# Patient Record
Sex: Female | Born: 1980
Health system: Southern US, Community
[De-identification: ages and names within clinical notes are randomized; demographics above are authoritative.]

## PROBLEM LIST (undated history)

## (undated) DIAGNOSIS — T8859XA Other complications of anesthesia, initial encounter: Secondary | ICD-10-CM

## (undated) DIAGNOSIS — J309 Allergic rhinitis, unspecified: Secondary | ICD-10-CM

## (undated) DIAGNOSIS — T7840XA Allergy, unspecified, initial encounter: Secondary | ICD-10-CM

## (undated) DIAGNOSIS — E119 Type 2 diabetes mellitus without complications: Secondary | ICD-10-CM

## (undated) DIAGNOSIS — F329 Major depressive disorder, single episode, unspecified: Secondary | ICD-10-CM

## (undated) DIAGNOSIS — F411 Generalized anxiety disorder: Secondary | ICD-10-CM

## (undated) DIAGNOSIS — K219 Gastro-esophageal reflux disease without esophagitis: Secondary | ICD-10-CM

## (undated) DIAGNOSIS — D649 Anemia, unspecified: Secondary | ICD-10-CM

## (undated) DIAGNOSIS — N83209 Unspecified ovarian cyst, unspecified side: Secondary | ICD-10-CM

## (undated) DIAGNOSIS — Z8719 Personal history of other diseases of the digestive system: Secondary | ICD-10-CM

## (undated) DIAGNOSIS — M797 Fibromyalgia: Secondary | ICD-10-CM

## (undated) DIAGNOSIS — Z87442 Personal history of urinary calculi: Secondary | ICD-10-CM

## (undated) DIAGNOSIS — G43009 Migraine without aura, not intractable, without status migrainosus: Secondary | ICD-10-CM

## (undated) DIAGNOSIS — I499 Cardiac arrhythmia, unspecified: Secondary | ICD-10-CM

## (undated) DIAGNOSIS — E669 Obesity, unspecified: Secondary | ICD-10-CM

## (undated) DIAGNOSIS — F32A Depression, unspecified: Secondary | ICD-10-CM

## (undated) DIAGNOSIS — F909 Attention-deficit hyperactivity disorder, unspecified type: Secondary | ICD-10-CM

## (undated) DIAGNOSIS — M1990 Unspecified osteoarthritis, unspecified site: Secondary | ICD-10-CM

## (undated) HISTORY — DX: Allergic rhinitis, unspecified: J30.9

## (undated) HISTORY — DX: Gastro-esophageal reflux disease without esophagitis: K21.9

## (undated) HISTORY — DX: Migraine without aura, not intractable, without status migrainosus: G43.009

## (undated) HISTORY — DX: Unspecified ovarian cyst, unspecified side: N83.209

## (undated) HISTORY — DX: Type 2 diabetes mellitus without complications: E11.9

## (undated) HISTORY — DX: Obesity, unspecified: E66.9

## (undated) HISTORY — DX: Depression, unspecified: F32.A

## (undated) HISTORY — DX: Attention-deficit hyperactivity disorder, unspecified type: F90.9

## (undated) HISTORY — DX: Anemia, unspecified: D64.9

## (undated) HISTORY — DX: Allergy, unspecified, initial encounter: T78.40XA

## (undated) HISTORY — DX: Generalized anxiety disorder: F41.1

## (undated) HISTORY — PX: NO PAST SURGERIES: SHX2092

## (undated) HISTORY — DX: Fibromyalgia: M79.7

## (undated) HISTORY — DX: Major depressive disorder, single episode, unspecified: F32.9

---
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
## 2009-11-15 ENCOUNTER — Ambulatory Visit: Payer: Self-pay | Admitting: Internal Medicine

## 2009-11-15 LAB — CONVERTED CEMR LAB
ALT: 16 U/L
AST: 14 U/L
Albumin: 3.4 g/dL — ABNORMAL LOW
Alkaline Phosphatase: 100 U/L
BUN: 11 mg/dL
Basophils Absolute: 0 K/uL
Basophils Relative: 0.4 %
Bilirubin Urine: NEGATIVE
Bilirubin, Direct: 0 mg/dL
CO2: 28 meq/L
Calcium: 9.5 mg/dL
Chloride: 102 meq/L
Cholesterol: 184 mg/dL
Creatinine, Ser: 0.7 mg/dL
Eosinophils Absolute: 0.2 K/uL
Eosinophils Relative: 2.3 %
GFR calc non Af Amer: 103.66 mL/min
Glucose, Bld: 127 mg/dL — ABNORMAL HIGH
HCT: 34.9 % — ABNORMAL LOW
HDL: 78 mg/dL
Hemoglobin, Urine: NEGATIVE
Hemoglobin: 11.4 g/dL — ABNORMAL LOW
Ketones, ur: NEGATIVE mg/dL
LDL Cholesterol: 74 mg/dL
Leukocytes, UA: NEGATIVE
Lymphocytes Relative: 20.9 %
Lymphs Abs: 2.1 K/uL
MCHC: 32.8 g/dL
MCV: 77.1 fL — ABNORMAL LOW
Monocytes Absolute: 0.7 K/uL
Monocytes Relative: 6.9 %
Neutro Abs: 7 K/uL
Neutrophils Relative %: 69.5 %
Nitrite: NEGATIVE
Platelets: 425 K/uL — ABNORMAL HIGH
Potassium: 4 meq/L
RBC: 4.53 M/uL
RDW: 16.1 % — ABNORMAL HIGH
Sodium: 139 meq/L
Specific Gravity, Urine: >=1.03
TSH: 3.04 u[IU]/mL
Total Bilirubin: 0.3 mg/dL
Total CHOL/HDL Ratio: 2
Total Protein, Urine: NEGATIVE mg/dL
Total Protein: 7.3 g/dL
Triglycerides: 158 mg/dL — ABNORMAL HIGH
Urine Glucose: NEGATIVE mg/dL
Urobilinogen, UA: 0.2
VLDL: 31.6 mg/dL
WBC: 10 10*3/microliter
pH: 6

## 2009-11-20 ENCOUNTER — Ambulatory Visit: Payer: Self-pay | Admitting: Internal Medicine

## 2009-11-20 ENCOUNTER — Telehealth: Payer: Self-pay | Admitting: Internal Medicine

## 2009-11-20 DIAGNOSIS — E669 Obesity, unspecified: Secondary | ICD-10-CM | POA: Insufficient documentation

## 2009-11-20 DIAGNOSIS — G43009 Migraine without aura, not intractable, without status migrainosus: Secondary | ICD-10-CM | POA: Insufficient documentation

## 2009-11-20 DIAGNOSIS — Z6830 Body mass index (BMI) 30.0-30.9, adult: Secondary | ICD-10-CM | POA: Insufficient documentation

## 2009-11-20 DIAGNOSIS — E1149 Type 2 diabetes mellitus with other diabetic neurological complication: Secondary | ICD-10-CM | POA: Insufficient documentation

## 2009-11-20 LAB — CONVERTED CEMR LAB
Creatinine,U: 99.7 mg/dL
Hgb A1c MFr Bld: 6.6 % — ABNORMAL HIGH (ref 4.6–6.5)

## 2009-11-21 DIAGNOSIS — J309 Allergic rhinitis, unspecified: Secondary | ICD-10-CM | POA: Insufficient documentation

## 2009-11-21 DIAGNOSIS — N83209 Unspecified ovarian cyst, unspecified side: Secondary | ICD-10-CM | POA: Insufficient documentation

## 2009-11-21 DIAGNOSIS — Z87448 Personal history of other diseases of urinary system: Secondary | ICD-10-CM | POA: Insufficient documentation

## 2010-01-22 ENCOUNTER — Ambulatory Visit: Payer: Self-pay | Admitting: Internal Medicine

## 2010-02-20 ENCOUNTER — Ambulatory Visit: Payer: Self-pay | Admitting: Internal Medicine

## 2010-02-20 DIAGNOSIS — F419 Anxiety disorder, unspecified: Secondary | ICD-10-CM | POA: Insufficient documentation

## 2010-02-20 LAB — CONVERTED CEMR LAB: Hgb A1c MFr Bld: 6.4 % (ref 4.6–6.5)

## 2010-04-04 ENCOUNTER — Ambulatory Visit: Payer: Self-pay | Admitting: Internal Medicine

## 2010-04-22 ENCOUNTER — Telehealth: Payer: Self-pay | Admitting: Internal Medicine

## 2010-05-09 ENCOUNTER — Telehealth: Payer: Self-pay | Admitting: Internal Medicine

## 2010-05-22 ENCOUNTER — Ambulatory Visit: Payer: Self-pay | Admitting: Internal Medicine

## 2010-09-09 ENCOUNTER — Other Ambulatory Visit: Payer: Self-pay | Admitting: Internal Medicine

## 2010-09-09 ENCOUNTER — Ambulatory Visit
Admission: RE | Admit: 2010-09-09 | Discharge: 2010-09-09 | Payer: Self-pay | Source: Home / Self Care | Attending: Internal Medicine | Admitting: Internal Medicine

## 2010-09-09 DIAGNOSIS — L659 Nonscarring hair loss, unspecified: Secondary | ICD-10-CM | POA: Insufficient documentation

## 2010-09-09 LAB — CBC WITH DIFFERENTIAL/PLATELET
Basophils Relative: 0.5 % (ref 0.0–3.0)
Eosinophils Absolute: 0.3 10*3/uL (ref 0.0–0.7)
HCT: 37.8 % (ref 36.0–46.0)
Hemoglobin: 12.6 g/dL (ref 12.0–15.0)
Lymphocytes Relative: 22.5 % (ref 12.0–46.0)
MCHC: 33.4 g/dL (ref 30.0–36.0)
Monocytes Relative: 5.3 % (ref 3.0–12.0)
Neutro Abs: 5.8 10*3/uL (ref 1.4–7.7)
RBC: 4.52 Mil/uL (ref 3.87–5.11)

## 2010-09-09 LAB — HEMOGLOBIN A1C: Hgb A1c MFr Bld: 6.1 % (ref 4.6–6.5)

## 2010-09-10 NOTE — Assessment & Plan Note (Signed)
Summary: ANXIETY/NWS  #   Vital Signs:  Patient profile:   30 year old female Height:      64 inches (162.56 cm) Weight:      184.8 pounds (84 kg) O2 Sat:      97 % on Room air Temp:     98.2 degrees F (36.78 degrees C) oral Pulse rate:   121 / minute BP sitting:   110 / 72  (left arm) Cuff size:   large  Vitals Entered By: Orlan Leavens (February 20, 2010 8:27 AM)  O2 Flow:  Room air CC: Anxiety Is Patient Diabetic? Yes Did you bring your meter with you today? No Pain Assessment Patient in pain? no        Primary Care Provider:  Newt Lukes MD  CC:  Anxiety.  History of Present Illness: here for increase anxiety symptoms - hx same (see below) related to increase stress - dad dx with cancer, separation from spouse and moved in with sister, work denies /si but +palp and insomnia - freq uncontrolled tearfulness - no appetite and assoc weight loss prev intol of zoloft - weight gain; ok on celexa, good with short term xanax and klonopin  also review prior OV/hx: 1) DM2 -  dx 05/2009 - a1c 7.3 then 6.9 after initiating metformin - +FHx - checks cbg at home two times a day: 120-170 range - has met with nutrtion counseling but not making much change - takes meds 100% and no adv SE on metformin or actos  2) obesity - exercise and diet efforts ongoing - 23# weight loss since initiating same -   3) migraines - none since last OV - hx occurs cycles every 3-4 mo, will have several in row then none til next cycle - prev controlled with zomig but changed to maxalt by insurance - no relief with imitrex, doing well with one relpax use since last OV - prev used "cocktail" of darvocet + phenergan when having bad spell - also good relief with tordol - never tried tramadol/ultracet - no n/v or prodromal symptoms - no current symptoms - worse with stress and poor sleep  4) anxiety - usually controls symptoms with counseling and biofeedback - worse with stress from work and family (see above)  - feels much improved with ACEI and lower BP --  Clinical Review Panels:  Lipid Management   Cholesterol:  184 (11/15/2009)   LDL (bad choesterol):  74 (11/15/2009)   HDL (good cholesterol):  78.00 (11/15/2009)  Diabetes Management   HgBA1C:  6.6 (11/20/2009)   Creatinine:  0.7 (11/15/2009)   Last Pneumovax:  Historical (08/11/2008)  CBC   WBC:  10.0 (11/15/2009)   RBC:  4.53 (11/15/2009)   Hgb:  11.4 (11/15/2009)   Hct:  34.9 (11/15/2009)   Platelets:  425.0 (11/15/2009)   MCV  77.1 (11/15/2009)   MCHC  32.8 (11/15/2009)   RDW  16.1 (11/15/2009)   PMN:  69.5 (11/15/2009)   Lymphs:  20.9 (11/15/2009)   Monos:  6.9 (11/15/2009)   Eosinophils:  2.3 (11/15/2009)   Basophil:  0.4 (11/15/2009)  Complete Metabolic Panel   Glucose:  127 (11/15/2009)   Sodium:  139 (11/15/2009)   Potassium:  4.0 (11/15/2009)   Chloride:  102 (11/15/2009)   CO2:  28 (11/15/2009)   BUN:  11 (11/15/2009)   Creatinine:  0.7 (11/15/2009)   Albumin:  3.4 (11/15/2009)   Total Protein:  7.3 (11/15/2009)   Calcium:  9.5 (11/15/2009)   Total  Bili:  0.3 (11/15/2009)   Alk Phos:  100 (11/15/2009)   SGPT (ALT):  16 (11/15/2009)   SGOT (AST):  14 (11/15/2009)   Current Medications (verified): 1)  Tri-Previfem 0.18/0.215/0.25 Mg-35 Mcg Tabs (Norgestim-Eth Estrad Triphasic) 2)  Metformin Hcl 1000 Mg Tabs (Metformin Hcl) .[name].. 1 By Mouth Two Times A Day 3)  Vitamin D3 1000 Unit Tabs (Cholecalciferol) 4)  Relpax 40 Mg Tabs (Eletriptan Hydrobromide) .[name].. 1 By Mouth As Needed For Migraines 5)  Promethazine Hcl 25 Mg Tabs (Promethazine Hcl) .[name].. 1 By Mouth Q 6h As Needed 6)  Triamcinolone Acetonide 0.1 % Crea (Triamcinolone Acetonide) .... Apply 2-3x Prn 7)  Truetest Test  Strp (Glucose Blood) .... Use As Directed Two Times A Day 8)  Actos 15 Mg Tabs (Pioglitazone Hcl) .[name].. 1 By Mouth Once Daily 9)  Lisinopril 10 Mg Tabs (Lisinopril) .[name].. 1 By Mouth Once Daily 10)  Ultracet 37.5-325 Mg Tabs  (Tramadol-Acetaminophen) .[name].. 1 By Mouth Every 6 Houyrs As Needed For Headache Pain 11)  Aspirin 81 Mg Tabs (Aspirin) .... Take 1 By Mouth Once Daily 12)  Iron 325 (65 Fe) Mg Tabs (Ferrous Sulfate) .... 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  The patient denies fever, chest pain, peripheral edema, and headaches.    Physical Exam  General:  overweight-appearing.  alert, well-developed, well-nourished, and cooperative to examination.   sad and tearful Lungs:  normal respiratory effort, no intercostal retractions or use of accessory muscles; normal breath sounds bilaterally - no crackles and no wheezes.    Heart:  normal rate, regular rhythm, no murmur, and no rub. BLE without edema.  Psych:  Oriented X3, memory intact for recent and remote, normally interactive, good eye contact, mod anxious appearing, very depressed appearing, min agitated.      Impression & Recommendations:  Problem # 1:  ANXIETY (ICD-300.00)  long hx same - now exac with situational stressors (dad illness, spouse separation and move, work) resume ssi - pt prefers re-try on celexa ok for as needed bz - xanax daytime and klonopin at bedtime  counseling and support provided - Time spent with patient 25 minutes, more than 50% of this time was spent on same close f/u in next few weeks here and continue attending therapy - Her updated medication list for this problem includes:    Celexa 20 Mg Tabs (Citalopram hydrobromide) .[name]... 1 by mouth once daily    Alprazolam 0.5 Mg  Tabs (Alprazolam) .[name]... 1 by mouth three times a day as needed for anxiety    Clonazepam 0.5 Mg Tabs (Clonazepam) .[name]... 1 by mouth at bedtime as needed for sleep  Orders: Prescription Created Electronically [phone])  Problem # 2:  DIABETES MELLITUS, TYPE II (ICD-250.00)  Her updated medication list for this problem includes:    Metformin Hcl 1000 Mg Tabs (Metformin hcl) .[name]... 1 by mouth two times a day    Actos 15 Mg Tabs (Pioglitazone hcl) .[name]... 1 by mouth once daily    Lisinopril 10 Mg Tabs (Lisinopril) .[name]... 1 by mouth once daily    Aspirin  81 Mg Tabs (Aspirin) .[name]... Take 1 by mouth once daily  home log reviewed -  - no hypoglycemia - cont metformin and actos as ongoing - also cont ACEI lipids ok cont carb counting  as ongoing -  Labs Reviewed: Creat: 0.7 (11/15/2009)    Reviewed HgBA1c results: 6.6 (11/20/2009)  Complete Medication List: 1)  Tri-previfem 0.18/0.215/0.25 Mg-35 Mcg Tabs (Norgestim-eth estrad triphasic) 2)  Metformin Hcl 1000 Mg Tabs (Metformin hcl) .[name].. 1 by mouth two times a day 3)  Vitamin D3 1000 Unit Tabs (Cholecalciferol) 4)  Relpax 40 Mg Tabs (Eletriptan hydrobromide) .[name].. 1 by mouth as needed for migraines 5)  Promethazine Hcl 25 Mg Tabs (Promethazine hcl) .[name].. 1 by mouth q 6h as needed 6)  Triamcinolone Acetonide 0.1 % Crea (Triamcinolone acetonide) .... Apply 2-3x prn 7)  Truetest Test Strp (Glucose blood) .... Use as directed two times a day 8)  Actos 15 Mg Tabs (Pioglitazone hcl) .[name].. 1 by mouth once daily 9)  Lisinopril 10 Mg Tabs (Lisinopril) .[name].. 1 by mouth once daily 10)  Ultracet 37.5-325 Mg Tabs (Tramadol-acetaminophen) .[name].. 1 by mouth every 6 houyrs as needed for headache pain 11)  Aspirin 81 Mg Tabs (Aspirin) .... Take 1 by mouth once daily 12)  Iron 325 (65 Fe) Mg Tabs (Ferrous sulfate) .... Take 1 by mouth once daily 13)  Celexa 20 Mg Tabs (Citalopram hydrobromide) .[name].. 1 by mouth once daily 14)  Alprazolam 0.5 Mg Tabs (Alprazolam) .[name].. 1  by mouth three times a day as needed for anxiety 15)  Clonazepam 0.5 Mg Tabs (Clonazepam) .[name].. 1 by mouth at bedtime as needed for sleep  Patient Instructions: 1)  it was good to see you today. 2)  start generic celexa for anxiety - also xanax and klonopin as discussed - 3)  90day on lisinopril and actose to [facility] - done 4)  Please schedule a follow-up appointment in 4-6 weeks, sooner if problems.  Prescriptions: LISINOPRIL 10 MG TABS (LISINOPRIL) 1 by mouth once daily  #90 x 3   Entered by:   Orlan Leavens   Authorized by:   Newt Lukes MD   Signed by:   Orlan Leavens on 02/20/2010   Method used:   Faxed to ...       [facility] (mail-order)             , [place]         Ph: [phone]       Fax: [phone]   RxID:   [id number] ACTOS 15 MG TABS (PIOGLITAZONE HCL) 1 by mouth once daily  #90 x 3   Entered by:   Orlan Leavens   Authorized by:   Newt Lukes MD   Signed by:   Orlan Leavens on 02/20/2010   Method used:   Faxed to ...       [facility] (mail-order)             , [place]         Ph: [phone]       Fax: [phone]   RxID:   [id number] CLONAZEPAM 0.5 MG TABS (CLONAZEPAM) 1 by mouth at bedtime as needed for sleep  #30 x 1   Entered and Authorized by:   Newt Lukes MD   Signed by:   Newt Lukes MD on 02/20/2010   Method used:   Print then Give to Patient   RxID:   [id number] ALPRAZOLAM 0.5 MG TABS (ALPRAZOLAM) 1 by mouth three  times a day as needed for anxiety  #30 x 1   Entered and Authorized by:   Newt Lukes MD   Signed by:   Newt Lukes MD on 02/20/2010   Method used:   Print then Give to Patient   RxID:   [id number] CELEXA 20 MG TABS (CITALOPRAM HYDROBROMIDE) 1 by mouth once daily  #30 x 1   Entered and Authorized by:   Newt Lukes MD   Signed by:   Newt Lukes MD on 02/20/2010   Method used:   Electronically to        [id number] (retail)       [address]       [place], [place]  [address]       Ph: [phone] or [phone]       Fax: [phone]   RxID:   [id number]

## 2010-09-10 NOTE — Assessment & Plan Note (Signed)
Summary: 4-6 wk fu  stc   Vital Signs:  Patient profile:   30 year old female Height:      64 inches (162.56 cm) Weight:      173.8 pounds (79 kg) O2 Sat:      98 % on Room air Temp:     97.5 degrees F (36.39 degrees C) oral Pulse rate:   96 / minute BP sitting:   100 / 72  (left arm) Cuff size:   large  Vitals Entered By: Orlan Leavens RMA (April 04, 2010 8:38 AM)  O2 Flow:  Room air CC: 4 week follow-up Is Patient Diabetic? Yes Did you bring your meter with you today? No Pain Assessment Patient in pain? no        Primary Care Provider:  Newt Lukes MD  CC:  4 week follow-up.  History of Present Illness: here for follow up anxiety symptoms - hx same (see below) related to increase stress - dad dx with cancer, separation from spouse and moved in with sister, work denies /si but +palp and insomnia - freq uncontrolled tearfulness - no appetite and assoc weight loss prev intol of zoloft - weight gain; ok on celexa, good with short term xanax and klonopin  also review prior OV/hx: 1) DM2 -  dx 05/2009 - a1c 7.3 then 6.9 after initiating metformin - +FHx - checks cbg at home two times a day: 120-170 range - has met with nutrtion counseling but not making much change - takes meds 100% and no adv SE on metformin or actos  2) obesity - exercise and diet efforts ongoing - 23# weight loss since initiating same -   3) migraines - none since last OV - hx occurs cycles every 3-4 mo, will have several in row then none til next cycle - prev controlled with zomig but changed to maxalt by insurance - no relief with imitrex, doing well with one relpax use since last OV - prev used "cocktail" of darvocet + phenergan when having bad spell - also good relief with tordol - never tried tramadol/ultracet - no n/v or prodromal symptoms - no current symptoms - worse with stress and poor sleep  4) anxiety - usually controls symptoms with counseling and biofeedback - worse with stress from  work and family (see above) - feels much improved with ACEI and lower BP --  Clinical Review Panels:  Diabetes Management   HgBA1C:  6.4 (02/20/2010)   Creatinine:  0.7 (11/15/2009)   Last Pneumovax:  Historical (08/11/2008)  CBC   WBC:  10.0 (11/15/2009)   RBC:  4.53 (11/15/2009)   Hgb:  11.4 (11/15/2009)   Hct:  34.9 (11/15/2009)   Platelets:  425.0 (11/15/2009)   MCV  77.1 (11/15/2009)   MCHC  32.8 (11/15/2009)   RDW  16.1 (11/15/2009)   PMN:  69.5 (11/15/2009)   Lymphs:  20.9 (11/15/2009)   Monos:  6.9 (11/15/2009)   Eosinophils:  2.3 (11/15/2009)   Basophil:  0.4 (11/15/2009)  Complete Metabolic Panel   Glucose:  127 (11/15/2009)   Sodium:  139 (11/15/2009)   Potassium:  4.0 (11/15/2009)   Chloride:  102 (11/15/2009)   CO2:  28 (11/15/2009)   BUN:  11 (11/15/2009)   Creatinine:  0.7 (11/15/2009)   Albumin:  3.4 (11/15/2009)   Total Protein:  7.3 (11/15/2009)   Calcium:  9.5 (11/15/2009)   Total Bili:  0.3 (11/15/2009)   Alk Phos:  100 (11/15/2009)   SGPT (ALT):  16 (  11/15/2009)   SGOT (AST):  14 (11/15/2009)   Current Medications (verified): 1)  Tri-Previfem 0.18/0.215/0.25 Mg-35 Mcg Tabs (Norgestim-Eth Estrad Triphasic) 2)  Metformin Hcl 1000 Mg Tabs (Metformin Hcl) .[name].. 1 By Mouth Two Times A Day 3)  Vitamin D3 1000 Unit Tabs (Cholecalciferol) 4)  Relpax 40 Mg Tabs (Eletriptan Hydrobromide) .[name].. 1 By Mouth As Needed For Migraines 5)  Promethazine Hcl 25 Mg Tabs (Promethazine Hcl) .[name].. 1 By Mouth Q 6h As Needed 6)  Triamcinolone Acetonide 0.1 % Crea (Triamcinolone Acetonide) .... Apply 2-3x Prn 7)  Truetest Test  Strp (Glucose Blood) .... Use As Directed Two Times A Day 8)  Actos 15 Mg Tabs (Pioglitazone Hcl) .[name].. 1 By Mouth Once Daily 9)  Lisinopril 10 Mg Tabs (Lisinopril) .[name].. 1 By Mouth Once Daily 10)  Ultracet 37.5-325 Mg Tabs (Tramadol-Acetaminophen) .[name].. 1 By Mouth Every 6 Houyrs As Needed For Headache Pain 11)  Aspirin 81 Mg Tabs (Aspirin) .... Take 1  By Mouth Once Daily 12)  Iron 325 (65 Fe) Mg Tabs (Ferrous Sulfate) .... Take 1 By Mouth Once Daily 13)  Celexa 20 Mg Tabs (Citalopram Hydrobromide) .[name].. 1 By Mouth Once Daily 14)  Alprazolam 0.5 Mg Tabs (Alprazolam) .[name].. 1 By Mouth Three Times A Day As Needed For Anxiety 15)  Clonazepam 0.5 Mg Tabs (Clonazepam) .[name].. 1 By Mouth At Bedtime As Needed For Sleep  Allergies (verified): No Known Drug Allergies  Past History:  Past Medical History: Diabetes mellitus, type II obesity depression/anxiety anemia, mild dyslipidemia migraine headaches GERD   allergic rhinitis  MD roster: gyn- stambaugh -high pt  Review of Systems  The patient denies anorexia, fever, chest pain, and headaches.    Physical Exam  General:  overweight-appearing.  alert, well-developed, well-nourished, and cooperative to examination.   sad and tearful Lungs:  normal respiratory effort, no intercostal retractions or use of accessory muscles; normal breath sounds bilaterally - no crackles and no wheezes.    Heart:  normal rate, regular rhythm, no murmur, and no rub. BLE without edema.  Psych:  Oriented X3, memory intact for recent and remote, normally interactive, good eye contact, mild anxious appearing, mild depressed appearing, min agitated.      Impression & Recommendations:  Problem # 1:  ANXIETY (ICD-300.00)  Her updated medication list for this problem includes:    Celexa 20 Mg Tabs (Citalopram hydrobromide) .[name]... 1 by mouth once daily    Alprazolam 0.5 Mg Tabs (Alprazolam) .[name]... 1 by mouth three times a day as needed for anxiety    Clonazepam 0.5 Mg Tabs (Clonazepam) .[name]... 1 by mouth at bedtime as needed for sleep  long hx same - now exac with situational stressors (dad illness, spouse separation and move, work) improved on resumed celexa - cont same ok to cont as needed bz - xanax daytime and klonopin at bedtime  counseling and support provided -   Problem # 2:  DIABETES MELLITUS, TYPE II  (ICD-250.00)  Her updated medication list for this problem includes:    Metformin Hcl 1000 Mg Tabs (Metformin hcl) .[name]... 1 by mouth two times a day    Actos 15 Mg Tabs (Pioglitazone hcl) .[name]... 1 by mouth once daily    Lisinopril 10 Mg Tabs (Lisinopril) .[name]... 1 by mouth once daily    Aspirin 81 Mg Tabs (Aspirin) .[name]... Take 1 by mouth once daily  home log reviewed -  few hypoglycemia - likely related to dec ontake and wt loss - cont metformin and actos  as ongoing - also cont ACEI lipids ok cont carb counting  Labs Reviewed: Creat: 0.7 (11/15/2009)    Reviewed HgBA1c results: 6.4 (02/20/2010)  6.6 (11/20/2009)  Complete Medication List: 1)  Tri-previfem 0.18/0.215/0.25 Mg-35 Mcg Tabs (Norgestim-eth estrad triphasic) 2)  Metformin Hcl 1000 Mg Tabs (Metformin hcl) .[name].. 1 by mouth two times a day 3)  Vitamin D3 1000 Unit Tabs (Cholecalciferol) 4)  Relpax 40 Mg Tabs (Eletriptan hydrobromide) .[name].. 1 by mouth as needed for migraines 5)  Promethazine Hcl 25 Mg Tabs (Promethazine hcl) .[name].. 1 by mouth q 6h as needed 6)  Triamcinolone Acetonide 0.1 % Crea (Triamcinolone acetonide) .... Apply 2-3x prn 7)  Truetest Test Strp (Glucose blood) .... Use as directed two times a day 8)  Actos 15 Mg Tabs (Pioglitazone hcl) .[name].. 1 by mouth once daily 9)  Lisinopril 10 Mg Tabs (Lisinopril) .[name].. 1 by mouth once daily 10)  Ultracet 37.5-325 Mg Tabs (Tramadol-acetaminophen) .[name].. 1 by mouth every 6 houyrs as needed for headache pain 11)  Aspirin 81 Mg Tabs (Aspirin) .... Take 1 by mouth once daily 12)  Iron 325 (65 Fe) Mg Tabs (Ferrous sulfate) .... Take 1 by mouth once daily 13)  Celexa 20 Mg Tabs (Citalopram hydrobromide) .[name].. 1 by mouth once daily 14)  Alprazolam 0.5 Mg Tabs (Alprazolam) .[name].. 1 by mouth three times a day as needed for anxiety 15)  Clonazepam 0.5 Mg Tabs (Clonazepam) .[name].. 1 by mouth at bedtime as needed for sleep  Patient Instructions: 1)  it was good to see you today. 2)  continue same  dose generic celexa for anxiety - also xanax and klonopin as discussed - 3)  Please keep follow-up appointment as scheduled in october, sooner if problems.  Prescriptions: CLONAZEPAM 0.5 MG TABS (CLONAZEPAM) 1 by mouth at bedtime as needed for sleep  #30 x 1   Entered and Authorized by:   Newt Lukes MD   Signed by:   Newt Lukes MD on 04/04/2010   Method used:   Print then Give to Patient   RxID:   [id number] ALPRAZOLAM 0.5 MG TABS (ALPRAZOLAM) 1 by mouth three times a day as needed for anxiety  #30 x 1   Entered and Authorized by:   Newt Lukes MD   Signed by:   Newt Lukes MD on 04/04/2010   Method used:   Print then Give to Patient   RxID:   [id number] CELEXA 20 MG TABS (CITALOPRAM HYDROBROMIDE) 1 by mouth once daily  #30 x 6   Entered and Authorized by:   Newt Lukes MD   Signed by:   Newt Lukes MD on 04/04/2010   Method used:   Electronically to        [facility] (retail)       [address]       [place], [place]  [address]       Ph: [phone] or [phone]       Fax: [phone]   RxID:   [id number]

## 2010-09-10 NOTE — Progress Notes (Signed)
Summary: Hypoglycemia  Phone Note Call from Patient Call back at Home Phone [phone]   Caller: Mom Summary of Call: Pt called stating that she had a hypoglycemic evant this morningt CBG 64 and pt had a "hadr time moving". Pt says she had some juice and eventually felt better but she would like to know what she can do in the future if this happens again and ways to avoid lows. Please advise. Initial call taken by: Margaret Pyle, CMA,  April 22, 2010 10:53 AM  Follow-up for Phone Call        ask pt to stop actos - likely does not need as much DM medication given her successful weight loss - if recurs, treat herself as she did - juice, snack - and let us know - we will keep backing off DM medication as we are able to do so - thanks Follow-up by: Newt Lukes MD,  April 22, 2010 11:45 AM  Additional Follow-up for Phone Call Additional follow up Details #1::        Pt advised via VM, told to call back with any further questions or concerns Additional Follow-up by: Margaret Pyle, CMA,  April 22, 2010 1:03 PM    New/Updated Medications: ACTOS 15 MG TABS (PIOGLITAZONE HCL) 1 by mouth once daily - HOLD

## 2010-09-10 NOTE — Assessment & Plan Note (Signed)
Summary: 2-3 mth fu  stc   Vital Signs:  Patient profile:   30 year old female Height:      64 inches (162.56 cm) Weight:      194.2 pounds (88.27 kg) O2 Sat:      97 % on Room air Temp:     98.4 degrees F (36.89 degrees C) oral Pulse rate:   104 / minute BP sitting:   102 / 76  (left arm) Cuff size:   large  Vitals Entered By: Orlan Leavens (January 22, 2010 3:36 PM)  O2 Flow:  Room air CC: 2 month follow-up Is Patient Diabetic? Yes Did you bring your meter with you today? No Pain Assessment Patient in pain? no        Primary Care Provider:  Newt Lukes MD  CC:  2 month follow-up.  History of Present Illness: here for f/u  1) DM2 -  dx 05/2009 - a1c 7.3 then 6.9 after initiating metformin - +FHx - checks cbg at home two times a day: 120-170 range - has met with nutrtion counseling but not making much change - takes meds 100% and no adv SE on metformin or actos  2) obesity - exercise and diet efforts ongoing - 23# weight loss since initiating same -   3) migraines - none since last OV - hx occurs cycles every 3-4 mo, will have several in row then none til next cycle - prev controlled with zomig but changed to maxalt by insurance - no relief with imitrex, doing well with one relpax use since last OV - prev used "cocktail" of darvocet + phenergan when having bad spell - also good relief with tordol - never tried tramadol/ultracet - no n/v or prodromal symptoms - no current symptoms - worse with stress and poor sleep  4) anxiety - controls symptoms with counseling and biofeedback - worse with stress from work - feels much improved with ACEI and lower BP --  Clinical Review Panels:  Immunizations   Last Tetanus Booster:  Historical (08/11/1998)   Last Pneumovax:  Historical (08/11/2008)  Lipid Management   Cholesterol:  184 (11/15/2009)   LDL (bad choesterol):  74 (11/15/2009)   HDL (good cholesterol):  78.00 (11/15/2009)  Diabetes Management   HgBA1C:  6.6  (11/20/2009)   Creatinine:  0.7 (11/15/2009)   Last Pneumovax:  Historical (08/11/2008)  CBC   WBC:  10.0 (11/15/2009)   RBC:  4.53 (11/15/2009)   Hgb:  11.4 (11/15/2009)   Hct:  34.9 (11/15/2009)   Platelets:  425.0 (11/15/2009)   MCV  77.1 (11/15/2009)   MCHC  32.8 (11/15/2009)   RDW  16.1 (11/15/2009)   PMN:  69.5 (11/15/2009)   Lymphs:  20.9 (11/15/2009)   Monos:  6.9 (11/15/2009)   Eosinophils:  2.3 (11/15/2009)   Basophil:  0.4 (11/15/2009)  Complete Metabolic Panel   Glucose:  127 (11/15/2009)   Sodium:  139 (11/15/2009)   Potassium:  4.0 (11/15/2009)   Chloride:  102 (11/15/2009)   CO2:  28 (11/15/2009)   BUN:  11 (11/15/2009)   Creatinine:  0.7 (11/15/2009)   Albumin:  3.4 (11/15/2009)   Total Protein:  7.3 (11/15/2009)   Calcium:  9.5 (11/15/2009)   Total Bili:  0.3 (11/15/2009)   Alk Phos:  100 (11/15/2009)   SGPT (ALT):  16 (11/15/2009)   SGOT (AST):  14 (11/15/2009)   Current Medications (verified): 1)  Tri-Previfem 0.18/0.215/0.25 Mg-35 Mcg Tabs (Norgestim-Eth Estrad Triphasic) 2)  Metformin Hcl 1000  Mg Tabs (Metformin Hcl) .[name].. 1 By Mouth Two Times A Day 3)  Vitamin D3 1000 Unit Tabs (Cholecalciferol) 4)  Relpax 40 Mg Tabs (Eletriptan Hydrobromide) .[name].. 1 By Mouth As Needed For Migraines 5)  Promethazine Hcl 25 Mg Tabs (Promethazine Hcl) .[name].. 1 By Mouth Q 6h As Needed 6)  Triamcinolone Acetonide 0.1 % Crea (Triamcinolone Acetonide) .... Apply 2-3x Prn 7)  Truetest Test  Strp (Glucose Blood) .... Use As Directed Two Times A Day 8)  Actos 15 Mg Tabs (Pioglitazone Hcl) .[name].. 1 By Mouth Once Daily 9)  Lisinopril 10 Mg Tabs (Lisinopril) .[name].. 1 By Mouth Once Daily 10)  Ultracet 37.5-325 Mg Tabs (Tramadol-Acetaminophen) .[name].. 1 By Mouth Every 6 Houyrs As Needed For Headache Pain 11)  Aspirin 81 Mg Tabs (Aspirin) .... Take 1 By Mouth Once Daily 12)  Iron 325 (65 Fe) Mg Tabs (Ferrous Sulfate) .... Take 1 By Mouth Once Daily  Allergies (verified): No Known Drug  Allergies  Past History:  Past Medical History: Diabetes mellitus, type II obesity depression/anxiety anemia, mild dyslipidemia migraine headaches GERD    MD rooster: gyn- [facility] [place]  Allergic rhinitis  Review of Systems  The patient denies weight gain, syncope, dyspnea on exertion, and peripheral edema.    Physical Exam  General:  overweight-appearing.  alert, well-developed, well-nourished, and cooperative to examination.    Lungs:  normal respiratory effort, no intercostal retractions or use of accessory muscles; normal breath sounds bilaterally - no crackles and no wheezes.    Heart:  normal rate, regular rhythm, no murmur, and no rub. BLE without edema.  Psych:  Oriented X3, memory intact for recent and remote, normally interactive, good eye contact, mildly anxious appearing, not depressed appearing, and not agitated.      Impression & Recommendations:  Problem # 1:  DIABETES MELLITUS, TYPE II (ICD-250.00)  Her updated medication list for this problem includes:    Metformin Hcl 1000 Mg Tabs (Metformin hcl) .[name]... 1 by mouth two times a day    Actos 15 Mg Tabs (Pioglitazone hcl) .[name]... 1 by mouth once daily    Lisinopril 10 Mg Tabs (Lisinopril) .[name]... 1 by mouth once daily    Aspirin 81 Mg Tabs (Aspirin) .[name]... Take 1 by mouth once daily  home log reviewed -  - no hypoglycemia - cont metformin and actos as ongoing - also cont ACEI lipids ok cont carb counting  as ongoing -  Labs Reviewed: Creat: 0.7 (11/15/2009)    Reviewed HgBA1c results: 6.6 (11/20/2009)  Problem # 2:  MIGRAINE, COMMON (ICD-346.10)  Her updated medication list for this problem includes:    Relpax 40 Mg Tabs (Eletriptan hydrobromide) .[name]... 1 by mouth as needed for migraines    Ultracet 37.5-325 Mg Tabs (Tramadol-acetaminophen) .[name]... 1 by mouth every 6 houyrs as needed for headache pain    Aspirin 81 Mg Tabs (Aspirin) .[name]... Take 1 by mouth once daily  changed darvocet to ultracet to  use in "cocktail" as needed for breakthru pain not relieved by sumatriptans also cont relpax in place of maxalt as zomig not preffered and imitrex ineffective -   Problem # 3:  OBESITY, UNSPECIFIED (ICD-278.00)  Ht: 64 (11/20/2009)   Wt: 199 (11/20/2009)   BMI: 34.28 (11/20/2009)  Ht: 64 (01/22/2010)   Wt: 194.2 (01/22/2010)   BMI: 34.28 (11/20/2009)  Time spent with patient 25) minutes, more than 50% of this time was spent counseling patient on diabetes, weight loss efforts and med review  Complete Medication  List: 1)  Tri-previfem 0.18/0.215/0.25 Mg-35 Mcg Tabs (Norgestim-eth estrad triphasic) 2)  Metformin Hcl 1000 Mg Tabs (Metformin hcl) .[name].. 1 by mouth two times a day 3)  Vitamin D3 1000 Unit Tabs (Cholecalciferol) 4)  Relpax 40 Mg Tabs (Eletriptan hydrobromide) .[name].. 1 by mouth as needed for migraines 5)  Promethazine Hcl 25 Mg Tabs (Promethazine hcl) .[name].. 1 by mouth q 6h as needed 6)  Triamcinolone Acetonide 0.1 % Crea (Triamcinolone acetonide) .... Apply 2-3x prn 7)  Truetest Test Strp (Glucose blood) .... Use as directed two times a day 8)  Actos 15 Mg Tabs (Pioglitazone hcl) .[name].. 1 by mouth once daily 9)  Lisinopril 10 Mg Tabs (Lisinopril) .[name].. 1 by mouth once daily 10)  Ultracet 37.5-325 Mg Tabs (Tramadol-acetaminophen) .[name].. 1 by mouth every 6 houyrs as needed for headache pain 11)  Aspirin 81 Mg Tabs (Aspirin) .... Take 1 by mouth once daily 12)  Iron 325 (65 Fe) Mg Tabs (Ferrous sulfate) .... Take 1 by mouth once daily  Patient Instructions: 1)  it was good to see you today. your numbers look good! 2)  return wed 7/13 for lab only (a1c - 250.00) - you will then be contacted directly with these results within 48h of test completion and notifed if any changes need to be made.  3)  until then, continue Actos and metformin for diabetes - 4)  also continue lisinopril for kidney protection in diabetes - 5)  Please schedule a follow-up appointment in mid October (4 months from  now), call sooner if problems.  6)  Check your blood sugars regularly. If your AM readings are above 120 or below 70 you should contact our office. 7)  It is important that your Diabetic A1c level is checked every 3 months. 8)  See your eye doctor yearly to check for diabetic eye damage.

## 2010-09-10 NOTE — Assessment & Plan Note (Signed)
Summary: NEW [employer] PT PHYSICAL-PKG-#-STC   Vital Signs:  Patient profile:   30 year old female Height:      64 inches (162.56 cm) Weight:      199 pounds (90.45 kg) BMI:     34.28 O2 Sat:      98 % on Room air Temp:     98.3 degrees F (36.83 degrees C) oral Pulse rate:   130 / minute BP sitting:   150 / 100  (left arm) Cuff size:   regular  Vitals Entered By: Lucious Groves (November 20, 2009 2:51 PM)  O2 Flow:  Room air CC: NP--CPX. Pt would like to discuss increased blood sugar (diagnosed in october) and urinary frequency./kb Is Patient Diabetic? Yes Did you bring your meter with you today? No Pain Assessment Patient in pain? no      Comments Patient will most likely need darvocet replacement./kb   Primary Care Provider:  Newt Lukes MD  CC:  NP--CPX. Pt would like to discuss increased blood sugar (diagnosed in october) and urinary frequency./kb.  History of Present Illness: new pt to me and our practice - here to est care - [facility] [place] [facility] [place] [facility]  patient is here today for annual physical. Patient feels well and has no complaints.   would like to review chronic medical issues as well:  1) DM2 - newly dx 05/2009 - a1c 7.3 then 6.9 after initiating metformin - +FHx - checks cbg at home two times a day: 120-170 range - has met with nutrtion counseling but not making much change - complaint with meds 100% and no adv SE on metformin  2) obesity - exercise and diet efforts ongoing - 18# weight loss since initiating same -   3) migraines - occurs cycles every 3-4 mo, will have several in row then noe til net cycle - controlled with zomig but changed to maxalt by insurance - no relief with imitrex - will use "cocktail" of darvocet + phenergan when having bas spell - also good relief with tordol - never tried tramadol/ultracet - no n/v or prodromal symptoms - no current symptoms - worse with stress and poor sleep  4) anxiety - controls  symptoms with counseling and biofeedback - worse with stress from work  Preventive Screening-Counseling & Management  Alcohol-Tobacco     Alcohol drinks/day: <1     Alcohol Counseling: not indicated; use of alcohol is not excessive or problematic     Smoking Status: never     Tobacco Counseling: not to resume use of tobacco products  Caffeine-Diet-Exercise     Does Patient Exercise: yes     Exercise Counseling: not indicated; exercise is adequate     Depression Counseling: not indicated; screening negative for depression  Safety-Violence-Falls     Seat Belt Use: yes     Seat Belt Counseling: not indicated; patient wears seat belts     Helmet Use: n/a     Firearms in the Home: firearms in the home     Firearm Counseling: not indicated; uses recommended firearm safety measures     Smoke Detectors: yes     Smoke Detector Counseling: n/a     Violence in the Home: no risk noted     Sexual Abuse: no     Fall Risk Counseling: not indicated; no significant falls noted      Drug Use:  no.    Clinical Review Panels:  Prevention   Last Mammogram:  No specific mammographic evidence of malignancy.   (10/09/2008)  Immunizations   Last Tetanus Booster:  Historical (08/11/1998)   Last Pneumovax:  Historical (08/11/2008)  Lipid Management   Cholesterol:  184 (11/15/2009)   LDL (bad choesterol):  74 (11/15/2009)   HDL (good cholesterol):  78.00 (11/15/2009)  CBC   WBC:  10.0 (11/15/2009)   RBC:  4.53 (11/15/2009)   Hgb:  11.4 (11/15/2009)   Hct:  34.9 (11/15/2009)   Platelets:  425.0 (11/15/2009)   MCV  77.1 (11/15/2009)   MCHC  32.8 (11/15/2009)   RDW  16.1 (11/15/2009)   PMN:  69.5 (11/15/2009)   Lymphs:  20.9 (11/15/2009)   Monos:  6.9 (11/15/2009)   Eosinophils:  2.3 (11/15/2009)   Basophil:  0.4 (11/15/2009)  Complete Metabolic Panel   Glucose:  127 (11/15/2009)   Sodium:  139 (11/15/2009)   Potassium:  4.0 (11/15/2009)   Chloride:  102 (11/15/2009)   CO2:  28  (11/15/2009)   BUN:  11 (11/15/2009)   Creatinine:  0.7 (11/15/2009)   Albumin:  3.4 (11/15/2009)   Total Protein:  7.3 (11/15/2009)   Calcium:  9.5 (11/15/2009)   Total Bili:  0.3 (11/15/2009)   Alk Phos:  100 (11/15/2009)   SGPT (ALT):  16 (11/15/2009)   SGOT (AST):  14 (11/15/2009)   Current Medications (verified): 1)  Tri-Previfem 0.18/0.215/0.25 Mg-35 Mcg Tabs (Norgestim-Eth Estrad Triphasic) 2)  Metformin Hcl 1000 Mg Tabs (Metformin Hcl) .[name].. 1 By Mouth Bid 3)  Vitamin D3 1000 Unit Tabs (Cholecalciferol) 4)  Maxalt 10 Mg Tabs (Rizatriptan Benzoate) .... Prn 5)  Promethazine Hcl 25 Mg Tabs (Promethazine Hcl) .[name].. 1 By Mouth Q 6h As Needed 6)  Triamcinolone Acetonide 0.1 % Crea (Triamcinolone Acetonide) .... Apply 2-3x Prn 7)  Truetest Test  Strp (Glucose Blood) .... 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 I have reviewed all other systems and they were negative.   Physical Exam  General:  overweight-appearing.  alert, well-developed, well-nourished, and cooperative to examination.    Eyes:  vision grossly intact; pupils equal, round and reactive to light.  conjunctiva and lids normal.    Ears:  normal pinnae  bilaterally, without erythema, swelling, or tenderness to palpation. TMs clear, without effusion, or cerumen impaction. Hearing grossly normal bilaterally  Mouth:  teeth and gums in good repair; mucous membranes moist, without lesions or ulcers. oropharynx clear without exudate, no erythema.  Neck:  supple, full ROM, no masses, no thyromegaly; no thyroid nodules or tenderness. no JVD or carotid bruits.   Lungs:  normal respiratory effort, no intercostal retractions or use of accessory muscles; normal breath sounds bilaterally - no crackles and no wheezes.    Heart:  normal rate, regular rhythm, no murmur, and no rub. BLE without edema.  Abdomen:  soft, non-tender, normal bowel sounds, no distention; no masses and no appreciable hepatomegaly or splenomegaly.   Genitalia:  defer gyn Msk:  No deformity or scoliosis noted of thoracic or lumbar spine.   Neurologic:  alert & oriented X3 and cranial nerves II-XII symetrically intact.  strength normal in all extremities, sensation intact to light touch, and gait normal. speech fluent without dysarthria or aphasia; follows commands with good comprehension.  Skin:  no rashes, vesicles, ulcers, or erythema. No nodules or irregularity to palpation. mild telectasia over both cheecks Psych:  Oriented X3, memory intact for recent and remote, normally interactive, good eye contact, mildly anxious appearing, not depressed appearing, and not agitated.      Impression & Recommendations:  Problem # 1:  DIABETES MELLITUS, TYPE II (ICD-250.00) home log reviewed - uncontrolled by hx - no hypoglycemia - cont metformin as ongoing - add Actos 15 once daily -  also add ACEI lipids ok check dm (not done as part of physcial)  labs - cont carb counting  as ongoing - Her updated medication list for this problem includes:    Metformin Hcl 1000 Mg Tabs (Metformin hcl) .[name]... 1 by mouth two times a day    Actos 15 Mg Tabs (Pioglitazone hcl) .[name]... 1 by mouth once daily     Lisinopril 10 Mg Tabs (Lisinopril) .[name]... 1 by mouth once daily  Orders: Prescription Created Electronically [id number]) TLB-A1C / Hgb A1C (Glycohemoglobin) (83036-A1C) TLB-Microalbumin/Creat Ratio, Urine (82043-MALB)  Problem # 2:  ROUTINE GENERAL MEDICAL EXAM@HEALTH  CARE FACL (ICD-V70.0) Patient has been counseled on age-appropriate routine health concerns for screening and prevention. These are reviewed and up-to-date. Immunizations are up-to-date or declined. Labs reviewed.   Problem # 3:  OBESITY, UNSPECIFIED (ICD-278.00)  Ht: 64 (11/20/2009)   Wt: 199 (11/20/2009)   BMI: 34.28 (11/20/2009)  Problem # 4:  MIGRAINE, COMMON (ICD-346.10)  change darvocet to ultracet to use in "cocktail" as needed for breakthru pain not relieved by sumatriptans also try relpax in place of maxalt as zomig not preffered and imitrex ineffective -  Her updated medication list for this problem includes:    Relpax 40 Mg Tabs (Eletriptan hydrobromide) .[name]... 1 by mouth as needed for migraines    Ultracet 37.5-325 Mg Tabs (Tramadol-acetaminophen) .[name]... 1 by mouth every 6 houyrs as needed for headache pain  Orders: Prescription Created Electronically [id number])  Complete Medication List: 1)  Tri-previfem 0.18/0.215/0.25 Mg-35 Mcg Tabs (Norgestim-eth estrad triphasic) 2)  Metformin Hcl 1000 Mg Tabs (Metformin hcl) .[name].. 1 by mouth two times a day 3)  Vitamin D3 1000 Unit Tabs (Cholecalciferol) 4)  Relpax 40 Mg Tabs (Eletriptan hydrobromide) .[name].. 1 by mouth as needed for migraines 5)  Promethazine Hcl 25 Mg Tabs (Promethazine hcl) .[name].. 1 by mouth q 6h as needed 6)  Triamcinolone Acetonide 0.1 % Crea (Triamcinolone acetonide) .... Apply 2-3x prn 7)  Truetest Test Strp (Glucose blood) .... Use as directed two times a day 8)  Actos 15 Mg Tabs (Pioglitazone hcl) .[name].. 1 by mouth once daily 9)  Lisinopril 10 Mg Tabs (Lisinopril) .[name].. 1 by mouth once daily 10)  Ultracet 37.5-325 Mg Tabs (Tramadol-acetaminophen) .[name].. 1 by  mouth every 6 houyrs as needed for headache pain  Patient Instructions: 1)  it was good to see you today. 2)  test(s) ordered today - your results will be posted on the phone tree for review in 48-72 hours from the time of test completion; call [phone] and enter your 9 digit MRN (listed above  on this page, just below your name); if any changes need to be made or there are abnormal results, you will be contacted directly.  3)  start Actos for diabetes - take in addition to metformin as ongoing 4)  also start lisinopril for kidney protection in diabetes - 5)  your prescriptions have been electronically submitted to your pharmacy. Please take as directed. Contact our office if you believe you're having problems with the medication(s).  3 month request sent to [facility] for metformin and test strips 6)  Please schedule a follow-up appointment in 2-3 months to review medications, sooner if problems.  7)  Check your blood sugars regularly. If your readings are usually above 150 or below 70 you should contact our office. 8)  It is important that your Diabetic A1c level is checked every 3 months. 9)  See your eye doctor yearly to check for diabetic eye damage. Prescriptions: RELPAX 40 MG TABS (ELETRIPTAN HYDROBROMIDE) 1 by mouth as needed for migraines  #12 x 1   Entered and Authorized by:   Newt Lukes MD   Signed by:   Newt Lukes MD on 11/20/2009   Method used:   Electronically to        [facility] (retail)       [address]       [place], [place]  [address]       Ph: [phone] or [phone]       Fax: [phone]   RxID:   [id number] ULTRACET 37.5-325 MG TABS (TRAMADOL-ACETAMINOPHEN) 1 by mouth every 6 houyrs as needed for headache pain  #40 x 2   Entered and Authorized by:   Newt Lukes MD   Signed by:   Newt Lukes MD on 11/20/2009   Method used:   Electronically to        [facility] (retail)        [address]       [place], [place]  [address]       Ph: [phone] or [phone]       Fax: [phone]   RxID:   [id number] LISINOPRIL 10 MG TABS (LISINOPRIL) 1 by mouth once daily  #30 x 2   Entered and Authorized by:   Newt Lukes MD   Signed by:   Newt Lukes MD on 11/20/2009   Method used:   Electronically to        [facility] (retail)       [address]       [place], [place]  [address]       Ph: [phone] or [phone]       Fax: [phone]   RxID:   [id number] ACTOS 15 MG TABS (PIOGLITAZONE HCL) 1 by mouth once daily  #30 x 2   Entered and Authorized by:   Newt Lukes MD   Signed by:   Newt Lukes MD on 11/20/2009   Method used:   Electronically to        [facility] (retail)       [address]       [place], [place]  [address]  Ph: [phone] or [phone]       Fax: [phone]   RxID:   [id number] METFORMIN HCL 1000 MG TABS (METFORMIN HCL) 1 by mouth two times a day  #180 x 3   Entered and Authorized by:   Newt Lukes MD   Signed by:   Newt Lukes MD on 11/20/2009   Method used:   Electronically to        [facility]* (mail-order)             ,          Ph: [phone]       Fax: [phone]   RxID:   [id number] TRUETEST TEST  STRP (GLUCOSE BLOOD) use as directed two times a day  #3 months x 3   Entered and Authorized by:   Newt Lukes MD   Signed by:   Newt Lukes MD on 11/20/2009   Method used:   Electronically to        [facility]* (mail-order)             ,          Ph: [phone]       Fax: [phone]   RxID:   [id number]   Preventive Care Screening  Last Tetanus Booster:    Date:  08/11/1998    Results:  Historical     Mammogram  Procedure date:  10/09/2008  Findings:      No specific mammographic evidence of malignancy.       Immunization History:  Pneumovax Immunization History:    Pneumovax:  historical (08/11/2008)    Orders Added: 1)  New Patient 18-39 years [99385] 2)  Prescription Created Electronically [[id number]] 3)  New Patient Level III [99203] 4)  TLB-A1C / Hgb A1C (Glycohemoglobin) [83036-A1C] 5)  TLB-Microalbumin/Creat Ratio, Urine [82043-MALB]

## 2010-09-10 NOTE — Assessment & Plan Note (Signed)
Summary: mid oct fu stc   Primary Care Provider:  Newt Lukes MD   History of Present Illness: here for follow up   anxiety symptoms - hx same -usually controls symptoms with counseling and biofeedback -  related to increase stress - dad dx with cancer, separation from spouse and moved in with sister, work denies si but +palp and insomnia - freq uncontrolled tearfulness - no appetite and assoc weight loss prev intol of zoloft - weight gain;  ok on celexa, good with short term xanax and klonopin  DM2 -  dx 05/2009 - a1c 7.3 then 6.9 after initiating metformin - +FHx - checks cbg at home two times a day: 120-170 range - has met with nutrtion counseling but not making much change - takes meds 100% and no adv SE on metformin  - off actos since mid 04/2010 due to hypoglycemia, mild  hx obesity - exercise and diet efforts ongoing - >50# weight loss since initiating same 05/2009-   migraines - hx occurs cycles every 3-4 mo, will have several in row then none til next cycle - prev controlled with zomig but changed to maxalt by insurance - no relief with imitrex, doing well with one relpax use since last OV - prev used "cocktail" of darvocet + phenergan when having bad spell - also good relief with tordol - never tried tramadol/ultracet - no n/v or prodromal symptoms - no current symptoms - worse with stress and poor sleep   Clinical Review Panels:  Immunizations   Last Tetanus Booster:  Historical (08/11/1998)   Last Pneumovax:  Historical (08/11/2008)  Lipid Management   Cholesterol:  184 (11/15/2009)   LDL (bad choesterol):  74 (11/15/2009)   HDL (good cholesterol):  78.00 (11/15/2009)  Diabetes Management   HgBA1C:  6.4 (02/20/2010)   Creatinine:  0.7 (11/15/2009)   Last Pneumovax:  Historical (08/11/2008)  CBC   WBC:  10.0 (11/15/2009)   RBC:  4.53 (11/15/2009)   Hgb:  11.4 (11/15/2009)   Hct:  34.9 (11/15/2009)   Platelets:  425.0 (11/15/2009)   MCV  77.1  (11/15/2009)   MCHC  32.8 (11/15/2009)   RDW  16.1 (11/15/2009)   PMN:  69.5 (11/15/2009)   Lymphs:  20.9 (11/15/2009)   Monos:  6.9 (11/15/2009)   Eosinophils:  2.3 (11/15/2009)   Basophil:  0.4 (11/15/2009)  Complete Metabolic Panel   Glucose:  127 (11/15/2009)   Sodium:  139 (11/15/2009)   Potassium:  4.0 (11/15/2009)   Chloride:  102 (11/15/2009)   CO2:  28 (11/15/2009)   BUN:  11 (11/15/2009)   Creatinine:  0.7 (11/15/2009)   Albumin:  3.4 (11/15/2009)   Total Protein:  7.3 (11/15/2009)   Calcium:  9.5 (11/15/2009)   Total Bili:  0.3 (11/15/2009)   Alk Phos:  100 (11/15/2009)   SGPT (ALT):  16 (11/15/2009)   SGOT (AST):  14 (11/15/2009)   Allergies: No Known Drug Allergies  Past History:  Past Medical History: Diabetes mellitus, type II obesity, hx depression/anxiety anemia, mild dyslipidemia migraine headaches GERD   allergic rhinitis  MD roster:   gyn- stambaugh -[facility] pt counseling - diane loyd ([facility])  Review of Systems  The patient denies anorexia, fever, weight gain, chest pain, syncope, peripheral edema, and headaches.    Physical Exam  General:  overweight-appearing.  alert, well-developed, well-nourished, and cooperative to examination.  Ears:  hazy R TM - otherwise normal, no effusion or erythema Mouth:  teeth and gums in good repair;  mucous membranes moist, without lesions or ulcers. oropharynx clear without exudate, no erythema.  Lungs:  normal respiratory effort, no intercostal retractions or use of accessory muscles; normal breath sounds bilaterally - no crackles and no wheezes.    Heart:  normal rate, regular rhythm, no murmur, and no rub. BLE without edema.    Impression & Recommendations:  Problem # 1:  DIABETES MELLITUS, TYPE II (ICD-250.00)  The following medications were removed from the medication list:    Actos 15 Mg Tabs (Pioglitazone hcl) .[name]... 1 by mouth once daily - hold Her updated medication list for this problem  includes:    Metformin Hcl 1000 Mg Tabs (Metformin hcl) .[name]... 1 by mouth two times a day    Lisinopril 10 Mg Tabs (Lisinopril) .[name]... 1 by mouth once daily    Aspirin 81 Mg Tabs (Aspirin) .[name]... Take 1 by mouth once daily  Orders: TLB-A1C / Hgb A1C (Glycohemoglobin) (83036-A1C)  home log reviewed -  off actos with resolved hypoglycemia - l ikely related to ongoing wt loss - cont metformin as ongoing - also cont ACEI lipids ok cont carb counting  Labs Reviewed: Creat: 0.7 (11/15/2009)    Reviewed HgBA1c results: 6.4 (02/20/2010)  6.6 (11/20/2009)  Problem # 2:  ALLERGIC RHINITIS (ICD-477.9)  Her updated medication list for this problem includes:    Promethazine Hcl 25 Mg Tabs (Promethazine hcl) .[name]... 1 by mouth q 6h as needed    Nasonex 50 Mcg/act Susp (Mometasone furoate) .[name]... 1 spray each nostril every morning  Orders: Prescription Created Electronically [phone])  Discussed use of allergy medications and environmental measures.   Complete Medication List: 1)  Tri-previfem 0.18/0.215/0.25 Mg-35 Mcg Tabs (Norgestim-eth estrad triphasic) 2)  Metformin Hcl 1000 Mg Tabs (Metformin hcl) .[name].. 1 by mouth two times a day 3)  Vitamin D3 1000 Unit Tabs (Cholecalciferol) 4)  Relpax 40 Mg Tabs (Eletriptan hydrobromide) .[name].. 1 by mouth as needed for migraines 5)  Promethazine Hcl 25 Mg Tabs (Promethazine hcl) .[name].. 1 by mouth q 6h as needed 6)  Triamcinolone Acetonide 0.1 % Crea (Triamcinolone acetonide) .... Apply 2-3x prn 7)  Truetest Test Strp (Glucose blood) .... Use as directed two times a day 8)  Lisinopril 10 Mg Tabs (Lisinopril) .[name].. 1 by mouth once daily 9)  Ultracet 37.5-325 Mg Tabs (Tramadol-acetaminophen) .[name].. 1 by mouth every 6 houyrs as needed for headache pain 10)  Aspirin 81 Mg Tabs (Aspirin) .... Take 1 by mouth once daily 11)  Iron 325 (65 Fe) Mg Tabs (Ferrous sulfate) .... Take 1 by mouth once daily 12)  Celexa 20 Mg Tabs (Citalopram hydrobromide) .[name].. 1 by mouth once  daily 13)  Alprazolam 0.5 Mg Tabs (Alprazolam) .[name].. 1 by mouth three times a day as needed for anxiety 14)  Clonazepam 0.5 Mg Tabs (Clonazepam) .[name].. 1 by mouth at bedtime as needed for sleep 15)  Nasonex 50 Mcg/act Susp (Mometasone furoate) .[name].. 1 spray each nostril every morning  Patient Instructions: 1)  it was good to see you today. 2)  offically stop actos - continue metformin same dose for now 3)  test(s) ordered today - your results will be posted on the phone tree for review in 48-72 hours from the time of test completion; call [phone] and enter your 9 digit MRN (listed above on this page, just below your name); if any changes need to be made or there are abnormal results, you will be contacted directly.  4)  start nasal steroids (nasonex) - for  allergy and ear symptoms - your prescription has been electronically submitted to your pharmacy. Please take as directed. Contact our office if you believe you're having problems with the medication(s).  5)  Please schedule a follow-up appointment in 3 motnsh to monitor diabetes and weight, call sooner if problems.  Prescriptions: NASONEX 50 MCG/ACT SUSP (MOMETASONE FUROATE) 1 spray each nostril every morning  #1 x 3   Entered and Authorized by:   Newt Lukes MD   Signed by:   Newt Lukes MD on 05/22/2010   Method used:   Electronically to        [facility] [address] (retail)       [address]       [place], [place]  [address]       Ph: [phone] or [phone]       Fax: [phone]   RxID:   [id number]

## 2010-09-10 NOTE — Progress Notes (Signed)
Summary: lab results and new migraine meds e-rx'd  Phone Note Outgoing Call   Summary of Call: please let pt know:   1) her a1c is now 6.6 - still ok to start low dose actos as rx'd at OV yesterday in addition to metformin -  2) also cont lisinopril for renal protection because urine test did show trace of protein -  3) new rx for ultram and relpax have been e-rx to her local pharmacy to use as needed for migraines in place of darvocet and maxalt respectively (discussed possible changes in these meds at OV yesterday)- thanks Initial call taken by: Newt Lukes MD,  November 20, 2009 9:18 PM  Follow-up for Phone Call        left message on machine for pt to return my call. Margaret Pyle, CMA  November 21, 2009 11:44 AM   Additional Follow-up for Phone Call Additional follow up Details #1::        pt informed Additional Follow-up by: Margaret Pyle, CMA,  November 21, 2009 3:40 PM

## 2010-09-10 NOTE — Progress Notes (Signed)
Summary: citalopram  Phone Note Refill Request Message from:  Fax from Pharmacy on May 09, 2010 8:33 AM  Refills Requested: Medication #1:  CELEXA 20 MG TABS 1 by mouth once daily # 90 [facility] [phone]  Initial call taken by: Orlan Leavens RMA,  May 09, 2010 8:34 AM    Prescriptions: CELEXA 20 MG TABS (CITALOPRAM HYDROBROMIDE) 1 by mouth once daily  #90 x 1   Entered by:   Orlan Leavens RMA   Authorized by:   Newt Lukes MD   Signed by:   Orlan Leavens RMA on 05/09/2010   Method used:   Faxed to ...       [facility]* (retail)             ,          Ph: [phone]       Fax: [phone]   RxID:   [id number]

## 2010-09-18 NOTE — Assessment & Plan Note (Signed)
Summary: 3 MTH FU---STC   Vital Signs:  Patient profile:   30 year old female Height:      64 inches (162.56 cm) Weight:      173.6 pounds (78.91 kg) BMI:     29.91 O2 Sat:      98 % on Room air Temp:     98.8 degrees F (37.11 degrees C) oral Pulse rate:   95 / minute BP sitting:   85 / 52  (left arm) Cuff size:   regular  Vitals Entered By: Orlan Leavens RMA (September 09, 2010 9:11 AM)  O2 Flow:  Room air CC: 3 month follow-up Is Patient Diabetic? Yes Did you bring your meter with you today? No Pain Assessment Patient in pain? no      Comments Want to discuss alprazolam & clonazepam   Primary Care Provider:  Newt Lukes MD  CC:  3 month follow-up.  History of Present Illness: here for follow up   anxiety symptoms - hx same -usually controls symptoms with counseling and biofeedback -  related to increase stress - dad dx with cancer, separation from spouse and moved in with sister, work denies si but +palp and insomnia - freq uncontrolled tearfulness - no appetite and assoc weight loss prev intol of zoloft - weight gain;  ok on celexa, good with short term xanax and klonopin  DM2 -  dx 05/2009 - a1c 7.3 then 6.9 after initiating metformin - +FHx - checks cbg at home two times a day: 120-170 range - has met with nutrtion counseling but not making much change - takes meds 100% and no adv SE on metformin  - off actos since mid 04/2010 due to hypoglycemia, mild; reduced metformin 05/2010 due to good control  hx obesity - exercise and diet efforts ongoing - >50# weight loss since initiating same 05/2009-   migraines - hx occurs cycles every 3-4 mo, will have several in row then none til next cycle - prev controlled with zomig but changed to maxalt by insurance - no relief with imitrex, doing well with one relpax use since last OV - prev used "cocktail" of darvocet + phenergan when having bad spell - also good relief with tordol - never tried tramadol/ultracet - no n/v or  prodromal symptoms - no current symptoms - worse with stress and poor sleep   Clinical Review Panels:  Diabetes Management   HgBA1C:  6.0 (05/22/2010)   Creatinine:  0.7 (11/15/2009)   Last Pneumovax:  Historical (08/11/2008)  CBC   WBC:  10.0 (11/15/2009)   RBC:  4.53 (11/15/2009)   Hgb:  11.4 (11/15/2009)   Hct:  34.9 (11/15/2009)   Platelets:  425.0 (11/15/2009)   MCV  77.1 (11/15/2009)   MCHC  32.8 (11/15/2009)   RDW  16.1 (11/15/2009)   PMN:  69.5 (11/15/2009)   Lymphs:  20.9 (11/15/2009)   Monos:  6.9 (11/15/2009)   Eosinophils:  2.3 (11/15/2009)   Basophil:  0.4 (11/15/2009)  Complete Metabolic Panel   Glucose:  127 (11/15/2009)   Sodium:  139 (11/15/2009)   Potassium:  4.0 (11/15/2009)   Chloride:  102 (11/15/2009)   CO2:  28 (11/15/2009)   BUN:  11 (11/15/2009)   Creatinine:  0.7 (11/15/2009)   Albumin:  3.4 (11/15/2009)   Total Protein:  7.3 (11/15/2009)   Calcium:  9.5 (11/15/2009)   Total Bili:  0.3 (11/15/2009)   Alk Phos:  100 (11/15/2009)   SGPT (ALT):  16 (11/15/2009)   SGOT (  AST):  14 (11/15/2009)   Current Medications (verified): 1)  Tri-Previfem 0.18/0.215/0.25 Mg-35 Mcg Tabs (Norgestim-Eth Estrad Triphasic) 2)  Metformin Hcl 1000 Mg Tabs (Metformin Hcl) .... 1/2 Tab By Mouth Two Times A Day 3)  Vitamin D3 1000 Unit Tabs (Cholecalciferol) 4)  Relpax 40 Mg Tabs (Eletriptan Hydrobromide) .[name].. 1 By Mouth As Needed For Migraines 5)  Promethazine Hcl 25 Mg Tabs (Promethazine Hcl) .[name].. 1 By Mouth Q 6h As Needed 6)  Triamcinolone Acetonide 0.1 % Crea (Triamcinolone Acetonide) .... Apply 2-3x Prn 7)  Truetest Test  Strp (Glucose Blood) .... Use As Directed Two Times A Day 8)  Lisinopril 10 Mg Tabs (Lisinopril) .[name].. 1 By Mouth Once Daily 9)  Ultracet 37.5-325 Mg Tabs (Tramadol-Acetaminophen) .[name].. 1 By Mouth Every 6 Houyrs As Needed For Headache Pain 10)  Aspirin 81 Mg Tabs (Aspirin) .... Take 1 By Mouth Once Daily 11)  Iron 325 (65 Fe) Mg Tabs (Ferrous  Sulfate) .... Take 1 By Mouth Once Daily 12)  Celexa 20 Mg Tabs (Citalopram Hydrobromide) .[name].. 1 By Mouth Once Daily 13)  Alprazolam 0.5 Mg Tabs (Alprazolam) .[name].. 1 By Mouth Three Times A Day As Needed For Anxiety 14)  Clonazepam 0.5 Mg Tabs (Clonazepam) .[name].. 1 By Mouth At Bedtime As Needed For Sleep 15)  Nasonex 50 Mcg/act Susp (Mometasone Furoate) .[name].. 1 Spray Each Nostril Every Morning  Allergies (verified): No Known Drug Allergies  Past History:  Past Medical History: Diabetes mellitus, type II obesity, hx depression/anxiety anemia, mild dyslipidemia migraine headaches GERD   allergic rhinitis  MD roster:   gyn- stambaugh -high pt  counseling - diane loyd ([facility])  Review of Systems  The patient denies anorexia, weight loss, chest pain, and headaches.    Physical Exam  General:  overweight-appearing.  alert, well-developed, well-nourished, and cooperative to examination.  Lungs:  normal respiratory effort, no intercostal retractions or use of accessory muscles; normal breath sounds bilaterally - no crackles and no wheezes.    Heart:  normal rate, regular rhythm, no murmur, and no rub. BLE without edema.  Skin:  thinning scalp hair diffuse w/o inflammtion or alopecia rings Psych:  Oriented X3, memory intact for recent and remote, normally interactive, good eye contact, mild anxious appearing, mild depressed appearing, not agitated.      Impression & Recommendations:  Problem # 1:  HAIR LOSS (ICD-704.00) mild, diffuse - ?stress or meds - check lans now - reassurance Orders: TLB-CBC Platelet - w/Differential (85025-CBCD) TLB-TSH (Thyroid Stimulating Hormone) (84443-TSH)  Problem # 2:  DIABETES MELLITUS, TYPE II (ICD-250.00)  Her updated medication list for this problem includes:    Metformin Hcl 1000 Mg Tabs (Metformin hcl) .[name]... 1/2 tab by mouth two times a day    Lisinopril 2.5 Mg Tabs (Lisinopril) .[name]... 1 by mouth once daily    Aspirin 81 Mg Tabs  (Aspirin) .[name]... Take 1 by mouth once daily  Orders: TLB-A1C / Hgb A1C (Glycohemoglobin) (83036-A1C) TLB-Creatinine, Blood (82565-CREA) Prescription Created Electronically [id number])  home log reviewed -  off actos with resolved hypoglycemia -  likely related to ongoing wt loss - cont metformin as ongoing - dose reduced 05/2010 and off actos since summer 2011 also cont ACEI but reduce dose due to low BP - new erx done lipids ok cont carb counting  Labs Reviewed: Creat: 0.7 (11/15/2009)    Reviewed HgBA1c results: 6.0 (05/22/2010)  6.4 (02/20/2010)  Problem # 3:  ANXIETY (ICD-300.00)  The following medications were removed from the medication list:  Clonazepam 0.5 Mg Tabs (Clonazepam) .[name]... 1 by mouth at bedtime as needed for sleep Her updated medication list for this problem includes:    Celexa 20 Mg Tabs (Citalopram hydrobromide) .[name]... 1 by mouth once daily    Alprazolam 0.5 Mg Tabs (Alprazolam) .[name]... 1 by mouth three times a day as needed for anxiety  long hx same - now exac with situational stressors (dad illness, spouse separation and move, work) improved on resumed celexa - cont same ok to cont as needed bz - xanax daytime - new rx done counseling and support provided -   Complete Medication List: 1)  Tri-previfem 0.18/0.215/0.25 Mg-35 Mcg Tabs (Norgestim-eth estrad triphasic) 2)  Metformin Hcl 1000 Mg Tabs (Metformin hcl) .... 1/2 tab by mouth two times a day 3)  Vitamin D3 1000 Unit Tabs (Cholecalciferol) 4)  Relpax 40 Mg Tabs (Eletriptan hydrobromide) .[name].. 1 by mouth as needed for migraines 5)  Promethazine Hcl 25 Mg Tabs (Promethazine hcl) .[name].. 1 by mouth q 6h as needed 6)  Triamcinolone Acetonide 0.1 % Crea (Triamcinolone acetonide) .... Apply 2-3x prn 7)  Truetest Test Strp (Glucose blood) .... Use as directed two times a day 8)  Lisinopril 2.5 Mg Tabs (Lisinopril) .[name].. 1 by mouth once daily 9)  Ultracet 37.5-325 Mg Tabs (Tramadol-acetaminophen) .[name].. 1 by mouth every  6 houyrs as needed for headache pain 10)  Aspirin 81 Mg Tabs (Aspirin) .... Take 1 by mouth once daily 11)  Iron 325 (65 Fe) Mg Tabs (Ferrous sulfate) .... Take 1 by mouth once daily 12)  Celexa 20 Mg Tabs (Citalopram hydrobromide) .[name].. 1 by mouth once daily 13)  Alprazolam 0.5 Mg Tabs (Alprazolam) .[name].. 1 by mouth three times a day as needed for anxiety 14)  Nasonex 50 Mcg/act Susp (Mometasone furoate) .[name].. 1 spray each nostril every morning  Patient Instructions: 1)  it was good to see you today. 2)  continue metformin same dose for now 3)  test(s) ordered today - your results will be posted on the phone tree for review in 48-72 hours from the time of test completion; call [phone] and enter your 9 digit MRN (listed above on this page, just below your name); if any changes need to be made or there are abnormal results, you will be contacted directly.  4)  reduce dose of lisinopril to 2.5 mg once daily due to low blood pressure -  5)  stop clonazepam and continue alpraz as needed 6)  your prescriptions have been electronically submitted or faxed to your pharmacy. Please take as directed. Contact our office if you believe you're having problems with the medication(s).  7)  Please schedule a follow-up appointment in 3 month to monitor diabetes and weight, call sooner if problems.  Prescriptions: ALPRAZOLAM 0.5 MG TABS (ALPRAZOLAM) 1 by mouth three times a day as needed for anxiety  #30 x 1   Entered and Authorized by:   Newt Lukes MD   Signed by:   Newt Lukes MD on 09/09/2010   Method used:   Printed then faxed to ...       [facility] [address] (retail)       [address]       [place], [place]  [address]       Ph: [phone] or [phone]       Fax: [phone]   RxID:   [id number] LISINOPRIL 2.5 MG TABS (LISINOPRIL) 1  by mouth once daily  #90 x 1   Entered and Authorized by:   Newt Lukes MD   Signed by:   Newt Lukes  MD on 09/09/2010   Method used:   Electronically to        [facility] [address] (retail)       [address]       [place], [place]  [address]       Ph: [phone] or [phone]       Fax: [phone]   RxID:   [id number]    Orders Added: 1)  TLB-A1C / Hgb A1C (Glycohemoglobin) [83036-A1C] 2)  TLB-Creatinine, Blood [82565-CREA] 3)  TLB-CBC Platelet - w/Differential [85025-CBCD] 4)  TLB-TSH (Thyroid Stimulating Hormone) [84443-TSH] 5)  Est. Patient Level IV [27253] 6)  Prescription Created Electronically [id number]

## 2010-09-27 ENCOUNTER — Emergency Department (HOSPITAL_COMMUNITY): Payer: BC Managed Care – PPO

## 2010-09-27 ENCOUNTER — Emergency Department (HOSPITAL_COMMUNITY)
Admission: EM | Admit: 2010-09-27 | Discharge: 2010-09-28 | Disposition: A | Discharge status: Home / Self Care | Payer: BC Managed Care – PPO | Attending: Emergency Medicine | Admitting: Emergency Medicine

## 2010-09-27 ENCOUNTER — Encounter: Payer: Self-pay | Admitting: Internal Medicine

## 2010-09-27 ENCOUNTER — Inpatient Hospital Stay (INDEPENDENT_AMBULATORY_CARE_PROVIDER_SITE_OTHER)
Admission: RE | Admit: 2010-09-27 | Discharge: 2010-09-27 | Disposition: A | Discharge status: Home / Self Care | Payer: BC Managed Care – PPO | Source: Ambulatory Visit | Attending: Family Medicine | Admitting: Family Medicine

## 2010-09-27 DIAGNOSIS — R42 Dizziness and giddiness: Secondary | ICD-10-CM

## 2010-09-27 DIAGNOSIS — H53489 Generalized contraction of visual field, unspecified eye: Secondary | ICD-10-CM | POA: Insufficient documentation

## 2010-09-27 DIAGNOSIS — G43909 Migraine, unspecified, not intractable, without status migrainosus: Secondary | ICD-10-CM | POA: Insufficient documentation

## 2010-09-27 DIAGNOSIS — R51 Headache: Secondary | ICD-10-CM

## 2010-09-27 DIAGNOSIS — E119 Type 2 diabetes mellitus without complications: Secondary | ICD-10-CM | POA: Insufficient documentation

## 2010-09-27 DIAGNOSIS — R55 Syncope and collapse: Secondary | ICD-10-CM | POA: Insufficient documentation

## 2010-09-27 DIAGNOSIS — R61 Generalized hyperhidrosis: Secondary | ICD-10-CM | POA: Insufficient documentation

## 2010-09-27 DIAGNOSIS — R5381 Other malaise: Secondary | ICD-10-CM | POA: Insufficient documentation

## 2010-09-27 DIAGNOSIS — R5383 Other fatigue: Secondary | ICD-10-CM | POA: Insufficient documentation

## 2010-09-27 DIAGNOSIS — R110 Nausea: Secondary | ICD-10-CM | POA: Insufficient documentation

## 2010-09-27 LAB — CONVERTED CEMR LAB
AST: 19 units/L
Albumin: 3.4 g/dL
Alkaline Phosphatase: 114 units/L
Basophils Relative: 0 %
CO2: 24 meq/L
Creatinine, Ser: 0.67 mg/dL
Eosinophils Relative: 1 %
Glucose, Bld: 100 mg/dL
Hemoglobin: 13.2 g/dL
Neutrophils Relative %: 76 %
RBC: 4.8 M/uL
Total Bilirubin: 0.3 mg/dL
WBC: 10.8 10*3/uL

## 2010-09-27 LAB — URINE MICROSCOPIC-ADD ON

## 2010-09-27 LAB — POCT PREGNANCY, URINE
Preg Test, Ur: NEGATIVE
Preg Test, Ur: NEGATIVE

## 2010-09-27 LAB — DIFFERENTIAL
Basophils Absolute: 0 10*3/uL (ref 0.0–0.1)
Basophils Relative: 0 % (ref 0–1)
Lymphocytes Relative: 19 % (ref 12–46)
Monocytes Relative: 4 % (ref 3–12)
Neutro Abs: 8.2 10*3/uL — ABNORMAL HIGH (ref 1.7–7.7)
Neutrophils Relative %: 76 % (ref 43–77)

## 2010-09-27 LAB — POCT URINALYSIS DIPSTICK
Bilirubin Urine: NEGATIVE
Ketones, ur: NEGATIVE mg/dL
Protein, ur: 30 mg/dL — AB
Specific Gravity, Urine: 1.025 (ref 1.005–1.030)
pH: 7 (ref 5.0–8.0)

## 2010-09-27 LAB — URINALYSIS, ROUTINE W REFLEX MICROSCOPIC
Bilirubin Urine: NEGATIVE
Ketones, ur: NEGATIVE mg/dL
Protein, ur: NEGATIVE mg/dL
Specific Gravity, Urine: 1.008 (ref 1.005–1.030)
Urobilinogen, UA: 0.2 mg/dL (ref 0.0–1.0)

## 2010-09-27 LAB — CBC
HCT: 39.9 % (ref 36.0–46.0)
Hemoglobin: 13.2 g/dL (ref 12.0–15.0)
RBC: 4.8 MIL/uL (ref 3.87–5.11)

## 2010-09-27 LAB — COMPREHENSIVE METABOLIC PANEL
ALT: 16 U/L (ref 0–35)
AST: 19 U/L (ref 0–37)
Albumin: 3.4 g/dL — ABNORMAL LOW (ref 3.5–5.2)
Alkaline Phosphatase: 114 U/L (ref 39–117)
CO2: 24 mEq/L (ref 19–32)
Chloride: 107 mEq/L (ref 96–112)
GFR calc Af Amer: >60 mL/min (ref >60)
GFR calc non Af Amer: >60 mL/min (ref >60)
Potassium: 3.8 mEq/L (ref 3.5–5.1)
Sodium: 139 mEq/L (ref 135–145)
Total Bilirubin: 0.3 mg/dL (ref 0.3–1.2)

## 2010-09-27 LAB — GLUCOSE, CAPILLARY: Glucose-Capillary: 98 mg/dL (ref 70–99)

## 2010-10-02 ENCOUNTER — Encounter: Payer: Self-pay | Admitting: Internal Medicine

## 2010-10-02 ENCOUNTER — Ambulatory Visit (INDEPENDENT_AMBULATORY_CARE_PROVIDER_SITE_OTHER): Payer: BC Managed Care – PPO | Admitting: Internal Medicine

## 2010-10-02 ENCOUNTER — Other Ambulatory Visit: Payer: Self-pay | Admitting: Internal Medicine

## 2010-10-02 DIAGNOSIS — G43009 Migraine without aura, not intractable, without status migrainosus: Secondary | ICD-10-CM

## 2010-10-08 NOTE — Assessment & Plan Note (Signed)
Summary: post ER [facility]   Vital Signs:  Patient profile:   30 year old female Height:      67 inches (170.18 cm) Weight:      174.2 pounds (79.18 kg) O2 Sat:      97 % on Room air Temp:     98.6 degrees F (37.00 degrees C) oral Pulse rate:   88 / minute BP sitting:   98 / 68  (left arm) Cuff size:   large  Vitals Entered By: Orlan Leavens RMA (October 02, 2010 8:08 AM)  O2 Flow:  Room air CC: Er f/u, Headache Is Patient Diabetic? Yes Did you bring your meter with you today? Yes Pain Assessment Patient in pain? no        Primary Care Provider:  Newt Lukes MD  CC:  Er f/u and Headache.  History of Present Illness: here for ER f/u - seen 09/27/10 for HA and near syncope ?transient L side weakness - head ct normal, all labs normal pain resolved after fentanyl and dilaudid x 2 each see migraines below  reviewed chronic med issues:  anxiety symptoms - hx same -usually controls symptoms with counseling and biofeedback -  related to increase stress - dad dx with cancer, separation from spouse and moved in with sister, work denies si but +palp and insomnia - freq uncontrolled tearfulness - no appetite and assoc weight loss prev intol of zoloft - weight gain;  ok on celexa, good with short term xanax and klonopin  DM2 -  dx 05/2009 - a1c 7.3 then 6.9 after initiating metformin - +FHx - checks cbg at home two times a day: 120-170 range - has met with nutrtion counseling but not making much change - takes meds 100% and no adv SE on metformin  - off actos since mid 04/2010 due to hypoglycemia, mild; reduced metformin 05/2010 due to good control  hx obesity - exercise and diet efforts ongoing - >50# weight loss since initiating same 05/2009-   migraines - occurs in cycles every 3-4 mo, will have several in row then none til next cycle - prev controlled with zomig but changed to maxalt by insurance - no relief with imitrex, doing well unitl ER visit 09/27/10 - prev used  "cocktail" of darvocet + phenergan when having bad spell - also good relief with tordol - now tramadol/ultracet - no n/v - new prodromal symptoms 09/2010 - no current symptoms - worse with stress and poor sleep    Clinical Review Panels:  Lipid Management   Cholesterol:  184 (11/15/2009)   LDL (bad choesterol):  74 (11/15/2009)   HDL (good cholesterol):  78.00 (11/15/2009)  CBC   WBC:  10.8 (09/27/2010)   RBC:  4.80 (09/27/2010)   Hgb:  13.2 (09/27/2010)   Hct:  39.9 (09/27/2010)   Platelets:  373 (09/27/2010)   MCV  83.1 (09/27/2010)   MCHC  33.4 (09/09/2010)   RDW  14.8 (09/27/2010)   PMN:  76 (09/27/2010)   Lymphs:  22.5 (09/09/2010)   Monos:  4 (09/27/2010)   Eosinophils:  1 (09/27/2010)   Basophil:  0 (09/27/2010)  Complete Metabolic Panel   Glucose:  100 (09/27/2010)   Sodium:  139 (09/27/2010)   Potassium:  3.8 (09/27/2010)   Chloride:  107 (09/27/2010)   CO2:  24 (09/27/2010)   BUN:  6 (09/27/2010)   Creatinine:  0.67 (09/27/2010)   Albumin:  3.4 (09/27/2010)   Total Protein:  6.9 (09/27/2010)   Calcium:  9.2 (09/27/2010)   Total Bili:  0.3 (09/27/2010)   Alk Phos:  114 (09/27/2010)   SGPT (ALT):  16 (09/27/2010)   SGOT (AST):  19 (09/27/2010)   -  Date:  09/27/2010    WBC: 10.8    HGB: 13.2    HCT: 39.9    RBC: 4.80    PLT: 373    MCV: 83.1    RDW: 14.8    Neutrophil: 76    Lymphs: 19    Monos: 4    Eos: 1    Basophil: 0    BG Random: 100    BUN: 6    Creatinine: 0.67    Sodium: 139    Potassium: 3.8    Chloride: 107    CO2 Total: 24    SGOT (AST): 19    SGPT (ALT): 16    T. Bilirubin: 0.3    Alk Phos: 114    Calcium: 9.2    Total Protein: 6.9    Albumin: 3.4  Current Medications (verified): 1)  Tri-Previfem 0.18/0.215/0.25 Mg-35 Mcg Tabs (Norgestim-Eth Estrad Triphasic) 2)  Metformin Hcl 1000 Mg Tabs (Metformin Hcl) .... 1/2 Tab By Mouth Two Times A Day 3)  Vitamin D3 1000 Unit Tabs (Cholecalciferol) 4)  Relpax 40 Mg Tabs  (Eletriptan Hydrobromide) .[name].. 1 By Mouth As Needed For Migraines 5)  Promethazine Hcl 25 Mg Tabs (Promethazine Hcl) .[name].. 1 By Mouth Q 6h As Needed 6)  Triamcinolone Acetonide 0.1 % Crea (Triamcinolone Acetonide) .... Apply 2-3x Prn 7)  Truetest Test  Strp (Glucose Blood) .... Use As Directed Two Times A Day 8)  Lisinopril 2.5 Mg Tabs (Lisinopril) .[name].. 1 By Mouth Once Daily 9)  Ultracet 37.5-325 Mg Tabs (Tramadol-Acetaminophen) .[name].. 1 By Mouth Every 6 Houyrs As Needed For Headache Pain 10)  Aspirin 81 Mg Tabs (Aspirin) .... Take 1 By Mouth Once Daily 11)  Iron 325 (65 Fe) Mg Tabs (Ferrous Sulfate) .... Take 1 By Mouth Once Daily 12)  Celexa 20 Mg Tabs (Citalopram Hydrobromide) .[name].. 1 By Mouth Once Daily 13)  Alprazolam 0.5 Mg Tabs (Alprazolam) .[name].. 1 By Mouth Three Times A Day As Needed For Anxiety 14)  Nasonex 50 Mcg/act Susp (Mometasone Furoate) .[name].. 1 Spray Each Nostril Every Morning  Allergies (verified): No Known Drug Allergies  Past History:  Past Medical History: Diabetes mellitus, type II obesity, hx depression/anxiety anemia, mild dyslipidemia migraine headaches  GERD   allergic rhinitis  MD roster:   gyn- [facility]  counseling - diane loyd ([facility])  Review of Systems  The patient denies fever, vision loss, chest pain, syncope, abdominal pain, severe indigestion/heartburn, muscle weakness, suspicious skin lesions, and difficulty walking.    Physical Exam  General:  overweight-appearing.  alert, well-developed, well-nourished, and cooperative to examination.  Lungs:  normal respiratory effort, no intercostal retractions or use of accessory muscles; normal breath sounds bilaterally - no crackles and no wheezes.    Heart:  normal rate, regular rhythm, no murmur, and no rub. BLE without edema.  Neurologic:  alert & oriented X3 and cranial nerves II-XII symetrically intact.  strength normal in all extremities, sensation intact to light touch, and gait  normal. speech fluent without dysarthria or aphasia; follows commands with good comprehension.    Impression & Recommendations:  Problem # 1:  MIGRAINE, COMMON (ICD-346.10)  new headache pattern with ER visit for same 2/17 - records/labs and head ct reviewed ?need to change back to zomig from maxalt vs consider prophlaxis -  discussed poss topamax 1st check MRI/MRA due to change in nature of migrain with prodrome and transient neuro deficits also stop ACEI to avoid underperfusion complicationg neuro symptoms -  Her updated medication list for this problem includes:    Relpax 40 Mg Tabs (Eletriptan hydrobromide) .[name]... 1 by mouth as needed for migraines    Ultracet 37.5-325 Mg Tabs (Tramadol-acetaminophen) .[name]... 1 by mouth every 6 houyrs as needed for headache pain    Aspirin 81 Mg Tabs (Aspirin) .[name]... Take 1 by mouth once daily  Orders: Radiology Referral (Radiology)  Complete Medication List: 1)  Tri-previfem 0.18/0.215/0.25 Mg-35 Mcg Tabs (Norgestim-eth estrad triphasic) 2)  Metformin Hcl 1000 Mg Tabs (Metformin hcl) .... 1/2 tab by mouth two times a day 3)  Vitamin D3 1000 Unit Tabs (Cholecalciferol) 4)  Relpax 40 Mg Tabs (Eletriptan hydrobromide) .[name].. 1 by mouth as needed for migraines 5)  Promethazine Hcl 25 Mg Tabs (Promethazine hcl) .[name].. 1 by mouth q 6h as needed 6)  Triamcinolone Acetonide 0.1 % Crea (Triamcinolone acetonide) .... Apply 2-3x prn 7)  Truetest Test Strp (Glucose blood) .... Use as directed two times a day 8)  Ultracet 37.5-325 Mg Tabs (Tramadol-acetaminophen) .[name].. 1 by mouth every 6 houyrs as needed for headache pain 9)  Aspirin 81 Mg Tabs (Aspirin) .... Take 1 by mouth once daily 10)  Iron 325 (65 Fe) Mg Tabs (Ferrous sulfate) .... Take 1 by mouth once daily 11)  Celexa 20 Mg Tabs (Citalopram hydrobromide) .[name].. 1 by mouth once daily 12)  Alprazolam 0.5 Mg Tabs (Alprazolam) .[name].. 1 by mouth three times a day as needed for anxiety 13)  Nasonex 50 Mcg/act Susp  (Mometasone furoate) .[name].. 1 spray each nostril every morning  Patient Instructions: 1)  it was good to see you today.  2)  ER records and results reviewed 3)  stop lisinopril 4)  we'll make referral for MRI/MRA brain. Our office will contact you regarding this appointment once made, then notify you with results when available 5)  consider change back to Zomig or other prophlaxis as dicussed based on these results 6)  Please keep scheduled follow-up appointment to review migraines, diabetes and weight, call sooner if problems.    Orders Added: 1)  Est. Patient Level IV [11914] 2)  Radiology Referral [Radiology]      CT Scan  Procedure date:  09/27/2009  Findings:      Exam Type: CT Head w/o contrast Results: Normal findings

## 2010-10-10 ENCOUNTER — Ambulatory Visit (HOSPITAL_COMMUNITY)
Admission: RE | Admit: 2010-10-10 | Discharge: 2010-10-10 | Disposition: A | Discharge status: Home / Self Care | Payer: BC Managed Care – PPO | Source: Ambulatory Visit | Attending: Internal Medicine | Admitting: Internal Medicine

## 2010-10-10 DIAGNOSIS — G43909 Migraine, unspecified, not intractable, without status migrainosus: Secondary | ICD-10-CM | POA: Insufficient documentation

## 2010-10-10 DIAGNOSIS — G43009 Migraine without aura, not intractable, without status migrainosus: Secondary | ICD-10-CM

## 2010-10-10 DIAGNOSIS — R29898 Other symptoms and signs involving the musculoskeletal system: Secondary | ICD-10-CM | POA: Insufficient documentation

## 2010-10-10 MED ORDER — GADOBENATE DIMEGLUMINE 529 MG/ML IV SOLN
20.0000 mL | Freq: Once | INTRAVENOUS | Status: AC | PRN
  Administered 2010-10-10: 20 mL via INTRAVENOUS

## 2010-10-11 ENCOUNTER — Telehealth: Payer: Self-pay | Admitting: Internal Medicine

## 2010-10-17 NOTE — Progress Notes (Signed)
Summary: MRI/MRA brain report - no prob  Phone Note Outgoing Call   Summary of Call: please call pt - MRI/MRA brain report reviewed - no causes for weakness or headaches on scan - no  aneurysm or other problems - cont tx for complex migraines and call if symptoms worse or change - thanks Initial call taken by: Newt Lukes MD,  October 11, 2010 8:21 AM     Notified pt with results...10/11/10@11 :50am/LMB

## 2010-11-14 ENCOUNTER — Telehealth: Payer: Self-pay

## 2010-11-14 MED ORDER — TOPIRAMATE 25 MG PO TABS: 25.0000 mg | ORAL_TABLET | Freq: Every day | ORAL | Status: DC

## 2010-11-14 NOTE — Telephone Encounter (Signed)
Ok to start low dose topamax - erx done - f/u 4-6wk to review HA symptoms

## 2010-11-14 NOTE — Telephone Encounter (Signed)
Pt advised and sates she has previously scheduled appt x 4 weeks

## 2010-11-14 NOTE — Telephone Encounter (Signed)
Pt called requesting Rx for Topamax as discussed at last OV with VAL (10/02/2010). Pt states she is starting to have 2-3 migraines/week.

## 2010-11-25 ENCOUNTER — Other Ambulatory Visit: Payer: Self-pay | Admitting: *Deleted

## 2010-11-25 MED ORDER — ELETRIPTAN HYDROBROMIDE 40 MG PO TABS: 40.0000 mg | ORAL_TABLET | ORAL | Status: DC | PRN

## 2010-12-02 ENCOUNTER — Encounter: Payer: Self-pay | Admitting: Internal Medicine

## 2010-12-09 ENCOUNTER — Encounter: Payer: Self-pay | Admitting: Internal Medicine

## 2010-12-09 ENCOUNTER — Ambulatory Visit (INDEPENDENT_AMBULATORY_CARE_PROVIDER_SITE_OTHER): Payer: BC Managed Care – PPO | Admitting: Internal Medicine

## 2010-12-09 ENCOUNTER — Other Ambulatory Visit (INDEPENDENT_AMBULATORY_CARE_PROVIDER_SITE_OTHER): Payer: BC Managed Care – PPO

## 2010-12-09 DIAGNOSIS — E119 Type 2 diabetes mellitus without complications: Secondary | ICD-10-CM

## 2010-12-09 DIAGNOSIS — G43909 Migraine, unspecified, not intractable, without status migrainosus: Secondary | ICD-10-CM

## 2010-12-09 DIAGNOSIS — G43009 Migraine without aura, not intractable, without status migrainosus: Secondary | ICD-10-CM

## 2010-12-09 DIAGNOSIS — F411 Generalized anxiety disorder: Secondary | ICD-10-CM

## 2010-12-09 LAB — HEMOGLOBIN A1C: Hgb A1c MFr Bld: 6.2 % (ref 4.6–6.5)

## 2010-12-09 MED ORDER — TOPIRAMATE 25 MG PO TABS: 25.0000 mg | ORAL_TABLET | Freq: Two times a day (BID) | ORAL | Status: DC

## 2010-12-09 MED ORDER — CITALOPRAM HYDROBROMIDE 20 MG PO TABS: 20.0000 mg | ORAL_TABLET | Freq: Every day | ORAL | Status: DC

## 2010-12-09 MED ORDER — METFORMIN HCL 1000 MG PO TABS: 1000.0000 mg | ORAL_TABLET | Freq: Every day | ORAL | Status: DC

## 2010-12-09 MED ORDER — TRUEPLUS LANCETS 33G MISC: 33.0000 g | Freq: Two times a day (BID) | Status: DC

## 2010-12-09 MED ORDER — GLUCOSE BLOOD VI STRP: ORAL_STRIP | Status: DC

## 2010-12-09 MED ORDER — ELETRIPTAN HYDROBROMIDE 40 MG PO TABS: 40.0000 mg | ORAL_TABLET | ORAL | Status: DC | PRN

## 2010-12-09 NOTE — Assessment & Plan Note (Signed)
MRI brain 09/2010 unremarkable- started on topamax for prevention - improved but not yet at goal Titrate up dose now - if not improved, consider low dose TCA or Bbloc (if BP will allow) - erx topamax done

## 2010-12-09 NOTE — Progress Notes (Signed)
  Subjective:    Patient ID: Lisa Duran, female    DOB: May 02, 1981, 30 y.o.   MRN: [id number]  HPI  Here for follow up - reviewed chronic med issues:  anxiety symptoms - hx same - historically controlled symptoms with counseling and biofeedback -  related to increase stress - dad dx with cancer, separation from spouse and moved in with sister, work demands prev intol of zoloft - weight gain;  improved on celexa, good with short term xanax and klonopin  DM2 -  dx 05/2009 - a1c 7.3 then 6.9 after initiating metformin - +FHx - checks cbg at home two times a day: 120-170 range - has met with nutrtion counseling but not making much change - takes meds 100% and no adv SE on metformin  - off actos since mid 04/2010 due to hypoglycemia, mild; reduced metformin 05/2010 due to good control  hx obesity - exercise and diet efforts ongoing - weight loss since initiating same 05/2009, slow "backslide" reviewed-   Common migraines - occurs in cycles -previously every 3-4 mo, now several in row - prev controlled with zomig but changed to maxalt by insurance - no relief with imitrex, relief with tordol - now tramadol/ultracet as needed -ER visit 09/2010 due to new prodromal symptoms 09/2010 - symptoms worse with stress and poor sleep - MRI brain 09/2010 unremarkable - started topamax low dose without resolution but quicker response to med tx - no adv SE  Past Medical History  Diagnosis Date  . Obesity, unspecified   . MIGRAINE, COMMON   . OVARIAN CYST   . HAIR LOSS   . ALLERGIC RHINITIS   . Anemia   . ANXIETY   . DIABETES MELLITUS, TYPE II     Review of Systems  Constitutional: Negative for fatigue.  Respiratory: Negative for cough.   Cardiovascular: Negative for chest pain.  Genitourinary: Negative for dysuria.  Neurological: Negative for syncope and headaches.       Objective:   Physical Exam  Constitutional: She is oriented to person, place, and time. She appears well-developed and  well-nourished. No distress.  Cardiovascular: Normal rate, regular rhythm and normal heart sounds.   No murmur heard. Pulmonary/Chest: Effort normal and breath sounds normal. No respiratory distress.  Neurological: She is alert and oriented to person, place, and time. No cranial nerve deficit.  Psychiatric: She has a normal mood and affect. Her behavior is normal. Thought content normal.  BP 98/68  Pulse 116  Temp(Src) 98.7 F (37.1 C) (Oral)  Ht 5\' 7"  (1.702 m)  Wt 181 lb 12.8 oz (82.464 kg)  BMI 28.47 kg/m2  SpO2 98% Lab Results  Component Value Date   WBC 10.8* 09/27/2010   HGB 13.2 09/27/2010   HCT 39.9 09/27/2010   PLT 373 09/27/2010   CHOL 184 11/15/2009   TRIG 158.0* 11/15/2009   HDL 78.00 11/15/2009   ALT 16 09/27/2010   AST 19 09/27/2010   NA 139 09/27/2010   K 3.8 09/27/2010   CL 107 09/27/2010   CREATININE 0.67 09/27/2010   BUN 6 09/27/2010   CO2 24 09/27/2010   TSH 1.77 09/09/2010   HGBA1C 6.1 09/09/2010   MICROALBUR 2.5* 11/20/2009       Assessment & Plan:  See problem list. Medications and labs reviewed today.

## 2010-12-09 NOTE — Assessment & Plan Note (Signed)
tol celexa well - prior sertraline with adv SE (wt gain) - uses BZ prn The current medical regimen is effective;  continue present plan and medications.

## 2010-12-09 NOTE — Assessment & Plan Note (Signed)
The current medical regimen is effective;  continue present plan and medications.  Reviewed weight gain and adverse effects on metabolic syndrome -  Lab Results  Component Value Date   HGBA1C 6.1 09/09/2010

## 2010-12-09 NOTE — Patient Instructions (Signed)
It was good to see you today. Medications reviewed, will make the following changes at this time: increase topamax to 2x/day Your prescription(s) and refills have been submitted to your pharmacy and mail order. Please take as directed and contact our office if you believe you are having problem(s) with the medication(s). Test(s) ordered today. Your results will be called to you after review (48-72hours after test completion). If any changes need to be made, you will be notified at that time. Please schedule followup in 3-4 months for diabetes check, call sooner if problems. Continue to work on lifestyle changes as discussed (low fat, low carb, increased protein diet; improved exercise efforts; weight loss) to control sugar, blood pressure and cholesterol levels and/or reduce risk of developing other medical problems. Look into [email or web address] or other type of food journal to assist you in this process.

## 2010-12-10 ENCOUNTER — Other Ambulatory Visit: Payer: Self-pay | Admitting: Internal Medicine

## 2010-12-10 MED ORDER — TOPIRAMATE 25 MG PO TABS: 25.0000 mg | ORAL_TABLET | Freq: Two times a day (BID) | ORAL | Status: DC

## 2010-12-10 MED ORDER — TOPIRAMATE 25 MG PO TABS: 25.0000 mg | ORAL_TABLET | Freq: Every day | ORAL | Status: DC

## 2010-12-10 NOTE — Telephone Encounter (Signed)
Please call pt - slight increase in a1c to 6.2 (previously 6.1) - no medication changes recommended but pt to work on diet and exercise as discussed at OV - thanks

## 2010-12-10 NOTE — Telephone Encounter (Signed)
Pt Notified with lab results results. Also pt states md told her to take Topamax  twice a day. Pharmacy did received refill but it was for once a day. Is it ok to change to bid & send new rx?[name] KitchenMarland KitchenMarland KitchenMarland Kitchen5/1/12@1 :21pm/LMB

## 2010-12-10 NOTE — Telephone Encounter (Signed)
Yes - topamax rx changed to 25mg  bid - new erx done - thanks

## 2010-12-10 NOTE — Telephone Encounter (Signed)
Pt notified rx sent to pharmacy...12/10/10@2 ;22pm/LMB

## 2010-12-10 NOTE — Telephone Encounter (Signed)
Addended by: Rene Paci ANN on: 12/10/2010 02:04 PM   Modules accepted: Orders

## 2010-12-18 ENCOUNTER — Telehealth: Payer: Self-pay | Admitting: *Deleted

## 2010-12-18 DIAGNOSIS — F411 Generalized anxiety disorder: Secondary | ICD-10-CM

## 2010-12-18 NOTE — Telephone Encounter (Signed)
Ok - done - thanks

## 2010-12-18 NOTE — Telephone Encounter (Signed)
Pt is requesting referral to Judithe Modest, MSW, LCSW with [facility] for evaluation/testing for ADHD

## 2010-12-19 ENCOUNTER — Other Ambulatory Visit: Payer: Self-pay | Admitting: *Deleted

## 2010-12-19 MED ORDER — CITALOPRAM HYDROBROMIDE 20 MG PO TABS: 20.0000 mg | ORAL_TABLET | Freq: Every day | ORAL | Status: DC

## 2010-12-19 NOTE — Telephone Encounter (Signed)
Pt advised of referral and will expect a call from [facility] or Judithe Modest directly with appt info

## 2011-01-09 ENCOUNTER — Other Ambulatory Visit: Payer: Self-pay | Admitting: *Deleted

## 2011-01-09 MED ORDER — TRAMADOL-ACETAMINOPHEN 37.5-325 MG PO TABS: 1.0000 | ORAL_TABLET | Freq: Four times a day (QID) | ORAL | Status: DC | PRN

## 2011-01-09 NOTE — Telephone Encounter (Signed)
Yes - ok to add to med list and send refill: #40 disp, 2 refills. thanks

## 2011-01-09 NOTE — Telephone Encounter (Signed)
Med is not on medication list. Is this ok to refill?

## 2011-01-13 ENCOUNTER — Ambulatory Visit (INDEPENDENT_AMBULATORY_CARE_PROVIDER_SITE_OTHER): Payer: BC Managed Care – PPO | Admitting: Licensed Clinical Social Worker

## 2011-01-13 DIAGNOSIS — F909 Attention-deficit hyperactivity disorder, unspecified type: Secondary | ICD-10-CM

## 2011-01-17 ENCOUNTER — Ambulatory Visit (INDEPENDENT_AMBULATORY_CARE_PROVIDER_SITE_OTHER): Payer: BC Managed Care – PPO | Admitting: Licensed Clinical Social Worker

## 2011-01-17 ENCOUNTER — Other Ambulatory Visit: Payer: Self-pay | Admitting: *Deleted

## 2011-01-17 DIAGNOSIS — F909 Attention-deficit hyperactivity disorder, unspecified type: Secondary | ICD-10-CM

## 2011-01-17 MED ORDER — ALPRAZOLAM 0.5 MG PO TABS: 0.5000 mg | ORAL_TABLET | Freq: Three times a day (TID) | ORAL | Status: DC | PRN

## 2011-01-17 NOTE — Telephone Encounter (Signed)
Faxed back paper request back to [facility] drug ok # 30 with 1 additional refills...01/17/11@4 :45pm/LMB

## 2011-01-27 ENCOUNTER — Encounter: Payer: Self-pay | Admitting: Internal Medicine

## 2011-01-27 ENCOUNTER — Ambulatory Visit (INDEPENDENT_AMBULATORY_CARE_PROVIDER_SITE_OTHER): Payer: BC Managed Care – PPO | Admitting: Internal Medicine

## 2011-01-27 ENCOUNTER — Other Ambulatory Visit (INDEPENDENT_AMBULATORY_CARE_PROVIDER_SITE_OTHER): Payer: BC Managed Care – PPO

## 2011-01-27 DIAGNOSIS — G43009 Migraine without aura, not intractable, without status migrainosus: Secondary | ICD-10-CM

## 2011-01-27 DIAGNOSIS — E119 Type 2 diabetes mellitus without complications: Secondary | ICD-10-CM

## 2011-01-27 DIAGNOSIS — F909 Attention-deficit hyperactivity disorder, unspecified type: Secondary | ICD-10-CM

## 2011-01-27 DIAGNOSIS — G43909 Migraine, unspecified, not intractable, without status migrainosus: Secondary | ICD-10-CM

## 2011-01-27 MED ORDER — TRIAMCINOLONE ACETONIDE 0.1 % EX CREA: TOPICAL_CREAM | Freq: Two times a day (BID) | CUTANEOUS | Status: DC | PRN

## 2011-01-27 MED ORDER — AMPHETAMINE-DEXTROAMPHETAMINE 10 MG PO TABS: 10.0000 mg | ORAL_TABLET | Freq: Every day | ORAL | Status: DC

## 2011-01-27 MED ORDER — TOPIRAMATE 25 MG PO TABS: 50.0000 mg | ORAL_TABLET | Freq: Two times a day (BID) | ORAL | Status: DC

## 2011-01-27 MED ORDER — PROMETHAZINE HCL 25 MG PO TABS: 25.0000 mg | ORAL_TABLET | Freq: Four times a day (QID) | ORAL | Status: DC | PRN

## 2011-01-27 NOTE — Progress Notes (Signed)
Subjective:    Patient ID: Lisa Duran, female    DOB: 1981-05-29, 30 y.o.   MRN: [id number]  HPI   Here for follow up - reviewed chronic med issues:  anxiety symptoms - The patient has a history of same - historically controlled symptoms with counseling and biofeedback -  related to increase stress - dad dx with cancer, separation/divorce from spouse 2010, work demands prev intol of zoloft - weight gain improved on celexa, good with short term xanax and klonopin  New dx ADHD 01/2011 - confirmed on battery testing with behav health - +FH - would like to start med tx  DM2 -  dx 05/2009 - a1c 7.3 then 6.9 after initiating metformin - +FHx - checks cbg at home two times a day: 120-170 range - has met with nutrtion counseling but not making much change - takes meds 100% and no adv SE on metformin  - off actos since mid 04/2010 due to hypoglycemia, mild; reduced metformin 05/2010 due to good control  The patient has a history of obesity - exercise and diet efforts ongoing - weight loss since initiating same 05/2009, slow "backslide" reviewed-   Common migraines - occurs in cycles -previously every 3-4 mo, now several in row - prev controlled with zomig but changed to maxalt by insurance - no relief with imitrex, relief with tordol - now tramadol/ultracet as needed -ER visit 09/2010 due to new prodromal symptoms 09/2010 - symptoms worse with stress and poor sleep - MRI brain 09/2010 unremarkable - started topamax 10/2010 low dose without resolution but quicker response to med tx - no adv SE - ongoign titration of med  Past Medical History  Diagnosis Date  . Obesity, unspecified   . MIGRAINE, COMMON   . OVARIAN CYST   . ALLERGIC RHINITIS   . Anemia   . ANXIETY   . DIABETES MELLITUS, TYPE II    Family History  Problem Relation Age of Onset  . Diabetes Mother   . Hyperlipidemia Mother   . Diabetes Father   . Hyperlipidemia Father   . Coronary artery disease Father   . Hypertension Father     History  Substance Use Topics  . Smoking status: Never Smoker   . Smokeless tobacco: Not on file   Comment: single, separated from spouse 02/2010.   [name] Alcohol Use: Yes    Review of Systems  Constitutional: Negative for fatigue.  Eyes: Negative for visual disturbance.  Respiratory: Negative for cough.   Cardiovascular: Negative for chest pain.  Genitourinary: Negative for dysuria.  Musculoskeletal: Negative for back pain and arthralgias.  Neurological: Positive for headaches. Negative for dizziness and syncope.  No other specific complaints in a complete review of systems (except as listed in HPI above).      Objective:   Physical Exam  Constitutional: She is oriented to person, place, and time. She appears well-developed and well-nourished. No distress.  Cardiovascular: Normal rate, regular rhythm and normal heart sounds.   No murmur heard. Pulmonary/Chest: Effort normal and breath sounds normal. No respiratory distress.  Neurological: She is alert and oriented to person, place, and time. No cranial nerve deficit.  Psychiatric: She has a normal mood and affect. Her behavior is normal. Thought content normal.  BP 100/62  Pulse 95  Temp(Src) 98.6 F (37 C) (Oral)  Ht 5\' 4"  (1.626 m)  Wt 189 lb 3.2 oz (85.821 kg)  BMI 32.48 kg/m2  SpO2 96% Wt Readings from Last 3 Encounters:  01/27/11 189 lb  3.2 oz (85.821 kg)  12/09/10 181 lb 12.8 oz (82.464 kg)  10/02/10 174 lb 3.2 oz (79.017 kg)    Lab Results  Component Value Date   WBC 10.8* 09/27/2010   HGB 13.2 09/27/2010   HCT 39.9 09/27/2010   PLT 373 09/27/2010   CHOL 184 11/15/2009   TRIG 158.0* 11/15/2009   HDL 78.00 11/15/2009   ALT 16 09/27/2010   AST 19 09/27/2010   NA 139 09/27/2010   K 3.8 09/27/2010   CL 107 09/27/2010   CREATININE 0.67 09/27/2010   BUN 6 09/27/2010   CO2 24 09/27/2010   TSH 1.77 09/09/2010   HGBA1C 6.2 12/09/2010   MICROALBUR 2.5* 11/20/2009       Assessment & Plan:  See problem list. Medications and  labs reviewed today.

## 2011-01-27 NOTE — Patient Instructions (Addendum)
It was good to see you today. Test(s) ordered today. Your results will be called to you after review (48-72hours after test completion). If any changes need to be made, you will be notified at that time. Start adderall low dose for ADHD as discussed - 30day trial period prescribed - Your prescription(s) have been given to you to submit to your pharmacy. Please take as directed and contact our office if you believe you are having problem(s) with the medication(s). May increase topamax by titration adding 25mg /week as discussed for goal 50mg  2x/day - Test(s) ordered today. Your results will be called to you after review (48-72hours after test completion). If any changes need to be made, you will be notified at that time.  Refill on medication(s) as discussed today. Please schedule followup in 3 months, call sooner if problems.

## 2011-01-27 NOTE — Assessment & Plan Note (Signed)
behav testing reviewed - ADHD confirmed on battery testing 01/13/11 - report reviewed adderall will be prescribed on 30d trial basis - advised to start adderall at different time than increase in topamax dose as above to separate out different side effects if any -

## 2011-01-27 NOTE — Assessment & Plan Note (Signed)
MRI brain 09/2010 unremarkable- started on topamax for prevention 10/2010 - improved but not yet at goal Continue to titrate up dose now - if not improved, consider low dose TCA or Bbloc (if BP will allow) - new erx topamax done

## 2011-01-27 NOTE — Assessment & Plan Note (Signed)
The current medical regimen is effective;  continue present plan and medications.  Reviewed weight gain and adverse effects on metabolic syndrome -  Lab Results  Component Value Date   HGBA1C 6.2 12/09/2010

## 2011-02-27 ENCOUNTER — Other Ambulatory Visit: Payer: Self-pay | Admitting: *Deleted

## 2011-02-27 DIAGNOSIS — F909 Attention-deficit hyperactivity disorder, unspecified type: Secondary | ICD-10-CM

## 2011-02-27 NOTE — Telephone Encounter (Signed)
Glad topamax is working - increase adderall to 10mg  bid - new rx done - thanks

## 2011-02-27 NOTE — Telephone Encounter (Signed)
Pt updating new meds: Topamax - working very well; no migraines Adderall 10mg  - cannot tell any difference; what are recommendations/suggestions?

## 2011-02-28 MED ORDER — AMPHETAMINE-DEXTROAMPHETAMINE 10 MG PO TABS: 10.0000 mg | ORAL_TABLET | Freq: Two times a day (BID) | ORAL | Status: DC

## 2011-02-28 NOTE — Telephone Encounter (Signed)
Notified pt with md response. Left rx in cabinet for pick-up.[name] KitchenMarland Kitchen7/20/12@4 :27pm/LMB

## 2011-03-05 ENCOUNTER — Other Ambulatory Visit: Payer: Self-pay

## 2011-03-05 DIAGNOSIS — G43009 Migraine without aura, not intractable, without status migrainosus: Secondary | ICD-10-CM

## 2011-03-05 MED ORDER — TOPIRAMATE 25 MG PO TABS: 50.0000 mg | ORAL_TABLET | Freq: Two times a day (BID) | ORAL | Status: DC

## 2011-03-05 NOTE — Telephone Encounter (Signed)
Pt called requesting new Rx of topamax due to pharmacy change.

## 2011-03-06 ENCOUNTER — Other Ambulatory Visit: Payer: Self-pay

## 2011-03-06 DIAGNOSIS — G43009 Migraine without aura, not intractable, without status migrainosus: Secondary | ICD-10-CM

## 2011-03-06 MED ORDER — TOPIRAMATE 25 MG PO TABS: 50.0000 mg | ORAL_TABLET | Freq: Two times a day (BID) | ORAL | Status: DC

## 2011-03-06 NOTE — Telephone Encounter (Signed)
Pt called requesting a 7 day supply of Topamax to local pharmacy. Pt has a 1 week  wait  for shipment [facility].

## 2011-03-10 ENCOUNTER — Ambulatory Visit: Payer: BC Managed Care – PPO | Admitting: Internal Medicine

## 2011-04-04 ENCOUNTER — Other Ambulatory Visit: Payer: Self-pay

## 2011-04-04 DIAGNOSIS — F909 Attention-deficit hyperactivity disorder, unspecified type: Secondary | ICD-10-CM

## 2011-04-04 MED ORDER — AMPHETAMINE-DEXTROAMPHETAMINE 10 MG PO TABS: 10.0000 mg | ORAL_TABLET | Freq: Two times a day (BID) | ORAL | Status: DC

## 2011-04-04 NOTE — Telephone Encounter (Signed)
Pt informed, Rx in cabinet for pt pick up  

## 2011-04-07 ENCOUNTER — Telehealth: Payer: Self-pay

## 2011-04-07 DIAGNOSIS — F909 Attention-deficit hyperactivity disorder, unspecified type: Secondary | ICD-10-CM

## 2011-04-07 MED ORDER — AMPHETAMINE-DEXTROAMPHETAMINE 20 MG PO TABS: 20.0000 mg | ORAL_TABLET | Freq: Two times a day (BID) | ORAL | Status: DC

## 2011-04-07 NOTE — Telephone Encounter (Signed)
Ok - new rx for higher dose done - if still not improved symptoms, will need OV to discuss alternate med - thanks

## 2011-04-07 NOTE — Telephone Encounter (Signed)
Pt called stating that she has not seen any improvement in ADHD symptoms with Adderall. Pt is requesting to bring back Rx dated 08/24 for an increased dosage, please advise.

## 2011-04-08 NOTE — Telephone Encounter (Signed)
Pt informed, updated Rx cannot be pick up without first returning Rx written 08/24. Rx in cabinet

## 2011-04-11 ENCOUNTER — Telehealth: Payer: Self-pay

## 2011-04-11 DIAGNOSIS — G43009 Migraine without aura, not intractable, without status migrainosus: Secondary | ICD-10-CM

## 2011-04-11 MED ORDER — TOPIRAMATE 25 MG PO TABS: 50.0000 mg | ORAL_TABLET | Freq: Two times a day (BID) | ORAL | Status: DC

## 2011-04-11 NOTE — Telephone Encounter (Signed)
Pt called stating that there is a delay in her mail order medications and she will be out of migraine medications. Pt is requesting a 7 day supply to local pharmacy.

## 2011-05-07 ENCOUNTER — Ambulatory Visit (INDEPENDENT_AMBULATORY_CARE_PROVIDER_SITE_OTHER): Payer: BC Managed Care – PPO | Admitting: Internal Medicine

## 2011-05-07 ENCOUNTER — Other Ambulatory Visit (INDEPENDENT_AMBULATORY_CARE_PROVIDER_SITE_OTHER): Payer: BC Managed Care – PPO

## 2011-05-07 ENCOUNTER — Encounter: Payer: Self-pay | Admitting: Internal Medicine

## 2011-05-07 DIAGNOSIS — E119 Type 2 diabetes mellitus without complications: Secondary | ICD-10-CM

## 2011-05-07 DIAGNOSIS — F411 Generalized anxiety disorder: Secondary | ICD-10-CM

## 2011-05-07 DIAGNOSIS — F909 Attention-deficit hyperactivity disorder, unspecified type: Secondary | ICD-10-CM

## 2011-05-07 DIAGNOSIS — G43909 Migraine, unspecified, not intractable, without status migrainosus: Secondary | ICD-10-CM

## 2011-05-07 DIAGNOSIS — G43009 Migraine without aura, not intractable, without status migrainosus: Secondary | ICD-10-CM

## 2011-05-07 MED ORDER — METFORMIN HCL ER (MOD) 1000 MG PO TB24: 1000.0000 mg | ORAL_TABLET | Freq: Every day | ORAL | Status: DC

## 2011-05-07 MED ORDER — AMPHETAMINE-DEXTROAMPHETAMINE 20 MG PO TABS: 20.0000 mg | ORAL_TABLET | Freq: Two times a day (BID) | ORAL | Status: DC

## 2011-05-07 MED ORDER — ALPRAZOLAM 0.5 MG PO TABS: 0.5000 mg | ORAL_TABLET | Freq: Three times a day (TID) | ORAL | Status: DC | PRN

## 2011-05-07 MED ORDER — ELETRIPTAN HYDROBROMIDE 40 MG PO TABS: 40.0000 mg | ORAL_TABLET | ORAL | Status: DC | PRN

## 2011-05-07 MED ORDER — CITALOPRAM HYDROBROMIDE 20 MG PO TABS: 20.0000 mg | ORAL_TABLET | Freq: Every day | ORAL | Status: DC

## 2011-05-07 NOTE — Assessment & Plan Note (Signed)
The current medical regimen is effective;  continue present plan and medications.  Reviewed weight gain and adverse effects on metabolic syndrome -   Lab Results  Component Value Date   HGBA1C 6.5 01/27/2011

## 2011-05-07 NOTE — Assessment & Plan Note (Signed)
tol celexa well - prior sertraline with adv SE (wt gain) - uses BZ prn, less since on ADD tx The current medical regimen is effective;  continue present plan and medications.

## 2011-05-07 NOTE — Progress Notes (Signed)
Subjective:    Patient ID: Lisa Duran, female    DOB: 1980/10/19, 30 y.o.   MRN: [id number]  HPI   Here for follow up - reviewed chronic med issues:  anxiety - long history of same - historically controlled symptoms with counseling and biofeedback -  related to increase stress - dad dx with cancer, separation/divorce from spouse 2010, work demands prev intol of zoloft - weight gain improved on celexa, prn short term xanax or klonopin  ADHD - new dx 01/2011 - confirmed on battery testing with behav health - +FH; started med tx 01/2011 will upward dose titration symptoms are improved  DM2 -  dx 05/2009 - a1c 7.3 then 6.9 after initiating metformin; +FHx - checks cbg at home two times a day: 120-170 range - has met with nutrtion counseling but not making much change - takes meds 100% and no adv SE on metformin  - off actos since mid 04/2010 due to hypoglycemia, mild; reduced metformin 05/2010 due to good control  obesity - exercise and diet efforts ongoing - weight loss since initiating same 05/2009, slow "backslide" and reverse trend reviewed-   Common migraines - occurs in cycles -previously every 3-4 mo, now several in row - prev controlled with zomig but changed to maxalt by insurance - no relief with imitrex, relief with tordol - now tramadol/ultracet as needed -ER visit 09/2010 due to new prodromal symptoms 09/2010 - symptoms worse with stress and poor sleep - MRI brain 09/2010 unremarkable - started topamax 10/2010 low dose without resolution but quicker response to med tx - no adv SE - ongoign titration of med  Past Medical History  Diagnosis Date  . Obesity, unspecified   . MIGRAINE, COMMON   . OVARIAN CYST   . ALLERGIC RHINITIS   . Anemia   . ANXIETY   . DIABETES MELLITUS, TYPE II   . ADHD (attention deficit hyperactivity disorder)     Review of Systems Constitutional: Negative for fever.  Respiratory: Negative for cough and shortness of breath.   Cardiovascular: Negative  for chest pain.      Objective:   Physical ExamBP 102/80  Pulse 89  Temp(Src) 98.6 F (37 C) (Oral)  Ht 5\' 5"  (1.651 m)  Wt 177 lb 12.8 oz (80.65 kg)  BMI 29.59 kg/m2  SpO2 98% Wt Readings from Last 3 Encounters:  05/07/11 177 lb 12.8 oz (80.65 kg)  01/27/11 189 lb 3.2 oz (85.821 kg)  12/09/10 181 lb 12.8 oz (82.464 kg)  Constitutional: She is overweight, but well-developed and well-nourished. No distress.  Neck: Normal range of motion. Neck supple. No JVD present. No thyromegaly present.  Cardiovascular: Normal rate, regular rhythm and normal heart sounds.  No murmur heard. No BLE edema. Pulmonary/Chest: Effort normal and breath sounds normal. No respiratory distress. She has no wheezes.  Psychiatric: She has a normal mood and affect. Her behavior is normal. Judgment and thought content normal.    Lab Results  Component Value Date   WBC 10.8* 09/27/2010   HGB 13.2 09/27/2010   HCT 39.9 09/27/2010   PLT 373 09/27/2010   CHOL 184 11/15/2009   TRIG 158.0* 11/15/2009   HDL 78.00 11/15/2009   ALT 16 09/27/2010   AST 19 09/27/2010   NA 139 09/27/2010   K 3.8 09/27/2010   CL 107 09/27/2010   CREATININE 0.67 09/27/2010   BUN 6 09/27/2010   CO2 24 09/27/2010   TSH 1.77 09/09/2010   HGBA1C 6.5 01/27/2011   MICROALBUR 2.5*  11/20/2009       Assessment & Plan:  See problem list. Medications and labs reviewed today.

## 2011-05-07 NOTE — Assessment & Plan Note (Signed)
MRI brain 09/2010 unremarkable- started on topamax for prevention 10/2010 -  01/2011 titrate up dose now - improved  if relapse, consider low dose TCA or Bbloc (if BP will allow) -

## 2011-05-07 NOTE — Patient Instructions (Signed)
It was good to see you today. Medications reviewed, no changes at this time. Refill on medication(s) as discussed today. Test(s) ordered today. Your results will be called to you after review (48-72hours after test completion). If any changes need to be made, you will be notified at that time. Please schedule followup in 3-4 months for diabetes mellitus and ADD review, call sooner if problems.

## 2011-05-07 NOTE — Assessment & Plan Note (Signed)
behav testing reviewed - ADHD confirmed on battery testing 01/13/11 -  adderall started and uptitrated to current dose - doing well Continue same - 

## 2011-05-26 ENCOUNTER — Telehealth: Payer: Self-pay | Admitting: *Deleted

## 2011-05-26 DIAGNOSIS — G43009 Migraine without aura, not intractable, without status migrainosus: Secondary | ICD-10-CM

## 2011-05-26 NOTE — Telephone Encounter (Signed)
1. At last OV MD sent in RX for Glumetza (brand name) she would like RX for generic metformin. 2. Patient requesting RF of Topamax  Both to go to [facility], please call patient when complete.

## 2011-05-27 MED ORDER — METFORMIN HCL 500 MG PO TABS: 500.0000 mg | ORAL_TABLET | Freq: Two times a day (BID) | ORAL | Status: DC

## 2011-05-27 MED ORDER — TOPIRAMATE 25 MG PO TABS: 50.0000 mg | ORAL_TABLET | Freq: Two times a day (BID) | ORAL | Status: DC

## 2011-05-27 NOTE — Telephone Encounter (Signed)
MD out of office. Is this ok ?[name]...05/27/11@11 :53am/LMB

## 2011-05-27 NOTE — Telephone Encounter (Signed)
Ok for both

## 2011-06-12 ENCOUNTER — Other Ambulatory Visit: Payer: Self-pay | Admitting: Internal Medicine

## 2011-06-19 ENCOUNTER — Other Ambulatory Visit: Payer: Self-pay

## 2011-06-20 MED ORDER — AMPHETAMINE-DEXTROAMPHETAMINE 20 MG PO TABS: 20.0000 mg | ORAL_TABLET | Freq: Two times a day (BID) | ORAL | Status: DC

## 2011-06-20 NOTE — Telephone Encounter (Signed)
Pt informed, Rx in cabinet for pt pick up  

## 2011-07-07 ENCOUNTER — Telehealth: Payer: Self-pay

## 2011-07-07 MED ORDER — CITALOPRAM HYDROBROMIDE 20 MG PO TABS: 40.0000 mg | ORAL_TABLET | Freq: Every day | ORAL | Status: DC

## 2011-07-07 NOTE — Telephone Encounter (Signed)
Okay to increase citalopram to 40 mg daily. Can sent new prescription or patient can take 2-20 mg tablets daily until 20 mg supply done

## 2011-07-07 NOTE — Telephone Encounter (Signed)
Pt advised of Rx/pharmacy. Manually faxed to mail order pharmacy.

## 2011-07-07 NOTE — Telephone Encounter (Signed)
Pt called requesting to increase dosage on Celexa, please advise.

## 2011-07-14 ENCOUNTER — Other Ambulatory Visit: Payer: Self-pay | Admitting: *Deleted

## 2011-07-14 MED ORDER — CITALOPRAM HYDROBROMIDE 40 MG PO TABS: 40.0000 mg | ORAL_TABLET | Freq: Every day | ORAL | Status: DC

## 2011-07-14 NOTE — Telephone Encounter (Signed)
Notified mail service gave md response. Updated EPIC...07/14/11@2 :01pm/LMB

## 2011-07-14 NOTE — Telephone Encounter (Signed)
Left msg on 06/14/11 need to verify citalopram prescription. Received script for 20 mg take two daily. Is if ok to give pt 40 mg take 1 daily # 90. When calling back use ref# [id number]....07/14/11@11 :43am/LMB

## 2011-07-14 NOTE — Telephone Encounter (Signed)
I changed citalopram to 40 mg - take one tablet daily, prescription for 90 tablets done. Thanks

## 2011-07-29 ENCOUNTER — Other Ambulatory Visit: Payer: Self-pay

## 2011-07-30 MED ORDER — AMPHETAMINE-DEXTROAMPHETAMINE 20 MG PO TABS: 20.0000 mg | ORAL_TABLET | Freq: Two times a day (BID) | ORAL | Status: DC

## 2011-07-30 NOTE — Telephone Encounter (Signed)
Pt informed, Rx in cabinet for pt pick up  

## 2011-09-03 ENCOUNTER — Ambulatory Visit: Payer: BC Managed Care – PPO | Admitting: Internal Medicine

## 2011-09-04 ENCOUNTER — Other Ambulatory Visit: Payer: Self-pay

## 2011-09-04 MED ORDER — AMPHETAMINE-DEXTROAMPHETAMINE 20 MG PO TABS: 20.0000 mg | ORAL_TABLET | Freq: Two times a day (BID) | ORAL | Status: DC

## 2011-09-04 NOTE — Telephone Encounter (Signed)
Pt informed, Rx in cabinet for pt pick up  

## 2011-09-29 ENCOUNTER — Other Ambulatory Visit: Payer: Self-pay | Admitting: Internal Medicine

## 2011-10-14 ENCOUNTER — Encounter: Payer: Self-pay | Admitting: Internal Medicine

## 2011-10-14 ENCOUNTER — Other Ambulatory Visit (INDEPENDENT_AMBULATORY_CARE_PROVIDER_SITE_OTHER): Payer: BC Managed Care – PPO

## 2011-10-14 ENCOUNTER — Ambulatory Visit (INDEPENDENT_AMBULATORY_CARE_PROVIDER_SITE_OTHER): Payer: BC Managed Care – PPO | Admitting: Internal Medicine

## 2011-10-14 DIAGNOSIS — R5381 Other malaise: Secondary | ICD-10-CM

## 2011-10-14 DIAGNOSIS — R5383 Other fatigue: Secondary | ICD-10-CM

## 2011-10-14 DIAGNOSIS — J019 Acute sinusitis, unspecified: Secondary | ICD-10-CM

## 2011-10-14 DIAGNOSIS — E119 Type 2 diabetes mellitus without complications: Secondary | ICD-10-CM

## 2011-10-14 DIAGNOSIS — F909 Attention-deficit hyperactivity disorder, unspecified type: Secondary | ICD-10-CM

## 2011-10-14 DIAGNOSIS — E611 Iron deficiency: Secondary | ICD-10-CM

## 2011-10-14 DIAGNOSIS — F411 Generalized anxiety disorder: Secondary | ICD-10-CM

## 2011-10-14 LAB — CBC WITH DIFFERENTIAL/PLATELET
Basophils Relative: 0.6 % (ref 0.0–3.0)
Eosinophils Relative: 4.1 % (ref 0.0–5.0)
HCT: 40.2 % (ref 36.0–46.0)
Hemoglobin: 13.5 g/dL (ref 12.0–15.0)
Lymphs Abs: 1.6 10*3/uL (ref 0.7–4.0)
MCV: 85.1 fl (ref 78.0–100.0)
Monocytes Absolute: 0.4 10*3/uL (ref 0.1–1.0)
Monocytes Relative: 6.2 % (ref 3.0–12.0)
RBC: 4.73 Mil/uL (ref 3.87–5.11)
WBC: 6.9 10*3/uL (ref 4.5–10.5)

## 2011-10-14 LAB — IBC PANEL: Saturation Ratios: 9.8 % — ABNORMAL LOW (ref 20.0–50.0)

## 2011-10-14 MED ORDER — AZITHROMYCIN 250 MG PO TABS: ORAL_TABLET | ORAL | Status: AC

## 2011-10-14 MED ORDER — CITALOPRAM HYDROBROMIDE 20 MG PO TABS: 20.0000 mg | ORAL_TABLET | Freq: Every day | ORAL | Status: DC

## 2011-10-14 NOTE — Assessment & Plan Note (Signed)
The current medical regimen is effective;  continue present plan and medications.  Reviewed weight gain and adverse effects on metabolic syndrome -   Lab Results  Component Value Date   HGBA1C 6.1 05/07/2011

## 2011-10-14 NOTE — Patient Instructions (Signed)
It was good to see you today. Medications reviewed, no changes at this time. Refill on medication(s) as discussed today. Zpak for sinus infection symptoms - paper prescription printed, signed and given to you today Test(s) ordered today. Your results will be called to you after review (48-72hours after test completion). If any changes need to be made, you will be notified at that time. Please schedule followup in 3-4 months for physical and labs to include diabetes mellitus and ADD review, call sooner if problems.

## 2011-10-14 NOTE — Progress Notes (Signed)
Subjective:    Patient ID: Lisa Duran, female    DOB: 31-Jan-1981, 31 y.o.   MRN: [id number]  HPI  Here for follow up - reviewed chronic med issues:  anxiety - long history of same - historically controlled symptoms with counseling and biofeedback -  related to increase stress - dad dx with cancer, separation/divorce from spouse 2010, work demands prev intol of zoloft - weight gain, improved on celexa, prn short term xanax (preferred to klonopin) - ssri dose increased briefly 06/2011   ADHD - new dx 01/2011 - confirmed on battery testing with behav health; +FH; started med tx 01/2011 will upward dose titration -symptoms improved, controlled on current treatment  DM2 -  dx 05/2009 - a1c 7.3 then 6.9 after initiating metformin; +FHx - checks cbg at home two times a day: 120-170 range - has met with nutrtion counseling, takes meds 100% and no adv SE on metformin  - off actos since mid 04/2010 due to hypoglycemia, mild; reduced metformin 05/2010 due to good control, increased 04/2011 back to BID  obesity - exercise and diet efforts ongoing - weight loss since initiating same 05/2009, slow "backslide" and reverse trend reviewed-   Common migraines - occurs in cycles -previously every 3-4 mo, now several in row - prev controlled with zomig but changed to maxalt by insurance - no relief with imitrex, relief with tordol - now tramadol/ultracet as needed -ER visit 09/2010 due to new prodromal symptoms 09/2010 - symptoms worse with stress and poor sleep - MRI brain 09/2010 unremarkable - started topamax 10/2010 low dose without resolution but quicker response to med tx - no adv SE - ongoign titration of med  Also complains of head cold x1 week, improved but not resolved with over-the-counter medications. Progressive sinus pressure and increasing discolored nasal discharge  Past Medical History  Diagnosis Date  . Obesity, unspecified   . MIGRAINE, COMMON   . OVARIAN CYST   . ALLERGIC RHINITIS   . Anemia     . ANXIETY   . DIABETES MELLITUS, TYPE II   . ADHD (attention deficit hyperactivity disorder)     Review of Systems  Constitutional: Negative for fever. no unexpected weight change Respiratory: Negative shortness of breath.  + ongoing nasal congestion and "head cold" x 1 week Cardiovascular: Negative for chest pain.      Objective:   Physical Exam BP 110/74  Pulse 100  Temp(Src) 98.2 F (36.8 C) (Oral)  Ht 5\' 4"  (1.626 m)  Wt 171 lb (77.565 kg)  BMI 29.35 kg/m2  SpO2 98% Wt Readings from Last 3 Encounters:  10/14/11 171 lb (77.565 kg)  05/07/11 177 lb 12.8 oz (80.65 kg)  01/27/11 189 lb 3.2 oz (85.821 kg)  Constitutional: She is overweight, but well-developed and well-nourished. No distress.  nasal congestion HENT: Mild tenderness to palpation of maxillary sinuses bilaterally, thick yellow nasal discharge with nasal congestion, no polyps or ulceration. Oropharynx clear without exudate. Bilateral TMs without erythema or effusion Neck: Normal range of motion. Neck supple. No JVD present. No thyromegaly present.  Cardiovascular: Normal rate, regular rhythm and normal heart sounds.  No murmur heard. No BLE edema. Pulmonary/Chest: Effort normal and breath sounds normal. No respiratory distress. She has no wheezes.  Psychiatric: She has a normal mood and affect. Her behavior is normal. Judgment and thought content normal.    Lab Results  Component Value Date   WBC 10.8* 09/27/2010   HGB 13.2 09/27/2010   HCT 39.9 09/27/2010   PLT  373 09/27/2010   CHOL 184 11/15/2009   TRIG 158.0* 11/15/2009   HDL 78.00 11/15/2009   ALT 16 09/27/2010   AST 19 09/27/2010   NA 139 09/27/2010   K 3.8 09/27/2010   CL 107 09/27/2010   CREATININE 0.67 09/27/2010   BUN 6 09/27/2010   CO2 24 09/27/2010   TSH 1.77 09/09/2010   HGBA1C 6.1 05/07/2011   MICROALBUR 2.5* 11/20/2009       Assessment & Plan:  See problem list. Medications and labs reviewed today.  Fatigue, hx iron deficiency - check levels now along  with other screening labs  Viral URI>> progression to acute maxillary sinusitis - empiric Z-Pak prescribed given symptom duration greater than one week, continue symptomatic over-the-counter medication support as ongoing

## 2011-10-14 NOTE — Assessment & Plan Note (Signed)
behav testing reviewed - ADHD confirmed on battery testing 01/13/11 -  adderall started and uptitrated to current dose - doing well Continue same - 

## 2011-10-14 NOTE — Assessment & Plan Note (Signed)
tol celexa well - prior sertraline with adv SE (wt gain) - uses BZ prn, less since on ADD tx The current medical regimen is effective;  continue present plan and medications.  Increase symptoms with family deaths fall 2012, but now stable -

## 2011-10-20 ENCOUNTER — Other Ambulatory Visit: Payer: Self-pay | Admitting: *Deleted

## 2011-10-20 MED ORDER — AMPHETAMINE-DEXTROAMPHETAMINE 20 MG PO TABS: 20.0000 mg | ORAL_TABLET | Freq: Two times a day (BID) | ORAL | Status: DC

## 2011-10-20 NOTE — Telephone Encounter (Signed)
Called the patient left message that prescription requested is ready for pickup at the front desk. 

## 2011-10-20 NOTE — Telephone Encounter (Signed)
Pt requesting refill of Adderall-last written 09/04/2011 #60 with 0 refills-VAL pt-please advise

## 2011-10-20 NOTE — Telephone Encounter (Signed)
Done hardcopy to robin  

## 2011-11-11 ENCOUNTER — Other Ambulatory Visit: Payer: Self-pay

## 2011-11-11 MED ORDER — METFORMIN HCL 500 MG PO TABS: 500.0000 mg | ORAL_TABLET | Freq: Two times a day (BID) | ORAL | Status: DC

## 2011-12-05 ENCOUNTER — Other Ambulatory Visit: Payer: Self-pay

## 2011-12-05 NOTE — Telephone Encounter (Signed)
Pt called requesting an increased dose of Adderall as well as refill, please advise.

## 2011-12-08 MED ORDER — AMPHETAMINE-DEXTROAMPHETAMINE 20 MG PO TABS: 20.0000 mg | ORAL_TABLET | Freq: Two times a day (BID) | ORAL | Status: DC

## 2011-12-08 NOTE — Telephone Encounter (Signed)
Will refill current dose adderall but 40mg /day is max dose for treatment of ADD - will need OV to discuss other med options if she feels this med is not effective - thanks

## 2011-12-08 NOTE — Telephone Encounter (Signed)
Pt advised of MD's recommendation as well as increase. Rx in cabinet for pt pick up

## 2012-01-14 ENCOUNTER — Encounter: Payer: BC Managed Care – PPO | Admitting: Internal Medicine

## 2012-01-26 ENCOUNTER — Ambulatory Visit (INDEPENDENT_AMBULATORY_CARE_PROVIDER_SITE_OTHER): Payer: BC Managed Care – PPO | Admitting: Internal Medicine

## 2012-01-26 ENCOUNTER — Other Ambulatory Visit (INDEPENDENT_AMBULATORY_CARE_PROVIDER_SITE_OTHER): Payer: BC Managed Care – PPO

## 2012-01-26 ENCOUNTER — Encounter: Payer: Self-pay | Admitting: Internal Medicine

## 2012-01-26 VITALS — BP 102/78 | HR 87 | Temp 97.9°F | Ht 64.0 in | Wt 167.1 lb

## 2012-01-26 DIAGNOSIS — F411 Generalized anxiety disorder: Secondary | ICD-10-CM

## 2012-01-26 DIAGNOSIS — E119 Type 2 diabetes mellitus without complications: Secondary | ICD-10-CM

## 2012-01-26 DIAGNOSIS — Z23 Encounter for immunization: Secondary | ICD-10-CM

## 2012-01-26 DIAGNOSIS — Z0000 Encounter for general adult medical examination without abnormal findings: Secondary | ICD-10-CM

## 2012-01-26 DIAGNOSIS — Z91018 Allergy to other foods: Secondary | ICD-10-CM

## 2012-01-26 DIAGNOSIS — T781XXA Other adverse food reactions, not elsewhere classified, initial encounter: Secondary | ICD-10-CM

## 2012-01-26 LAB — URINALYSIS, ROUTINE W REFLEX MICROSCOPIC
Hgb urine dipstick: NEGATIVE
Ketones, ur: NEGATIVE
Specific Gravity, Urine: 1.025 (ref 1.000–1.030)
Total Protein, Urine: NEGATIVE
Urine Glucose: NEGATIVE
Urobilinogen, UA: 0.2 (ref 0.0–1.0)

## 2012-01-26 LAB — BASIC METABOLIC PANEL
BUN: 13 mg/dL (ref 6–23)
CO2: 25 mEq/L (ref 19–32)
Calcium: 8.9 mg/dL (ref 8.4–10.5)
GFR: 86.39 mL/min (ref >60.00)
Glucose, Bld: 94 mg/dL (ref 70–99)
Sodium: 138 mEq/L (ref 135–145)

## 2012-01-26 LAB — HEPATIC FUNCTION PANEL
ALT: 12 U/L (ref 0–35)
AST: 14 U/L (ref 0–37)
Alkaline Phosphatase: 92 U/L (ref 39–117)
Bilirubin, Direct: 0 mg/dL (ref 0.0–0.3)
Total Bilirubin: 0.1 mg/dL — ABNORMAL LOW (ref 0.3–1.2)

## 2012-01-26 LAB — CBC WITH DIFFERENTIAL/PLATELET
Basophils Absolute: 0.1 10*3/uL (ref 0.0–0.1)
Basophils Relative: 0.7 % (ref 0.0–3.0)
Eosinophils Absolute: 0.3 10*3/uL (ref 0.0–0.7)
HCT: 41 % (ref 36.0–46.0)
Hemoglobin: 13.2 g/dL (ref 12.0–15.0)
Lymphs Abs: 2.1 10*3/uL (ref 0.7–4.0)
MCHC: 32.3 g/dL (ref 30.0–36.0)
Neutro Abs: 5.6 10*3/uL (ref 1.4–7.7)
RBC: 4.77 Mil/uL (ref 3.87–5.11)
RDW: 13.9 % (ref 11.5–14.6)

## 2012-01-26 LAB — TSH: TSH: 1.98 u[IU]/mL (ref 0.35–5.50)

## 2012-01-26 LAB — LIPID PANEL: Total CHOL/HDL Ratio: 2

## 2012-01-26 MED ORDER — TOPIRAMATE 25 MG PO TABS: 50.0000 mg | ORAL_TABLET | Freq: Two times a day (BID) | ORAL | Status: DC

## 2012-01-26 MED ORDER — GLUCOSE BLOOD VI STRP: ORAL_STRIP | Status: DC

## 2012-01-26 MED ORDER — CITALOPRAM HYDROBROMIDE 20 MG PO TABS: 20.0000 mg | ORAL_TABLET | Freq: Every day | ORAL | Status: DC

## 2012-01-26 MED ORDER — AMPHETAMINE-DEXTROAMPHETAMINE 20 MG PO TABS: 20.0000 mg | ORAL_TABLET | Freq: Two times a day (BID) | ORAL | Status: DC

## 2012-01-26 MED ORDER — TRAMADOL-ACETAMINOPHEN 37.5-325 MG PO TABS: 1.0000 | ORAL_TABLET | Freq: Four times a day (QID) | ORAL | Status: DC | PRN

## 2012-01-26 MED ORDER — EPINEPHRINE 0.3 MG/0.3ML IJ DEVI: 0.3000 mg | Freq: Once | INTRAMUSCULAR | Status: DC

## 2012-01-26 MED ORDER — METFORMIN HCL 500 MG PO TABS: 500.0000 mg | ORAL_TABLET | Freq: Two times a day (BID) | ORAL | Status: DC

## 2012-01-26 MED ORDER — ALPRAZOLAM 0.5 MG PO TABS: 0.5000 mg | ORAL_TABLET | Freq: Three times a day (TID) | ORAL | Status: DC | PRN

## 2012-01-26 NOTE — Assessment & Plan Note (Signed)
tol celexa well, increased to max dose 11/12 prior sertraline with dverse side effects (wt gain) - uses BZ prn, less since on ADD tx The current medical regimen is effective;  continue present plan and medications.  Increase symptoms with family deaths fall 2012, but now stable -

## 2012-01-26 NOTE — Assessment & Plan Note (Signed)
The current medical regimen is effective;  continue present plan and medications.  Reviewed weight trends and adverse effects on metabolic syndrome -   Lab Results  Component Value Date   HGBA1C 6.3 10/14/2011

## 2012-01-26 NOTE — Patient Instructions (Addendum)
It was good to see you today. Health Maintenance reviewed - Tdap updated today -all other recommended immunizations and age-appropriate screenings are up-to-date.  Test(s) ordered today. Your results will be called to you after review (48-72hours after test completion). If any changes need to be made, you will be notified at that time. we'll make referral to allergist for ?nut allergy. Our office will contact you regarding appointment(s) once made. Until evaluation, use Benadryl as needed - or EpiPen if severe swelling or associated shortness of breath  Medications reviewed, no changes at this time. Refill on medication(s) as discussed today.  Please schedule followup in 3-4 months for diabetes mellitus check, call sooner if problems.

## 2012-01-26 NOTE — Progress Notes (Signed)
Subjective:    Patient ID: Lisa Duran, female    DOB: 05-20-1981, 31 y.o.   MRN: [id number]  HPI  patient is here today for annual physical. Patient feels well and has no complaints.  Also reviewed chronic med issues:  anxiety - long history of same - historically controlled symptoms with counseling and biofeedback -  related to increase stress - dad dx with cancer, separation/divorce from spouse 2010, work demands prev intol of zoloft - weight gain, improved on celexa, prn short term xanax (preferred to klonopin) - ssri dose increased briefly 06/2011   ADHD - new dx 01/2011 - confirmed on battery testing with behav health; +FH; started med tx 01/2011 will upward dose titration -symptoms improved, controlled on current treatment  DM2 -  dx 05/2009 - a1c 7.3 then 6.9 after initiating metformin; +FHx - checks cbg at home two times a day: 120-170 range - has met with nutrtion counseling, takes meds 100% and no adv SE on metformin  - off actos since mid 04/2010 due to hypoglycemia, mild; reduced metformin 05/2010 due to good control, increased 04/2011 back to BID  obesity - exercise and diet efforts ongoing - weight loss since initiating same 05/2009, slow "backslide" and reverse trend reviewed-   Common migraines - occurs in cycles -previously every 3-4 mo, now several in row - prev controlled with zomig but changed to maxalt by insurance - no relief with imitrex, relief with tordol - now tramadol/ultracet as needed -ER visit 09/2010 due to new prodromal symptoms 09/2010 - symptoms worse with stress and poor sleep - MRI brain 09/2010 unremarkable - started topamax 10/2010 low dose without resolution but quicker response to med tx - no adv SE - ongoign titration of med  ?new food allergy to tree nuts - lowe lip swelling, chest tightness, ears itch - improves with benadryl - increasing severity and occurence   Past Medical History  Diagnosis Date  . Obesity, unspecified   . MIGRAINE, COMMON   .  OVARIAN CYST   . ALLERGIC RHINITIS   . Anemia   . ANXIETY   . DIABETES MELLITUS, TYPE II   . ADHD (attention deficit hyperactivity disorder)    Family History  Problem Relation Age of Onset  . Diabetes Mother   . Hyperlipidemia Mother   . Diabetes Father   . Hyperlipidemia Father   . Coronary artery disease Father   . Hypertension Father   . Cancer Father     esoph  . Cancer Maternal Grandfather 64    lung, met   History  Substance Use Topics  . Smoking status: Never Smoker   . Smokeless tobacco: Not on file   Comment: single, separated from spouse 02/2010.   [name] Alcohol Use: Yes    Review of Systems Constitutional: Negative for fever or weight change.  Respiratory: Negative for cough and shortness of breath.   Cardiovascular: Negative for chest pain or palpitations.  Gastrointestinal: Negative for abdominal pain, no bowel changes.  Musculoskeletal: Negative for gait problem or joint swelling.  Skin: Negative for rash.  Neurological: Negative for dizziness or headache.  No other specific complaints in a complete review of systems (except as listed in HPI above).      Objective:   Physical Exam BP 102/78  Pulse 87  Temp 97.9 F (36.6 C) (Oral)  Ht 5\' 4"  (1.626 m)  Wt 167 lb 1.9 oz (75.805 kg)  BMI 28.69 kg/m2  SpO2 97% Wt Readings from Last 3 Encounters:  01/26/12  167 lb 1.9 oz (75.805 kg)  10/14/11 171 lb (77.565 kg)  05/07/11 177 lb 12.8 oz (80.65 kg)   Constitutional: She appears well-developed and well-nourished. No distress.  HENT: Head: Normocephalic and atraumatic. Ears: B TMs ok, no erythema or effusion; Nose: Nose normal.  Mouth/Throat: Oropharynx is clear and moist. No oropharyngeal exudate.  Eyes: Conjunctivae and EOM are normal. Pupils are equal, round, and reactive to light. No scleral icterus.  Neck: Normal range of motion. Neck supple. No JVD present. No thyromegaly present.  Cardiovascular: Normal rate, regular rhythm and normal heart sounds.  No  murmur heard. No BLE edema. Pulmonary/Chest: Effort normal and breath sounds normal. No respiratory distress. She has no wheezes.  Abdominal: Soft. Bowel sounds are normal. She exhibits no distension. There is no tenderness. no masses Musculoskeletal: Normal range of motion, no joint effusions. No gross deformities Neurological: She is alert and oriented to person, place, and time. No cranial nerve deficit. Coordination normal.  Skin: Skin is warm and dry. No rash noted. No erythema.  Psychiatric: She has a normal mood and affect. Her behavior is normal. Judgment and thought content normal.    Lab Results  Component Value Date   WBC 6.9 10/14/2011   HGB 13.5 10/14/2011   HCT 40.2 10/14/2011   PLT 334.0 10/14/2011   CHOL 184 11/15/2009   TRIG 158.0* 11/15/2009   HDL 78.00 11/15/2009   ALT 16 09/27/2010   AST 19 09/27/2010   NA 139 09/27/2010   K 3.8 09/27/2010   CL 107 09/27/2010   CREATININE 0.67 09/27/2010   BUN 6 09/27/2010   CO2 24 09/27/2010   TSH 2.43 10/14/2011   HGBA1C 6.3 10/14/2011   MICROALBUR 2.5* 11/20/2009       Assessment & Plan:  CPX/v70.0 - Patient has been counseled on age-appropriate routine health concerns for screening and prevention. These are reviewed and up-to-date. Immunizations are up-to-date or declined. Labs and ECG reviewed.  Also see problem list. Medications and labs reviewed today.  ?Food allergy - nuts = problem by history - refer to allergist and carry EpiPen if Benadryl ineffect

## 2012-03-09 ENCOUNTER — Institutional Professional Consult (permissible substitution): Payer: BC Managed Care – PPO | Admitting: Internal Medicine

## 2012-03-09 ENCOUNTER — Other Ambulatory Visit: Payer: Self-pay

## 2012-03-10 MED ORDER — AMPHETAMINE-DEXTROAMPHETAMINE 20 MG PO TABS: 20.0000 mg | ORAL_TABLET | Freq: Two times a day (BID) | ORAL | Status: DC

## 2012-03-10 NOTE — Telephone Encounter (Signed)
Pt informed via VM, Rx in cabinet for pt pick up  

## 2012-04-28 ENCOUNTER — Ambulatory Visit: Payer: BC Managed Care – PPO | Admitting: Internal Medicine

## 2012-05-19 ENCOUNTER — Other Ambulatory Visit: Payer: Self-pay | Admitting: Internal Medicine

## 2012-05-19 NOTE — Addendum Note (Signed)
Addended by: Rene Paci A on: 05/19/2012 05:54 PM   Modules accepted: Orders

## 2012-05-19 NOTE — Telephone Encounter (Signed)
Ok to fill as requested.

## 2012-05-19 NOTE — Telephone Encounter (Signed)
Caller: Lisa Duran/Patient; Patient Name: Lisa Duran; PCP: Lisa Duran (Adults only); Best Callback Phone Number: [phone]; Call regarding: Medication Refill; calling to get rx for Adderall 20mg  BID; please call when ready to pick up

## 2012-05-20 MED ORDER — AMPHETAMINE-DEXTROAMPHETAMINE 20 MG PO TABS: 20.0000 mg | ORAL_TABLET | Freq: Two times a day (BID) | ORAL | Status: DC

## 2012-05-20 NOTE — Telephone Encounter (Signed)
Rx printed, awaiting MD's signature.  

## 2012-05-20 NOTE — Telephone Encounter (Signed)
Pt informed rx ready for pickup via VM.

## 2012-05-20 NOTE — Addendum Note (Signed)
Addended by: Carin Primrose on: 05/20/2012 08:03 AM   Modules accepted: Orders

## 2012-07-16 ENCOUNTER — Telehealth: Payer: Self-pay | Admitting: Internal Medicine

## 2012-07-16 MED ORDER — AMPHETAMINE-DEXTROAMPHETAMINE 20 MG PO TABS: 20.0000 mg | ORAL_TABLET | Freq: Two times a day (BID) | ORAL | Status: DC

## 2012-07-16 NOTE — Telephone Encounter (Signed)
Is needing script for Adderall 20mg  BID. PLEASE CALL WHEN CAN PICK UP [facility] [phone].

## 2012-07-16 NOTE — Telephone Encounter (Signed)
ok 

## 2012-07-19 NOTE — Telephone Encounter (Signed)
Called pt no answer LMOM rx ready for pick-up.../lmb 

## 2012-07-28 ENCOUNTER — Ambulatory Visit: Payer: BC Managed Care – PPO | Admitting: Internal Medicine

## 2012-08-05 ENCOUNTER — Ambulatory Visit (INDEPENDENT_AMBULATORY_CARE_PROVIDER_SITE_OTHER): Payer: BC Managed Care – PPO | Admitting: Internal Medicine

## 2012-08-05 ENCOUNTER — Other Ambulatory Visit (INDEPENDENT_AMBULATORY_CARE_PROVIDER_SITE_OTHER): Payer: BC Managed Care – PPO

## 2012-08-05 ENCOUNTER — Encounter: Payer: Self-pay | Admitting: Internal Medicine

## 2012-08-05 VITALS — BP 110/80 | HR 125 | Temp 99.0°F | Ht 64.0 in | Wt 178.4 lb

## 2012-08-05 DIAGNOSIS — E119 Type 2 diabetes mellitus without complications: Secondary | ICD-10-CM

## 2012-08-05 DIAGNOSIS — Z23 Encounter for immunization: Secondary | ICD-10-CM

## 2012-08-05 DIAGNOSIS — F411 Generalized anxiety disorder: Secondary | ICD-10-CM

## 2012-08-05 DIAGNOSIS — F909 Attention-deficit hyperactivity disorder, unspecified type: Secondary | ICD-10-CM

## 2012-08-05 MED ORDER — ALPRAZOLAM 0.5 MG PO TABS: 0.5000 mg | ORAL_TABLET | Freq: Three times a day (TID) | ORAL | Status: DC | PRN

## 2012-08-05 NOTE — Assessment & Plan Note (Signed)
tolerating celexa well, increased to max dose 11/12 prior sertraline with dverse side effects (wt gain) - uses BZ prn, less since on ADD tx The current medical regimen is effective;  continue present plan and medications.  Increase symptoms with family deaths fall 2012, but now stable - refills today

## 2012-08-05 NOTE — Assessment & Plan Note (Signed)
The current medical regimen is effective;  continue present plan and medications.  Reviewed weight trends and adverse effects on metabolic syndrome -  Requests eval for  ?LAD by endo - will refer now  Lab Results  Component Value Date   HGBA1C 6.2 01/26/2012

## 2012-08-05 NOTE — Progress Notes (Signed)
Subjective:    Patient ID: Lisa Duran, female    DOB: 01-Nov-1980, 31 y.o.   MRN: [id number]  HPI  Here for follow up - reviewed chronic medical issues:  anxiety - long history of same - historically controlled symptoms with counseling and biofeedback -  related to increase stress - dad dx with cancer, separation/divorce from spouse 2010, work demands prev intol of zoloft - weight gain, improved on celexa, prn short term xanax (preferred to klonopin) - ssri dose increased briefly 06/2011   ADHD - new dx 01/2011 - confirmed on battery testing with behav health; +FH; started med tx 01/2011 will upward dose titration -symptoms improved, controlled on current treatment  DM2 -  dx 05/2009 - a1c 7.3 then 6.9 after initiating metformin; +FHx - checks cbg at home two times a day: 120-170 range - has met with nutrtion counseling, takes meds 100% and no adv SE on metformin  - off actos since mid 04/2010 due to hypoglycemia, mild; reduced metformin 05/2010 due to good control, increased 04/2011 back to BID  obesity - exercise and diet efforts ongoing - weight loss since initiating same 05/2009, slow "backslide" and reverse trend reviewed-   Common migraines - occurs in cycles -previously every 3-4 mo, now several in row - prev controlled with zomig but changed to maxalt by insurance - no relief with imitrex, relief with tordol - now tramadol/ultracet as needed -ER visit 09/2010 due to new prodromal symptoms 09/2010 - symptoms worse with stress and poor sleep - MRI brain 09/2010 unremarkable - started topamax 10/2010 low dose without resolution but quicker response to med tx - no adv side effects   Past Medical History  Diagnosis Date  . Obesity, unspecified   . MIGRAINE, COMMON   . OVARIAN CYST   . ALLERGIC RHINITIS   . Anemia   . ANXIETY   . DIABETES MELLITUS, TYPE II   . ADHD (attention deficit hyperactivity disorder)     Review of Systems  Respiratory: Negative for cough and shortness of breath.    Cardiovascular: Negative for chest pain or palpitations.       Objective:   Physical Exam BP 110/80  Pulse 125  Temp 99 F (37.2 C) (Oral)  Ht 5\' 4"  (1.626 m)  Wt 178 lb 6.4 oz (80.922 kg)  BMI 30.62 kg/m2  SpO2 98% Wt Readings from Last 3 Encounters:  08/05/12 178 lb 6.4 oz (80.922 kg)  01/26/12 167 lb 1.9 oz (75.805 kg)  10/14/11 171 lb (77.565 kg)   Constitutional: She is overweight, but appears well-developed and well-nourished. No distress.  Neck: Normal range of motion. Neck supple. No JVD present. No thyromegaly present.  Cardiovascular: Normal rate, regular rhythm and normal heart sounds.  No murmur heard. No BLE edema. Pulmonary/Chest: Effort normal and breath sounds normal. No respiratory distress. She has no wheezes.  Psychiatric: She has a normal mood and affect. Her behavior is normal. Judgment and thought content normal.    Lab Results  Component Value Date   WBC 8.4 01/26/2012   HGB 13.2 01/26/2012   HCT 41.0 01/26/2012   PLT 365.0 01/26/2012   CHOL 174 01/26/2012   TRIG 74.0 01/26/2012   HDL 82.30 01/26/2012   ALT 12 01/26/2012   AST 14 01/26/2012   NA 138 01/26/2012   K 3.9 01/26/2012   CL 106 01/26/2012   CREATININE 0.8 01/26/2012   BUN 13 01/26/2012   CO2 25 01/26/2012   TSH 1.98 01/26/2012   HGBA1C 6.2  01/26/2012   MICROALBUR 2.5* 11/20/2009       Assessment & Plan:   see problem list. Medications and labs reviewed today.

## 2012-08-05 NOTE — Assessment & Plan Note (Signed)
behav testing reviewed - ADHD confirmed on battery testing 01/13/11 -  adderall started and uptitrated to current dose - doing well Continue same -

## 2012-08-05 NOTE — Patient Instructions (Signed)
It was good to see you today. We have reviewed your prior records including labs and tests today Test(s) ordered today. Your results will be released to MyChart (or called to you) after review, usually within 72hours after test completion. If any changes need to be made, you will be notified at that same time. we'll make referral to Dr Soundra Pilon for endocrine review . Our office will contact you regarding appointment(s) once made. Flu shot today - Medications reviewed, updated and refilled as requested, no changes at this time.  Work on lifestyle changes as discussed (low fat, low carb, increased protein diet; improved exercise efforts; weight loss) to control sugar, blood pressure and cholesterol levels and/or reduce risk of developing other medical problems. Look into [email or web address] or other type of food journal to assist you in this process. Please schedule followup in 4 months, call sooner if problems.

## 2012-08-09 ENCOUNTER — Ambulatory Visit: Payer: BC Managed Care – PPO | Admitting: Internal Medicine

## 2012-08-13 ENCOUNTER — Encounter: Payer: Self-pay | Admitting: Internal Medicine

## 2012-08-17 ENCOUNTER — Ambulatory Visit (INDEPENDENT_AMBULATORY_CARE_PROVIDER_SITE_OTHER): Payer: BC Managed Care – PPO | Admitting: Internal Medicine

## 2012-08-17 ENCOUNTER — Encounter: Payer: Self-pay | Admitting: Internal Medicine

## 2012-08-17 VITALS — BP 100/68 | HR 101 | Temp 97.6°F | Resp 16 | Ht 64.5 in | Wt 180.0 lb

## 2012-08-17 DIAGNOSIS — R5381 Other malaise: Secondary | ICD-10-CM

## 2012-08-17 DIAGNOSIS — R5383 Other fatigue: Secondary | ICD-10-CM

## 2012-08-17 DIAGNOSIS — D509 Iron deficiency anemia, unspecified: Secondary | ICD-10-CM

## 2012-08-17 DIAGNOSIS — E119 Type 2 diabetes mellitus without complications: Secondary | ICD-10-CM

## 2012-08-17 NOTE — Patient Instructions (Addendum)
Please return in 3 months. Please let me know if you have any questions.  Carlus Pavlov, MD PhD [facility] [address] [place], [place] [address] Tel. [phone] Fax [phone]

## 2012-08-17 NOTE — Progress Notes (Signed)
Subjective:     Patient ID: Lisa Duran, female   DOB: October 29, 1980, 32 y.o.   MRN: [id number]  HPI Ms Sokol is a pleasant 32 y/o woman referred by PCP, Dr. Felicity Coyer, for evaluation for LADA (latent autoimmune diabetes of the adult).  Pt was dx with DM2 in 05/2009, when she presented to her ObGyn with many UTIs and feeling poorly. CBGs were found high. She has been initially started on Metformin, at the maximum dose, but then decreased to 500 mg bid due to good control. She was also on Actos, but taken off b/c good control.  She checks sugars 2x a day: - am: 90-120, today 179 - 1-2h post dinner: 180-190 Gained 10 lbs in last 2 mo, has "deep" fatigue.  She feels a sugar of 80 > hypoglycemia awareness. Lowest sugars 60's, had few episodes of low sugars lately, in the evenings, before dinner. Highest sugar: 190 in the last few months.   No complications from DM. She sees [facility] in [place] every year. Has numbness and tingling in hands and feet, throughout the day. She has increased thirst and urination. No UTI per eval. by ObGyn.   Father was dx in his 7s with type 2 DM and had a stroke and a heart attack in his 13s. He had pancreatitis. Both of father's parents were diabetics and other family members on her father's side were dx with DM early in life. Mother has been dx with DM in her 70's and had GDM, and pts sisters had GDM. None of her family members are overweight. She has a FH of Graves Ds in maternal aunt.  Pt also c/o hair loss in last year related to stress (dad died last 08/02/2023 2/2 cancer) - telogenic effluvium, now stable. She also c/o muscle fatigue, poor sleep, frequent urination - 10 x at work during the day. Menstrual cycles are normal, on OCP for a long time, since 32 y/o (not changed recently). She still has menstrual cramps. She is temperature-sensitive. She has severe anxiety, better lately with behavioral tx and her Xanax. She has eczema, better lately, not using her  steroid cream in last 6 mo. Did not use Nasonex for a long time.  She takes iron, vitamin D 2000 IU.  Past Medical History  Diagnosis Date  . Obesity, unspecified   . MIGRAINE, COMMON   . OVARIAN CYST   . ALLERGIC RHINITIS   . Anemia   . ANXIETY   . DIABETES MELLITUS, TYPE II   . ADHD (attention deficit hyperactivity disorder)    History reviewed. No pertinent past surgical history.  History   Social History  . Marital Status: Married    Spouse Name: N/A    Number of Children: 0  . Years of Education: N/A   Occupational History  . Marketing executive   Social History Main Topics  . Smoking status: Was smoking 1/2 PPD, quit 2009  . Smokeless tobacco: No     Comment: single, separated from spouse 02/2010.   [name] Alcohol Use: Yes, mixed, 2-4 x mo, 1-2 drinks at a time  . Drug Use: No   Social History Narrative   Single, separated from spouse 02/2010   Current Outpatient Prescriptions on File Prior to Visit  Medication Sig Dispense Refill  . ALPRAZolam (XANAX) 0.5 MG tablet Take 1 tablet (0.5 mg total) by mouth 3 (three) times daily as needed.  30 tablet  1  . amphetamine-dextroamphetamine (ADDERALL) 20 MG tablet Take 1  tablet (20 mg total) by mouth 2 (two) times daily.  60 tablet  0  . aspirin 81 MG tablet Take 81 mg by mouth daily.        . Cholecalciferol (VITAMIN D) 2000 UNITS CAPS Take by mouth daily.        . citalopram (CELEXA) 20 MG tablet Take 1 tablet (20 mg total) by mouth daily.  90 tablet  1  . EPINEPHrine (EPIPEN) 0.3 mg/0.3 mL DEVI Inject 0.3 mLs (0.3 mg total) into the muscle once.  1 Device  0  . ferrous sulfate (IRON SUPPLEMENT) 325 (65 FE) MG tablet Take 325 mg by mouth daily with breakfast.        . glucose blood (TRUETEST TEST) test strip Use as instructed  100 each  1  . metFORMIN (GLUCOPHAGE) 500 MG tablet Take 1 tablet (500 mg total) by mouth 2 (two) times daily with a meal.  180 tablet  1  . mometasone (NASONEX) 50 MCG/ACT nasal spray 2 sprays by  Nasal route daily.        . Norgestimate-Ethinyl Estradiol Triphasic (TRI-SPRINTEC) 0.18/0.215/0.25 MG-35 MCG tablet Take 1 tablet by mouth daily.      . promethazine (PHENERGAN) 25 MG tablet Take 1 tablet (25 mg total) by mouth every 6 (six) hours as needed.  30 tablet  1  . RELPAX 40 MG tablet TAKE AS DIRECTED BY PHYSICIAN  (CMT)  8 tablet  0  . topiramate (TOPAMAX) 25 MG tablet Take 2 tablets (50 mg total) by mouth 2 (two) times daily.  360 tablet  0  . traMADol-acetaminophen (ULTRACET) 37.5-325 MG per tablet Take 1 tablet by mouth every 6 (six) hours as needed.  90 tablet  0  . triamcinolone (KENALOG) 0.1 % cream Apply topically 2 (two) times daily as needed.  30 g  1   No Known Allergies  Family History  Problem Relation Age of Onset  . Diabetes Mother   . Hyperlipidemia Mother   . Diabetes Father   . Hyperlipidemia Father   . Coronary artery disease Father   . Hypertension Father   . Cancer Father     esoph  . Cancer Maternal Grandfather 55    lung, met   Review of Systems Constitutional: + weight gain 10 lbs in 1 mo/loss, + fatigue, + subjective hyperthermia/hypothermia, + increased appetite Eyes: + blurry vision with low sugars and high sugars, + xerophthalmia ENT: no sore throat, no nodules palpated in throat, no dysphagia/odynophagia, no hoarseness Cardiovascular: no CP/SOB/palpitations/ only occasionally leg swelling Respiratory: no cough/SOB Gastrointestinal: no N/V/D/ but lately has constipation. Has heartburn. Musculoskeletal: no muscle/joint aches but has mm weakness in legs but in her back, too - in last 2 months Skin: eczema is better, rash on chest, back - new, easy bruising Neurological: no tremors/dizziness, + HAs Psychiatric: + depression (on Celexa + CBT)/+ anxiety OTW per HPI    Objective:   Physical Exam BP 100/68  Pulse 101  Temp 97.6 F (36.4 C) (Oral)  Resp 16  Ht 5' 4.5" (1.638 m)  Wt 180 lb (81.647 kg)  BMI 30.42 kg/m2  SpO2 97% LMP  08/08/2012 Constitutional: overweight, in NAD Eyes: PERRLA, EOMI, no exophthalmos ENT: moist mucous membranes, no thyromegaly, no cervical lymphadenopathy Cardiovascular: RRR, No MRG Respiratory: CTA B Gastrointestinal: abdomen soft, NT, ND, BS+ Musculoskeletal: no deformities, strength intact in all 4 Skin: moist, warm, rosacea-like rash on upper chest Neurological: no tremor with outstretched hands, DTR normal in all 4  Assessment:     1. DM2 - non-insulin-dependent, w/o complications - ? LADA - BMI 30, but LADA pts can be overweight - on Metformin 500 mg bid, tolerated well  2. Fatigue - h/o IDA, now on iron >> Hb normal - h/o vit D def.  - FH of autoimmune ds (Graves Ds in aunt) - was not tested for celiac ds in the past - TSH normal  3. Numbness and tingling in arms, legs - normal CMP - on Metformin - not on B12 supplements    Plan:     1. DM2 - Pt concerned about the possibility of LADA (Latent autoimmune diabetes of the adult). Discussed the diagnosis, that usually pts with this are mis-dx as DM2, they are young (30s), and have antibodies against pancreatic beta cell islets. Also discussed that, phenotypically, they are between DM1 and DM2 (named DM 1.5).  - we will check: Orders Placed This Encounter  Procedures  . Glutamic acid decarboxylase auto abs  . Anti-islet cell antibody  . C-peptide  . Glucose  . Tissue transglutaminase, IgA  . Tissue transglutaminase, IgG  . Gliadin antibodies, serum  - stay on the Metformin at current dose - start monitoring sugars 2h post meal (not 1h) and rotate the time of checks between the main meals - will check celiac panel since she has fatigue, h/o IDA without excess menstrual blood loss (now Hb normal on iron supplement), and h/o vit D deficiency (now on 2000 iu daily) - advised her to start B complex - for numbness and tingling She is fasting now.  - explained that a dx of LADA will not change our management much (early  insulinization is controversial), but will allow [place] to be more vigilant with her DM - I will see her back in 3 mo with her sugar log  2. Fatigue - will check celiac panel. Other labs normal.  3. Numbness and tingling arms and legs - advised her to take B-complex as Metformin tx was shown to cause B12 def. (although studies not robust)  Office Visit on 08/17/2012  Component Date Value Range Status  . Glutamic Acid Decarb Ab 08/17/2012 <1.0  <=1.0 U/mL Final  . Pancreatic Islet Cell Antibody 08/17/2012 <5  < 5 JDF Units Final   Comment:                             Laboratory Developed Test performed using a reagent labeled by                           the manufacturer as ASR Class I or ASR Class II (non-blood bank).                                                      This test(s) was developed and its performance characteristics                           have been determined by [employer],                           [employer], [place]. It has not been cleared or approved by the U.S.  Food and Drug Administration. The FDA has determined that such                           clearance or approval is not necessary. Performance                           characteristics refer to the analytical performance of the test.  . C-Peptide 08/17/2012 1.84  0.80 - 3.90 ng/mL Final  . Glucose, Bld 08/17/2012 104* 70 - 99 mg/dL Final  . Tissue Transglutaminase Ab, IgA 08/17/2012 3.5  <20 U/mL Final   Comment:    Reference Range:                                  <20     Negative                                  20-25   Equivocal                                  >25     Positive                                                     Between 2-3% of Celiac patients have selective IgA deficiency. If the                          tTG IgA result is negative but Celiac disease is still suspected,                          total IgA should be measured to identify possible  selective IgA                          deficiency and to rule out a false negative. In cases of IgA                          deficiency, measurement of tTG IgG should be considered.                             . Tissue Transglut Ab 08/17/2012 11.0  <20 U/mL Final   Comment:    Reference Range:                                  <20     Negative                                  20-25   Equivocal                                  >25  Positive                             . Gliadin IgG 08/17/2012 12.4  <20 U/mL Final   Comment:    Reference Range:                                  <20     Negative                                  20-25   Equivocal                                  >25     Positive                             . Gliadin IgA 08/17/2012 1.9  <20 U/mL Final   Comment:    Reference Range:                                  <20     Negative                                  20-25   Equivocal                                  >25     Positive                             Appointment on 08/05/2012  Component Date Value Range Status  . Hemoglobin A1C 08/05/2012 6.6* 4.6 - 6.5 % Final   Glycemic Control Guidelines for People with Diabetes:Non Diabetic:  <6%Goal of Therapy: <7%Additional Action Suggested:  >8%   Pt informed that it does not appear she has celiac disease. Her C-peptide is detectable. HbA1c great!  New message sent to pt through MyChart:  Dear Ms. Victory Dakin, The 2 antibodies are undetectable, so most likely you do not have LADA. As we discussed at last visit, there are other antibodies that can be present, but very uncommonly, and not as much associated with insulin-dependence as the ones that we tested. Please let me know if you have any questions. Sincerely, Carlus Pavlov MD

## 2012-08-18 LAB — C-PEPTIDE: C-Peptide: 1.84 ng/mL (ref 0.80–3.90)

## 2012-08-18 LAB — GLIADIN ANTIBODIES, SERUM: Gliadin IgG: 12.4 U/mL (ref <20)

## 2012-08-24 ENCOUNTER — Encounter: Payer: Self-pay | Admitting: Internal Medicine

## 2012-08-24 LAB — GLUTAMIC ACID DECARBOXYLASE AUTO ABS: Glutamic Acid Decarb Ab: <1 U/mL (ref <=1.0)

## 2012-08-24 LAB — ANTI-ISLET CELL ANTIBODY: Pancreatic Islet Cell Antibody: <5 JDF Units (ref <5)

## 2012-08-31 ENCOUNTER — Ambulatory Visit (INDEPENDENT_AMBULATORY_CARE_PROVIDER_SITE_OTHER): Payer: BC Managed Care – PPO | Admitting: Licensed Clinical Social Worker

## 2012-08-31 DIAGNOSIS — F3189 Other bipolar disorder: Secondary | ICD-10-CM

## 2012-09-13 ENCOUNTER — Ambulatory Visit (INDEPENDENT_AMBULATORY_CARE_PROVIDER_SITE_OTHER): Payer: BC Managed Care – PPO | Admitting: Licensed Clinical Social Worker

## 2012-09-13 DIAGNOSIS — F3189 Other bipolar disorder: Secondary | ICD-10-CM

## 2012-09-20 ENCOUNTER — Telehealth: Payer: Self-pay | Admitting: *Deleted

## 2012-09-20 ENCOUNTER — Encounter: Payer: Self-pay | Admitting: Internal Medicine

## 2012-09-20 MED ORDER — ALPRAZOLAM 0.5 MG PO TABS: 0.5000 mg | ORAL_TABLET | Freq: Three times a day (TID) | ORAL | Status: DC | PRN

## 2012-09-20 MED ORDER — AMPHETAMINE-DEXTROAMPHETAMINE 20 MG PO TABS: 20.0000 mg | ORAL_TABLET | Freq: Two times a day (BID) | ORAL | Status: DC

## 2012-09-20 NOTE — Telephone Encounter (Signed)
Ok to refill as requested - pt will need to pick up her adderall

## 2012-09-20 NOTE — Telephone Encounter (Signed)
Notified pt rx ready for pick-up.../lmb 

## 2012-10-11 LAB — HM PAP SMEAR

## 2012-10-29 LAB — HM DIABETES EYE EXAM

## 2012-11-01 ENCOUNTER — Encounter: Payer: Self-pay | Admitting: Internal Medicine

## 2012-11-15 ENCOUNTER — Ambulatory Visit: Payer: BC Managed Care – PPO | Admitting: Internal Medicine

## 2012-11-15 ENCOUNTER — Encounter: Payer: Self-pay | Admitting: Internal Medicine

## 2012-12-07 ENCOUNTER — Ambulatory Visit: Payer: BC Managed Care – PPO | Admitting: Internal Medicine

## 2012-12-13 ENCOUNTER — Ambulatory Visit: Payer: BC Managed Care – PPO | Admitting: Internal Medicine

## 2012-12-13 DIAGNOSIS — Z0289 Encounter for other administrative examinations: Secondary | ICD-10-CM

## 2013-01-04 ENCOUNTER — Encounter: Payer: Self-pay | Admitting: Internal Medicine

## 2013-01-04 ENCOUNTER — Other Ambulatory Visit: Payer: Self-pay | Admitting: Internal Medicine

## 2013-01-04 MED ORDER — AMPHETAMINE-DEXTROAMPHETAMINE 20 MG PO TABS: 20.0000 mg | ORAL_TABLET | Freq: Two times a day (BID) | ORAL | Status: DC

## 2013-01-11 ENCOUNTER — Encounter: Payer: Self-pay | Admitting: Internal Medicine

## 2013-01-11 ENCOUNTER — Ambulatory Visit (INDEPENDENT_AMBULATORY_CARE_PROVIDER_SITE_OTHER): Payer: BC Managed Care – PPO | Admitting: Internal Medicine

## 2013-01-11 ENCOUNTER — Other Ambulatory Visit (INDEPENDENT_AMBULATORY_CARE_PROVIDER_SITE_OTHER): Payer: BC Managed Care – PPO

## 2013-01-11 VITALS — BP 112/84 | HR 96 | Temp 98.4°F | Wt 196.8 lb

## 2013-01-11 DIAGNOSIS — E119 Type 2 diabetes mellitus without complications: Secondary | ICD-10-CM

## 2013-01-11 DIAGNOSIS — E669 Obesity, unspecified: Secondary | ICD-10-CM

## 2013-01-11 DIAGNOSIS — G43009 Migraine without aura, not intractable, without status migrainosus: Secondary | ICD-10-CM

## 2013-01-11 DIAGNOSIS — F411 Generalized anxiety disorder: Secondary | ICD-10-CM

## 2013-01-11 LAB — LIPID PANEL
HDL: 84.1 mg/dL (ref >39.00)
Triglycerides: 97 mg/dL (ref 0.0–149.0)

## 2013-01-11 LAB — MICROALBUMIN / CREATININE URINE RATIO
Creatinine,U: 193.5 mg/dL
Microalb Creat Ratio: 0.4 mg/g (ref 0.0–30.0)

## 2013-01-11 LAB — BASIC METABOLIC PANEL
CO2: 23 mEq/L (ref 19–32)
Calcium: 9.2 mg/dL (ref 8.4–10.5)
Creatinine, Ser: 0.7 mg/dL (ref 0.4–1.2)
Glucose, Bld: 129 mg/dL — ABNORMAL HIGH (ref 70–99)

## 2013-01-11 LAB — HEMOGLOBIN A1C: Hgb A1c MFr Bld: 6.3 % (ref 4.6–6.5)

## 2013-01-11 MED ORDER — CITALOPRAM HYDROBROMIDE 20 MG PO TABS: 20.0000 mg | ORAL_TABLET | Freq: Every day | ORAL | Status: DC

## 2013-01-11 MED ORDER — ALPRAZOLAM 0.5 MG PO TABS: 0.5000 mg | ORAL_TABLET | Freq: Three times a day (TID) | ORAL | Status: DC | PRN

## 2013-01-11 MED ORDER — TOPIRAMATE 25 MG PO TABS: 50.0000 mg | ORAL_TABLET | Freq: Two times a day (BID) | ORAL | Status: DC

## 2013-01-11 MED ORDER — ELETRIPTAN HYDROBROMIDE 40 MG PO TABS: ORAL_TABLET | ORAL | Status: DC

## 2013-01-11 MED ORDER — GLUCOSE BLOOD VI STRP: ORAL_STRIP | Status: DC

## 2013-01-11 NOTE — Assessment & Plan Note (Addendum)
The current medical regimen is effective;  continue present plan and medications.  Reviewed weight trends and adverse effects on metabolic syndrome -  Consult by endo early 2014, but plans to follow here  Lab Results  Component Value Date   HGBA1C 6.6* 08/05/2012

## 2013-01-11 NOTE — Assessment & Plan Note (Signed)
tolerating celexa well, increased to max dose 11/12 prior sertraline with dverse side effects (wt gain) - uses BZ prn - also on ADD tx Ongoing psychological eval - counseling with Judithe Modest and med management with nurse practitioner Excell Seltzer Reviewed addition of Lamictal spring 2014 Increase symptoms with family deaths fall 2012 refills today

## 2013-01-11 NOTE — Assessment & Plan Note (Signed)
MRI brain 09/2010 unremarkable- started on topamax for prevention 10/2010 -  01/2011 titrate up dose - improved  Continue as needed Relpax for acute event as needed, refill today

## 2013-01-11 NOTE — Progress Notes (Signed)
  Subjective:    Patient ID: Lisa Duran, female    DOB: August 01, 1981, 32 y.o.   MRN: [id number]  HPI  Here for follow up - reviewed chronic medical issues: DM2, anxiety/bipolar 2, obesity, migraines, ADHD   Past Medical History  Diagnosis Date  . Obesity, unspecified   . MIGRAINE, COMMON   . OVARIAN CYST   . ALLERGIC RHINITIS   . Anemia   . ANXIETY   . DIABETES MELLITUS, TYPE II   . ADHD (attention deficit hyperactivity disorder)     Review of Systems  Constitutional: Positive for unexpected weight change. Negative for fever and activity change.  Respiratory: Negative for cough and shortness of breath.   Cardiovascular: Negative for chest pain and palpitations.       Objective:   Physical Exam BP 112/84  Pulse 96  Temp(Src) 98.4 F (36.9 C) (Oral)  Wt 196 lb 12.8 oz (89.268 kg)  BMI 33.27 kg/m2  SpO2 97% Wt Readings from Last 3 Encounters:  01/11/13 196 lb 12.8 oz (89.268 kg)  08/17/12 180 lb (81.647 kg)  08/05/12 178 lb 6.4 oz (80.922 kg)   Constitutional: She is overweight, but appears well-developed and well-nourished. No distress.  Neck: Normal range of motion. Neck supple. No JVD present. No thyromegaly present.  Cardiovascular: Normal rate, regular rhythm and normal heart sounds.  No murmur heard. No BLE edema. Pulmonary/Chest: Effort normal and breath sounds normal. No respiratory distress. She has no wheezes.  Psychiatric: She has an anxious mood and affect. Her behavior is normal. Judgment and thought content normal.    Lab Results  Component Value Date   WBC 8.4 01/26/2012   HGB 13.2 01/26/2012   HCT 41.0 01/26/2012   PLT 365.0 01/26/2012   CHOL 174 01/26/2012   TRIG 74.0 01/26/2012   HDL 82.30 01/26/2012   ALT 12 01/26/2012   AST 14 01/26/2012   NA 138 01/26/2012   K 3.9 01/26/2012   CL 106 01/26/2012   CREATININE 0.8 01/26/2012   BUN 13 01/26/2012   CO2 25 01/26/2012   TSH 1.98 01/26/2012   HGBA1C 6.6* 08/05/2012   MICROALBUR 2.5* 11/20/2009        Assessment & Plan:   see problem list. Medications and labs reviewed today.

## 2013-01-11 NOTE — Patient Instructions (Signed)
It was good to see you today.  We have reviewed your prior records including labs and tests today  Test(s) ordered today. Your results will be released to MyChart (or called to you) after review, usually within 72hours after test completion. If any changes need to be made, you will be notified at that same time.  Medications reviewed and updated, no changes recommended at this time. Refill on medication(s) as discussed today.  Please schedule followup in 3-4 months, call sooner if problems.  

## 2013-01-11 NOTE — Assessment & Plan Note (Signed)
Weight trends reviewed Wt Readings from Last 3 Encounters:  01/11/13 196 lb 12.8 oz (89.268 kg)  08/17/12 180 lb (81.647 kg)  08/05/12 178 lb 6.4 oz (80.922 kg)   The patient is asked to make an attempt to improve diet and exercise patterns to aid in medical management of this problem.

## 2013-01-16 ENCOUNTER — Encounter (HOSPITAL_COMMUNITY): Payer: Self-pay | Admitting: *Deleted

## 2013-01-16 ENCOUNTER — Encounter (HOSPITAL_COMMUNITY): Payer: Self-pay

## 2013-01-16 ENCOUNTER — Emergency Department (HOSPITAL_COMMUNITY)
Admission: EM | Admit: 2013-01-16 | Discharge: 2013-01-17 | Disposition: A | Discharge status: Home / Self Care | Payer: BC Managed Care – PPO | Attending: Emergency Medicine | Admitting: Emergency Medicine

## 2013-01-16 ENCOUNTER — Emergency Department (HOSPITAL_COMMUNITY)
Admission: EM | Admit: 2013-01-16 | Discharge: 2013-01-16 | Disposition: A | Discharge status: Other Acute Inpatient Hospital | Payer: BC Managed Care – PPO | Source: Home / Self Care | Attending: Emergency Medicine | Admitting: Emergency Medicine

## 2013-01-16 ENCOUNTER — Emergency Department (HOSPITAL_COMMUNITY): Payer: BC Managed Care – PPO

## 2013-01-16 DIAGNOSIS — R1011 Right upper quadrant pain: Secondary | ICD-10-CM

## 2013-01-16 DIAGNOSIS — F909 Attention-deficit hyperactivity disorder, unspecified type: Secondary | ICD-10-CM | POA: Insufficient documentation

## 2013-01-16 DIAGNOSIS — R1013 Epigastric pain: Secondary | ICD-10-CM

## 2013-01-16 DIAGNOSIS — R1110 Vomiting, unspecified: Secondary | ICD-10-CM | POA: Insufficient documentation

## 2013-01-16 DIAGNOSIS — Z79899 Other long term (current) drug therapy: Secondary | ICD-10-CM | POA: Insufficient documentation

## 2013-01-16 DIAGNOSIS — F411 Generalized anxiety disorder: Secondary | ICD-10-CM | POA: Insufficient documentation

## 2013-01-16 DIAGNOSIS — E119 Type 2 diabetes mellitus without complications: Secondary | ICD-10-CM | POA: Insufficient documentation

## 2013-01-16 DIAGNOSIS — D649 Anemia, unspecified: Secondary | ICD-10-CM | POA: Insufficient documentation

## 2013-01-16 DIAGNOSIS — G43909 Migraine, unspecified, not intractable, without status migrainosus: Secondary | ICD-10-CM | POA: Insufficient documentation

## 2013-01-16 DIAGNOSIS — Z7982 Long term (current) use of aspirin: Secondary | ICD-10-CM | POA: Insufficient documentation

## 2013-01-16 DIAGNOSIS — R109 Unspecified abdominal pain: Secondary | ICD-10-CM

## 2013-01-16 DIAGNOSIS — E669 Obesity, unspecified: Secondary | ICD-10-CM | POA: Insufficient documentation

## 2013-01-16 LAB — CBC WITH DIFFERENTIAL/PLATELET
Basophils Absolute: 0 K/uL (ref 0.0–0.1)
Basophils Relative: 0 % (ref 0–1)
Eosinophils Absolute: 0 K/uL (ref 0.0–0.7)
Eosinophils Relative: 0 % (ref 0–5)
HCT: 43.2 % (ref 36.0–46.0)
Hemoglobin: 14.9 g/dL (ref 12.0–15.0)
Lymphocytes Relative: 4 % — ABNORMAL LOW (ref 12–46)
Lymphs Abs: 0.7 K/uL (ref 0.7–4.0)
MCH: 27.9 pg (ref 26.0–34.0)
MCHC: 34.5 g/dL (ref 30.0–36.0)
MCV: 80.7 fL (ref 78.0–100.0)
Monocytes Absolute: 0.3 K/uL (ref 0.1–1.0)
Monocytes Relative: 2 % — ABNORMAL LOW (ref 3–12)
Neutro Abs: 16 K/uL — ABNORMAL HIGH (ref 1.7–7.7)
Neutrophils Relative %: 94 % — ABNORMAL HIGH (ref 43–77)
Platelets: 417 K/uL — ABNORMAL HIGH (ref 150–400)
RBC: 5.35 MIL/uL — ABNORMAL HIGH (ref 3.87–5.11)
RDW: 14.9 % (ref 11.5–15.5)
WBC: 17.1 K/uL — ABNORMAL HIGH (ref 4.0–10.5)

## 2013-01-16 LAB — COMPREHENSIVE METABOLIC PANEL WITH GFR
ALT: 11 U/L (ref 0–35)
AST: 15 U/L (ref 0–37)
Albumin: 3.7 g/dL (ref 3.5–5.2)
Alkaline Phosphatase: 125 U/L — ABNORMAL HIGH (ref 39–117)
BUN: 12 mg/dL (ref 6–23)
CO2: 21 meq/L (ref 19–32)
Calcium: 9.8 mg/dL (ref 8.4–10.5)
Chloride: 100 meq/L (ref 96–112)
Creatinine, Ser: 0.78 mg/dL (ref 0.50–1.10)
GFR calc Af Amer: >90 mL/min
GFR calc non Af Amer: >90 mL/min
Glucose, Bld: 151 mg/dL — ABNORMAL HIGH (ref 70–99)
Potassium: 4.3 meq/L (ref 3.5–5.1)
Sodium: 132 meq/L — ABNORMAL LOW (ref 135–145)
Total Bilirubin: 0.2 mg/dL — ABNORMAL LOW (ref 0.3–1.2)
Total Protein: 7.9 g/dL (ref 6.0–8.3)

## 2013-01-16 LAB — URINALYSIS, ROUTINE W REFLEX MICROSCOPIC
Bilirubin Urine: NEGATIVE
Glucose, UA: NEGATIVE mg/dL
Hgb urine dipstick: NEGATIVE
Ketones, ur: NEGATIVE mg/dL
Leukocytes, UA: NEGATIVE
Nitrite: NEGATIVE
Protein, ur: NEGATIVE mg/dL
Specific Gravity, Urine: 1.024 (ref 1.005–1.030)
Urobilinogen, UA: 0.2 mg/dL (ref 0.0–1.0)
pH: 6.5 (ref 5.0–8.0)

## 2013-01-16 LAB — LIPASE, BLOOD: Lipase: 14 U/L (ref 11–59)

## 2013-01-16 LAB — POCT I-STAT TROPONIN I: Troponin i, poc: 0 ng/mL (ref 0.00–0.08)

## 2013-01-16 LAB — POCT PREGNANCY, URINE: Preg Test, Ur: NEGATIVE

## 2013-01-16 MED ORDER — IOHEXOL 300 MG/ML  SOLN
100.0000 mL | Freq: Once | INTRAMUSCULAR | Status: AC | PRN
  Administered 2013-01-16: 100 mL via INTRAVENOUS

## 2013-01-16 MED ORDER — ONDANSETRON HCL 4 MG/2ML IJ SOLN
4.0000 mg | Freq: Once | INTRAMUSCULAR | Status: AC
  Administered 2013-01-16: 4 mg via INTRAVENOUS
  Filled 2013-01-16: qty 2

## 2013-01-16 MED ORDER — IOHEXOL 300 MG/ML  SOLN
50.0000 mL | Freq: Once | INTRAMUSCULAR | Status: AC | PRN
  Administered 2013-01-16: 50 mL via ORAL

## 2013-01-16 MED ORDER — ONDANSETRON HCL 4 MG/2ML IJ SOLN: 4.0000 mg | Freq: Once | INTRAMUSCULAR | Status: DC

## 2013-01-16 MED ORDER — MORPHINE SULFATE 4 MG/ML IJ SOLN
4.0000 mg | INTRAMUSCULAR | Status: DC | PRN
  Administered 2013-01-16 (×2): 4 mg via INTRAVENOUS
  Filled 2013-01-16 (×2): qty 1

## 2013-01-16 MED ORDER — PANTOPRAZOLE SODIUM 20 MG PO TBEC: 20.0000 mg | DELAYED_RELEASE_TABLET | Freq: Every day | ORAL | Status: DC

## 2013-01-16 MED ORDER — SODIUM CHLORIDE 0.9 % IV SOLN
1000.0000 mL | Freq: Once | INTRAVENOUS | Status: AC
  Administered 2013-01-16: 1000 mL via INTRAVENOUS

## 2013-01-16 MED ORDER — SODIUM CHLORIDE 0.9 % IV SOLN
1000.0000 mL | INTRAVENOUS | Status: DC
  Administered 2013-01-16: 1000 mL via INTRAVENOUS

## 2013-01-16 MED ORDER — ONDANSETRON HCL 4 MG/2ML IJ SOLN
INTRAMUSCULAR | Status: AC
  Administered 2013-01-16: 4 mg via INTRAVENOUS
  Filled 2013-01-16: qty 2

## 2013-01-16 MED ORDER — ONDANSETRON 8 MG PO TBDP: 8.0000 mg | ORAL_TABLET | Freq: Three times a day (TID) | ORAL | Status: DC | PRN

## 2013-01-16 MED ORDER — HYDROCODONE-ACETAMINOPHEN 5-325 MG PO TABS: 1.0000 | ORAL_TABLET | Freq: Four times a day (QID) | ORAL | Status: DC | PRN

## 2013-01-16 NOTE — ED Notes (Signed)
Pt ambulated to restroom. 

## 2013-01-16 NOTE — ED Provider Notes (Signed)
History     CSN: [id number]  Arrival date & time 01/16/13  1557   First MD Initiated Contact with Patient 01/16/13 1657      Chief Complaint  Patient presents with  . Abdominal Pain    epigastric    (Consider location/radiation/quality/duration/timing/severity/associated sxs/prior treatment) HPI Comments: Patient presents to urgent care describing that since she woke up she's been having upper abdominal pain ( patient points to epigastric area and right upper quadrant), patient woke up around 4 AM with severe pain and vomited a couple times, experiencing mild relief. She denies any fevers or urinary symptoms such as increased frequency pressure burning with urination. She also had about 2 loose bowel movements were there was no blood.  Last week patient had similar symptoms with abdominal pain nausea vomiting and diarrhea but they subsided and 48 hours or so.  The pain is still present in his epigastric and right upper quadrant region. It worsens with movement palpation and have not had any thing solid to eat during the course of the day.  Patient is a 32 y.o. female presenting with abdominal pain. The history is provided by the patient.  Abdominal Pain This is a recurrent problem. The current episode started 12 to 24 hours ago. The problem occurs constantly. The problem has been gradually worsening. Associated symptoms include abdominal pain and shortness of breath. Pertinent negatives include no chest pain and no headaches. The symptoms are aggravated by eating. Nothing relieves the symptoms. The treatment provided no relief.    Past Medical History  Diagnosis Date  . Obesity, unspecified   . MIGRAINE, COMMON   . OVARIAN CYST   . ALLERGIC RHINITIS   . Anemia   . ANXIETY   . DIABETES MELLITUS, TYPE II   . ADHD (attention deficit hyperactivity disorder)     Past Surgical History  Procedure Laterality Date  . No past surgeries      Family History  Problem Relation Age of  Onset  . Diabetes Mother   . Hyperlipidemia Mother   . Diabetes Father   . Hyperlipidemia Father   . Coronary artery disease Father   . Hypertension Father   . Cancer Father     esoph  . Cancer Maternal Grandfather 101    lung, met    History  Substance Use Topics  . Smoking status: Never Smoker   . Smokeless tobacco: Not on file     Comment: single, separated from spouse 02/2010.   [name] Alcohol Use: Yes    OB History   Grav Para Term Preterm Abortions TAB SAB Ect Mult Living                  Review of Systems  Constitutional: Positive for activity change and appetite change. Negative for fever, chills, diaphoresis, fatigue and unexpected weight change.  Respiratory: Positive for shortness of breath. Negative for apnea, cough, choking, chest tightness, wheezing and stridor.   Cardiovascular: Negative for chest pain.  Gastrointestinal: Positive for nausea, vomiting and abdominal pain. Negative for constipation, blood in stool and rectal pain.  Neurological: Negative for headaches.    Allergies  Chicken allergy  Home Medications   Current Outpatient Rx  Name  Route  Sig  Dispense  Refill  . ALPRAZolam (XANAX) 0.5 MG tablet   Oral   Take 1 tablet (0.5 mg total) by mouth 3 (three) times daily as needed.   30 tablet   1   . amphetamine-dextroamphetamine (ADDERALL) 20 MG tablet  Oral   Take 1 tablet (20 mg total) by mouth 2 (two) times daily.   60 tablet   0   . aspirin 81 MG tablet   Oral   Take 81 mg by mouth daily.           . Cholecalciferol (VITAMIN D) 2000 UNITS CAPS   Oral   Take by mouth daily.           . citalopram (CELEXA) 20 MG tablet   Oral   Take 1 tablet (20 mg total) by mouth daily.   90 tablet   1   . eletriptan (RELPAX) 40 MG tablet      One tablet by mouth at onset of headache. May repeat in 2 hours if headache persists or recurs. Take 1 by mouth at onset of headache. May repeat in 2 hours if headache persist or recurs   8 tablet    0   . EPINEPHrine (EPIPEN) 0.3 mg/0.3 mL DEVI   Intramuscular   Inject 0.3 mLs (0.3 mg total) into the muscle once.   1 Device   0   . ferrous sulfate (IRON SUPPLEMENT) 325 (65 FE) MG tablet   Oral   Take 325 mg by mouth daily with breakfast.           . glucose blood (TRUETEST TEST) test strip      Use as instructed   100 each   1   . lamoTRIgine (LAMICTAL) 100 MG tablet   Oral   Take 1.5 tablets (150 mg total) by mouth daily. Take 1/2 tab in the am, and 1 in the evening         . metFORMIN (GLUCOPHAGE) 500 MG tablet   Oral   Take 1 tablet (500 mg total) by mouth 2 (two) times daily with a meal.   180 tablet   1   . Norgestimate-Ethinyl Estradiol Triphasic (TRI-SPRINTEC) 0.18/0.215/0.25 MG-35 MCG tablet   Oral   Take 1 tablet by mouth daily.         . promethazine (PHENERGAN) 25 MG tablet   Oral   Take 1 tablet (25 mg total) by mouth every 6 (six) hours as needed.   30 tablet   1   . topiramate (TOPAMAX) 25 MG tablet   Oral   Take 2 tablets (50 mg total) by mouth 2 (two) times daily.   360 tablet   0   . traMADol-acetaminophen (ULTRACET) 37.5-325 MG per tablet   Oral   Take 1 tablet by mouth every 6 (six) hours as needed.   90 tablet   0   . triamcinolone (KENALOG) 0.1 % cream   Topical   Apply topically 2 (two) times daily as needed.   30 g   1   . mometasone (NASONEX) 50 MCG/ACT nasal spray   Nasal   2 sprays by Nasal route daily.             BP 112/64  Pulse 103  Temp(Src) 98.1 F (36.7 C) (Oral)  Resp 21  SpO2 100%  LMP 01/02/2013  Physical Exam  Nursing note and vitals reviewed. Constitutional: She appears distressed.  Pulmonary/Chest: Effort normal and breath sounds normal.  Abdominal: Soft. She exhibits no distension and no mass. There is hepatosplenomegaly. There is no hepatomegaly. There is tenderness in the epigastric area. There is positive Murphy's sign. There is no rigidity, no rebound, no guarding, no CVA tenderness and  no tenderness at McBurney's point.  Neurological: She is alert.  Skin: No rash noted. No erythema.    ED Course  Procedures (including critical care time)  Labs Reviewed - No data to display No results found.   1. Right upper quadrant abdominal pain   2. Epigastric abdominal pain       MDM  Symptoms and exam highly suggestive of a biliary condition. Exam most consistent with right upper quadrant pain.. Differential diagnoses include obstructive biliary process versus cholecystitis, pancreatitis etc. patient has been instructed to keep NPO status until further evaluation. Patient will be transferred to the emergency department.       Jimmie Molly, MD 01/16/13 1739

## 2013-01-16 NOTE — ED Notes (Signed)
Patient states that at approx 9 am she developed epigastric pain, n+v, has had some diarrhea

## 2013-01-16 NOTE — ED Provider Notes (Signed)
History     CSN: [id number]  Arrival date & time 01/16/13  1756   First MD Initiated Contact with Patient 01/16/13 1838      Chief Complaint  Patient presents with  . Abdominal Pain  . Vomiting    (Consider location/radiation/quality/duration/timing/severity/associated sxs/prior treatment) Patient is a 32 y.o. female presenting with abdominal pain. The history is provided by the patient.  Abdominal Pain This is a new problem. Episode onset: It started at around 4 am. It woke her up and persited throughout the day. The problem occurs constantly. The problem has been gradually worsening. Associated symptoms include abdominal pain. Pertinent negatives include no chest pain and no shortness of breath. Nothing aggravates the symptoms. Nothing relieves the symptoms. She has tried nothing for the symptoms.  She has had vomiting.  No urinary symptoms.  Pt did have some symptoms last week as well but they went away.  She went to the urgent care this evening and sent to the ED.  Past Medical History  Diagnosis Date  . Obesity, unspecified   . MIGRAINE, COMMON   . OVARIAN CYST   . ALLERGIC RHINITIS   . Anemia   . ANXIETY   . DIABETES MELLITUS, TYPE II   . ADHD (attention deficit hyperactivity disorder)     Past Surgical History  Procedure Laterality Date  . No past surgeries      Family History  Problem Relation Age of Onset  . Diabetes Mother   . Hyperlipidemia Mother   . Diabetes Father   . Hyperlipidemia Father   . Coronary artery disease Father   . Hypertension Father   . Cancer Father     esoph  . Cancer Maternal Grandfather 66    lung, met    History  Substance Use Topics  . Smoking status: Never Smoker   . Smokeless tobacco: Not on file     Comment: single, separated from spouse 02/2010.   [name] Alcohol Use: Yes    OB History   Grav Para Term Preterm Abortions TAB SAB Ect Mult Living                  Review of Systems  Respiratory: Negative for shortness of  breath.   Cardiovascular: Negative for chest pain.  Gastrointestinal: Positive for abdominal pain.  All other systems reviewed and are negative.    Allergies  Chicken allergy and Other  Home Medications   Current Outpatient Rx  Name  Route  Sig  Dispense  Refill  . ALPRAZolam (XANAX) 0.5 MG tablet   Oral   Take 0.5 mg by mouth 3 (three) times daily as needed for anxiety.         [name] amphetamine-dextroamphetamine (ADDERALL) 20 MG tablet   Oral   Take 1 tablet (20 mg total) by mouth 2 (two) times daily.   60 tablet   0   . aspirin 81 MG tablet   Oral   Take 81 mg by mouth daily.           . Cholecalciferol (VITAMIN D) 2000 UNITS CAPS   Oral   Take 2,000 Units by mouth daily.          . citalopram (CELEXA) 20 MG tablet   Oral   Take 1 tablet (20 mg total) by mouth daily.   90 tablet   1   . eletriptan (RELPAX) 40 MG tablet   Oral   One tablet by mouth at onset of headache. May repeat in  2 hours if headache persists or recurs.         [name] EPINEPHrine (EPI-PEN) 0.3 mg/0.3 mL DEVI   Intramuscular   Inject 0.3 mg into the muscle daily as needed (Anaphylaxis).         . ferrous sulfate (IRON SUPPLEMENT) 325 (65 FE) MG tablet   Oral   Take 325 mg by mouth daily with breakfast.           . glucose blood (TRUETEST TEST) test strip      Use as instructed   100 each   1   . lamoTRIgine (LAMICTAL) 100 MG tablet   Oral   Take 50-100 mg by mouth 2 (two) times daily. Take 1/2 tab in the am, and 1 in the evening         . metFORMIN (GLUCOPHAGE) 500 MG tablet   Oral   Take 1 tablet (500 mg total) by mouth 2 (two) times daily with a meal.   180 tablet   1   . Norgestimate-Ethinyl Estradiol Triphasic (TRI-SPRINTEC) 0.18/0.215/0.25 MG-35 MCG tablet   Oral   Take 1 tablet by mouth daily.         . promethazine (PHENERGAN) 25 MG tablet   Oral   Take 25 mg by mouth every 6 (six) hours as needed for nausea.         [name] topiramate (TOPAMAX) 25 MG tablet    Oral   Take 2 tablets (50 mg total) by mouth 2 (two) times daily.   360 tablet   0   . traMADol-acetaminophen (ULTRACET) 37.5-325 MG per tablet   Oral   Take 1 tablet by mouth every 6 (six) hours as needed for pain.         [name] triamcinolone (KENALOG) 0.1 % cream   Topical   Apply topically 2 (two) times daily as needed.   30 g   1   . HYDROcodone-acetaminophen (NORCO) 5-325 MG per tablet   Oral   Take 1-2 tablets by mouth every 6 (six) hours as needed for pain.   16 tablet   0   . ondansetron (ZOFRAN ODT) 8 MG disintegrating tablet   Oral   Take 1 tablet (8 mg total) by mouth every 8 (eight) hours as needed for nausea.   20 tablet   0   . pantoprazole (PROTONIX) 20 MG tablet   Oral   Take 1 tablet (20 mg total) by mouth daily.   14 tablet   0     BP 102/67  Pulse 86  Temp(Src) 98.2 F (36.8 C) (Oral)  Resp 18  SpO2 99%  LMP 01/02/2013  Physical Exam  Nursing note and vitals reviewed. Constitutional: She appears well-developed and well-nourished. No distress.  HENT:  Head: Normocephalic and atraumatic.  Right Ear: External ear normal.  Left Ear: External ear normal.  Eyes: Conjunctivae are normal. Right eye exhibits no discharge. Left eye exhibits no discharge. No scleral icterus.  Neck: Neck supple. No tracheal deviation present.  Cardiovascular: Normal rate, regular rhythm and intact distal pulses.   Pulmonary/Chest: Effort normal and breath sounds normal. No stridor. No respiratory distress. She has no wheezes. She has no rales.  Abdominal: Soft. Bowel sounds are normal. She exhibits no distension. There is tenderness in the right upper quadrant and epigastric area. There is guarding. There is no rigidity, no rebound and no CVA tenderness. No hernia.  Musculoskeletal: She exhibits no edema and no tenderness.  Neurological: She is alert. She  has normal strength. No sensory deficit. Cranial nerve deficit: no gross deficits. She exhibits normal muscle tone. She  displays no seizure activity. Coordination normal.  Skin: Skin is warm and dry. No rash noted.  Psychiatric: She has a normal mood and affect.    ED Course  Procedures (including critical care time)  Labs Reviewed  CBC WITH DIFFERENTIAL - Abnormal; Notable for the following:    WBC 17.1 (*)    RBC 5.35 (*)    Platelets 417 (*)    Neutrophils Relative % 94 (*)    Neutro Abs 16.0 (*)    Lymphocytes Relative 4 (*)    Monocytes Relative 2 (*)    All other components within normal limits  COMPREHENSIVE METABOLIC PANEL - Abnormal; Notable for the following:    Sodium 132 (*)    Glucose, Bld 151 (*)    Alkaline Phosphatase 125 (*)    Total Bilirubin 0.2 (*)    All other components within normal limits  URINALYSIS, ROUTINE W REFLEX MICROSCOPIC - Abnormal; Notable for the following:    APPearance HAZY (*)    All other components within normal limits  LIPASE, BLOOD  POCT I-STAT TROPONIN I  POCT PREGNANCY, URINE   US Abdomen Complete  01/16/2013   *RADIOLOGY REPORT*  Clinical Data:  Right upper quadrant pain.  COMPLETE ABDOMINAL ULTRASOUND  Comparison:  None.  Findings:  Gallbladder:  No gallstones, gallbladder wall thickening, or pericholecystic fluid.  Common bile duct:  3 mm, normal.  Liver:  No focal lesion identified.  Within normal limits in parenchymal echogenicity.  IVC:  Appears normal.  Pancreas:  Suboptimal visualization due to overlying bowel gas.  Spleen:  9.4 cm.  Normal echotexture.  Right Kidney:  11.3 cm. Normal echotexture.  Normal central sinus echo complex.  No calculi or hydronephrosis.  Left Kidney:  10.8 cm. Normal echotexture.  Normal central sinus echo complex.  No calculi or hydronephrosis.  Abdominal aorta:  No aneurysm identified.  IMPRESSION: Normal abdominal ultrasound.   Original Report Authenticated By: Andreas Newport, M.D.   Ct Abdomen Pelvis W Contrast  01/16/2013   *RADIOLOGY REPORT*  Clinical Data: Abdominal pain and vomiting.  CT ABDOMEN AND PELVIS WITH  CONTRAST  Technique:  Multidetector CT imaging of the abdomen and pelvis was performed following the standard protocol during bolus administration of intravenous contrast.  Contrast: OMNIPAQUE IOHEXOL 300 MG/ML SOLN  Comparison: Abdominal ultrasound earlier today.  Findings: The liver, gallbladder, pancreas, spleen, adrenal glands and kidneys are within normal limits.  The bowel loops are unremarkable and show no evidence of obstruction or inflammation. No masses, enlarged lymph nodes or hernias are identified.  No evidence of abscess or free fluid.  Left adnexal cystic area measures approximately 2.9 cm and likely represents an ovarian cyst.  No associated free fluid in the pelvis.  The bladder is unremarkable.  Bony structures are within normal limits.  IMPRESSION: No acute abnormalities.  2.9 cm left adnexal cyst.   Original Report Authenticated By: Irish Lack, M.D.     1. Abdominal pain       MDM  Pt initially had the [place] to evaluate for gallbladder disease.  The [place] was normal so the abdominal ct was performed.  No acute abnormality noted.  Will dc home on antacids and pain meds.  Recc outpatient follow up with pcp or gi.        Celene Kras, MD 01/16/13 [phone]

## 2013-01-16 NOTE — ED Notes (Signed)
Pt c/o nausea, requesting medicine.

## 2013-01-16 NOTE — ED Notes (Signed)
Pt returned from radiology.

## 2013-01-16 NOTE — ED Notes (Signed)
Pt sent here from [facility] for further eval of upper abd, epigastric, RUQ pain that started 0900. Had episode of n/v today and loose stools.

## 2013-01-16 NOTE — ED Notes (Signed)
CT aware pt is finished drinking 1st cup of oral contrast. Pt started on 2nd cup.

## 2013-01-16 NOTE — ED Notes (Signed)
Pt reports pain is still under control.

## 2013-01-16 NOTE — ED Notes (Signed)
Pt c/o RUQ and epigastric pain, pt reports she has been having n/v/d along with it. Pain hasn't eased up at all. Started to feel weak then went to urget care. Rating pain at 7/10. Pt reports pain radiates into back. Pt reports this happened last Sunday too and lasted several hours then it went away on its own. Pt in nad, skin warm and dry, resp e/u. Pt appears pale.

## 2013-01-21 ENCOUNTER — Ambulatory Visit (INDEPENDENT_AMBULATORY_CARE_PROVIDER_SITE_OTHER): Payer: BC Managed Care – PPO | Admitting: Licensed Clinical Social Worker

## 2013-01-21 DIAGNOSIS — F3189 Other bipolar disorder: Secondary | ICD-10-CM

## 2013-01-28 ENCOUNTER — Ambulatory Visit (INDEPENDENT_AMBULATORY_CARE_PROVIDER_SITE_OTHER): Payer: BC Managed Care – PPO | Admitting: Licensed Clinical Social Worker

## 2013-01-28 DIAGNOSIS — F3189 Other bipolar disorder: Secondary | ICD-10-CM

## 2013-02-07 ENCOUNTER — Ambulatory Visit: Payer: BC Managed Care – PPO | Admitting: Licensed Clinical Social Worker

## 2013-02-08 ENCOUNTER — Ambulatory Visit (INDEPENDENT_AMBULATORY_CARE_PROVIDER_SITE_OTHER): Payer: BC Managed Care – PPO | Admitting: Licensed Clinical Social Worker

## 2013-02-08 DIAGNOSIS — F3189 Other bipolar disorder: Secondary | ICD-10-CM

## 2013-02-10 ENCOUNTER — Encounter: Payer: Self-pay | Admitting: Internal Medicine

## 2013-02-10 ENCOUNTER — Telehealth: Payer: Self-pay | Admitting: Internal Medicine

## 2013-02-10 DIAGNOSIS — H4612 Retrobulbar neuritis, left eye: Secondary | ICD-10-CM

## 2013-02-10 LAB — HM DIABETES EYE EXAM

## 2013-02-10 NOTE — Telephone Encounter (Signed)
Call from Dr Digby's PA - eval pt today in their office for blurring vision x last 3-4 weeks Exam consistent with retrobulbar iritis, recommended neuro eval for same Will arrange referral now - Pt aware to expect call from our office re: the neuro appt

## 2013-02-17 ENCOUNTER — Ambulatory Visit (HOSPITAL_COMMUNITY)
Admission: RE | Admit: 2013-02-17 | Discharge: 2013-02-17 | Disposition: A | Discharge status: Home / Self Care | Payer: BC Managed Care – PPO | Source: Ambulatory Visit | Attending: Neurology | Admitting: Neurology

## 2013-02-17 ENCOUNTER — Encounter: Payer: Self-pay | Admitting: Neurology

## 2013-02-17 ENCOUNTER — Other Ambulatory Visit: Payer: Self-pay | Admitting: Neurology

## 2013-02-17 ENCOUNTER — Ambulatory Visit (INDEPENDENT_AMBULATORY_CARE_PROVIDER_SITE_OTHER): Payer: BC Managed Care – PPO | Admitting: Neurology

## 2013-02-17 VITALS — BP 110/76 | HR 120 | Temp 98.2°F | Resp 20 | Wt 196.0 lb

## 2013-02-17 DIAGNOSIS — M79609 Pain in unspecified limb: Secondary | ICD-10-CM | POA: Insufficient documentation

## 2013-02-17 DIAGNOSIS — H539 Unspecified visual disturbance: Secondary | ICD-10-CM

## 2013-02-17 DIAGNOSIS — H5713 Ocular pain, bilateral: Secondary | ICD-10-CM

## 2013-02-17 DIAGNOSIS — L723 Sebaceous cyst: Secondary | ICD-10-CM | POA: Insufficient documentation

## 2013-02-17 DIAGNOSIS — H538 Other visual disturbances: Secondary | ICD-10-CM | POA: Insufficient documentation

## 2013-02-17 DIAGNOSIS — H5710 Ocular pain, unspecified eye: Secondary | ICD-10-CM

## 2013-02-17 MED ORDER — GADOBENATE DIMEGLUMINE 529 MG/ML IV SOLN
20.0000 mL | Freq: Once | INTRAVENOUS | Status: AC
  Administered 2013-02-17: 20 mL via INTRAVENOUS

## 2013-02-17 NOTE — Patient Instructions (Addendum)
1.  Get MRI 2.  Will call with results and further instructions. 3.  Consider not taking triptans for migraine given history of stroke-like symptoms with migraine.  Your MRI is scheduled at [facility] on Friday, July 11th at 5:00 pm.  Please check in at the first floor radiology department 15 minutes prior to your scheduled appointment time. Enter the hospital off of [address] at [address].  [phone].

## 2013-02-17 NOTE — Progress Notes (Signed)
NEUROLOGY CONSULTATION NOTE  Lisa Duran MRN: [id number] DOB: 05-21-1981    Referring physician: Dr. Felicity Coyer Primary care physician: Dr. Felicity Coyer  Reason for consult:  Possible optic neuritis.  HISTORY OF PRESENT ILLNESS: Lisa Duran is a 31 y.o. female with history of diabetes, migraines and obesity, who presents for evaluation of blurred vision and questionable left retrobulbar neuritis.  She is accompanied by her sister.  She was diagnosed with NIDDM about 4 years ago.  She is evaluated by Dr. Sol Blazing, her ophthalmologist whom she sees annually for diabetic eye care.  About 4 months ago, she began experiencing blurred vision in the right eye associated with dryness and burning sensation. The following month, she saw Dr. Did be for her annual eye exam and everything seemed okay. Since that time, she began experiencing left eye pain as well.initially, she noticed it with eye movement. Now it tends to be a constant retro-orbital ache.  She will also have brief episodes of tuning of her vision. Sometimes, she will experience flashing lights. She does have a history of migraines, but this is not typical of her symptoms and these are not associated with headache. She says that she thinks the eye pain is worse with heat, such as when showering.  She also notes episodes of bowel incontinence as well.  She notes that she stumbles more while she walks. Onset of clumsiness, especially the left side, started about a year ago.  She did have a couple of falls as well. A couple weeks ago, she fell while walking down the stairs. Another time, she was standing on a chair putting up decorations for a parietal shower, when she tripped and fell off. These falls are not associated with dizziness or lightheadedness. More recently, she and her sister have noted a very fine tremor in the hands, most noticeable when she is holding a pen.  She saw Dr. Hazle Quant last week who examined her.  Records from that visit were  reviewed.   Exam revealed visual acuity 20/30 OD and 20/20 -1 OS.  Reportedly, the visual acuity was worse than previous visit a month prior.  Pupils appeared equally and normally reactive.  Fundoscopic exam seemed okay.  I'm not sure if there was evidence of color desaturation.  There was a concern for left retrobulbar neuritis, as she endorsed eye pain on movement of her left eye.  She has a history of migraines.  Her typical migraines were preceded by hypersensitivity to light and smell. She usually develops a right temporal stabbing and burning pain, 10/10, that radiates to the back of the head and down the right side of her neck. It is associated with some nausea, and occasionally vomiting, as well as photophobia and phonophobia. She usually takes the Relpax. It usually lasts one to 3 days. Frequency of the headaches are 1-2 times a month. She is on Topamax which has significantly helped reduce the frequency of her headaches.  She did have an event a couple of years ago, where she developed lightheadedness and her vision blacked out. She went to urgent care and was found to have a right facial droop. She presented to the ER where symptoms resolved. An MRI and MRA of the brain, which were personally reviewed, did not reveal any acute abnormalities such as stroke or bleed.she developed a headache and was subsequently diagnosed with a complex migraine.  Hgb A1c 6.3, BS 160.  No family history of multiple sclerosis. Her father had a massive heart attack in his  40s. Alzheimer's disease is also in her family history.  PAST MEDICAL HISTORY: Past Medical History  Diagnosis Date  . Obesity, unspecified   . MIGRAINE, COMMON   . OVARIAN CYST   . ALLERGIC RHINITIS   . Anemia   . ANXIETY   . DIABETES MELLITUS, TYPE II   . ADHD (attention deficit hyperactivity disorder)     PAST SURGICAL HISTORY: Past Surgical History  Procedure Laterality Date  . No past surgeries      MEDICATIONS: Current  Outpatient Prescriptions on File Prior to Visit  Medication Sig Dispense Refill  . ALPRAZolam (XANAX) 0.5 MG tablet Take 0.5 mg by mouth 3 (three) times daily as needed for anxiety.      [name] amphetamine-dextroamphetamine (ADDERALL) 20 MG tablet Take 1 tablet (20 mg total) by mouth 2 (two) times daily.  60 tablet  0  . aspirin 81 MG tablet Take 81 mg by mouth daily.        . Cholecalciferol (VITAMIN D) 2000 UNITS CAPS Take 2,000 Units by mouth daily.       . citalopram (CELEXA) 20 MG tablet Take 1 tablet (20 mg total) by mouth daily.  90 tablet  1  . eletriptan (RELPAX) 40 MG tablet One tablet by mouth at onset of headache. May repeat in 2 hours if headache persists or recurs.      [name] EPINEPHrine (EPI-PEN) 0.3 mg/0.3 mL DEVI Inject 0.3 mg into the muscle daily as needed (Anaphylaxis).      . ferrous sulfate (IRON SUPPLEMENT) 325 (65 FE) MG tablet Take 325 mg by mouth daily with breakfast.        . glucose blood (TRUETEST TEST) test strip Use as instructed  100 each  1  . HYDROcodone-acetaminophen (NORCO) 5-325 MG per tablet Take 1-2 tablets by mouth every 6 (six) hours as needed for pain.  16 tablet  0  . lamoTRIgine (LAMICTAL) 100 MG tablet Take 50-100 mg by mouth 2 (two) times daily. Take 1/2 tab in the am, and 1 in the evening      . metFORMIN (GLUCOPHAGE) 500 MG tablet Take 1 tablet (500 mg total) by mouth 2 (two) times daily with a meal.  180 tablet  1  . Norgestimate-Ethinyl Estradiol Triphasic (TRI-SPRINTEC) 0.18/0.215/0.25 MG-35 MCG tablet Take 1 tablet by mouth daily.      . ondansetron (ZOFRAN ODT) 8 MG disintegrating tablet Take 1 tablet (8 mg total) by mouth every 8 (eight) hours as needed for nausea.  20 tablet  0  . promethazine (PHENERGAN) 25 MG tablet Take 25 mg by mouth every 6 (six) hours as needed for nausea.      [name] topiramate (TOPAMAX) 25 MG tablet Take 2 tablets (50 mg total) by mouth 2 (two) times daily.  360 tablet  0  . traMADol-acetaminophen (ULTRACET) 37.5-325 MG per tablet  Take 1 tablet by mouth every 6 (six) hours as needed for pain.      [name] triamcinolone (KENALOG) 0.1 % cream Apply topically 2 (two) times daily as needed.  30 g  1  . pantoprazole (PROTONIX) 20 MG tablet Take 1 tablet (20 mg total) by mouth daily.  14 tablet  0   No current facility-administered medications on file prior to visit.    ALLERGIES: Allergies  Allergen Reactions  . Chicken Allergy   . Other     Any Nuts     FAMILY HISTORY: Family History  Problem Relation Age of Onset  . Diabetes Mother   .  Hyperlipidemia Mother   . Diabetes Father   . Hyperlipidemia Father   . Coronary artery disease Father   . Hypertension Father   . Cancer Father     esoph  . Cancer Maternal Grandfather 35    lung, met    SOCIAL HISTORY: History   Social History  . Marital Status: Married    Spouse Name: N/A    Number of Children: N/A  . Years of Education: N/A   Occupational History  . Not on file.   Social History Main Topics  . Smoking status: Former Smoker    Quit date: 08/20/2002  . Smokeless tobacco: Never Used     Comment: single, separated from spouse 02/2010.   [name] Alcohol Use: Yes     Comment: occasionally  . Drug Use: No  . Sexually Active: Not on file   Other Topics Concern  . Not on file   Social History Narrative   Single, separated from spouse 02/2010    REVIEW OF SYSTEMS: Constitutional: No fevers, chills, or sweats, no generalized fatigue, change in appetite Eyes: No visual changes, double vision, eye pain Ear, nose and throat: No hearing loss, ear pain, nasal congestion, sore throat Cardiovascular: No chest pain, palpitations Respiratory:  No shortness of breath at rest or with exertion, wheezes GastrointestinaI: No nausea, vomiting, diarrhea, abdominal pain, fecal incontinence Genitourinary:  No dysuria, urinary retention or frequency Musculoskeletal:  No neck pain, back pain Integumentary: No rash, pruritus, skin lesions Neurological: as  above Psychiatric: No depression, insomnia, anxiety Endocrine: No palpitations, fatigue, diaphoresis, mood swings, change in appetite, change in weight, increased thirst Hematologic/Lymphatic:  No anemia, purpura, petechiae. Allergic/Immunologic: no itchy/runny eyes, nasal congestion, recent allergic reactions, rashes  PHYSICAL EXAM: Filed Vitals:   02/17/13 1038  BP: 110/76  Pulse: 120  Temp: 98.2 F (36.8 C)  Resp: 20   General: No acute distress Head:  Normocephalic/atraumatic Neck: supple, no paraspinal tenderness, full range of motion Back: No paraspinal tenderness Heart: regular rate and rhythm Lungs: Clear to auscultation bilaterally. Vascular: No carotid bruits. Neurological Exam: Mental status: alert and oriented to person, place, time and self, speech fluent and not dysarthric, language intact. Cranial nerves: CN I: not tested CN II: pupils equal, round and reactive to light (no APD), visual fields intact, fundi unremarkable.  Visual acuity 2-25 -2 OS, 2/30 OD CN III, IV, VI:  full range of motion, no nystagmus, no ptosis CN V: endorses hyperesthesia of right V1 distribution CN VII: upper and lower face symmetric CN VIII: endorses mildly reduced hearing in right ear. CN IX, X: gag intact, uvula midline CN XI: sternocleidomastoid and trapezius muscles intact CN XII: tongue midline Bulk & Tone: normal, no fasciculations. Motor Exam: 5/5 strength, although exhibits give-way weakness in left wrist extension, left finger interossei and left knee extension.  Very fine, almost minimal, postural hand tremor bilaterally. Sensation: pinprick and vibration intact Deep Tendon Reflexes: 2+ throughout, toes down-going Finger to nose testing: normal without dysmetria Heel to shin: normal without dysmetria Gait: normal stance and stride, able to walk on heels, toes and in tandem. Romberg negative.  IMPRESSION & PLAN: Lisa Duran is a 32 y.o. female with bilateral eye pain and  right monocular blurred vision.  There was suspicion for retrobulbar optic neuritis.  She exhibits subjective findings but no objective abnormal findings on my exam.  She endorses other unusual symptoms, such as stumbling and fecal incontinence.  Further workup would be warranted. 1.  MRI of brain and  orbits w/wo gad.  We will schedule MRI ASAP, as if she does exhibit evidence of an active demyelinating process, such as optic neuritis, she will need 3 days of IV pulse steroids. 2.  If evidence of a demyelinating process, further workup will be performed, including blood work, LP, and MRI of the spine.   3.  Regardless of MRI results, would consider referral to neuro-ophthalmology.    Thank you for allowing me to take part in the care of this patient.  Shon Millet, DO  CC: Rene Paci, MD         Sol Blazing, MD

## 2013-02-18 ENCOUNTER — Telehealth: Payer: Self-pay | Admitting: Neurology

## 2013-02-18 ENCOUNTER — Ambulatory Visit (HOSPITAL_COMMUNITY): Payer: BC Managed Care – PPO

## 2013-02-18 NOTE — Telephone Encounter (Signed)
Called and spoke with Lisa Duran. Information given as per Dr. Everlena Cooper below. The patient wanted to think about the referral to a neuro-ophthalmologist at [facility] (Dr. Gentry Roch). I asked that she call me if she wanted to move forward with the referral. She states she will. No other questions ar concerns voiced at this time.

## 2013-02-18 NOTE — Telephone Encounter (Signed)
Message copied by Lisa Duran on Fri Feb 18, 2013 11:23 AM ------      Message from: Lisa Duran      Created: Fri Feb 18, 2013  7:36 AM       Please let Lisa Duran know that I reviewed the MRI and read the official report.  Her brain and optic nerves look normal.  This should be reassuring.  She has an incidental benign cyst on her scalp, which is unrelated. I don't have any neurological explanation for her symptoms and her physical exam was normal from an objective standpoint, which is good.  To be sure,  I would recommend referring to neuro-ophthalmology for their opinion regarding her vision.  Thank you.      ----- Message -----         From: Rad Results In Interface         Sent: 02/17/2013   6:05 PM           To: Lisa Servant, MD                   ------

## 2013-02-21 ENCOUNTER — Ambulatory Visit (INDEPENDENT_AMBULATORY_CARE_PROVIDER_SITE_OTHER): Payer: BC Managed Care – PPO | Admitting: Licensed Clinical Social Worker

## 2013-02-21 DIAGNOSIS — F3189 Other bipolar disorder: Secondary | ICD-10-CM

## 2013-03-02 ENCOUNTER — Ambulatory Visit (INDEPENDENT_AMBULATORY_CARE_PROVIDER_SITE_OTHER): Payer: BC Managed Care – PPO | Admitting: Licensed Clinical Social Worker

## 2013-03-02 DIAGNOSIS — F3189 Other bipolar disorder: Secondary | ICD-10-CM

## 2013-03-14 ENCOUNTER — Ambulatory Visit (INDEPENDENT_AMBULATORY_CARE_PROVIDER_SITE_OTHER): Payer: BC Managed Care – PPO | Admitting: Licensed Clinical Social Worker

## 2013-03-14 DIAGNOSIS — F3189 Other bipolar disorder: Secondary | ICD-10-CM

## 2013-03-24 ENCOUNTER — Other Ambulatory Visit: Payer: Self-pay | Admitting: *Deleted

## 2013-03-24 MED ORDER — METFORMIN HCL 500 MG PO TABS: 500.0000 mg | ORAL_TABLET | Freq: Two times a day (BID) | ORAL | Status: DC

## 2013-03-28 ENCOUNTER — Ambulatory Visit: Payer: BC Managed Care – PPO | Admitting: Licensed Clinical Social Worker

## 2013-04-12 ENCOUNTER — Encounter: Payer: Self-pay | Admitting: Internal Medicine

## 2013-04-12 MED ORDER — AMPHETAMINE-DEXTROAMPHET ER 20 MG PO CP24: 20.0000 mg | ORAL_CAPSULE | Freq: Two times a day (BID) | ORAL | Status: DC

## 2013-04-12 NOTE — Telephone Encounter (Signed)
Place rx in cabinet to be pick-up. Was already notified by md through [facility] for pick-up status.[name] KitchenRaechel Chute

## 2013-04-26 ENCOUNTER — Ambulatory Visit: Payer: BC Managed Care – PPO | Admitting: Internal Medicine

## 2013-04-26 DIAGNOSIS — Z0289 Encounter for other administrative examinations: Secondary | ICD-10-CM

## 2013-06-14 ENCOUNTER — Other Ambulatory Visit: Payer: Self-pay | Admitting: Internal Medicine

## 2013-06-16 ENCOUNTER — Other Ambulatory Visit: Payer: Self-pay

## 2013-06-21 ENCOUNTER — Encounter: Payer: Self-pay | Admitting: Internal Medicine

## 2013-07-18 ENCOUNTER — Encounter: Payer: Self-pay | Admitting: Internal Medicine

## 2013-07-18 ENCOUNTER — Ambulatory Visit (INDEPENDENT_AMBULATORY_CARE_PROVIDER_SITE_OTHER): Payer: BC Managed Care – PPO | Admitting: Internal Medicine

## 2013-07-18 ENCOUNTER — Other Ambulatory Visit (INDEPENDENT_AMBULATORY_CARE_PROVIDER_SITE_OTHER): Payer: BC Managed Care – PPO

## 2013-07-18 VITALS — HR 84 | Temp 97.6°F | Ht 64.0 in | Wt 205.0 lb

## 2013-07-18 DIAGNOSIS — E119 Type 2 diabetes mellitus without complications: Secondary | ICD-10-CM

## 2013-07-18 DIAGNOSIS — F909 Attention-deficit hyperactivity disorder, unspecified type: Secondary | ICD-10-CM

## 2013-07-18 DIAGNOSIS — Z0000 Encounter for general adult medical examination without abnormal findings: Secondary | ICD-10-CM

## 2013-07-18 LAB — HEMOGLOBIN A1C: Hgb A1c MFr Bld: 6.6 % — ABNORMAL HIGH (ref 4.6–6.5)

## 2013-07-18 MED ORDER — LAMOTRIGINE 100 MG PO TABS: 100.0000 mg | ORAL_TABLET | Freq: Two times a day (BID) | ORAL | Status: DC

## 2013-07-18 MED ORDER — AMPHETAMINE-DEXTROAMPHET ER 20 MG PO CP24: 20.0000 mg | ORAL_CAPSULE | Freq: Every day | ORAL | Status: DC

## 2013-07-18 NOTE — Assessment & Plan Note (Signed)
behav testing reviewed - ADHD confirmed on battery testing 01/13/11 -  adderall started and uptitrated to current dose - doing well Continue same - refills today 

## 2013-07-18 NOTE — Patient Instructions (Addendum)
It was good to see you today.  We have reviewed your prior records including labs and tests today  Health Maintenance reviewed - all recommended immunizations and age-appropriate screenings are up-to-date.  Test(s) ordered today. Your results will be released to MyChart (or called to you) after review, usually within 72hours after test completion. If any changes need to be made, you will be notified at that same time.  Medications reviewed and updated, no changes recommended at this time. Refill on medication(s) as discussed today.  Please schedule followup in 6 months, call sooner if problems.   Health Maintenance, Female A healthy lifestyle and preventative care can promote health and wellness.  Maintain regular health, dental, and eye exams.  Eat a healthy diet. Foods like vegetables, fruits, whole grains, low-fat dairy products, and lean protein foods contain the nutrients you need without too many calories. Decrease your intake of foods high in solid fats, added sugars, and salt. Get information about a proper diet from your caregiver, if necessary.  Regular physical exercise is one of the most important things you can do for your health. Most adults should get at least 150 minutes of moderate-intensity exercise (any activity that increases your heart rate and causes you to sweat) each week. In addition, most adults need muscle-strengthening exercises on 2 or more days a week.   Maintain a healthy weight. The body mass index (BMI) is a screening tool to identify possible weight problems. It provides an estimate of body fat based on height and weight. Your caregiver can help determine your BMI, and can help you achieve or maintain a healthy weight. For adults 20 years and older:  A BMI below 18.5 is considered underweight.  A BMI of 18.5 to 24.9 is normal.  A BMI of 25 to 29.9 is considered overweight.  A BMI of 30 and above is considered obese.  Maintain normal blood lipids and  cholesterol by exercising and minimizing your intake of saturated fat. Eat a balanced diet with plenty of fruits and vegetables. Blood tests for lipids and cholesterol should begin at age 45 and be repeated every 5 years. If your lipid or cholesterol levels are high, you are over 50, or you are a high risk for heart disease, you may need your cholesterol levels checked more frequently.Ongoing high lipid and cholesterol levels should be treated with medicines if diet and exercise are not effective.  If you smoke, find out from your caregiver how to quit. If you do not use tobacco, do not start.  Lung cancer screening is recommended for adults aged 91 80 years who are at high risk for developing lung cancer because of a history of smoking. Yearly low-dose computed tomography (CT) is recommended for people who have at least a 30-pack-year history of smoking and are a current smoker or have quit within the past 15 years. A pack year of smoking is smoking an average of 1 pack of cigarettes a day for 1 year (for example: 1 pack a day for 30 years or 2 packs a day for 15 years). Yearly screening should continue until the smoker has stopped smoking for at least 15 years. Yearly screening should also be stopped for people who develop a health problem that would prevent them from having lung cancer treatment.  If you are pregnant, do not drink alcohol. If you are breastfeeding, be very cautious about drinking alcohol. If you are not pregnant and choose to drink alcohol, do not exceed 1 drink per day.  One drink is considered to be 12 ounces (355 mL) of beer, 5 ounces (148 mL) of wine, or 1.5 ounces (44 mL) of liquor.  Avoid use of street drugs. Do not share needles with anyone. Ask for help if you need support or instructions about stopping the use of drugs.  High blood pressure causes heart disease and increases the risk of stroke. Blood pressure should be checked at least every 1 to 2 years. Ongoing high blood  pressure should be treated with medicines, if weight loss and exercise are not effective.  If you are 33 to 34 years old, ask your caregiver if you should take aspirin to prevent strokes.  Diabetes screening involves taking a blood sample to check your fasting blood sugar level. This should be done once every 3 years, after age 73, if you are within normal weight and without risk factors for diabetes. Testing should be considered at a younger age or be carried out more frequently if you are overweight and have at least 1 risk factor for diabetes.  Breast cancer screening is essential preventative care for women. You should practice "breast self-awareness." This means understanding the normal appearance and feel of your breasts and may include breast self-examination. Any changes detected, no matter how small, should be reported to a caregiver. Women in their 43s and 30s should have a clinical breast exam (CBE) by a caregiver as part of a regular health exam every 1 to 3 years. After age 24, women should have a CBE every year. Starting at age 28, women should consider having a mammogram (breast X-ray) every year. Women who have a family history of breast cancer should talk to their caregiver about genetic screening. Women at a high risk of breast cancer should talk to their caregiver about having an MRI and a mammogram every year.  Breast cancer gene (BRCA)-related cancer risk assessment is recommended for women who have family members with BRCA-related cancers. BRCA-related cancers include breast, ovarian, tubal, and peritoneal cancers. Having family members with these cancers may be associated with an increased risk for harmful changes (mutations) in the breast cancer genes BRCA1 and BRCA2. Results of the assessment will determine the need for genetic counseling and BRCA1 and BRCA2 testing.  The Pap test is a screening test for cervical cancer. Women should have a Pap test starting at age 5. Between ages  26 and 72, Pap tests should be repeated every 2 years. Beginning at age 44, you should have a Pap test every 3 years as long as the past 3 Pap tests have been normal. If you had a hysterectomy for a problem that was not cancer or a condition that could lead to cancer, then you no longer need Pap tests. If you are between ages 13 and 32, and you have had normal Pap tests going back 10 years, you no longer need Pap tests. If you have had past treatment for cervical cancer or a condition that could lead to cancer, you need Pap tests and screening for cancer for at least 20 years after your treatment. If Pap tests have been discontinued, risk factors (such as a new sexual partner) need to be reassessed to determine if screening should be resumed. Some women have medical problems that increase the chance of getting cervical cancer. In these cases, your caregiver may recommend more frequent screening and Pap tests.  The human papillomavirus (HPV) test is an additional test that may be used for cervical cancer screening. The HPV test  looks for the virus that can cause the cell changes on the cervix. The cells collected during the Pap test can be tested for HPV. The HPV test could be used to screen women aged 49 years and older, and should be used in women of any age who have unclear Pap test results. After the age of 56, women should have HPV testing at the same frequency as a Pap test.  Colorectal cancer can be detected and often prevented. Most routine colorectal cancer screening begins at the age of 33 and continues through age 52. However, your caregiver may recommend screening at an earlier age if you have risk factors for colon cancer. On a yearly basis, your caregiver may provide home test kits to check for hidden blood in the stool. Use of a small camera at the end of a tube, to directly examine the colon (sigmoidoscopy or colonoscopy), can detect the earliest forms of colorectal cancer. Talk to your caregiver  about this at age 43, when routine screening begins. Direct examination of the colon should be repeated every 5 to 10 years through age 46, unless early forms of pre-cancerous polyps or small growths are found.  Hepatitis C blood testing is recommended for all people born from [birth year] through 1967 and any individual with known risks for hepatitis C.  Practice safe sex. Use condoms and avoid high-risk sexual practices to reduce the spread of sexually transmitted infections (STIs). Sexually active women aged 13 and younger should be checked for Chlamydia, which is a common sexually transmitted infection. Older women with new or multiple partners should also be tested for Chlamydia. Testing for other STIs is recommended if you are sexually active and at increased risk.  Osteoporosis is a disease in which the bones lose minerals and strength with aging. This can result in serious bone fractures. The risk of osteoporosis can be identified using a bone density scan. Women ages 87 and over and women at risk for fractures or osteoporosis should discuss screening with their caregivers. Ask your caregiver whether you should be taking a calcium supplement or vitamin D to reduce the rate of osteoporosis.  Menopause can be associated with physical symptoms and risks. Hormone replacement therapy is available to decrease symptoms and risks. You should talk to your caregiver about whether hormone replacement therapy is right for you.  Use sunscreen. Apply sunscreen liberally and repeatedly throughout the day. You should seek shade when your shadow is shorter than you. Protect yourself by wearing long sleeves, pants, a wide-brimmed hat, and sunglasses year round, whenever you are outdoors.  Notify your caregiver of new moles or changes in moles, especially if there is a change in shape or color. Also notify your caregiver if a mole is larger than the size of a pencil eraser.  Stay current with your  immunizations. Document Released: 02/10/2011 Document Revised: 11/22/2012 Document Reviewed: 02/10/2011 [facility] Patient Information 2014 [place], [place]. Diabetes and Standards of Medical Care  Diabetes is complicated. You may find that your diabetes team includes a dietitian, nurse, diabetes educator, eye doctor, and more. To help everyone know what is going on and to help you get the care you deserve, the following schedule of care was developed to help keep you on track. Below are the tests, exams, vaccines, medicines, education, and plans you will need. HbA1c test This test shows how well you have controlled your glucose over the past 2 3 months. It is used to see if your diabetes management plan needs to  be adjusted.   It is performed at least 2 times a year if you are meeting treatment goals.  It is performed 4 times a year if therapy has changed or if you are not meeting treatment goals. Blood pressure test  This test is performed at every routine medical visit. The goal is less than 140/90 mmHg for most people, but 130/80 mmHg in some cases. Ask your health care provider about your goal. Dental exam  Follow up with the dentist regularly. Eye exam  If you are diagnosed with type 1 diabetes as a child, get an exam upon reaching the age of 10 years or older and have had diabetes for 3 5 years. Yearly eye exams are recommended after that initial eye exam.  If you are diagnosed with type 1 diabetes as an adult, get an exam within 5 years of diagnosis and then yearly.  If you are diagnosed with type 2 diabetes, get an exam as soon as possible after the diagnosis and then yearly. Foot care exam  Visual foot exams are performed at every routine medical visit. The exams check for cuts, injuries, or other problems with the feet.  A comprehensive foot exam should be done yearly. This includes visual inspection as well as assessing foot pulses and testing for loss of sensation.  Check your  feet nightly for cuts, injuries, or other problems with your feet. Tell your health care provider if anything is not healing. Kidney function test (urine microalbumin)  This test is performed once a year.  Type 1 diabetes: The first test is performed 5 years after diagnosis.  Type 2 diabetes: The first test is performed at the time of diagnosis.  A serum creatinine and estimated glomerular filtration rate (eGFR) test is done once a year to assess the level of chronic kidney disease (CKD), if present. Lipid profile (cholesterol, HDL, LDL, triglycerides)  Performed every 5 years for most people.  The goal for LDL is less than 100 mg/dL. If you are at high risk, the goal is less than 70 mg/dL.  The goal for HDL is 40 mg/dL 50 mg/dL for men and 50 mg/dL 60 mg/dL for women. An HDL cholesterol of 60 mg/dL or higher gives some protection against heart disease.  The goal for triglycerides is less than 150 mg/dL. Influenza vaccine, pneumococcal vaccine, and hepatitis B vaccine  The influenza vaccine is recommended yearly.  The pneumococcal vaccine is generally given once in a lifetime. However, there are some instances when another vaccination is recommended. Check with your health care provider.  The hepatitis B vaccine is also recommended for adults with diabetes. Diabetes self-management education  Education is recommended at diagnosis and ongoing as needed. Treatment plan  Your treatment plan is reviewed at every medical visit. Document Released: 05/25/2009 Document Revised: 03/30/2013 Document Reviewed: 12/28/2012 [facility] Patient Information 2014 [place], [place].

## 2013-07-18 NOTE — Progress Notes (Signed)
Pre-visit discussion using our clinic review tool. No additional management support is needed unless otherwise documented below in the visit note.  

## 2013-07-18 NOTE — Assessment & Plan Note (Signed)
The current medical regimen is effective;  continue present plan and medications.  Reviewed weight trends and adverse effects on metabolic syndrome -  Consult by endo early 2014, but plans to follow with PCP  Lab Results  Component Value Date   HGBA1C 6.3 01/11/2013

## 2013-07-18 NOTE — Progress Notes (Signed)
Subjective:    Patient ID: Lisa Duran, female    DOB: 07/15/1981, 32 y.o.   MRN: [id number]  HPI patient is here today for annual physical. Patient feels well overall and has no complaints.  also reviewed chronic medical issues: DM2, anxiety/bipolar 2, obesity, migraines, ADHD   Past Medical History  Diagnosis Date  . Obesity, unspecified   . MIGRAINE, COMMON   . OVARIAN CYST   . ALLERGIC RHINITIS   . Anemia   . ANXIETY   . DIABETES MELLITUS, TYPE II   . ADHD (attention deficit hyperactivity disorder)    Family History  Problem Relation Age of Onset  . Diabetes Mother   . Hyperlipidemia Mother   . Diabetes Father   . Hyperlipidemia Father   . Coronary artery disease Father   . Hypertension Father   . Cancer Father     esoph  . Cancer Maternal Grandfather 27    lung, met   History  Substance Use Topics  . Smoking status: Former Smoker    Quit date: 08/20/2002  . Smokeless tobacco: Never Used     Comment: single, separated from spouse 02/2010.   [name] Alcohol Use: Yes     Comment: occasionally    Review of Systems  Constitutional: Positive for unexpected weight change. Negative for fever, activity change and fatigue.  Respiratory: Negative for cough, shortness of breath and wheezing.   Cardiovascular: Negative for chest pain, palpitations and leg swelling.  Gastrointestinal: Negative for nausea, abdominal pain and diarrhea.  Neurological: Negative for dizziness, weakness, light-headedness and headaches.  Psychiatric/Behavioral: Negative for dysphoric mood. The patient is not nervous/anxious.   All other systems reviewed and are negative.       Objective:   Physical ExamPulse 84  Temp(Src) 97.6 F (36.4 C) (Oral)  Ht 5\' 4"  (1.626 m)  Wt 205 lb (92.987 kg)  BMI 35.17 kg/m2  SpO2 97%  LMP 04/20/2013 Wt Readings from Last 3 Encounters:  07/18/13 205 lb (92.987 kg)  02/17/13 196 lb (88.905 kg)  01/11/13 196 lb 12.8 oz (89.268 kg)   Constitutional: She is  overweight, but appears well-developed and well-nourished. No distress.  HENT: Head: Normocephalic and atraumatic. Ears: B TMs ok, no erythema or effusion; Nose: Nose normal. Mouth/Throat: Oropharynx is clear and moist. No oropharyngeal exudate.  Eyes: Conjunctivae and EOM are normal. Pupils are equal, round, and reactive to light. No scleral icterus.  Neck: Normal range of motion. Neck supple. No JVD present. No thyromegaly present.  Cardiovascular: Normal rate, regular rhythm and normal heart sounds.  No murmur heard. No BLE edema. Pulmonary/Chest: Effort normal and breath sounds normal. No respiratory distress. She has no wheezes.  Abdominal: Soft. Bowel sounds are normal. She exhibits no distension. There is no tenderness. no masses Musculoskeletal: Normal range of motion, no joint effusions. No gross deformities Neurological: She is alert and oriented to person, place, and time. No cranial nerve deficit. Coordination, balance, strength, speech and gait are normal.  Skin: Skin is warm and dry. No rash noted. No erythema.  Psychiatric: She has a normal mood and affect. Her behavior is normal. Judgment and thought content normal.    Lab Results  Component Value Date   WBC 17.1* 01/16/2013   HGB 14.9 01/16/2013   HCT 43.2 01/16/2013   PLT 417* 01/16/2013   CHOL 200 01/11/2013   TRIG 97.0 01/11/2013   HDL 84.10 01/11/2013   ALT 11 01/16/2013   AST 15 01/16/2013   NA 132* 01/16/2013  K 4.3 01/16/2013   CL 100 01/16/2013   CREATININE 0.78 01/16/2013   BUN 12 01/16/2013   CO2 21 01/16/2013   TSH 1.86 01/11/2013   HGBA1C 6.3 01/11/2013   MICROALBUR 0.8 01/11/2013       Assessment & Plan:   CPX/v70.0 - Patient has been counseled on age-appropriate routine health concerns for screening and prevention. These are reviewed and up-to-date. Immunizations are up-to-date or declined. Labs ordered and reviewed.  Also see problem list. Medications and labs reviewed today.

## 2013-08-11 ENCOUNTER — Other Ambulatory Visit: Payer: Self-pay | Admitting: Internal Medicine

## 2013-09-05 ENCOUNTER — Ambulatory Visit (INDEPENDENT_AMBULATORY_CARE_PROVIDER_SITE_OTHER)
Admission: RE | Admit: 2013-09-05 | Discharge: 2013-09-05 | Disposition: A | Discharge status: Home / Self Care | Payer: BC Managed Care – PPO | Source: Ambulatory Visit | Attending: Family Medicine | Admitting: Family Medicine

## 2013-09-05 ENCOUNTER — Encounter: Payer: Self-pay | Admitting: *Deleted

## 2013-09-05 ENCOUNTER — Ambulatory Visit (INDEPENDENT_AMBULATORY_CARE_PROVIDER_SITE_OTHER): Payer: BC Managed Care – PPO

## 2013-09-05 ENCOUNTER — Ambulatory Visit (INDEPENDENT_AMBULATORY_CARE_PROVIDER_SITE_OTHER): Payer: BC Managed Care – PPO | Admitting: Family Medicine

## 2013-09-05 ENCOUNTER — Encounter: Payer: Self-pay | Admitting: Family Medicine

## 2013-09-05 VITALS — BP 118/62 | HR 95 | Temp 99.9°F | Resp 16 | Wt 202.0 lb

## 2013-09-05 DIAGNOSIS — M79601 Pain in right arm: Secondary | ICD-10-CM

## 2013-09-05 DIAGNOSIS — M25521 Pain in right elbow: Secondary | ICD-10-CM

## 2013-09-05 DIAGNOSIS — M25539 Pain in unspecified wrist: Secondary | ICD-10-CM

## 2013-09-05 DIAGNOSIS — M25531 Pain in right wrist: Secondary | ICD-10-CM

## 2013-09-05 DIAGNOSIS — M25529 Pain in unspecified elbow: Secondary | ICD-10-CM

## 2013-09-05 DIAGNOSIS — M79609 Pain in unspecified limb: Secondary | ICD-10-CM

## 2013-09-05 MED ORDER — TRAMADOL HCL 50 MG PO TABS: 50.0000 mg | ORAL_TABLET | Freq: Three times a day (TID) | ORAL | Status: DC | PRN

## 2013-09-05 NOTE — Assessment & Plan Note (Signed)
Concern for potential radial head nondisplaced fracture. We'll make sure that there is no significant depression of the radial head with a x-ray. Patient was given a sling today and will be immobilized for the next 10 days. Patient will come back in 10 days for further evaluation. At that time as long as she is doing well we will start getting her into range of motion exercises. Prescription of tramadol given and discussed Tylenol been a augmenting medication. Icing protocol Work note Followup in 10 days.

## 2013-09-05 NOTE — Progress Notes (Signed)
CC: Right arm pain after fall  HPI: Patient is a pleasant 33 year old female who fell yesterday. Patient is complaining about continued right arm pain. Patient did fall while rollerskating attended to catch herself with her right arm while her wrist was in extension. Patient has pain mostly over the right elbow and some over the right wrist. Patient states it hurts more when she goes from pronation to supination. Patient denies any numbness, denies any weakness. Patient does have some mild discomfort at the wrist as well but she notices she can move it fully and can grab things if necessary. Did notice some bruising she states. Patient denies any shoulder, collar bone, or neck pain. Patient states that it is more of a dull chronic pain that is sharp with movement. Patient has tried ibuprofen and Tylenol with minimal benefit.   Past medical, surgical, family and social history reviewed. Medications reviewed all in the electronic medical record.   Review of Systems: No headache, visual changes, nausea, vomiting, diarrhea, constipation, dizziness, abdominal pain, skin rash, fevers, chills, night sweats, weight loss, swollen lymph nodes, body aches, joint swelling, muscle aches, chest pain, shortness of breath, mood changes.   Objective:    Blood pressure 118/62, pulse 95, temperature 99.9 F (37.7 C), temperature source Oral, resp. rate 16, weight 202 lb 0.6 oz (91.645 kg), SpO2 97.00%.   General: No apparent distress alert and oriented x3 mood and affect normal, dressed appropriately. Mild overweight HEENT: Pupils equal, extraocular movements intact Respiratory: Patient's speak in full sentences and does not appear short of breath Cardiovascular: No lower extremity edema, non tender, no erythema Skin: Warm dry intact with no signs of infection or rash on extremities or on axial skeleton. Abdomen: Soft nontender Neuro: Cranial nerves II through XII are intact, neurovascularly intact in all  extremities with 2+ DTRs and 2+ pulses. Lymph: No lymphadenopathy of posterior or anterior cervical chain or axillae bilaterally.  Gait normal with good balance and coordination.  MSK: Non tender with full range of motion and good stability and symmetric strength and tone of shoulders, hip, knee and ankles bilaterally.  Wrist: Right Inspection revealed patient does have some mild bruising on the palmar aspect over the flexor tendons. No significant swelling noted. ROM smooth and normal with good flexion and extension and ulnar/radial deviation that is symmetrical with opposite wrist. Palpation is normal over metacarpals, navicular, lunate, and TFCC; tendons without tenderness/ swelling No snuffbox tenderness. Mild pain over the triquetrum No tenderness over Canal of Guyon. Strength 5/5 in all directions without pain. Negative Finkelstein, tinel's and phalens. Negative Watson's test. Contralateral wrist unremarkable Elbow: Right Unremarkable to inspection. Patient has severe pain to palpation over the radial head Patient does have pain with pronation and supination. Does have full flexion and extension but pain with extension. Stable to varus, valgus stress. Negative moving valgus stress test. Ulnar nerve does not sublux. Negative cubital tunnel Tinel's.  Musculoskeletal ultrasound was performed and interpreted by Charlann Boxer D.O.   Elbow: Right Lateral epicondyle and common extensor tendon origin visualized.  No edema, effusions, or avulsions seen.  Radial head does have significant hypoechoic changes surrounding the area but no cortical defect noted. Medial epicondyle and common flexor tendon origin visualized.  No edema, effusions, or avulsions seen. Ulnar nerve in cubital tunnel unremarkable. Olecranon and triceps insertion visualized and unremarkable without edema, effusion, or avulsion.  No signs olecranon bursitis. Power doppler signal normal.  IMPRESSION:  Questionable radial  head injury questionable nondisplaced fracture.  Impression and Recommendations:     This case required medical decision making of moderate complexity.

## 2013-09-05 NOTE — Patient Instructions (Signed)
Very nice to meet you We will get xrays downstairs.  I will call you with the results Tramadol as needed 3 times daily.  Ice 20 minutes 2 times a day  Stay in sling all day but take off at night.

## 2013-09-09 ENCOUNTER — Telehealth: Payer: Self-pay | Admitting: Family Medicine

## 2013-09-09 NOTE — Telephone Encounter (Signed)
Patient is calling because her Tramadol and Tylenol are not working effectively for her pain. She wants to know what Dr. Tamala Julian recommends. Please advise.

## 2013-09-09 NOTE — Telephone Encounter (Signed)
Called patient back.  Told her to add anti-inflammatories to regimen.

## 2013-09-13 ENCOUNTER — Telehealth: Payer: Self-pay | Admitting: Internal Medicine

## 2013-09-14 ENCOUNTER — Ambulatory Visit (INDEPENDENT_AMBULATORY_CARE_PROVIDER_SITE_OTHER): Payer: BC Managed Care – PPO | Admitting: Family Medicine

## 2013-09-14 ENCOUNTER — Encounter: Payer: Self-pay | Admitting: Family Medicine

## 2013-09-14 VITALS — BP 120/62 | HR 95 | Temp 97.5°F | Resp 16 | Wt 202.0 lb

## 2013-09-14 DIAGNOSIS — M25521 Pain in right elbow: Secondary | ICD-10-CM

## 2013-09-14 DIAGNOSIS — M25529 Pain in unspecified elbow: Secondary | ICD-10-CM

## 2013-09-14 DIAGNOSIS — M25539 Pain in unspecified wrist: Secondary | ICD-10-CM

## 2013-09-14 DIAGNOSIS — M25531 Pain in right wrist: Secondary | ICD-10-CM | POA: Insufficient documentation

## 2013-09-14 MED ORDER — AMPHETAMINE-DEXTROAMPHET ER 20 MG PO CP24: 20.0000 mg | ORAL_CAPSULE | Freq: Every day | ORAL | Status: DC

## 2013-09-14 NOTE — Patient Instructions (Signed)
Very nice to see you again Wear brace at all times if you can.  Blow dry if you get it wet.  With your elbow can move but be careful no lifting greater than 10 pounds Come back in 2 weeks.  Come in 30 minutes early and get xrays.

## 2013-09-14 NOTE — Telephone Encounter (Signed)
Pt called to follow up on the Adderall request, pt is out of this med. Please advise.

## 2013-09-14 NOTE — Assessment & Plan Note (Signed)
I would have patient start doing some range of motion exercises and coming out of the sling as tolerated. Patient does if she has any worsening pain to go back into the sling. I would like to see her back again in 2 weeks and at that time we will get repeat x-rays. As long as we do not see any migration of the fracture and patient will be able to start doing more strengthening exercises.

## 2013-09-14 NOTE — Progress Notes (Signed)
Pre-visit discussion using our clinic review tool. No additional management support is needed unless otherwise documented below in the visit note.  

## 2013-09-14 NOTE — Telephone Encounter (Signed)
Called pt no answer LMOM rx ready for pick-up.../lmb 

## 2013-09-14 NOTE — Assessment & Plan Note (Signed)
The patient's tenderness only been a week out from injury I am concerned the patient may have a scaphoid fracture. We will not do repeat imaging until followup. Patient was put in a brace today though. This was fitted by me. Patient will come back in 2 weeks he'll have x-rays and we'll discuss further evaluation and treatment based on those findings.

## 2013-09-14 NOTE — Telephone Encounter (Signed)
Printed and signed.  

## 2013-09-14 NOTE — Progress Notes (Signed)
  CC: Radial head fracture followup  HPI: Patient is here for followup of the radial head. Patient did have a nondisplaced radial head fracture. Patient states that the pain is improving but still hurts significantly with pronation or supination. Patient has been wearing a sling. Patient is also stating that she is having significant more pain with the wrist. Patient points to unfortunately over the scaphoid bone. Denies any numbness but states that it can hurt with extension.   Past medical, surgical, family and social history reviewed. Medications reviewed all in the electronic medical record.   Review of Systems: No headache, visual changes, nausea, vomiting, diarrhea, constipation, dizziness, abdominal pain, skin rash, fevers, chills, night sweats, weight loss, swollen lymph nodes, body aches, joint swelling, muscle aches, chest pain, shortness of breath, mood changes.   Objective:    Blood pressure 120/62, pulse 95, temperature 97.5 F (36.4 C), temperature source Oral, resp. rate 16, weight 202 lb (91.627 kg), last menstrual period 08/18/2013, SpO2 94.00%.   General: No apparent distress alert and oriented x3 mood and affect normal, dressed appropriately. Mild overweight HEENT: Pupils equal, extraocular movements intact Respiratory: Patient's speak in full sentences and does not appear short of breath Cardiovascular: No lower extremity edema, non tender, no erythema Skin: Warm dry intact with no signs of infection or rash on extremities or on axial skeleton. Abdomen: Soft nontender Neuro: Cranial nerves II through XII are intact, neurovascularly intact in all extremities with 2+ DTRs and 2+ pulses. Lymph: No lymphadenopathy of posterior or anterior cervical chain or axillae bilaterally.  Gait normal with good balance and coordination.  MSK: Non tender with full range of motion and good stability and symmetric strength and tone of shoulders, hip, knee and ankles bilaterally.  Wrist:  Right Inspection revealed patient does have some mild bruising on the palmar aspect over the flexor tendons. No significant swelling noted. ROM smooth and normal with good flexion and extension and ulnar/radial deviation that is symmetrical with opposite wrist. Palpation is normal over metacarpals, navicular, lunate, and TFCC; tendons without tenderness/ swelling Positive pain over the scaphoid bone and snuff box. No tenderness over Canal of [place]. Strength 5/5 in all directions without pain. Negative Finkelstein, tinel's and phalens. Negative Watson's test. Contralateral wrist unremarkable Elbow: Right Unremarkable to inspection. Patient has severe pain to palpation over the radial head Patient does have pain with pronation and supination. Does have full flexion and extension but pain with extension. Stable to varus, valgus stress. Negative moving valgus stress test. Ulnar nerve does not sublux. Negative cubital tunnel Tinel's.     Impression and Recommendations:     This case required medical decision making of moderate complexity.

## 2013-09-28 ENCOUNTER — Ambulatory Visit: Payer: BC Managed Care – PPO | Admitting: Family Medicine

## 2013-09-30 ENCOUNTER — Ambulatory Visit (INDEPENDENT_AMBULATORY_CARE_PROVIDER_SITE_OTHER)
Admission: RE | Admit: 2013-09-30 | Discharge: 2013-09-30 | Disposition: A | Discharge status: Home / Self Care | Payer: BC Managed Care – PPO | Source: Ambulatory Visit | Attending: Family Medicine | Admitting: Family Medicine

## 2013-09-30 ENCOUNTER — Ambulatory Visit (INDEPENDENT_AMBULATORY_CARE_PROVIDER_SITE_OTHER): Payer: BC Managed Care – PPO | Admitting: Family Medicine

## 2013-09-30 ENCOUNTER — Encounter: Payer: Self-pay | Admitting: Family Medicine

## 2013-09-30 ENCOUNTER — Other Ambulatory Visit (INDEPENDENT_AMBULATORY_CARE_PROVIDER_SITE_OTHER): Payer: BC Managed Care – PPO

## 2013-09-30 VITALS — BP 112/70 | HR 100 | Temp 98.2°F | Resp 16 | Wt 204.8 lb

## 2013-09-30 DIAGNOSIS — M25531 Pain in right wrist: Secondary | ICD-10-CM

## 2013-09-30 DIAGNOSIS — M25521 Pain in right elbow: Secondary | ICD-10-CM

## 2013-09-30 DIAGNOSIS — M25529 Pain in unspecified elbow: Secondary | ICD-10-CM

## 2013-09-30 DIAGNOSIS — M25539 Pain in unspecified wrist: Secondary | ICD-10-CM

## 2013-09-30 NOTE — Assessment & Plan Note (Addendum)
No sign of scaphoid injury at this time.  Home exercise program given and discussed icing protocol. Patient will come out of the brace when at home. Patient will come back and see me again in 3 weeks.

## 2013-09-30 NOTE — Patient Instructions (Addendum)
It is good to see you Please be careful on the ice! Continue the exercises and can do more activity as you feel fit.  Try extending it while sitting all the time.  Would consider ice 20 minutes still at the end of the day.  Try exercises for wrist as well.  If you are not perfect in 3-4 weeks I would like to see you again.

## 2013-09-30 NOTE — Progress Notes (Signed)
  CC: Radial head fracture followup  HPI: Patient is here for followup of the radial head. Patient did have a nondisplaced radial head fracture. Patient was out of the sling and started doing home exercises after last visit. Patient was also having right wrist pain mostly over the anatomical snuffbox Jimi concern for potential scaphoid fracture. Patient was put in a brace and was to wear that day and night. Patient states   Past medical, surgical, family and social history reviewed. Medications reviewed all in the electronic medical record.   Review of Systems: No headache, visual changes, nausea, vomiting, diarrhea, constipation, dizziness, abdominal pain, skin rash, fevers, chills, night sweats, weight loss, swollen lymph nodes, body aches, joint swelling, muscle aches, chest pain, shortness of breath, mood changes.   Objective:    Last menstrual period 08/18/2013.   General: No apparent distress alert and oriented x3 mood and affect normal, dressed appropriately. Mild overweight HEENT: Pupils equal, extraocular movements intact Respiratory: Patient's speak in full sentences and does not appear short of breath Cardiovascular: No lower extremity edema, non tender, no erythema Skin: Warm dry intact with no signs of infection or rash on extremities or on axial skeleton. Abdomen: Soft nontender Neuro: Cranial nerves II through XII are intact, neurovascularly intact in all extremities with 2+ DTRs and 2+ pulses. Lymph: No lymphadenopathy of posterior or anterior cervical chain or axillae bilaterally.  Gait normal with good balance and coordination.  MSK: Non tender with full range of motion and good stability and symmetric strength and tone of shoulders, hip, knee and ankles bilaterally.  Wrist: Right Inspection revealed patient does have some mild bruising on the palmar aspect over the flexor tendons. No significant swelling noted. ROM smooth and normal with good flexion and extension and  ulnar/radial deviation that is symmetrical with opposite wrist. Palpation is normal over metacarpals, navicular, lunate, and TFCC; tendons without tenderness/ swelling Positive pain over the scaphoid bone and snuff box. No tenderness over Canal of [place]. Strength 5/5 in all directions without pain. Negative Finkelstein, tinel's and phalens. Negative Watson's test. Contralateral wrist unremarkable Elbow: Right Unremarkable to inspection. Patient moderate pain to palpation over the radial head Patient does have pain with pronation and supination. Does have full flexion and extension but pain with extension still occurs. Stable to varus, valgus stress. Negative moving valgus stress test. Ulnar nerve does not sublux. Negative cubital tunnel Tinel's.   X-rays were ordered reviewed and interpreted by me today. X-rays the patient's elbow shows that patient still has a nondisplaced radial head fracture but no significant depression noted. Appears to be healing fine. Complete right wrist x-ray also shows no fracture of the scaphoid bone.  Limited muscular skeletal ultrasound was performed and interpreted by me today. Limited ultrasound of the radial head does show that patient has good callus formation and no significant displacement as seen with the x-ray. Patient does still have some overlying hypoechoic area anteriorly to this.  Impression: Healing mildly displaced radial head fracture.   Impression and Recommendations:     This case required medical decision making of moderate complexity.

## 2013-09-30 NOTE — Progress Notes (Signed)
Pre visit review using our clinic review tool, if applicable. No additional management support is needed unless otherwise documented below in the visit note. 

## 2013-09-30 NOTE — Assessment & Plan Note (Signed)
Nondisplaced radial head fracture with no movement. Spent greater than 25 minutes with patient face-to-face and had greater than 50% of counseling including as described above in assessment and plan. Patient at this time can start doing more activity including strengthening.

## 2013-10-21 ENCOUNTER — Ambulatory Visit: Payer: BC Managed Care – PPO | Admitting: Internal Medicine

## 2013-10-21 ENCOUNTER — Other Ambulatory Visit: Payer: Self-pay | Admitting: Internal Medicine

## 2013-11-01 ENCOUNTER — Other Ambulatory Visit: Payer: Self-pay | Admitting: *Deleted

## 2013-11-01 NOTE — Telephone Encounter (Signed)
Received PA form on relpax. Form completed and fax back to insurance...Johny Chess

## 2013-11-07 ENCOUNTER — Ambulatory Visit (INDEPENDENT_AMBULATORY_CARE_PROVIDER_SITE_OTHER): Payer: BC Managed Care – PPO | Admitting: Family Medicine

## 2013-11-07 ENCOUNTER — Encounter: Payer: Self-pay | Admitting: Family Medicine

## 2013-11-07 ENCOUNTER — Other Ambulatory Visit (INDEPENDENT_AMBULATORY_CARE_PROVIDER_SITE_OTHER): Payer: BC Managed Care – PPO

## 2013-11-07 ENCOUNTER — Encounter: Payer: Self-pay | Admitting: Internal Medicine

## 2013-11-07 VITALS — BP 114/82 | HR 91

## 2013-11-07 DIAGNOSIS — M25521 Pain in right elbow: Secondary | ICD-10-CM

## 2013-11-07 DIAGNOSIS — S52123A Displaced fracture of head of unspecified radius, initial encounter for closed fracture: Secondary | ICD-10-CM

## 2013-11-07 DIAGNOSIS — M25529 Pain in unspecified elbow: Secondary | ICD-10-CM

## 2013-11-07 MED ORDER — MELOXICAM 15 MG PO TABS: 15.0000 mg | ORAL_TABLET | Freq: Every day | ORAL | Status: DC

## 2013-11-07 NOTE — Assessment & Plan Note (Signed)
Becoming somewhat concerned the patient's radial head fracture is in the healing completely. There was very minimal displacement that was seen previously and I do not think there is any worsening of the last x-ray. Today's ultrasound does not show any recurrent fluid accumulation that could be occurring. I think we're just lacking the range of motion. Patient is encouraged to go to formal physical therapy which I think would be beneficial. If this does not seem to help in 2-3 weeks patient will get back to me and we'll consider ordering a CT scan to evaluate the radial head to make sure that there is no significant depression that we are missing on the x-ray or the ultrasound that could change medical management. Otherwise patient will continue with home exercises and will followup with me in 4-6 weeks.

## 2013-11-07 NOTE — Telephone Encounter (Signed)
Received PA back it states no prior authorization required. Plan allows up to 18 tablets per 30 days...Lisa Duran

## 2013-11-07 NOTE — Patient Instructions (Signed)
Good to see you Start physical therapy for your elbow. They will call you For your shoulder try exercises 3 times a week Meloxicam daily for 10 days ICe 20 minutes 2 times a day Email me in 2-3 weeks after start physical therapy and if not where you want to be we will do CT scan of elbow and if shoulder still hurts come back and let me stick a needle in it!

## 2013-11-07 NOTE — Progress Notes (Signed)
  CC: Radial head fracture followup  HPI: Patient is here for followup of her radial head fracture. Patient has been doing very well but continues to have pain at tried to lift anything heavy in his arm he still does not have full extension. Patient has been doing the exercises at least 3-4 times a week but has not noticed any significant improvement since last visit. Patient is not taking any pain medications on a regular basis and is sleeping comfortably. Patient has noticed though that she is starting to use her shoulder a more frequent basis and started having more discomfort up bear because she is compensated. Denies any new symptoms.   Past medical, surgical, family and social history reviewed. Medications reviewed all in the electronic medical record.   Review of Systems: No headache, visual changes, nausea, vomiting, diarrhea, constipation, dizziness, abdominal pain, skin rash, fevers, chills, night sweats, weight loss, swollen lymph nodes, body aches, joint swelling, muscle aches, chest pain, shortness of breath, mood changes.   Objective:    Blood pressure 114/82, pulse 91, SpO2 97.00%.   General: No apparent distress alert and oriented x3 mood and affect normal, dressed appropriately. Mild overweight HEENT: Pupils equal, extraocular movements intact Respiratory: Patient's speak in full sentences and does not appear short of breath Cardiovascular: No lower extremity edema, non tender, no erythema Skin: Warm dry intact with no signs of infection or rash on extremities or on axial skeleton. Abdomen: Soft nontender Neuro: Cranial nerves II through XII are intact, neurovascularly intact in all extremities with 2+ DTRs and 2+ pulses. Lymph: No lymphadenopathy of posterior or anterior cervical chain or axillae bilaterally.  Gait normal with good balance and coordination.  MSK: Non tender with full range of motion and good stability and symmetric strength and tone of shoulders, wrists, hip,  knee and ankles bilaterally.   Elbow: Right Unremarkable to inspection. Patient moderate pain to palpation over the radial head Patient does have pain with pronation and supination. Does have full flexion and extension but pain with extension still occurs. Stable to varus, valgus stress. Negative moving valgus stress test. Ulnar nerve does not sublux. Negative cubital tunnel Tinel's.   Limited muscular skeletal ultrasound was performed and interpreted by me today. Limited ultrasound of the radial head does show even patient's callus formation that was previously seen has completely re\re observed at this time. Patient does have what appears to be a small cyst formation around the area but no acute findings.  Impression: Healed radial head fracture..   Impression and Recommendations:     This case required medical decision making of moderate complexity.

## 2013-11-08 MED ORDER — ALPRAZOLAM 0.5 MG PO TABS: 0.5000 mg | ORAL_TABLET | Freq: Three times a day (TID) | ORAL | Status: DC | PRN

## 2013-11-16 ENCOUNTER — Ambulatory Visit: Payer: BC Managed Care – PPO | Attending: Internal Medicine | Admitting: Physical Therapy

## 2013-11-16 DIAGNOSIS — IMO0001 Reserved for inherently not codable concepts without codable children: Secondary | ICD-10-CM | POA: Insufficient documentation

## 2013-11-16 DIAGNOSIS — M25539 Pain in unspecified wrist: Secondary | ICD-10-CM | POA: Insufficient documentation

## 2013-11-30 ENCOUNTER — Encounter: Payer: Self-pay | Admitting: Internal Medicine

## 2013-11-30 MED ORDER — AMPHETAMINE-DEXTROAMPHET ER 20 MG PO CP24: 20.0000 mg | ORAL_CAPSULE | Freq: Every day | ORAL | Status: DC

## 2013-12-14 ENCOUNTER — Encounter: Payer: Self-pay | Admitting: Internal Medicine

## 2013-12-15 ENCOUNTER — Other Ambulatory Visit (INDEPENDENT_AMBULATORY_CARE_PROVIDER_SITE_OTHER): Payer: BC Managed Care – PPO

## 2013-12-15 ENCOUNTER — Encounter: Payer: Self-pay | Admitting: Internal Medicine

## 2013-12-15 ENCOUNTER — Ambulatory Visit (INDEPENDENT_AMBULATORY_CARE_PROVIDER_SITE_OTHER): Payer: BC Managed Care – PPO | Admitting: Internal Medicine

## 2013-12-15 VITALS — BP 124/82 | HR 91 | Temp 98.3°F | Ht 64.0 in | Wt 208.0 lb

## 2013-12-15 DIAGNOSIS — E119 Type 2 diabetes mellitus without complications: Secondary | ICD-10-CM

## 2013-12-15 DIAGNOSIS — Z91018 Allergy to other foods: Secondary | ICD-10-CM

## 2013-12-15 LAB — HEMOGLOBIN A1C: Hgb A1c MFr Bld: 7.4 % — ABNORMAL HIGH (ref 4.6–6.5)

## 2013-12-15 LAB — CBC WITH DIFFERENTIAL/PLATELET
Basophils Absolute: 0.1 10*3/uL (ref 0.0–0.1)
Basophils Relative: 0.6 % (ref 0.0–3.0)
Eosinophils Absolute: 0.3 10*3/uL (ref 0.0–0.7)
Eosinophils Relative: 3.3 % (ref 0.0–5.0)
HEMATOCRIT: 38.2 % (ref 36.0–46.0)
HEMOGLOBIN: 12.4 g/dL (ref 12.0–15.0)
LYMPHS PCT: 24.2 % (ref 12.0–46.0)
Lymphs Abs: 2.4 10*3/uL (ref 0.7–4.0)
MCHC: 32.5 g/dL (ref 30.0–36.0)
MCV: 79.4 fl (ref 78.0–100.0)
MONOS PCT: 7 % (ref 3.0–12.0)
Monocytes Absolute: 0.7 10*3/uL (ref 0.1–1.0)
NEUTROS ABS: 6.6 10*3/uL (ref 1.4–7.7)
Neutrophils Relative %: 64.9 % (ref 43.0–77.0)
Platelets: 504 10*3/uL — ABNORMAL HIGH (ref 150.0–400.0)
RBC: 4.81 Mil/uL (ref 3.87–5.11)
RDW: 15.9 % — ABNORMAL HIGH (ref 11.5–15.5)
WBC: 10.1 10*3/uL (ref 4.0–10.5)

## 2013-12-15 NOTE — Assessment & Plan Note (Signed)
Prior allergy eval for same - serum and skin testing w/ [facility] Increasing severity of response ?more than "chicken" Recheck panel add daily antihistamine - claritin Reminded to use Epipen prn follow up allergist

## 2013-12-15 NOTE — Patient Instructions (Signed)
It was good to see you today.  We have reviewed your prior records including labs and tests today  Test(s) ordered today. Your results will be released to [facility] (or called to you) after review, usually within 72hours after test completion. If any changes need to be made, you will be notified at that same time.  Medications reviewed and updated, start claritin everyday for next 30 days- no other changes recommended at this time.  Use EpiPen if needed!!!  Please schedule followup in 6 months for annual exam/labs, call sooner if problems.

## 2013-12-15 NOTE — Progress Notes (Addendum)
Violette Morneault [id number] 12/15/2013  Chief Complaint  Patient presents with  . Allergic Reaction    food allg?    Subjective  HPI  Pt here for food allergies (chicken).  Had allergic rxn to salad w/tuna at [employer] 2 weeks ago & deli hotdog w/ all types of meat 2 days ago.  Currently has some seasonal allergies. Here because allergic reactions have been worse.   Episode 2 weeks ago: Thinks tuna was cross contaminated with chicken during preparation.She tried benadryl for her sxs and they resolved. Carries epi pen, but did not use.   Pertinent positives: throat rash, burning ears, coughing, vomiting, chest tightness that caused CP, shallow breathing.  Episode 2 days ago: Ate hotdog- thought it was a beef hot dog and then realized it was a deli hotdog that contains all types of meat. Had another rxn that tirggered a migraine. Took replax for migraine and it improved sxs, but it took over 24 hours to resolve migraine and chest tightness. Carries epi pen, but did not use.   Pertinent positives:  itchy ears, itchy/erythematous neck, angioedema. Took benadryl again for sxs and it was ineffective.    Pertinent negatives: hives, throat closing up, diarrhea, itchy/watery eyes.  Past Medical History  Diagnosis Date  . Obesity, unspecified   . MIGRAINE, COMMON   . OVARIAN CYST   . ALLERGIC RHINITIS   . Anemia   . ANXIETY   . DIABETES MELLITUS, TYPE II   . ADHD (attention deficit hyperactivity disorder)     Past Surgical History  Procedure Laterality Date  . No past surgeries      Family History  Problem Relation Age of Onset  . Diabetes Mother   . Hyperlipidemia Mother   . Diabetes Father   . Hyperlipidemia Father   . Coronary artery disease Father   . Hypertension Father   . Cancer Father     esoph  . Cancer Maternal Grandfather 84    lung, met    History  Substance Use Topics  . Smoking status: Former Smoker    Quit date: 08/20/2002  . Smokeless tobacco:  Never Used     Comment: single, separated from spouse 02/2010.   [name] Alcohol Use: Yes     Comment: occasionally    Current Outpatient Prescriptions on File Prior to Visit  Medication Sig Dispense Refill  . ALPRAZolam (XANAX) 0.5 MG tablet Take 1 tablet (0.5 mg total) by mouth 3 (three) times daily as needed for anxiety.  30 tablet  1  . amphetamine-dextroamphetamine (ADDERALL XR) 20 MG 24 hr capsule Take 1 capsule (20 mg total) by mouth daily.  30 capsule  0  . aspirin 81 MG tablet Take 81 mg by mouth daily.        . Cholecalciferol (VITAMIN D) 2000 UNITS CAPS Take 2,000 Units by mouth daily.       . citalopram (CELEXA) 20 MG tablet TAKE 1 TABLET DAILY  90 tablet  3  . EPINEPHrine (EPI-PEN) 0.3 mg/0.3 mL DEVI Inject 0.3 mg into the muscle daily as needed (Anaphylaxis).      . ferrous sulfate (IRON SUPPLEMENT) 325 (65 FE) MG tablet Take 325 mg by mouth daily with breakfast.        . glucose blood (TRUETEST TEST) test strip Use as instructed  100 each  1  . lamoTRIgine (LAMICTAL) 100 MG tablet Take 1 tablet (100 mg total) by mouth 2 (two) times daily.  180 tablet  1  .  metFORMIN (GLUCOPHAGE) 500 MG tablet Take 1 tablet (500 mg total) by mouth 2 (two) times daily with a meal.  180 tablet  3  . promethazine (PHENERGAN) 25 MG tablet Take 25 mg by mouth every 6 (six) hours as needed for nausea.      . RELPAX 40 MG tablet TAKE 1 TABLET AT ONSET OF HEADACHE, MAY REPEAT IN 2 HOURS IF HEADACHE PERSISTS OR RECURS.  8 tablet  1  . topiramate (TOPAMAX) 25 MG tablet TAKE 2 TABLETS TWICE A DAY  360 tablet  0  . traMADol-acetaminophen (ULTRACET) 37.5-325 MG per tablet Take 1 tablet by mouth every 6 (six) hours as needed for pain.      [name] triamcinolone (KENALOG) 0.1 % cream Apply topically 2 (two) times daily as needed.  30 g  1  . HYDROcodone-acetaminophen (NORCO) 5-325 MG per tablet Take 1-2 tablets by mouth every 6 (six) hours as needed for pain.  16 tablet  0  . meloxicam (MOBIC) 15 MG tablet Take 1 tablet  (15 mg total) by mouth daily.  30 tablet  0  . Norgestimate-Ethinyl Estradiol Triphasic (TRI-SPRINTEC) 0.18/0.215/0.25 MG-35 MCG tablet Take 1 tablet by mouth daily.      . ondansetron (ZOFRAN ODT) 8 MG disintegrating tablet Take 1 tablet (8 mg total) by mouth every 8 (eight) hours as needed for nausea.  20 tablet  0  . pantoprazole (PROTONIX) 20 MG tablet Take 1 tablet (20 mg total) by mouth daily.  14 tablet  0  . traMADol (ULTRAM) 50 MG tablet Take 1 tablet (50 mg total) by mouth every 8 (eight) hours as needed.  30 tablet  0   No current facility-administered medications on file prior to visit.     Allergies: Allergies  Allergen Reactions  . Chicken Allergy   . Other     Any Nuts     Review of Systems  Constitutional: Negative for fever, chills and malaise/fatigue.  HENT: Negative for congestion, ear pain, hearing loss and sore throat.   Eyes: Negative for blurred vision, photophobia, pain and redness.  Respiratory: Positive for cough and shortness of breath. Negative for hemoptysis, sputum production, wheezing and stridor.   Cardiovascular: Positive for chest pain. Negative for palpitations, orthopnea, claudication and leg swelling.       Chest tightness w/ subsequent chest pain.  Gastrointestinal: Positive for vomiting. Negative for nausea, abdominal pain, diarrhea and constipation.  Genitourinary: Negative for dysuria, urgency and frequency.  Musculoskeletal: Negative for joint pain, myalgias and neck pain.  Skin: Positive for itching and rash.       Rash  on neck with itchy ears & throat.  Neurological: Negative for dizziness, tingling, tremors, speech change, weakness and headaches.       Objective  Filed Vitals:   12/15/13 1056  BP: 124/82  Pulse: 91  Temp: 98.3 F (36.8 C)  TempSrc: Oral  Height: 5' 4" (1.626 m)  Weight: 208 lb (94.348 kg)  SpO2: 97%    Physical Exam  Constitutional: She is oriented to person, place, and time. She appears well-developed and  well-nourished. No distress.  HENT:  Mouth/Throat: No oropharyngeal exudate.  R and L ear canals showed erythema. Cobblestone appearance in throat. Erythematous nasal turbinates BL.   Eyes: Conjunctivae are normal. Pupils are equal, round, and reactive to light. Right eye exhibits no discharge. Left eye exhibits no discharge. No scleral icterus.  Neck: Neck supple. No thyromegaly present.  No evidence of rash. Eyrthema on neck from sunburn.  Cardiovascular: Normal rate and regular rhythm.  Exam reveals no gallop and no friction rub.   No murmur heard. Respiratory: Effort normal and breath sounds normal. No respiratory distress. She has no wheezes. She has no rales.  Neurological: She is alert and oriented to person, place, and time.  Skin: No rash noted. No erythema.  Psychiatric: She has a normal mood and affect. Her behavior is normal. Judgment and thought content normal.    BP Readings from Last 3 Encounters:  12/15/13 124/82  11/07/13 114/82  09/30/13 112/70    Wt Readings from Last 3 Encounters:  12/15/13 208 lb (94.348 kg)  09/30/13 204 lb 12.8 oz (92.897 kg)  09/14/13 202 lb (91.627 kg)    Lab Results  Component Value Date   WBC 17.1* 01/16/2013   HGB 14.9 01/16/2013   HCT 43.2 01/16/2013   PLT 417* 01/16/2013   GLUCOSE 151* 01/16/2013   CHOL 200 01/11/2013   TRIG 97.0 01/11/2013   HDL 84.10 01/11/2013   LDLCALC 97 01/11/2013   ALT 11 01/16/2013   AST 15 01/16/2013   NA 132* 01/16/2013   K 4.3 01/16/2013   CL 100 01/16/2013   CREATININE 0.78 01/16/2013   BUN 12 01/16/2013   CO2 21 01/16/2013   TSH 1.86 01/11/2013   HGBA1C 6.6* 07/18/2013   MICROALBUR 0.8 01/11/2013    Dg Elbow Complete Right  09/30/2013   CLINICAL DATA:  Radial head fracture  EXAM: RIGHT ELBOW - COMPLETE 3+ VIEW  COMPARISON:  09/05/2013  FINDINGS: The radial head fracture is again identified. There is slight increase in the degree of displacement of the fracture fragment. The joint effusion remains anteriorly. No other focal  abnormality is seen.  IMPRESSION: Slight increase in the degree of displacement of the radial head fracture. Persistent effusion is noted.   Electronically Signed   By: Inez Catalina M.D.   On: 09/30/2013 09:32   Dg Wrist Complete Right  09/30/2013   CLINICAL DATA:  History of scaphoid fracture, followup  EXAM: RIGHT WRIST - COMPLETE 3+ VIEW  COMPARISON:  Right wrist films of 09/05/2013  FINDINGS: No acute fracture is seen. The radiocarpal joint space appears normal and the ulnar styloid is intact. The carpal bones are in normal position.  IMPRESSION: Negative.   Electronically Signed   By: Ivar Drape M.D.   On: 09/30/2013 09:31   [place] Extrem Up Right Ltd  09/30/2013   Limited muscular skeletal ultrasound was performed and interpreted by me  today. Limited ultrasound of the radial head does show that patient has  good callus formation and no significant displacement as seen with the  x-ray. Patient does still have some overlying hypoechoic area anteriorly  to this.  Impression: Healing mildly displaced radial head fracture.       Assessment and Plan  Food allergy (chicken) allergic rxns: F/u with allergist to see if allergy injections are necessary for desensitization at this time. Ordered allergen food profile specific IgE and CBC w/ differential to r/o other food allergens.. Told patient to continue carrying epi pen w/ benadryl in her purse and use PRN. Given that she currently has seasonal allergies, told patient to take OTC allergy medication (claritin, alegra, or zyrtec) to decrease histamine response during next allergic rxn.   Diabetes Mellitus: Last A1C (07/18/13)-6.6 Ordered A1C since 6 mo f/u. Stable on current regimen.    Return in about 6 months (around 06/17/2014) for annual exam and labs, recheck diabetes and labs. Berenice Bouton,  Student-PA

## 2013-12-15 NOTE — Progress Notes (Signed)
Pre visit review using our clinic review tool, if applicable. No additional management support is needed unless otherwise documented below in the visit note. 

## 2013-12-15 NOTE — Assessment & Plan Note (Signed)
The current medical regimen is effective;  continue present plan and medications.  Reviewed weight trends and adverse effects on metabolic syndrome -  Consult by endo early 2014, but plans to follow with PCP  Lab Results  Component Value Date   HGBA1C 6.6* 07/18/2013

## 2013-12-15 NOTE — Progress Notes (Signed)
Subjective:    Patient ID: Lisa Duran, female    DOB: 1981/04/24, 33 y.o.   MRN: [id number]  Allergic Reaction This is a recurrent problem. The current episode started more than 1 week ago. The problem occurs intermittently. The problem has been waxing and waning since onset. The problem is moderate. The patient was exposed to food. The time of exposure was just prior to onset. Associated symptoms include wheezing (w/ episode, none now). Pertinent negatives include no chest pain, chest pressure, coughing, eye itching, globus sensation, itching, rash, stridor or trouble swallowing. Difficulty breathing: shallow sesation. Swelling is present on the lips. Past treatments include diphenhydramine. The treatment provided mild relief. Her past medical history is significant for food allergies and seasonal allergies.    Also reviewed chronic medical issues and interval medical events  Past Medical History  Diagnosis Date  . Obesity, unspecified   . MIGRAINE, COMMON   . OVARIAN CYST   . ALLERGIC RHINITIS   . Anemia   . ANXIETY   . DIABETES MELLITUS, TYPE II   . ADHD (attention deficit hyperactivity disorder)     Review of Systems  Constitutional: Negative for fever and fatigue.  HENT: Positive for facial swelling (lower lip w/ episode). Negative for sore throat and trouble swallowing.   Eyes: Negative for itching.  Respiratory: Positive for chest tightness (w/ episodes) and wheezing (w/ episode, none now). Negative for cough and stridor.   Cardiovascular: Negative for chest pain and leg swelling.  Skin: Negative for color change, itching and rash.  Allergic/Immunologic: Positive for food allergies.       Objective:   Physical Exam  BP 124/82  Pulse 91  Temp(Src) 98.3 F (36.8 C) (Oral)  Ht 5\' 4"  (1.626 m)  Wt 208 lb (94.348 kg)  BMI 35.69 kg/m2  SpO2 97% Wt Readings from Last 3 Encounters:  12/15/13 208 lb (94.348 kg)  09/30/13 204 lb 12.8 oz (92.897 kg)  09/14/13 202 lb  (91.627 kg)   Constitutional: She appears well-developed and well-nourished. No distress.  HEENT: no angioedema Neck: Normal range of motion. Neck supple. No JVD present. No thyromegaly present.  Cardiovascular: Normal rate, regular rhythm and normal heart sounds.  No murmur heard. No BLE edema. Pulmonary/Chest: Effort normal and breath sounds normal. No respiratory distress. She has no wheezes.  Psychiatric: She has a normal mood and affect. Her behavior is normal. Judgment and thought content normal.   Lab Results  Component Value Date   WBC 17.1* 01/16/2013   HGB 14.9 01/16/2013   HCT 43.2 01/16/2013   PLT 417* 01/16/2013   GLUCOSE 151* 01/16/2013   CHOL 200 01/11/2013   TRIG 97.0 01/11/2013   HDL 84.10 01/11/2013   LDLCALC 97 01/11/2013   ALT 11 01/16/2013   AST 15 01/16/2013   NA 132* 01/16/2013   K 4.3 01/16/2013   CL 100 01/16/2013   CREATININE 0.78 01/16/2013   BUN 12 01/16/2013   CO2 21 01/16/2013   TSH 1.86 01/11/2013   HGBA1C 6.6* 07/18/2013   MICROALBUR 0.8 01/11/2013    Dg Elbow Complete Right  09/30/2013   CLINICAL DATA:  Radial head fracture  EXAM: RIGHT ELBOW - COMPLETE 3+ VIEW  COMPARISON:  09/05/2013  FINDINGS: The radial head fracture is again identified. There is slight increase in the degree of displacement of the fracture fragment. The joint effusion remains anteriorly. No other focal abnormality is seen.  IMPRESSION: Slight increase in the degree of displacement of the radial head fracture.  Persistent effusion is noted.   Electronically Signed   By: Inez Catalina M.D.   On: 09/30/2013 09:32   Dg Wrist Complete Right  09/30/2013   CLINICAL DATA:  History of scaphoid fracture, followup  EXAM: RIGHT WRIST - COMPLETE 3+ VIEW  COMPARISON:  Right wrist films of 09/05/2013  FINDINGS: No acute fracture is seen. The radiocarpal joint space appears normal and the ulnar styloid is intact. The carpal bones are in normal position.  IMPRESSION: Negative.   Electronically Signed   By: Ivar Drape M.D.   On:  09/30/2013 09:31   [place] Extrem Up Right Ltd  09/30/2013   Limited muscular skeletal ultrasound was performed and interpreted by me  today. Limited ultrasound of the radial head does show that patient has  good callus formation and no significant displacement as seen with the  x-ray. Patient does still have some overlying hypoechoic area anteriorly  to this.  Impression: Healing mildly displaced radial head fracture.       Assessment & Plan:   Chicken allergy - repeat reactions reviewed No longer quickly responsive to antihistamine (Benadryl) as in past Has not required EpiPen use, but carries same Prior allergist testing with serum evaluation and skin testing reviewed - Repeat blood testing w/ CBC now for potential progression/changes Recommend daily antihistamine and reminded to use EpiPen if needed Followup with allergist if still unimproved or changing trend on repeat labs  Problem List Items Addressed This Visit   DIABETES MELLITUS, TYPE II      The current medical regimen is effective;  continue present plan and medications.  Reviewed weight trends and adverse effects on metabolic syndrome -  Consult by endo early 2014, but plans to follow with PCP  Lab Results  Component Value Date   HGBA1C 6.6* 07/18/2013      Relevant Orders      Hemoglobin A1c   Food allergy - Primary     Prior allergy eval for same - serum and skin testing w/ [facility]winkle Increasing severity of response ?more than "chicken" Recheck panel add daily antihistamine - claritin Reminded to use Epipen prn follow up allergist    Relevant Orders      Allergen food profile specific IgE      CBC with Differential

## 2013-12-16 LAB — ALLERGEN FOOD PROFILE SPECIFIC IGE
Apple: 0.44 kU/L — ABNORMAL HIGH
CORN: 0.26 kU/L — AB
Chicken IgE: 0.14 kU/L — ABNORMAL HIGH
Egg White IgE: 0.18 kU/L — ABNORMAL HIGH
Fish Cod: <0.1 kU/L
IGE (IMMUNOGLOBULIN E), SERUM: 27.6 [IU]/mL (ref 0.0–180.0)
MILK IGE: 0.25 kU/L — AB
Orange: 0.17 kU/L — ABNORMAL HIGH
Shrimp IgE: <0.1 kU/L
Soybean IgE: <0.1 kU/L
Tomato IgE: 0.22 kU/L — ABNORMAL HIGH
Wheat IgE: 0.9 kU/L — ABNORMAL HIGH

## 2013-12-26 ENCOUNTER — Encounter: Payer: Self-pay | Admitting: Internal Medicine

## 2014-01-14 ENCOUNTER — Other Ambulatory Visit: Payer: Self-pay | Admitting: Internal Medicine

## 2014-01-16 ENCOUNTER — Encounter: Payer: Self-pay | Admitting: Internal Medicine

## 2014-01-16 ENCOUNTER — Encounter: Payer: Self-pay | Admitting: *Deleted

## 2014-01-16 ENCOUNTER — Ambulatory Visit (INDEPENDENT_AMBULATORY_CARE_PROVIDER_SITE_OTHER): Payer: BC Managed Care – PPO | Admitting: Internal Medicine

## 2014-01-16 VITALS — BP 120/82 | HR 90 | Temp 98.3°F | Ht 64.0 in | Wt 216.0 lb

## 2014-01-16 DIAGNOSIS — E1165 Type 2 diabetes mellitus with hyperglycemia: Principal | ICD-10-CM

## 2014-01-16 DIAGNOSIS — F909 Attention-deficit hyperactivity disorder, unspecified type: Secondary | ICD-10-CM

## 2014-01-16 DIAGNOSIS — IMO0001 Reserved for inherently not codable concepts without codable children: Secondary | ICD-10-CM

## 2014-01-16 DIAGNOSIS — E669 Obesity, unspecified: Secondary | ICD-10-CM

## 2014-01-16 MED ORDER — ALPRAZOLAM 0.5 MG PO TABS: 0.5000 mg | ORAL_TABLET | Freq: Three times a day (TID) | ORAL | Status: DC | PRN

## 2014-01-16 MED ORDER — EPINEPHRINE 0.3 MG/0.3ML IJ SOAJ: 0.3000 mg | Freq: Once | INTRAMUSCULAR | Status: DC

## 2014-01-16 MED ORDER — ELETRIPTAN HYDROBROMIDE 40 MG PO TABS: ORAL_TABLET | ORAL | Status: DC

## 2014-01-16 MED ORDER — AMPHETAMINE-DEXTROAMPHET ER 20 MG PO CP24: 20.0000 mg | ORAL_CAPSULE | Freq: Every day | ORAL | Status: DC

## 2014-01-16 NOTE — Assessment & Plan Note (Signed)
The current medical regimen is effective;  continue present plan and medications.  Reviewed weight trends and adverse effects on metabolic syndrome -  Consult by endo early 2014, but plans to follow with PCP Follow a1c q3-4mo, microalb and lipids  Lab Results  Component Value Date   HGBA1C 7.4* 12/15/2013

## 2014-01-16 NOTE — Assessment & Plan Note (Signed)
Weight trends reviewed Wt Readings from Last 3 Encounters:  01/16/14 216 lb (97.977 kg)  12/15/13 208 lb (94.348 kg)  09/30/13 204 lb 12.8 oz (92.897 kg)   The patient is asked to make an attempt to improve diet and exercise patterns to aid in medical management of this problem. Consider Victoza if needed for DM to provide dual benefit

## 2014-01-16 NOTE — Progress Notes (Signed)
Pre visit review using our clinic review tool, if applicable. No additional management support is needed unless otherwise documented below in the visit note. 

## 2014-01-16 NOTE — Progress Notes (Signed)
Subjective:    Patient ID: Lisa Duran, female    DOB: 29-Nov-1980, 33 y.o.   MRN: [id number]  HPI  Patient is here for 6 mo follow up  Reviewed chronic medical issues and interval medical events  Past Medical History  Diagnosis Date  . Obesity, unspecified   . MIGRAINE, COMMON   . OVARIAN CYST   . ALLERGIC RHINITIS   . Anemia   . ANXIETY   . DIABETES MELLITUS, TYPE II   . ADHD (attention deficit hyperactivity disorder)     Review of Systems  Constitutional: Positive for unexpected weight change. Negative for fever and fatigue.  HENT: Ear pain: R side x 2 week, improved after tx at [facility].   Respiratory: Negative for cough and shortness of breath.   Cardiovascular: Negative for chest pain and leg swelling.       Objective:   Physical Exam  BP 120/82  Pulse 90  Temp(Src) 98.3 F (36.8 C) (Oral)  Ht 5\' 4"  (1.626 m)  Wt 216 lb (97.977 kg)  BMI 37.06 kg/m2  SpO2 95% Wt Readings from Last 3 Encounters:  01/16/14 216 lb (97.977 kg)  12/15/13 208 lb (94.348 kg)  09/30/13 204 lb 12.8 oz (92.897 kg)   Constitutional: She is obese, but appears well-developed and well-nourished. No distress.  HENT: B TMs clear - no OM or effusion on R Neck: Normal range of motion. Neck supple. No JVD present. No thyromegaly present.  Cardiovascular: Normal rate, regular rhythm and normal heart sounds.  No murmur heard. No BLE edema. Pulmonary/Chest: Effort normal and breath sounds normal. No respiratory distress. She has no wheezes.  Psychiatric: She has a normal mood and affect. Her behavior is normal. Judgment and thought content normal.   Lab Results  Component Value Date   WBC 10.1 12/15/2013   HGB 12.4 12/15/2013   HCT 38.2 12/15/2013   PLT 504.0* 12/15/2013   GLUCOSE 151* 01/16/2013   CHOL 200 01/11/2013   TRIG 97.0 01/11/2013   HDL 84.10 01/11/2013   LDLCALC 97 01/11/2013   ALT 11 01/16/2013   AST 15 01/16/2013   NA 132* 01/16/2013   K 4.3 01/16/2013   CL 100 01/16/2013   CREATININE 0.78  01/16/2013   BUN 12 01/16/2013   CO2 21 01/16/2013   TSH 1.86 01/11/2013   HGBA1C 7.4* 12/15/2013   MICROALBUR 0.8 01/11/2013    Dg Elbow Complete Right  09/30/2013   CLINICAL DATA:  Radial head fracture  EXAM: RIGHT ELBOW - COMPLETE 3+ VIEW  COMPARISON:  09/05/2013  FINDINGS: The radial head fracture is again identified. There is slight increase in the degree of displacement of the fracture fragment. The joint effusion remains anteriorly. No other focal abnormality is seen.  IMPRESSION: Slight increase in the degree of displacement of the radial head fracture. Persistent effusion is noted.   Electronically Signed   By: Inez Catalina M.D.   On: 09/30/2013 09:32   Dg Wrist Complete Right  09/30/2013   CLINICAL DATA:  History of scaphoid fracture, followup  EXAM: RIGHT WRIST - COMPLETE 3+ VIEW  COMPARISON:  Right wrist films of 09/05/2013  FINDINGS: No acute fracture is seen. The radiocarpal joint space appears normal and the ulnar styloid is intact. The carpal bones are in normal position.  IMPRESSION: Negative.   Electronically Signed   By: Ivar Drape M.D.   On: 09/30/2013 09:31   [place] Extrem Up Right Ltd  09/30/2013   Limited muscular skeletal ultrasound was  performed and interpreted by me  today. Limited ultrasound of the radial head does show that patient has  good callus formation and no significant displacement as seen with the  x-ray. Patient does still have some overlying hypoechoic area anteriorly  to this.  Impression: Healing mildly displaced radial head fracture.       Assessment & Plan:   Right ear pain, suspect resolved otitis externa based on history of improvement following treatment [facility]. No evidence of problem on exam today. Patient will call if recurrent or persisting symptoms for referral to ENT as needed  Problem List Items Addressed This Visit   ADHD (attention deficit hyperactivity disorder)     behav testing reviewed - ADHD confirmed on  battery testing 01/13/11 -  adderall started and uptitrated to current dose - doing well Continue same - refills today    OBESITY, UNSPECIFIED      Weight trends reviewed Wt Readings from Last 3 Encounters:  01/16/14 216 lb (97.977 kg)  12/15/13 208 lb (94.348 kg)  09/30/13 204 lb 12.8 oz (92.897 kg)   The patient is asked to make an attempt to improve diet and exercise patterns to aid in medical management of this problem. Consider Victoza if needed for DM to provide dual benefit    Relevant Medications      amphetamine-dextroamphetamine (ADDERALL XR) 24 hr capsule   Type II or unspecified type diabetes mellitus without mention of complication, uncontrolled - Primary      The current medical regimen is effective;  continue present plan and medications.  Reviewed weight trends and adverse effects on metabolic syndrome -  Consult by endo early 2014, but plans to follow with PCP Follow a1c q3-41mo, microalb and lipids  Lab Results  Component Value Date   HGBA1C 7.4* 12/15/2013      Relevant Orders      Hemoglobin A1c      Basic metabolic panel      Lipid panel      Microalbumin / creatinine urine ratio

## 2014-01-16 NOTE — Patient Instructions (Addendum)
It was good to see you today.  We have reviewed your prior records including labs and tests today  Test(s) ordered today. Your results will be released to [facility] (or called to you) after review, usually within 72hours after test completion. If any changes need to be made, you will be notified at that same time.  Medications reviewed and updated, no changes recommended at this time. Refill on medication(s) as discussed today.  Please schedule followup in 6 months, call sooner if problems.  Exercise to Lose Weight Exercise and a healthy diet may help you lose weight. Your doctor may suggest specific exercises. EXERCISE IDEAS AND TIPS  Choose low-cost things you enjoy doing, such as walking, bicycling, or exercising to workout videos.  Take stairs instead of the elevator.  Walk during your lunch break.  Park your car further away from work or school.  Go to a gym or an exercise class.  Start with 5 to 10 minutes of exercise each day. Build up to 30 minutes of exercise 4 to 6 days a week.  Wear shoes with good support and comfortable clothes.  Stretch before and after working out.  Work out until you breathe harder and your heart beats faster.  Drink extra water when you exercise.  Do not do so much that you hurt yourself, feel dizzy, or get very short of breath. Exercises that burn about 150 calories:  Running 1  miles in 15 minutes.  Playing volleyball for 45 to 60 minutes.  Washing and waxing a car for 45 to 60 minutes.  Playing touch football for 45 minutes.  Walking 1  miles in 35 minutes.  Pushing a stroller 1  miles in 30 minutes.  Playing basketball for 30 minutes.  Raking leaves for 30 minutes.  Bicycling 5 miles in 30 minutes.  Walking 2 miles in 30 minutes.  Dancing for 30 minutes.  Shoveling snow for 15 minutes.  Swimming laps for 20 minutes.  Walking up stairs for 15 minutes.  Bicycling 4 miles in 15 minutes.  Gardening for 30 to 45  minutes.  Jumping rope for 15 minutes.  Washing windows or floors for 45 to 60 minutes. Document Released: 08/30/2010 Document Revised: 10/20/2011 Document Reviewed: 08/30/2010 [facility] Patient Information 2014 [place], [place]. Diabetes and Exercise Exercising regularly is important. It is not just about losing weight. It has many health benefits, such as:  Improving your overall fitness, flexibility, and endurance.  Increasing your bone density.  Helping with weight control.  Decreasing your body fat.  Increasing your muscle strength.  Reducing stress and tension.  Improving your overall health. People with diabetes who exercise gain additional benefits because exercise:  Reduces appetite.  Improves the body's use of blood sugar (glucose).  Helps lower or control blood glucose.  Decreases blood pressure.  Helps control blood lipids (such as cholesterol and triglycerides).  Improves the body's use of the hormone insulin by:  Increasing the body's insulin sensitivity.  Reducing the body's insulin needs.  Decreases the risk for heart disease because exercising:  Lowers cholesterol and triglycerides levels.  Increases the levels of good cholesterol (such as high-density lipoproteins [HDL]) in the body.  Lowers blood glucose levels. YOUR ACTIVITY PLAN  Choose an activity that you enjoy and set realistic goals. Your health care provider or diabetes educator can help you make an activity plan that works for you. You can break activities into 2 or 3 sessions throughout the day. Doing so is as good as one long  session. Exercise ideas include:  Taking the dog for a walk.  Taking the stairs instead of the elevator.  Dancing to your favorite song.  Doing your favorite exercise with a friend. RECOMMENDATIONS FOR EXERCISING WITH TYPE 1 OR TYPE 2 DIABETES   Check your blood glucose before exercising. If blood glucose levels are greater than 240 mg/dL, check for urine  ketones. Do not exercise if ketones are present.  Avoid injecting insulin into areas of the body that are going to be exercised. For example, avoid injecting insulin into:  The arms when playing tennis.  The legs when jogging.  Keep a record of:  Food intake before and after you exercise.  Expected peak times of insulin action.  Blood glucose levels before and after you exercise.  The type and amount of exercise you have done.  Review your records with your health care provider. Your health care provider will help you to develop guidelines for adjusting food intake and insulin amounts before and after exercising.  If you take insulin or oral hypoglycemic agents, watch for signs and symptoms of hypoglycemia. They include:  Dizziness.  Shaking.  Sweating.  Chills.  Confusion.  Drink plenty of water while you exercise to prevent dehydration or heat stroke. Body water is lost during exercise and must be replaced.  Talk to your health care provider before starting an exercise program to make sure it is safe for you. Remember, almost any type of activity is better than none. Document Released: 10/18/2003 Document Revised: 03/30/2013 Document Reviewed: 01/04/2013 [facility] Patient Information 2014 [place].

## 2014-01-16 NOTE — Assessment & Plan Note (Signed)
behav testing reviewed - ADHD confirmed on battery testing 01/13/11 -  adderall started and uptitrated to current dose - doing well Continue same - refills today

## 2014-01-19 ENCOUNTER — Other Ambulatory Visit (INDEPENDENT_AMBULATORY_CARE_PROVIDER_SITE_OTHER): Payer: BC Managed Care – PPO

## 2014-01-19 ENCOUNTER — Encounter: Payer: Self-pay | Admitting: Internal Medicine

## 2014-01-19 DIAGNOSIS — IMO0001 Reserved for inherently not codable concepts without codable children: Secondary | ICD-10-CM

## 2014-01-19 DIAGNOSIS — E1165 Type 2 diabetes mellitus with hyperglycemia: Principal | ICD-10-CM

## 2014-01-19 LAB — HEMOGLOBIN A1C: HEMOGLOBIN A1C: 7.5 % — AB (ref 4.6–6.5)

## 2014-01-19 LAB — LIPID PANEL
CHOL/HDL RATIO: 3
Cholesterol: 200 mg/dL (ref 0–200)
HDL: 67.5 mg/dL (ref >39.00)
LDL CALC: 112 mg/dL — AB (ref 0–99)
NonHDL: 132.5
Triglycerides: 102 mg/dL (ref 0.0–149.0)
VLDL: 20.4 mg/dL (ref 0.0–40.0)

## 2014-01-19 LAB — BASIC METABOLIC PANEL
BUN: 9 mg/dL (ref 6–23)
CHLORIDE: 105 meq/L (ref 96–112)
CO2: 24 meq/L (ref 19–32)
Calcium: 9.5 mg/dL (ref 8.4–10.5)
Creatinine, Ser: 0.9 mg/dL (ref 0.4–1.2)
GFR: 80.76 mL/min (ref >60.00)
Glucose, Bld: 139 mg/dL — ABNORMAL HIGH (ref 70–99)
Potassium: 4.1 mEq/L (ref 3.5–5.1)
SODIUM: 137 meq/L (ref 135–145)

## 2014-01-19 LAB — MICROALBUMIN / CREATININE URINE RATIO
Creatinine,U: 101.3 mg/dL
Microalb Creat Ratio: 0.2 mg/g (ref 0.0–30.0)
Microalb, Ur: 0.2 mg/dL (ref 0.0–1.9)

## 2014-01-19 MED ORDER — LIRAGLUTIDE 18 MG/3ML ~~LOC~~ SOPN: 0.6000 mg | PEN_INJECTOR | Freq: Every day | SUBCUTANEOUS | Status: DC

## 2014-01-20 ENCOUNTER — Other Ambulatory Visit: Payer: Self-pay | Admitting: *Deleted

## 2014-01-20 MED ORDER — INSULIN PEN NEEDLE 32G X 4 MM MISC: 1.0000 | Freq: Every morning | Status: DC

## 2014-01-20 NOTE — Telephone Encounter (Signed)
Pt sent email needing rx for pen needles for her victoza...Johny Chess

## 2014-03-09 ENCOUNTER — Other Ambulatory Visit: Payer: Self-pay | Admitting: Internal Medicine

## 2014-03-09 ENCOUNTER — Encounter: Payer: Self-pay | Admitting: Internal Medicine

## 2014-03-09 MED ORDER — AMPHETAMINE-DEXTROAMPHET ER 20 MG PO CP24: 20.0000 mg | ORAL_CAPSULE | Freq: Every day | ORAL | Status: DC

## 2014-03-09 NOTE — Telephone Encounter (Signed)
Topamax script has been sent back electronically...Lisa Duran

## 2014-03-09 NOTE — Addendum Note (Signed)
Addended by: Gwendolyn Grant A on: 03/09/2014 05:58 PM   Modules accepted: Orders

## 2014-03-10 NOTE — Telephone Encounter (Signed)
Place adderall rx for pick-up...Lisa Duran

## 2014-03-13 ENCOUNTER — Other Ambulatory Visit: Payer: Self-pay | Admitting: *Deleted

## 2014-03-13 MED ORDER — TOPIRAMATE 25 MG PO TABS: ORAL_TABLET | ORAL | Status: DC

## 2014-03-13 NOTE — Telephone Encounter (Signed)
Sent email need a 30 day sent to local pharmacy on her topamax until she received mail service....Lisa Duran

## 2014-05-01 ENCOUNTER — Encounter: Payer: Self-pay | Admitting: Internal Medicine

## 2014-05-01 MED ORDER — LIRAGLUTIDE 18 MG/3ML ~~LOC~~ SOPN: 1.8000 mg | PEN_INJECTOR | Freq: Every day | SUBCUTANEOUS | Status: DC

## 2014-05-02 ENCOUNTER — Other Ambulatory Visit: Payer: Self-pay | Admitting: Internal Medicine

## 2014-05-03 ENCOUNTER — Other Ambulatory Visit: Payer: Self-pay

## 2014-05-03 MED ORDER — LIRAGLUTIDE 18 MG/3ML ~~LOC~~ SOPN: 1.8000 mg | PEN_INJECTOR | Freq: Every day | SUBCUTANEOUS | Status: DC

## 2014-05-04 ENCOUNTER — Encounter: Payer: Self-pay | Admitting: Internal Medicine

## 2014-05-08 ENCOUNTER — Other Ambulatory Visit: Payer: Self-pay

## 2014-05-08 ENCOUNTER — Encounter: Payer: Self-pay | Admitting: Internal Medicine

## 2014-05-08 MED ORDER — INSULIN PEN NEEDLE 32G X 4 MM MISC: Status: DC

## 2014-06-02 ENCOUNTER — Encounter: Payer: Self-pay | Admitting: Internal Medicine

## 2014-06-04 ENCOUNTER — Other Ambulatory Visit: Payer: Self-pay | Admitting: Internal Medicine

## 2014-06-07 ENCOUNTER — Other Ambulatory Visit: Payer: Self-pay | Admitting: Internal Medicine

## 2014-06-09 LAB — HM DIABETES EYE EXAM

## 2014-06-10 ENCOUNTER — Encounter: Payer: Self-pay | Admitting: Internal Medicine

## 2014-06-12 ENCOUNTER — Encounter: Payer: Self-pay | Admitting: Family

## 2014-06-12 ENCOUNTER — Encounter (HOSPITAL_BASED_OUTPATIENT_CLINIC_OR_DEPARTMENT_OTHER): Payer: Self-pay | Admitting: *Deleted

## 2014-06-12 ENCOUNTER — Emergency Department (HOSPITAL_BASED_OUTPATIENT_CLINIC_OR_DEPARTMENT_OTHER): Payer: BC Managed Care – PPO

## 2014-06-12 ENCOUNTER — Emergency Department (HOSPITAL_BASED_OUTPATIENT_CLINIC_OR_DEPARTMENT_OTHER)
Admission: EM | Admit: 2014-06-12 | Discharge: 2014-06-12 | Disposition: A | Discharge status: Home / Self Care | Payer: BC Managed Care – PPO | Attending: Emergency Medicine | Admitting: Emergency Medicine

## 2014-06-12 ENCOUNTER — Ambulatory Visit (INDEPENDENT_AMBULATORY_CARE_PROVIDER_SITE_OTHER): Payer: BC Managed Care – PPO | Admitting: Family

## 2014-06-12 ENCOUNTER — Telehealth: Payer: Self-pay | Admitting: Internal Medicine

## 2014-06-12 VITALS — BP 126/82 | HR 123 | Temp 98.1°F | Resp 18 | Ht 64.0 in | Wt 210.4 lb

## 2014-06-12 DIAGNOSIS — E669 Obesity, unspecified: Secondary | ICD-10-CM | POA: Insufficient documentation

## 2014-06-12 DIAGNOSIS — R079 Chest pain, unspecified: Secondary | ICD-10-CM | POA: Diagnosis present

## 2014-06-12 DIAGNOSIS — R0789 Other chest pain: Secondary | ICD-10-CM | POA: Diagnosis not present

## 2014-06-12 DIAGNOSIS — Z7982 Long term (current) use of aspirin: Secondary | ICD-10-CM | POA: Diagnosis not present

## 2014-06-12 DIAGNOSIS — M791 Myalgia: Secondary | ICD-10-CM

## 2014-06-12 DIAGNOSIS — Z79899 Other long term (current) drug therapy: Secondary | ICD-10-CM | POA: Diagnosis not present

## 2014-06-12 DIAGNOSIS — F411 Generalized anxiety disorder: Secondary | ICD-10-CM | POA: Insufficient documentation

## 2014-06-12 DIAGNOSIS — Z724 Inappropriate diet and eating habits: Secondary | ICD-10-CM | POA: Insufficient documentation

## 2014-06-12 DIAGNOSIS — D649 Anemia, unspecified: Secondary | ICD-10-CM | POA: Diagnosis not present

## 2014-06-12 DIAGNOSIS — R5383 Other fatigue: Secondary | ICD-10-CM | POA: Diagnosis not present

## 2014-06-12 DIAGNOSIS — R000 Tachycardia, unspecified: Secondary | ICD-10-CM

## 2014-06-12 DIAGNOSIS — F909 Attention-deficit hyperactivity disorder, unspecified type: Secondary | ICD-10-CM | POA: Diagnosis not present

## 2014-06-12 DIAGNOSIS — Z3202 Encounter for pregnancy test, result negative: Secondary | ICD-10-CM | POA: Diagnosis not present

## 2014-06-12 DIAGNOSIS — G43909 Migraine, unspecified, not intractable, without status migrainosus: Secondary | ICD-10-CM | POA: Diagnosis not present

## 2014-06-12 DIAGNOSIS — M7918 Myalgia, other site: Secondary | ICD-10-CM

## 2014-06-12 DIAGNOSIS — E119 Type 2 diabetes mellitus without complications: Secondary | ICD-10-CM | POA: Diagnosis not present

## 2014-06-12 DIAGNOSIS — Z794 Long term (current) use of insulin: Secondary | ICD-10-CM | POA: Diagnosis not present

## 2014-06-12 DIAGNOSIS — Z87891 Personal history of nicotine dependence: Secondary | ICD-10-CM | POA: Insufficient documentation

## 2014-06-12 DIAGNOSIS — R202 Paresthesia of skin: Secondary | ICD-10-CM | POA: Insufficient documentation

## 2014-06-12 DIAGNOSIS — N8320 Unspecified ovarian cysts: Secondary | ICD-10-CM | POA: Diagnosis not present

## 2014-06-12 LAB — COMPREHENSIVE METABOLIC PANEL
ALK PHOS: 163 U/L — AB (ref 39–117)
ALT: 17 U/L (ref 0–35)
ANION GAP: 17 — AB (ref 5–15)
AST: 17 U/L (ref 0–37)
Albumin: 3.9 g/dL (ref 3.5–5.2)
BUN: 5 mg/dL — ABNORMAL LOW (ref 6–23)
CHLORIDE: 102 meq/L (ref 96–112)
CO2: 20 meq/L (ref 19–32)
Calcium: 9.6 mg/dL (ref 8.4–10.5)
Creatinine, Ser: 0.8 mg/dL (ref 0.50–1.10)
GLUCOSE: 167 mg/dL — AB (ref 70–99)
POTASSIUM: 3.6 meq/L — AB (ref 3.7–5.3)
Sodium: 139 mEq/L (ref 137–147)
Total Bilirubin: 0.2 mg/dL — ABNORMAL LOW (ref 0.3–1.2)
Total Protein: 7.5 g/dL (ref 6.0–8.3)

## 2014-06-12 LAB — CBC WITH DIFFERENTIAL/PLATELET
Basophils Absolute: 0 10*3/uL (ref 0.0–0.1)
Basophils Relative: 0 % (ref 0–1)
Eosinophils Absolute: 0.1 10*3/uL (ref 0.0–0.7)
Eosinophils Relative: 1 % (ref 0–5)
HEMATOCRIT: 36.5 % (ref 36.0–46.0)
HEMOGLOBIN: 11.5 g/dL — AB (ref 12.0–15.0)
Lymphocytes Relative: 25 % (ref 12–46)
Lymphs Abs: 2.2 10*3/uL (ref 0.7–4.0)
MCH: 23.1 pg — ABNORMAL LOW (ref 26.0–34.0)
MCHC: 31.5 g/dL (ref 30.0–36.0)
MCV: 73.4 fL — ABNORMAL LOW (ref 78.0–100.0)
MONO ABS: 0.7 10*3/uL (ref 0.1–1.0)
MONOS PCT: 8 % (ref 3–12)
NEUTROS ABS: 6 10*3/uL (ref 1.7–7.7)
Neutrophils Relative %: 66 % (ref 43–77)
Platelets: 518 10*3/uL — ABNORMAL HIGH (ref 150–400)
RBC: 4.97 MIL/uL (ref 3.87–5.11)
RDW: 17 % — ABNORMAL HIGH (ref 11.5–15.5)
WBC: 9 10*3/uL (ref 4.0–10.5)

## 2014-06-12 LAB — CK: Total CK: 96 U/L (ref 7–177)

## 2014-06-12 LAB — URINALYSIS, ROUTINE W REFLEX MICROSCOPIC
Bilirubin Urine: NEGATIVE
Glucose, UA: NEGATIVE mg/dL
Ketones, ur: NEGATIVE mg/dL
NITRITE: NEGATIVE
Protein, ur: NEGATIVE mg/dL
Specific Gravity, Urine: 1.016 (ref 1.005–1.030)
UROBILINOGEN UA: 0.2 mg/dL (ref 0.0–1.0)
pH: 6.5 (ref 5.0–8.0)

## 2014-06-12 LAB — URINE MICROSCOPIC-ADD ON

## 2014-06-12 LAB — PREGNANCY, URINE: Preg Test, Ur: NEGATIVE

## 2014-06-12 LAB — TROPONIN I: Troponin I: <0.3 ng/mL (ref <0.30)

## 2014-06-12 LAB — D-DIMER, QUANTITATIVE: D-Dimer, Quant: 0.31 ug/mL-FEU (ref 0.00–0.48)

## 2014-06-12 MED ORDER — KETOROLAC TROMETHAMINE 30 MG/ML IJ SOLN
INTRAMUSCULAR | Status: AC
  Administered 2014-06-12: 30 mg
  Filled 2014-06-12: qty 1

## 2014-06-12 NOTE — ED Notes (Signed)
MD at bedside. 

## 2014-06-12 NOTE — ED Notes (Signed)
Patient transported to X-ray 

## 2014-06-12 NOTE — ED Notes (Signed)
D/c home- no New Rx given-

## 2014-06-12 NOTE — Patient Instructions (Signed)
Please proceed directly to the ED for further evaluation on the first floor.

## 2014-06-12 NOTE — ED Notes (Signed)
Pt c/o generalized weakness that began Friday. Saturday, she began to have pains in her legs and yesterday, she began having right facial pain. She sts this morning, she awoke with chest tightness feeling "like I'm wearing a corset".

## 2014-06-12 NOTE — Telephone Encounter (Signed)
Patient Information:  Caller Name: [place]  Phone: [phone]  Patient: Lisa Duran, Lisa Duran  Gender: Female  DOB: 11/01/1980  Age: 33 Years  PCP: Gwendolyn Grant (Adults only)  Pregnant: No  Office Follow Up:  Does the office need to follow up with this patient?: No  Instructions For The Office: N/A  RN Note:  No appts. today at [facility]. Will schedule at [facility] at 11:30(06/12/14) which is close to her. If worsens will call 911.  Symptoms  Reason For Call & Symptoms: Onset 06/09/14 started with weakness in legs. On 06/11/14, had sharp pain in front of the Rt. ear and radiated into cheek and jaw. Hx of migraines, but migraine med did not help. This morning(06/12/14) rib cage feels tight. No dizziness or SOB. Feels nauseated. Woke up sweaty this morning(06/12/14).  Reviewed Health History In EMR: Yes  Reviewed Medications In EMR: Yes  Reviewed Allergies In EMR: Yes  Reviewed Surgeries / Procedures: Yes  Date of Onset of Symptoms: 06/09/2014 OB / GYN:  LMP: 05/25/2014  Guideline(s) Used:  Chest Pain  Disposition Per Guideline:   Go to ED Now  Reason For Disposition Reached:   Pain also present in shoulder(s) or arm(s) or jaw  Advice Given:  Call Back If:  Severe chest pain  You become worse.  Patient Will Follow Care Advice:  YES

## 2014-06-12 NOTE — Progress Notes (Signed)
Subjective:    Patient ID: Lisa Duran, female    DOB: [birth year], 33 y.o.   MRN: [id number]  HPI   [name] is a 33 yr old female who presents today with multiple complaints.   Pain/numbness/tingling right side of her face. Was sharp and shooting, now numb. Took relpax without improvement.  This AM area feels numb.  Reports that on Friday she developed weakness "all over."  Reports that her thighs felt "tight" had trouble bending down.  Reports muscles are tense and stiff.  Has tingling from toes to knees.   Has weak aching area in her shoulders.  Chest- woke up with chest pain. Reports that it felt "like I was going to throw up." not changed with exertion.  Reports mild SOB with walking.  No recent changes in her exercises.  Denies previous hx of chest pain.  Reports that she does not feel anxious.  Reports that she can't seem to get her heart rate to go down.    Review of Systems See  HPI  Past Medical History  Diagnosis Date  . Obesity, unspecified   . MIGRAINE, COMMON   . OVARIAN CYST   . ALLERGIC RHINITIS   . Anemia   . ANXIETY   . DIABETES MELLITUS, TYPE II   . ADHD (attention deficit hyperactivity disorder)     History   Social History  . Marital Status: Married    Spouse Name: N/A    Number of Children: N/A  . Years of Education: N/A   Occupational History  . Not on file.   Social History Main Topics  . Smoking status: Former Smoker    Quit date: 08/20/2002  . Smokeless tobacco: Never Used     Comment: single, separated from spouse 02/2010.   [name] Alcohol Use: Yes     Comment: occasionally  . Drug Use: No  . Sexual Activity: Not on file   Other Topics Concern  . Not on file   Social History Narrative   Single, separated from spouse 02/2010    Past Surgical History  Procedure Laterality Date  . No past surgeries      Family History  Problem Relation Age of Onset  . Diabetes Mother   . Hyperlipidemia Mother   . Diabetes Father   .  Hyperlipidemia Father   . Coronary artery disease Father   . Hypertension Father   . Cancer Father     esoph  . Cancer Maternal Grandfather 45    lung, met    Allergies  Allergen Reactions  . Chicken Allergy   . Other     Any Nuts     Current Outpatient Prescriptions on File Prior to Visit  Medication Sig Dispense Refill  . ALPRAZolam (XANAX) 0.5 MG tablet Take 1 tablet (0.5 mg total) by mouth 3 (three) times daily as needed for anxiety. 30 tablet 1  . amphetamine-dextroamphetamine (ADDERALL XR) 20 MG 24 hr capsule Take 1 capsule (20 mg total) by mouth daily. 30 capsule 0  . aspirin 81 MG tablet Take 81 mg by mouth daily.      . Cholecalciferol (VITAMIN D) 2000 UNITS CAPS Take 2,000 Units by mouth daily.     . citalopram (CELEXA) 20 MG tablet TAKE 1 TABLET DAILY 90 tablet 2  . eletriptan (RELPAX) 40 MG tablet TAKE 1 TABLET AT ONSET OF HEADACHE, MAY REPEAT IN 2 HOURS IF HEADACHE PERSISTS OR RECURS. 8 tablet 1  . EPINEPHrine (EPIPEN) 0.3 mg/0.3 mL IJ  SOAJ injection Inject 0.3 mLs (0.3 mg total) into the muscle once. 1 Device 1  . ferrous sulfate (IRON SUPPLEMENT) 325 (65 FE) MG tablet Take 325 mg by mouth daily with breakfast.      . glucose blood (TRUETEST TEST) test strip Use as instructed 100 each 1  . Insulin Pen Needle (BD PEN NEEDLE NANO U/F) 32G X 4 MM MISC USE 1 PEN NEEDLE TO ADMINISTER VICTOZA EVERY MORNING 300 each 2  . lamoTRIgine (LAMICTAL) 100 MG tablet TAKE 1 TABLET TWICE A DAY 180 tablet 0  . levonorgestrel (MIRENA) 20 MCG/24HR IUD 1 Intra Uterine Device (1 each total) by Intrauterine route once. 1 each 0  . metFORMIN (GLUCOPHAGE) 500 MG tablet TAKE 1 TABLET TWICE A DAY WITH MEALS 180 tablet 3  . promethazine (PHENERGAN) 25 MG tablet Take 25 mg by mouth every 6 (six) hours as needed for nausea.    [name] topiramate (TOPAMAX) 25 MG tablet TAKE 2 TABLETS TWICE A DAY 120 tablet 0  . traMADol-acetaminophen (ULTRACET) 37.5-325 MG per tablet Take 1 tablet by mouth every 6 (six)  hours as needed for pain.    [name] triamcinolone (KENALOG) 0.1 % cream Apply topically 2 (two) times daily as needed. 30 g 1   No current facility-administered medications on file prior to visit.    BP 126/82 mmHg  Pulse 123  Temp(Src) 98.1 F (36.7 C) (Oral)  Resp 18  Ht '5\' 4"'  (1.626 m)  Wt 210 lb 6.4 oz (95.437 kg)  BMI 36.10 kg/m2  SpO2 100%  LMP 05/22/2014       Objective:   Physical Exam  Constitutional: She is oriented to person, place, and time. She appears well-developed and well-nourished. No distress.  HENT:  Head: Normocephalic and atraumatic.  Cardiovascular: Normal rate and regular rhythm.   No murmur heard. Pulmonary/Chest: Effort normal and breath sounds normal. No respiratory distress. She has no wheezes. She has no rales. She exhibits no tenderness.  Abdominal: Soft. Bowel sounds are normal. She exhibits no distension and no mass. There is no tenderness. There is no rebound and no guarding.  Musculoskeletal: She exhibits no edema.  Neurological: She is alert and oriented to person, place, and time.  No facial drooping is noted.           Assessment & Plan:

## 2014-06-12 NOTE — Progress Notes (Signed)
Pre visit review using our clinic review tool, if applicable. No additional management support is needed unless otherwise documented below in the visit note. 

## 2014-06-12 NOTE — Assessment & Plan Note (Addendum)
33 yr old female with atypical CP, Tachycardia, mild associated SOB, weakness.  Needs eval in ED to rule out PE, rule out MI.  EKG was performed today in the office but due to issues with technology, EKG machine would not print EKG.  Review of EKG on monitor noted NSR without acute ischemic changes.

## 2014-06-12 NOTE — ED Notes (Signed)
Pt returned from xr. Ambulatory and in NAD.

## 2014-06-12 NOTE — ED Provider Notes (Signed)
CSN: [id number]     Arrival date & time 06/12/14  1216 History   First MD Initiated Contact with Patient 06/12/14 1221     Chief Complaint  Patient presents with  . Chest Pain     (Consider location/radiation/quality/duration/timing/severity/associated sxs/prior Treatment) HPI Comments: Patient is a 33 year old female with history of type 2 diabetes, migraines, anxiety. She presents from her primary care doctor's office for evaluation of weakness, tightness in her chest, leg cramps, and feeling short of breath that has worsened over the past 2 days. She denies any fevers or chills. She denies any productive cough.  Patient is a 33 y.o. female presenting with chest pain. The history is provided by the patient.  Chest Pain Pain location:  Substernal area Pain quality: tightness   Pain radiates to:  Does not radiate Pain radiates to the back: no   Pain severity:  Mild Onset quality:  Sudden Duration:  1 day Timing:  Constant Progression:  Partially resolved Chronicity:  New Relieved by:  Nothing Worsened by:  Nothing tried Ineffective treatments:  None tried Associated symptoms: fatigue     Past Medical History  Diagnosis Date  . Obesity, unspecified   . MIGRAINE, COMMON   . OVARIAN CYST   . ALLERGIC RHINITIS   . Anemia   . ANXIETY   . DIABETES MELLITUS, TYPE II   . ADHD (attention deficit hyperactivity disorder)    Past Surgical History  Procedure Laterality Date  . No past surgeries     Family History  Problem Relation Age of Onset  . Diabetes Mother   . Hyperlipidemia Mother   . Diabetes Father   . Hyperlipidemia Father   . Coronary artery disease Father   . Hypertension Father   . Cancer Father     esoph  . Cancer Maternal Grandfather 40    lung, met   History  Substance Use Topics  . Smoking status: Former Smoker    Quit date: 08/20/2002  . Smokeless tobacco: Never Used     Comment: single, separated from spouse 02/2010.   [name] Alcohol Use: Yes      Comment: occasionally   OB History    No data available     Review of Systems  Constitutional: Positive for fatigue.  Cardiovascular: Positive for chest pain.  All other systems reviewed and are negative.     Allergies  Chicken allergy and Other  Home Medications   Prior to Admission medications   Medication Sig Start Date End Date Taking? Authorizing Provider  ALPRAZolam Duanne Moron) 0.5 MG tablet Take 1 tablet (0.5 mg total) by mouth 3 (three) times daily as needed for anxiety. 01/16/14   Rowe Clack, MD  amphetamine-dextroamphetamine (ADDERALL XR) 20 MG 24 hr capsule Take 1 capsule (20 mg total) by mouth daily. 03/09/14   Rowe Clack, MD  aspirin 81 MG tablet Take 81 mg by mouth daily.      Historical Provider, MD  Cholecalciferol (VITAMIN D) 2000 UNITS CAPS Take 2,000 Units by mouth daily.     Historical Provider, MD  citalopram (CELEXA) 20 MG tablet TAKE 1 TABLET DAILY 06/05/14   Rowe Clack, MD  eletriptan (RELPAX) 40 MG tablet TAKE 1 TABLET AT ONSET OF HEADACHE, MAY REPEAT IN 2 HOURS IF HEADACHE PERSISTS OR RECURS. 01/16/14   Rowe Clack, MD  EPINEPHrine (EPIPEN) 0.3 mg/0.3 mL IJ SOAJ injection Inject 0.3 mLs (0.3 mg total) into the muscle once. 01/16/14   Rowe Clack, MD  ferrous sulfate (IRON SUPPLEMENT) 325 (65 FE) MG tablet Take 325 mg by mouth daily with breakfast.      Historical Provider, MD  glucose blood (TRUETEST TEST) test strip Use as instructed 01/11/13   Rowe Clack, MD  Insulin Pen Needle (BD PEN NEEDLE NANO U/F) 32G X 4 MM MISC USE 1 PEN NEEDLE TO ADMINISTER VICTOZA EVERY MORNING 05/08/14   Rowe Clack, MD  lamoTRIgine (LAMICTAL) 100 MG tablet TAKE 1 TABLET TWICE A DAY 06/07/14   Rowe Clack, MD  levonorgestrel (MIRENA) 20 MCG/24HR IUD 1 Intra Uterine Device (1 each total) by Intrauterine route once. 10/03/13   Rowe Clack, MD  metFORMIN (GLUCOPHAGE) 500 MG tablet TAKE 1 TABLET TWICE A DAY WITH MEALS 01/14/14    Rowe Clack, MD  promethazine (PHENERGAN) 25 MG tablet Take 25 mg by mouth every 6 (six) hours as needed for nausea. 01/27/11   Rowe Clack, MD  topiramate (TOPAMAX) 25 MG tablet TAKE 2 TABLETS TWICE A DAY 03/13/14   Rowe Clack, MD  traMADol-acetaminophen (ULTRACET) 37.5-325 MG per tablet Take 1 tablet by mouth every 6 (six) hours as needed for pain. 01/26/12   Rowe Clack, MD  triamcinolone (KENALOG) 0.1 % cream Apply topically 2 (two) times daily as needed. 01/27/11   Rowe Clack, MD   BP 122/90 mmHg  Pulse 112  Temp(Src) 98.2 F (36.8 C) (Oral)  Resp 16  Ht $R'5\' 4"'dy$  (1.626 m)  Wt 210 lb (95.255 kg)  BMI 36.03 kg/m2  SpO2 100%  LMP 05/22/2014 Physical Exam  Constitutional: She is oriented to person, place, and time. She appears well-developed and well-nourished. No distress.  HENT:  Head: Normocephalic and atraumatic.  Neck: Normal range of motion. Neck supple.  Cardiovascular: Normal rate and regular rhythm.  Exam reveals no gallop and no friction rub.   No murmur heard. Pulmonary/Chest: Effort normal and breath sounds normal. No respiratory distress. She has no wheezes.  Abdominal: Soft. Bowel sounds are normal. She exhibits no distension. There is no tenderness.  Musculoskeletal: Normal range of motion.  Neurological: She is alert and oriented to person, place, and time.  Skin: Skin is warm and dry. She is not diaphoretic.  Nursing note and vitals reviewed.   ED Course  Procedures (including critical care time) Labs Review Labs Reviewed  CBC WITH DIFFERENTIAL  TROPONIN I  COMPREHENSIVE METABOLIC PANEL  URINALYSIS, ROUTINE W REFLEX MICROSCOPIC  PREGNANCY, URINE  CK    Imaging Review No results found.   Date: 06/12/2014  Rate: 119  Rhythm: sinus tachycardia  QRS Axis: normal  Intervals: normal  ST/T Wave abnormalities: nonspecific T wave changes  Conduction Disutrbances:none  Narrative Interpretation:   Old EKG Reviewed:  unchanged    MDM   Final diagnoses:  None    Patient is a 33 year old female sent from the [facility] clinic for evaluation of chest tightness. This was proceeded by an episode of bilateral leg cramping that occurred 2 days ago. This morning she woke with tightness across her chest, "like she was wearing a corset". Workup reveals an unchanged EKG, with the exception of tachycardia, the etiology of which I am uncertain. Her d-dimer is negative, oxygen saturations are 100%, and I highly doubt PE.  Her troponin is negative and this does not sound cardiac in etiology. The remainder the laboratory studies are essentially unremarkable with the exception of an iron deficiency anemia.  She was given a dose of Toradol which seems to  help. When reexamined, her heart rate was 105 and she appears clinically well in no acute distress. I am uncertain as to the exact etiology of her symptoms, however I do not feel as though there is an emergent process. I suspect there may be a component of anxiety as she reports a history of this and I cannot find an alternate explanation. She will be discharged to home with instructions to follow-up with her primary doctor. I attempted to reach Debbrah Alar who is the patient's primary provider, however she was unreachable at the time of my phone call.    Veryl Speak, MD 06/12/14 [phone]

## 2014-06-12 NOTE — Assessment & Plan Note (Signed)
After release from ED, consider further work up to include b12, folate, ? MRI brain to exclude MS.

## 2014-06-12 NOTE — Discharge Instructions (Signed)
Return to the emergency department if your symptoms substantially worsen or change.  Be sure to follow-up with your primary care provider within the next week for a recheck.   Chest Pain (Nonspecific) It is often hard to give a specific diagnosis for the cause of chest pain. There is always a chance that your pain could be related to something serious, such as a heart attack or a blood clot in the lungs. You need to follow up with your health care provider for further evaluation. CAUSES   Heartburn.  Pneumonia or bronchitis.  Anxiety or stress.  Inflammation around your heart (pericarditis) or lung (pleuritis or pleurisy).  A blood clot in the lung.  A collapsed lung (pneumothorax). It can develop suddenly on its own (spontaneous pneumothorax) or from trauma to the chest.  Shingles infection (herpes zoster virus). The chest wall is composed of bones, muscles, and cartilage. Any of these can be the source of the pain.  The bones can be bruised by injury.  The muscles or cartilage can be strained by coughing or overwork.  The cartilage can be affected by inflammation and become sore (costochondritis). DIAGNOSIS  Lab tests or other studies may be needed to find the cause of your pain. Your health care provider may have you take a test called an ambulatory electrocardiogram (ECG). An ECG records your heartbeat patterns over a 24-hour period. You may also have other tests, such as:  Transthoracic echocardiogram (TTE). During echocardiography, sound waves are used to evaluate how blood flows through your heart.  Transesophageal echocardiogram (TEE).  Cardiac monitoring. This allows your health care provider to monitor your heart rate and rhythm in real time.  Holter monitor. This is a portable device that records your heartbeat and can help diagnose heart arrhythmias. It allows your health care provider to track your heart activity for several days, if needed.  Stress tests by  exercise or by giving medicine that makes the heart beat faster. TREATMENT   Treatment depends on what may be causing your chest pain. Treatment may include:  Acid blockers for heartburn.  Anti-inflammatory medicine.  Pain medicine for inflammatory conditions.  Antibiotics if an infection is present.  You may be advised to change lifestyle habits. This includes stopping smoking and avoiding alcohol, caffeine, and chocolate.  You may be advised to keep your head raised (elevated) when sleeping. This reduces the chance of acid going backward from your stomach into your esophagus. Most of the time, nonspecific chest pain will improve within 2-3 days with rest and mild pain medicine.  HOME CARE INSTRUCTIONS   If antibiotics were prescribed, take them as directed. Finish them even if you start to feel better.  For the next few days, avoid physical activities that bring on chest pain. Continue physical activities as directed.  Do not use any tobacco products, including cigarettes, chewing tobacco, or electronic cigarettes.  Avoid drinking alcohol.  Only take medicine as directed by your health care provider.  Follow your health care provider's suggestions for further testing if your chest pain does not go away.  Keep any follow-up appointments you made. If you do not go to an appointment, you could develop lasting (chronic) problems with pain. If there is any problem keeping an appointment, call to reschedule. SEEK MEDICAL CARE IF:   Your chest pain does not go away, even after treatment.  You have a rash with blisters on your chest.  You have a fever. SEEK IMMEDIATE MEDICAL CARE IF:   You  have increased chest pain or pain that spreads to your arm, neck, jaw, back, or abdomen.  You have shortness of breath.  You have an increasing cough, or you cough up blood.  You have severe back or abdominal pain.  You feel nauseous or vomit.  You have severe weakness.  You  faint.  You have chills. This is an emergency. Do not wait to see if the pain will go away. Get medical help at once. Call your local emergency services (911 in [place] Do not drive yourself to the hospital. MAKE SURE YOU:   Understand these instructions.  Will watch your condition.  Will get help right away if you are not doing well or get worse. Document Released: 05/07/2005 Document Revised: 08/02/2013 Document Reviewed: 03/02/2008 [facility] Patient Information 2015 [place], [place]. This information is not intended to replace advice given to you by your health care provider. Make sure you discuss any questions you have with your health care provider.

## 2014-06-16 DIAGNOSIS — M7918 Myalgia, other site: Secondary | ICD-10-CM | POA: Insufficient documentation

## 2014-06-16 DIAGNOSIS — R000 Tachycardia, unspecified: Secondary | ICD-10-CM | POA: Insufficient documentation

## 2014-06-16 NOTE — Assessment & Plan Note (Signed)
Pt with tachycardia, sob, CP- needs eval to rule out MI and rule out PE. Pt referred to ED report given to charge nurse Stebbins.

## 2014-06-16 NOTE — Assessment & Plan Note (Signed)
?   Trigeminal neuralgia. Could consider trial of gabapentin if symptoms do not improve.

## 2014-06-20 ENCOUNTER — Ambulatory Visit (INDEPENDENT_AMBULATORY_CARE_PROVIDER_SITE_OTHER): Payer: BC Managed Care – PPO | Admitting: Internal Medicine

## 2014-06-20 ENCOUNTER — Other Ambulatory Visit (INDEPENDENT_AMBULATORY_CARE_PROVIDER_SITE_OTHER): Payer: BC Managed Care – PPO

## 2014-06-20 ENCOUNTER — Ambulatory Visit: Payer: BC Managed Care – PPO | Admitting: Internal Medicine

## 2014-06-20 ENCOUNTER — Encounter: Payer: Self-pay | Admitting: Internal Medicine

## 2014-06-20 VITALS — BP 108/80 | HR 112 | Temp 98.4°F | Ht 64.0 in | Wt 209.1 lb

## 2014-06-20 DIAGNOSIS — E1140 Type 2 diabetes mellitus with diabetic neuropathy, unspecified: Secondary | ICD-10-CM

## 2014-06-20 DIAGNOSIS — E1149 Type 2 diabetes mellitus with other diabetic neurological complication: Secondary | ICD-10-CM

## 2014-06-20 DIAGNOSIS — R202 Paresthesia of skin: Secondary | ICD-10-CM

## 2014-06-20 DIAGNOSIS — G43009 Migraine without aura, not intractable, without status migrainosus: Secondary | ICD-10-CM

## 2014-06-20 LAB — SEDIMENTATION RATE: Sed Rate: 38 mm/hr — ABNORMAL HIGH (ref 0–22)

## 2014-06-20 LAB — HEMOGLOBIN A1C: Hgb A1c MFr Bld: 7.6 % — ABNORMAL HIGH (ref 4.6–6.5)

## 2014-06-20 MED ORDER — AMPHETAMINE-DEXTROAMPHET ER 20 MG PO CP24: 20.0000 mg | ORAL_CAPSULE | Freq: Every day | ORAL | Status: DC

## 2014-06-20 MED ORDER — TRIAMCINOLONE ACETONIDE 0.1 % EX CREA: TOPICAL_CREAM | Freq: Two times a day (BID) | CUTANEOUS | Status: DC | PRN

## 2014-06-20 MED ORDER — ALMOTRIPTAN MALATE 12.5 MG PO TABS: 12.5000 mg | ORAL_TABLET | ORAL | Status: DC | PRN

## 2014-06-20 MED ORDER — ALPRAZOLAM 0.5 MG PO TABS: 0.5000 mg | ORAL_TABLET | Freq: Three times a day (TID) | ORAL | Status: DC | PRN

## 2014-06-20 NOTE — Assessment & Plan Note (Signed)
The current medical regimen is effective;  continue present plan and medications.  Reviewed weight trends and adverse effects on metabolic syndrome -  Intol of Victoza trial Summer 2015 due to rash - on solo metformin Consult by endo early 2014, but plans to follow with PCP Follow a1c q3-70mo, microalb and lipids  Lab Results  Component Value Date   HGBA1C 7.5* 01/19/2014

## 2014-06-20 NOTE — Assessment & Plan Note (Signed)
MRI brain July 2014 without evidence for MS Reports onset of bilateral lower extremity symptoms in September, recurrent late October. Also associated with "corset-like" pain sensation mid thorax to hips during October episode Emergency room evaluation November 2 for same reviewed Check sedimentation rate and ANA Consider follow-up with neurology given history of migraines and atypical symptoms

## 2014-06-20 NOTE — Progress Notes (Signed)
Pre visit review using our clinic review tool, if applicable. No additional management support is needed unless otherwise documented below in the visit note. 

## 2014-06-20 NOTE — Assessment & Plan Note (Signed)
MRI brain 09/2010 and 02/2013 unremarkable-  started on topamax for prevention 10/2010 -  01/2011 titrate up dose - improved  Continue as needed triptan (change Relpax to Axert per formulary, prior [place] less effective) for acute event as needed, refill today

## 2014-06-20 NOTE — Progress Notes (Signed)
Subjective:    Patient ID: Lisa Duran, female    DOB: [birth year], 33 y.o.   MRN: [id number]  HPI  Patient is here for follow up  Reviewed chronic medical issues and interval medical events  Past Medical History  Diagnosis Date  . Obesity, unspecified   . MIGRAINE, COMMON   . OVARIAN CYST   . ALLERGIC RHINITIS   . Anemia   . ANXIETY   . DIABETES MELLITUS, TYPE II   . ADHD (attention deficit hyperactivity disorder)     Review of Systems  Constitutional: Positive for fatigue. Negative for fever and unexpected weight change.  Respiratory: Negative for cough and shortness of breath.   Cardiovascular: Negative for chest pain and leg swelling.  Neurological: Positive for numbness (episodic BLE 04/2014 and 05/2014 - not present currently). Negative for dizziness, seizures, facial asymmetry, speech difficulty and weakness. Headaches: chronic migraine pattern w/o change.       Objective:   Physical Exam  BP 108/80 mmHg  Pulse 112  Temp(Src) 98.4 F (36.9 C) (Oral)  Ht 5\' 4"  (1.626 m)  Wt 209 lb 2 oz (94.858 kg)  BMI 35.88 kg/m2  SpO2 98%  LMP 05/22/2014 Wt Readings from Last 3 Encounters:  06/20/14 209 lb 2 oz (94.858 kg)  06/12/14 210 lb (95.255 kg)  06/12/14 210 lb 6.4 oz (95.437 kg)   Constitutional: She is obese, appears well-developed and well-nourished. No distress.  Neck: Thick, Normal range of motion. Neck supple. No JVD present. No thyromegaly present.  Cardiovascular: Normal rate, regular rhythm and normal heart sounds.  No murmur heard. No BLE edema. Pulmonary/Chest: Effort normal and breath sounds normal. No respiratory distress. She has no wheezes.  Neuro: awake, alert, oriented 4. Cranial nerves II through XII symmetrically intact. Speech fluent, recall normal. Gait and balance normal, strength 5 out of 5 lateral lower extremities including hip flexors Psychiatric: She has a normal mood and affect. Her behavior is normal. Judgment and thought content normal.     Lab Results  Component Value Date   WBC 9.0 06/12/2014   HGB 11.5* 06/12/2014   HCT 36.5 06/12/2014   PLT 518* 06/12/2014   GLUCOSE 167* 06/12/2014   CHOL 200 01/19/2014   TRIG 102.0 01/19/2014   HDL 67.50 01/19/2014   LDLCALC 112* 01/19/2014   ALT 17 06/12/2014   AST 17 06/12/2014   NA 139 06/12/2014   K 3.6* 06/12/2014   CL 102 06/12/2014   CREATININE 0.80 06/12/2014   BUN 5* 06/12/2014   CO2 20 06/12/2014   TSH 1.86 01/11/2013   HGBA1C 7.5* 01/19/2014   MICROALBUR 0.2 01/19/2014    Dg Chest 2 View  06/12/2014   CLINICAL DATA:  One day history of sternal chest pain and tightness  EXAM: CHEST  2 VIEW  COMPARISON:  None.  FINDINGS: Lungs are clear. The heart size and pulmonary vascularity are normal. No pneumothorax. No adenopathy. No bone lesions.  IMPRESSION: No abnormality noted.   Electronically Signed   By: Lowella Grip M.D.   On: 06/12/2014 13:13       Assessment & Plan:   Problem List Items Addressed This Visit    Diabetes mellitus type 2 with neurological manifestations    The current medical regimen is effective;  continue present plan and medications.  Reviewed weight trends and adverse effects on metabolic syndrome -  Intol of Victoza trial Summer 2015 due to rash - on solo metformin Consult by endo early 2014, but plans to follow  with PCP Follow a1c q3-76mo, microalb and lipids  Lab Results  Component Value Date   HGBA1C 7.5* 01/19/2014      Relevant Orders      Hemoglobin A1c   Migraine without aura    MRI brain 09/2010 and 02/2013 unremarkable-  started on topamax for prevention 10/2010 -  01/2011 titrate up dose - improved  Continue as needed triptan (change Relpax to Axert per formulary, prior [place] less effective) for acute event as needed, refill today     Relevant Medications      almotriptan (AXERT) tablet   Paresthesias - Primary    MRI brain July 2014 without evidence for MS Reports onset of bilateral lower extremity symptoms in  September, recurrent late October. Also associated with "corset-like" pain sensation mid thorax to hips during October episode Emergency room evaluation November 2 for same reviewed Check sedimentation rate and ANA Consider follow-up with neurology given history of migraines and atypical symptoms    Relevant Orders      Sedimentation rate      ANA

## 2014-06-20 NOTE — Patient Instructions (Signed)
It was good to see you today.  We have reviewed your prior records including labs and tests today  Test(s) ordered today. Your results will be released to [place] (or called to you) after review, usually within 72hours after test completion. If any changes need to be made, you will be notified at that same time.  Medications reviewed and updated Change Relpax to Axert for migraine symptoms as needed, no other changes recommended at this time. Your prescription(s)/refills have been submitted to your pharmacy. Please take as directed and contact our office if you believe you are having problem(s) with the medication(s).  Refill on medication(s) as discussed today.  Please schedule followup in 6 months, call sooner if problems.

## 2014-06-21 LAB — ANA: ANA: NEGATIVE

## 2014-06-26 ENCOUNTER — Other Ambulatory Visit: Payer: Self-pay | Admitting: Family

## 2014-06-26 ENCOUNTER — Ambulatory Visit: Payer: BC Managed Care – PPO | Admitting: Family

## 2014-06-26 ENCOUNTER — Encounter: Payer: Self-pay | Admitting: Internal Medicine

## 2014-06-26 DIAGNOSIS — R202 Paresthesia of skin: Secondary | ICD-10-CM

## 2014-06-27 ENCOUNTER — Other Ambulatory Visit (INDEPENDENT_AMBULATORY_CARE_PROVIDER_SITE_OTHER): Payer: BC Managed Care – PPO

## 2014-06-27 DIAGNOSIS — R202 Paresthesia of skin: Secondary | ICD-10-CM

## 2014-06-27 LAB — B12 AND FOLATE PANEL
Folate: 5.1 ng/mL — ABNORMAL LOW (ref >5.9)
Vitamin B-12: 252 pg/mL (ref 211–911)

## 2014-06-28 ENCOUNTER — Encounter: Payer: Self-pay | Admitting: Family

## 2014-06-28 LAB — B. BURGDORFI ANTIBODIES: B burgdorferi Ab IgG+IgM: 0.43 {ISR}

## 2014-07-19 ENCOUNTER — Emergency Department (HOSPITAL_COMMUNITY): Payer: BC Managed Care – PPO

## 2014-07-19 ENCOUNTER — Emergency Department (HOSPITAL_COMMUNITY)
Admission: EM | Admit: 2014-07-19 | Discharge: 2014-07-20 | Disposition: A | Discharge status: Home / Self Care | Payer: BC Managed Care – PPO | Attending: Emergency Medicine | Admitting: Emergency Medicine

## 2014-07-19 ENCOUNTER — Encounter (HOSPITAL_COMMUNITY): Payer: Self-pay | Admitting: *Deleted

## 2014-07-19 DIAGNOSIS — Z79899 Other long term (current) drug therapy: Secondary | ICD-10-CM | POA: Insufficient documentation

## 2014-07-19 DIAGNOSIS — G43909 Migraine, unspecified, not intractable, without status migrainosus: Secondary | ICD-10-CM | POA: Diagnosis not present

## 2014-07-19 DIAGNOSIS — R109 Unspecified abdominal pain: Secondary | ICD-10-CM

## 2014-07-19 DIAGNOSIS — M549 Dorsalgia, unspecified: Secondary | ICD-10-CM | POA: Insufficient documentation

## 2014-07-19 DIAGNOSIS — E119 Type 2 diabetes mellitus without complications: Secondary | ICD-10-CM | POA: Diagnosis not present

## 2014-07-19 DIAGNOSIS — R110 Nausea: Secondary | ICD-10-CM | POA: Diagnosis not present

## 2014-07-19 DIAGNOSIS — F419 Anxiety disorder, unspecified: Secondary | ICD-10-CM | POA: Insufficient documentation

## 2014-07-19 DIAGNOSIS — Z7982 Long term (current) use of aspirin: Secondary | ICD-10-CM | POA: Insufficient documentation

## 2014-07-19 DIAGNOSIS — D649 Anemia, unspecified: Secondary | ICD-10-CM | POA: Insufficient documentation

## 2014-07-19 DIAGNOSIS — Z3202 Encounter for pregnancy test, result negative: Secondary | ICD-10-CM | POA: Insufficient documentation

## 2014-07-19 DIAGNOSIS — Z794 Long term (current) use of insulin: Secondary | ICD-10-CM | POA: Diagnosis not present

## 2014-07-19 DIAGNOSIS — E669 Obesity, unspecified: Secondary | ICD-10-CM | POA: Insufficient documentation

## 2014-07-19 DIAGNOSIS — Z8742 Personal history of other diseases of the female genital tract: Secondary | ICD-10-CM | POA: Diagnosis not present

## 2014-07-19 DIAGNOSIS — Z87891 Personal history of nicotine dependence: Secondary | ICD-10-CM | POA: Diagnosis not present

## 2014-07-19 DIAGNOSIS — J309 Allergic rhinitis, unspecified: Secondary | ICD-10-CM | POA: Diagnosis not present

## 2014-07-19 LAB — CBC WITH DIFFERENTIAL/PLATELET
BASOS ABS: 0 10*3/uL (ref 0.0–0.1)
Basophils Relative: 0 % (ref 0–1)
EOS ABS: 0.1 10*3/uL (ref 0.0–0.7)
EOS PCT: 0 % (ref 0–5)
HCT: 35.1 % — ABNORMAL LOW (ref 36.0–46.0)
Hemoglobin: 11.1 g/dL — ABNORMAL LOW (ref 12.0–15.0)
LYMPHS PCT: 8 % — AB (ref 12–46)
Lymphs Abs: 1.4 10*3/uL (ref 0.7–4.0)
MCH: 22.8 pg — ABNORMAL LOW (ref 26.0–34.0)
MCHC: 31.6 g/dL (ref 30.0–36.0)
MCV: 72.2 fL — AB (ref 78.0–100.0)
Monocytes Absolute: 0.9 10*3/uL (ref 0.1–1.0)
Monocytes Relative: 5 % (ref 3–12)
NEUTROS PCT: 87 % — AB (ref 43–77)
Neutro Abs: 15.2 10*3/uL — ABNORMAL HIGH (ref 1.7–7.7)
PLATELETS: 495 10*3/uL — AB (ref 150–400)
RBC: 4.86 MIL/uL (ref 3.87–5.11)
RDW: 16.5 % — AB (ref 11.5–15.5)
WBC: 17.6 10*3/uL — AB (ref 4.0–10.5)

## 2014-07-19 LAB — COMPREHENSIVE METABOLIC PANEL
ALT: 31 U/L (ref 0–35)
AST: 24 U/L (ref 0–37)
Albumin: 4 g/dL (ref 3.5–5.2)
Alkaline Phosphatase: 176 U/L — ABNORMAL HIGH (ref 39–117)
Anion gap: 21 — ABNORMAL HIGH (ref 5–15)
BUN: 11 mg/dL (ref 6–23)
CO2: 17 mEq/L — ABNORMAL LOW (ref 19–32)
Calcium: 9.8 mg/dL (ref 8.4–10.5)
Chloride: 98 mEq/L (ref 96–112)
Creatinine, Ser: 0.83 mg/dL (ref 0.50–1.10)
GFR calc Af Amer: >90 mL/min (ref >90)
GFR calc non Af Amer: >90 mL/min (ref >90)
Glucose, Bld: 238 mg/dL — ABNORMAL HIGH (ref 70–99)
POTASSIUM: 3.8 meq/L (ref 3.7–5.3)
SODIUM: 136 meq/L — AB (ref 137–147)
TOTAL PROTEIN: 8 g/dL (ref 6.0–8.3)
Total Bilirubin: 0.3 mg/dL (ref 0.3–1.2)

## 2014-07-19 LAB — URINE MICROSCOPIC-ADD ON

## 2014-07-19 LAB — URINALYSIS, ROUTINE W REFLEX MICROSCOPIC
GLUCOSE, UA: NEGATIVE mg/dL
Ketones, ur: >80 mg/dL — AB
Nitrite: NEGATIVE
PH: 6 (ref 5.0–8.0)
Protein, ur: NEGATIVE mg/dL
SPECIFIC GRAVITY, URINE: 1.024 (ref 1.005–1.030)
Urobilinogen, UA: 0.2 mg/dL (ref 0.0–1.0)

## 2014-07-19 LAB — LIPASE, BLOOD: LIPASE: 20 U/L (ref 11–59)

## 2014-07-19 LAB — CBG MONITORING, ED: GLUCOSE-CAPILLARY: 161 mg/dL — AB (ref 70–99)

## 2014-07-19 LAB — POC URINE PREG, ED: PREG TEST UR: NEGATIVE

## 2014-07-19 MED ORDER — SODIUM CHLORIDE 0.9 % IV BOLUS (SEPSIS)
1000.0000 mL | INTRAVENOUS | Status: AC
  Administered 2014-07-19: 1000 mL via INTRAVENOUS

## 2014-07-19 MED ORDER — FENTANYL CITRATE 0.05 MG/ML IJ SOLN
50.0000 ug | Freq: Once | INTRAMUSCULAR | Status: AC
  Administered 2014-07-19: 50 ug via INTRAVENOUS
  Filled 2014-07-19: qty 2

## 2014-07-19 MED ORDER — FENTANYL CITRATE 0.05 MG/ML IJ SOLN: 50.0000 ug | Freq: Once | INTRAMUSCULAR | Status: DC

## 2014-07-19 MED ORDER — ONDANSETRON 4 MG PO TBDP
8.0000 mg | ORAL_TABLET | Freq: Once | ORAL | Status: AC
  Administered 2014-07-19: 8 mg via ORAL
  Filled 2014-07-19: qty 2

## 2014-07-19 MED ORDER — CEPHALEXIN 500 MG PO CAPS: 500.0000 mg | ORAL_CAPSULE | Freq: Three times a day (TID) | ORAL | Status: DC

## 2014-07-19 MED ORDER — CEPHALEXIN 250 MG PO CAPS
500.0000 mg | ORAL_CAPSULE | Freq: Once | ORAL | Status: AC
  Administered 2014-07-19: 500 mg via ORAL
  Filled 2014-07-19: qty 2

## 2014-07-19 MED ORDER — HYDROMORPHONE HCL 1 MG/ML IJ SOLN
0.5000 mg | Freq: Once | INTRAMUSCULAR | Status: AC
  Administered 2014-07-19: 0.5 mg via INTRAVENOUS
  Filled 2014-07-19: qty 1

## 2014-07-19 MED ORDER — ONDANSETRON 4 MG PO TBDP: ORAL_TABLET | ORAL | Status: DC

## 2014-07-19 MED ORDER — FENTANYL CITRATE 0.05 MG/ML IJ SOLN
INTRAMUSCULAR | Status: AC
  Administered 2014-07-19: 50 ug
  Filled 2014-07-19: qty 2

## 2014-07-19 MED ORDER — OXYCODONE-ACETAMINOPHEN 5-325 MG PO TABS: 1.0000 | ORAL_TABLET | Freq: Four times a day (QID) | ORAL | Status: DC | PRN

## 2014-07-19 NOTE — ED Provider Notes (Addendum)
CSN: [id number]     Arrival date & time 07/19/14  5056 History   First MD Initiated Contact with Patient 07/19/14 1925     Chief Complaint  Patient presents with  . Abdominal Pain  . Back Pain     (Consider location/radiation/quality/duration/timing/severity/associated sxs/prior Treatment) Patient is a 33 y.o. female presenting with abdominal pain and back pain. The history is provided by the patient.  Abdominal Pain Pain location:  R flank Pain quality: sharp   Pain radiation: radiates to right groin. Pain severity:  Moderate Onset quality:  Sudden Duration:  5 hours Timing:  Constant Progression:  Partially resolved Chronicity:  New Context comment:  While at work Relieved by:  Nothing Worsened by:  Nothing tried Ineffective treatments:  None tried Associated symptoms: nausea   Associated symptoms: no chest pain, no cough, no diarrhea, no dysuria, no fatigue, no fever, no hematuria, no shortness of breath and no vomiting   Back Pain Associated symptoms: abdominal pain   Associated symptoms: no chest pain, no dysuria, no fever and no headaches     Past Medical History  Diagnosis Date  . Obesity, unspecified   . MIGRAINE, COMMON   . OVARIAN CYST   . ALLERGIC RHINITIS   . Anemia   . ANXIETY   . DIABETES MELLITUS, TYPE II   . ADHD (attention deficit hyperactivity disorder)    Past Surgical History  Procedure Laterality Date  . No past surgeries     Family History  Problem Relation Age of Onset  . Diabetes Mother   . Hyperlipidemia Mother   . Diabetes Father   . Hyperlipidemia Father   . Coronary artery disease Father   . Hypertension Father   . Cancer Father     esoph  . Cancer Maternal Grandfather 18    lung, met   History  Substance Use Topics  . Smoking status: Former Smoker    Quit date: 08/20/2002  . Smokeless tobacco: Never Used     Comment: single, separated from spouse 02/2010.   [name] Alcohol Use: Yes     Comment: occasionally   OB History    No data available     Review of Systems  Constitutional: Negative for fever and fatigue.  HENT: Negative for congestion and drooling.   Eyes: Negative for pain.  Respiratory: Negative for cough and shortness of breath.   Cardiovascular: Negative for chest pain.  Gastrointestinal: Positive for nausea and abdominal pain. Negative for vomiting and diarrhea.  Genitourinary: Negative for dysuria and hematuria.  Musculoskeletal: Negative for back pain, gait problem and neck pain.  Skin: Negative for color change.  Neurological: Negative for dizziness and headaches.  Hematological: Negative for adenopathy.  Psychiatric/Behavioral: Negative for behavioral problems.  All other systems reviewed and are negative.     Allergies  Chicken allergy; Other; and Victoza  Home Medications   Prior to Admission medications   Medication Sig Start Date End Date Taking? Authorizing Provider  almotriptan (AXERT) 12.5 MG tablet Take 1 tablet (12.5 mg total) by mouth as needed for migraine. may repeat in 2 hours if needed 06/20/14  Yes Rowe Clack, MD  ALPRAZolam Duanne Moron) 0.5 MG tablet Take 1 tablet (0.5 mg total) by mouth 3 (three) times daily as needed for anxiety. 06/20/14  Yes Rowe Clack, MD  amphetamine-dextroamphetamine (ADDERALL XR) 20 MG 24 hr capsule Take 1 capsule (20 mg total) by mouth daily. 06/20/14  Yes Rowe Clack, MD  aspirin 81 MG tablet Take 81  mg by mouth daily.     Yes Historical Provider, MD  Cholecalciferol (VITAMIN D) 2000 UNITS CAPS Take 2,000 Units by mouth daily.    Yes Historical Provider, MD  citalopram (CELEXA) 20 MG tablet TAKE 1 TABLET DAILY 06/05/14  Yes Rowe Clack, MD  ferrous sulfate (IRON SUPPLEMENT) 325 (65 FE) MG tablet Take 325 mg by mouth daily with breakfast.     Yes Historical Provider, MD  lamoTRIgine (LAMICTAL) 100 MG tablet TAKE 1 TABLET TWICE A DAY 06/07/14  Yes Rowe Clack, MD  levonorgestrel (MIRENA) 20 MCG/24HR IUD 1 Intra  Uterine Device (1 each total) by Intrauterine route once. 10/03/13  Yes Rowe Clack, MD  metFORMIN (GLUCOPHAGE) 500 MG tablet TAKE 1 TABLET TWICE A DAY WITH MEALS 01/14/14  Yes Rowe Clack, MD  promethazine (PHENERGAN) 25 MG tablet Take 25 mg by mouth every 6 (six) hours as needed for nausea. 01/27/11  Yes Rowe Clack, MD  topiramate (TOPAMAX) 25 MG tablet TAKE 2 TABLETS TWICE A DAY 03/13/14  Yes Rowe Clack, MD  traMADol-acetaminophen (ULTRACET) 37.5-325 MG per tablet Take 1 tablet by mouth every 6 (six) hours as needed for pain. 01/26/12  Yes Rowe Clack, MD  triamcinolone cream (KENALOG) 0.1 % Apply topically 2 (two) times daily as needed. 06/20/14  Yes Rowe Clack, MD  EPINEPHrine (EPIPEN) 0.3 mg/0.3 mL IJ SOAJ injection Inject 0.3 mLs (0.3 mg total) into the muscle once. 01/16/14   Rowe Clack, MD  glucose blood (TRUETEST TEST) test strip Use as instructed 01/11/13   Rowe Clack, MD  Insulin Pen Needle (BD PEN NEEDLE NANO U/F) 32G X 4 MM MISC USE 1 PEN NEEDLE TO ADMINISTER VICTOZA EVERY MORNING 05/08/14   Rowe Clack, MD   BP 96/64 mmHg  Pulse 103  Temp(Src) 97.6 F (36.4 C) (Oral)  Resp 16  Ht '5\' 4"'  (1.626 m)  Wt 200 lb (90.719 kg)  BMI 34.31 kg/m2  SpO2 96%  LMP 07/16/2014 Physical Exam  Constitutional: She is oriented to person, place, and time. She appears well-developed and well-nourished.  HENT:  Head: Normocephalic.  Mouth/Throat: Oropharynx is clear and moist. No oropharyngeal exudate.  Eyes: Conjunctivae and EOM are normal. Pupils are equal, round, and reactive to light.  Neck: Normal range of motion. Neck supple.  Cardiovascular: Normal rate, regular rhythm, normal heart sounds and intact distal pulses.  Exam reveals no gallop and no friction rub.   No murmur heard. Pulmonary/Chest: Effort normal and breath sounds normal. No respiratory distress. She has no wheezes.  Abdominal: Soft. Bowel sounds are normal. There is no  tenderness. There is no rebound and no guarding.  Mild non-spec right flank ttp.   Musculoskeletal: Normal range of motion. She exhibits no edema or tenderness.  Neurological: She is alert and oriented to person, place, and time.  Skin: Skin is warm and dry.  Psychiatric: She has a normal mood and affect. Her behavior is normal.  Nursing note and vitals reviewed.   ED Course  Procedures (including critical care time) Labs Review Labs Reviewed  CBC WITH DIFFERENTIAL - Abnormal; Notable for the following:    WBC 17.6 (*)    Hemoglobin 11.1 (*)    HCT 35.1 (*)    MCV 72.2 (*)    MCH 22.8 (*)    RDW 16.5 (*)    Platelets 495 (*)    Neutrophils Relative % 87 (*)    Neutro Abs 15.2 (*)  Lymphocytes Relative 8 (*)    All other components within normal limits  COMPREHENSIVE METABOLIC PANEL - Abnormal; Notable for the following:    Sodium 136 (*)    CO2 17 (*)    Glucose, Bld 238 (*)    Alkaline Phosphatase 176 (*)    Anion gap 21 (*)    All other components within normal limits  URINALYSIS, ROUTINE W REFLEX MICROSCOPIC - Abnormal; Notable for the following:    APPearance CLOUDY (*)    Hgb urine dipstick LARGE (*)    Bilirubin Urine SMALL (*)    Ketones, ur >80 (*)    Leukocytes, UA SMALL (*)    All other components within normal limits  URINE MICROSCOPIC-ADD ON - Abnormal; Notable for the following:    Squamous Epithelial / LPF FEW (*)    Bacteria, UA MANY (*)    All other components within normal limits  CBG MONITORING, ED - Abnormal; Notable for the following:    Glucose-Capillary 161 (*)    All other components within normal limits  URINE CULTURE  LIPASE, BLOOD  POC URINE PREG, ED    Imaging Review Ct Renal Stone Study  07/19/2014   CLINICAL DATA:  Right flank pain. Right lower quadrant pain. Nausea since yesterday. Microhematuria.  EXAM: CT ABDOMEN AND PELVIS WITHOUT CONTRAST  TECHNIQUE: Multidetector CT imaging of the abdomen and pelvis was performed following the  standard protocol without IV contrast.  COMPARISON:  01/16/2013  FINDINGS: Atelectasis in the lung bases. Small esophageal hiatal hernia. 10 mm diameter lymph node in the abdominal hiatus.  Kidneys are symmetrical in size and shape. No hydronephrosis or hydroureter. No renal, ureteral, or bladder stones identified.  Fatty infiltration of the liver. Unenhanced appearance of gallbladder, spleen, pancreas, adrenal glands, abdominal aorta, inferior vena cava, and retroperitoneal lymph nodes is unremarkable. Small accessory spleen. Stomach and small bowel are decompressed. Stool-filled colon without distention. No free air or free fluid in the abdomen.  Pelvis: Intrauterine device. Uterus and ovaries are not enlarged. Bladder wall is not thickened. Appendix is normal. No free or loculated pelvic fluid collections. No pelvic mass or lymphadenopathy. Degenerative changes in the lumbar spine. No destructive bone lesions.  IMPRESSION: No renal or ureteral stone or obstruction. Small esophageal hiatal hernia. 10 mm diameter lymph node in the abdominal hiatus is unchanged since prior study.   Electronically Signed   By: Lucienne Capers M.D.   On: 07/19/2014 23:16     EKG Interpretation None      MDM   Final diagnoses:  Right flank discomfort    8:57 PM 33 y.o. female w hx of DM who pw sudden onset right flank pain at around 4 PM today. She has had nausea but denies any vomiting. She is afebrile and vital signs are unremarkable here. Suspicious for a kidney stone, will get screening labs and imaging. Fentanyl for pain control.  12:04 AM: Pt continues to appear well. CT neg, possibly passed stone given blood in UA. I did offer to get a pelvic US given the radiation of pain to her right groin. I educated her on ovarian torsion, but felt this was unlikely given the almost complete resolution of her pain and minimal ttp. She would like to forego US imaging tonight and understands that I cannot completely rule this  out w/out an [place]. Will cover for UTI.  I have discussed the diagnosis/risks/treatment options with the patient and family and believe the pt to be eligible for discharge home  to follow-up with her pcp as needed. We also discussed returning to the ED immediately if new or worsening sx occur. We discussed the sx which are most concerning (e.g., worsening pain, fever) that necessitate immediate return. Medications administered to the patient during their visit and any new prescriptions provided to the patient are listed below.  Medications given during this visit Medications  fentaNYL (SUBLIMAZE) injection 50 mcg (not administered)  ondansetron (ZOFRAN-ODT) disintegrating tablet 8 mg (8 mg Oral Given 07/19/14 1909)  fentaNYL (SUBLIMAZE) 0.05 MG/ML injection (50 mcg  Given 07/19/14 1910)  fentaNYL (SUBLIMAZE) injection 50 mcg (50 mcg Intravenous Given 07/19/14 2044)  sodium chloride 0.9 % bolus 1,000 mL (0 mLs Intravenous Stopped 07/19/14 2237)  HYDROmorphone (DILAUDID) injection 0.5 mg (0.5 mg Intravenous Given 07/19/14 2339)  cephALEXin (KEFLEX) capsule 500 mg (500 mg Oral Given 07/19/14 2338)    New Prescriptions   CEPHALEXIN (KEFLEX) 500 MG CAPSULE    Take 1 capsule (500 mg total) by mouth 3 (three) times daily.   ONDANSETRON (ZOFRAN ODT) 4 MG DISINTEGRATING TABLET    71m ODT q4 hours prn nausea/vomit   OXYCODONE-ACETAMINOPHEN (PERCOCET) 5-325 MG PER TABLET    Take 1 tablet by mouth every 6 (six) hours as needed for moderate pain.       FPamella Pert MD 07/20/14 07998 FPamella Pert MD 07/20/14 [id number]

## 2014-07-19 NOTE — ED Notes (Signed)
Pt reports onset at 4:15 of severe sharp stabbing pain to right side of back and into right lower abd. Denies any urinary symptoms. Having nausea, denies vomiting or diarrhea.

## 2014-07-20 ENCOUNTER — Ambulatory Visit: Payer: BC Managed Care – PPO | Admitting: Internal Medicine

## 2014-07-20 ENCOUNTER — Ambulatory Visit (INDEPENDENT_AMBULATORY_CARE_PROVIDER_SITE_OTHER): Payer: BC Managed Care – PPO | Admitting: Internal Medicine

## 2014-07-20 ENCOUNTER — Encounter: Payer: Self-pay | Admitting: Internal Medicine

## 2014-07-20 VITALS — BP 100/66 | HR 82 | Temp 98.0°F | Resp 13 | Wt 214.1 lb

## 2014-07-20 DIAGNOSIS — R312 Other microscopic hematuria: Secondary | ICD-10-CM

## 2014-07-20 DIAGNOSIS — R3129 Other microscopic hematuria: Secondary | ICD-10-CM

## 2014-07-20 DIAGNOSIS — R1011 Right upper quadrant pain: Secondary | ICD-10-CM

## 2014-07-20 DIAGNOSIS — R109 Unspecified abdominal pain: Secondary | ICD-10-CM

## 2014-07-20 DIAGNOSIS — R1031 Right lower quadrant pain: Secondary | ICD-10-CM

## 2014-07-20 DIAGNOSIS — R10A1 Flank pain, right side: Secondary | ICD-10-CM

## 2014-07-20 DIAGNOSIS — R112 Nausea with vomiting, unspecified: Secondary | ICD-10-CM

## 2014-07-20 MED ORDER — CIPROFLOXACIN HCL 500 MG PO TABS: 500.0000 mg | ORAL_TABLET | Freq: Two times a day (BID) | ORAL | Status: DC

## 2014-07-20 MED ORDER — KETOROLAC TROMETHAMINE 30 MG/ML IM SOLN: 30.0000 mg | Freq: Once | INTRAMUSCULAR | Status: DC

## 2014-07-20 NOTE — Progress Notes (Signed)
Pre visit review using our clinic review tool, if applicable. No additional management support is needed unless otherwise documented below in the visit note. 

## 2014-07-20 NOTE — Progress Notes (Signed)
   Subjective:    Patient ID: Lisa Duran, female    DOB: 02/18/1981, 33 y.o.   MRN: [id number]  HPI   She began to have acute right flank pain @ 4:15 PM 07/19/14. It radiated anteriorly to the right lower quadrant and into the right medial thigh.  She was seen in the emergency room ;those records were reviewed.  Urine showed many bacteria , 21-50 red cells & 3-6 white cells. Culture is pending  CAT scan revealed no renal or ureteral stone or obstruction. There was a 10 mm diameter lymph node in the abdominal hiatus which has been seen on prior studies 01/16/13 . It also showed atelectasis @ the lung bases and a small esophageal hiatal hernia. Fatty infiltration the liver was suggested. A small accessory spleen was noted .IUD in place.  Random glucoses 238. Alk phosphatase was elevated at 176.  White count was significantly elevated at 17,600. She had mild anemia with hemoglobin 11.1 and hematocrit 35.1.  She was discharged on Keflex, Zofran, and Percocet.  She did get pain relief with medications in the ER  and went home and slept from 1 AM to 7 AM. The pain recurred today at 11 AM and has progressed  despite taking Percocet twice She's had nausea and vomiting once.  The pain is now constant  She describes feeling hot without definitive fever.  She is having oliguria as well.  Review of Systems   She denies dysuria or pyuria ; she has not visualized hematuria.   She's had no melena, rectal bleeding,constipation or diarrhea.    Objective:   Physical Exam   Pertinent or positive findings include: She is obviously very uncomfortable but in no acute distress. Bowel sounds are markedly decreased She has marked tenderness in the right upper quadrant, right lower quadrant, suprapubic area She has pain to light percussion over the right flank.  There is dullness to percussion in the right upper quadrant.  Rectal exam reveals an empty vault with no stool for testing. There is no  adnexal tenderness.  General appearance :adequately nourished Eyes: No conjunctival inflammation or scleral icterus is present. Oral exam: Dental hygiene is good. Lips and gums are healthy appearing.There is no oropharyngeal erythema or exudate noted.  Heart:  Normal rate and regular rhythm. S1 and S2 normal without gallop, murmur, click, rub or other extra sounds  Lungs:Chest clear to auscultation; no wheezes, rhonchi,rales ,or rubs present.No increased work of breathing.  Vascular : all pulses equal ; no bruits present. Skin:Warm & dry.  Intact without suspicious lesions or rashes ; no jaundice or tenting Lymphatic: No lymphadenopathy is noted about the head, neck, axilla             Assessment & Plan:  #1 right flank/abdominal pain  #2 microscopic hematuria and white cells in urine  #3 nausea & vomiting  Plan: She'll stay on clear liquids  Metformin to be held  She'll be placed on Cipro pending the results of urine culture  If she is unable to get adequate pain relief and control of her nausea and vomiting; unfortunately she will need to go back to the emergency room.  She'll be given Toradol here and a family member will drive her home & pick her car later. 

## 2014-07-20 NOTE — ED Notes (Signed)
Wasted 0.5mg  of dilaudid with Woody,RN. Waste placed in sharps.

## 2014-07-20 NOTE — Patient Instructions (Addendum)
Stay on clear liquids for 48-72 hours or until nausea & vomiting and pain are resolved.This would include  jello, sherbert (NOT ice cream), beef bullion (NOT cream based soups),Gatorade Lite, flat Ginger ale (without High Fructose Corn Syrup),dry toast or crackers, baked potato.No milk , dairy or grease until well. Hold Metformin until well.

## 2014-07-21 LAB — URINE CULTURE
Colony Count: NO GROWTH
Culture: NO GROWTH

## 2014-07-24 ENCOUNTER — Encounter: Payer: Self-pay | Admitting: Internal Medicine

## 2014-08-08 NOTE — Telephone Encounter (Signed)
Pt called back. Stated that she is feeling much better.

## 2014-08-08 NOTE — Telephone Encounter (Signed)
LVM for pt to call back.   RE: checking on how the pt is doing since last visits.

## 2014-11-07 ENCOUNTER — Encounter: Payer: Self-pay | Admitting: Internal Medicine

## 2014-11-08 MED ORDER — SITAGLIPTIN PHOSPHATE 100 MG PO TABS: 100.0000 mg | ORAL_TABLET | Freq: Every day | ORAL | Status: DC

## 2014-11-09 ENCOUNTER — Telehealth: Payer: Self-pay

## 2014-11-10 NOTE — Telephone Encounter (Signed)
Not sure why encounter is open. Closing encounter.../lmb  A user error has taken place:

## 2014-11-27 ENCOUNTER — Ambulatory Visit (INDEPENDENT_AMBULATORY_CARE_PROVIDER_SITE_OTHER): Payer: BLUE CROSS/BLUE SHIELD | Admitting: Licensed Clinical Social Worker

## 2014-11-27 DIAGNOSIS — F332 Major depressive disorder, recurrent severe without psychotic features: Secondary | ICD-10-CM | POA: Diagnosis not present

## 2014-12-11 ENCOUNTER — Ambulatory Visit (INDEPENDENT_AMBULATORY_CARE_PROVIDER_SITE_OTHER): Payer: BLUE CROSS/BLUE SHIELD | Admitting: Licensed Clinical Social Worker

## 2014-12-11 DIAGNOSIS — F3181 Bipolar II disorder: Secondary | ICD-10-CM | POA: Diagnosis not present

## 2014-12-19 ENCOUNTER — Telehealth: Payer: Self-pay | Admitting: Internal Medicine

## 2014-12-19 ENCOUNTER — Other Ambulatory Visit (INDEPENDENT_AMBULATORY_CARE_PROVIDER_SITE_OTHER): Payer: BLUE CROSS/BLUE SHIELD

## 2014-12-19 ENCOUNTER — Encounter: Payer: Self-pay | Admitting: Internal Medicine

## 2014-12-19 ENCOUNTER — Ambulatory Visit: Payer: BC Managed Care – PPO | Admitting: Internal Medicine

## 2014-12-19 ENCOUNTER — Ambulatory Visit (INDEPENDENT_AMBULATORY_CARE_PROVIDER_SITE_OTHER): Payer: BLUE CROSS/BLUE SHIELD | Admitting: Internal Medicine

## 2014-12-19 ENCOUNTER — Ambulatory Visit: Payer: Self-pay | Admitting: Internal Medicine

## 2014-12-19 VITALS — BP 118/88 | HR 120 | Temp 99.0°F | Resp 16 | Ht 64.0 in | Wt 220.1 lb

## 2014-12-19 DIAGNOSIS — E1165 Type 2 diabetes mellitus with hyperglycemia: Secondary | ICD-10-CM

## 2014-12-19 DIAGNOSIS — R21 Rash and other nonspecific skin eruption: Secondary | ICD-10-CM | POA: Diagnosis not present

## 2014-12-19 DIAGNOSIS — E1149 Type 2 diabetes mellitus with other diabetic neurological complication: Secondary | ICD-10-CM

## 2014-12-19 DIAGNOSIS — IMO0002 Reserved for concepts with insufficient information to code with codable children: Secondary | ICD-10-CM

## 2014-12-19 DIAGNOSIS — E1140 Type 2 diabetes mellitus with diabetic neuropathy, unspecified: Secondary | ICD-10-CM | POA: Diagnosis not present

## 2014-12-19 DIAGNOSIS — G43009 Migraine without aura, not intractable, without status migrainosus: Secondary | ICD-10-CM | POA: Diagnosis not present

## 2014-12-19 LAB — COMPREHENSIVE METABOLIC PANEL
ALBUMIN: 4.2 g/dL (ref 3.5–5.2)
ALT: 35 U/L (ref 0–35)
AST: 27 U/L (ref 0–37)
Alkaline Phosphatase: 160 U/L — ABNORMAL HIGH (ref 39–117)
BUN: 8 mg/dL (ref 6–23)
CHLORIDE: 99 meq/L (ref 96–112)
CO2: 26 mEq/L (ref 19–32)
Calcium: 9.6 mg/dL (ref 8.4–10.5)
Creatinine, Ser: 0.73 mg/dL (ref 0.40–1.20)
GFR: 95.77 mL/min (ref >60.00)
GLUCOSE: 170 mg/dL — AB (ref 70–99)
POTASSIUM: 3.5 meq/L (ref 3.5–5.1)
Sodium: 134 mEq/L — ABNORMAL LOW (ref 135–145)
Total Bilirubin: 0.3 mg/dL (ref 0.2–1.2)
Total Protein: 8 g/dL (ref 6.0–8.3)

## 2014-12-19 LAB — HEMOGLOBIN A1C: HEMOGLOBIN A1C: 9.4 % — AB (ref 4.6–6.5)

## 2014-12-19 LAB — MICROALBUMIN / CREATININE URINE RATIO
Creatinine,U: 91.4 mg/dL
Microalb Creat Ratio: 0.8 mg/g (ref 0.0–30.0)
Microalb, Ur: <0.7 mg/dL (ref 0.0–1.9)

## 2014-12-19 MED ORDER — AMPHETAMINE-DEXTROAMPHET ER 20 MG PO CP24: 20.0000 mg | ORAL_CAPSULE | Freq: Every day | ORAL | Status: DC

## 2014-12-19 MED ORDER — ALPRAZOLAM 0.5 MG PO TABS: 0.5000 mg | ORAL_TABLET | Freq: Three times a day (TID) | ORAL | Status: DC | PRN

## 2014-12-19 MED ORDER — GLUCOSE BLOOD VI STRP: ORAL_STRIP | Status: DC

## 2014-12-19 MED ORDER — METFORMIN HCL 1000 MG PO TABS: 500.0000 mg | ORAL_TABLET | Freq: Two times a day (BID) | ORAL | Status: DC

## 2014-12-19 MED ORDER — ALMOTRIPTAN MALATE 12.5 MG PO TABS: 12.5000 mg | ORAL_TABLET | ORAL | Status: DC | PRN

## 2014-12-19 NOTE — Progress Notes (Signed)
Pre visit review using our clinic review tool, if applicable. No additional management support is needed unless otherwise documented below in the visit note. 

## 2014-12-19 NOTE — Patient Instructions (Signed)
We have sent in the new prescription to your mail order pharmacy for the metformin 1000 mg pills. Until you get the new pills you can take 2 pill in the morning and 1 pill at night for 1 week then increase to 2 pills in the morning and 2 pills at night.   It is okay to stay off the topamax as long as you are doing well with the migraines.   Use the kenalog cream for 1 week on the rash on the hand to see if it goes away. It does not look like fungus. It is okay to stop taking the iron pills since you are on the pre-natal vitamin.   We will check on the blood work today and call you back with the results.   We would like you to return in about 3 months to check on the sugars.

## 2014-12-19 NOTE — Telephone Encounter (Signed)
Patient would like to know if you follow patients taking adderall.  This is a Dr. Asa Lente Patient looking to transfer.  Was going to transfer to [facility] but Dr. Doug Sou will not follow patients taking adderall.

## 2014-12-20 ENCOUNTER — Other Ambulatory Visit: Payer: Self-pay | Admitting: Internal Medicine

## 2014-12-20 MED ORDER — CANAGLIFLOZIN 100 MG PO TABS: 100.0000 mg | ORAL_TABLET | Freq: Every day | ORAL | Status: DC

## 2014-12-22 NOTE — Assessment & Plan Note (Signed)
She can use the steroid cream she has at home for the rash which looks allergic in nature. Advised her to treat the area for 1 week for clearance. If no resolution she will call back.

## 2014-12-22 NOTE — Assessment & Plan Note (Signed)
Has stopped taking her topamax and for now is doing okay and not having many migraines. She is working to avoid triggers that she knows about.

## 2014-12-22 NOTE — Assessment & Plan Note (Signed)
Checking HgA1c, last one was above goal. Also checking microalbumin to creatinine ratio and CMP. Due to her other concerns was only able to talk with her briefly and we talked about increasing her metformin to 1000 mg bid and possibly adding another agent if her HgA1c is >9. She is also taking [place].

## 2014-12-22 NOTE — Progress Notes (Signed)
   Subjective:    Patient ID: Lisa Duran, female    DOB: 20-Aug-1980, 34 y.o.   MRN: [id number]  HPI The patient is a 34 YO female who is coming in for several concerns. She has a new rash on her right hand. Started 1-2 weeks ago and has not gotten better. It is itchy and she is trying not to scratch. No known exposure to new or allergic substances. Not doing any outdoor work. No change in soap or detergent. Her next complaint is her migraines. She has taken herself off the topamax as she is tired of taking so many medicines. She previously was having migraines 4-5 times per month off topamax. Now she has been off for about 3 weeks and only 1 migraine. She has been monitoring her diet and has eliminated some triggers. Tries to avoid taking OTC medications if she can. She did taper herself off slowly and did not call any office for advice.   Review of Systems  Constitutional: Negative for fever, activity change, appetite change, fatigue and unexpected weight change.  Respiratory: Negative.   Cardiovascular: Negative.   Gastrointestinal: Negative.   Musculoskeletal: Negative.   Skin: Positive for rash. Negative for color change, pallor and wound.  Neurological: Negative for dizziness, weakness, numbness and headaches.  Psychiatric/Behavioral: Negative.       Objective:   Physical Exam  Constitutional: She appears well-developed and well-nourished.  HENT:  Head: Normocephalic and atraumatic.  Eyes: EOM are normal.  Neck: Normal range of motion.  Cardiovascular: Normal rate and regular rhythm.   Pulmonary/Chest: Effort normal. No respiratory distress. She has no wheezes. She has no rales.  Abdominal: Soft. She exhibits no distension. There is no tenderness.  Skin: Skin is warm and dry. Rash noted.  Small bumps on the right hand in between the digits, not much redness around the bumps and no signs of excessive scratching or skin breakdown.    Filed Vitals:   12/19/14 1401  BP: 118/88    Pulse: 120  Temp: 99 F (37.2 C)  TempSrc: Oral  Resp: 16  Height: 5\' 4"  (1.626 m)  Weight: 220 lb 1.9 oz (99.846 kg)  SpO2: 98%      Assessment & Plan:

## 2014-12-25 ENCOUNTER — Ambulatory Visit: Payer: BLUE CROSS/BLUE SHIELD | Admitting: Licensed Clinical Social Worker

## 2014-12-25 ENCOUNTER — Other Ambulatory Visit: Payer: Self-pay | Admitting: Internal Medicine

## 2014-12-27 ENCOUNTER — Encounter: Payer: Self-pay | Admitting: Internal Medicine

## 2015-01-12 ENCOUNTER — Ambulatory Visit (INDEPENDENT_AMBULATORY_CARE_PROVIDER_SITE_OTHER): Payer: BLUE CROSS/BLUE SHIELD | Admitting: Family

## 2015-01-12 ENCOUNTER — Encounter: Payer: Self-pay | Admitting: Family

## 2015-01-12 VITALS — BP 112/76 | HR 130 | Temp 98.1°F | Resp 18 | Ht 64.0 in | Wt 218.4 lb

## 2015-01-12 DIAGNOSIS — L237 Allergic contact dermatitis due to plants, except food: Secondary | ICD-10-CM | POA: Diagnosis not present

## 2015-01-12 MED ORDER — PREDNISONE 10 MG PO TABS
10.0000 mg | ORAL_TABLET | Freq: Every day | ORAL | Status: DC
Start: 2015-01-12 — End: 2015-01-24

## 2015-01-12 NOTE — Progress Notes (Signed)
Subjective:    Patient ID: Lisa Duran, female    DOB: 09/15/1980, 34 y.o.   MRN: [id number]  Chief Complaint  Patient presents with  . Poison Ivy    got poison ivy and went to her NP at her job and was given a steriod taper, it helped alot but it all started flaring back up last night, itches all over her body and has rashes at her ankles, it itches worse than before    HPI:  Lisa Duran is a 34 y.o. female with a PMH of Type 2 diabetes, anxiety, ADHD,  who presents today for an acute office visit.   Associated symptom of a rash located on your legs, arms, chest, back and scalp has been going on for a 3 days. She was recently treated with a course of prednisone for 6 days which initially helped the rash and once stopped had a rebound rash develop. Modifying factors include calamine and Benadryl which have helped minimally.    Allergies  Allergen Reactions  . Chicken Allergy   . Other     Any Nuts   . Victoza [Liraglutide] Rash    Current Outpatient Prescriptions on File Prior to Visit  Medication Sig Dispense Refill  . almotriptan (AXERT) 12.5 MG tablet Take 1 tablet (12.5 mg total) by mouth as needed for migraine. may repeat in 2 hours if needed 12 tablet 3  . ALPRAZolam (XANAX) 0.5 MG tablet Take 1 tablet (0.5 mg total) by mouth 3 (three) times daily as needed for anxiety. 30 tablet 1  . amphetamine-dextroamphetamine (ADDERALL XR) 20 MG 24 hr capsule Take 1 capsule (20 mg total) by mouth daily. 30 capsule 0  . aspirin 81 MG tablet Take 81 mg by mouth daily.      . canagliflozin (INVOKANA) 100 MG TABS tablet Take 1 tablet (100 mg total) by mouth daily. 30 tablet 6  . Cholecalciferol (VITAMIN D) 2000 UNITS CAPS Take 2,000 Units by mouth daily.     . citalopram (CELEXA) 20 MG tablet TAKE 1 TABLET DAILY 90 tablet 2  . EPINEPHrine (EPIPEN) 0.3 mg/0.3 mL IJ SOAJ injection Inject 0.3 mLs (0.3 mg total) into the muscle once. 1 Device 1  . fluticasone (FLONASE) 50 MCG/ACT nasal spray  Place 2 sprays into both nostrils daily.   1  . glucose blood (TRUETEST TEST) test strip Use as instructed 100 each 3  . lamoTRIgine (LAMICTAL) 100 MG tablet TAKE 1 TABLET TWICE A DAY 180 tablet 0  . levonorgestrel (MIRENA) 20 MCG/24HR IUD 1 Intra Uterine Device (1 each total) by Intrauterine route once. 1 each 0  . metFORMIN (GLUCOPHAGE) 1000 MG tablet Take 0.5 tablets (500 mg total) by mouth 2 (two) times daily with a meal. 180 tablet 3  . metFORMIN (GLUCOPHAGE) 500 MG tablet Take 1 tablet (500 mg total) by mouth 2 (two) times daily with a meal. 180 tablet 3  . Prenatal Vit-Fe Fumarate-FA (MULTIVITAMIN-PRENATAL) 27-0.8 MG TABS tablet Take 1 tablet by mouth daily at 12 noon.    . promethazine (PHENERGAN) 25 MG tablet Take 25 mg by mouth every 6 (six) hours as needed for nausea.    . sitaGLIPtin (JANUVIA) 100 MG tablet Take 1 tablet (100 mg total) by mouth daily. 30 tablet 5  . topiramate (TOPAMAX) 25 MG tablet TAKE 2 TABLETS TWICE A DAY 120 tablet 0  . traMADol-acetaminophen (ULTRACET) 37.5-325 MG per tablet Take 1 tablet by mouth every 6 (six) hours as needed for pain.    [name]  triamcinolone cream (KENALOG) 0.1 % Apply topically 2 (two) times daily as needed. 30 g 1   No current facility-administered medications on file prior to visit.    Review of Systems  Constitutional: Negative for fever and chills.  Skin: Positive for rash.      Objective:    BP 112/76 mmHg  Pulse 130  Temp(Src) 98.1 F (36.7 C) (Oral)  Resp 18  Ht 5\' 4"  (1.626 m)  Wt 218 lb 6.4 oz (99.066 kg)  BMI 37.47 kg/m2  SpO2 97% Nursing note and vital signs reviewed.  Physical Exam  Constitutional: She is oriented to person, place, and time. She appears well-developed and well-nourished. No distress.  Cardiovascular: Normal rate, regular rhythm, normal heart sounds and intact distal pulses.   Pulmonary/Chest: Effort normal and breath sounds normal.  Neurological: She is alert and oriented to person, place, and time.    Skin: Skin is warm and dry.  Generalized maculopapular rash located on her arms, legs, stomach, back, and chest.   Psychiatric: She has a normal mood and affect. Her behavior is normal. Judgment and thought content normal.       Assessment & Plan:   Problem List Items Addressed This Visit      Musculoskeletal and Integument   Poison ivy dermatitis - Primary    Symptoms and exam consistent with rebound poison ivy dermatitis from previous prednisone taper. Restart prednisone taper 12 days. Continue over-the-counter creams and lotions as needed for symptom relief and supportive care. Follow-up if symptoms worsen or fail to improve.      Relevant Medications   predniSONE (DELTASONE) 10 MG tablet

## 2015-01-12 NOTE — Progress Notes (Signed)
Pre visit review using our clinic review tool, if applicable. No additional management support is needed unless otherwise documented below in the visit note. 

## 2015-01-12 NOTE — Assessment & Plan Note (Signed)
Symptoms and exam consistent with rebound poison ivy dermatitis from previous prednisone taper. Restart prednisone taper 12 days. Continue over-the-counter creams and lotions as needed for symptom relief and supportive care. Follow-up if symptoms worsen or fail to improve.

## 2015-01-12 NOTE — Patient Instructions (Signed)
12 Day Prednisone Taper Instructions   Days 1-4: Two tablets before breakfast, one after lunch, one after dinner, and two at bedtime.  Days 5-8: One tablet before breakfast, one after lunch, one after dinner, and one at bedtime  Days 9-12: One tablet before breakfast and one at bedtime   Poison [facility] ivy is a inflammation of the skin (contact dermatitis) caused by touching the allergens on the leaves of the ivy plant following previous exposure to the plant. The rash usually appears 48 hours after exposure. The rash is usually bumps (papules) or blisters (vesicles) in a linear pattern. Depending on your own sensitivity, the rash may simply cause redness and itching, or it may also progress to blisters which may break open. These must be well cared for to prevent secondary bacterial (germ) infection, followed by scarring. Keep any open areas dry, clean, dressed, and covered with an antibacterial ointment if needed. The eyes may also get puffy. The puffiness is worst in the morning and gets better as the day progresses. This dermatitis usually heals without scarring, within 2 to 3 weeks without treatment. HOME CARE INSTRUCTIONS  Thoroughly wash with soap and water as soon as you have been exposed to poison ivy. You have about one half hour to remove the plant resin before it will cause the rash. This washing will destroy the oil or antigen on the skin that is causing, or will cause, the rash. Be sure to wash under your fingernails as any plant resin there will continue to spread the rash. Do not rub skin vigorously when washing affected area. Poison ivy cannot spread if no oil from the plant remains on your body. A rash that has progressed to weeping sores will not spread the rash unless you have not washed thoroughly. It is also important to wash any clothes you have been wearing as these may carry active allergens. The rash will return if you wear the unwashed clothing, even several days  later. Avoidance of the plant in the future is the best measure. Poison ivy plant can be recognized by the number of leaves. Generally, poison ivy has three leaves with flowering branches on a single stem. Diphenhydramine may be purchased over the counter and used as needed for itching. Do not drive with this medication if it makes you drowsy.Ask your caregiver about medication for children. SEEK MEDICAL CARE IF:  Open sores develop.  Redness spreads beyond area of rash.  You notice purulent (pus-like) discharge.  You have increased pain.  Other signs of infection develop (such as fever). Document Released: 07/25/2000 Document Revised: 10/20/2011 Document Reviewed: 01/05/2009 [facility] Patient Information 2015 [place], [place]. This information is not intended to replace advice given to you by your health care provider. Make sure you discuss any questions you have with your health care provider.

## 2015-01-19 ENCOUNTER — Ambulatory Visit (INDEPENDENT_AMBULATORY_CARE_PROVIDER_SITE_OTHER): Payer: BLUE CROSS/BLUE SHIELD | Admitting: Internal Medicine

## 2015-01-19 ENCOUNTER — Encounter: Payer: Self-pay | Admitting: Internal Medicine

## 2015-01-19 VITALS — BP 112/70 | HR 104 | Temp 97.7°F | Resp 12 | Ht 64.0 in | Wt 215.0 lb

## 2015-01-19 DIAGNOSIS — E1149 Type 2 diabetes mellitus with other diabetic neurological complication: Secondary | ICD-10-CM

## 2015-01-19 DIAGNOSIS — E1140 Type 2 diabetes mellitus with diabetic neuropathy, unspecified: Secondary | ICD-10-CM | POA: Diagnosis not present

## 2015-01-19 MED ORDER — METFORMIN HCL 1000 MG PO TABS: 1000.0000 mg | ORAL_TABLET | Freq: Two times a day (BID) | ORAL | Status: DC

## 2015-01-19 NOTE — Progress Notes (Signed)
Subjective:     Patient ID: Lisa Duran, female   DOB: 06-04-1981, 34 y.o.   MRN: [id number]  HPI Ms Lerner is a pleasant 34 y.o. woman initially referred by PCP, Dr. Asa Lente, for DM2, non-insulin dep., uncontrolled, dx 05/2009. Last visit 08/2012.  She has poison ivy >> now on high dose steroids for this (taper).   Gained 35 lbs since last visit!  Last HbA1c: Lab Results  Component Value Date   HGBA1C 9.4* 12/19/2014   HGBA1C 7.6* 06/20/2014   HGBA1C 7.5* 01/19/2014   She has been initially started on Metformin, at the maximum dose, but then decreased to 500 mg bid due to good control. She was also on Actos, but taken off b/c good control. In the last months, HbA1c trended up, though. PCP suggested Invokana >> pt did not start yet as she wanted to talk to me first.  She is on: - Metformin 1000 mg bid Tried Victoza 2 mo ago >> AP, severe GERD, inj site rxn Tried Januvia >> tolerated it well, but stopped when Invokana was suggested  She checks sugars 2x a day: - am: 90-120 >> 180-200, 300 (steroids) - 2h post dinner: 180-190 >> 200s Hypoglycemia awareness at lower 80's.  Lowest sugars: 70's (delayed a meal). No lows at night. Highest sugar: 300's  She is doing yoga 3x a week.  No CKD. Lab Results  Component Value Date   BUN 8 12/19/2014   Lab Results  Component Value Date   CREATININE 0.73 12/19/2014   No HL Lab Results  Component Value Date   CHOL 200 01/19/2014   HDL 67.50 01/19/2014   LDLCALC 112* 01/19/2014   TRIG 102.0 01/19/2014   CHOLHDL 3 01/19/2014   Last eye exam 11/2014 - [place] [facility] in [facility]. No DR.  Has numbness and tingling in hands and feet.  Past Medical History  Diagnosis Date  . Obesity, unspecified   . MIGRAINE, COMMON   . OVARIAN CYST   . ALLERGIC RHINITIS   . Anemia   . ANXIETY   . DIABETES MELLITUS, TYPE II   . ADHD (attention deficit hyperactivity disorder)    Past Surgical History  Procedure Laterality Date  . No past  surgeries     History   Social History  . Marital Status: Married    Spouse Name: N/A    Number of Children: 0  . Years of Education: N/A   Occupational History  . Estate agent   Social History Main Topics  . Smoking status: Was smoking 1/2 PPD, quit 2009  . Smokeless tobacco: No     Comment: single, separated from spouse 02/2010.   [name] Alcohol Use: Yes, mixed, 2-4 x mo, 1-2 drinks at a time  . Drug Use: No   Social History Narrative   Single, separated from spouse 02/2010   Current Outpatient Prescriptions on File Prior to Visit  Medication Sig Dispense Refill  . almotriptan (AXERT) 12.5 MG tablet Take 1 tablet (12.5 mg total) by mouth as needed for migraine. may repeat in 2 hours if needed 12 tablet 3  . ALPRAZolam (XANAX) 0.5 MG tablet Take 1 tablet (0.5 mg total) by mouth 3 (three) times daily as needed for anxiety. 30 tablet 1  . amphetamine-dextroamphetamine (ADDERALL XR) 20 MG 24 hr capsule Take 1 capsule (20 mg total) by mouth daily. 30 capsule 0  . aspirin 81 MG tablet Take 81 mg by mouth daily.      . Cholecalciferol (  VITAMIN D) 2000 UNITS CAPS Take 2,000 Units by mouth daily.     . citalopram (CELEXA) 20 MG tablet TAKE 1 TABLET DAILY 90 tablet 2  . EPINEPHrine (EPIPEN) 0.3 mg/0.3 mL IJ SOAJ injection Inject 0.3 mLs (0.3 mg total) into the muscle once. 1 Device 1  . fluticasone (FLONASE) 50 MCG/ACT nasal spray Place 2 sprays into both nostrils daily.   1  . glucose blood (TRUETEST TEST) test strip Use as instructed 100 each 3  . lamoTRIgine (LAMICTAL) 100 MG tablet TAKE 1 TABLET TWICE A DAY 180 tablet 0  . levonorgestrel (MIRENA) 20 MCG/24HR IUD 1 Intra Uterine Device (1 each total) by Intrauterine route once. 1 each 0  . metFORMIN (GLUCOPHAGE) 1000 MG tablet Take 0.5 tablets (500 mg total) by mouth 2 (two) times daily with a meal. 180 tablet 3  . metFORMIN (GLUCOPHAGE) 500 MG tablet Take 1 tablet (500 mg total) by mouth 2 (two) times daily with a meal. 180  tablet 3  . predniSONE (DELTASONE) 10 MG tablet Take 1 tablet (10 mg total) by mouth daily with breakfast. 48 tablet 0  . Prenatal Vit-Fe Fumarate-FA (MULTIVITAMIN-PRENATAL) 27-0.8 MG TABS tablet Take 1 tablet by mouth daily at 12 noon.    . promethazine (PHENERGAN) 25 MG tablet Take 25 mg by mouth every 6 (six) hours as needed for nausea.    . sitaGLIPtin (JANUVIA) 100 MG tablet Take 1 tablet (100 mg total) by mouth daily. 30 tablet 5  . topiramate (TOPAMAX) 25 MG tablet TAKE 2 TABLETS TWICE A DAY 120 tablet 0  . traMADol-acetaminophen (ULTRACET) 37.5-325 MG per tablet Take 1 tablet by mouth every 6 (six) hours as needed for pain.    [name] triamcinolone cream (KENALOG) 0.1 % Apply topically 2 (two) times daily as needed. 30 g 1  . canagliflozin (INVOKANA) 100 MG TABS tablet Take 1 tablet (100 mg total) by mouth daily. (Patient not taking: Reported on 01/19/2015) 30 tablet 6   No current facility-administered medications on file prior to visit.   Allergies  Allergen Reactions  . Chicken Allergy   . Gluten Meal Diarrhea  . Other     Any Nuts   . Victoza [Liraglutide] Rash    Family History  Problem Relation Age of Onset  . Diabetes Mother   . Hyperlipidemia Mother   . Diabetes Father   . Hyperlipidemia Father   . Coronary artery disease Father   . Hypertension Father   . Cancer Father     esoph  . Cancer Maternal Grandfather 50    lung, met   Review of Systems Constitutional: + weight gain, + fatigue, + subjective hyperthermia/hypothermia, + poor sleep Eyes: no blurry vision, no xerophthalmia ENT: no sore throat, no nodules palpated in throat, no dysphagia/odynophagia, no hoarseness Cardiovascular: + CP/no SOB/palpitations/ only occasionally leg swelling Respiratory: no cough/SOB Gastrointestinal: no N/V/D/ but lately has constipation. Has heartburn. Musculoskeletal: + muscle/+ joint aches Skin: + rash (poison ivy legs), + itching Neurological: no tremors/dizziness, no HAs     Objective:   Physical Exam BP 112/70 mmHg  Pulse 104  Temp(Src) 97.7 F (36.5 C) (Oral)  Resp 12  Ht '5\' 4"'  (1.626 m)  Wt 215 lb (97.523 kg)  BMI 36.89 kg/m2  SpO2 95% LMP 08/08/2012 Wt Readings from Last 3 Encounters:  01/19/15 215 lb (97.523 kg)  01/12/15 218 lb 6.4 oz (99.066 kg)  12/19/14 220 lb 1.9 oz (99.846 kg)   Constitutional: overweight, in NAD Eyes: PERRLA, EOMI, no  exophthalmos ENT: moist mucous membranes, no thyromegaly, no cervical lymphadenopathy Cardiovascular: RRR, No MRG Respiratory: CTA B Gastrointestinal: abdomen soft, NT, ND, BS+ Musculoskeletal: no deformities, strength intact in all 4 Skin: moist, warm, rosacea-like rash on upper chest Neurological: no tremor with outstretched hands, DTR normal in all 4  Assessment:     1. DM2, non-insulin-dependent, uncontrolled, without complications - r/o autoimmunity Component     Latest Ref Rng 08/17/2012  Glutamic Acid Decarb Ab     <=1.0 U/mL <1.0  Pancreatic Islet Cell Antibody     < 5 JDF Units <5  C-Peptide     0.80 - 3.90 ng/mL 1.84  Glucose     70 - 99 mg/dL 104 (H)     Plan:     1. Patient with long-standing, now more uncontrolled diabetes, on oral antidiabetic regimen with Metformin only, which became insufficient. Sugars in the 200s >> will start Invokana (this may help with weight loss, also. We may need Januvia at next visit, also. - We discussed about options for treatment, and I suggested to:  Patient Instructions  Please continue Metformin 1000 mg 2x a day with meals. Start Invokana 100 mg in am.  Please return in 2 month with your sugar log.   Check sugars 2x a day, rotating check times.  - continue checking sugars at different times of the day - check 2 times a day, rotating checks - given a new sugar log and advised how to fill it and to bring it at next appt  - we discussed about SEs of Invokana, which are: dizziness (advised to be careful when stands from sitting position),  decreased BP - usually not < normal (BP today is not low), and fungal UTIs (advised to let me know if develops one).  - given discount card for Invokana - UTD with yearly eye exams - will check Hba1c at next visit. Reviewed the last HbA1c >> higher. - Return to clinic in 2 mo with sugar log

## 2015-01-19 NOTE — Patient Instructions (Signed)
Please continue Metformin 1000 mg 2x a day with meals. Start Invokana 100 mg in am.  Please return in 2 month with your sugar log.   Check sugars 2x a day, rotating check times.

## 2015-01-22 ENCOUNTER — Ambulatory Visit (INDEPENDENT_AMBULATORY_CARE_PROVIDER_SITE_OTHER): Payer: BLUE CROSS/BLUE SHIELD | Admitting: Licensed Clinical Social Worker

## 2015-01-22 ENCOUNTER — Telehealth: Payer: Self-pay | Admitting: Internal Medicine

## 2015-01-22 DIAGNOSIS — L237 Allergic contact dermatitis due to plants, except food: Secondary | ICD-10-CM

## 2015-01-22 DIAGNOSIS — F3181 Bipolar II disorder: Secondary | ICD-10-CM

## 2015-01-22 NOTE — Telephone Encounter (Signed)
Patient states she seen Marya Amsler for poison ivy and was prescribed prednisone.  Patient states pharmacy, [facility] on [address] only gave her a quantity of 30 instead of the 95.  Patient states she had not been on prednisone since Thursday and is broken out all over again.  Patient is over at [facility] and they have called over b/c patient is stressed over this.  Is requesting a call be given to patient in regards as soon as possible.

## 2015-01-22 NOTE — Telephone Encounter (Signed)
Called with no answer. Will try again in am.

## 2015-01-23 ENCOUNTER — Telehealth: Payer: Self-pay | Admitting: *Deleted

## 2015-01-23 NOTE — Telephone Encounter (Signed)
[facility] Night - Client TELEPHONE [facility] Patient Name: Lisa Duran Gender: Female DOB: Aug 05, 1981 Age: 34 Y 3 M 15 D Return Phone Number: Address: City/State/Zip: [place] [employer] [facility] - Client Client Site [place] - Night Physician [place], [place] Type Call Call Type Triage / Clinical Caller Name Judson Roch Relationship To Patient Other Return Phone Number Please choose phone number Chief Complaint Paging or Request for Consult Initial Comment Caller needs a call back for directions on rx Caller is Judson Roch- with [facility] [place] CB# [phone] Nurse Assessment Guidelines Guideline Title Affirmed Question Affirmed Notes Nurse Date/Time ([place] Time) Disp. Time Eilene Ghazi Time) Disposition Final User 01/19/2015 5:48:15 PM Pharmacy Call Wynetta Emery, RN, [facility] Reason: Nurse called Judson Roch at [facility] to find out what she needed for this RX: 1 week ago prednisone 10 #48 one daily -- they filled it for 30 days and now they questioning if it was supposed a dose pak. if so, should stop medication. 01/19/2015 5:49:45 PM Paged On Call back to [facility], [place] 01/19/2015 5:50:19 PM Send To RN [place], RN, [facility] 01/19/2015 5:50:51 PM Clinical Call Wynetta Emery, RN, [facility] 01/19/2015 5:51:12 PM Send To RN Personal Wynetta Emery, RN, [facility] 01/19/2015 5:52:24 PM Send To RN Personal Wynetta Emery, RN, [facility] 01/19/2015 6:11:04 PM Pharmacy Call Wynetta Emery, RN, [facility] Reason: MD wants her to continue at 10mg  one daily until Monday until they can get it clarified. Md will try to get in system later. 01/19/2015 6:15:11 PM Call Completed Wynetta Emery RN, [facility] 01/19/2015 5:51:46 PM Clinical Call Yes Wynetta Emery, RN, [facility] After Care Instructions Given Call Event Type User Date / Time Description PLEASE NOTE: All timestamps contained within this report are represented as [place] Standard Time. CONFIDENTIALTY NOTICE: This  fax transmission is intended only for the addressee. It contains information that is legally privileged, confidential or otherwise protected from use or disclosure. If you are not the intended recipient, you are strictly prohibited from reviewing, disclosing, copying using or disseminating any of this information or taking any action in reliance on or regarding this information. If you have received this fax in error, please notify us immediately by telephone so that we can arrange for its return to [place]. Phone: [phone], Toll-Free: [phone], Fax: [phone] Page: 2 of 2 Call Id: [id number] Paging DoctorName Phone DateTime Result/Outcome Message Type Notes Carolann Littler [phone] 01/19/2015 5:49:45 PM Paged On Call Back to Call Center Doctor Paged please call Baker Janus RN at [phone] KD Carolann Littler 01/19/2015 6:14:51 PM Spoke with On Call - Outcome Notification Message Result Dr. Elease Hashimoto called back and given the information; wants the patient to continue on medication until it can be clarified -- pharmacy called and advised but they state she is out of medication so nurse advised to wait until we hear back from Dr. Elease Hashimoto.

## 2015-01-23 NOTE — Telephone Encounter (Signed)
Patient has called back in regards  °

## 2015-01-24 ENCOUNTER — Telehealth: Payer: Self-pay | Admitting: Internal Medicine

## 2015-01-24 MED ORDER — FLUCONAZOLE 150 MG PO TABS
150.0000 mg | ORAL_TABLET | Freq: Once | ORAL | Status: DC
Start: 2015-01-24 — End: 2015-06-22

## 2015-01-24 MED ORDER — PREDNISONE 10 MG PO TABS: ORAL_TABLET | ORAL | Status: DC

## 2015-01-24 NOTE — Telephone Encounter (Signed)
invokana has caused a bad yeast infection

## 2015-01-24 NOTE — Telephone Encounter (Signed)
150 mg tablet Diflucan okay

## 2015-01-24 NOTE — Addendum Note (Signed)
Addended by: Rockie Neighbours B on: 01/24/2015 11:54 AM   Modules accepted: Orders

## 2015-01-24 NOTE — Telephone Encounter (Signed)
Called pt and advised her, take one today, if no better in 1 week, take the second one. Advised pt if the sx did not clear up, please call back. Pt voiced understanding.

## 2015-01-24 NOTE — Telephone Encounter (Signed)
Spoke with patient regarding her current situation and the pharmacy provided a partial fill indicating they only received prescription for 30 prednisone. Treatment was discontinued prematurely secondary to partial fill and patient continues to experience symptoms of rash. Restart prednisone taper. Follow up if symptoms worsen or do not improve.

## 2015-01-24 NOTE — Telephone Encounter (Signed)
Please read message below and advise if ok to send an rx for Diflucan 150 mg?

## 2015-02-15 ENCOUNTER — Encounter: Payer: Self-pay | Admitting: Internal Medicine

## 2015-02-15 MED ORDER — AMPHETAMINE-DEXTROAMPHET ER 20 MG PO CP24: 20.0000 mg | ORAL_CAPSULE | Freq: Every day | ORAL | Status: DC

## 2015-02-15 NOTE — Telephone Encounter (Signed)
Filled in Dr. Katheren Puller place.

## 2015-03-22 ENCOUNTER — Encounter: Payer: Self-pay | Admitting: Internal Medicine

## 2015-03-22 ENCOUNTER — Ambulatory Visit (INDEPENDENT_AMBULATORY_CARE_PROVIDER_SITE_OTHER): Payer: BLUE CROSS/BLUE SHIELD | Admitting: Internal Medicine

## 2015-03-22 ENCOUNTER — Other Ambulatory Visit (INDEPENDENT_AMBULATORY_CARE_PROVIDER_SITE_OTHER): Payer: BLUE CROSS/BLUE SHIELD | Admitting: *Deleted

## 2015-03-22 ENCOUNTER — Other Ambulatory Visit: Payer: Self-pay

## 2015-03-22 VITALS — BP 118/74 | HR 104 | Temp 98.0°F | Resp 12 | Wt 211.0 lb

## 2015-03-22 DIAGNOSIS — E1140 Type 2 diabetes mellitus with diabetic neuropathy, unspecified: Secondary | ICD-10-CM

## 2015-03-22 DIAGNOSIS — E1149 Type 2 diabetes mellitus with other diabetic neurological complication: Secondary | ICD-10-CM

## 2015-03-22 LAB — POCT GLYCOSYLATED HEMOGLOBIN (HGB A1C): Hemoglobin A1C: 9.8

## 2015-03-22 MED ORDER — CITALOPRAM HYDROBROMIDE 20 MG PO TABS: 20.0000 mg | ORAL_TABLET | Freq: Every day | ORAL | Status: DC

## 2015-03-22 MED ORDER — INSULIN GLARGINE 100 UNIT/ML SOLOSTAR PEN: 10.0000 [IU] | PEN_INJECTOR | Freq: Every day | SUBCUTANEOUS | Status: DC

## 2015-03-22 MED ORDER — INSULIN PEN NEEDLE 32G X 4 MM MISC: Status: DC

## 2015-03-22 NOTE — Patient Instructions (Signed)
Please continue: - Metformin 1000 mg 2x a day  Start: - Lantus 10 units daily at bedtime. If sugars in the morning are >130, increase the dose by 2 units every 3-4 days.   Please return in 1-1.5 months with your sugar log.

## 2015-03-22 NOTE — Progress Notes (Signed)
Subjective:     Patient ID: Lisa Duran, female   DOB: Jan 08, 1981, 34 y.o.   MRN: [id number]  HPI Lisa Duran is a pleasant 34 y.o. woman initially referred by PCP, Dr. Asa Lente, for DM2, non-insulin dep., uncontrolled, dx 05/2009. Last visit 3 mo ago.  Last HbA1c: Lab Results  Component Value Date   HGBA1C 9.4* 12/19/2014   HGBA1C 7.6* 06/20/2014   HGBA1C 7.5* 01/19/2014   She has been initially started on Metformin, at the maximum dose, but then decreased to 500 mg bid due to good control. She was also on Actos, but taken off b/c good control. In the last months, HbA1c trended up, though. PCP suggested Invokana >> pt did not start yet as she wanted to talk to me first.  She is on: - Metformin 1000 mg bid She was on Invokana 100 mg in am (started 12/2014) >> 2 yeast inf >> tx with Diflucan; she was exhausted and had nocturia >> stopped 03/12/2015 Tried Victoza 2 mo ago >> AP, severe GERD, inj site rxn Tried Januvia >> tolerated it well, but stopped when Invokana was suggested  She checks sugars 2x a day: - am: 90-120 >> 180-200, 300 (steroids) >> 159-198, 224 - before lunch: 119-172 - 2h after lunch: 144, 158-261 - before dinner: 118-202 - 2h post dinner: 180-190 >> 200s >> 176-201 Hypoglycemia awareness at lower 80's.  Lowest sugars: 70's (delayed a meal). No lows at night. Highest sugar: 300's  She is doing yoga 3x a week.  No CKD. Lab Results  Component Value Date   BUN 8 12/19/2014   Lab Results  Component Value Date   CREATININE 0.73 12/19/2014   No HL Lab Results  Component Value Date   CHOL 200 01/19/2014   HDL 67.50 01/19/2014   LDLCALC 112* 01/19/2014   TRIG 102.0 01/19/2014   CHOLHDL 3 01/19/2014   Last eye exam 11/2014 - [place] [facility] in [facility]. No DR.  Has numbness and tingling in hands and feet.  I reviewed pt's medications, allergies, PMH, social hx, family hx, and changes were documented in the history of present illness. Otherwise, unchanged  from my initial visit note:  Past Medical History  Diagnosis Date  . Obesity, unspecified   . MIGRAINE, COMMON   . OVARIAN CYST   . ALLERGIC RHINITIS   . Anemia   . ANXIETY   . DIABETES MELLITUS, TYPE II   . ADHD (attention deficit hyperactivity disorder)    Past Surgical History  Procedure Laterality Date  . No past surgeries     History   Social History  . Marital Status: Married    Spouse Name: N/A    Number of Children: 0  . Years of Education: N/A   Occupational History  . Estate agent   Social History Main Topics  . Smoking status: Was smoking 1/2 PPD, quit 2009  . Smokeless tobacco: No     Comment: single, separated from spouse 02/2010.   [name] Alcohol Use: Yes, mixed, 2-4 x mo, 1-2 drinks at a time  . Drug Use: No   Social History Narrative   Single, separated from spouse 02/2010   Current Outpatient Prescriptions on File Prior to Visit  Medication Sig Dispense Refill  . almotriptan (AXERT) 12.5 MG tablet Take 1 tablet (12.5 mg total) by mouth as needed for migraine. may repeat in 2 hours if needed 12 tablet 3  . ALPRAZolam (XANAX) 0.5 MG tablet Take 1 tablet (0.5 mg total)  by mouth 3 (three) times daily as needed for anxiety. 30 tablet 1  . amphetamine-dextroamphetamine (ADDERALL XR) 20 MG 24 hr capsule Take 1 capsule (20 mg total) by mouth daily. 30 capsule 0  . aspirin 81 MG tablet Take 81 mg by mouth daily.      . canagliflozin (INVOKANA) 100 MG TABS tablet Take 1 tablet (100 mg total) by mouth daily. 30 tablet 6  . Cholecalciferol (VITAMIN D) 2000 UNITS CAPS Take 2,000 Units by mouth daily.     . citalopram (CELEXA) 20 MG tablet TAKE 1 TABLET DAILY 90 tablet 2  . EPINEPHrine (EPIPEN) 0.3 mg/0.3 mL IJ SOAJ injection Inject 0.3 mLs (0.3 mg total) into the muscle once. 1 Device 1  . fluconazole (DIFLUCAN) 150 MG tablet Take 1 tablet (150 mg total) by mouth once. 1 tablet 1  . fluticasone (FLONASE) 50 MCG/ACT nasal spray Place 2 sprays into both  nostrils daily.   1  . glucose blood (TRUETEST TEST) test strip Use as instructed 100 each 3  . lamoTRIgine (LAMICTAL) 100 MG tablet TAKE 1 TABLET TWICE A DAY 180 tablet 0  . levonorgestrel (MIRENA) 20 MCG/24HR IUD 1 Intra Uterine Device (1 each total) by Intrauterine route once. 1 each 0  . metFORMIN (GLUCOPHAGE) 1000 MG tablet Take 1 tablet (1,000 mg total) by mouth 2 (two) times daily with a meal. 180 tablet 1  . predniSONE (DELTASONE) 10 MG tablet Take 6 tablets x 4 tablets,  4 tablets x 4 days, and 2 tablets x 4 days 48 tablet 0  . Prenatal Vit-Fe Fumarate-FA (MULTIVITAMIN-PRENATAL) 27-0.8 MG TABS tablet Take 1 tablet by mouth daily at 12 noon.    . promethazine (PHENERGAN) 25 MG tablet Take 25 mg by mouth every 6 (six) hours as needed for nausea.    [name] topiramate (TOPAMAX) 25 MG tablet TAKE 2 TABLETS TWICE A DAY 120 tablet 0  . traMADol-acetaminophen (ULTRACET) 37.5-325 MG per tablet Take 1 tablet by mouth every 6 (six) hours as needed for pain.    [name] triamcinolone cream (KENALOG) 0.1 % Apply topically 2 (two) times daily as needed. 30 g 1   No current facility-administered medications on file prior to visit.   Allergies  Allergen Reactions  . Chicken Allergy   . Gluten Meal Diarrhea  . Other     Any Nuts   . Victoza [Liraglutide] Rash    Family History  Problem Relation Age of Onset  . Diabetes Mother   . Hyperlipidemia Mother   . Diabetes Father   . Hyperlipidemia Father   . Coronary artery disease Father   . Hypertension Father   . Cancer Father     esoph  . Cancer Maternal Grandfather 53    lung, met   Review of Systems Constitutional: no weight gain, + fatigue, + subjective hyperthermia, + poor sleep, + excessive urination Eyes: + blurry vision, no xerophthalmia ENT: no sore throat, no nodules palpated in throat, no dysphagia/odynophagia, no hoarseness Cardiovascular: + CP/no SOB/palpitations/ only occasionally leg swelling Respiratory: no  cough/SOB Gastrointestinal: no N/V/D/+ C/ noheartburn. Musculoskeletal: + muscle/+ joint aches Skin: no rash, no itching Neurological: no tremors/dizziness, no HAs    Objective:   Physical Exam BP 118/74 mmHg  Pulse 104  Temp(Src) 98 F (36.7 C) (Oral)  Resp 12  Wt 211 lb (95.709 kg)  SpO2 97% LMP 08/08/2012 Wt Readings from Last 3 Encounters:  03/22/15 211 lb (95.709 kg)  01/19/15 215 lb (97.523 kg)  01/12/15 218 lb  6.4 oz (99.066 kg)   Constitutional: overweight, in NAD Eyes: PERRLA, EOMI, no exophthalmos ENT: moist mucous membranes, no thyromegaly, no cervical lymphadenopathy Cardiovascular: RRR, No MRG Respiratory: CTA B Gastrointestinal: abdomen soft, NT, ND, BS+ Musculoskeletal: no deformities, strength intact in all 4 Skin: moist, warm, rosacea-like rash on upper chest Neurological: no tremor with outstretched hands, DTR normal in all 4  Assessment:     1. DM2, non-insulin-dependent, uncontrolled, without complications - r/o autoimmunity Component     Latest Ref Rng 08/17/2012  Glutamic Acid Decarb Ab     <=1.0 U/mL <1.0  Pancreatic Islet Cell Antibody     < 5 JDF Units <5  C-Peptide     0.80 - 3.90 ng/mL 1.84  Glucose     70 - 99 mg/dL 104 (H)     Plan:     1. Patient with long-standing, now more uncontrolled diabetes, on oral antidiabetic regimen with Metformin only now, after stopping Invokana b/c SEs. Sugars still in the 200s >> pt agrees to start Insulin >> will start Lantus. Demonstrated insulin pen use, discussed correct inj techniques: When injecting insulin:  Inject in the abdomen  Rotate the injection sites around the belly button  Change needle for each injection  Keep needle in for 10 sec after last unit of insulin in  Keep the insulin in use out of the fridge - I suggested to:  Patient Instructions  Please continue: - Metformin 1000 mg 2x a day  Start: - Lantus 10 units daily at bedtime. If sugars in the morning are >130, increase the  dose by 2 units every 3-4 days.   Please return in 1-1.5 months with your sugar log.   - continue checking sugars at different times of the day - check 2 times a day, rotating checks - UTD with yearly eye exams - will check Hba1c today  - Return to clinic in 1-1.5 mo mo with sugar log   Orders Only on 03/22/2015  Component Date Value Ref Range Status  . Hemoglobin A1C 03/22/2015 9.8   Final  HbA1c is higher! >> see plan above to add insulin.

## 2015-04-30 ENCOUNTER — Encounter: Payer: Self-pay | Admitting: Internal Medicine

## 2015-05-01 MED ORDER — AMPHETAMINE-DEXTROAMPHET ER 20 MG PO CP24: 20.0000 mg | ORAL_CAPSULE | Freq: Every day | ORAL | Status: DC

## 2015-05-01 NOTE — Telephone Encounter (Signed)
Printed and signed, needs visit with PCP scheduled has no future visits.

## 2015-05-07 ENCOUNTER — Ambulatory Visit: Payer: Self-pay | Admitting: Internal Medicine

## 2015-05-14 ENCOUNTER — Other Ambulatory Visit: Payer: Self-pay

## 2015-05-14 MED ORDER — LAMOTRIGINE 100 MG PO TABS: 100.0000 mg | ORAL_TABLET | Freq: Two times a day (BID) | ORAL | Status: DC

## 2015-06-05 ENCOUNTER — Encounter: Payer: Self-pay | Admitting: Internal Medicine

## 2015-06-05 DIAGNOSIS — K219 Gastro-esophageal reflux disease without esophagitis: Secondary | ICD-10-CM

## 2015-06-06 ENCOUNTER — Encounter: Payer: Self-pay | Admitting: Gastroenterology

## 2015-06-07 ENCOUNTER — Ambulatory Visit (INDEPENDENT_AMBULATORY_CARE_PROVIDER_SITE_OTHER): Payer: BLUE CROSS/BLUE SHIELD | Admitting: Internal Medicine

## 2015-06-07 ENCOUNTER — Encounter: Payer: Self-pay | Admitting: Internal Medicine

## 2015-06-07 VITALS — BP 118/68 | HR 125 | Temp 98.2°F | Resp 12 | Wt 213.8 lb

## 2015-06-07 DIAGNOSIS — E1149 Type 2 diabetes mellitus with other diabetic neurological complication: Secondary | ICD-10-CM

## 2015-06-07 MED ORDER — INSULIN GLARGINE 100 UNIT/ML SOLOSTAR PEN: 24.0000 [IU] | PEN_INJECTOR | Freq: Every day | SUBCUTANEOUS | Status: DC

## 2015-06-07 NOTE — Progress Notes (Addendum)
Subjective:     Patient ID: Lisa Duran, female   DOB: May 01, 1981, 34 y.o.   MRN: [id number]  HPI Ms Sachdeva is a pleasant 34 y.o. woman returning for f/u for DM2, insulin dep., uncontrolled, dx 70/1779, w/o complications. Last visit 2.5 mo ago.  Last HbA1c: Lab Results  Component Value Date   HGBA1C 9.8 03/22/2015   HGBA1C 9.4* 12/19/2014   HGBA1C 7.6* 06/20/2014   She has been initially started on Metformin, at the maximum dose, but then decreased to 500 mg bid due to good control. She was also on Actos, but taken off b/c good control. In the last months, HbA1c trended up, though. PCP suggested Invokana >> pt did not start yet as she wanted to talk to me first.  She is on: - Metformin 1000 mg bid - Lantus 24 units added 03/2015 She was on Invokana 100 mg in am (started 12/2014) >> 2 yeast inf >> tx with Diflucan; she was exhausted and had nocturia >> stopped 03/12/2015 Tried Victoza 2 mo ago >> AP, severe GERD, inj site rxn Tried Januvia >> tolerated it well, but stopped when Invokana was suggested  She checks sugars 2x a day >> MUCH better after starting Lantus: - am: 90-120 >> 180-200, 300 (steroids) >> 159-198, 224 >> 110-140, 158 when forgot Lantus - before lunch: 119-172 >> 98, 129 - 2h after lunch: 144, 158-261 >> 86, 104-168 - before dinner: 118-202 >> 119-147, 156 - 2h post dinner: 180-190 >> 200s >> 176-201 >> 141 Hypoglycemia awareness at lower 80's.  Lowest sugars: 70's (delayed a meal) >> 86 . No lows at night. Highest sugar: 300's >> 168  She is doing yoga 3x a week.  No CKD. Lab Results  Component Value Date   BUN 8 12/19/2014   Lab Results  Component Value Date   CREATININE 0.73 12/19/2014   No HL Lab Results  Component Value Date   CHOL 200 01/19/2014   HDL 67.50 01/19/2014   LDLCALC 112* 01/19/2014   TRIG 102.0 01/19/2014   CHOLHDL 3 01/19/2014   Last eye exam 11/2014 - [place] [facility] in [facility]. No DR.  Has numbness and tingling in hands and  feet.  I reviewed pt's medications, allergies, PMH, social hx, family hx, and changes were documented in the history of present illness. Otherwise, unchanged from my initial visit note:  Past Medical History  Diagnosis Date  . Obesity, unspecified   . MIGRAINE, COMMON   . OVARIAN CYST   . ALLERGIC RHINITIS   . Anemia   . ANXIETY   . DIABETES MELLITUS, TYPE II   . ADHD (attention deficit hyperactivity disorder)    Past Surgical History  Procedure Laterality Date  . No past surgeries     History   Social History  . Marital Status: Married    Spouse Name: N/A    Number of Children: 0  . Years of Education: N/A   Occupational History  . Estate agent   Social History Main Topics  . Smoking status: Was smoking 1/2 PPD, quit 2009  . Smokeless tobacco: No     Comment: single, separated from spouse 02/2010.   [name] Alcohol Use: Yes, mixed, 2-4 x mo, 1-2 drinks at a time  . Drug Use: No   Social History Narrative   Single, separated from spouse 02/2010   Current Outpatient Prescriptions on File Prior to Visit  Medication Sig Dispense Refill  . almotriptan (AXERT) 12.5 MG tablet Take 1  tablet (12.5 mg total) by mouth as needed for migraine. may repeat in 2 hours if needed 12 tablet 3  . ALPRAZolam (XANAX) 0.5 MG tablet Take 1 tablet (0.5 mg total) by mouth 3 (three) times daily as needed for anxiety. 30 tablet 1  . amphetamine-dextroamphetamine (ADDERALL XR) 20 MG 24 hr capsule Take 1 capsule (20 mg total) by mouth daily. 30 capsule 0  . aspirin 81 MG tablet Take 81 mg by mouth daily.      . canagliflozin (INVOKANA) 100 MG TABS tablet Take 1 tablet (100 mg total) by mouth daily. 30 tablet 6  . Cholecalciferol (VITAMIN D) 2000 UNITS CAPS Take 2,000 Units by mouth daily.     . citalopram (CELEXA) 20 MG tablet Take 1 tablet (20 mg total) by mouth daily. 90 tablet 2  . EPINEPHrine (EPIPEN) 0.3 mg/0.3 mL IJ SOAJ injection Inject 0.3 mLs (0.3 mg total) into the muscle once. 1  Device 1  . fluticasone (FLONASE) 50 MCG/ACT nasal spray Place 2 sprays into both nostrils daily.   1  . glucose blood (TRUETEST TEST) test strip Use as instructed 100 each 3  . Insulin Glargine (LANTUS SOLOSTAR) 100 UNIT/ML Solostar Pen Inject 10 Units into the skin at bedtime. 5 pen 1  . Insulin Pen Needle 32G X 4 MM MISC Use 1x a day 100 each 1  . lamoTRIgine (LAMICTAL) 100 MG tablet Take 1 tablet (100 mg total) by mouth 2 (two) times daily. 180 tablet 0  . levonorgestrel (MIRENA) 20 MCG/24HR IUD 1 Intra Uterine Device (1 each total) by Intrauterine route once. 1 each 0  . metFORMIN (GLUCOPHAGE) 1000 MG tablet Take 1 tablet (1,000 mg total) by mouth 2 (two) times daily with a meal. 180 tablet 1  . predniSONE (DELTASONE) 10 MG tablet Take 6 tablets x 4 tablets,  4 tablets x 4 days, and 2 tablets x 4 days 48 tablet 0  . Prenatal Vit-Fe Fumarate-FA (MULTIVITAMIN-PRENATAL) 27-0.8 MG TABS tablet Take 1 tablet by mouth daily at 12 noon.    . promethazine (PHENERGAN) 25 MG tablet Take 25 mg by mouth every 6 (six) hours as needed for nausea.    [name] topiramate (TOPAMAX) 25 MG tablet TAKE 2 TABLETS TWICE A DAY 120 tablet 0  . traMADol-acetaminophen (ULTRACET) 37.5-325 MG per tablet Take 1 tablet by mouth every 6 (six) hours as needed for pain.    [name] triamcinolone cream (KENALOG) 0.1 % Apply topically 2 (two) times daily as needed. 30 g 1  . fluconazole (DIFLUCAN) 150 MG tablet Take 1 tablet (150 mg total) by mouth once. (Patient not taking: Reported on 06/07/2015) 1 tablet 1   No current facility-administered medications on file prior to visit.   Allergies  Allergen Reactions  . Chicken Allergy   . Gluten Meal Diarrhea  . Other     Any Nuts   . Victoza [Liraglutide] Rash    Family History  Problem Relation Age of Onset  . Diabetes Mother   . Hyperlipidemia Mother   . Diabetes Father   . Hyperlipidemia Father   . Coronary artery disease Father   . Hypertension Father   . Cancer Father      esoph  . Cancer Maternal Grandfather 91    lung, met   Review of Systems Constitutional: no weight gain, no fatigue, + subjective hyperthermia,  + excessive urination Eyes: no blurry vision, no xerophthalmia ENT: no sore throat, no nodules palpated in throat, no dysphagia/odynophagia, no hoarseness Cardiovascular: no  CP/no SOB/palpitations/ only occasionally leg swelling Respiratory: no cough/SOB Gastrointestinal: + N/no V/D/ C/ + heartburn. Musculoskeletal: no muscle/ joint aches Skin: no rash, no itching, + hair loss Neurological: no tremors/dizziness, no HAs    Objective:   Physical Exam BP 118/68 mmHg  Pulse 125  Temp(Src) 98.2 F (36.8 C) (Oral)  Resp 12  Wt 213 lb 12.8 oz (96.979 kg)  SpO2 96% LMP 08/08/2012 Wt Readings from Last 3 Encounters:  06/07/15 213 lb 12.8 oz (96.979 kg)  03/22/15 211 lb (95.709 kg)  01/19/15 215 lb (97.523 kg)   Constitutional: overweight, in NAD Eyes: PERRLA, EOMI, no exophthalmos ENT: moist mucous membranes, no thyromegaly, no cervical lymphadenopathy Cardiovascular: tachycardia, RR, No MRG Respiratory: CTA B Gastrointestinal: abdomen soft, NT, ND, BS+ Musculoskeletal: no deformities, strength intact in all 4 Skin: moist, warm, rosacea-like rash on upper chest Neurological: no tremor with outstretched hands, DTR normal in all 4  Assessment:     1. DM2, insulin-dependent, uncontrolled, without complications - r/o autoimmunity Component     Latest Ref Rng 08/17/2012  Glutamic Acid Decarb Ab     <=1.0 U/mL <1.0  Pancreatic Islet Cell Antibody     < 5 JDF Units <5  C-Peptide     0.80 - 3.90 ng/mL 1.84  Glucose     70 - 99 mg/dL 104 (H)     2. Tachycardia  Plan:     1. Patient with long-standing, now more uncontrolled diabetes, on oral antidiabetic regimen with Metformin + Lantus started at last visit. Sugars dramatically improved! Will continue current regimen. - I suggested to:  Patient Instructions  Please continue: -  Metformin 1000 mg 2x a day - Lantus 24 units at bedtime  Please return in 2 months with your sugar log.   - continue checking sugars at different times of the day - check 2 times a day, rotating checks - UTD with yearly eye exams - will check Hba1c at next visit - Return to clinic in 2 mo mo with sugar log   2. Tachycardia - need to check TFTs at next visit - last TSH normal in 01/2013

## 2015-06-07 NOTE — Patient Instructions (Signed)
Please continue: - Metformin 1000 mg 2x a day - Lantus 24 units at bedtime  Please return in 2 months with your sugar log.

## 2015-06-13 ENCOUNTER — Encounter: Payer: Self-pay | Admitting: Internal Medicine

## 2015-06-15 ENCOUNTER — Ambulatory Visit (INDEPENDENT_AMBULATORY_CARE_PROVIDER_SITE_OTHER): Payer: BLUE CROSS/BLUE SHIELD

## 2015-06-15 DIAGNOSIS — Z23 Encounter for immunization: Secondary | ICD-10-CM

## 2015-06-19 NOTE — Telephone Encounter (Signed)
Please help schedule patient's return office visit with either of our female internist as requested. Thanks

## 2015-06-21 LAB — HM DIABETES EYE EXAM

## 2015-06-22 ENCOUNTER — Ambulatory Visit (INDEPENDENT_AMBULATORY_CARE_PROVIDER_SITE_OTHER): Payer: BLUE CROSS/BLUE SHIELD | Admitting: Internal Medicine

## 2015-06-22 ENCOUNTER — Encounter: Payer: Self-pay | Admitting: Internal Medicine

## 2015-06-22 VITALS — BP 102/74 | HR 133 | Temp 98.7°F | Resp 16 | Ht 64.0 in | Wt 211.0 lb

## 2015-06-22 DIAGNOSIS — F419 Anxiety disorder, unspecified: Secondary | ICD-10-CM

## 2015-06-22 DIAGNOSIS — F909 Attention-deficit hyperactivity disorder, unspecified type: Secondary | ICD-10-CM | POA: Diagnosis not present

## 2015-06-22 DIAGNOSIS — G43009 Migraine without aura, not intractable, without status migrainosus: Secondary | ICD-10-CM | POA: Diagnosis not present

## 2015-06-22 DIAGNOSIS — F329 Major depressive disorder, single episode, unspecified: Secondary | ICD-10-CM

## 2015-06-22 DIAGNOSIS — F32A Depression, unspecified: Secondary | ICD-10-CM | POA: Insufficient documentation

## 2015-06-22 DIAGNOSIS — R000 Tachycardia, unspecified: Secondary | ICD-10-CM

## 2015-06-22 DIAGNOSIS — L309 Dermatitis, unspecified: Secondary | ICD-10-CM | POA: Insufficient documentation

## 2015-06-22 DIAGNOSIS — E1149 Type 2 diabetes mellitus with other diabetic neurological complication: Secondary | ICD-10-CM

## 2015-06-22 DIAGNOSIS — K219 Gastro-esophageal reflux disease without esophagitis: Secondary | ICD-10-CM

## 2015-06-22 MED ORDER — ALPRAZOLAM 0.5 MG PO TABS: 0.5000 mg | ORAL_TABLET | Freq: Three times a day (TID) | ORAL | Status: DC | PRN

## 2015-06-22 MED ORDER — AMPHETAMINE-DEXTROAMPHET ER 20 MG PO CP24: 20.0000 mg | ORAL_CAPSULE | Freq: Every day | ORAL | Status: DC

## 2015-06-22 MED ORDER — LAMOTRIGINE 100 MG PO TABS: 100.0000 mg | ORAL_TABLET | Freq: Two times a day (BID) | ORAL | Status: DC

## 2015-06-22 NOTE — Patient Instructions (Signed)
We have reviewed your prior records including labs and tests today.  Test(s) ordered today. Your results will be released to [place] (or called to you) after review, usually within 72hours after test completion. If any changes need to be made, you will be notified at that same time.  All other Health Maintenance issues reviewed.   All recommended immunizations and age-appropriate screenings are up-to-date.  No immunizations administered today.   Medications reviewed and updated.  No changes recommended at this time.  Your prescription(s) have been submitted to your pharmacy. Please take as directed and contact our office if you believe you are having problem(s) with the medication(s).  A referral to cardiology  Please schedule followup in 6 months   Gastroesophageal Reflux Disease, Adult Normally, food travels down the esophagus and stays in the stomach to be digested. However, when a person has gastroesophageal reflux disease (GERD), food and stomach acid move back up into the esophagus. When this happens, the esophagus becomes sore and inflamed. Over time, GERD can create small holes (ulcers) in the lining of the esophagus.  CAUSES This condition is caused by a problem with the muscle between the esophagus and the stomach (lower esophageal sphincter, or LES). Normally, the LES muscle closes after food passes through the esophagus to the stomach. When the LES is weakened or abnormal, it does not close properly, and that allows food and stomach acid to go back up into the esophagus. The LES can be weakened by certain dietary substances, medicines, and medical conditions, including:  Tobacco use.  Pregnancy.  Having a hiatal hernia.  Heavy alcohol use.  Certain foods and beverages, such as coffee, chocolate, onions, and peppermint. RISK FACTORS This condition is more likely to develop in:  People who have an increased body weight.  People who have connective tissue  disorders.  People who use NSAID medicines. SYMPTOMS Symptoms of this condition include:  Heartburn.  Difficult or painful swallowing.  The feeling of having a lump in the throat.  Abitter taste in the mouth.  Bad breath.  Having a large amount of saliva.  Having an upset or bloated stomach.  Belching.  Chest pain.  Shortness of breath or wheezing.  Ongoing (chronic) cough or a night-time cough.  Wearing away of tooth enamel.  Weight loss. Different conditions can cause chest pain. Make sure to see your health care provider if you experience chest pain. DIAGNOSIS Your health care provider will take a medical history and perform a physical exam. To determine if you have mild or severe GERD, your health care provider may also monitor how you respond to treatment. You may also have other tests, including:  An endoscopy toexamine your stomach and esophagus with a small camera.  A test thatmeasures the acidity level in your esophagus.  A test thatmeasures how much pressure is on your esophagus.  A barium swallow or modified barium swallow to show the shape, size, and functioning of your esophagus. TREATMENT The goal of treatment is to help relieve your symptoms and to prevent complications. Treatment for this condition may vary depending on how severe your symptoms are. Your health care provider may recommend:  Changes to your diet.  Medicine.  Surgery. HOME CARE INSTRUCTIONS Diet  Follow a diet as recommended by your health care provider. This may involve avoiding foods and drinks such as:  Coffee and tea (with or without caffeine).  Drinks that containalcohol.  Energy drinks and sports drinks.  Carbonated drinks or sodas.  Chocolate and  cocoa.  Peppermint and mint flavorings.  Garlic and onions.  Horseradish.  Spicy and acidic foods, including peppers, chili powder, curry powder, vinegar, hot sauces, and barbecue sauce.  Citrus fruit juices  and citrus fruits, such as oranges, lemons, and limes.  Tomato-based foods, such as red sauce, chili, salsa, and pizza with red sauce.  Fried and fatty foods, such as donuts, french fries, potato chips, and high-fat dressings.  High-fat meats, such as hot dogs and fatty cuts of red and white meats, such as rib eye steak, sausage, ham, and bacon.  High-fat dairy items, such as whole milk, butter, and cream cheese.  Eat small, frequent meals instead of large meals.  Avoid drinking large amounts of liquid with your meals.  Avoid eating meals during the 2-3 hours before bedtime.  Avoid lying down right after you eat.  Do not exercise right after you eat. General Instructions  Pay attention to any changes in your symptoms.  Take over-the-counter and prescription medicines only as told by your health care provider. Do not take aspirin, ibuprofen, or other NSAIDs unless your health care provider told you to do so.  Do not use any tobacco products, including cigarettes, chewing tobacco, and e-cigarettes. If you need help quitting, ask your health care provider.  Wear loose-fitting clothing. Do not wear anything tight around your waist that causes pressure on your abdomen.  Raise (elevate) the head of your bed 6 inches (15cm).  Try to reduce your stress, such as with yoga or meditation. If you need help reducing stress, ask your health care provider.  If you are overweight, reduce your weight to an amount that is healthy for you. Ask your health care provider for guidance about a safe weight loss goal.  Keep all follow-up visits as told by your health care provider. This is important. SEEK MEDICAL CARE IF:  You have new symptoms.  You have unexplained weight loss.  You have difficulty swallowing, or it hurts to swallow.  You have wheezing or a persistent cough.  Your symptoms do not improve with treatment.  You have a hoarse voice. SEEK IMMEDIATE MEDICAL CARE IF:  You have  pain in your arms, neck, jaw, teeth, or back.  You feel sweaty, dizzy, or light-headed.  You have chest pain or shortness of breath.  You vomit and your vomit looks like blood or coffee grounds.  You faint.  Your stool is bloody or black.  You cannot swallow, drink, or eat.   This information is not intended to replace advice given to you by your health care provider. Make sure you discuss any questions you have with your health care provider.   Document Released: 05/07/2005 Document Revised: 04/18/2015 Document Reviewed: 11/22/2014 Elsevier Interactive Patient Education [employer].

## 2015-06-22 NOTE — Progress Notes (Signed)
Subjective:    Patient ID: Lisa Duran, female    DOB: 1981-04-26, 34 y.o.   MRN: [id number]  HPI She is here to establish with a new pcp.   Diabetes: She is following with endocrine.  She is taking her medication daily as prescribed. She is compliant with a diabetic diet. She is exercising regularly - yoga, rowing. She monitors her sugars and they have been running 100-120's. She checks her feet daily and denies foot lesions. She is up-to-date with an ophthalmology examination - done yesterday.   Anxiety, depression:  She occasional goes to counseling.  She has seen psych in the past, but has been stable.  The celexa works well for her.  She takes xanax as needed for panic attacks.  She uses the xanax on about twice a week but as little as once a month.  She saw psych a while ago and lamictal was added for mood stability - ? Bipolar.  She feels good on her current medications.    ADD:  She was diagnosed in 2012 through behavioral health.  She takes adderall during the week only.  She denies side effects.  She feels the medication works well.   Migraine headaches with occasional aura:  She has been on topamax and will be trying to come off of it to decrease her medications.  She denies side effects from the medication.  She is eating gluten free and thinks that has helped her migraines.  Other triggers: fatigue, weather changes, stress.  She takes axert and phenergan as needed for migraines.  She takes ultracet if needed for migraines if the other meds do not work, which is not often. She is not currently following with neuro, but has seen them in the past.   GERD:  Has an appointment with GI.  Has tried several medications without improvement.  She has GERD daily with a bad attacks 1-2 times a week.  She thinks she is compliant with a GERD diet.  Sinus tachycardia: She has an elevated HR intermittently.  She is not sure how often.  She thinks it is related to anxiety.  She did go to the ED not  long ago for chest pain and an EKG showed sinus tachy.  The chest pain was thought to be related to anxiety.  If she feels her heart rate periodically at home it does seem to be elevated.  Medications and allergies reviewed with patient and updated if appropriate.  Patient Active Problem List   Diagnosis Date Noted  . Poison ivy dermatitis 01/12/2015  . Rash and nonspecific skin eruption 12/22/2014  . Myofascial pain 06/16/2014  . Tachycardia 06/16/2014  . Chest pain 06/12/2014  . Paresthesias 06/12/2014  . Food allergy   . Wrist pain, right 09/14/2013  . Elbow pain, right 09/05/2013  . ADHD (attention deficit hyperactivity disorder) 01/27/2011  . ANXIETY 02/20/2010  . ALLERGIC RHINITIS 11/21/2009  . OVARIAN CYST 11/21/2009  . Diabetes mellitus type 2 with neurological manifestations ([place]) 11/20/2009  . OBESITY, UNSPECIFIED 11/20/2009  . Migraine without aura 11/20/2009    Current Outpatient Prescriptions on File Prior to Visit  Medication Sig Dispense Refill  . almotriptan (AXERT) 12.5 MG tablet Take 1 tablet (12.5 mg total) by mouth as needed for migraine. may repeat in 2 hours if needed 12 tablet 3  . ALPRAZolam (XANAX) 0.5 MG tablet Take 1 tablet (0.5 mg total) by mouth 3 (three) times daily as needed for anxiety. 30 tablet 1  .  amphetamine-dextroamphetamine (ADDERALL XR) 20 MG 24 hr capsule Take 1 capsule (20 mg total) by mouth daily. 30 capsule 0  . aspirin 81 MG tablet Take 81 mg by mouth daily.      . Cholecalciferol (VITAMIN D) 2000 UNITS CAPS Take 2,000 Units by mouth daily.     . citalopram (CELEXA) 20 MG tablet Take 1 tablet (20 mg total) by mouth daily. 90 tablet 2  . EPINEPHrine (EPIPEN) 0.3 mg/0.3 mL IJ SOAJ injection Inject 0.3 mLs (0.3 mg total) into the muscle once. 1 Device 1  . fluticasone (FLONASE) 50 MCG/ACT nasal spray Place 2 sprays into both nostrils daily.   1  . glucose blood (TRUETEST TEST) test strip Use as instructed 100 each 3  . Insulin Glargine  (LANTUS SOLOSTAR) 100 UNIT/ML Solostar Pen Inject 24 Units into the skin at bedtime. 15 pen 1  . Insulin Pen Needle 32G X 4 MM MISC Use 1x a day 100 each 1  . lamoTRIgine (LAMICTAL) 100 MG tablet Take 1 tablet (100 mg total) by mouth 2 (two) times daily. 180 tablet 0  . levonorgestrel (MIRENA) 20 MCG/24HR IUD 1 Intra Uterine Device (1 each total) by Intrauterine route once. 1 each 0  . metFORMIN (GLUCOPHAGE) 1000 MG tablet Take 1 tablet (1,000 mg total) by mouth 2 (two) times daily with a meal. 180 tablet 1  . Prenatal Vit-Fe Fumarate-FA (MULTIVITAMIN-PRENATAL) 27-0.8 MG TABS tablet Take 1 tablet by mouth daily at 12 noon.    . promethazine (PHENERGAN) 25 MG tablet Take 25 mg by mouth every 6 (six) hours as needed for nausea.    [name] topiramate (TOPAMAX) 25 MG tablet TAKE 2 TABLETS TWICE A DAY 120 tablet 0  . traMADol-acetaminophen (ULTRACET) 37.5-325 MG per tablet Take 1 tablet by mouth every 6 (six) hours as needed for pain.    [name] triamcinolone cream (KENALOG) 0.1 % Apply topically 2 (two) times daily as needed. 30 g 1   No current facility-administered medications on file prior to visit.    Past Medical History  Diagnosis Date  . Obesity, unspecified   . MIGRAINE, COMMON   . OVARIAN CYST   . ALLERGIC RHINITIS   . Anemia   . ANXIETY   . DIABETES MELLITUS, TYPE II   . ADHD (attention deficit hyperactivity disorder)     Past Surgical History  Procedure Laterality Date  . No past surgeries      Social History   Social History  . Marital Status: Married    Spouse Name: N/A  . Number of Children: N/A  . Years of Education: N/A   Social History Main Topics  . Smoking status: Former Smoker    Quit date: 08/20/2002  . Smokeless tobacco: Never Used     Comment: single, separated from spouse 02/2010.   [name] Alcohol Use: Yes     Comment: occasionally  . Drug Use: No  . Sexual Activity: Yes    Birth Control/ Protection: IUD   Other Topics Concern  . None   Social History Narrative     Single, separated from spouse 02/2010    Review of Systems  Constitutional: Negative for fever and chills.  Respiratory: Positive for shortness of breath (with moderate exertion and with anxiety). Negative for cough and wheezing.   Cardiovascular: Positive for chest pain, palpitations (every other day on average) and leg swelling (occasional).  Gastrointestinal: Positive for nausea (with GERD and migraines) and abdominal pain (LUQ intermittent). Negative for diarrhea, constipation and blood in  stool (no melena).       GERD - 3 /week  Neurological: Positive for light-headedness and headaches. Negative for dizziness.  Psychiatric/Behavioral: Negative for dysphoric mood. The patient is not nervous/anxious.        Objective:   Filed Vitals:   06/22/15 1533  BP: 102/74  Pulse: 133  Temp: 98.7 F (37.1 C)  Resp: 16   Filed Weights   06/22/15 1533  Weight: 211 lb (95.709 kg)   Body mass index is 36.2 kg/(m^2).   Physical Exam  Constitutional: She appears well-developed and well-nourished. No distress.  HENT:  Head: Normocephalic and atraumatic.  Neck: Neck supple. No tracheal deviation present. No thyromegaly present.  No carotid bruit  Cardiovascular: Regular rhythm and normal heart sounds.   No murmur heard. tachycardia  Pulmonary/Chest: Effort normal and breath sounds normal. No respiratory distress. She has no wheezes. She has no rales.  Abdominal: Soft. She exhibits no distension. There is no tenderness.  Musculoskeletal: She exhibits no edema.  Lymphadenopathy:    She has no cervical adenopathy.  Skin: Skin is warm and dry.  Psychiatric: She has a normal mood and affect. Her behavior is normal. Judgment and thought content normal.        Assessment & Plan:   See Problem List.  Follow up in 6 months

## 2015-06-22 NOTE — Progress Notes (Signed)
Pre visit review using our clinic review tool, if applicable. No additional management support is needed unless otherwise documented below in the visit note. 

## 2015-06-24 DIAGNOSIS — K219 Gastro-esophageal reflux disease without esophagitis: Secondary | ICD-10-CM | POA: Insufficient documentation

## 2015-06-24 NOTE — Assessment & Plan Note (Signed)
Following with endocrine and was placed on lantus not long ago Sugars at home are well controlled and I expect her a1c to be much improved Continue regular exercise, compliance with a diabetic diet and weight loss efforts Eye exam up to date, checks feet daily Will check a1c since she is getting blood work done anyway

## 2015-06-24 NOTE — Assessment & Plan Note (Signed)
Stable, controlled Taking medication appropriately Continue adderall at current dose

## 2015-06-24 NOTE — Assessment & Plan Note (Signed)
Was on topamax, but stopped to decreased number of meds she is on. No side effects from med in past Taking phenergan/axert if needed and ultracet only if other meds are not effective, which is not often Work on avoiding triggers

## 2015-06-24 NOTE — Assessment & Plan Note (Signed)
Stable, controlled Continue celexa, lamictal at current doses Has seen psych and has been stable on current regimen

## 2015-06-24 NOTE — Assessment & Plan Note (Signed)
Sinus tachycardia on recent EKG Tachycardia too often and I worry about cause and her developing cardiomyopathy Check tsh, basic labs Will refer to cardio

## 2015-06-24 NOTE — Assessment & Plan Note (Signed)
Will see GI Has tried multiple meds in the past - not effective Reviewed GERD diet Work on weight loss

## 2015-06-24 NOTE — Assessment & Plan Note (Signed)
Controlled, stable Takes xanax only as needed, which is not often Taking celexa and lamictal Will continue current medications at current doses

## 2015-06-27 ENCOUNTER — Ambulatory Visit (INDEPENDENT_AMBULATORY_CARE_PROVIDER_SITE_OTHER): Payer: BLUE CROSS/BLUE SHIELD | Admitting: Cardiovascular Disease

## 2015-06-27 ENCOUNTER — Encounter: Payer: Self-pay | Admitting: Cardiovascular Disease

## 2015-06-27 VITALS — BP 112/80 | HR 117 | Ht 64.0 in | Wt 215.8 lb

## 2015-06-27 DIAGNOSIS — R000 Tachycardia, unspecified: Secondary | ICD-10-CM

## 2015-06-27 DIAGNOSIS — R079 Chest pain, unspecified: Secondary | ICD-10-CM

## 2015-06-27 DIAGNOSIS — R0602 Shortness of breath: Secondary | ICD-10-CM | POA: Diagnosis not present

## 2015-06-27 NOTE — Patient Instructions (Signed)
Your physician has requested that you have en exercise stress myoview. For further information please visit [email or web address]. Please follow instruction sheet, as given.  Dr Oval Linsey recommends that you schedule a follow-up appointment in 3 months.  If you need a refill on your cardiac medications before your next appointment, please call your pharmacy.

## 2015-06-27 NOTE — Progress Notes (Signed)
Cardiology Office Note   Date:  06/27/2015   ID:  Lisa Duran Duran, DOB [birth year], MRN [id number]  PCP:  Binnie Rail, MD  Cardiologist:   Sharol Harness, MD   Chief Complaint  Patient presents with  . New Evaluation  . Chest Pain    some chest pain, some light headedness when having chest pain  . Shortness of Breath    only with exertion and when she having some chest pain  . Edema    hands and ankles      History of Present Illness: Lisa Duran Duran is a 34 y.o. female with diabetes, ADHD and migraines who presents for an evaluation of sinus tachycardia.  Lisa Duran Duran saw her PCP, Dr. Billey Gosling on 11/11.  She was noted to have sinus tachycardia that was thought to be due to anxiety.  tt was noted that her heart rate has been fast in the past.  An echo was ordered and she was referred to cardiology.  Lisa Duran Duran reports intermittent episodes of chest pain that have been ongoing for over a year.  She was seen in the ED 06/2014 for chest pain that was not thought to be cardiac in nature.  The worst episode awakened her from sleep, but it also occurs with exertion.  It feels like she is wearing a corset and is associated with shortness of breath.  She endorses associated lightheadedness and palpitaitons. It feels like her heart is jumping out of her chest.  She has anxiety, but this is different than her episodes of anxiety or GERD.  It does not respond to Xanax or Pepto-bismol. The episodes can last for a couple hours at a time or up to all day. It occurs with exertions but has also awakened her from sleep. There is associated nausea, lightheadedness and mild diaphoresis. She denies vomiting or syncope.  Lisa Duran Duran has limited her physical activity due to fear of having an episode again. She used to like to hike but has not been doing this lately because it causes her to have chest tightness and shortness of breath. When she last did that she fell and she was going to pass out. She does exercise  with yoga and riding her exercise bike. She has noticed increased shortness of breath with yoga, especially in certain poses.  She denies fever, chills, cough, or dysuria. She's not been on any recent long car or plane rides.  Lisa Duran Duran's father had a heart attack and a stroke at age 31. Both his parents died of heart disease in her paternal aunt had coronary disease before age 104. She's noted a 30 pound weight gain in the last year which she partly attributes to recently starting Lantus. She's been working on improving her diet and has been counting carbohydrates. She typically drinks 2 diet sodas with caffeine daily. She very occasionally drinks coffee. She has been taking Adderall daily for several years. However she does not take it on the weekends. Her episodes of chest tightness and palpitations occur both during the weekday and on the weekends.   Past Medical History  Diagnosis Date  . Obesity, unspecified   . MIGRAINE, COMMON   . OVARIAN CYST   . ALLERGIC RHINITIS   . Anemia   . ANXIETY   . DIABETES MELLITUS, TYPE II   . ADHD (attention deficit hyperactivity disorder)     Past Surgical History  Procedure Laterality Date  . No past surgeries  Current Outpatient Prescriptions  Medication Sig Dispense Refill  . almotriptan (AXERT) 12.5 MG tablet Take 1 tablet (12.5 mg total) by mouth as needed for migraine. may repeat in 2 hours if needed 12 tablet 3  . ALPRAZolam (XANAX) 0.5 MG tablet Take 1 tablet (0.5 mg total) by mouth 3 (three) times daily as needed for anxiety. 30 tablet 1  . amphetamine-dextroamphetamine (ADDERALL XR) 20 MG 24 hr capsule Take 1 capsule (20 mg total) by mouth daily. 30 capsule 0  . aspirin 81 MG tablet Take 81 mg by mouth daily.      . Cholecalciferol (VITAMIN D) 2000 UNITS CAPS Take 2,000 Units by mouth daily.     . citalopram (CELEXA) 20 MG tablet Take 1 tablet (20 mg total) by mouth daily. 90 tablet 2  . EPINEPHrine (EPIPEN) 0.3 mg/0.3 mL IJ SOAJ  injection Inject 0.3 mLs (0.3 mg total) into the muscle once. 1 Device 1  . fluticasone (FLONASE) 50 MCG/ACT nasal spray Place 2 sprays into both nostrils daily.   1  . glucose blood (TRUETEST TEST) test strip Use as instructed 100 each 3  . Insulin Glargine (LANTUS SOLOSTAR) 100 UNIT/ML Solostar Pen Inject 24 Units into the skin at bedtime. 15 pen 1  . Insulin Pen Needle 32G X 4 MM MISC Use 1x a day 100 each 1  . lamoTRIgine (LAMICTAL) 100 MG tablet Take 1 tablet (100 mg total) by mouth 2 (two) times daily. 180 tablet 1  . levonorgestrel (MIRENA) 20 MCG/24HR IUD 1 Intra Uterine Device (1 each total) by Intrauterine route once. 1 each 0  . metFORMIN (GLUCOPHAGE) 1000 MG tablet Take 1 tablet (1,000 mg total) by mouth 2 (two) times daily with a meal. 180 tablet 1  . Prenatal Vit-Fe Fumarate-FA (MULTIVITAMIN-PRENATAL) 27-0.8 MG TABS tablet Take 1 tablet by mouth daily at 12 noon.    . promethazine (PHENERGAN) 25 MG tablet Take 25 mg by mouth every 6 (six) hours as needed for nausea.    . traMADol-acetaminophen (ULTRACET) 37.5-325 MG per tablet Take 1 tablet by mouth every 6 (six) hours as needed for pain.    [name] triamcinolone cream (KENALOG) 0.1 % Apply topically 2 (two) times daily as needed. 30 g 1   No current facility-administered medications for this visit.    Allergies:   Chicken allergy; Gluten meal; Other; and Victoza    Social History:  The patient  reports that she quit smoking about 12 years ago. She has never used smokeless tobacco. She reports that she drinks alcohol. She reports that she does not use illicit drugs.   Family History:  The patient's family history includes Cancer in her father; Cancer (age of onset: 25) in her maternal grandfather; Coronary artery disease in her father; Diabetes in her father and mother; Hyperlipidemia in her father and mother; Hypertension in her father; Stroke in her father.    ROS:  Please see the history of present illness.   Otherwise, review of  systems are positive for none.   All other systems are reviewed and negative.    PHYSICAL EXAM: VS:  BP 112/80 mmHg  Pulse 117  Ht 5\' 4"  (1.626 m)  Wt 97.886 kg (215 lb 12.8 oz)  BMI 37.02 kg/m2 , BMI Body mass index is 37.02 kg/(m^2). GENERAL:  Well appearing HEENT:  Pupils equal round and reactive, fundi not visualized, oral mucosa unremarkable NECK:  No jugular venous distention, waveform within normal limits, carotid upstroke brisk and symmetric, no bruits, no thyromegaly  LYMPHATICS:  No cervical adenopathy LUNGS:  Clear to auscultation bilaterally HEART:  Tachycardic.  Regular rhythm.  PMI not displaced or sustained,S1 and S2 within normal limits, no S3, no S4, no clicks, no rubs, no murmurs ABD:  Flat, positive bowel sounds normal in frequency in pitch, no bruits, no rebound, no guarding, no midline pulsatile mass, no hepatomegaly, no splenomegaly EXT:  2 plus pulses throughout, no edema, no cyanosis no clubbing SKIN:  No rashes no nodules NEURO:  Cranial nerves II through XII grossly intact, motor grossly intact throughout PSYCH:  Cognitively intact, oriented to person place and time   EKG:  EKG is ordered today. The ekg ordered today demonstrates sinus tachycardia rate 117 bpm. Nonspecific ST/T changes.   Recent Labs: 07/19/2014: Hemoglobin 11.1*; Platelets 495* 12/19/2014: ALT 35; BUN 8; Creatinine, Ser 0.73; Potassium 3.5; Sodium 134*    Lipid Panel    Component Value Date/Time   CHOL 200 01/19/2014 1041   TRIG 102.0 01/19/2014 1041   HDL 67.50 01/19/2014 1041   CHOLHDL 3 01/19/2014 1041   VLDL 20.4 01/19/2014 1041   LDLCALC 112* 01/19/2014 1041      Wt Readings from Last 3 Encounters:  06/27/15 97.886 kg (215 lb 12.8 oz)  06/22/15 95.709 kg (211 lb)  06/07/15 96.979 kg (213 lb 12.8 oz)      ASSESSMENT AND PLAN:  # Atypical chest pain/shortness of breath: Ms. Susan's symptoms are atypical. However she does have a family history of premature coronary artery  disease. Therefore we will refer her for exercise Cardiolite. She does not have any exam findings concerning for heart failure. If her stress test is normal we will refer her for an echo.  # Sinus tachycardia: Ms. Stepanski has asymptomatic sinus tachycardia.  She's not been on any recent car rides or plane rides and this has been long-standing so it is unlikely that this represents PE. She has no fever or chills and no other infectious symptoms. She does appear anxious on interview today. We will check her thyroid function as well as assess for anemia. It's quite possible that her Adderall may be contributing to her resting tachycardia. If the above workup returns unremarkable, would considering switching her ADHD medication to a medicine such as Strattera which is less likely to cause tachycardia.  She has no signs of heart failure on exam today. It is unlikely that she has any tachycardia-induced cardiomyopathy.    Current medicines are reviewed at length with the patient today.  The patient does not have concerns regarding medicines.  The following changes have been made:  no change  Labs/ tests ordered today include:   Orders Placed This Encounter  Procedures  . Myocardial Perfusion Imaging  . EKG 12-Lead     Disposition:   FU with Nishawn Rotan C. Oval Linsey, MD in 3 months.   Signed, Sharol Harness, MD  06/27/2015 7:27 PM    [place]

## 2015-06-29 ENCOUNTER — Telehealth (HOSPITAL_COMMUNITY): Payer: Self-pay

## 2015-06-29 ENCOUNTER — Other Ambulatory Visit (INDEPENDENT_AMBULATORY_CARE_PROVIDER_SITE_OTHER): Payer: BLUE CROSS/BLUE SHIELD

## 2015-06-29 DIAGNOSIS — E1149 Type 2 diabetes mellitus with other diabetic neurological complication: Secondary | ICD-10-CM

## 2015-06-29 DIAGNOSIS — R000 Tachycardia, unspecified: Secondary | ICD-10-CM

## 2015-06-29 LAB — CBC WITH DIFFERENTIAL/PLATELET
Basophils Absolute: 0.1 10*3/uL (ref 0.0–0.1)
Basophils Relative: 0.6 % (ref 0.0–3.0)
EOS PCT: 2.2 % (ref 0.0–5.0)
Eosinophils Absolute: 0.2 10*3/uL (ref 0.0–0.7)
HCT: 36.8 % (ref 36.0–46.0)
HEMOGLOBIN: 11.6 g/dL — AB (ref 12.0–15.0)
LYMPHS ABS: 2.1 10*3/uL (ref 0.7–4.0)
Lymphocytes Relative: 21.2 % (ref 12.0–46.0)
MCHC: 31.5 g/dL (ref 30.0–36.0)
MCV: 74.6 fl — AB (ref 78.0–100.0)
MONOS PCT: 7.7 % (ref 3.0–12.0)
Monocytes Absolute: 0.8 10*3/uL (ref 0.1–1.0)
Neutro Abs: 6.6 10*3/uL (ref 1.4–7.7)
Neutrophils Relative %: 68.3 % (ref 43.0–77.0)
Platelets: 538 10*3/uL — ABNORMAL HIGH (ref 150.0–400.0)
RBC: 4.94 Mil/uL (ref 3.87–5.11)
RDW: 17.9 % — ABNORMAL HIGH (ref 11.5–15.5)
WBC: 9.7 10*3/uL (ref 4.0–10.5)

## 2015-06-29 LAB — HEMOGLOBIN A1C: HEMOGLOBIN A1C: 7.7 % — AB (ref 4.6–6.5)

## 2015-06-29 LAB — COMPREHENSIVE METABOLIC PANEL
ALBUMIN: 4 g/dL (ref 3.5–5.2)
ALK PHOS: 116 U/L (ref 39–117)
ALT: 14 U/L (ref 0–35)
AST: 11 U/L (ref 0–37)
BUN: 8 mg/dL (ref 6–23)
CHLORIDE: 103 meq/L (ref 96–112)
CO2: 26 mEq/L (ref 19–32)
Calcium: 9.3 mg/dL (ref 8.4–10.5)
Creatinine, Ser: 0.67 mg/dL (ref 0.40–1.20)
GFR: 105.42 mL/min (ref >60.00)
GLUCOSE: 147 mg/dL — AB (ref 70–99)
POTASSIUM: 4.2 meq/L (ref 3.5–5.1)
SODIUM: 137 meq/L (ref 135–145)
Total Bilirubin: 0.2 mg/dL (ref 0.2–1.2)
Total Protein: 7.3 g/dL (ref 6.0–8.3)

## 2015-06-29 LAB — LIPID PANEL
Cholesterol: 196 mg/dL (ref 0–200)
HDL: 66 mg/dL (ref >39.00)
LDL Cholesterol: 110 mg/dL — ABNORMAL HIGH (ref 0–99)
NONHDL: 129.72
TRIGLYCERIDES: 100 mg/dL (ref 0.0–149.0)
Total CHOL/HDL Ratio: 3
VLDL: 20 mg/dL (ref 0.0–40.0)

## 2015-06-29 LAB — TSH: TSH: 2.4 u[IU]/mL (ref 0.35–4.50)

## 2015-06-29 NOTE — Telephone Encounter (Signed)
Encounter complete. 

## 2015-07-02 ENCOUNTER — Encounter: Payer: Self-pay | Admitting: Internal Medicine

## 2015-07-04 ENCOUNTER — Ambulatory Visit (HOSPITAL_COMMUNITY)
Admission: RE | Admit: 2015-07-04 | Discharge: 2015-07-04 | Disposition: A | Discharge status: Home / Self Care | Payer: BLUE CROSS/BLUE SHIELD | Source: Ambulatory Visit | Attending: Cardiovascular Disease | Admitting: Cardiovascular Disease

## 2015-07-04 ENCOUNTER — Telehealth: Payer: Self-pay | Admitting: Cardiovascular Disease

## 2015-07-04 DIAGNOSIS — E669 Obesity, unspecified: Secondary | ICD-10-CM | POA: Diagnosis not present

## 2015-07-04 DIAGNOSIS — R42 Dizziness and giddiness: Secondary | ICD-10-CM | POA: Diagnosis not present

## 2015-07-04 DIAGNOSIS — R079 Chest pain, unspecified: Secondary | ICD-10-CM | POA: Insufficient documentation

## 2015-07-04 DIAGNOSIS — Z8249 Family history of ischemic heart disease and other diseases of the circulatory system: Secondary | ICD-10-CM | POA: Diagnosis not present

## 2015-07-04 DIAGNOSIS — R002 Palpitations: Secondary | ICD-10-CM | POA: Insufficient documentation

## 2015-07-04 DIAGNOSIS — R5383 Other fatigue: Secondary | ICD-10-CM | POA: Insufficient documentation

## 2015-07-04 DIAGNOSIS — R0602 Shortness of breath: Secondary | ICD-10-CM

## 2015-07-04 DIAGNOSIS — Z6837 Body mass index (BMI) 37.0-37.9, adult: Secondary | ICD-10-CM | POA: Diagnosis not present

## 2015-07-04 DIAGNOSIS — R0609 Other forms of dyspnea: Secondary | ICD-10-CM | POA: Diagnosis not present

## 2015-07-04 DIAGNOSIS — Z87891 Personal history of nicotine dependence: Secondary | ICD-10-CM | POA: Diagnosis not present

## 2015-07-04 LAB — MYOCARDIAL PERFUSION IMAGING
Estimated workload: 8.5 METS
Exercise duration (min): 7 min
LV dias vol: 49 mL
LV sys vol: 15 mL
MPHR: 184 {beats}/min
Peak HR: 173 {beats}/min
Percent HR: 94 %
Rest HR: 97 {beats}/min
SDS: 0
SRS: 3
SSS: 3
TID: 1.06

## 2015-07-04 MED ORDER — TECHNETIUM TC 99M SESTAMIBI GENERIC - CARDIOLITE
31.8000 | Freq: Once | INTRAVENOUS | Status: AC | PRN
  Administered 2015-07-04: 31.8 via INTRAVENOUS

## 2015-07-04 MED ORDER — TECHNETIUM TC 99M SESTAMIBI GENERIC - CARDIOLITE
10.4000 | Freq: Once | INTRAVENOUS | Status: AC | PRN
  Administered 2015-07-04: 10.4 via INTRAVENOUS

## 2015-07-04 NOTE — Telephone Encounter (Signed)
Pt is calling back to get results from a test  Thanks

## 2015-07-04 NOTE — Telephone Encounter (Signed)
Results given verbalized understanding.

## 2015-07-18 ENCOUNTER — Encounter: Payer: Self-pay | Admitting: Internal Medicine

## 2015-08-08 ENCOUNTER — Encounter: Payer: Self-pay | Admitting: Internal Medicine

## 2015-08-08 ENCOUNTER — Ambulatory Visit (INDEPENDENT_AMBULATORY_CARE_PROVIDER_SITE_OTHER): Payer: BLUE CROSS/BLUE SHIELD | Admitting: Gastroenterology

## 2015-08-08 ENCOUNTER — Ambulatory Visit (INDEPENDENT_AMBULATORY_CARE_PROVIDER_SITE_OTHER): Payer: BLUE CROSS/BLUE SHIELD | Admitting: Internal Medicine

## 2015-08-08 ENCOUNTER — Encounter: Payer: Self-pay | Admitting: Gastroenterology

## 2015-08-08 VITALS — BP 102/80 | HR 76 | Ht 64.0 in | Wt 217.6 lb

## 2015-08-08 VITALS — BP 118/80 | HR 114 | Temp 98.7°F | Wt 217.0 lb

## 2015-08-08 DIAGNOSIS — E1149 Type 2 diabetes mellitus with other diabetic neurological complication: Secondary | ICD-10-CM

## 2015-08-08 DIAGNOSIS — K219 Gastro-esophageal reflux disease without esophagitis: Secondary | ICD-10-CM

## 2015-08-08 MED ORDER — SITAGLIPTIN PHOSPHATE 100 MG PO TABS: 100.0000 mg | ORAL_TABLET | Freq: Every day | ORAL | Status: DC

## 2015-08-08 MED ORDER — OMEPRAZOLE 40 MG PO CPDR: 40.0000 mg | DELAYED_RELEASE_CAPSULE | Freq: Every day | ORAL | Status: DC

## 2015-08-08 NOTE — Progress Notes (Signed)
HPI: This is a    Very pleasant 34 year old woman    who was referred to me by Rowe Clack, MD  to evaluate   GERD, left upper quadrant pains .    Chief complaint is  GERD, left upper quadrant pains , intermittent dysphasia   Has had pyrosis for many many years.  In past several months it is worsening. Keeping her up at night.  She has pains in LUQ that is intermittent, achy, painful.  Associated with nausea, can last several hours.  Pepto often helps.  Overall has gained weight in past year.  Taken tums, pepto.; tried OTC prilosec 82m pill, 2 week course.  In pm at bedtime.    Pepcid, zantac on PRN.  No NSAIDs.  + solid food dysphagia daily, drinks extra water to get it down. Chews very well.  Review of systems: Pertinent positive and negative review of systems were noted in the above HPI section. Complete review of systems was performed and was otherwise normal.   Past Medical History  Diagnosis Date  . Obesity, unspecified   . MIGRAINE, COMMON   . OVARIAN CYST   . ALLERGIC RHINITIS   . Anemia   . ANXIETY   . DIABETES MELLITUS, TYPE II   . ADHD (attention deficit hyperactivity disorder)     Past Surgical History  Procedure Laterality Date  . No past surgeries      Current Outpatient Prescriptions  Medication Sig Dispense Refill  . almotriptan (AXERT) 12.5 MG tablet Take 1 tablet (12.5 mg total) by mouth as needed for migraine. may repeat in 2 hours if needed 12 tablet 3  . ALPRAZolam (XANAX) 0.5 MG tablet Take 1 tablet (0.5 mg total) by mouth 3 (three) times daily as needed for anxiety. 30 tablet 1  . amphetamine-dextroamphetamine (ADDERALL XR) 20 MG 24 hr capsule Take 1 capsule (20 mg total) by mouth daily. 30 capsule 0  . citalopram (CELEXA) 20 MG tablet Take 1 tablet (20 mg total) by mouth daily. 90 tablet 2  . EPINEPHrine (EPIPEN) 0.3 mg/0.3 mL IJ SOAJ injection Inject 0.3 mLs (0.3 mg total) into the muscle once. 1 Device 1  . fluticasone (FLONASE) 50  MCG/ACT nasal spray Place 2 sprays into both nostrils daily.   1  . glucose blood (TRUETEST TEST) test strip Use as instructed 100 each 3  . Insulin Glargine (LANTUS SOLOSTAR) 100 UNIT/ML Solostar Pen Inject 24 Units into the skin at bedtime. 15 pen 1  . Insulin Pen Needle 32G X 4 MM MISC Use 1x a day 100 each 1  . lamoTRIgine (LAMICTAL) 100 MG tablet Take 1 tablet (100 mg total) by mouth 2 (two) times daily. 180 tablet 1  . levonorgestrel (MIRENA) 20 MCG/24HR IUD 1 Intra Uterine Device (1 each total) by Intrauterine route once. 1 each 0  . metFORMIN (GLUCOPHAGE) 1000 MG tablet Take 1 tablet (1,000 mg total) by mouth 2 (two) times daily with a meal. 180 tablet 1  . Prenatal Vit-Fe Fumarate-FA (MULTIVITAMIN-PRENATAL) 27-0.8 MG TABS tablet Take 1 tablet by mouth daily at 12 noon.    . promethazine (PHENERGAN) 25 MG tablet Take 25 mg by mouth every 6 (six) hours as needed for nausea.    . sitaGLIPtin (JANUVIA) 100 MG tablet Take 1 tablet (100 mg total) by mouth daily. 30 tablet 5  . traMADol-acetaminophen (ULTRACET) 37.5-325 MG per tablet Take 1 tablet by mouth every 6 (six) hours as needed for pain.    .[name] Kitchentriamcinolone cream (  KENALOG) 0.1 % Apply topically 2 (two) times daily as needed. 30 g 1   No current facility-administered medications for this visit.    Allergies as of 08/08/2015 - Review Complete 08/08/2015  Allergen Reaction Noted  . Chicken allergy  01/16/2013  . Gluten meal Diarrhea 01/19/2015  . Other  01/16/2013  . Victoza [liraglutide] Rash 06/20/2014    Family History  Problem Relation Age of Onset  . Diabetes Mother   . Hyperlipidemia Mother   . Diabetes Father   . Hyperlipidemia Father   . Coronary artery disease Father   . Hypertension Father   . Cancer Father     esoph  . Stroke Father   . Cancer Maternal Grandfather 22    lung, met    Social History   Social History  . Marital Status: Married    Spouse Name: N/A  . Number of Children: N/A  . Years of  Education: N/A   Occupational History  . Not on file.   Social History Main Topics  . Smoking status: Former Smoker    Quit date: 08/20/2002  . Smokeless tobacco: Never Used     Comment: single, separated from spouse 02/2010.   [name] Alcohol Use: Yes     Comment: occasionally  . Drug Use: No  . Sexual Activity: Yes    Birth Control/ Protection: IUD   Other Topics Concern  . Not on file   Social History Narrative   Single, separated from spouse 02/2010     Physical Exam: BP 102/80 mmHg  Pulse 76  Ht _0  (1.626 m)  Wt 217 lb 9.6 oz (98.703 kg)  BMI 37.33 kg/m2 Constitutional: generally well-appearing Psychiatric: alert and oriented x3 Eyes: extraocular movements intact Mouth: oral pharynx moist, no lesions Neck: supple no lymphadenopathy Cardiovascular: heart regular rate and rhythm Lungs: clear to auscultation bilaterally Abdomen: soft, nontender, nondistended, no obvious ascites, no peritoneal signs, normal bowel sounds Extremities: no lower extremity edema bilaterally Skin: no lesions on visible extremities   Assessment and plan: 34 y.o. female with   GERD, dysphasia, left upper quadrant pains   She has fairly classic pyrosis , GERD symptoms and also alarm symptom of dysphagia.   The left upper quadrant pain may be GERD related also , perhaps peptic ulcer related , H. Pylori ?   I recommended we proceed with EGD at her soonest convenience and she will also start prescription strength proton pump inhibitor 20-30 minutes before dinner meal since overnight his generally her worst time for GERD.   Owens Loffler, MD [facility] 08/08/2015, 10:17 AM  Cc: Rowe Clack, MD

## 2015-08-08 NOTE — Patient Instructions (Signed)
Please continue: - Lantus 24 units at bedtime - Metformin 1000 mg 2x a day  Please start Januvia 100 mg in am.   Please return in 3 months with your sugar log.

## 2015-08-08 NOTE — Patient Instructions (Signed)
Prescription strength omeprazole, 20-30 min before dinner meal daily. New script called in today. You will be set up for an upper endoscopy for GERD, dysphagia, LUQ pains.

## 2015-08-08 NOTE — Progress Notes (Signed)
Subjective:     Patient ID: Lisa Duran, female   DOB: 1980-10-30, 34 y.o.   MRN: [id number]  HPI Lisa Duran is a pleasant 34 y.o. woman returning for f/u for DM2, insulin dep., uncontrolled, dx 59/7416, w/o complications. Last visit 2 mo ago.  She had CP >> had a stress test 07/04/2015 >> normal. She will see GI today.  Last HbA1c: Lab Results  Component Value Date   HGBA1C 7.7* 06/29/2015   HGBA1C 9.8 03/22/2015   HGBA1C 9.4* 12/19/2014   She has been initially started on Metformin, at the maximum dose, but then decreased to 500 mg bid due to good control. She was also on Actos, but taken off b/c good control. In the last months, HbA1c trended up, though. PCP suggested Invokana >> pt did not start yet as she wanted to talk to me first.  She is on: - Metformin 1000 mg bid - Lantus 24 units added 03/2015 She was on Invokana 100 mg in am (started 12/2014) >> 2 yeast inf >> tx with Diflucan; she was exhausted and had nocturia >> stopped 03/12/2015 Tried Victoza 2 mo ago >> AP, severe GERD, inj site rxn Tried Januvia >> tolerated it well, but stopped when Invokana was suggested.   She checks sugars 2x a day: - am: 90-120 >> 180-200, 300 (steroids) >> 159-198, 224 >> 110-140, 158 when forgot Lantus >> 87-125 - before lunch: 119-172 >> 98, 129 >> 145 - 2h after lunch: 144, 158-261 >> 86, 104-168 >> n/c - before dinner: 118-202 >> 119-147, 156 >> 97-160 - 2h post dinner: 180-190 >> 200s >> 176-201 >> 141 >> n/c - b/t: 159-190 Hypoglycemia awareness at lower 80's.  Lowest sugars: 70's (delayed a meal) >> 86 . No lows at night. Highest sugar: 300's >> 168 >> 160.  She is doing yoga 3x a week.  No CKD. Lab Results  Component Value Date   BUN 8 06/29/2015   Lab Results  Component Value Date   CREATININE 0.67 06/29/2015   No HL Lab Results  Component Value Date   CHOL 196 06/29/2015   HDL 66.00 06/29/2015   LDLCALC 110* 06/29/2015   TRIG 100.0 06/29/2015   CHOLHDL 3 06/29/2015    Last eye exam 11/2014 - [facility] in [facility]. No DR.  Has numbness and tingling in hands and feet.  I reviewed pt's medications, allergies, PMH, social hx, family hx, and changes were documented in the history of present illness. Otherwise, unchanged from my initial visit note:  Past Medical History  Diagnosis Date  . Obesity, unspecified   . MIGRAINE, COMMON   . OVARIAN CYST   . ALLERGIC RHINITIS   . Anemia   . ANXIETY   . DIABETES MELLITUS, TYPE II   . ADHD (attention deficit hyperactivity disorder)    Past Surgical History  Procedure Laterality Date  . No past surgeries     History   Social History  . Marital Status: Married    Spouse Name: N/A    Number of Children: 0  . Years of Education: N/A   Occupational History  . Estate agent   Social History Main Topics  . Smoking status: Was smoking 1/2 PPD, quit 2009  . Smokeless tobacco: No     Comment: single, separated from spouse 02/2010.   [name] Alcohol Use: Yes, mixed, 2-4 x mo, 1-2 drinks at a time  . Drug Use: No   Social History Narrative   Single, separated  from spouse 02/2010   Current Outpatient Prescriptions on File Prior to Visit  Medication Sig Dispense Refill  . almotriptan (AXERT) 12.5 MG tablet Take 1 tablet (12.5 mg total) by mouth as needed for migraine. may repeat in 2 hours if needed 12 tablet 3  . ALPRAZolam (XANAX) 0.5 MG tablet Take 1 tablet (0.5 mg total) by mouth 3 (three) times daily as needed for anxiety. 30 tablet 1  . amphetamine-dextroamphetamine (ADDERALL XR) 20 MG 24 hr capsule Take 1 capsule (20 mg total) by mouth daily. 30 capsule 0  . Cholecalciferol (VITAMIN D) 2000 UNITS CAPS Take 2,000 Units by mouth daily.     . citalopram (CELEXA) 20 MG tablet Take 1 tablet (20 mg total) by mouth daily. 90 tablet 2  . fluticasone (FLONASE) 50 MCG/ACT nasal spray Place 2 sprays into both nostrils daily.   1  . glucose blood (TRUETEST TEST) test strip Use as instructed 100 each  3  . Insulin Glargine (LANTUS SOLOSTAR) 100 UNIT/ML Solostar Pen Inject 24 Units into the skin at bedtime. 15 pen 1  . Insulin Pen Needle 32G X 4 MM MISC Use 1x a day 100 each 1  . lamoTRIgine (LAMICTAL) 100 MG tablet Take 1 tablet (100 mg total) by mouth 2 (two) times daily. 180 tablet 1  . levonorgestrel (MIRENA) 20 MCG/24HR IUD 1 Intra Uterine Device (1 each total) by Intrauterine route once. 1 each 0  . metFORMIN (GLUCOPHAGE) 1000 MG tablet Take 1 tablet (1,000 mg total) by mouth 2 (two) times daily with a meal. 180 tablet 1  . Prenatal Vit-Fe Fumarate-FA (MULTIVITAMIN-PRENATAL) 27-0.8 MG TABS tablet Take 1 tablet by mouth daily at 12 noon.    . promethazine (PHENERGAN) 25 MG tablet Take 25 mg by mouth every 6 (six) hours as needed for nausea.    . traMADol-acetaminophen (ULTRACET) 37.5-325 MG per tablet Take 1 tablet by mouth every 6 (six) hours as needed for pain.    [name] triamcinolone cream (KENALOG) 0.1 % Apply topically 2 (two) times daily as needed. 30 g 1  . EPINEPHrine (EPIPEN) 0.3 mg/0.3 mL IJ SOAJ injection Inject 0.3 mLs (0.3 mg total) into the muscle once. (Patient not taking: Reported on 08/08/2015) 1 Device 1   No current facility-administered medications on file prior to visit.   Allergies  Allergen Reactions  . Chicken Allergy   . Gluten Meal Diarrhea  . Other     Any Nuts   . Victoza [Liraglutide] Rash    Family History  Problem Relation Age of Onset  . Diabetes Mother   . Hyperlipidemia Mother   . Diabetes Father   . Hyperlipidemia Father   . Coronary artery disease Father   . Hypertension Father   . Cancer Father     esoph  . Stroke Father   . Cancer Maternal Grandfather 17    lung, met   Review of Systems Constitutional: no weight gain, no fatigue, no subjective hyperthermia,  no excessive urination Eyes: no blurry vision, no xerophthalmia ENT: no sore throat, no nodules palpated in throat, no dysphagia/odynophagia, no hoarseness Cardiovascular: no CP/no  SOB/palpitations/ only occasionally leg swelling Respiratory: no cough/SOB Gastrointestinal: no N/V/D/C/ + heartburn. Musculoskeletal: no muscle/ joint aches Skin: no rash, no itching Neurological: no tremors/dizziness, no HAs    Objective:   Physical Exam BP 118/80 mmHg  Pulse 114  Temp(Src) 98.7 F (37.1 C)  Wt 217 lb (98.431 kg) LMP 08/08/2012 Body mass index is 37.23 kg/(m^2). Wt Readings from Last  3 Encounters:  08/08/15 217 lb (98.431 kg)  07/04/15 215 lb (97.523 kg)  06/27/15 215 lb 12.8 oz (97.886 kg)   Constitutional: overweight, in NAD Eyes: PERRLA, EOMI, no exophthalmos ENT: moist mucous membranes, no thyromegaly, no cervical lymphadenopathy Cardiovascular: tachycardia, RR, No MRG Respiratory: CTA B Gastrointestinal: abdomen soft, NT, ND, BS+ Musculoskeletal: no deformities, strength intact in all 4 Skin: moist, warm, rosacea-like rash on upper chest Neurological: no tremor with outstretched hands, DTR normal in all 4  Assessment:     1. DM2, insulin-dependent, uncontrolled, without complications - r/o autoimmunity Component     Latest Ref Rng 08/17/2012  Glutamic Acid Decarb Ab     <=1.0 U/mL <1.0  Pancreatic Islet Cell Antibody     < 5 JDF Units <5  C-Peptide     0.80 - 3.90 ng/mL 1.84  Glucose     70 - 99 mg/dL 104 (H)     2. Tachycardia  Plan:     1. Patient with long-standing, now more controlled diabetes, on oral antidiabetic regimen with Metformin + Lantus with sugars dramatically improved after adding the insulin (latest HbA1c 7.7%, decreased from 8.9%!) but still higher than target later in the day. AM sugars are at goal. Will add Januvia - she tolerated this well in the past. Discussed mech. of action and expected SEs. - I suggested to:  Patient Instructions  Please continue: - Lantus 24 units at bedtime - Metformin 1000 mg 2x a day  Please start Januvia 100 mg in am.   Please return in 3 months with your sugar log.   - continue checking  sugars at different times of the day - check 2 times a day, rotating checks - UTD with yearly eye exams - will check Hba1c at next visit - Return to clinic in 3 mo with sugar log   2. Tachycardia - reviewed latest TSH - normal: Lab Results  Component Value Date   TSH 2.40 06/29/2015  - recent stress test negative

## 2015-08-16 ENCOUNTER — Encounter: Payer: Self-pay | Admitting: Internal Medicine

## 2015-08-21 ENCOUNTER — Encounter: Payer: Self-pay | Admitting: Internal Medicine

## 2015-08-21 ENCOUNTER — Other Ambulatory Visit: Payer: Self-pay | Admitting: *Deleted

## 2015-08-21 MED ORDER — INSULIN PEN NEEDLE 32G X 4 MM MISC: Status: DC

## 2015-08-21 MED ORDER — INSULIN GLARGINE 100 UNIT/ML SOLOSTAR PEN: 24.0000 [IU] | PEN_INJECTOR | Freq: Every day | SUBCUTANEOUS | Status: DC

## 2015-08-23 MED ORDER — AMPHETAMINE-DEXTROAMPHET ER 20 MG PO CP24: 20.0000 mg | ORAL_CAPSULE | Freq: Every day | ORAL | Status: DC

## 2015-08-23 NOTE — Telephone Encounter (Signed)
printed

## 2015-08-24 NOTE — Telephone Encounter (Signed)
Spoke with pt to inform that RX was ready for pick-up

## 2015-09-18 ENCOUNTER — Ambulatory Visit (AMBULATORY_SURGERY_CENTER): Payer: BLUE CROSS/BLUE SHIELD | Admitting: Gastroenterology

## 2015-09-18 ENCOUNTER — Encounter: Payer: Self-pay | Admitting: Gastroenterology

## 2015-09-18 VITALS — BP 104/69 | HR 85 | Temp 98.4°F | Resp 15 | Ht 64.0 in | Wt 217.0 lb

## 2015-09-18 DIAGNOSIS — K2970 Gastritis, unspecified, without bleeding: Secondary | ICD-10-CM

## 2015-09-18 DIAGNOSIS — K219 Gastro-esophageal reflux disease without esophagitis: Secondary | ICD-10-CM

## 2015-09-18 DIAGNOSIS — K2950 Unspecified chronic gastritis without bleeding: Secondary | ICD-10-CM | POA: Diagnosis not present

## 2015-09-18 DIAGNOSIS — K222 Esophageal obstruction: Secondary | ICD-10-CM

## 2015-09-18 DIAGNOSIS — K2990 Gastroduodenitis, unspecified, without bleeding: Secondary | ICD-10-CM | POA: Diagnosis not present

## 2015-09-18 LAB — GLUCOSE, CAPILLARY
Glucose-Capillary: 181 mg/dL — ABNORMAL HIGH (ref 65–99)
Glucose-Capillary: 140 mg/dL — ABNORMAL HIGH (ref 65–99)

## 2015-09-18 MED ORDER — SODIUM CHLORIDE 0.9 % IV SOLN: 500.0000 mL | INTRAVENOUS | Status: DC

## 2015-09-18 MED ORDER — DIPHENHYDRAMINE HCL 50 MG/ML IJ SOLN
25.0000 mg | Freq: Once | INTRAMUSCULAR | Status: AC
  Administered 2015-09-18: 25 mg via INTRAVENOUS

## 2015-09-18 MED ORDER — PREDNISONE 10 MG PO TABS: ORAL_TABLET | ORAL | Status: DC

## 2015-09-18 MED ORDER — EPINEPHRINE HCL 1 MG/ML IJ SOLN
3.0000 mg | Freq: Once | INTRAMUSCULAR | Status: AC
  Administered 2015-09-18: 3 mg via INTRAVENOUS

## 2015-09-18 MED ORDER — METHYLPREDNISOLONE ACETATE 40 MG/ML IJ SUSP
80.0000 mg | Freq: Once | INTRAMUSCULAR | Status: AC
  Administered 2015-09-18: 80 mg via INTRAMUSCULAR

## 2015-09-18 MED ORDER — ALBUTEROL SULFATE (2.5 MG/3ML) 0.083% IN NEBU
2.5000 mg | INHALATION_SOLUTION | Freq: Once | RESPIRATORY_TRACT | Status: AC
  Administered 2015-09-18: 2.5 mg via RESPIRATORY_TRACT

## 2015-09-18 NOTE — Progress Notes (Signed)
I agree with the notes below.  I suspect allergic reaction vs. Airway irritation.  We've added propofol to her list of allergies. She will complete a 5 day tapering steroid dose pack and knows to call if any concerns.

## 2015-09-18 NOTE — Progress Notes (Addendum)
Patient presented to recovery with sats 81%.  O2 placed at 4 liters via nasal canula.  Remained at 81-84% increased o2 to 6L.  Patient complaining of difficulty breathing.  Skin was pale and dusky in color.      1007 Albuterol nebulizer   1008 Epinephrine IV 0.3 mg  1009 Benadry 25 mg IV  Dr Ardis Hughs requested pulmonary dept to be called to have an MD come to recovery.  Dr. Larkin Ina arrived to recovery.  Orders received and carried out per Dr. Larkin Ina and Ardis Hughs'.  1013 2nd Albuterol Nebulizer per D.O.  1018 Depomedrol 80mg  given IM upper left arm.  1025- 3rd Albuterol Nebulizer given per D.O.  After meds patient states that chest is "less tight"   1030- pt on nonrebreather per D.O,- sats 100 percent  1036-  Changed to nasal cannula- 4/liters- 98-100%  1042- 3/liters- 98%  1048- 2/liters- 99-99%  1058- 1/liter- 98 %  1104- changed to room air  Pt pink, no dyspnea, some coughing noted  1110- sats 97% on room air She states she still has some upper chest discomfort, with deep breathing- states pain was a '"10" during the worst time, rates as a "3" now  1128- before discharge, pt c/o still some upper chest discomfort, rating as a "2" now.  I stressed to her the importance of deep breathing, even if uncomfortable and understanding voiced

## 2015-09-18 NOTE — Op Note (Signed)
[facility]  [address] [place], [id number]   ENDOSCOPY PROCEDURE REPORT  PATIENT: Lisa Duran, Lisa Duran  MR#: [id number] BIRTHDATE: 1980/11/24 , 34  yrs. old GENDER: female ENDOSCOPIST: Milus Banister, MD REFERRED BY:  Billey Gosling, MD PROCEDURE DATE:  09/18/2015 PROCEDURE:  EGD w/ biopsy and EGD w/ balloon dilation ASA CLASS:     Class II INDICATIONS:  GERD, dysphagia. MEDICATIONS: Monitored anesthesia care, Propofol 250 mg IV, and lidocaine 180mg  IV TOPICAL ANESTHETIC: none  DESCRIPTION OF PROCEDURE: After the risks benefits and alternatives of the procedure were thoroughly explained, informed consent was obtained.  The LB [id number] endoscope was introduced through the mouth and advanced to the second portion of the duodenum , Without limitations.  The instrument was slowly withdrawn as the mucosa was fully examined.  There was mild, non-specific pan gastritis.  The stomach was biopsied in antrum and body and sent to pathology.  There was a 2cm hiatal hernia without Cameron's type erosions.  There was a benign appearing, focal peptic stricture at the GE junction (lumen 62mm). This was dilated with CRE TTS balloon held inflated to 66mm for one minute.  There was the typical superficial mucosal tear and self-limited oozing of blood following dilation.  The examination was otherwise normal.  Retroflexed views revealed no abnormalities. The scope was then withdrawn from the patient and the procedure completed.  COMPLICATIONS: There were no immediate complications.  ENDOSCOPIC IMPRESSION: There was mild, non-specific pan gastritis.  The stomach was biopsied in antrum and body and sent to pathology.  There was a 2cm hiatal hernia without Cameron's type erosions.  There was a benign appearing, focal peptic stricture at the GE junction (lumen 35mm). This was dilated with CRE TTS balloon held inflated to 7mm for one minute.  There was the typical superficial  mucosal tear and self-limited oozing of blood following dilation.  The examination was otherwise normal  RECOMMENDATIONS: Continue omeprazole 40mg  pill, one pill once daily.  It seems to be helping.  If biopsies show H.  pylori, you will be started on appropriate antibiotics.  If swallowing problems return, please call.   eSigned:  Milus Banister, MD 09/18/2015 9:53 AM

## 2015-09-18 NOTE — Patient Instructions (Addendum)
YOU HAD AN ENDOSCOPIC PROCEDURE TODAY AT [facility]:   Refer to the procedure report that was given to you for any specific questions about what was found during the examination.  If the procedure report does not answer your questions, please call your gastroenterologist to clarify.  If you requested that your care partner not be given the details of your procedure findings, then the procedure report has been included in a sealed envelope for you to review at your convenience later.  YOU SHOULD EXPECT: Some feelings of bloating in the abdomen. Passage of more gas than usual.  Walking can help get rid of the air that was put into your GI tract during the procedure and reduce the bloating.  Please Note:  You might notice some irritation and congestion in your nose or some drainage.  This is from the oxygen used during your procedure.  There is no need for concern and it should clear up in a day or so.  SYMPTOMS TO REPORT IMMEDIATELY:   Following upper endoscopy (EGD)  Vomiting of blood or coffee ground material  New chest pain or pain under the shoulder blades  Painful or persistently difficult swallowing  New shortness of breath  Fever of 100F or higher  Black, tarry-looking stools  For urgent or emergent issues, a gastroenterologist can be reached at any hour by calling [phone].   DIET: see dilation diet handout- follow today.  Drink plenty of fluids but you should avoid alcoholic beverages for 24 hours.  ACTIVITY:  You should plan to take it easy for the rest of today and you should NOT DRIVE or use heavy machinery until tomorrow (because of the sedation medicines used during the test).    FOLLOW UP: Our staff will call the number listed on your records the next business day following your procedure to check on you and address any questions or concerns that you may have regarding the information given to you following your procedure. If we do not reach you, we will  leave a message.  However, if you are feeling well and you are not experiencing any problems, there is no need to return our call.  We will assume that you have returned to your regular daily activities without incident.  If any biopsies were taken you will be contacted by phone or by letter within the next 1-3 weeks.  Please call us at [phone] if you have not heard about the biopsies in 3 weeks.    SIGNATURES/CONFIDENTIALITY: You and/or your care partner have signed paperwork which will be entered into your electronic medical record.  These signatures attest to the fact that that the information above on your After Visit Summary has been reviewed and is understood.  Full responsibility of the confidentiality of this discharge information lies with you and/or your care-partner.  Please follow dilation diet today  Await pathology  Your prescription was sent to your [facility] pharmacy- please follow tapering dose until completed  Please read over handouts about stricture and gastrits

## 2015-09-18 NOTE — Progress Notes (Signed)
Called to room to assist during endoscopic procedure.  Patient ID and intended procedure confirmed with present staff. Received instructions for my participation in the procedure from the performing physician.  

## 2015-09-18 NOTE — Progress Notes (Signed)
Called back to PACU for pt breathing trouble.  Dr Ardis Hughs there.  Pt breathing "tight"  O2, Nebs, epi, benadryl, steroids given per care team.  Pt breathing better after treatment

## 2015-09-18 NOTE — Progress Notes (Signed)
Report to PACU, RN, vss, BBS= Clear.  

## 2015-09-19 ENCOUNTER — Telehealth: Payer: Self-pay

## 2015-09-19 NOTE — Telephone Encounter (Signed)
  Follow up Call-  Call back number 09/18/2015  Post procedure Call Back phone  # [phone]  Permission to leave phone message Yes     Patient questions:  Do you have a fever, pain , or abdominal swelling? No. Pain Score  0 *  Have you tolerated food without any problems? Yes.    Have you been able to return to your normal activities? Yes.    Do you have any questions about your discharge instructions: Diet   No. Medications  No. Follow up visit  No.  Do you have questions or concerns about your Care? No.  Actions: * If pain score is 4 or above: No action needed, pain <4.  Per the pt no problems noted yesterday or this am. "I feel fine", pt stated. maw

## 2015-10-01 ENCOUNTER — Ambulatory Visit: Payer: Self-pay | Admitting: Cardiovascular Disease

## 2015-10-02 ENCOUNTER — Encounter: Payer: Self-pay | Admitting: Gastroenterology

## 2015-10-24 ENCOUNTER — Encounter: Payer: Self-pay | Admitting: Internal Medicine

## 2015-10-25 MED ORDER — AMPHETAMINE-DEXTROAMPHET ER 20 MG PO CP24: 20.0000 mg | ORAL_CAPSULE | Freq: Every day | ORAL | Status: DC

## 2015-10-25 NOTE — Telephone Encounter (Signed)
rx printed

## 2015-10-25 NOTE — Telephone Encounter (Signed)
Please advise 

## 2015-10-26 NOTE — Telephone Encounter (Signed)
Sent pt via mychart rx ready for pick-up...Lisa Duran

## 2015-11-06 ENCOUNTER — Ambulatory Visit: Payer: Self-pay | Admitting: Internal Medicine

## 2015-11-22 ENCOUNTER — Encounter: Payer: Self-pay | Admitting: Internal Medicine

## 2015-11-22 ENCOUNTER — Ambulatory Visit (INDEPENDENT_AMBULATORY_CARE_PROVIDER_SITE_OTHER): Payer: BLUE CROSS/BLUE SHIELD | Admitting: Internal Medicine

## 2015-11-22 ENCOUNTER — Other Ambulatory Visit (INDEPENDENT_AMBULATORY_CARE_PROVIDER_SITE_OTHER): Payer: BLUE CROSS/BLUE SHIELD | Admitting: *Deleted

## 2015-11-22 VITALS — BP 110/62 | HR 124 | Temp 98.7°F | Resp 12 | Wt 222.6 lb

## 2015-11-22 DIAGNOSIS — R000 Tachycardia, unspecified: Secondary | ICD-10-CM | POA: Diagnosis not present

## 2015-11-22 DIAGNOSIS — Z794 Long term (current) use of insulin: Secondary | ICD-10-CM

## 2015-11-22 DIAGNOSIS — E1149 Type 2 diabetes mellitus with other diabetic neurological complication: Secondary | ICD-10-CM | POA: Diagnosis not present

## 2015-11-22 DIAGNOSIS — E1165 Type 2 diabetes mellitus with hyperglycemia: Secondary | ICD-10-CM

## 2015-11-22 LAB — POCT GLYCOSYLATED HEMOGLOBIN (HGB A1C): HEMOGLOBIN A1C: 8.2

## 2015-11-22 MED ORDER — SITAGLIPTIN PHOSPHATE 100 MG PO TABS: 100.0000 mg | ORAL_TABLET | Freq: Every day | ORAL | Status: DC

## 2015-11-22 MED ORDER — INSULIN PEN NEEDLE 32G X 4 MM MISC: Status: DC

## 2015-11-22 NOTE — Progress Notes (Signed)
Subjective:     Patient ID: Lisa Duran, female   DOB: 03/30/1981, 35 y.o.   MRN: [id number]  HPI Lisa Duran is a pleasant 35 y.o. woman returning for f/u for DM2, insulin dep., uncontrolled, dx 17/4081, w/o complications. Last visit 3.5 mo ago.  She had an EGD on 09/18/2015 >> allergy to Propofol: SOB >> steroid inj, then taper. She also had yeast vaginitis.  Last HbA1c: Lab Results  Component Value Date   HGBA1C 7.7* 06/29/2015   HGBA1C 9.8 03/22/2015   HGBA1C 9.4* 12/19/2014   She has been initially started on Metformin, at the maximum dose, but then decreased to 500 mg bid due to good control. She was also on Actos, but taken off b/c good control. In the last months, HbA1c trended up, though. PCP suggested Invokana >> pt did not start yet as she wanted to talk to me first.  She is on: - Lantus 24 units added 03/2015 - Metformin 1000 mg bid - Januvia 100 mg in am - started 07/2015 She was on Invokana 100 mg in am (started 12/2014) >> 2 yeast inf >> tx with Diflucan; she was exhausted and had nocturia >> stopped 03/12/2015 Tried Victoza 2 mo ago >> AP, severe GERD, inj site rxn Tried Januvia >> tolerated it well, but stopped when Invokana was suggested.   She checks sugars 2x a day: - am: 180-200, 300 (steroids) >> 159-198, 224 >> 110-140, 158 when forgot Lantus >> 87-125 >> 90-110 - before lunch: 119-172 >> 98, 129 >> 145 >> 90-190 - 2h after lunch: 144, 158-261 >> 86, 104-168 >> n/c - before dinner: 118-202 >> 119-147, 156 >> 97-160 >> 80s-110 - 2h post dinner: 180-190 >> 200s >> 176-201 >> 141 >> n/c - b/t: 159-190 >> n/c Hypoglycemia awareness at lower 80's.  Lowest sugars: 70's (delayed a meal) >> 86 >> 80's. No lows at night. Highest sugar: 300's >> 168 >> 160 >> upper 200s - steroids.  She is doing yoga 3x a week.  No CKD. Lab Results  Component Value Date   BUN 8 06/29/2015   Lab Results  Component Value Date   CREATININE 0.67 06/29/2015   No HL Lab Results   Component Value Date   CHOL 196 06/29/2015   HDL 66.00 06/29/2015   LDLCALC 110* 06/29/2015   TRIG 100.0 06/29/2015   CHOLHDL 3 06/29/2015   Last eye exam 06/2015 - [place] [facility] in [facility]. No DR.  Has numbness and tingling in hands and feet.  She had CP >> had a stress test 07/04/2015 >> normal.   I reviewed pt's medications, allergies, PMH, social hx, family hx, and changes were documented in the history of present illness. Otherwise, unchanged from my initial visit note:  Past Medical History  Diagnosis Date  . Obesity, unspecified   . MIGRAINE, COMMON   . OVARIAN CYST   . ALLERGIC RHINITIS   . Anemia   . ANXIETY   . DIABETES MELLITUS, TYPE II   . ADHD (attention deficit hyperactivity disorder)   . GERD (gastroesophageal reflux disease)   . Depression   . Allergy    Past Surgical History  Procedure Laterality Date  . No past surgeries     History   Social History  . Marital Status: Married    Spouse Name: N/A    Number of Children: 0  . Years of Education: N/A   Occupational History  . Estate agent   Social History Main Topics  .  Smoking status: Was smoking 1/2 PPD, quit 2009  . Smokeless tobacco: No     Comment: single, separated from spouse 02/2010.   [name] Alcohol Use: Yes, mixed, 2-4 x mo, 1-2 drinks at a time  . Drug Use: No   Social History Narrative   Single, separated from spouse 02/2010   Current Outpatient Prescriptions on File Prior to Visit  Medication Sig Dispense Refill  . almotriptan (AXERT) 12.5 MG tablet Take 1 tablet (12.5 mg total) by mouth as needed for migraine. may repeat in 2 hours if needed 12 tablet 3  . ALPRAZolam (XANAX) 0.5 MG tablet Take 1 tablet (0.5 mg total) by mouth 3 (three) times daily as needed for anxiety. 30 tablet 1  . amphetamine-dextroamphetamine (ADDERALL XR) 20 MG 24 hr capsule Take 1 capsule (20 mg total) by mouth daily. 30 capsule 0  . benzonatate (TESSALON) 200 MG capsule   0  . citalopram  (CELEXA) 20 MG tablet Take 1 tablet (20 mg total) by mouth daily. 90 tablet 2  . EPINEPHrine (EPIPEN) 0.3 mg/0.3 mL IJ SOAJ injection Inject 0.3 mLs (0.3 mg total) into the muscle once. 1 Device 1  . fluticasone (FLONASE) 50 MCG/ACT nasal spray Place 2 sprays into both nostrils daily.   1  . glucose blood (TRUETEST TEST) test strip Use as instructed 100 each 3  . Insulin Glargine (LANTUS SOLOSTAR) 100 UNIT/ML Solostar Pen Inject 24 Units into the skin at bedtime. 30 mL 1  . Insulin Pen Needle 32G X 4 MM MISC Use 1x a day 100 each 1  . lamoTRIgine (LAMICTAL) 100 MG tablet Take 1 tablet (100 mg total) by mouth 2 (two) times daily. 180 tablet 1  . levonorgestrel (MIRENA) 20 MCG/24HR IUD 1 Intra Uterine Device (1 each total) by Intrauterine route once. 1 each 0  . metFORMIN (GLUCOPHAGE) 1000 MG tablet Take 1 tablet (1,000 mg total) by mouth 2 (two) times daily with a meal. 180 tablet 1  . omeprazole (PRILOSEC) 40 MG capsule Take 1 capsule (40 mg total) by mouth daily before supper. 90 capsule 3  . predniSONE (DELTASONE) 10 MG tablet Take 4 10 mg tablets today (09-18-15) Take 3 10 mg tablets (09-19-15) Take 2 10 mg tablets (09-20-15) Take 1 10 mg tablet (09-21-15) Take 1/2 10 mg tablet (09-22-15) Take 1/2 10 mg tablet (09-23-15) Then done 11 tablet 0  . Prenatal Vit-Fe Fumarate-FA (MULTIVITAMIN-PRENATAL) 27-0.8 MG TABS tablet Take 1 tablet by mouth daily at 12 noon.    . promethazine (PHENERGAN) 25 MG tablet Take 25 mg by mouth every 6 (six) hours as needed for nausea.    . sitaGLIPtin (JANUVIA) 100 MG tablet Take 1 tablet (100 mg total) by mouth daily. 30 tablet 5  . traMADol-acetaminophen (ULTRACET) 37.5-325 MG per tablet Take 1 tablet by mouth every 6 (six) hours as needed for pain.    [name] triamcinolone cream (KENALOG) 0.1 % Apply topically 2 (two) times daily as needed. 30 g 1   No current facility-administered medications on file prior to visit.   Allergies  Allergen Reactions  . Propofol Shortness  Of Breath    SOB, chest tightness, wheezing after colonoscopy on 09-18-15  . Chicken Allergy   . Gluten Meal Diarrhea  . Other     Any Nuts   . Victoza [Liraglutide] Rash    Family History  Problem Relation Age of Onset  . Diabetes Mother   . Hyperlipidemia Mother   . Diabetes Father   . Hyperlipidemia  Father   . Coronary artery disease Father   . Hypertension Father   . Cancer Father     esoph  . Stroke Father   . Cancer Maternal Grandfather 15    lung, met   Review of Systems Constitutional: no weight gain, no fatigue, no subjective hyperthermia,  no excessive urination Eyes: no blurry vision, no xerophthalmia ENT: no sore throat, no nodules palpated in throat, no dysphagia/odynophagia, no hoarseness Cardiovascular: no CP/no SOB/palpitations/ only occasionally leg swelling Respiratory: no cough/SOB Gastrointestinal: no N/V/D/C/ + heartburn. Musculoskeletal: no muscle/ joint aches Skin: no rash, no itching Neurological: no tremors/dizziness, no HAs    Objective:   Physical Exam BP 110/62 mmHg  Pulse 124  Temp(Src) 98.7 F (37.1 C) (Oral)  Resp 12  Wt 222 lb 9.6 oz (100.971 kg)  SpO2 97% LMP 08/08/2012 Body mass index is 38.19 kg/(m^2). Wt Readings from Last 3 Encounters:  11/22/15 222 lb 9.6 oz (100.971 kg)  09/18/15 217 lb (98.431 kg)  08/08/15 217 lb 9.6 oz (98.703 kg)   Constitutional: overweight, in NAD Eyes: PERRLA, EOMI, no exophthalmos ENT: moist mucous membranes, no thyromegaly, no cervical lymphadenopathy Cardiovascular: tachycardia, RR, No MRG Respiratory: CTA B Gastrointestinal: abdomen soft, NT, ND, BS+ Musculoskeletal: no deformities, strength intact in all 4 Skin: moist, warm, rosacea-like rash on upper chest Neurological: no tremor with outstretched hands, DTR normal in all 4  Assessment:     1. DM2, insulin-dependent, uncontrolled, without complications - r/o autoimmunity Component     Latest Ref Rng 08/17/2012  Glutamic Acid Decarb Ab      <=1.0 U/mL <1.0  Pancreatic Islet Cell Antibody     < 5 JDF Units <5  C-Peptide     0.80 - 3.90 ng/mL 1.84  Glucose     70 - 99 mg/dL 104 (H)     2. Tachycardia  Plan:     1. Patient with long-standing, uncontrolled diabetes, on Metformin + Lantus and now Januvia >> sugars improved after adding Januvia, but they were higher during the time she took steroids. She is not checking sugars at bedtime >> advised to start doing so and to send me sugars in 2 weeks to see if the post-dinner sugars are high. - I suggested to:  Patient Instructions  Please continue: - Lantus 24 units at bedtime - Metformin 1000 mg 2x a day - Januvia 100 mg in am.   Please return in 3 months with your sugar log.   - continue checking sugars at different times of the day - check 2 times a day, rotating checks - UTD with yearly eye exams - checked Hba1c today >> 8.2% (higher!) - Return to clinic in 3 mo with sugar log   2. Tachycardia - persistent - pulse 124 >> 104 at end of appt today - reviewed latest TSH - normal: Lab Results  Component Value Date   TSH 2.40 06/29/2015  - stress test negative

## 2015-11-22 NOTE — Patient Instructions (Signed)
Please continue: - Lantus 24 units at bedtime - Metformin 1000 mg 2x a day - Januvia 100 mg in am.   Please return in 3 months with your sugar log.   Check sugars at bedtime, also, 3x a week.  Send me the sugars ranges in ~2 weeks.

## 2015-12-14 ENCOUNTER — Other Ambulatory Visit: Payer: Self-pay | Admitting: Internal Medicine

## 2015-12-20 ENCOUNTER — Ambulatory Visit (INDEPENDENT_AMBULATORY_CARE_PROVIDER_SITE_OTHER): Payer: BLUE CROSS/BLUE SHIELD | Admitting: Internal Medicine

## 2015-12-20 ENCOUNTER — Other Ambulatory Visit (INDEPENDENT_AMBULATORY_CARE_PROVIDER_SITE_OTHER): Payer: BLUE CROSS/BLUE SHIELD

## 2015-12-20 ENCOUNTER — Encounter: Payer: Self-pay | Admitting: Internal Medicine

## 2015-12-20 VITALS — BP 116/74 | HR 99 | Temp 98.4°F | Resp 16 | Wt 221.0 lb

## 2015-12-20 DIAGNOSIS — Z794 Long term (current) use of insulin: Secondary | ICD-10-CM | POA: Diagnosis not present

## 2015-12-20 DIAGNOSIS — R000 Tachycardia, unspecified: Secondary | ICD-10-CM | POA: Diagnosis not present

## 2015-12-20 DIAGNOSIS — F909 Attention-deficit hyperactivity disorder, unspecified type: Secondary | ICD-10-CM

## 2015-12-20 DIAGNOSIS — E1165 Type 2 diabetes mellitus with hyperglycemia: Secondary | ICD-10-CM

## 2015-12-20 DIAGNOSIS — F329 Major depressive disorder, single episode, unspecified: Secondary | ICD-10-CM

## 2015-12-20 DIAGNOSIS — K219 Gastro-esophageal reflux disease without esophagitis: Secondary | ICD-10-CM

## 2015-12-20 DIAGNOSIS — E1149 Type 2 diabetes mellitus with other diabetic neurological complication: Secondary | ICD-10-CM

## 2015-12-20 DIAGNOSIS — F32A Depression, unspecified: Secondary | ICD-10-CM

## 2015-12-20 DIAGNOSIS — Z23 Encounter for immunization: Secondary | ICD-10-CM

## 2015-12-20 DIAGNOSIS — F419 Anxiety disorder, unspecified: Secondary | ICD-10-CM

## 2015-12-20 LAB — COMPREHENSIVE METABOLIC PANEL
ALT: 28 U/L (ref 0–35)
AST: 21 U/L (ref 0–37)
Albumin: 4.3 g/dL (ref 3.5–5.2)
Alkaline Phosphatase: 122 U/L — ABNORMAL HIGH (ref 39–117)
BUN: 9 mg/dL (ref 6–23)
CHLORIDE: 100 meq/L (ref 96–112)
CO2: 27 meq/L (ref 19–32)
CREATININE: 0.77 mg/dL (ref 0.40–1.20)
Calcium: 9.4 mg/dL (ref 8.4–10.5)
GFR: 89.55 mL/min (ref >60.00)
GLUCOSE: 229 mg/dL — AB (ref 70–99)
POTASSIUM: 4.2 meq/L (ref 3.5–5.1)
SODIUM: 136 meq/L (ref 135–145)
Total Bilirubin: 0.3 mg/dL (ref 0.2–1.2)
Total Protein: 7.8 g/dL (ref 6.0–8.3)

## 2015-12-20 LAB — LIPID PANEL
CHOL/HDL RATIO: 3
Cholesterol: 182 mg/dL (ref 0–200)
HDL: 54.6 mg/dL (ref >39.00)
LDL CALC: 111 mg/dL — AB (ref 0–99)
NONHDL: 127.6
Triglycerides: 81 mg/dL (ref 0.0–149.0)
VLDL: 16.2 mg/dL (ref 0.0–40.0)

## 2015-12-20 LAB — MICROALBUMIN / CREATININE URINE RATIO
Creatinine,U: 239.2 mg/dL
Microalb Creat Ratio: 0.7 mg/g (ref 0.0–30.0)
Microalb, Ur: 1.7 mg/dL (ref 0.0–1.9)

## 2015-12-20 LAB — HEMOGLOBIN A1C: Hgb A1c MFr Bld: 8.6 % — ABNORMAL HIGH (ref 4.6–6.5)

## 2015-12-20 MED ORDER — ALPRAZOLAM 0.5 MG PO TABS: 0.5000 mg | ORAL_TABLET | Freq: Three times a day (TID) | ORAL | Status: DC | PRN

## 2015-12-20 MED ORDER — OMEPRAZOLE 40 MG PO CPDR: 40.0000 mg | DELAYED_RELEASE_CAPSULE | Freq: Every day | ORAL | Status: DC

## 2015-12-20 MED ORDER — AMPHETAMINE-DEXTROAMPHET ER 20 MG PO CP24: 20.0000 mg | ORAL_CAPSULE | Freq: Every day | ORAL | Status: DC

## 2015-12-20 NOTE — Assessment & Plan Note (Signed)
Following with endocrine Reason A1c 8.5%-she is monitoring her sugars and will follow up with endocrine for medication adjustment She is exercising regularly-stressed regular exercise Work on weight loss Will follow with me in 6 months

## 2015-12-20 NOTE — Assessment & Plan Note (Signed)
Stable, controlled Taking medication appropriately, tries to take breaks from medication on the weekends After being off the medication for 2 days she still has an elevated heart rate so this likely is not the cause of her sinus tachycardia Can consider a trial of Strattera Has seen cardiology-no evidence of cardiomyopathy

## 2015-12-20 NOTE — Patient Instructions (Addendum)
  Test(s) ordered today. Your results will be released to [place] (or called to you) after review, usually within 72hours after test completion. If any changes need to be made, you will be notified at that same time.  All other Health Maintenance issues reviewed.   All recommended immunizations and age-appropriate screenings are up-to-date or discussed.  pneumonia vaccine administered today.   Medications reviewed and updated.  No changes recommended at this time.  Your prescription(s) have been submitted to your pharmacy. Please take as directed and contact our office if you believe you are having problem(s) with the medication(s).  Please followup in 6 months

## 2015-12-20 NOTE — Assessment & Plan Note (Signed)
Stable, controlled Continue current dose of citalopram, Lamictal Uses Xanax only as needed

## 2015-12-20 NOTE — Assessment & Plan Note (Signed)
Sinus tachycardia Saw cardiology-no evidence of cardiomyopathy Labs all normal She does not take the Adderall for a couple of days she still has tachycardia believes she had tachycardia before being placed on Adderall Can consider Strattera, but it does not seem that the Adderall is the cause Will monitor

## 2015-12-20 NOTE — Progress Notes (Signed)
Subjective:    Patient ID: Lisa Duran, female    DOB: May 27, 1981, 35 y.o.   MRN: [id number]  HPI She is here for follow up.  Diabetes: She is taking her medication daily as prescribed. She sees endo and her last a1c was 8.5%.  She is compliant with a diabetic diet. She is exercising, But irregularly. She monitors her sugars and they have been running 90-130 in am, 200 in evening. She checks her feet daily and denies foot lesions. She is up-to-date with an ophthalmology examination.   Depression: She is taking her medication daily as prescribed. She denies any side effects from the medication. She feels her depression is well controlled and she is happy with her current dose of medication.   ADD:  She takes the Adderall as prescribed. She tries to live with taking on the weekends she does not needed. When she does not take medication on weekends she still experiences elevated heart rate and wheezing is related to her anxiety. She has never been on Strattera.  GERD:  She is taking her medication daily as prescribed.  She denies any GERD symptoms and feels her GERD is well controlled.    Sinus tachy: She did see cardiology and neuro evaluation was negative for any heart disease. Labs have been normal. She does monitor her heart rate and it is consistently elevated and she recalls it being elevated for years. She denies any chest pain or shortness of breath.   Medications and allergies reviewed with patient and updated if appropriate.  Patient Active Problem List   Diagnosis Date Noted  . Type 2 diabetes mellitus with hyperglycemia, with long-term current use of insulin ([place]) 11/22/2015  . GERD (gastroesophageal reflux disease) 06/24/2015  . Eczema 06/22/2015  . Depression 06/22/2015  . Myofascial pain 06/16/2014  . Tachycardia 06/16/2014  . Chest pain 06/12/2014  . Paresthesias 06/12/2014  . Food allergy   . Wrist pain, right 09/14/2013  . Elbow pain, right 09/05/2013  . ADHD  (attention deficit hyperactivity disorder) 01/27/2011  . Anxiety 02/20/2010  . ALLERGIC RHINITIS 11/21/2009  . OVARIAN CYST 11/21/2009  . Diabetes mellitus type 2 with neurological manifestations ([place]) 11/20/2009  . OBESITY, UNSPECIFIED 11/20/2009  . Migraine without aura 11/20/2009    Current Outpatient Prescriptions on File Prior to Visit  Medication Sig Dispense Refill  . almotriptan (AXERT) 12.5 MG tablet Take 1 tablet (12.5 mg total) by mouth as needed for migraine. may repeat in 2 hours if needed 12 tablet 3  . ALPRAZolam (XANAX) 0.5 MG tablet Take 1 tablet (0.5 mg total) by mouth 3 (three) times daily as needed for anxiety. 30 tablet 1  . amphetamine-dextroamphetamine (ADDERALL XR) 20 MG 24 hr capsule Take 1 capsule (20 mg total) by mouth daily. 30 capsule 0  . citalopram (CELEXA) 20 MG tablet Take 1 tablet by mouth  daily 90 tablet 2  . EPINEPHrine (EPIPEN) 0.3 mg/0.3 mL IJ SOAJ injection Inject 0.3 mLs (0.3 mg total) into the muscle once. 1 Device 1  . fluticasone (FLONASE) 50 MCG/ACT nasal spray Place 2 sprays into both nostrils daily.   1  . glucose blood (TRUETEST TEST) test strip Use as instructed 100 each 3  . Insulin Glargine (LANTUS SOLOSTAR) 100 UNIT/ML Solostar Pen Inject 24 Units into the skin at bedtime. 30 mL 1  . Insulin Pen Needle 32G X 4 MM MISC Use 1x a day 100 each 3  . lamoTRIgine (LAMICTAL) 100 MG tablet Take 1 tablet (100  mg total) by mouth 2 (two) times daily. 180 tablet 1  . levonorgestrel (MIRENA) 20 MCG/24HR IUD 1 Intra Uterine Device (1 each total) by Intrauterine route once. 1 each 0  . metFORMIN (GLUCOPHAGE) 1000 MG tablet Take 1 tablet (1,000 mg total) by mouth 2 (two) times daily with a meal. 180 tablet 1  . omeprazole (PRILOSEC) 40 MG capsule Take 1 capsule (40 mg total) by mouth daily before supper. 90 capsule 3  . Prenatal Vit-Fe Fumarate-FA (MULTIVITAMIN-PRENATAL) 27-0.8 MG TABS tablet Take 1 tablet by mouth daily at 12 noon.    . promethazine  (PHENERGAN) 25 MG tablet Take 25 mg by mouth every 6 (six) hours as needed for nausea.    . sitaGLIPtin (JANUVIA) 100 MG tablet Take 1 tablet (100 mg total) by mouth daily. 90 tablet 3  . traMADol-acetaminophen (ULTRACET) 37.5-325 MG per tablet Take 1 tablet by mouth every 6 (six) hours as needed for pain.    [name] triamcinolone cream (KENALOG) 0.1 % Apply topically 2 (two) times daily as needed. 30 g 1   No current facility-administered medications on file prior to visit.    Past Medical History  Diagnosis Date  . Obesity, unspecified   . MIGRAINE, COMMON   . OVARIAN CYST   . ALLERGIC RHINITIS   . Anemia   . ANXIETY   . DIABETES MELLITUS, TYPE II   . ADHD (attention deficit hyperactivity disorder)   . GERD (gastroesophageal reflux disease)   . Depression   . Allergy     Past Surgical History  Procedure Laterality Date  . No past surgeries      Social History   Social History  . Marital Status: Married    Spouse Name: N/A  . Number of Children: N/A  . Years of Education: N/A   Social History Main Topics  . Smoking status: Former Smoker    Quit date: 08/20/2002  . Smokeless tobacco: Never Used     Comment: single, separated from spouse 02/2010.   [name] Alcohol Use: Yes     Comment: occasionally  . Drug Use: No  . Sexual Activity: Yes    Birth Control/ Protection: IUD   Other Topics Concern  . Not on file   Social History Narrative   Single, separated from spouse 02/2010    Family History  Problem Relation Age of Onset  . Diabetes Mother   . Hyperlipidemia Mother   . Diabetes Father   . Hyperlipidemia Father   . Coronary artery disease Father   . Hypertension Father   . Cancer Father     esoph  . Stroke Father   . Cancer Maternal Grandfather 67    lung, met    Review of Systems  Constitutional: Negative for fever.  Respiratory: Positive for cough (alelrgies). Negative for shortness of breath and wheezing.   Cardiovascular: Positive for palpitations  (regular). Negative for chest pain and leg swelling.  Gastrointestinal: Negative for abdominal pain.  Neurological: Positive for dizziness (allergies). Negative for headaches.  Psychiatric/Behavioral:       Depression and anxiety controlled       Objective:   Filed Vitals:   12/20/15 0817  BP: 116/74  Pulse: 99  Temp: 98.4 F (36.9 C)  Resp: 16   Filed Weights   12/20/15 0817  Weight: 221 lb (100.245 kg)   Body mass index is 37.92 kg/(m^2).   Physical Exam Constitutional: Appears well-developed and well-nourished. No distress.  Neck: Neck supple. No tracheal deviation present. No  thyromegaly present.  No carotid bruit. No cervical adenopathy.   Cardiovascular: Normal rate, regular rhythm and normal heart sounds.   No murmur heard.  No edema Pulmonary/Chest: Effort normal and breath sounds normal. No respiratory distress. No wheezes.        Assessment & Plan:   See Problem List for Assessment and Plan of chronic medical problems.  Follow-up in 6 months

## 2015-12-20 NOTE — Progress Notes (Signed)
Pre visit review using our clinic review tool, if applicable. No additional management support is needed unless otherwise documented below in the visit note. 

## 2015-12-20 NOTE — Assessment & Plan Note (Signed)
Controlled, stable No longer following with psychiatry because she has been stable and I am prescribing her medication Continue current doses of Lamictal and citalopram

## 2015-12-20 NOTE — Assessment & Plan Note (Signed)
GERD controlled Continue daily medication  

## 2015-12-22 ENCOUNTER — Encounter: Payer: Self-pay | Admitting: Internal Medicine

## 2015-12-24 ENCOUNTER — Other Ambulatory Visit: Payer: Self-pay | Admitting: Internal Medicine

## 2015-12-31 ENCOUNTER — Encounter: Payer: Self-pay | Admitting: Internal Medicine

## 2015-12-31 ENCOUNTER — Other Ambulatory Visit: Payer: Self-pay | Admitting: *Deleted

## 2015-12-31 MED ORDER — INSULIN PEN NEEDLE 32G X 4 MM MISC: Status: DC

## 2016-02-12 DIAGNOSIS — S92511A Displaced fracture of proximal phalanx of right lesser toe(s), initial encounter for closed fracture: Secondary | ICD-10-CM | POA: Diagnosis not present

## 2016-02-13 ENCOUNTER — Encounter: Payer: Self-pay | Admitting: Internal Medicine

## 2016-02-14 MED ORDER — AMPHETAMINE-DEXTROAMPHET ER 20 MG PO CP24: 20.0000 mg | ORAL_CAPSULE | Freq: Every day | ORAL | Status: DC

## 2016-02-14 NOTE — Telephone Encounter (Signed)
Prescription printed

## 2016-02-15 NOTE — Telephone Encounter (Signed)
Placed upfront for pick-up, pt notified through mychart.

## 2016-02-19 ENCOUNTER — Ambulatory Visit (INDEPENDENT_AMBULATORY_CARE_PROVIDER_SITE_OTHER): Payer: BLUE CROSS/BLUE SHIELD | Admitting: Family Medicine

## 2016-02-19 ENCOUNTER — Encounter: Payer: Self-pay | Admitting: Family Medicine

## 2016-02-19 VITALS — BP 114/82 | HR 104 | Ht 64.0 in | Wt 215.0 lb

## 2016-02-19 DIAGNOSIS — S92501A Displaced unspecified fracture of right lesser toe(s), initial encounter for closed fracture: Secondary | ICD-10-CM

## 2016-02-19 NOTE — Patient Instructions (Signed)
Good to see you  Ice 20 minutes 2 times daily. Usually after activity and before bed. Rigid sole shoe for 2 weeks and then another 2 weeks in tennis shoes Can try buddy taping in 2 weeks.  Elevate it above heart when sitting Try to avoid too much walking for next 2 weeks (sorry puppy)  Vitamin D 4000IU daily for 2 weeks then 2000IU daily thereafter If not perfect see me again in 4 weeks.

## 2016-02-19 NOTE — Progress Notes (Signed)
[facility] [address], [place] [address] Phone: [phone] Subjective:    CC: right foot pain  [id number] Lisa Duran is a 35 y.o. female coming in with complaint of right foot pain. Patient was moving furniture 5 days ago and hit her pinky toe against something. 5 minutes later started having pain swelling and bruising. Went to urgent care. Patient brought x-rays. Independently visualized by me. Patient was found to have a longitudinal fifth phalanx fracture of the proximal phalanx. No significant displacement.     Past Medical History  Diagnosis Date  . Obesity, unspecified   . MIGRAINE, COMMON   . OVARIAN CYST   . ALLERGIC RHINITIS   . Anemia   . ANXIETY   . DIABETES MELLITUS, TYPE II   . ADHD (attention deficit hyperactivity disorder)   . GERD (gastroesophageal reflux disease)   . Depression   . Allergy    Past Surgical History  Procedure Laterality Date  . No past surgeries     Social History   Social History  . Marital Status: Married    Spouse Name: N/A  . Number of Children: N/A  . Years of Education: N/A   Social History Main Topics  . Smoking status: Former Smoker    Quit date: 08/20/2002  . Smokeless tobacco: Never Used     Comment: single, separated from spouse 02/2010.   [name] Alcohol Use: Yes     Comment: occasionally  . Drug Use: No  . Sexual Activity: Yes    Birth Control/ Protection: IUD   Other Topics Concern  . Not on file   Social History Narrative   Single, separated from spouse 02/2010   Allergies  Allergen Reactions  . Propofol Shortness Of Breath    SOB, chest tightness, wheezing after colonoscopy on 09-18-15  . Chicken Allergy   . Gluten Meal Diarrhea  . Other     Any Nuts   . Victoza [Liraglutide] Rash   Family History  Problem Relation Age of Onset  . Diabetes Mother   . Hyperlipidemia Mother   . Diabetes Father   . Hyperlipidemia Father   . Coronary artery disease Father   .  Hypertension Father   . Cancer Father     esoph  . Stroke Father   . Cancer Maternal Grandfather 7    lung, met    Past medical history, social, surgical and family history all reviewed in electronic medical record.  No pertanent information unless stated regarding to the chief complaint.   Review of Systems: No headache, visual changes, nausea, vomiting, diarrhea, constipation, dizziness, abdominal pain, skin rash, fevers, chills, night sweats, weight loss, swollen lymph nodes, body aches, joint swelling, muscle aches, chest pain, shortness of breath, mood changes.   Objective Blood pressure 114/82, pulse 104, weight 215 lb (97.523 kg), SpO2 97 %.  General: No apparent distress alert and oriented x3 mood and affect normal, dressed appropriately.  HEENT: Pupils equal, extraocular movements intact  Respiratory: Patient's speak in full sentences and does not appear short of breath  Cardiovascular: No lower extremity edema, non tender, no erythema  Skin: Warm dry intact with no signs of infection or rash on extremities or on axial skeleton.  Abdomen: Soft nontender  Neuro: Cranial nerves II through XII are intact, neurovascularly intact in all extremities with 2+ DTRs and 2+ pulses.  Lymph: No lymphadenopathy of posterior or anterior cervical chain or axillae bilaterally.  Gait antalgic gait  MSK:  Non tender with full range of motion and good stability and symmetric strength and tone of shoulders, elbows, wrist, hip, knee and ankles bilaterally.  Foot exam shows the patient is tender to palpation over the fifth phalanx. No crepitus noted. Neurovascularly intact distally. No pain over the fifth metatarsal bone proximal he. Patient has full range of motion of the ankle.    Impression and Recommendations:     This case required medical decision making of moderate complexity.      Note: This dictation was prepared with Dragon dictation along with smaller phrase technology. Any  transcriptional errors that result from this process are unintentional.

## 2016-02-19 NOTE — Progress Notes (Signed)
Pre visit review using our clinic review tool, if applicable. No additional management support is needed unless otherwise documented below in the visit note. 

## 2016-02-19 NOTE — Assessment & Plan Note (Signed)
Patient will continue with a postop boot. We discussed icing. Vitamin D supplementation. We discussed that this will heal appropriately. Follow-up in 4 weeks

## 2016-02-21 ENCOUNTER — Ambulatory Visit: Payer: Self-pay | Admitting: Internal Medicine

## 2016-02-26 DIAGNOSIS — Z01419 Encounter for gynecological examination (general) (routine) without abnormal findings: Secondary | ICD-10-CM | POA: Diagnosis not present

## 2016-02-26 DIAGNOSIS — Z1151 Encounter for screening for human papillomavirus (HPV): Secondary | ICD-10-CM | POA: Diagnosis not present

## 2016-02-26 DIAGNOSIS — Z6836 Body mass index (BMI) 36.0-36.9, adult: Secondary | ICD-10-CM | POA: Diagnosis not present

## 2016-02-29 ENCOUNTER — Ambulatory Visit: Payer: Self-pay | Admitting: Internal Medicine

## 2016-02-29 DIAGNOSIS — Z0289 Encounter for other administrative examinations: Secondary | ICD-10-CM

## 2016-03-15 IMAGING — CR DG CHEST 2V
2 series · 2 of 2 positions shown · non-contrast
Comparison: None.

CLINICAL DATA: One day history of sternal chest pain and tightness

EXAM:
CHEST  2 VIEW

[w chest pa]
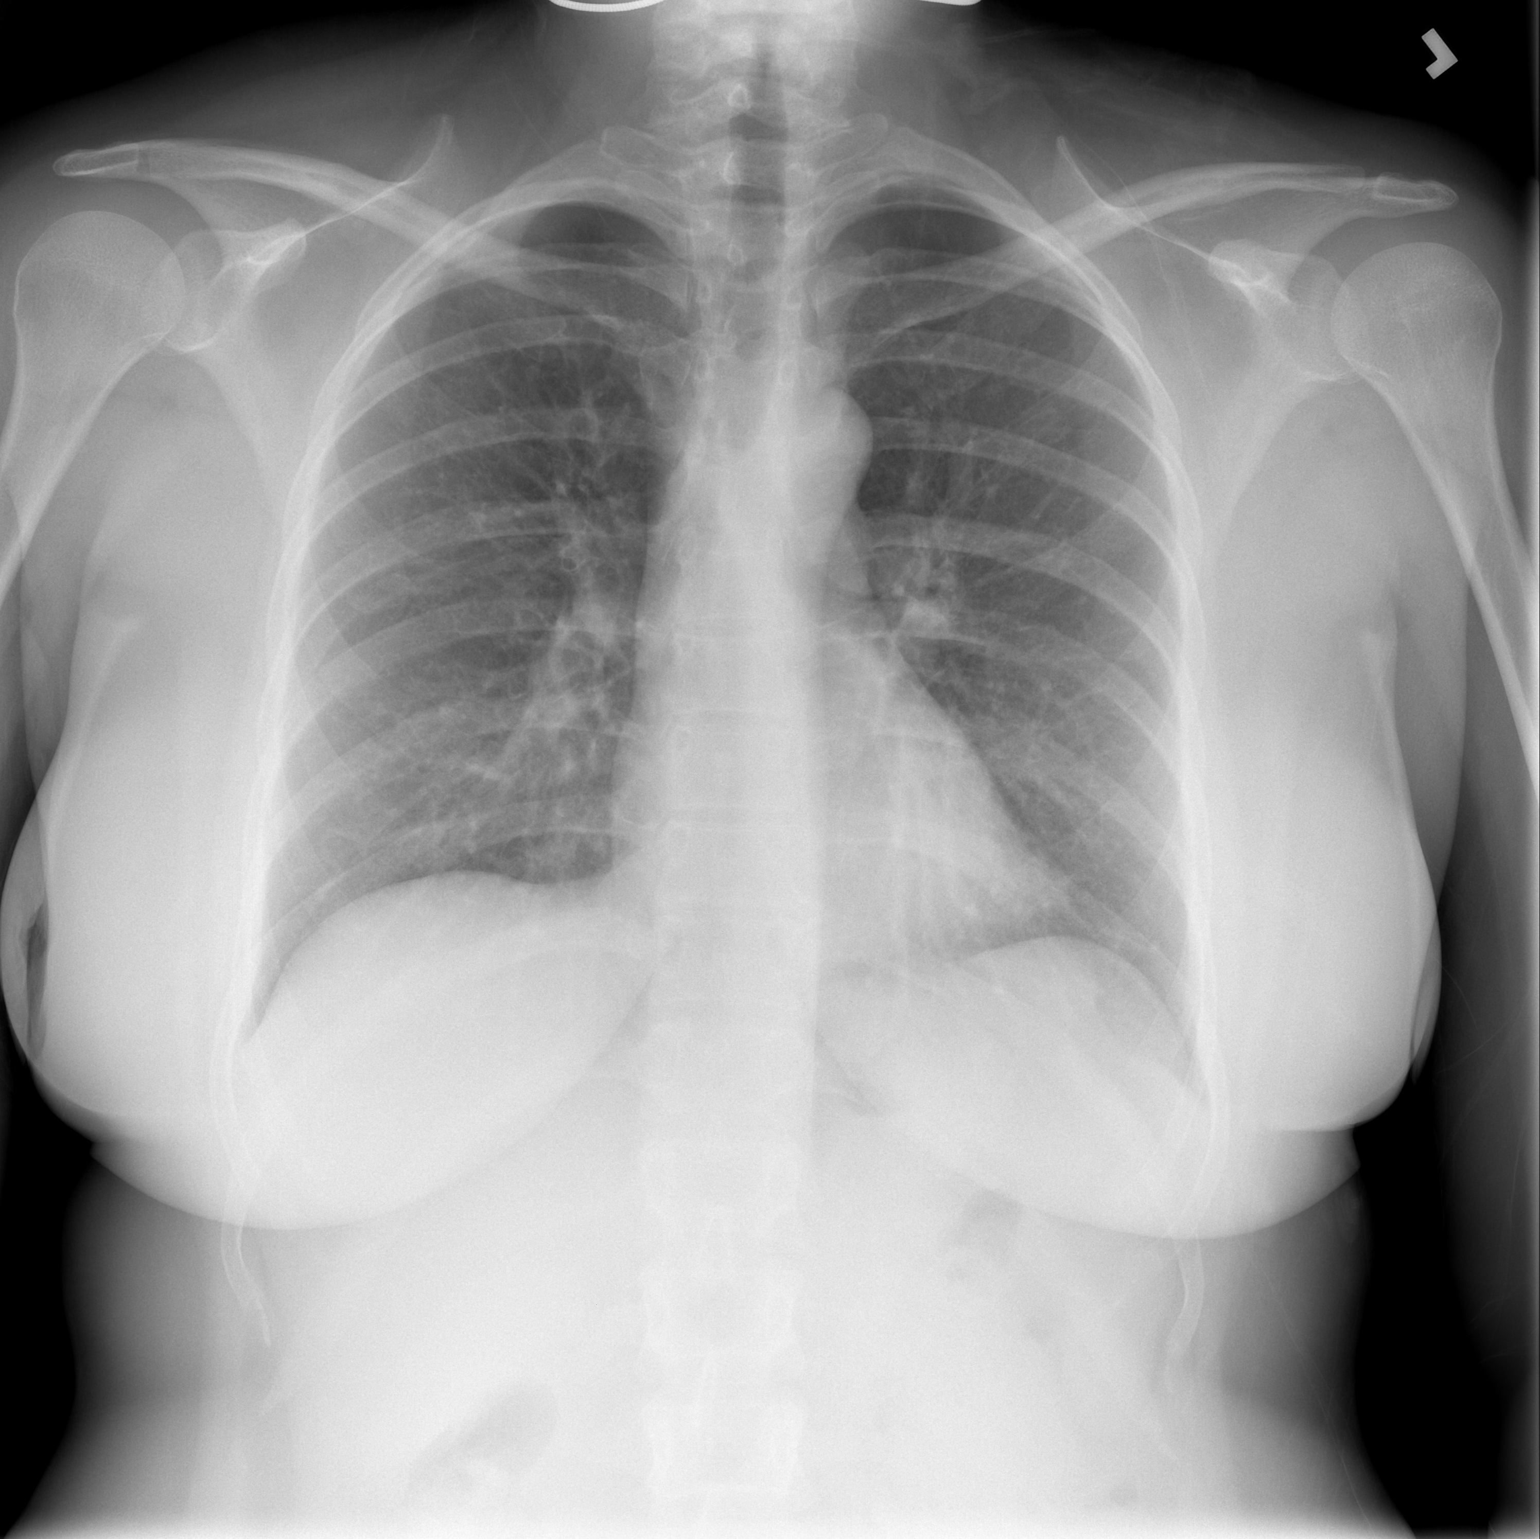

[w chest lat]
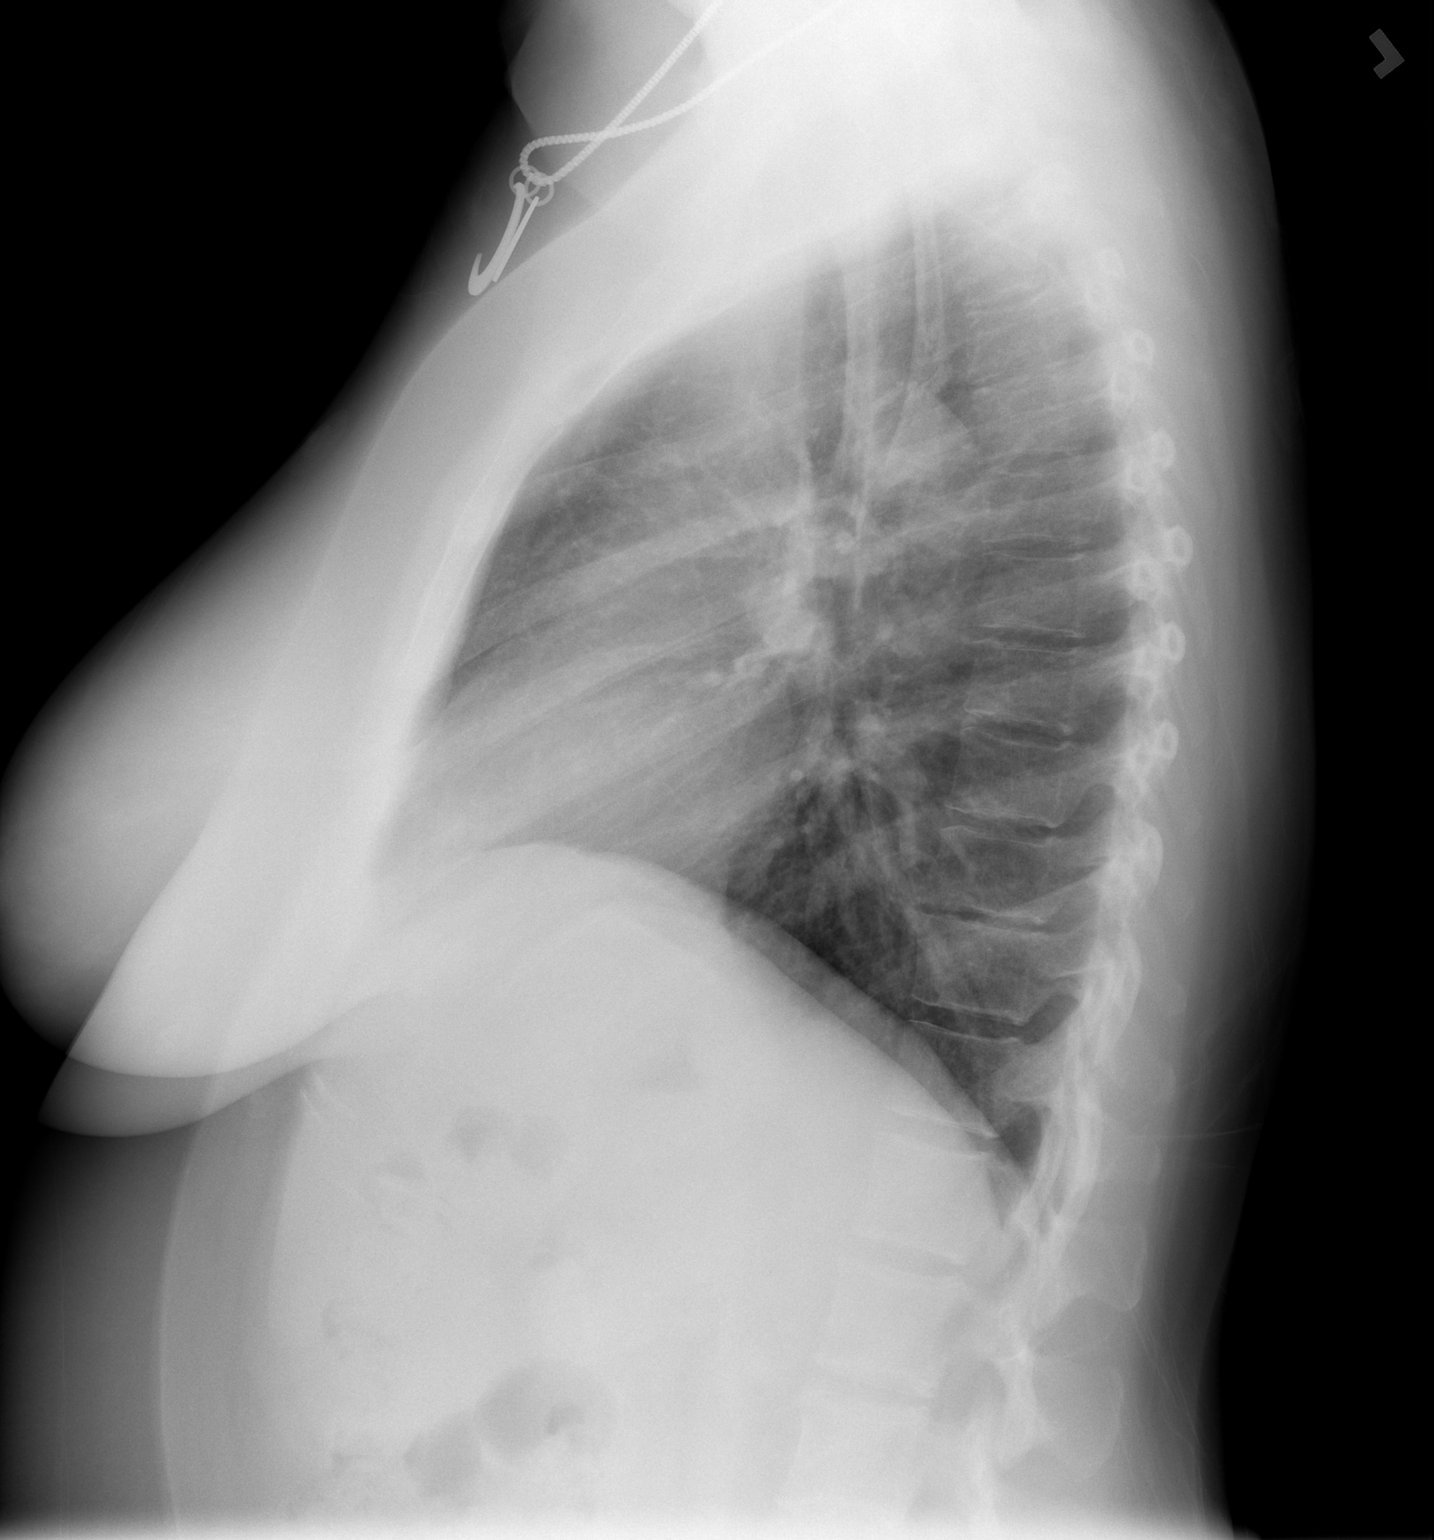

[2 of 2 positions shown; findings below may reference images not displayed]

FINDINGS: Lungs are clear. The heart size and pulmonary vascularity are
normal. No pneumothorax. No adenopathy. No bone lesions.
IMPRESSION: No abnormality noted.

## 2016-04-01 ENCOUNTER — Other Ambulatory Visit: Payer: Self-pay | Admitting: Internal Medicine

## 2016-04-01 ENCOUNTER — Encounter: Payer: Self-pay | Admitting: Internal Medicine

## 2016-04-01 MED ORDER — AMPHETAMINE-DEXTROAMPHET ER 20 MG PO CP24: 20.0000 mg | ORAL_CAPSULE | Freq: Every day | ORAL | 0 refills | Status: DC

## 2016-04-01 NOTE — Telephone Encounter (Signed)
Sent message via mychart informing pt rx was ready for pick up

## 2016-04-01 NOTE — Telephone Encounter (Signed)
Prescription printed

## 2016-04-10 DIAGNOSIS — Z713 Dietary counseling and surveillance: Secondary | ICD-10-CM | POA: Diagnosis not present

## 2016-05-15 DIAGNOSIS — Z713 Dietary counseling and surveillance: Secondary | ICD-10-CM | POA: Diagnosis not present

## 2016-05-22 ENCOUNTER — Encounter: Payer: Self-pay | Admitting: Internal Medicine

## 2016-05-22 MED ORDER — AMPHETAMINE-DEXTROAMPHET ER 20 MG PO CP24: 20.0000 mg | ORAL_CAPSULE | Freq: Every day | ORAL | 0 refills | Status: DC

## 2016-05-22 NOTE — Telephone Encounter (Signed)
Prescription printed

## 2016-06-12 ENCOUNTER — Emergency Department (HOSPITAL_BASED_OUTPATIENT_CLINIC_OR_DEPARTMENT_OTHER)
Admission: EM | Admit: 2016-06-12 | Discharge: 2016-06-12 | Disposition: A | Discharge status: Home / Self Care | Payer: BLUE CROSS/BLUE SHIELD | Attending: Emergency Medicine | Admitting: Emergency Medicine

## 2016-06-12 ENCOUNTER — Encounter (HOSPITAL_BASED_OUTPATIENT_CLINIC_OR_DEPARTMENT_OTHER): Payer: Self-pay | Admitting: *Deleted

## 2016-06-12 DIAGNOSIS — F909 Attention-deficit hyperactivity disorder, unspecified type: Secondary | ICD-10-CM | POA: Insufficient documentation

## 2016-06-12 DIAGNOSIS — T7840XA Allergy, unspecified, initial encounter: Secondary | ICD-10-CM | POA: Diagnosis not present

## 2016-06-12 DIAGNOSIS — Z87891 Personal history of nicotine dependence: Secondary | ICD-10-CM | POA: Insufficient documentation

## 2016-06-12 DIAGNOSIS — E119 Type 2 diabetes mellitus without complications: Secondary | ICD-10-CM | POA: Insufficient documentation

## 2016-06-12 DIAGNOSIS — R1013 Epigastric pain: Secondary | ICD-10-CM | POA: Insufficient documentation

## 2016-06-12 DIAGNOSIS — Z794 Long term (current) use of insulin: Secondary | ICD-10-CM | POA: Diagnosis not present

## 2016-06-12 MED ORDER — FAMOTIDINE 20 MG PO TABS
40.0000 mg | ORAL_TABLET | Freq: Once | ORAL | Status: AC
  Administered 2016-06-12: 40 mg via ORAL
  Filled 2016-06-12: qty 2

## 2016-06-12 MED ORDER — LORAZEPAM 1 MG PO TABS
0.5000 mg | ORAL_TABLET | Freq: Once | ORAL | Status: AC
  Administered 2016-06-12: 0.5 mg via ORAL
  Filled 2016-06-12: qty 1

## 2016-06-12 MED ORDER — PREDNISONE 20 MG PO TABS: ORAL_TABLET | ORAL | 0 refills | Status: DC

## 2016-06-12 MED ORDER — PREDNISONE 50 MG PO TABS
60.0000 mg | ORAL_TABLET | Freq: Once | ORAL | Status: AC
  Administered 2016-06-12: 60 mg via ORAL
  Filled 2016-06-12: qty 1

## 2016-06-12 MED ORDER — SODIUM CHLORIDE 0.9 % IV BOLUS (SEPSIS): 1000.0000 mL | Freq: Once | INTRAVENOUS | Status: DC

## 2016-06-12 MED ORDER — EPINEPHRINE 0.3 MG/0.3ML IJ SOAJ: 0.3000 mg | Freq: Once | INTRAMUSCULAR | 0 refills | Status: AC

## 2016-06-12 MED ORDER — GI COCKTAIL ~~LOC~~
30.0000 mL | Freq: Once | ORAL | Status: AC
  Administered 2016-06-12: 30 mL via ORAL
  Filled 2016-06-12: qty 30

## 2016-06-12 MED FILL — EPINEPHRINE 0.3 MG AUTO-INJ: 0.3 | 30 days supply | Qty: 2 | Fill #0

## 2016-06-12 MED FILL — predniSONE 20 MG TABS: 20 | 4 days supply | Qty: 12 | Fill #0

## 2016-06-12 NOTE — ED Notes (Signed)
Pt. Reports she ate a chef salad and [place] sandwich while out to eat.

## 2016-06-12 NOTE — ED Notes (Signed)
Pt. Able to speak clearly with no drooling noted, no coughing noted.  Pt. Has no gasping with breathing is without difficulty.  Pt. On monitor with no ectopy and rate at 117 ST noted on monitor.

## 2016-06-12 NOTE — ED Notes (Signed)
Pt. Reports she feels her bottom lip is itching and her throat is itching and her tongue is itching.  Pt. Reports she has a lot of allergies to foods.  Pt. Is very anxious.  Dr. Tyrone Nine is at bedside of Pt.  Pt. Is able to speak with no difficulty.  Pt. Sat on RA is 100%

## 2016-06-12 NOTE — ED Notes (Signed)
No wheezing noted upon auscultation.

## 2016-06-12 NOTE — ED Triage Notes (Signed)
States she has a hx of food allergies. She was eating lunch and her throat got tight. Her ears began to itch. She took Benadryl childrens liquid. She is speaking in complete sentences. No hives. Appears anxious.

## 2016-06-12 NOTE — ED Provider Notes (Signed)
[facility] Provider Note   CSN: [id number] Arrival date & time: 06/12/16  1305     History   Chief Complaint Chief Complaint  Patient presents with  . Allergic Reaction    HPI Lisa Duran is a 35 y.o. female.  35 yo F with a chief complaint of an allergic reaction. Patient states that she was having a sandwich and after which she felt that she had a fullness to her throat lower lip tingling and itching to the outside and inside of her neck. She denies any rashes denies shortness of breath denies vomiting diarrhea denies near-syncope. Patient has had multiple episodes like this in the past that she attributed to an allergic reaction. She is allergic to chicken, nuts, gluten. She did obtain some Benadryl and gave her some improvement of symptoms.   The history is provided by the patient.  Allergic Reaction  Presenting symptoms: itching   Presenting symptoms: no wheezing   Severity:  Mild Duration:  1 hour Prior allergic episodes:  Food/nut allergies Relieved by:  Antihistamines Worsened by:  Nothing Ineffective treatments:  None tried Illness  This is a new problem. The current episode started less than 1 hour ago. The problem occurs constantly. The problem has not changed since onset.Pertinent negatives include no chest pain, no headaches and no shortness of breath. Nothing aggravates the symptoms. Nothing relieves the symptoms. She has tried nothing for the symptoms. The treatment provided no relief.    Past Medical History:  Diagnosis Date  . ADHD (attention deficit hyperactivity disorder)   . ALLERGIC RHINITIS   . Allergy   . Anemia   . ANXIETY   . Depression   . DIABETES MELLITUS, TYPE II   . GERD (gastroesophageal reflux disease)   . MIGRAINE, COMMON   . Obesity, unspecified   . OVARIAN CYST     Patient Active Problem List   Diagnosis Date Noted  . Fracture of fifth toe, right, closed 02/19/2016  . Type 2 diabetes mellitus with hyperglycemia,  with long-term current use of insulin ([place]) 11/22/2015  . GERD (gastroesophageal reflux disease) 06/24/2015  . Eczema 06/22/2015  . Depression 06/22/2015  . Myofascial pain 06/16/2014  . Tachycardia 06/16/2014  . Paresthesias 06/12/2014  . Food allergy   . Wrist pain, right 09/14/2013  . Elbow pain, right 09/05/2013  . ADHD (attention deficit hyperactivity disorder) 01/27/2011  . Anxiety 02/20/2010  . ALLERGIC RHINITIS 11/21/2009  . OVARIAN CYST 11/21/2009  . Diabetes mellitus type 2 with neurological manifestations ([place]) 11/20/2009  . OBESITY, UNSPECIFIED 11/20/2009  . Migraine without aura 11/20/2009    Past Surgical History:  Procedure Laterality Date  . NO PAST SURGERIES      OB History    No data available       Home Medications    Prior to Admission medications   Medication Sig Start Date End Date Taking? Authorizing Provider  almotriptan (AXERT) 12.5 MG tablet Take 1 tablet (12.5 mg total) by mouth as needed for migraine. may repeat in 2 hours if needed 12/19/14   Hoyt Koch, MD  ALPRAZolam Duanne Moron) 0.5 MG tablet Take 1 tablet (0.5 mg total) by mouth 3 (three) times daily as needed for anxiety. 12/20/15   Binnie Rail, MD  amphetamine-dextroamphetamine (ADDERALL XR) 20 MG 24 hr capsule Take 1 capsule (20 mg total) by mouth daily. 05/22/16   Binnie Rail, MD  citalopram (CELEXA) 20 MG tablet Take 1 tablet by mouth  daily 12/14/15  Binnie Rail, MD  EPINEPHrine (EPIPEN) 0.3 mg/0.3 mL IJ SOAJ injection Inject 0.3 mLs (0.3 mg total) into the muscle once. 06/12/16 06/12/16  Deno Etienne, DO  fluticasone (FLONASE) 50 MCG/ACT nasal spray Place 2 sprays into both nostrils daily.  09/27/14   Historical Provider, MD  glucose blood (TRUETEST TEST) test strip Use as instructed 12/19/14   Hoyt Koch, MD  Insulin Glargine (LANTUS SOLOSTAR) 100 UNIT/ML Solostar Pen Inject 24 Units into the skin at bedtime. 08/21/15   Philemon Kingdom, MD  Insulin Pen Needle 32G X 4 MM  MISC Use 1x a day 12/31/15   Philemon Kingdom, MD  lamoTRIgine (LAMICTAL) 100 MG tablet Take 1 tablet (100 mg total) by mouth 2 (two) times daily. 06/22/15   Binnie Rail, MD  levonorgestrel (MIRENA) 20 MCG/24HR IUD 1 Intra Uterine Device (1 each total) by Intrauterine route once. 10/03/13   Rowe Clack, MD  metFORMIN (GLUCOPHAGE) 1000 MG tablet TAKE 1/2 TABLET BY MOUTH  TWICE A DAY WITH A MEAL 12/24/15   Binnie Rail, MD  omeprazole (PRILOSEC) 40 MG capsule Take 1 capsule (40 mg total) by mouth daily before supper. 12/20/15   Binnie Rail, MD  predniSONE (DELTASONE) 20 MG tablet 3 tabs po daily x 4 days 06/12/16   Deno Etienne, DO  Prenatal Vit-Fe Fumarate-FA (MULTIVITAMIN-PRENATAL) 27-0.8 MG TABS tablet Take 1 tablet by mouth daily at 12 noon.    Historical Provider, MD  promethazine (PHENERGAN) 25 MG tablet Take 25 mg by mouth every 6 (six) hours as needed for nausea. 01/27/11   Rowe Clack, MD  sitaGLIPtin (JANUVIA) 100 MG tablet Take 1 tablet (100 mg total) by mouth daily. 11/22/15   Philemon Kingdom, MD  traMADol-acetaminophen (ULTRACET) 37.5-325 MG per tablet Take 1 tablet by mouth every 6 (six) hours as needed for pain. 01/26/12   Rowe Clack, MD  triamcinolone cream (KENALOG) 0.1 % Apply topically 2 (two) times daily as needed. 06/20/14   Rowe Clack, MD    Family History Family History  Problem Relation Age of Onset  . Diabetes Mother   . Hyperlipidemia Mother   . Diabetes Father   . Hyperlipidemia Father   . Coronary artery disease Father   . Hypertension Father   . Cancer Father     esoph  . Stroke Father   . Cancer Maternal Grandfather 29    lung, met    Social History Social History  Substance Use Topics  . Smoking status: Former Smoker    Quit date: 08/20/2002  . Smokeless tobacco: Never Used     Comment: single, separated from spouse 02/2010.   [name] Alcohol use Yes     Comment: occasionally     Allergies   Propofol; Chicken allergy; Gluten  meal; Other; and Victoza [liraglutide]   Review of Systems Review of Systems  Constitutional: Negative for chills and fever.  HENT: Negative for congestion and rhinorrhea.   Eyes: Negative for redness and visual disturbance.  Respiratory: Negative for shortness of breath and wheezing.   Cardiovascular: Negative for chest pain and palpitations.  Gastrointestinal: Negative for nausea and vomiting.  Genitourinary: Negative for dysuria and urgency.  Musculoskeletal: Negative for arthralgias and myalgias.  Skin: Positive for itching. Negative for pallor and wound.  Neurological: Positive for numbness. Negative for dizziness and headaches.       Itching      Physical Exam Updated Vital Signs BP 109/82   Pulse 109   Temp  32 F (36.7 C) (Oral)   Resp 14   Ht '5\' 4"'  (1.626 m)   Wt 215 lb (97.5 kg)   SpO2 98%   BMI 36.90 kg/m   Physical Exam  Constitutional: She is oriented to person, place, and time. She appears well-developed and well-nourished. No distress.  HENT:  Head: Normocephalic and atraumatic.  Eyes: EOM are normal. Pupils are equal, round, and reactive to light.  Neck: Normal range of motion. Neck supple.  Cardiovascular: Regular rhythm.  Tachycardia present.  Exam reveals no gallop and no friction rub.   No murmur heard. Pulmonary/Chest: Effort normal. She has no wheezes. She has no rales.  Abdominal: Soft. She exhibits no distension and no mass. There is tenderness (mild epigastric). There is no guarding.  Musculoskeletal: She exhibits no edema or tenderness.  Neurological: She is alert and oriented to person, place, and time.  Skin: Skin is warm and dry. She is not diaphoretic.  Psychiatric: She has a normal mood and affect. Her behavior is normal.  Nursing note and vitals reviewed.    ED Treatments / Results  Labs (all labs ordered are listed, but only abnormal results are displayed) Labs Reviewed - No data to display  EKG  EKG  Interpretation  Date/Time:  Thursday June 12 2016 13:31:59 EDT Ventricular Rate:  115 PR Interval:    QRS Duration: 76 QT Interval:  382 QTC Calculation: 529 R Axis:   34 Text Interpretation:  Sinus tachycardia Low voltage, precordial leads Borderline T abnormalities, anterior leads Prolonged QT interval No significant change since last tracing Confirmed by Porschia Willbanks MD, Quillian Quince ([id number]) on 06/12/2016 1:38:36 PM Also confirmed by Tyrone Nine MD, DANIEL [phone]), editor Rolla Plate, Joelene Millin [phone])  on 06/12/2016 2:00:31 PM       Radiology No results found.  Procedures Procedures (including critical care time)  Medications Ordered in ED Medications  LORazepam (ATIVAN) tablet 0.5 mg (0.5 mg Oral Given 06/12/16 1336)  famotidine (PEPCID) tablet 40 mg (40 mg Oral Given 06/12/16 1335)  predniSONE (DELTASONE) tablet 60 mg (60 mg Oral Given 06/12/16 1335)  gi cocktail (Maalox,Lidocaine,Donnatal) (30 mLs Oral Given 06/12/16 1336)     Initial Impression / Assessment and Plan / ED Course  I have reviewed the triage vital signs and the nursing notes.  Pertinent labs & imaging results that were available during my care of the patient were reviewed by me and considered in my medical decision making (see chart for details).  Clinical Course    35 yo F With a chief complaint of an allergic reaction. Patient has a history of the same. She has some tachycardia on arrival. Im not sure if part of this is anxiety related. Will treat symptomatically. Observed in the ED. EKG for tachycardia.  Sinus tach, symptoms improved with meds.  Discussed with patient who feels that she can observe her symptoms at home.   3:02 PM:  I have discussed the diagnosis/risks/treatment options with the patient and family and believe the pt to be eligible for discharge home to follow-up with PCP. We also discussed returning to the ED immediately if new or worsening sx occur. We discussed the sx which are most concerning (e.g., sob, hive,  vomiting, diarrhea, syncope) that necessitate immediate return. Medications administered to the patient during their visit and any new prescriptions provided to the patient are listed below.  Medications given during this visit Medications  LORazepam (ATIVAN) tablet 0.5 mg (0.5 mg Oral Given 06/12/16 1336)  famotidine (PEPCID) tablet 40 mg (40  mg Oral Given 06/12/16 1335)  predniSONE (DELTASONE) tablet 60 mg (60 mg Oral Given 06/12/16 1335)  gi cocktail (Maalox,Lidocaine,Donnatal) (30 mLs Oral Given 06/12/16 1336)     The patient appears reasonably screen and/or stabilized for discharge and I doubt any other medical condition or other [facility] requiring further screening, evaluation, or treatment in the ED at this time prior to discharge.    Final Clinical Impressions(s) / ED Diagnoses   Final diagnoses:  Allergic reaction, initial encounter    New Prescriptions New Prescriptions   PREDNISONE (DELTASONE) 20 MG TABLET    3 tabs po daily x 4 days     Deno Etienne, DO 06/12/16 1502

## 2016-06-12 NOTE — Discharge Instructions (Signed)
Take benadryl 50 mg 4 x a day for the next couple of days.

## 2016-06-19 DIAGNOSIS — Z713 Dietary counseling and surveillance: Secondary | ICD-10-CM | POA: Diagnosis not present

## 2016-06-22 NOTE — Assessment & Plan Note (Signed)
Management per Dr Gherghe 

## 2016-06-22 NOTE — Progress Notes (Signed)
Subjective:    Patient ID: Lisa Duran, female    DOB: 06/13/1981, 35 y.o.   MRN: [id number]  HPI No show   Medications and allergies reviewed with patient and updated if appropriate.  Patient Active Problem List   Diagnosis Date Noted  . Fracture of fifth toe, right, closed 02/19/2016  . Type 2 diabetes mellitus with hyperglycemia, with long-term current use of insulin ([place]) 11/22/2015  . GERD (gastroesophageal reflux disease) 06/24/2015  . Eczema 06/22/2015  . Depression 06/22/2015  . Myofascial pain 06/16/2014  . Tachycardia 06/16/2014  . Paresthesias 06/12/2014  . Food allergy   . Wrist pain, right 09/14/2013  . Elbow pain, right 09/05/2013  . ADHD (attention deficit hyperactivity disorder) 01/27/2011  . Anxiety 02/20/2010  . ALLERGIC RHINITIS 11/21/2009  . OVARIAN CYST 11/21/2009  . Diabetes mellitus type 2 with neurological manifestations ([place]) 11/20/2009  . OBESITY, UNSPECIFIED 11/20/2009  . Migraine without aura 11/20/2009    Current Outpatient Prescriptions on File Prior to Visit  Medication Sig Dispense Refill  . almotriptan (AXERT) 12.5 MG tablet Take 1 tablet (12.5 mg total) by mouth as needed for migraine. may repeat in 2 hours if needed 12 tablet 3  . ALPRAZolam (XANAX) 0.5 MG tablet Take 1 tablet (0.5 mg total) by mouth 3 (three) times daily as needed for anxiety. 30 tablet 5  . amphetamine-dextroamphetamine (ADDERALL XR) 20 MG 24 hr capsule Take 1 capsule (20 mg total) by mouth daily. 30 capsule 0  . citalopram (CELEXA) 20 MG tablet Take 1 tablet by mouth  daily 90 tablet 2  . fluticasone (FLONASE) 50 MCG/ACT nasal spray Place 2 sprays into both nostrils daily.   1  . glucose blood (TRUETEST TEST) test strip Use as instructed 100 each 3  . Insulin Glargine (LANTUS SOLOSTAR) 100 UNIT/ML Solostar Pen Inject 24 Units into the skin at bedtime. 30 mL 1  . Insulin Pen Needle 32G X 4 MM MISC Use 1x a day 100 each 3  . lamoTRIgine (LAMICTAL) 100 MG tablet Take 1  tablet (100 mg total) by mouth 2 (two) times daily. 180 tablet 1  . levonorgestrel (MIRENA) 20 MCG/24HR IUD 1 Intra Uterine Device (1 each total) by Intrauterine route once. 1 each 0  . metFORMIN (GLUCOPHAGE) 1000 MG tablet TAKE 1/2 TABLET BY MOUTH  TWICE A DAY WITH A MEAL 90 tablet 1  . omeprazole (PRILOSEC) 40 MG capsule Take 1 capsule (40 mg total) by mouth daily before supper. 90 capsule 3  . predniSONE (DELTASONE) 20 MG tablet 3 tabs po daily x 4 days 12 tablet 0  . Prenatal Vit-Fe Fumarate-FA (MULTIVITAMIN-PRENATAL) 27-0.8 MG TABS tablet Take 1 tablet by mouth daily at 12 noon.    . promethazine (PHENERGAN) 25 MG tablet Take 25 mg by mouth every 6 (six) hours as needed for nausea.    . sitaGLIPtin (JANUVIA) 100 MG tablet Take 1 tablet (100 mg total) by mouth daily. 90 tablet 3  . traMADol-acetaminophen (ULTRACET) 37.5-325 MG per tablet Take 1 tablet by mouth every 6 (six) hours as needed for pain.    [name] triamcinolone cream (KENALOG) 0.1 % Apply topically 2 (two) times daily as needed. 30 g 1   No current facility-administered medications on file prior to visit.     Past Medical History:  Diagnosis Date  . ADHD (attention deficit hyperactivity disorder)   . ALLERGIC RHINITIS   . Allergy   . Anemia   . ANXIETY   . Depression   .  DIABETES MELLITUS, TYPE II   . GERD (gastroesophageal reflux disease)   . MIGRAINE, COMMON   . Obesity, unspecified   . OVARIAN CYST     Past Surgical History:  Procedure Laterality Date  . NO PAST SURGERIES      Social History   Social History  . Marital status: Married    Spouse name: N/A  . Number of children: N/A  . Years of education: N/A   Social History Main Topics  . Smoking status: Former Smoker    Quit date: 08/20/2002  . Smokeless tobacco: Never Used     Comment: single, separated from spouse 02/2010.   [name] Alcohol use Yes     Comment: occasionally  . Drug use: No  . Sexual activity: Yes    Birth control/ protection: IUD   Other  Topics Concern  . Not on file   Social History Narrative   Single, separated from spouse 02/2010    Family History  Problem Relation Age of Onset  . Diabetes Mother   . Hyperlipidemia Mother   . Diabetes Father   . Hyperlipidemia Father   . Coronary artery disease Father   . Hypertension Father   . Cancer Father     esoph  . Stroke Father   . Cancer Maternal Grandfather 79    lung, met    Review of Systems     Objective:  There were no vitals filed for this visit. There were no vitals filed for this visit. There is no height or weight on file to calculate BMI.   Physical Exam         Assessment & Plan:       This encounter was created in error - please disregard.

## 2016-06-23 ENCOUNTER — Encounter: Payer: BLUE CROSS/BLUE SHIELD | Admitting: Internal Medicine

## 2016-07-01 ENCOUNTER — Encounter: Payer: Self-pay | Admitting: Internal Medicine

## 2016-07-02 MED ORDER — AMPHETAMINE-DEXTROAMPHET ER 20 MG PO CP24: 20.0000 mg | ORAL_CAPSULE | Freq: Every day | ORAL | 0 refills | Status: DC

## 2016-07-02 NOTE — Telephone Encounter (Signed)
Done hardcopy to Corinne  

## 2016-07-02 NOTE — Telephone Encounter (Signed)
Would you be willing to fill this as Dr Quay Burow is out of the office. Last fill was 05/22/16. Last OV was 12/20/15, pt has appt with Dr Quay Burow to follow-up next month.

## 2016-07-08 ENCOUNTER — Ambulatory Visit: Payer: Self-pay | Admitting: Internal Medicine

## 2016-07-16 ENCOUNTER — Other Ambulatory Visit: Payer: Self-pay | Admitting: Internal Medicine

## 2016-07-17 NOTE — Telephone Encounter (Signed)
RX faxed to [facility] 

## 2016-07-25 DIAGNOSIS — Z713 Dietary counseling and surveillance: Secondary | ICD-10-CM | POA: Diagnosis not present

## 2016-07-31 ENCOUNTER — Encounter: Payer: Self-pay | Admitting: Internal Medicine

## 2016-07-31 ENCOUNTER — Other Ambulatory Visit: Payer: Self-pay

## 2016-07-31 MED ORDER — GLUCOSE BLOOD VI STRP: ORAL_STRIP | 12 refills | Status: DC

## 2016-08-01 ENCOUNTER — Encounter: Payer: Self-pay | Admitting: Internal Medicine

## 2016-08-01 ENCOUNTER — Ambulatory Visit (INDEPENDENT_AMBULATORY_CARE_PROVIDER_SITE_OTHER): Payer: BLUE CROSS/BLUE SHIELD | Admitting: Internal Medicine

## 2016-08-01 VITALS — BP 120/72 | HR 122 | Temp 98.0°F | Ht 64.0 in | Wt 211.5 lb

## 2016-08-01 DIAGNOSIS — G43009 Migraine without aura, not intractable, without status migrainosus: Secondary | ICD-10-CM

## 2016-08-01 DIAGNOSIS — F909 Attention-deficit hyperactivity disorder, unspecified type: Secondary | ICD-10-CM

## 2016-08-01 DIAGNOSIS — F329 Major depressive disorder, single episode, unspecified: Secondary | ICD-10-CM

## 2016-08-01 DIAGNOSIS — Z23 Encounter for immunization: Secondary | ICD-10-CM

## 2016-08-01 DIAGNOSIS — F32A Depression, unspecified: Secondary | ICD-10-CM

## 2016-08-01 DIAGNOSIS — E1149 Type 2 diabetes mellitus with other diabetic neurological complication: Secondary | ICD-10-CM | POA: Diagnosis not present

## 2016-08-01 DIAGNOSIS — K219 Gastro-esophageal reflux disease without esophagitis: Secondary | ICD-10-CM

## 2016-08-01 DIAGNOSIS — F419 Anxiety disorder, unspecified: Secondary | ICD-10-CM

## 2016-08-01 MED ORDER — ATOMOXETINE HCL 40 MG PO CAPS: 40.0000 mg | ORAL_CAPSULE | Freq: Every day | ORAL | 5 refills | Status: DC

## 2016-08-01 NOTE — Assessment & Plan Note (Addendum)
Follows with Dr Cruzita Lederer Seeing nutrition - doing well with diet Exercising - walking dog Having stabing pain in legs, no numbness/tingling in feet Jan - has eye exam Sees Dr Cruzita Lederer Will defer a1c until next month

## 2016-08-01 NOTE — Assessment & Plan Note (Signed)
Controlled, stable Continue current dose of medication  

## 2016-08-01 NOTE — Patient Instructions (Signed)
  All other Health Maintenance issues reviewed.   All recommended immunizations and age-appropriate screenings are up-to-date or discussed.  No immunizations administered today.   Medications reviewed and updated.  Changes include starting strattera and stopping adderall.   Your prescription(s) have been submitted to your pharmacy. Please take as directed and contact our office if you believe you are having problem(s) with the medication(s).   Please followup in 6 months

## 2016-08-01 NOTE — Assessment & Plan Note (Signed)
GERD controlled Continue daily medication  

## 2016-08-01 NOTE — Progress Notes (Signed)
Subjective:    Patient ID: Lisa Duran, female    DOB: June 05, 1981, 35 y.o.   MRN: [id number]  HPI The patient is here for follow up.  ADD:  She is taking her medication as prescribed.  She feels the medication is effective.  She does have persistent tachycardia.  Cardiology evaluation last year was normal/negative.  She does feel her HR being elevated at times.    Anxiety, depression: She is taking her medication daily as prescribed. She denies any side effects from the medication. She feels her anxiety and depression are well controlled and she is happy with her current dose of medication.   Diabetes: She sees Dr Cruzita Lederer.  She is taking her medication daily as prescribed, but did go without her medication for a little bit. She is compliant with a diabetic diet. She is working with a Engineer, maintenance (IT).  She is exercising regularly. She monitors her sugars and they have been running > 200 this morning, sugars about 130 typically. She checks her feet daily and denies foot lesions. She is up-to-date with an ophthalmology examination.   GERD:  She is taking her medication daily as prescribed.  She denies any GERD symptoms and feels her GERD is well controlled.   Migraine headaches:  Her headaches have imporved with diet changes. Getting migraines about one a month.   Allergic reaction:  Last month she went to the ED with an allergic reaction.  She had SOB, chest tightness, lip tingling.  She ended up going to ED.  She is allergic to chicken and this was similar.  She had [place] at lunch that day and is not sure if she is now allergic to [place] or if chicken was in the salad.  She has an Epi pen  Medications and allergies reviewed with patient and updated if appropriate.  Patient Active Problem List   Diagnosis Date Noted  . Fracture of fifth toe, right, closed 02/19/2016  . GERD (gastroesophageal reflux disease) 06/24/2015  . Eczema 06/22/2015  . Depression 06/22/2015  . Myofascial pain  06/16/2014  . Tachycardia 06/16/2014  . Paresthesias 06/12/2014  . Food allergy   . Wrist pain, right 09/14/2013  . Elbow pain, right 09/05/2013  . ADHD (attention deficit hyperactivity disorder) 01/27/2011  . Anxiety 02/20/2010  . ALLERGIC RHINITIS 11/21/2009  . OVARIAN CYST 11/21/2009  . Diabetes mellitus type 2 with neurological manifestations ([place]) 11/20/2009  . OBESITY, UNSPECIFIED 11/20/2009  . Migraine without aura 11/20/2009    Current Outpatient Prescriptions on File Prior to Visit  Medication Sig Dispense Refill  . almotriptan (AXERT) 12.5 MG tablet Take 1 tablet (12.5 mg total) by mouth as needed for migraine. may repeat in 2 hours if needed 12 tablet 3  . ALPRAZolam (XANAX) 0.5 MG tablet TAKE 1 TABLET BY MOUTH THREE TIMES DAILY AS NEEDED FOR ANXIETY 30 tablet 5  . amphetamine-dextroamphetamine (ADDERALL XR) 20 MG 24 hr capsule Take 1 capsule (20 mg total) by mouth daily. 30 capsule 0  . citalopram (CELEXA) 20 MG tablet Take 1 tablet by mouth  daily 90 tablet 2  . fluticasone (FLONASE) 50 MCG/ACT nasal spray Place 2 sprays into both nostrils daily.   1  . glucose blood (ONETOUCH VERIO) test strip Use as instructed 100 each 12  . Insulin Glargine (LANTUS SOLOSTAR) 100 UNIT/ML Solostar Pen Inject 24 Units into the skin at bedtime. 30 mL 1  . Insulin Pen Needle 32G X 4 MM MISC Use 1x a day 100 each 3  .  lamoTRIgine (LAMICTAL) 100 MG tablet Take 1 tablet (100 mg total) by mouth 2 (two) times daily. 180 tablet 1  . levonorgestrel (MIRENA) 20 MCG/24HR IUD 1 Intra Uterine Device (1 each total) by Intrauterine route once. 1 each 0  . metFORMIN (GLUCOPHAGE) 1000 MG tablet TAKE 1/2 TABLET BY MOUTH  TWICE A DAY WITH A MEAL 90 tablet 1  . omeprazole (PRILOSEC) 40 MG capsule Take 1 capsule (40 mg total) by mouth daily before supper. 90 capsule 3  . Prenatal Vit-Fe Fumarate-FA (MULTIVITAMIN-PRENATAL) 27-0.8 MG TABS tablet Take 1 tablet by mouth daily at 12 noon.    . promethazine  (PHENERGAN) 25 MG tablet Take 25 mg by mouth every 6 (six) hours as needed for nausea.    . sitaGLIPtin (JANUVIA) 100 MG tablet Take 1 tablet (100 mg total) by mouth daily. 90 tablet 3  . traMADol-acetaminophen (ULTRACET) 37.5-325 MG per tablet Take 1 tablet by mouth every 6 (six) hours as needed for pain.    . triamcinolone cream (KENALOG) 0.1 % Apply topically 2 (two) times daily as needed. 30 g 1   No current facility-administered medications on file prior to visit.     Past Medical History:  Diagnosis Date  . ADHD (attention deficit hyperactivity disorder)   . ALLERGIC RHINITIS   . Allergy   . Anemia   . ANXIETY   . Depression   . DIABETES MELLITUS, TYPE II   . GERD (gastroesophageal reflux disease)   . MIGRAINE, COMMON   . Obesity, unspecified   . OVARIAN CYST     Past Surgical History:  Procedure Laterality Date  . NO PAST SURGERIES      Social History   Social History  . Marital status: Married    Spouse name: N/A  . Number of children: N/A  . Years of education: N/A   Social History Main Topics  . Smoking status: Former Smoker    Quit date: 08/20/2002  . Smokeless tobacco: Never Used     Comment: single, separated from spouse 02/2010.   . Alcohol use Yes     Comment: occasionally  . Drug use: No  . Sexual activity: Yes    Birth control/ protection: IUD   Other Topics Concern  . Not on file   Social History Narrative   Single, separated from spouse 02/2010    Family History  Problem Relation Age of Onset  . Diabetes Mother   . Hyperlipidemia Mother   . Diabetes Father   . Hyperlipidemia Father   . Coronary artery disease Father   . Hypertension Father   . Cancer Father     esoph  . Stroke Father   . Cancer Maternal Grandfather 85    lung, met    Review of Systems  Constitutional: Negative for chills and fever.  Eyes: Positive for visual disturbance (blurry vision - ? inc sugars).  Respiratory: Negative for cough, shortness of breath and  wheezing.   Cardiovascular: Positive for palpitations (occ) and leg swelling (feet and ankles only). Negative for chest pain.  Gastrointestinal: Negative for abdominal pain.       GERD controlled  Neurological: Positive for light-headedness (occ - not sugar related) and headaches (occ).  Psychiatric/Behavioral: Negative for dysphoric mood, sleep disturbance and suicidal ideas. The patient is not nervous/anxious.        Objective:   Vitals:   08/01/16 1430  BP: 120/72  Pulse: (!) 122  Temp: 98 F (36.7 C)   Filed Weights     08/01/16 1430  Weight: 211 lb 8 oz (95.9 kg)   Body mass index is 36.3 kg/m.   Physical Exam    Constitutional: Appears well-developed and well-nourished. No distress.  HENT:  Head: Normocephalic and atraumatic.  Neck: Neck supple. No tracheal deviation present. No thyromegaly present.  No cervical lymphadenopathy Cardiovascular: tachycardic, regular rhythm and normal heart sounds.   No murmur heard. No carotid bruit .  No edema Pulmonary/Chest: Effort normal and breath sounds normal. No respiratory distress. No has no wheezes. No rales.  Skin: Skin is warm and dry. Not diaphoretic.  Psychiatric: Normal mood and affect. Behavior is normal.      Assessment & Plan:    See Problem List for Assessment and Plan of chronic medical problems.   

## 2016-08-01 NOTE — Assessment & Plan Note (Signed)
ADD controlled but having tachycardia Stop adderall  Start Strattera - will start at 40 mg daily - will increase if needed Follow up in 6 months

## 2016-08-01 NOTE — Assessment & Plan Note (Addendum)
Improved with diet changes Continue phenergan / axert prn, ultracet prn

## 2016-08-05 ENCOUNTER — Other Ambulatory Visit: Payer: Self-pay | Admitting: Internal Medicine

## 2016-08-11 DIAGNOSIS — M797 Fibromyalgia: Secondary | ICD-10-CM

## 2016-08-11 HISTORY — DX: Fibromyalgia: M79.7

## 2016-08-14 ENCOUNTER — Encounter: Payer: Self-pay | Admitting: Internal Medicine

## 2016-08-15 MED ORDER — ATOMOXETINE HCL 60 MG PO CAPS
60.0000 mg | ORAL_CAPSULE | Freq: Every day | ORAL | 0 refills | Status: DC
Start: 2016-08-15 — End: 2016-10-20

## 2016-08-19 ENCOUNTER — Encounter: Payer: Self-pay | Admitting: Internal Medicine

## 2016-08-19 ENCOUNTER — Ambulatory Visit (INDEPENDENT_AMBULATORY_CARE_PROVIDER_SITE_OTHER): Payer: BLUE CROSS/BLUE SHIELD | Admitting: Internal Medicine

## 2016-08-19 VITALS — BP 112/74 | HR 70 | Ht 64.0 in | Wt 212.0 lb

## 2016-08-19 DIAGNOSIS — E1149 Type 2 diabetes mellitus with other diabetic neurological complication: Secondary | ICD-10-CM

## 2016-08-19 LAB — POCT GLYCOSYLATED HEMOGLOBIN (HGB A1C): HEMOGLOBIN A1C: 9.5

## 2016-08-19 MED ORDER — DULAGLUTIDE 0.75 MG/0.5ML ~~LOC~~ SOAJ: SUBCUTANEOUS | 1 refills | Status: DC

## 2016-08-19 MED ORDER — GLUCOSE BLOOD VI STRP: ORAL_STRIP | 12 refills | Status: DC

## 2016-08-19 NOTE — Patient Instructions (Addendum)
Please continue: - Lantus 24 units at bedtime - Metformin 1000 mg 2x a day  Start Trulicity [id number] mg weekly. In 3 weeks, let me know if I need to send a prescription for the 1.5 mg.  Stop Januvia in 4 days after taking the first Trulicity dose.  Please return in 1.5 months with your sugar log.

## 2016-08-19 NOTE — Progress Notes (Signed)
Subjective:     Patient ID: Lisa Duran, female   DOB: 1981-03-03, 36 y.o.   MRN: [id number]  HPI Ms Rademaker is a pleasant 36 y.o. woman returning for f/u for DM2, insulin dependent, uncontrolled, dx 37/6283, w/o complications. Last visit 8 mo ago.  She is seeing a nutritionist. She is trying to use 45 g carbs per meal.  Last HbA1c: Lab Results  Component Value Date   HGBA1C 8.6 (H) 12/20/2015   HGBA1C 8.2 11/22/2015   HGBA1C 7.7 (H) 06/29/2015   She is on: - Lantus 24 units at bedtime - started 03/2015 >> ran out for 2 weeks, but restarted ~2 mo ago - Metformin 1000 mg 2x a day - Januvia 100 mg in am - started 07/2015 She was on Invokana 100 mg in am (started 12/2014) >> 2 yeast inf >> tx with Diflucan; she was exhausted and had nocturia >> stopped 03/12/2015 Tried Victoza 2 mo ago >> AP, severe GERD, inj site rxn Tried Januvia >> tolerated it well, but stopped when Invokana was suggested.  She was also on Actos, but taken off b/c good control.  She is on a Verio meter.  She checks sugars 2x a day: - am: 159-198, 224 >> 110-140, 158 when forgot Lantus >> 87-125 >> 90-110 >> 150s after she ran out of insulin - before lunch: 119-172 >> 98, 129 >> 145 >> 90-190 >> 220s - 2h after lunch: 144, 158-261 >> 86, 104-168 >> n/c - before dinner: 118-202 >> 119-147, 156 >> 97-160 >> 80s-110 >> n/c - 2h post dinner: 180-190 >> 200s >> 176-201 >> 141 >> n/c - b/t: 159-190 >> n/c Hypoglycemia awareness at lower 80's.  Lowest sugars: 80's >> 110. No lows at night. Highest sugar: upper 200s - steroids >> 290  No CKD. Lab Results  Component Value Date   BUN 9 12/20/2015   Lab Results  Component Value Date   CREATININE 0.77 12/20/2015   - latest lipid panel: Lab Results  Component Value Date   CHOL 182 12/20/2015   HDL 54.60 12/20/2015   LDLCALC 111 (H) 12/20/2015   TRIG 81.0 12/20/2015   CHOLHDL 3 12/20/2015   Last eye exam 06/2015 - [facility] [place] [facility]. No DR.  Has  numbness and tingling in hands and feet.  She had CP >> had a stress test 07/04/2015 >> normal.   I reviewed pt's medications, allergies, PMH, social hx, family hx, and changes were documented in the history of present illness. Otherwise, unchanged from my initial visit note:  Past Medical History:  Diagnosis Date  . ADHD (attention deficit hyperactivity disorder)   . ALLERGIC RHINITIS   . Allergy   . Anemia   . ANXIETY   . Depression   . DIABETES MELLITUS, TYPE II   . GERD (gastroesophageal reflux disease)   . MIGRAINE, COMMON   . Obesity, unspecified   . OVARIAN CYST    Past Surgical History:  Procedure Laterality Date  . NO PAST SURGERIES     History   Social History  . Marital Status: Married    Spouse Name: N/A    Number of Children: 0  . Years of Education: N/A   Occupational History  . Estate agent   Social History Main Topics  . Smoking status: Was smoking 1/2 PPD, quit 2009  . Smokeless tobacco: No     Comment: single, separated from spouse 02/2010.   [name] Alcohol Use: Yes, mixed, 2-4 x mo,  1-2 drinks at a time  . Drug Use: No   Social History Narrative   Single, separated from spouse 02/2010   Current Outpatient Prescriptions on File Prior to Visit  Medication Sig Dispense Refill  . almotriptan (AXERT) 12.5 MG tablet Take 1 tablet (12.5 mg total) by mouth as needed for migraine. may repeat in 2 hours if needed 12 tablet 3  . ALPRAZolam (XANAX) 0.5 MG tablet TAKE 1 TABLET BY MOUTH THREE TIMES DAILY AS NEEDED FOR ANXIETY 30 tablet 5  . atomoxetine (STRATTERA) 60 MG capsule Take 1 capsule (60 mg total) by mouth daily. 30 capsule 0  . citalopram (CELEXA) 20 MG tablet Take 1 tablet by mouth  daily 90 tablet 2  . fluticasone (FLONASE) 50 MCG/ACT nasal spray Place 2 sprays into both nostrils daily.   1  . glucose blood (ONETOUCH VERIO) test strip Use as instructed 100 each 12  . Insulin Glargine (LANTUS SOLOSTAR) 100 UNIT/ML Solostar Pen Inject 24 Units  into the skin at bedtime. 30 mL 1  . Insulin Pen Needle 32G X 4 MM MISC Use 1x a day 100 each 3  . lamoTRIgine (LAMICTAL) 100 MG tablet TAKE 1 TABLET BY MOUTH TWO  TIMES DAILY 180 tablet 1  . levonorgestrel (MIRENA) 20 MCG/24HR IUD 1 Intra Uterine Device (1 each total) by Intrauterine route once. 1 each 0  . metFORMIN (GLUCOPHAGE) 1000 MG tablet TAKE 1/2 TABLET BY MOUTH  TWICE A DAY WITH A MEAL 90 tablet 1  . omeprazole (PRILOSEC) 40 MG capsule Take 1 capsule (40 mg total) by mouth daily before supper. 90 capsule 3  . Prenatal Vit-Fe Fumarate-FA (MULTIVITAMIN-PRENATAL) 27-0.8 MG TABS tablet Take 1 tablet by mouth daily at 12 noon.    . promethazine (PHENERGAN) 25 MG tablet Take 25 mg by mouth every 6 (six) hours as needed for nausea.    . sitaGLIPtin (JANUVIA) 100 MG tablet Take 1 tablet (100 mg total) by mouth daily. 90 tablet 3  . traMADol-acetaminophen (ULTRACET) 37.5-325 MG per tablet Take 1 tablet by mouth every 6 (six) hours as needed for pain.    [name] triamcinolone cream (KENALOG) 0.1 % Apply topically 2 (two) times daily as needed. 30 g 1   No current facility-administered medications on file prior to visit.    Allergies  Allergen Reactions  . Propofol Shortness Of Breath    SOB, chest tightness, wheezing after colonoscopy on 09-18-15  . Chicken Allergy   . Gluten Meal Diarrhea  . Other     Any Nuts   . Victoza [Liraglutide] Rash    Family History  Problem Relation Age of Onset  . Diabetes Mother   . Hyperlipidemia Mother   . Diabetes Father   . Hyperlipidemia Father   . Coronary artery disease Father   . Hypertension Father   . Cancer Father     esoph  . Stroke Father   . Cancer Maternal Grandfather 45    lung, met   Review of Systems Constitutional: no weight gain, no fatigue, no subjective hyperthermia,  no excessive urination Eyes: no blurry vision, no xerophthalmia ENT: no sore throat, no nodules palpated in throat, no dysphagia/odynophagia, no  hoarseness Cardiovascular: no CP/no SOB/palpitations/no leg swelling Respiratory: no cough/SOB Gastrointestinal: no N/V/D/C/heartburn. Musculoskeletal: no muscle/ joint aches Skin: no rash, no itching Neurological: no tremors/dizziness, no HAs    Objective:   Physical Exam BP 112/74 (BP Location: Left Arm, Patient Position: Sitting)   Pulse 100   Ht '5\' 4"'  (  1.626 m)   Wt 212 lb (96.2 kg)   LMP  (LMP Unknown)   SpO2 97%   BMI 36.39 kg/m  Body mass index is 36.39 kg/m. Wt Readings from Last 3 Encounters:  08/19/16 212 lb (96.2 kg)  08/01/16 211 lb 8 oz (95.9 kg)  06/12/16 215 lb (97.5 kg)   Constitutional: overweight, in NAD Eyes: PERRLA, EOMI, no exophthalmos ENT: moist mucous membranes, no thyromegaly, no cervical lymphadenopathy Cardiovascular: tachycardia, RR, No MRG Respiratory: CTA B Gastrointestinal: abdomen soft, NT, ND, BS+ Musculoskeletal: no deformities, strength intact in all 4 Skin: moist, warm, rosacea-like rash on upper chest Neurological: no tremor with outstretched hands, DTR normal in all 4  Assessment:     1. DM2, insulin-dependent, uncontrolled, without complications - r/o autoimmunity Component     Latest Ref Rng 08/17/2012  Glutamic Acid Decarb Ab     <=1.0 U/mL <1.0  Pancreatic Islet Cell Antibody     < 5 JDF Units <5  C-Peptide     0.80 - 3.90 ng/mL 1.84  Glucose     70 - 99 mg/dL 104 (H)     2. Tachycardia  Plan:     1. Patient with long-standing, uncontrolled diabetes, on Metformin + Lantus + Januvia, returning after another long absence. Her sugars initially improved after adding Januvia, but they increased especially when she was off insulin >> they remain high now despite restarting insulin ~2 mo ago. Her sugars are highest after meals >> will start Trulicity. - I suggested to:  Patient Instructions  Please continue: - Lantus 24 units at bedtime - Metformin 1000 mg 2x a day  Start Trulicity 9.31 mg weekly. In 3 weeks, let me know if  I need to send a prescription for the 1.5 mg.  Stop Januvia in 4 days after taking the first Trulicity dose.  Please return in 1.5 months with your sugar log.   - continue checking sugars at different times of the day - check 2 times a day, rotating checks - need a new eye exam - checked Hba1c today >> 9.5% (higher!) - Return to clinic in 3 mo with sugar log   2. Tachycardia - persistent  - taken off Adderall 2.5 weeks ago >> pulse still high - reviewed latest TSH - normal: Lab Results  Component Value Date   TSH 2.40 06/29/2015  - stress test negative  Philemon Kingdom, MD PhD [facility] Endocrinology

## 2016-08-19 NOTE — Addendum Note (Signed)
Addended by: Caprice Beaver T on: 08/19/2016 04:08 PM   Modules accepted: Orders

## 2016-08-31 ENCOUNTER — Other Ambulatory Visit: Payer: Self-pay | Admitting: Internal Medicine

## 2016-09-12 ENCOUNTER — Encounter: Payer: Self-pay | Admitting: Internal Medicine

## 2016-10-06 ENCOUNTER — Ambulatory Visit: Payer: Self-pay | Admitting: Internal Medicine

## 2016-10-14 ENCOUNTER — Ambulatory Visit: Payer: Self-pay | Admitting: Internal Medicine

## 2016-10-16 ENCOUNTER — Encounter: Payer: Self-pay | Admitting: Internal Medicine

## 2016-10-16 ENCOUNTER — Ambulatory Visit (INDEPENDENT_AMBULATORY_CARE_PROVIDER_SITE_OTHER): Payer: BLUE CROSS/BLUE SHIELD | Admitting: Internal Medicine

## 2016-10-16 VITALS — BP 132/88 | HR 100 | Wt 215.0 lb

## 2016-10-16 DIAGNOSIS — E1149 Type 2 diabetes mellitus with other diabetic neurological complication: Secondary | ICD-10-CM

## 2016-10-16 MED ORDER — INSULIN GLARGINE 100 UNIT/ML SOLOSTAR PEN: 32.0000 [IU] | PEN_INJECTOR | Freq: Every day | SUBCUTANEOUS | 1 refills | Status: DC

## 2016-10-16 MED ORDER — CYCLOSET 0.8 MG PO TABS: ORAL_TABLET | ORAL | 2 refills | Status: DC

## 2016-10-16 NOTE — Progress Notes (Signed)
Subjective:     Patient ID: Lisa Duran, female   DOB: 04/03/1981, 36 y.o.   MRN: [id number]  HPI Lisa Duran is a pleasant 36 y.o. woman returning for f/u for DM2, insulin dependent, uncontrolled, dx 44/0347, w/o complications. Last visit 2 mo ago.  Since last visit, her Adderall was stopped due to concerns for tachycardia. She was tried on Strattera, however, she feels that this is not working and would like to get back on the Adderall. Her anxiety is worse.  Last HbA1c: Lab Results  Component Value Date   HGBA1C 9.5 08/19/2016   HGBA1C 8.6 (H) 12/20/2015   HGBA1C 8.2 11/22/2015   She is on: - Lantus 24 units at bedtime - started 03/2015 >> now 32 units - Metformin 1000 mg 2x a day - Januvia 100 mg in am  We tried Trulicity >> could not tolerate it: N/V/AP. She restarted Januvia 09/2016. She was on Invokana 100 mg in am (started 12/2014) >> 2 yeast inf >> tx with Diflucan; she was exhausted and had nocturia >> stopped 03/12/2015 Tried Victoza 2 mo ago >> AP, severe GERD, inj site rxn Tried Januvia >> tolerated it well, but stopped when Invokana was suggested.  She was also on Actos, but taken off b/c good control.  She has a Chief Technology Officer.  She checks sugars 0-1x a day: - am: 110-140, 158 when forgot Lantus >> 87-125 >> 90-110 >> 150s after she ran out of insulin >> 99-123 - before lunch: 119-172 >> 98, 129 >> 145 >> 90-190 >> 220s >> 182 - 2h after lunch: 144, 158-261 >> 86, 104-168 >> n/c >> 151 - before dinner: 118-202 >> 119-147, 156 >> 97-160 >> 80s-110 >> n/c >> 119-143 - 2h post dinner: 180-190 >> 200s >> 176-201 >> 141 >> n/c - b/t: 159-190 >> n/c Hypoglycemia awareness at lower 80's.  Lowest sugars: 80's >> 110 >> 99.  Highest sugar: upper 200s - steroids >> 290 >> 240 after dinner.   No CKD. Lab Results  Component Value Date   BUN 9 12/20/2015   Lab Results  Component Value Date   CREATININE 0.77 12/20/2015   - latest lipid panel: Lab Results  Component Value  Date   CHOL 182 12/20/2015   HDL 54.60 12/20/2015   LDLCALC 111 (H) 12/20/2015   TRIG 81.0 12/20/2015   CHOLHDL 3 12/20/2015   Last eye exam 06/2015 - [place] [facility] in [facility]. No DR.  Has numbness and tingling in hands and feet.  She had CP >> had a stress test 07/04/2015 >> normal.   I reviewed pt's medications, allergies, PMH, social hx, family hx, and changes were documented in the history of present illness. Otherwise, unchanged from my initial visit note:  Past Medical History:  Diagnosis Date  . ADHD (attention deficit hyperactivity disorder)   . ALLERGIC RHINITIS   . Allergy   . Anemia   . ANXIETY   . Depression   . DIABETES MELLITUS, TYPE II   . GERD (gastroesophageal reflux disease)   . MIGRAINE, COMMON   . Obesity, unspecified   . OVARIAN CYST    Past Surgical History:  Procedure Laterality Date  . NO PAST SURGERIES     History   Social History  . Marital Status: Married    Spouse Name: N/A    Number of Children: 0   Occupational History  . Estate agent   Social History Main Topics  . Smoking status: Was smoking 1/2  PPD, quit 2009  . Smokeless tobacco: No     Comment: single, separated from spouse 02/2010.   [name] Alcohol Use: Yes, mixed, 2-4 x mo, 1-2 drinks at a time  . Drug Use: No   Social History Narrative   Single, separated from spouse 02/2010   Current Outpatient Prescriptions on File Prior to Visit  Medication Sig Dispense Refill  . almotriptan (AXERT) 12.5 MG tablet Take 1 tablet (12.5 mg total) by mouth as needed for migraine. may repeat in 2 hours if needed 12 tablet 3  . ALPRAZolam (XANAX) 0.5 MG tablet TAKE 1 TABLET BY MOUTH THREE TIMES DAILY AS NEEDED FOR ANXIETY 30 tablet 5  . atomoxetine (STRATTERA) 60 MG capsule Take 1 capsule (60 mg total) by mouth daily. 30 capsule 0  . citalopram (CELEXA) 20 MG tablet TAKE 1 TABLET BY MOUTH  DAILY 90 tablet 1  . Dulaglutide (TRULICITY) 4.58 KD/9.8PJ SOPN Inject 0.75 mg under skin  weekly. 4 pen 1  . fluticasone (FLONASE) 50 MCG/ACT nasal spray Place 2 sprays into both nostrils daily.   1  . glucose blood (ONETOUCH VERIO) test strip Use 2x a day 100 each 12  . Insulin Glargine (LANTUS SOLOSTAR) 100 UNIT/ML Solostar Pen Inject 24 Units into the skin at bedtime. 30 mL 1  . Insulin Pen Needle 32G X 4 MM MISC Use 1x a day 100 each 3  . lamoTRIgine (LAMICTAL) 100 MG tablet TAKE 1 TABLET BY MOUTH TWO  TIMES DAILY 180 tablet 1  . levonorgestrel (MIRENA) 20 MCG/24HR IUD 1 Intra Uterine Device (1 each total) by Intrauterine route once. 1 each 0  . metFORMIN (GLUCOPHAGE) 1000 MG tablet TAKE 1/2 TABLET BY MOUTH  TWICE A DAY WITH A MEAL 90 tablet 1  . omeprazole (PRILOSEC) 40 MG capsule Take 1 capsule (40 mg total) by mouth daily before supper. 90 capsule 3  . Prenatal Vit-Fe Fumarate-FA (MULTIVITAMIN-PRENATAL) 27-0.8 MG TABS tablet Take 1 tablet by mouth daily at 12 noon.    . promethazine (PHENERGAN) 25 MG tablet Take 25 mg by mouth every 6 (six) hours as needed for nausea.    . sitaGLIPtin (JANUVIA) 100 MG tablet Take 1 tablet (100 mg total) by mouth daily. 90 tablet 3  . traMADol-acetaminophen (ULTRACET) 37.5-325 MG per tablet Take 1 tablet by mouth every 6 (six) hours as needed for pain.    [name] triamcinolone cream (KENALOG) 0.1 % Apply topically 2 (two) times daily as needed. 30 g 1   No current facility-administered medications on file prior to visit.    Allergies  Allergen Reactions  . Propofol Shortness Of Breath    SOB, chest tightness, wheezing after colonoscopy on 09-18-15  . Chicken Allergy   . Gluten Meal Diarrhea  . Other     Any Nuts   . Victoza [Liraglutide] Rash    Family History  Problem Relation Age of Onset  . Diabetes Mother   . Hyperlipidemia Mother   . Diabetes Father   . Hyperlipidemia Father   . Coronary artery disease Father   . Hypertension Father   . Cancer Father     esoph  . Stroke Father   . Cancer Maternal Grandfather 5    lung, met    Review of Systems Constitutional:+ weight gain, no fatigue,+  subjective hyperthermia,  no excessive urination, + Poor sleep  Eyes: no blurry vision, no xerophthalmia ENT: no sore throat, no nodules palpated in throat, no dysphagia/odynophagia, no hoarseness Cardiovascular: no CP/no SOB/palpitations/no leg  swelling Respiratory: no cough/SOB Gastrointestinal: no N/V/D/+C/ no heartburn. Musculoskeletal: no muscle/ joint aches Skin: no rash, no itching Neurological: no tremors/dizziness, no HAs    Objective:   Physical Exam BP 132/88 (BP Location: Left Arm, Patient Position: Sitting)   Pulse 100   Wt 215 lb (97.5 kg)   SpO2 95%   BMI 36.90 kg/m    Body mass index is 36.9 kg/m. Wt Readings from Last 3 Encounters:  10/16/16 215 lb (97.5 kg)  08/19/16 212 lb (96.2 kg)  08/01/16 211 lb 8 oz (95.9 kg)   Constitutional: overweight, in NAD Eyes: PERRLA, EOMI, no exophthalmos ENT: moist mucous membranes, no thyromegaly, no cervical lymphadenopathy Cardiovascular: tachycardia, RR, No MRG Respiratory: CTA B Gastrointestinal: abdomen soft, NT, ND, BS+ Musculoskeletal: no deformities, strength intact in all 4 Skin: moist, warm Neurological: no tremor with outstretched hands, DTR normal in all 4  Assessment:     1. DM2, insulin-dependent, uncontrolled, without complications - r/o autoimmunity Component     Latest Ref Rng 08/17/2012  Glutamic Acid Decarb Ab     <=1.0 U/mL <1.0  Pancreatic Islet Cell Antibody     < 5 JDF Units <5  C-Peptide     0.80 - 3.90 ng/mL 1.84  Glucose     70 - 99 mg/dL 104 (H)     2. Tachycardia  Plan:     1. Patient with long-standing, uncontrolled diabetes, on Metformin + Lantus + Januvia. Since last visit, we tried to elicit the, but she could not tolerate it due to GI symptoms. She switched back to Januvia approximately 1 month ago. Her sugars are better as she also increased her Lantus. She is not checking frequently enough, though. I advised her  to start checking more frequently and also check some sugars towards the end of the day. - However, I would also like to add Cycloset >> she agrees. - Reviewed last HbA1c, which was higher, at 9.5%. - I suggested to:  Patient Instructions  Please continue: - Lantus 32 units at bedtime - Metformin 1000 mg 2x a day - Januvia 100 mg in am  Start Cycloset 0.8 mg daily with b'fast and increase by 1 tab every week until you get to 2.4 mg.   Please return in 1.5 months with your sugar log.  - continue checking sugars at different times of the day - check 2 times a day, rotating checks - needs a new eye exam >> will schedule - Return to clinic in 1.5 mo with sugar log   2. Tachycardia - persistent  - taken off Adderall >> pulse still high - reviewed latest TSH - normal: Lab Results  Component Value Date   TSH 2.40 06/29/2015  - stress test negative - unclear etiology  Philemon Kingdom, MD PhD [facility] Endocrinology

## 2016-10-16 NOTE — Patient Instructions (Signed)
Please continue: - Lantus 32 units at bedtime - Metformin 1000 mg 2x a day - Januvia 100 mg in am  Start Cycloset 0.8 mg daily with b'fast and increase by 1 tab every week until you get to 2.4 mg.   Please return in 1.5 months with your sugar log.

## 2016-10-19 ENCOUNTER — Encounter: Payer: Self-pay | Admitting: Internal Medicine

## 2016-10-19 MED ORDER — METOPROLOL SUCCINATE ER 25 MG PO TB24: 12.5000 mg | ORAL_TABLET | Freq: Every day | ORAL | 1 refills | Status: DC

## 2016-10-20 MED ORDER — AMPHETAMINE-DEXTROAMPHET ER 20 MG PO CP24: 20.0000 mg | ORAL_CAPSULE | Freq: Every day | ORAL | 0 refills | Status: DC

## 2016-10-20 NOTE — Telephone Encounter (Signed)
Please advise on Adderall request.

## 2016-10-20 NOTE — Addendum Note (Signed)
Addended by: Binnie Rail on: 10/20/2016 02:36 PM   Modules accepted: Orders

## 2016-10-24 DIAGNOSIS — H04123 Dry eye syndrome of bilateral lacrimal glands: Secondary | ICD-10-CM | POA: Diagnosis not present

## 2016-10-24 DIAGNOSIS — E119 Type 2 diabetes mellitus without complications: Secondary | ICD-10-CM | POA: Diagnosis not present

## 2016-10-24 LAB — HM DIABETES EYE EXAM

## 2016-12-17 ENCOUNTER — Encounter: Payer: Self-pay | Admitting: Internal Medicine

## 2016-12-19 ENCOUNTER — Other Ambulatory Visit: Payer: Self-pay | Admitting: Internal Medicine

## 2016-12-19 ENCOUNTER — Ambulatory Visit: Payer: BLUE CROSS/BLUE SHIELD | Admitting: Internal Medicine

## 2016-12-22 ENCOUNTER — Encounter: Payer: Self-pay | Admitting: Internal Medicine

## 2016-12-22 MED ORDER — AMPHETAMINE-DEXTROAMPHET ER 20 MG PO CP24: 20.0000 mg | ORAL_CAPSULE | Freq: Every day | ORAL | 0 refills | Status: DC

## 2016-12-22 MED ORDER — METOPROLOL SUCCINATE ER 25 MG PO TB24: 12.5000 mg | ORAL_TABLET | Freq: Every day | ORAL | 1 refills | Status: DC

## 2016-12-23 ENCOUNTER — Other Ambulatory Visit: Payer: Self-pay | Admitting: Internal Medicine

## 2016-12-25 ENCOUNTER — Ambulatory Visit (INDEPENDENT_AMBULATORY_CARE_PROVIDER_SITE_OTHER): Payer: BLUE CROSS/BLUE SHIELD | Admitting: Internal Medicine

## 2016-12-25 ENCOUNTER — Encounter: Payer: Self-pay | Admitting: Internal Medicine

## 2016-12-25 VITALS — BP 120/74 | HR 103 | Ht 64.5 in | Wt 213.0 lb

## 2016-12-25 DIAGNOSIS — E669 Obesity, unspecified: Secondary | ICD-10-CM

## 2016-12-25 DIAGNOSIS — E1149 Type 2 diabetes mellitus with other diabetic neurological complication: Secondary | ICD-10-CM

## 2016-12-25 DIAGNOSIS — Z6836 Body mass index (BMI) 36.0-36.9, adult: Secondary | ICD-10-CM | POA: Diagnosis not present

## 2016-12-25 DIAGNOSIS — IMO0001 Reserved for inherently not codable concepts without codable children: Secondary | ICD-10-CM

## 2016-12-25 LAB — POCT GLYCOSYLATED HEMOGLOBIN (HGB A1C): HEMOGLOBIN A1C: 7.6

## 2016-12-25 MED ORDER — GLIPIZIDE ER 5 MG PO TB24: 5.0000 mg | ORAL_TABLET | Freq: Every day | ORAL | 3 refills | Status: DC

## 2016-12-25 NOTE — Patient Instructions (Addendum)
Please continue: - Lantus 32 units at bedtime - Metformin 1000 mg 2x a day - Januvia 100 mg in am  Please start: - Glipizide ER 5 mg daily in am, before b'fast  Please return in 3 months with your sugar log.   Plant-based diet materials: - Lectures (you tube):  Alyssa Grove: "Breaking the Food Seduction"  Doug Lisle: "How to Lose Weight, without Losing Your Mind" - Documentaries:  [place] over [employer], Sick and Nearly Dead  The [employer]  Overweight and undernourished - Books:  Alyssa Grove: "Program for Reversing Diabetes"  Caldwell Esselstyn: "Prevent and Reverse Heart Disease"  Alyssa Grove: "[employer]: "How Not to Die"  Heath Gold: "The [place] Study"  Norma Fredrickson: "Supermarket Vegan" (cookbook) - Facebook pages:   Moshe Salisbury versus Knives  Nutrition facts  Vegucated  [place]

## 2016-12-25 NOTE — Progress Notes (Signed)
Subjective:     Patient ID: Lisa Duran, female   DOB: 27-Aug-1980, 36 y.o.   MRN: [id number]  HPI Lisa Duran is a pleasant 36 y.o. woman returning for f/u for DM2, insulin dependent, uncontrolled, dx 61/6073, w/o complications. Last visit 2 mo ago.  At last visit, his sugars were higher and HbA1c rate and 9.5%, we started Cycloset. Her sugars improved, however, she could not tolerate it so she ended up stopping it 2 weeks ago.  Last HbA1c: Lab Results  Component Value Date   HGBA1C 9.5 08/19/2016   HGBA1C 8.6 (H) 12/20/2015   HGBA1C 8.2 11/22/2015   She is on: - Lantus 24 units at bedtime - started 03/2015 >> 32 units - Metformin 1000 mg 2x a day - Januvia 100 mg in am  We tried Cycloset 0.8 mg daily >> stopped end of 11/2016 b/c GERD/AP/C We tried Trulicity >> could not tolerate it: N/V/AP. She restarted Januvia 09/2016. She was on Invokana 100 mg in am (started 12/2014) >> 2 yeast inf >> tx with Diflucan; she was exhausted and had nocturia >> stopped 03/12/2015 Tried Victoza 2 mo ago >> AP, severe GERD, inj site rxn Tried Januvia >> tolerated it well, but stopped when Invokana was suggested.  She was also on Actos, but taken off b/c good control.  She has a Chief Technology Officer.  She checks sugars 0-1x a day: - am:  150s after she ran out of insulin >> 99-123 >> 138, 199 - before lunch:  90-190 >> 220s >> 182 >> 117, 183 (no metf.) - 2h after lunch:  86, 104-168 >> n/c >> 151 >> 149-164 - before dinner:  80s-110 >> n/c >> 119-143 >> 125, 155 - 2h post dinner: 180-190 >> 200s >> 176-201 >> 141 >> n/c - b/t: 159-190 >> n/c Hypoglycemia awareness at lower 80's.   No CKD. Lab Results  Component Value Date   BUN 9 12/20/2015   Lab Results  Component Value Date   CREATININE 0.77 12/20/2015   - latest lipid panel: Lab Results  Component Value Date   CHOL 182 12/20/2015   HDL 54.60 12/20/2015   LDLCALC 111 (H) 12/20/2015   TRIG 81.0 12/20/2015   CHOLHDL 3 12/20/2015   Last eye  exam 10/2016 - [place] [facility]. No DR. She has numbness and tingling in hands and feet.  She had CP >> had a stress test 07/04/2015 >> normal.   Started Metoprolol  Since last visit, to control her heart rate.  I reviewed pt's medications, allergies, PMH, social hx, family hx, and changes were documented in the history of present illness. Otherwise, unchanged from my initial visit note:  Past Medical History:  Diagnosis Date  . ADHD (attention deficit hyperactivity disorder)   . ALLERGIC RHINITIS   . Allergy   . Anemia   . ANXIETY   . Depression   . DIABETES MELLITUS, TYPE II   . GERD (gastroesophageal reflux disease)   . MIGRAINE, COMMON   . Obesity, unspecified   . OVARIAN CYST    Past Surgical History:  Procedure Laterality Date  . NO PAST SURGERIES     History   Social History  . Marital Status: Married    Spouse Name: N/A    Number of Children: 0   Occupational History  . Estate agent   Social History Main Topics  . Smoking status: Was smoking 1/2 PPD, quit 2009  . Smokeless tobacco: No  Comment: single, separated from spouse 02/2010.   [name] Alcohol Use: Yes, mixed, 2-4 x mo, 1-2 drinks at a time  . Drug Use: No   Social History Narrative   Single, separated from spouse 02/2010   Current Outpatient Prescriptions on File Prior to Visit  Medication Sig Dispense Refill  . almotriptan (AXERT) 12.5 MG tablet Take 1 tablet (12.5 mg total) by mouth as needed for migraine. may repeat in 2 hours if needed 12 tablet 3  . ALPRAZolam (XANAX) 0.5 MG tablet TAKE 1 TABLET BY MOUTH THREE TIMES DAILY AS NEEDED FOR ANXIETY 30 tablet 5  . amphetamine-dextroamphetamine (ADDERALL XR) 20 MG 24 hr capsule Take 1 capsule (20 mg total) by mouth daily. 30 capsule 0  . BD PEN NEEDLE NANO U/F 32G X 4 MM MISC USE ONCE A DAY 90 each 3  . citalopram (CELEXA) 20 MG tablet TAKE 1 TABLET BY MOUTH  DAILY 90 tablet 1  . fluticasone (FLONASE) 50 MCG/ACT nasal spray  Place 2 sprays into both nostrils daily.   1  . glucose blood (ONETOUCH VERIO) test strip Use 2x a day 100 each 12  . Insulin Glargine (LANTUS SOLOSTAR) 100 UNIT/ML Solostar Pen Inject 32 Units into the skin at bedtime. 30 mL 1  . Insulin Pen Needle 32G X 4 MM MISC Use 1x a day 100 each 3  . JANUVIA 100 MG tablet TAKE 1 TABLET BY MOUTH  DAILY 90 tablet 1  . lamoTRIgine (LAMICTAL) 100 MG tablet TAKE 1 TABLET BY MOUTH TWO  TIMES DAILY 180 tablet 1  . levonorgestrel (MIRENA) 20 MCG/24HR IUD 1 Intra Uterine Device (1 each total) by Intrauterine route once. 1 each 0  . metFORMIN (GLUCOPHAGE) 1000 MG tablet TAKE 1/2 TABLET BY MOUTH  TWICE A DAY WITH A MEAL 90 tablet 1  . metoprolol succinate (TOPROL-XL) 25 MG 24 hr tablet Take 0.5 tablets (12.5 mg total) by mouth daily. 45 tablet 1  . omeprazole (PRILOSEC) 40 MG capsule TAKE 1 CAPSULE BY MOUTH  DAILY BEFORE SUPPER. 90 capsule 3  . Prenatal Vit-Fe Fumarate-FA (MULTIVITAMIN-PRENATAL) 27-0.8 MG TABS tablet Take 1 tablet by mouth daily at 12 noon.    . promethazine (PHENERGAN) 25 MG tablet Take 25 mg by mouth every 6 (six) hours as needed for nausea.    . traMADol-acetaminophen (ULTRACET) 37.5-325 MG per tablet Take 1 tablet by mouth every 6 (six) hours as needed for pain.    [name] triamcinolone cream (KENALOG) 0.1 % Apply topically 2 (two) times daily as needed. 30 g 1  . CYCLOSET 0.8 MG TABS Take 3 tablets in am with b'fast (Patient not taking: Reported on 12/25/2016) 90 tablet 2   No current facility-administered medications on file prior to visit.    Allergies  Allergen Reactions  . Propofol Shortness Of Breath    SOB, chest tightness, wheezing after colonoscopy on 09-18-15  . Chicken Allergy   . Gluten Meal Diarrhea  . Other     Any Nuts   . Victoza [Liraglutide] Rash    Family History  Problem Relation Age of Onset  . Diabetes Mother   . Hyperlipidemia Mother   . Diabetes Father   . Hyperlipidemia Father   . Coronary artery disease Father    . Hypertension Father   . Cancer Father        esoph  . Stroke Father   . Cancer Maternal Grandfather 43       lung, met   Review of Systems Constitutional:  no weight gain/no weight loss, no fatigue, no subjective hyperthermia, no subjective hypothermia Eyes: no blurry vision, no xerophthalmia ENT: no sore throat, no nodules palpated in throat, no dysphagia, no odynophagia, no hoarseness Cardiovascular: no CP/no SOB/no palpitations/no leg swelling Respiratory: no cough/no SOB/no wheezing Gastrointestinal: no N/no V/no D/no C/no acid reflux Musculoskeletal: no muscle aches/no joint aches Skin: no rashes, no hair loss Neurological: no tremors/no numbness/no tingling/no dizziness  I reviewed pt's medications, allergies, PMH, social hx, family hx, and changes were documented in the history of present illness. Otherwise, unchanged from my initial visit note.     Objective:   Physical Exam BP 120/74 (BP Location: Left Arm, Patient Position: Sitting)   Pulse (!) 103   Ht 5' 4.5" (1.638 m)   Wt 213 lb (96.6 kg)   SpO2 98%   BMI 36.00 kg/m    Body mass index is 36 kg/m. Wt Readings from Last 3 Encounters:  12/25/16 213 lb (96.6 kg)  10/16/16 215 lb (97.5 kg)  08/19/16 212 lb (96.2 kg)   Constitutional: overweight, in NAD Eyes: PERRLA, EOMI, no exophthalmos ENT: moist mucous membranes, no thyromegaly, no cervical lymphadenopathy Cardiovascular:  Tachycardia, RR, No MRG Respiratory: CTA B Gastrointestinal: abdomen soft, NT, ND, BS+ Musculoskeletal: no deformities, strength intact in all 4 Skin: moist, warm, no rashes Neurological: no tremor with outstretched hands, DTR normal in all 4   Assessment:     1. DM2, insulin-dependent, uncontrolled, without complications - r/o autoimmunity Component     Latest Ref Rng 08/17/2012  Glutamic Acid Decarb Ab     <=1.0 U/mL <1.0  Pancreatic Islet Cell Antibody     < 5 JDF Units <5  C-Peptide     0.80 - 3.90 ng/mL 1.84  Glucose      70 - 99 mg/dL 104 (H)     2. Obesity   Plan:     1. Patient with long-standing, uncontrolled diabetes, on Metformin + Lantus + Januvia. Which are to add Cycloset at last visit, and this worked well, however, she had GI symptoms. She stopped this 2 weeks ago. Her sugars are higher, therefore, with discussed about starting glipizide ER. She has multiple intolerances to other medications. - I suggested to:  Patient Instructions  Please continue: - Lantus 32 units at bedtime - Metformin 1000 mg 2x a day - Januvia 100 mg in am  Please start: - Glipizide ER 5 mg daily in am, before b'fast  Please return in 3 months with your sugar log.   - today, HbA1c is 7.6%  (better!) - continue checking sugars at different times of the day - check 1x a day, rotating checks - advised for yearly eye exams >> she is UTD - Return to clinic in 3 mo with sugar log     2. Obesity  - we discussed about different diets  - I discouraged her in using a ketogenic diet - Explained disadvantages - I strongly suggested that she started the plant-based diet. We discussed the advantages and challenges. Given materials. She agrees to try this.  Philemon Kingdom, MD PhD [facility] Endocrinology

## 2017-01-07 DIAGNOSIS — Z713 Dietary counseling and surveillance: Secondary | ICD-10-CM | POA: Diagnosis not present

## 2017-02-03 ENCOUNTER — Ambulatory Visit (INDEPENDENT_AMBULATORY_CARE_PROVIDER_SITE_OTHER): Payer: BLUE CROSS/BLUE SHIELD | Admitting: Internal Medicine

## 2017-02-03 ENCOUNTER — Encounter: Payer: Self-pay | Admitting: Internal Medicine

## 2017-02-03 ENCOUNTER — Other Ambulatory Visit (INDEPENDENT_AMBULATORY_CARE_PROVIDER_SITE_OTHER): Payer: BLUE CROSS/BLUE SHIELD

## 2017-02-03 VITALS — BP 102/76 | HR 96 | Temp 98.8°F | Resp 16 | Ht 64.5 in | Wt 214.0 lb

## 2017-02-03 DIAGNOSIS — M791 Myalgia, unspecified site: Secondary | ICD-10-CM

## 2017-02-03 DIAGNOSIS — F419 Anxiety disorder, unspecified: Secondary | ICD-10-CM

## 2017-02-03 DIAGNOSIS — F32A Depression, unspecified: Secondary | ICD-10-CM

## 2017-02-03 DIAGNOSIS — Z0001 Encounter for general adult medical examination with abnormal findings: Secondary | ICD-10-CM | POA: Diagnosis not present

## 2017-02-03 DIAGNOSIS — R5382 Chronic fatigue, unspecified: Secondary | ICD-10-CM

## 2017-02-03 DIAGNOSIS — G43009 Migraine without aura, not intractable, without status migrainosus: Secondary | ICD-10-CM

## 2017-02-03 DIAGNOSIS — Z0000 Encounter for general adult medical examination without abnormal findings: Secondary | ICD-10-CM

## 2017-02-03 DIAGNOSIS — R000 Tachycardia, unspecified: Secondary | ICD-10-CM

## 2017-02-03 DIAGNOSIS — K219 Gastro-esophageal reflux disease without esophagitis: Secondary | ICD-10-CM

## 2017-02-03 DIAGNOSIS — E1149 Type 2 diabetes mellitus with other diabetic neurological complication: Secondary | ICD-10-CM | POA: Diagnosis not present

## 2017-02-03 DIAGNOSIS — F329 Major depressive disorder, single episode, unspecified: Secondary | ICD-10-CM

## 2017-02-03 DIAGNOSIS — M2550 Pain in unspecified joint: Secondary | ICD-10-CM

## 2017-02-03 DIAGNOSIS — M797 Fibromyalgia: Secondary | ICD-10-CM | POA: Insufficient documentation

## 2017-02-03 DIAGNOSIS — R5383 Other fatigue: Secondary | ICD-10-CM | POA: Insufficient documentation

## 2017-02-03 LAB — CBC WITH DIFFERENTIAL/PLATELET
BASOS ABS: 0.2 10*3/uL — AB (ref 0.0–0.1)
Basophils Relative: 1.3 % (ref 0.0–3.0)
EOS ABS: 1.3 10*3/uL — AB (ref 0.0–0.7)
Eosinophils Relative: 10.1 % — ABNORMAL HIGH (ref 0.0–5.0)
HCT: 34.8 % — ABNORMAL LOW (ref 36.0–46.0)
Hemoglobin: 10.8 g/dL — ABNORMAL LOW (ref 12.0–15.0)
LYMPHS ABS: 2.8 10*3/uL (ref 0.7–4.0)
LYMPHS PCT: 22.2 % (ref 12.0–46.0)
MCHC: 31 g/dL (ref 30.0–36.0)
MCV: 66.3 fl — ABNORMAL LOW (ref 78.0–100.0)
Monocytes Absolute: 0.8 10*3/uL (ref 0.1–1.0)
Monocytes Relative: 6.6 % (ref 3.0–12.0)
NEUTROS ABS: 7.6 10*3/uL (ref 1.4–7.7)
NEUTROS PCT: 59.8 % (ref 43.0–77.0)
PLATELETS: 573 10*3/uL — AB (ref 150.0–400.0)
RBC: 5.25 Mil/uL — ABNORMAL HIGH (ref 3.87–5.11)
RDW: 20.4 % — ABNORMAL HIGH (ref 11.5–15.5)
WBC: 12.7 10*3/uL — ABNORMAL HIGH (ref 4.0–10.5)

## 2017-02-03 LAB — COMPREHENSIVE METABOLIC PANEL
ALT: 12 U/L (ref 0–35)
AST: 12 U/L (ref 0–37)
Albumin: 4.2 g/dL (ref 3.5–5.2)
Alkaline Phosphatase: 134 U/L — ABNORMAL HIGH (ref 39–117)
BILIRUBIN TOTAL: 0.2 mg/dL (ref 0.2–1.2)
BUN: 10 mg/dL (ref 6–23)
CALCIUM: 9.7 mg/dL (ref 8.4–10.5)
CO2: 27 meq/L (ref 19–32)
CREATININE: 0.73 mg/dL (ref 0.40–1.20)
Chloride: 103 mEq/L (ref 96–112)
GFR: 94.66 mL/min (ref >60.00)
GLUCOSE: 92 mg/dL (ref 70–99)
Potassium: 4 mEq/L (ref 3.5–5.1)
Sodium: 138 mEq/L (ref 135–145)
Total Protein: 7.7 g/dL (ref 6.0–8.3)

## 2017-02-03 LAB — LIPID PANEL
Cholesterol: 161 mg/dL (ref 0–200)
HDL: 58.6 mg/dL (ref >39.00)
LDL Cholesterol: 80 mg/dL (ref 0–99)
NonHDL: 102.14
TRIGLYCERIDES: 113 mg/dL (ref 0.0–149.0)
Total CHOL/HDL Ratio: 3
VLDL: 22.6 mg/dL (ref 0.0–40.0)

## 2017-02-03 LAB — MICROALBUMIN / CREATININE URINE RATIO
CREATININE, U: 151.9 mg/dL
MICROALB/CREAT RATIO: 0.5 mg/g (ref 0.0–30.0)
Microalb, Ur: <0.7 mg/dL (ref 0.0–1.9)

## 2017-02-03 LAB — SEDIMENTATION RATE: Sed Rate: 112 mm/hr — ABNORMAL HIGH (ref 0–20)

## 2017-02-03 LAB — C-REACTIVE PROTEIN: CRP: 1.7 mg/dL (ref 0.5–20.0)

## 2017-02-03 LAB — TSH: TSH: 1.93 u[IU]/mL (ref 0.35–4.50)

## 2017-02-03 MED ORDER — AMPHETAMINE-DEXTROAMPHET ER 20 MG PO CP24: 20.0000 mg | ORAL_CAPSULE | Freq: Every day | ORAL | 0 refills | Status: DC

## 2017-02-03 NOTE — Patient Instructions (Signed)
Test(s) ordered today. Your results will be released to [place] (or called to you) after review, usually within 72hours after test completion. If any changes need to be made, you will be notified at that same time.  All other Health Maintenance issues reviewed.   All recommended immunizations and age-appropriate screenings are up-to-date or discussed.  No immunizations administered today.   Medications reviewed and updated.  No changes recommended at this time.  Your prescription(s) have been submitted to your pharmacy. Please take as directed and contact our office if you believe you are having problem(s) with the medication(s).  A referral was ordered for pulmonary  Please followup in 6 months   Health Maintenance, Female Adopting a healthy lifestyle and getting preventive care can go a long way to promote health and wellness. Talk with your health care provider about what schedule of regular examinations is right for you. This is a good chance for you to check in with your provider about disease prevention and staying healthy. In between checkups, there are plenty of things you can do on your own. Experts have done a lot of research about which lifestyle changes and preventive measures are most likely to keep you healthy. Ask your health care provider for more information. Weight and diet Eat a healthy diet  Be sure to include plenty of vegetables, fruits, low-fat dairy products, and lean protein.  Do not eat a lot of foods high in solid fats, added sugars, or salt.  Get regular exercise. This is one of the most important things you can do for your health. ? Most adults should exercise for at least 150 minutes each week. The exercise should increase your heart rate and make you sweat (moderate-intensity exercise). ? Most adults should also do strengthening exercises at least twice a week. This is in addition to the moderate-intensity exercise.  Maintain a healthy weight  Body mass  index (BMI) is a measurement that can be used to identify possible weight problems. It estimates body fat based on height and weight. Your health care provider can help determine your BMI and help you achieve or maintain a healthy weight.  For females 51 years of age and older: ? A BMI below 18.5 is considered underweight. ? A BMI of 18.5 to 24.9 is normal. ? A BMI of 25 to 29.9 is considered overweight. ? A BMI of 30 and above is considered obese.  Watch levels of cholesterol and blood lipids  You should start having your blood tested for lipids and cholesterol at 38 years of age, then have this test every 5 years.  You may need to have your cholesterol levels checked more often if: ? Your lipid or cholesterol levels are high. ? You are older than 38 years of age. ? You are at high risk for heart disease.  Cancer screening Lung Cancer  Lung cancer screening is recommended for adults 22-52 years old who are at high risk for lung cancer because of a history of smoking.  A yearly low-dose CT scan of the lungs is recommended for people who: ? Currently smoke. ? Have quit within the past 15 years. ? Have at least a 30-pack-year history of smoking. A pack year is smoking an average of one pack of cigarettes a day for 1 year.  Yearly screening should continue until it has been 15 years since you quit.  Yearly screening should stop if you develop a health problem that would prevent you from having lung cancer treatment.  Breast Cancer  Practice breast self-awareness. This means understanding how your breasts normally appear and feel.  It also means doing regular breast self-exams. Let your health care provider know about any changes, no matter how small.  If you are in your 20s or 30s, you should have a clinical breast exam (CBE) by a health care provider every 1-3 years as part of a regular health exam.  If you are 40 or older, have a CBE every year. Also consider having a breast  X-ray (mammogram) every year.  If you have a family history of breast cancer, talk to your health care provider about genetic screening.  If you are at high risk for breast cancer, talk to your health care provider about having an MRI and a mammogram every year.  Breast cancer gene (BRCA) assessment is recommended for women who have family members with BRCA-related cancers. BRCA-related cancers include: ? Breast. ? Ovarian. ? Tubal. ? Peritoneal cancers.  Results of the assessment will determine the need for genetic counseling and BRCA1 and BRCA2 testing.  Cervical Cancer Your health care provider may recommend that you be screened regularly for cancer of the pelvic organs (ovaries, uterus, and vagina). This screening involves a pelvic examination, including checking for microscopic changes to the surface of your cervix (Pap test). You may be encouraged to have this screening done every 3 years, beginning at age 37.  For women ages 6-65, health care providers may recommend pelvic exams and Pap testing every 3 years, or they may recommend the Pap and pelvic exam, combined with testing for human papilloma virus (HPV), every 5 years. Some types of HPV increase your risk of cervical cancer. Testing for HPV may also be done on women of any age with unclear Pap test results.  Other health care providers may not recommend any screening for nonpregnant women who are considered low risk for pelvic cancer and who do not have symptoms. Ask your health care provider if a screening pelvic exam is right for you.  If you have had past treatment for cervical cancer or a condition that could lead to cancer, you need Pap tests and screening for cancer for at least 20 years after your treatment. If Pap tests have been discontinued, your risk factors (such as having a new sexual partner) need to be reassessed to determine if screening should resume. Some women have medical problems that increase the chance of  getting cervical cancer. In these cases, your health care provider may recommend more frequent screening and Pap tests.  Colorectal Cancer  This type of cancer can be detected and often prevented.  Routine colorectal cancer screening usually begins at 38 years of age and continues through 38 years of age.  Your health care provider may recommend screening at an earlier age if you have risk factors for colon cancer.  Your health care provider may also recommend using home test kits to check for hidden blood in the stool.  A small camera at the end of a tube can be used to examine your colon directly (sigmoidoscopy or colonoscopy). This is done to check for the earliest forms of colorectal cancer.  Routine screening usually begins at age 28.  Direct examination of the colon should be repeated every 5-10 years through 38 years of age. However, you may need to be screened more often if early forms of precancerous polyps or small growths are found.  Skin Cancer  Check your skin from head to toe regularly.  Tell  your health care provider about any new moles or changes in moles, especially if there is a change in a mole's shape or color.  Also tell your health care provider if you have a mole that is larger than the size of a pencil eraser.  Always use sunscreen. Apply sunscreen liberally and repeatedly throughout the day.  Protect yourself by wearing long sleeves, pants, a wide-brimmed hat, and sunglasses whenever you are outside.  Heart disease, diabetes, and high blood pressure  High blood pressure causes heart disease and increases the risk of stroke. High blood pressure is more likely to develop in: ? People who have blood pressure in the high end of the normal range (130-139/85-89 mm Hg). ? People who are overweight or obese. ? People who are African American.  If you are 39-65 years of age, have your blood pressure checked every 3-5 years. If you are 20 years of age or older,  have your blood pressure checked every year. You should have your blood pressure measured twice-once when you are at a hospital or clinic, and once when you are not at a hospital or clinic. Record the average of the two measurements. To check your blood pressure when you are not at a hospital or clinic, you can use: ? An automated blood pressure machine at a pharmacy. ? A home blood pressure monitor.  If you are between 56 years and 32 years old, ask your health care provider if you should take aspirin to prevent strokes.  Have regular diabetes screenings. This involves taking a blood sample to check your fasting blood sugar level. ? If you are at a normal weight and have a low risk for diabetes, have this test once every three years after 38 years of age. ? If you are overweight and have a high risk for diabetes, consider being tested at a younger age or more often. Preventing infection Hepatitis B  If you have a higher risk for hepatitis B, you should be screened for this virus. You are considered at high risk for hepatitis B if: ? You were born in a country where hepatitis B is common. Ask your health care provider which countries are considered high risk. ? Your parents were born in a high-risk country, and you have not been immunized against hepatitis B (hepatitis B vaccine). ? You have HIV or AIDS. ? You use needles to inject street drugs. ? You live with someone who has hepatitis B. ? You have had sex with someone who has hepatitis B. ? You get hemodialysis treatment. ? You take certain medicines for conditions, including cancer, organ transplantation, and autoimmune conditions.  Hepatitis C  Blood testing is recommended for: ? Everyone born from [birth year] through 1967. ? Anyone with known risk factors for hepatitis C.  Sexually transmitted infections (STIs)  You should be screened for sexually transmitted infections (STIs) including gonorrhea and chlamydia if: ? You are sexually  active and are younger than 37 years of age. ? You are older than 38 years of age and your health care provider tells you that you are at risk for this type of infection. ? Your sexual activity has changed since you were last screened and you are at an increased risk for chlamydia or gonorrhea. Ask your health care provider if you are at risk.  If you do not have HIV, but are at risk, it may be recommended that you take a prescription medicine daily to prevent HIV infection. This is called pre-exposure  prophylaxis (PrEP). You are considered at risk if: ? You are sexually active and do not regularly use condoms or know the HIV status of your partner(s). ? You take drugs by injection. ? You are sexually active with a partner who has HIV.  Talk with your health care provider about whether you are at high risk of being infected with HIV. If you choose to begin PrEP, you should first be tested for HIV. You should then be tested every 3 months for as long as you are taking PrEP. Pregnancy  If you are premenopausal and you may become pregnant, ask your health care provider about preconception counseling.  If you may become pregnant, take 400 to 800 micrograms (mcg) of folic acid every day.  If you want to prevent pregnancy, talk to your health care provider about birth control (contraception). Osteoporosis and menopause  Osteoporosis is a disease in which the bones lose minerals and strength with aging. This can result in serious bone fractures. Your risk for osteoporosis can be identified using a bone density scan.  If you are 65 years of age or older, or if you are at risk for osteoporosis and fractures, ask your health care provider if you should be screened.  Ask your health care provider whether you should take a calcium or vitamin D supplement to lower your risk for osteoporosis.  Menopause may have certain physical symptoms and risks.  Hormone replacement therapy may reduce some of these  symptoms and risks. Talk to your health care provider about whether hormone replacement therapy is right for you. Follow these instructions at home:  Schedule regular health, dental, and eye exams.  Stay current with your immunizations.  Do not use any tobacco products including cigarettes, chewing tobacco, or electronic cigarettes.  If you are pregnant, do not drink alcohol.  If you are breastfeeding, limit how much and how often you drink alcohol.  Limit alcohol intake to no more than 1 drink per day for nonpregnant women. One drink equals 12 ounces of beer, 5 ounces of wine, or 1 ounces of hard liquor.  Do not use street drugs.  Do not share needles.  Ask your health care provider for help if you need support or information about quitting drugs.  Tell your health care provider if you often feel depressed.  Tell your health care provider if you have ever been abused or do not feel safe at home. This information is not intended to replace advice given to you by your health care provider. Make sure you discuss any questions you have with your health care provider. Document Released: 02/10/2011 Document Revised: 01/03/2016 Document Reviewed: 05/01/2015 Elsevier Interactive Patient Education  [employer].

## 2017-02-03 NOTE — Assessment & Plan Note (Signed)
Improved, rarely has a migraine Uses phenergan, axert as needed ultracet as needed if above is not effective Continue same

## 2017-02-03 NOTE — Assessment & Plan Note (Signed)
Persistent We'll check routine blood work, but in the past has been normal Non-refreshing sleep-we'll refer to pulmonary to rule out sleep apnea despite no obvious snoring or apnea Checking autoimmune blood work

## 2017-02-03 NOTE — Progress Notes (Signed)
Subjective:    Patient ID: Lisa Duran, female    DOB: [birth year], 36 y.o.   MRN: [id number]  HPI She is here for a physical exam.   Her sugars are better controlled.   She is following with endocrine.  She still has chronic persistent exhaustion.  She has whole body aches.  She is sleeping more - she sleeps 10-7  and does not feel refreshed.  She does not snore -  She has asked her husband.  There is no witnessed apnea.   Chronic pain:  She has b/l hip pain, lower back pain, between shoulder blades, inside of knees and b/l forearms.   She has muscular pain in her upper back, forearms and legs.  She gets a desk massage at work every two weeks and she can only have light rubbing because massage hurts. She denies joint swelling.  Her pain is intermittent. The past three days has been worse-it tends to flare. After exerting herself she is more sore.   She does yoga 2-3 times a week.   Medications and allergies reviewed with patient and updated if appropriate.  Patient Active Problem List   Diagnosis Date Noted  . Chronic fatigue 02/03/2017  . Fracture of fifth toe, right, closed 02/19/2016  . GERD (gastroesophageal reflux disease) 06/24/2015  . Eczema 06/22/2015  . Depression 06/22/2015  . Myofascial pain 06/16/2014  . Tachycardia 06/16/2014  . Paresthesias 06/12/2014  . Food allergy   . Wrist pain, right 09/14/2013  . Elbow pain, right 09/05/2013  . ADHD (attention deficit hyperactivity disorder) 01/27/2011  . Anxiety 02/20/2010  . ALLERGIC RHINITIS 11/21/2009  . OVARIAN CYST 11/21/2009  . Diabetes mellitus type 2 with neurological manifestations ([place]) 11/20/2009  . Obesity, unspecified 11/20/2009  . Migraine without aura 11/20/2009    Current Outpatient Prescriptions on File Prior to Visit  Medication Sig Dispense Refill  . almotriptan (AXERT) 12.5 MG tablet Take 1 tablet (12.5 mg total) by mouth as needed for migraine. may repeat in 2 hours if needed 12 tablet 3  .  ALPRAZolam (XANAX) 0.5 MG tablet TAKE 1 TABLET BY MOUTH THREE TIMES DAILY AS NEEDED FOR ANXIETY 30 tablet 5  . amphetamine-dextroamphetamine (ADDERALL XR) 20 MG 24 hr capsule Take 1 capsule (20 mg total) by mouth daily. 30 capsule 0  . BD PEN NEEDLE NANO U/F 32G X 4 MM MISC USE ONCE A DAY 90 each 3  . citalopram (CELEXA) 20 MG tablet TAKE 1 TABLET BY MOUTH  DAILY 90 tablet 1  . fluticasone (FLONASE) 50 MCG/ACT nasal spray Place 2 sprays into both nostrils daily.   1  . glipiZIDE (GLUCOTROL XL) 5 MG 24 hr tablet Take 1 tablet (5 mg total) by mouth daily with breakfast. 90 tablet 3  . glucose blood (ONETOUCH VERIO) test strip Use 2x a day 100 each 12  . Insulin Glargine (LANTUS SOLOSTAR) 100 UNIT/ML Solostar Pen Inject 32 Units into the skin at bedtime. 30 mL 1  . Insulin Pen Needle 32G X 4 MM MISC Use 1x a day 100 each 3  . JANUVIA 100 MG tablet TAKE 1 TABLET BY MOUTH  DAILY 90 tablet 1  . lamoTRIgine (LAMICTAL) 100 MG tablet TAKE 1 TABLET BY MOUTH TWO  TIMES DAILY 180 tablet 1  . levonorgestrel (MIRENA) 20 MCG/24HR IUD 1 Intra Uterine Device (1 each total) by Intrauterine route once. 1 each 0  . metFORMIN (GLUCOPHAGE) 1000 MG tablet TAKE 1/2 TABLET BY MOUTH  TWICE A  DAY WITH A MEAL 90 tablet 1  . metoprolol succinate (TOPROL-XL) 25 MG 24 hr tablet Take 0.5 tablets (12.5 mg total) by mouth daily. 45 tablet 1  . omeprazole (PRILOSEC) 40 MG capsule TAKE 1 CAPSULE BY MOUTH  DAILY BEFORE SUPPER. 90 capsule 3  . Prenatal Vit-Fe Fumarate-FA (MULTIVITAMIN-PRENATAL) 27-0.8 MG TABS tablet Take 1 tablet by mouth daily at 12 noon.    . promethazine (PHENERGAN) 25 MG tablet Take 25 mg by mouth every 6 (six) hours as needed for nausea.    . traMADol-acetaminophen (ULTRACET) 37.5-325 MG per tablet Take 1 tablet by mouth every 6 (six) hours as needed for pain.    [name] triamcinolone cream (KENALOG) 0.1 % Apply topically 2 (two) times daily as needed. 30 g 1   No current facility-administered medications on file  prior to visit.     Past Medical History:  Diagnosis Date  . ADHD (attention deficit hyperactivity disorder)   . ALLERGIC RHINITIS   . Allergy   . Anemia   . ANXIETY   . Depression   . DIABETES MELLITUS, TYPE II   . GERD (gastroesophageal reflux disease)   . MIGRAINE, COMMON   . Obesity, unspecified   . OVARIAN CYST     Past Surgical History:  Procedure Laterality Date  . NO PAST SURGERIES      Social History   Social History  . Marital status: Married    Spouse name: N/A  . Number of children: N/A  . Years of education: N/A   Social History Main Topics  . Smoking status: Former Smoker    Quit date: 08/20/2002  . Smokeless tobacco: Never Used     Comment: single, separated from spouse 02/2010.   [name] Alcohol use Yes     Comment: occasionally  . Drug use: No  . Sexual activity: Yes    Birth control/ protection: IUD   Other Topics Concern  . Not on file   Social History Narrative   Single, separated from spouse 02/2010    Family History  Problem Relation Age of Onset  . Diabetes Mother   . Hyperlipidemia Mother   . Diabetes Father   . Hyperlipidemia Father   . Coronary artery disease Father   . Hypertension Father   . Cancer Father        esoph  . Stroke Father   . Cancer Maternal Grandfather 1       lung, met    Review of Systems  Constitutional: Positive for fatigue. Negative for appetite change, chills and fever.  Eyes: Negative for visual disturbance.  Respiratory: Negative for cough, shortness of breath and wheezing.   Cardiovascular: Negative for chest pain, palpitations and leg swelling.  Gastrointestinal: Negative for abdominal pain, blood in stool, constipation, diarrhea and nausea.       Gerd controlled  Genitourinary: Negative for dysuria and hematuria.  Musculoskeletal: Positive for arthralgias and myalgias. Negative for joint swelling.  Skin: Negative for color change and rash.       Bites turn into hyperpigmented lesions  Neurological:  Positive for light-headedness (with activity sometimes or when she does not feel good). Negative for headaches.  Psychiatric/Behavioral: Positive for dysphoric mood (controlled). The patient is nervous/anxious (controlled).        Objective:   Vitals:   02/03/17 1542  BP: 102/76  Pulse: 96  Resp: 16  Temp: 98.8 F (37.1 C)   Filed Weights   02/03/17 1542  Weight: 214 lb (97.1 kg)  Body mass index is 36.17 kg/m.  Wt Readings from Last 3 Encounters:  02/03/17 214 lb (97.1 kg)  12/25/16 213 lb (96.6 kg)  10/16/16 215 lb (97.5 kg)     Physical Exam Constitutional: She appears well-developed and well-nourished. No distress.  HENT:  Head: Normocephalic and atraumatic.  Right Ear: External ear normal. Normal ear canal and TM Left Ear: External ear normal.  Normal ear canal and TM Mouth/Throat: Oropharynx is clear and moist.  Eyes: Conjunctivae and EOM are normal.  Neck: Neck supple. No tracheal deviation present. No thyromegaly present.  No carotid bruit  Cardiovascular: Normal rate, regular rhythm and normal heart sounds.   No murmur heard.  No edema. Pulmonary/Chest: Effort normal and breath sounds normal. No respiratory distress. She has no wheezes. She has no rales.  Breast: deferred to Gyn Abdominal: Soft. She exhibits no distension. There is no tenderness.   Musculoskeletal: Muscle tenderness upper back, forearms and calves; no joint swelling or deformities Lymphadenopathy: She has no cervical adenopathy.  Skin: Skin is warm and dry. She is not diaphoretic.  Psychiatric: She has a normal mood and affect. Her behavior is normal.        Assessment & Plan:   Physical exam: Screening blood work   ordered Immunizations    Up-to-date Gyn Up to date  Eye Exam:  Up to date  Exercise  -  Yoga 2-3 times a week, continue and increase if possible Weight  -  Work on weight loss Skin   No concerns Substance abuse  none  See Problem List for Assessment and Plan of  chronic medical problems.  F/u 6 months

## 2017-02-03 NOTE — Assessment & Plan Note (Addendum)
GERD controlled Continue daily medication - She has tried tapering to a lower dose and her symptoms returned - continue current dose daily

## 2017-02-03 NOTE — Assessment & Plan Note (Addendum)
Having some muscle pain as well as joint pain Check ANA, rheumatoid factor and ESR and CRP Possible fibromyalgia, possible autoimmune arthritis Depending on blood work consider rheumatology referral Continue regular exercise Work on weight loss  consider changing Celexa to Cymbalta-she will think about this

## 2017-02-03 NOTE — Assessment & Plan Note (Signed)
Takes celexa daily Takes xanax as needed - only took in once last week controlled

## 2017-02-03 NOTE — Assessment & Plan Note (Signed)
Controlled, stable Continue current dose of medication  

## 2017-02-03 NOTE — Assessment & Plan Note (Signed)
Possible fibromyalgia Also experiencing joint pain Check autoimmune blood work Consider rheumatology referral Continue regular exercise Consider changing Celexa to Cymbalta

## 2017-02-03 NOTE — Assessment & Plan Note (Signed)
Following with Dr Cruzita Lederer Sugars better controlled Managed by Dr Cruzita Lederer

## 2017-02-03 NOTE — Assessment & Plan Note (Signed)
Controlled with metoprolol-she does feel better on the medication Continue current dose

## 2017-02-04 LAB — ANA: ANA: NEGATIVE

## 2017-02-04 LAB — RHEUMATOID FACTOR

## 2017-02-05 ENCOUNTER — Encounter: Payer: Self-pay | Admitting: Internal Medicine

## 2017-02-05 DIAGNOSIS — R700 Elevated erythrocyte sedimentation rate: Secondary | ICD-10-CM

## 2017-02-05 DIAGNOSIS — D72829 Elevated white blood cell count, unspecified: Secondary | ICD-10-CM

## 2017-02-05 DIAGNOSIS — M2550 Pain in unspecified joint: Secondary | ICD-10-CM

## 2017-02-05 DIAGNOSIS — R0681 Apnea, not elsewhere classified: Secondary | ICD-10-CM

## 2017-02-05 DIAGNOSIS — D649 Anemia, unspecified: Secondary | ICD-10-CM

## 2017-02-05 DIAGNOSIS — R5383 Other fatigue: Secondary | ICD-10-CM

## 2017-02-05 DIAGNOSIS — D696 Thrombocytopenia, unspecified: Secondary | ICD-10-CM

## 2017-02-07 NOTE — Addendum Note (Signed)
Addended by: Binnie Rail on: 02/07/2017 02:34 PM   Modules accepted: Orders

## 2017-02-09 ENCOUNTER — Other Ambulatory Visit: Payer: Self-pay | Admitting: Internal Medicine

## 2017-02-10 NOTE — Telephone Encounter (Signed)
[place] Controlled Substance Database checked. Last refill 01/14/17

## 2017-02-10 NOTE — Telephone Encounter (Signed)
LVM for pt to call back and discuss.  

## 2017-02-10 NOTE — Telephone Encounter (Signed)
Ok, rx printed.

## 2017-02-10 NOTE — Addendum Note (Signed)
Addended by: Binnie Rail on: 02/10/2017 08:27 PM   Modules accepted: Orders

## 2017-02-10 NOTE — Telephone Encounter (Signed)
RX faxed to [facility] 

## 2017-02-13 ENCOUNTER — Other Ambulatory Visit: Payer: Self-pay | Admitting: Internal Medicine

## 2017-02-19 ENCOUNTER — Encounter: Payer: Self-pay | Admitting: Internal Medicine

## 2017-02-20 ENCOUNTER — Encounter: Payer: Self-pay | Admitting: Hematology

## 2017-02-20 ENCOUNTER — Telehealth: Payer: Self-pay | Admitting: Hematology

## 2017-02-20 NOTE — Telephone Encounter (Signed)
Pt cld to schedule an appt w/Dr. Irene Limbo on 8/3 at 11am. Pt aware to arrive 30 minutes early. Letter mailed.

## 2017-02-24 ENCOUNTER — Encounter: Payer: Self-pay | Admitting: Internal Medicine

## 2017-02-24 NOTE — Telephone Encounter (Signed)
Stanton Kidney will you check on this, it looks like [facility] faxed the referral info but they did not receive it.

## 2017-03-05 DIAGNOSIS — M533 Sacrococcygeal disorders, not elsewhere classified: Secondary | ICD-10-CM | POA: Diagnosis not present

## 2017-03-05 DIAGNOSIS — M2550 Pain in unspecified joint: Secondary | ICD-10-CM | POA: Diagnosis not present

## 2017-03-05 DIAGNOSIS — R700 Elevated erythrocyte sedimentation rate: Secondary | ICD-10-CM | POA: Diagnosis not present

## 2017-03-13 ENCOUNTER — Encounter: Payer: Self-pay | Admitting: Hematology

## 2017-03-13 ENCOUNTER — Telehealth: Payer: Self-pay | Admitting: Hematology

## 2017-03-13 ENCOUNTER — Ambulatory Visit (HOSPITAL_BASED_OUTPATIENT_CLINIC_OR_DEPARTMENT_OTHER): Payer: BLUE CROSS/BLUE SHIELD | Admitting: Hematology

## 2017-03-13 ENCOUNTER — Ambulatory Visit (HOSPITAL_BASED_OUTPATIENT_CLINIC_OR_DEPARTMENT_OTHER): Payer: BLUE CROSS/BLUE SHIELD

## 2017-03-13 VITALS — BP 110/71 | HR 99 | Temp 98.7°F | Resp 18 | Ht 64.5 in | Wt 212.8 lb

## 2017-03-13 DIAGNOSIS — E119 Type 2 diabetes mellitus without complications: Secondary | ICD-10-CM | POA: Diagnosis not present

## 2017-03-13 DIAGNOSIS — E538 Deficiency of other specified B group vitamins: Secondary | ICD-10-CM

## 2017-03-13 DIAGNOSIS — D509 Iron deficiency anemia, unspecified: Secondary | ICD-10-CM

## 2017-03-13 DIAGNOSIS — D473 Essential (hemorrhagic) thrombocythemia: Secondary | ICD-10-CM | POA: Diagnosis not present

## 2017-03-13 DIAGNOSIS — D72829 Elevated white blood cell count, unspecified: Secondary | ICD-10-CM | POA: Diagnosis not present

## 2017-03-13 DIAGNOSIS — D75839 Thrombocytosis, unspecified: Secondary | ICD-10-CM

## 2017-03-13 DIAGNOSIS — Z87891 Personal history of nicotine dependence: Secondary | ICD-10-CM

## 2017-03-13 DIAGNOSIS — R5383 Other fatigue: Secondary | ICD-10-CM

## 2017-03-13 DIAGNOSIS — Z801 Family history of malignant neoplasm of trachea, bronchus and lung: Secondary | ICD-10-CM

## 2017-03-13 DIAGNOSIS — Z808 Family history of malignant neoplasm of other organs or systems: Secondary | ICD-10-CM

## 2017-03-13 DIAGNOSIS — Z8042 Family history of malignant neoplasm of prostate: Secondary | ICD-10-CM

## 2017-03-13 DIAGNOSIS — F909 Attention-deficit hyperactivity disorder, unspecified type: Secondary | ICD-10-CM

## 2017-03-13 LAB — CBC & DIFF AND RETIC
BASO%: 0.5 % (ref 0.0–2.0)
BASOS ABS: 0.1 10*3/uL (ref 0.0–0.1)
EOS ABS: 0.4 10*3/uL (ref 0.0–0.5)
EOS%: 4.1 % (ref 0.0–7.0)
HCT: 33.9 % — ABNORMAL LOW (ref 34.8–46.6)
HEMOGLOBIN: 10.3 g/dL — AB (ref 11.6–15.9)
IMMATURE RETIC FRACT: 13.9 % — AB (ref 1.60–10.00)
LYMPH%: 22.2 % (ref 14.0–49.7)
MCH: 20.8 pg — AB (ref 25.1–34.0)
MCHC: 30.4 g/dL — AB (ref 31.5–36.0)
MCV: 68.3 fL — AB (ref 79.5–101.0)
MONO#: 0.7 10*3/uL (ref 0.1–0.9)
MONO%: 7.2 % (ref 0.0–14.0)
NEUT#: 6.1 10*3/uL (ref 1.5–6.5)
NEUT%: 66 % (ref 38.4–76.8)
NRBC: 0 % (ref 0–0)
Platelets: 447 10*3/uL — ABNORMAL HIGH (ref 145–400)
RBC: 4.96 10*6/uL (ref 3.70–5.45)
RDW: 18.6 % — AB (ref 11.2–14.5)
Retic %: 1.11 % (ref 0.70–2.10)
Retic Ct Abs: 55.06 10*3/uL (ref 33.70–90.70)
WBC: 9.3 10*3/uL (ref 3.9–10.3)
lymph#: 2.1 10*3/uL (ref 0.9–3.3)

## 2017-03-13 LAB — IRON AND TIBC
%SAT: 4 % — AB (ref 21–57)
Iron: 16 ug/dL — ABNORMAL LOW (ref 41–142)
TIBC: 383 ug/dL (ref 236–444)
UIBC: 367 ug/dL (ref 120–384)

## 2017-03-13 LAB — COMPREHENSIVE METABOLIC PANEL
ALT: 20 U/L (ref 0–55)
ANION GAP: 8 meq/L (ref 3–11)
AST: 16 U/L (ref 5–34)
Albumin: 3.5 g/dL (ref 3.5–5.0)
Alkaline Phosphatase: 140 U/L (ref 40–150)
BUN: 9 mg/dL (ref 7.0–26.0)
CHLORIDE: 104 meq/L (ref 98–109)
CO2: 26 meq/L (ref 22–29)
Calcium: 9.4 mg/dL (ref 8.4–10.4)
Creatinine: 0.8 mg/dL (ref 0.6–1.1)
GLUCOSE: 89 mg/dL (ref 70–140)
POTASSIUM: 4.1 meq/L (ref 3.5–5.1)
SODIUM: 138 meq/L (ref 136–145)
TOTAL PROTEIN: 7.6 g/dL (ref 6.4–8.3)
Total Bilirubin: 0.26 mg/dL (ref 0.20–1.20)

## 2017-03-13 LAB — LACTATE DEHYDROGENASE: LDH: 144 U/L (ref 125–245)

## 2017-03-13 LAB — FERRITIN: FERRITIN: 15 ng/mL (ref 9–269)

## 2017-03-13 NOTE — Telephone Encounter (Signed)
Scheduled appt per 8/3 los - Gave patient AVS and calender per los.  

## 2017-03-13 NOTE — Progress Notes (Signed)
[name]    HEMATOLOGY/ONCOLOGY CONSULTATION NOTE  Date of Service: 03/13/2017  Patient Care Team: Binnie Rail, MD as PCP - General (Internal Medicine) Servando Salina, MD as Consulting Physician (Obstetrics and Gynecology) Harold Hedge, Darrick Grinder, MD (Allergy and Immunology) Philemon Kingdom, MD (Endocrinology) Dennard Nip, NP as Nurse Practitioner (Psychiatry) Calvert Cantor, MD (Ophthalmology) Pieter Partridge, DO (Neurology)  CHIEF COMPLAINTS/PURPOSE OF CONSULTATION:  Anemia and thrombocytosis  HISTORY OF PRESENTING ILLNESS:   Lisa Duran is a wonderful 36 y.o. female who has been referred to [place] by Dr .Lisa Duran, Lisa Lick, MD for evaluation and management of evaluation of Anemia and thrombocytosis.  Patient has h/o fibromyalga, significant eczema, possible IBS (self decided to pursue gluten free diet), DM2, ADHD, anxiety, obesity had labs with her PCP On 02/03/2017 and was noted to have anemia with a hgb of 10.8 with MCV of 66.3 with elevated platelets of 573k with borderline leucocytosis of 12.7 with  Eosinophilia of 1.3k.  Hgb 1  Yr ago was 11.6 with MCV of 74.6 and platelets of 538k  She has had significantly elevated inflammatory markers (sed rate 112) and has been in the process of having a rheumatology w/u with Lisa Pound MD.  No fatigue but no evident LNadenopathy, splenomegaly . No fevers/chills. Notes that she has had a Mirena IUD and notes no significant menstrual losses. No other overt bleeding. Has been on chronic PPI therapy. No overt weight loss or other focal symptoms suggestive of malignancy. Patient has previosuly had EGD in 09/2015 that showed mild pan gastritis and HH without cameron ulcers.   MEDICAL HISTORY:  Past Medical History:  Diagnosis Date  . ADHD (attention deficit hyperactivity disorder)   . ALLERGIC RHINITIS   . Allergy   . Anemia   . ANXIETY   . Depression   . DIABETES MELLITUS, TYPE II   . Fibromyalgia 2018  . GERD (gastroesophageal  reflux disease)   . MIGRAINE, COMMON   . Obesity, unspecified   . OVARIAN CYST     SURGICAL HISTORY: Past Surgical History:  Procedure Laterality Date  . NO PAST SURGERIES      SOCIAL HISTORY: Social History   Social History  . Marital status: Married    Spouse name: N/A  . Number of children: N/A  . Years of education: N/A   Occupational History  . Not on file.   Social History Main Topics  . Smoking status: Former Smoker    Quit date: 08/20/2002  . Smokeless tobacco: Never Used     Comment: single, separated from spouse 02/2010.   [name] Alcohol use Yes     Comment: rarely  . Drug use: No  . Sexual activity: Not Currently    Birth control/ protection: IUD   Other Topics Concern  . Not on file   Social History Narrative   Single, separated from spouse 02/2010    FAMILY HISTORY: Family History  Problem Relation Age of Onset  . Diabetes Mother   . Hyperlipidemia Mother   . Diabetes Father   . Hyperlipidemia Father   . Coronary artery disease Father   . Hypertension Father   . Cancer Father        esoph  . Stroke Father   . Cancer Maternal Grandfather 32       Prostate and Lung  . Diabetes Sister        Gestational  . Lisa Duran' disease Maternal Aunt   . Cancer Maternal Uncle  Brain  . Stroke Paternal Grandmother   . Heart disease Paternal Grandmother   . Stroke Paternal Grandfather   . Heart disease Paternal Grandfather   . Diabetes Sister        Gestational and Type II  . Hashimoto's thyroiditis Cousin   . Cancer Maternal Uncle        Brain    ALLERGIES:  is allergic to propofol; chicken allergy; gluten meal; other; and victoza [liraglutide].  MEDICATIONS:  Current Outpatient Prescriptions  Medication Sig Dispense Refill  . almotriptan (AXERT) 12.5 MG tablet Take 1 tablet (12.5 mg total) by mouth as needed for migraine. may repeat in 2 hours if needed 12 tablet 3  . ALPRAZolam (XANAX) 0.5 MG tablet TAKE 1 TABLET BY MOUTH THREE TIMES DAILY AS  NEEDED FOR ANXIETY 30 tablet 0  . amphetamine-dextroamphetamine (ADDERALL XR) 20 MG 24 hr capsule Take 1 capsule (20 mg total) by mouth daily. 30 capsule 0  . BD PEN NEEDLE NANO U/F 32G X 4 MM MISC USE ONCE A DAY 90 each 3  . citalopram (CELEXA) 20 MG tablet TAKE 1 TABLET BY MOUTH  DAILY 90 tablet 1  . fluticasone (FLONASE) 50 MCG/ACT nasal spray Place 2 sprays into both nostrils daily.   1  . glipiZIDE (GLUCOTROL XL) 5 MG 24 hr tablet Take 1 tablet (5 mg total) by mouth daily with breakfast. 90 tablet 3  . glucose blood (ONETOUCH VERIO) test strip Use 2x a day 100 each 12  . Insulin Glargine (LANTUS SOLOSTAR) 100 UNIT/ML Solostar Pen Inject 32 Units into the skin at bedtime. 30 mL 1  . Insulin Pen Needle 32G X 4 MM MISC Use 1x a day 100 each 3  . JANUVIA 100 MG tablet TAKE 1 TABLET BY MOUTH  DAILY 90 tablet 1  . lamoTRIgine (LAMICTAL) 100 MG tablet TAKE 1 TABLET BY MOUTH TWO  TIMES DAILY 180 tablet 1  . levonorgestrel (MIRENA) 20 MCG/24HR IUD 1 Intra Uterine Device (1 each total) by Intrauterine route once. 1 each 0  . metFORMIN (GLUCOPHAGE) 1000 MG tablet TAKE 1/2 TABLET BY MOUTH  TWICE A DAY WITH A MEAL 90 tablet 1  . metoprolol succinate (TOPROL-XL) 25 MG 24 hr tablet Take 0.5 tablets (12.5 mg total) by mouth daily. 45 tablet 1  . omeprazole (PRILOSEC) 40 MG capsule TAKE 1 CAPSULE BY MOUTH  DAILY BEFORE SUPPER. 90 capsule 3  . Prenatal Vit-Fe Fumarate-FA (MULTIVITAMIN-PRENATAL) 27-0.8 MG TABS tablet Take 1 tablet by mouth daily at 12 noon.    . promethazine (PHENERGAN) 25 MG tablet Take 25 mg by mouth every 6 (six) hours as needed for nausea.    . traMADol-acetaminophen (ULTRACET) 37.5-325 MG per tablet Take 1 tablet by mouth every 6 (six) hours as needed for pain.    [name] triamcinolone cream (KENALOG) 0.1 % Apply topically 2 (two) times daily as needed. 30 g 1   No current facility-administered medications for this visit.     REVIEW OF SYSTEMS:    10 Point review of Systems was done is  negative except as noted above.  PHYSICAL EXAMINATION: ECOG PERFORMANCE STATUS: 1 - Symptomatic but completely ambulatory  . Vitals:   03/13/17 1139  BP: 110/71  Pulse: 99  Resp: 18  Temp: 98.7 F (37.1 C)   Filed Weights   03/13/17 1139  Weight: 212 lb 12.8 oz (96.5 kg)   .Body mass index is 35.96 kg/m.  GENERAL:alert, in no acute distress and comfortable SKIN: no acute rashes,  no significant lesions EYES: conjunctiva are pink and non-injected, sclera anicteric OROPHARYNX: MMM, no exudates, no oropharyngeal erythema or ulceration NECK: supple, no JVD LYMPH:  no palpable lymphadenopathy in the cervical, axillary or inguinal regions LUNGS: clear to auscultation b/l with normal respiratory effort HEART: regular rate & rhythm ABDOMEN:  normoactive bowel sounds , non tender, not distended. No palpable hepatosplenomegaly. Extremity: no pedal edema PSYCH: alert & oriented x 3 with fluent speech NEURO: no focal motor/sensory deficits  LABORATORY DATA:  I have reviewed the data as listed  . CBC Latest Ref Rng & Units 03/13/2017 03/13/2017 02/03/2017  WBC 3.9 - 10.3 10e3/uL 9.3 - 12.7(H)  Hemoglobin 11.6 - 15.9 g/dL 10.3(L) - 10.8(L)  Hematocrit 34.0 - 46.6 % 33.9(L) 32.7(L) 34.8(L)  Platelets 145 - 400 10e3/uL 447(H) - 573.0(H)   . CBC    Component Value Date/Time   WBC 9.3 03/13/2017 1304   WBC 12.7 (H) 02/03/2017 1621   RBC 4.96 03/13/2017 1304   RBC 5.25 (H) 02/03/2017 1621   HGB 10.3 (L) 03/13/2017 1304   HCT 33.9 (L) 03/13/2017 1304   HCT 32.7 (L) 03/13/2017 1304   PLT 447 (H) 03/13/2017 1304   MCV 68.3 (L) 03/13/2017 1304   MCH 20.8 (L) 03/13/2017 1304   MCH 22.8 (L) 07/19/2014 1905   MCHC 30.4 (L) 03/13/2017 1304   MCHC 31.0 02/03/2017 1621   RDW 18.6 (H) 03/13/2017 1304   LYMPHSABS 2.1 03/13/2017 1304   MONOABS 0.7 03/13/2017 1304   EOSABS 0.4 03/13/2017 1304   BASOSABS 0.1 03/13/2017 1304     . CMP Latest Ref Rng & Units 03/13/2017 02/03/2017 12/20/2015    Glucose 70 - 140 mg/dl 89 92 229(H)  BUN 7.0 - 26.0 mg/dL 9.0 10 9  Creatinine 0.6 - 1.1 mg/dL 0.8 0.73 0.77  Sodium 136 - 145 mEq/L 138 138 136  Potassium 3.5 - 5.1 mEq/L 4.1 4.0 4.2  Chloride 96 - 112 mEq/L - 103 100  CO2 22 - 29 mEq/L 26 27 27   Calcium 8.4 - 10.4 mg/dL 9.4 9.7 9.4  Total Protein 6.4 - 8.3 g/dL 7.6 7.7 7.8  Total Bilirubin 0.20 - 1.20 mg/dL 0.26 0.2 0.3  Alkaline Phos 40 - 150 U/L 140 134(H) 122(H)  AST 5 - 34 U/L 16 12 21   ALT 0 - 55 U/L 20 12 28      RADIOGRAPHIC STUDIES: I have personally reviewed the radiological images as listed and agreed with the findings in the report. No results found.  ASSESSMENT & PLAN:   36 yo with    1) Chronic worsening Microcytic Anemia  Likely from iron deficiency vs Anemia from chronic inflammation  2) Chronic Thrombocytosis - likely reactive from Iron deficiency and inflammation. Given patients age and non progressive nature of her plt counts- much less likely to be a clonal MPN such as Essential thrombocytosis.  3) Mild leucocytosis - reacitve -- obesity, inflammation, Eczema, Adderall etc.  4) Eosinophilia - likely from allergies/Eczema -- now resolved. Plan  - patient medical details were reviewed in details with the patient. -we discussed the recommended workup and diagnostic possiblities and she is agreeable to the labs as noted below -lab w/u ordered as noted below -f/u with PCP to consider completion of GI workup for evaluation of Iron deficiency. -f/uw ith rheumatology for evaluation and management of possible rheumatological condition.  Labs today RTC with Dr Irene Limbo in 2 weeks  Orders Placed This Encounter  Procedures  . CBC & Diff and Retic  Standing Status:   Future    Number of Occurrences:   1    Standing Expiration Date:   03/13/2018  . Comprehensive metabolic panel    Standing Status:   Future    Number of Occurrences:   1    Standing Expiration Date:   03/13/2018  . Sedimentation rate     Standing Status:   Future    Number of Occurrences:   1    Standing Expiration Date:   03/13/2018  . C-reactive protein    Standing Status:   Future    Number of Occurrences:   1    Standing Expiration Date:   03/13/2018  . Lactate dehydrogenase    Standing Status:   Future    Number of Occurrences:   1    Standing Expiration Date:   03/13/2018  . Ferritin    Standing Status:   Future    Number of Occurrences:   1    Standing Expiration Date:   03/13/2018  . Iron and TIBC    Standing Status:   Future    Number of Occurrences:   1    Standing Expiration Date:   03/13/2018  . Vitamin B12    Standing Status:   Future    Number of Occurrences:   1    Standing Expiration Date:   03/13/2018  . Folate RBC    Standing Status:   Future    Number of Occurrences:   1    Standing Expiration Date:   03/13/2018    All of the patients questions were answered with apparent satisfaction. The patient knows to call the clinic with any problems, questions or concerns.  I spent 35 minutes counseling the patient face to face. The total time spent in the appointment was 50 minutes and more than 50% was on counseling and direct patient cares.    Sullivan Lone MD [place] [facility] [place] [facility] Hematology/Oncology Physician [facility]  (Office):       [phone] (Work cell):  [phone] (Fax):           [phone]  03/13/2017 12:05 PM

## 2017-03-14 LAB — SEDIMENTATION RATE: Sedimentation Rate-Westergren: 30 mm/hr (ref 0–32)

## 2017-03-14 LAB — VITAMIN B12: VITAMIN B 12: 388 pg/mL (ref 232–1245)

## 2017-03-14 LAB — C-REACTIVE PROTEIN: CRP: 34.3 mg/L — ABNORMAL HIGH (ref 0.0–4.9)

## 2017-03-16 LAB — FOLATE RBC
Folate, Hemolysate: 260.3 ng/mL
Folate, RBC: 796 ng/mL (ref >498)
Hematocrit: 32.7 % — ABNORMAL LOW (ref 34.0–46.6)

## 2017-03-27 ENCOUNTER — Ambulatory Visit (HOSPITAL_BASED_OUTPATIENT_CLINIC_OR_DEPARTMENT_OTHER): Payer: BLUE CROSS/BLUE SHIELD | Admitting: Hematology

## 2017-03-27 ENCOUNTER — Telehealth: Payer: Self-pay

## 2017-03-27 ENCOUNTER — Encounter: Payer: Self-pay | Admitting: Hematology

## 2017-03-27 VITALS — BP 124/72 | HR 94 | Temp 98.5°F | Resp 20 | Ht 64.5 in | Wt 215.1 lb

## 2017-03-27 DIAGNOSIS — D509 Iron deficiency anemia, unspecified: Secondary | ICD-10-CM | POA: Diagnosis not present

## 2017-03-27 DIAGNOSIS — E538 Deficiency of other specified B group vitamins: Secondary | ICD-10-CM

## 2017-03-27 DIAGNOSIS — D473 Essential (hemorrhagic) thrombocythemia: Secondary | ICD-10-CM | POA: Diagnosis not present

## 2017-03-27 DIAGNOSIS — D508 Other iron deficiency anemias: Secondary | ICD-10-CM

## 2017-03-27 DIAGNOSIS — D75839 Thrombocytosis, unspecified: Secondary | ICD-10-CM

## 2017-03-27 DIAGNOSIS — D72829 Elevated white blood cell count, unspecified: Secondary | ICD-10-CM

## 2017-03-27 NOTE — Telephone Encounter (Signed)
Message sent to scheduling to confirm LOS from Dr. Irene Limbo. Two IV iron transfusions 1 week apart, and f/u in two months with labs and doctor visit.

## 2017-03-27 NOTE — Progress Notes (Signed)
Pt uses [facility] for 90-day supplies. [facility] for all other medications. Based on insurance requirements.

## 2017-03-27 NOTE — Patient Instructions (Signed)
Thank you for choosing [facility] to provide your oncology and hematology care.  To afford each patient quality time with our providers, please arrive 30 minutes before your scheduled appointment time.  If you arrive late for your appointment, you may be asked to reschedule.  We strive to give you quality time with our providers, and arriving late affects you and other patients whose appointments are after yours.  If you are a no show for multiple scheduled visits, you may be dismissed from the clinic at the providers discretion.   Again, thank you for choosing [facility], our hope is that these requests will decrease the amount of time that you wait before being seen by our physicians.  ______________________________________________________________________ Should you have questions after your visit to the [facility], please contact our office at [phone] between the hours of 8:30 and 4:30 p.m.    Voicemails left after 4:30p.m will not be returned until the following business day.   For prescription refill requests, please have your pharmacy contact us directly.  Please also try to allow 48 hours for prescription requests.   Please contact the scheduling department for questions regarding scheduling.  For scheduling of procedures such as PET scans, CT scans, MRI, Ultrasound, etc please contact central scheduling at [phone].   Resources For Cancer Patients and Caregivers:  American Cancer Society:  800-227-2345  Can help patients locate various types of support and financial assistance Cancer Care: 1-800-813-HOPE (4673) Provides financial assistance, online support groups, medication/co-pay assistance.   [place] DSS:  336-641-3447 Where to apply for food stamps, Medicaid, and utility assistance Medicare Rights Center: 800-333-4114 Helps people with Medicare understand their rights and benefits, navigate the Medicare system, and secure the  quality healthcare they deserve SCAT: 336-333-6589 [employer]'s shared-ride transportation service for eligible riders who have a disability that prevents them from riding the fixed route bus.   For additional information on assistance programs please contact our social worker:   Grier Hock/Abigail Elmore:  [phone] 

## 2017-03-29 NOTE — Progress Notes (Signed)
[name]    HEMATOLOGY/ONCOLOGY CLINIC NOTE  Date of Service:  .03/27/2017   Patient Care Team: Binnie Rail, MD as PCP - General (Internal Medicine) Servando Salina, MD as Consulting Physician (Obstetrics and Gynecology) Harold Hedge, Darrick Grinder, MD (Allergy and Immunology) Philemon Kingdom, MD (Endocrinology) Dennard Nip, NP as Nurse Practitioner (Psychiatry) Calvert Cantor, MD (Ophthalmology) Pieter Partridge, DO (Neurology)  CHIEF COMPLAINTS/PURPOSE OF CONSULTATION:  Anemia and thrombocytosis  HISTORY OF PRESENTING ILLNESS:   Lisa Duran is a wonderful 36 y.o. female who has been referred to [place] by Dr .Quay Burow, Claudina Lick, MD for evaluation and management of evaluation of Anemia and thrombocytosis.  Patient has h/o fibromyalga, significant eczema, possible IBS (self decided to pursue gluten free diet), DM2, ADHD, anxiety, obesity had labs with her PCP On 02/03/2017 and was noted to have anemia with a hgb of 10.8 with MCV of 66.3 with elevated platelets of 573k with borderline leucocytosis of 12.7 with  Eosinophilia of 1.3k.  Hgb 1  Yr ago was 11.6 with MCV of 74.6 and platelets of 538k  She has had significantly elevated inflammatory markers (sed rate 112) and has been in the process of having a rheumatology w/u with Gavin Pound MD.  No fatigue but no evident LNadenopathy, splenomegaly . No fevers/chills. Notes that she has had a Mirena IUD and notes no significant menstrual losses. No other overt bleeding. Has been on chronic PPI therapy. No overt weight loss or other focal symptoms suggestive of malignancy. Patient has previosuly had EGD in 09/2015 that showed mild pan gastritis and HH without cameron ulcers.  INTERVAL HISTORY  Patient is here for f/u for her anemia and to review her labs results. She was noted to have significant iron deficiency with ferritin level of 15 which would be especially low in the setting of chronic inflammation.     MEDICAL HISTORY:  Past Medical  History:  Diagnosis Date  . ADHD (attention deficit hyperactivity disorder)   . ALLERGIC RHINITIS   . Allergy   . Anemia   . ANXIETY   . Depression   . DIABETES MELLITUS, TYPE II   . Fibromyalgia 2018  . GERD (gastroesophageal reflux disease)   . MIGRAINE, COMMON   . Obesity, unspecified   . OVARIAN CYST     SURGICAL HISTORY: Past Surgical History:  Procedure Laterality Date  . NO PAST SURGERIES      SOCIAL HISTORY: Social History   Social History  . Marital status: Married    Spouse name: N/A  . Number of children: N/A  . Years of education: N/A   Occupational History  . Not on file.   Social History Main Topics  . Smoking status: Former Smoker    Quit date: 08/20/2002  . Smokeless tobacco: Never Used     Comment: single, separated from spouse 02/2010.   [name] Alcohol use Yes     Comment: rarely  . Drug use: No  . Sexual activity: Not Currently    Birth control/ protection: IUD   Other Topics Concern  . Not on file   Social History Narrative   Single, separated from spouse 02/2010    FAMILY HISTORY: Family History  Problem Relation Age of Onset  . Diabetes Mother   . Hyperlipidemia Mother   . Diabetes Father   . Hyperlipidemia Father   . Coronary artery disease Father   . Hypertension Father   . Cancer Father        esoph  . Stroke Father   .  Cancer Maternal Grandfather 66       Prostate and Lung  . Diabetes Sister        Gestational  . Berenice Primas' disease Maternal Aunt   . Cancer Maternal Uncle        Brain  . Stroke Paternal Grandmother   . Heart disease Paternal Grandmother   . Stroke Paternal Grandfather   . Heart disease Paternal Grandfather   . Diabetes Sister        Gestational and Type II  . Hashimoto's thyroiditis Cousin   . Cancer Maternal Uncle        Brain    ALLERGIES:  is allergic to propofol; chicken allergy; gluten meal; other; and victoza [liraglutide].  MEDICATIONS:  Current Outpatient Prescriptions  Medication Sig Dispense  Refill  . almotriptan (AXERT) 12.5 MG tablet Take 1 tablet (12.5 mg total) by mouth as needed for migraine. may repeat in 2 hours if needed 12 tablet 3  . ALPRAZolam (XANAX) 0.5 MG tablet TAKE 1 TABLET BY MOUTH THREE TIMES DAILY AS NEEDED FOR ANXIETY 30 tablet 0  . amphetamine-dextroamphetamine (ADDERALL XR) 20 MG 24 hr capsule Take 1 capsule (20 mg total) by mouth daily. 30 capsule 0  . BD PEN NEEDLE NANO U/F 32G X 4 MM MISC USE ONCE A DAY 90 each 3  . citalopram (CELEXA) 20 MG tablet TAKE 1 TABLET BY MOUTH  DAILY 90 tablet 1  . fluticasone (FLONASE) 50 MCG/ACT nasal spray Place 2 sprays into both nostrils daily.   1  . glipiZIDE (GLUCOTROL XL) 5 MG 24 hr tablet Take 1 tablet (5 mg total) by mouth daily with breakfast. 90 tablet 3  . glucose blood (ONETOUCH VERIO) test strip Use 2x a day 100 each 12  . Insulin Glargine (LANTUS SOLOSTAR) 100 UNIT/ML Solostar Pen Inject 32 Units into the skin at bedtime. 30 mL 1  . Insulin Pen Needle 32G X 4 MM MISC Use 1x a day 100 each 3  . JANUVIA 100 MG tablet TAKE 1 TABLET BY MOUTH  DAILY 90 tablet 1  . lamoTRIgine (LAMICTAL) 100 MG tablet TAKE 1 TABLET BY MOUTH TWO  TIMES DAILY 180 tablet 1  . levonorgestrel (MIRENA) 20 MCG/24HR IUD 1 Intra Uterine Device (1 each total) by Intrauterine route once. 1 each 0  . metFORMIN (GLUCOPHAGE) 1000 MG tablet TAKE 1/2 TABLET BY MOUTH  TWICE A DAY WITH A MEAL 90 tablet 1  . metoprolol succinate (TOPROL-XL) 25 MG 24 hr tablet Take 0.5 tablets (12.5 mg total) by mouth daily. 45 tablet 1  . omeprazole (PRILOSEC) 40 MG capsule TAKE 1 CAPSULE BY MOUTH  DAILY BEFORE SUPPER. 90 capsule 3  . Prenatal Vit-Fe Fumarate-FA (MULTIVITAMIN-PRENATAL) 27-0.8 MG TABS tablet Take 1 tablet by mouth daily at 12 noon.    . promethazine (PHENERGAN) 25 MG tablet Take 25 mg by mouth every 6 (six) hours as needed for nausea.    . traMADol-acetaminophen (ULTRACET) 37.5-325 MG per tablet Take 1 tablet by mouth every 6 (six) hours as needed for pain.     [name] triamcinolone cream (KENALOG) 0.1 % Apply topically 2 (two) times daily as needed. 30 g 1   No current facility-administered medications for this visit.     REVIEW OF SYSTEMS:    10 Point review of Systems was done is negative except as noted above.  PHYSICAL EXAMINATION: ECOG PERFORMANCE STATUS: 1 - Symptomatic but completely ambulatory  . Vitals:   03/27/17 1018  BP: 124/72  Pulse: 94  Resp: 20  Temp: 98.5 F (36.9 C)  SpO2: 99%   Filed Weights   03/27/17 1018  Weight: 215 lb 1.6 oz (97.6 kg)   .Body mass index is 36.35 kg/m.  GENERAL:alert, in no acute distress and comfortable SKIN: no acute rashes, no significant lesions EYES: conjunctiva are pink and non-injected, sclera anicteric OROPHARYNX: MMM, no exudates, no oropharyngeal erythema or ulceration NECK: supple, no JVD LYMPH:  no palpable lymphadenopathy in the cervical, axillary or inguinal regions LUNGS: clear to auscultation b/l with normal respiratory effort HEART: regular rate & rhythm ABDOMEN:  normoactive bowel sounds , non tender, not distended. No palpable hepatosplenomegaly. Extremity: no pedal edema PSYCH: alert & oriented x 3 with fluent speech NEURO: no focal motor/sensory deficits  LABORATORY DATA:  I have reviewed the data as listed  . CBC Latest Ref Rng & Units 03/13/2017 03/13/2017 02/03/2017  WBC 3.9 - 10.3 10e3/uL 9.3 - 12.7(H)  Hemoglobin 11.6 - 15.9 g/dL 10.3(L) - 10.8(L)  Hematocrit 34.0 - 46.6 % 33.9(L) 32.7(L) 34.8(L)  Platelets 145 - 400 10e3/uL 447(H) - 573.0(H)   . CBC    Component Value Date/Time   WBC 9.3 03/13/2017 1304   WBC 12.7 (H) 02/03/2017 1621   RBC 4.96 03/13/2017 1304   RBC 5.25 (H) 02/03/2017 1621   HGB 10.3 (L) 03/13/2017 1304   HCT 33.9 (L) 03/13/2017 1304   HCT 32.7 (L) 03/13/2017 1304   PLT 447 (H) 03/13/2017 1304   MCV 68.3 (L) 03/13/2017 1304   MCH 20.8 (L) 03/13/2017 1304   MCH 22.8 (L) 07/19/2014 1905   MCHC 30.4 (L) 03/13/2017 1304   MCHC 31.0  02/03/2017 1621   RDW 18.6 (H) 03/13/2017 1304   LYMPHSABS 2.1 03/13/2017 1304   MONOABS 0.7 03/13/2017 1304   EOSABS 0.4 03/13/2017 1304   BASOSABS 0.1 03/13/2017 1304     . CMP Latest Ref Rng & Units 03/13/2017 02/03/2017 12/20/2015  Glucose 70 - 140 mg/dl 89 92 229(H)  BUN 7.0 - 26.0 mg/dL 9.0 10 9  Creatinine 0.6 - 1.1 mg/dL 0.8 0.73 0.77  Sodium 136 - 145 mEq/L 138 138 136  Potassium 3.5 - 5.1 mEq/L 4.1 4.0 4.2  Chloride 96 - 112 mEq/L - 103 100  CO2 22 - 29 mEq/L 26 27 27   Calcium 8.4 - 10.4 mg/dL 9.4 9.7 9.4  Total Protein 6.4 - 8.3 g/dL 7.6 7.7 7.8  Total Bilirubin 0.20 - 1.20 mg/dL 0.26 0.2 0.3  Alkaline Phos 40 - 150 U/L 140 134(H) 122(H)  AST 5 - 34 U/L 16 12 21   ALT 0 - 55 U/L 20 12 28    Component     Latest Ref Rng & Units 03/13/2017  Iron     41 - 142 ug/dL 16 (L)  TIBC     236 - 444 ug/dL 383  UIBC     120 - 384 ug/dL 367  %SAT     21 - 57 % 4 (L)  Folate, Hemolysate     Not Estab. ng/mL 260.3  HCT     34.0 - 46.6 % 32.7 (L)  Folate, RBC     >498 ng/mL 796  Sed Rate     0 - 32 mm/hr 30  CRP     0.0 - 4.9 mg/L 34.3 (H)  LDH     125 - 245 U/L 144  Ferritin     9 - 269 ng/ml 15  Vitamin B12     232 - 1,245 pg/mL 388  RADIOGRAPHIC STUDIES: I have personally reviewed the radiological images as listed and agreed with the findings in the report. No results found.  ASSESSMENT & PLAN:   36 yo with    1) Chronic worsening Microcytic Anemia  Noted to have Iron deficiency with ferritin level of 15 with 4% iron saturation. Sed rate better with significantly elevated CRP levels - still suggest significant inflammation.   2) Chronic Thrombocytosis - likely reactive from Iron deficiency and inflammation. Given patients age and non progressive nature of her plt counts- much less likely to be a clonal MPN such as Essential thrombocytosis.  3) Mild leucocytosis - reacitve -- obesity, inflammation, Eczema, Adderall etc.  4) Eosinophilia - likely from  allergies/Eczema -- now resolved. Plan  - labs were reviewed in details with patient and all her questions were answered to her apparent satisfaction. -given her significant fatigue and chronic inflammation would plan to target a ferritin level of >100. -she notes that she does not tolerate po iron well, also is on chronic PPI therapy -- so she was offered option for IV Iron. -after discussion of risk vs benefits and alternatives informed consent was obtained for IV Injectafer. -IV Injectafer 750mg  IV weekly x 2 doses. -given her B12 levels were low normal she was recommended to take OTC B12 1031mcg daily and B complex to support accelerated erythropoiesis and since her chronic metformin use is a risk factor for B12 deficiency as well. -f/u with PCP to consider completion of GI workup for evaluation of Iron deficiency. -f/uw ith rheumatology for evaluation and management of possible rheumatological condition.  IV Injectafer 750mg  weekly x 2 doses RTC with Dr Irene Limbo in 2 months with labs  Orders Placed This Encounter  Procedures  . CBC & Diff and Retic    Standing Status:   Future    Standing Expiration Date:   03/27/2018  . Comprehensive metabolic panel    Standing Status:   Future    Standing Expiration Date:   03/27/2018  . Ferritin    Standing Status:   Future    Standing Expiration Date:   03/27/2018  . Iron and TIBC    Standing Status:   Future    Standing Expiration Date:   03/27/2018    All of the patients questions were answered with apparent satisfaction. The patient knows to call the clinic with any problems, questions or concerns.  I spent 20 minutes counseling the patient face to face. The total time spent in the appointment was 25 minutes and more than 50% was on counseling and direct patient cares.    Sullivan Lone MD [place] AAHIVMS [facility] Hematology/Oncology Physician [facility]  (Office):       [phone] (Work cell):  [phone] (Fax):            [phone]  03/29/2017 6:19 PM

## 2017-04-01 ENCOUNTER — Encounter: Payer: Self-pay | Admitting: Internal Medicine

## 2017-04-01 MED ORDER — ALPRAZOLAM 0.5 MG PO TABS: 0.5000 mg | ORAL_TABLET | Freq: Three times a day (TID) | ORAL | 0 refills | Status: DC | PRN

## 2017-04-01 MED ORDER — AMPHETAMINE-DEXTROAMPHET ER 20 MG PO CP24: 20.0000 mg | ORAL_CAPSULE | Freq: Every day | ORAL | 0 refills | Status: DC

## 2017-04-01 NOTE — Telephone Encounter (Signed)
[place] controlled substance database checked.  Ok to fill medication. printed 

## 2017-04-06 ENCOUNTER — Ambulatory Visit (INDEPENDENT_AMBULATORY_CARE_PROVIDER_SITE_OTHER): Payer: BLUE CROSS/BLUE SHIELD | Admitting: Neurology

## 2017-04-06 ENCOUNTER — Encounter: Payer: Self-pay | Admitting: Neurology

## 2017-04-06 VITALS — BP 124/83 | HR 80 | Ht 64.0 in | Wt 219.0 lb

## 2017-04-06 DIAGNOSIS — F513 Sleepwalking [somnambulism]: Secondary | ICD-10-CM | POA: Insufficient documentation

## 2017-04-06 DIAGNOSIS — E0840 Diabetes mellitus due to underlying condition with diabetic neuropathy, unspecified: Secondary | ICD-10-CM | POA: Diagnosis not present

## 2017-04-06 DIAGNOSIS — G4733 Obstructive sleep apnea (adult) (pediatric): Secondary | ICD-10-CM

## 2017-04-06 DIAGNOSIS — R51 Headache: Secondary | ICD-10-CM | POA: Diagnosis not present

## 2017-04-06 DIAGNOSIS — R519 Headache, unspecified: Secondary | ICD-10-CM

## 2017-04-06 NOTE — Progress Notes (Signed)
SLEEP MEDICINE CLINIC   Provider:  Larey Seat, [place] D  Primary Care Physician:  Binnie Rail, MD   Referring Provider: Binnie Rail, MD    Chief Complaint  Patient presents with  . New Patient (Initial Visit)    do not snore, has done an HST in the past    HPI:  Lisa Duran is a 36 y.o. female patient, seen here as in a referral  from Dr. Quay Burow for a sleep evaluation, based on her chief complaint of excessive daytime sleepiness in the setting of whole body ache. She has taken a HST and scored an AHI of  8, which is elevated.   Lisa Duran works for [employer], is well aware of the risk factors for sleep apnea, the signs and symptoms. She does not snore. She has whole body aches she feels a chronic and persistent exhaustion, she sleeps from 10 PM to 7 AM but does not feel refreshed when she wakes up. She has also been evaluated for multifocal pain and muscular and tendon pain without any joint swelling. She feels that after exercise she is more sore but she continues with yoga 2-3 times a week. She has recently been evaluated by a rheumatologist. She carries the diagnoses of diabetes mellitus type 2, migraine without aura, overweight, ADHD, with anxiety. Tachycardia, GERD, and I reviewed the medications used currently which include Lantus insulin. She's also on Lamictal and Januvia, her diabetes is followed by Dr. Christianne Dolin.   Sleep habits are as follows: The patient's bedtime is usually around 10 PM, and she does  have 2 days a week trouble initiating sleep at night. For the last our leading up to her sleep time she stays away from screen lites, does not watch TV. She may repeat or walk her dog. She keeps a good sleep hygiene, her bedroom is cool quiet and dark and she currently sleeps alone. She sleeps with 2 pillows. She may stay asleep for a period of one hour and then wakes up. She is not sure about the triggers for these arousals. There is physical discomfort that  maybe to this, hip pain, knee pain chest pain and soreness. She usually has one nocturia at night, patient estimates that she gets 8 hours of sleep at night that she struggles to get up at 7 in the morning. She feels neither refreshed nor restored and actually would like to stay in bed. She reports vivid dreams further fragmenting her sleep at times. Sometimes waking up with palpitations or diaphoresis.     Sleep medical history and family sleep history: the patient is a sleep walker, has been in childhood. Always had vivid dreams. Dreams with sleep talking , Father had a stroke and MI at 25, died of oesophageal cancer at age 4. He was a loud snorer. No tonsillectomy , no TBI, Diabetes diagnosed at age 74.    Social history: single , recently separated, one dog and 2 cats, 2 birds.  Review of Systems: Out of a complete 14 system review, the patient complains of only the following symptoms, and all other reviewed systems are negative.   Epworth score  11 , Fatigue severity score 58  , depression score n/a , vivid dreams, no sleep paralysis, no cataplexy.    Social History   Social History  . Marital status: Married    Spouse name: N/A  . Number of children: N/A  . Years of education: N/A   Occupational History  .  Not on file.   Social History Main Topics  . Smoking status: Former Smoker    Quit date: 08/20/2002  . Smokeless tobacco: Never Used     Comment: single, separated from spouse 02/2010.   [name] Alcohol use Yes     Comment: rarely  . Drug use: No  . Sexual activity: Not Currently    Birth control/ protection: IUD   Other Topics Concern  . Not on file   Social History Narrative   Single, separated from spouse 02/2010    Family History  Problem Relation Age of Onset  . Diabetes Mother   . Hyperlipidemia Mother   . Diabetes Father   . Hyperlipidemia Father   . Coronary artery disease Father   . Hypertension Father   . Cancer Father        esoph  . Stroke Father   .  Cancer Maternal Grandfather 65       Prostate and Lung  . Diabetes Sister        Gestational  . Berenice Primas' disease Maternal Aunt   . Cancer Maternal Uncle        Brain  . Stroke Paternal Grandmother   . Heart disease Paternal Grandmother   . Stroke Paternal Grandfather   . Heart disease Paternal Grandfather   . Diabetes Sister        Gestational and Type II  . Hashimoto's thyroiditis Cousin   . Cancer Maternal Uncle        Brain    Past Medical History:  Diagnosis Date  . ADHD (attention deficit hyperactivity disorder)   . ALLERGIC RHINITIS   . Allergy   . Anemia   . ANXIETY   . Depression   . DIABETES MELLITUS, TYPE II   . Fibromyalgia 2018  . GERD (gastroesophageal reflux disease)   . MIGRAINE, COMMON   . Obesity, unspecified   . OVARIAN CYST     Past Surgical History:  Procedure Laterality Date  . NO PAST SURGERIES      Current Outpatient Prescriptions  Medication Sig Dispense Refill  . almotriptan (AXERT) 12.5 MG tablet Take 1 tablet (12.5 mg total) by mouth as needed for migraine. may repeat in 2 hours if needed 12 tablet 3  . ALPRAZolam (XANAX) 0.5 MG tablet Take 1 tablet (0.5 mg total) by mouth 3 (three) times daily as needed. for anxiety 30 tablet 0  . amphetamine-dextroamphetamine (ADDERALL XR) 20 MG 24 hr capsule Take 1 capsule (20 mg total) by mouth daily. 30 capsule 0  . BD PEN NEEDLE NANO U/F 32G X 4 MM MISC USE ONCE A DAY 90 each 3  . citalopram (CELEXA) 20 MG tablet TAKE 1 TABLET BY MOUTH  DAILY 90 tablet 1  . fluticasone (FLONASE) 50 MCG/ACT nasal spray Place 2 sprays into both nostrils daily.   1  . glipiZIDE (GLUCOTROL XL) 5 MG 24 hr tablet Take 1 tablet (5 mg total) by mouth daily with breakfast. 90 tablet 3  . glucose blood (ONETOUCH VERIO) test strip Use 2x a day 100 each 12  . Insulin Glargine (LANTUS SOLOSTAR) 100 UNIT/ML Solostar Pen Inject 32 Units into the skin at bedtime. 30 mL 1  . Insulin Pen Needle 32G X 4 MM MISC Use 1x a day 100 each 3    . JANUVIA 100 MG tablet TAKE 1 TABLET BY MOUTH  DAILY 90 tablet 1  . lamoTRIgine (LAMICTAL) 100 MG tablet TAKE 1 TABLET BY MOUTH TWO  TIMES DAILY 180 tablet  1  . levonorgestrel (MIRENA) 20 MCG/24HR IUD 1 Intra Uterine Device (1 each total) by Intrauterine route once. 1 each 0  . metFORMIN (GLUCOPHAGE) 1000 MG tablet TAKE 1/2 TABLET BY MOUTH  TWICE A DAY WITH A MEAL 90 tablet 1  . metoprolol succinate (TOPROL-XL) 25 MG 24 hr tablet Take 0.5 tablets (12.5 mg total) by mouth daily. 45 tablet 1  . omeprazole (PRILOSEC) 40 MG capsule TAKE 1 CAPSULE BY MOUTH  DAILY BEFORE SUPPER. 90 capsule 3  . Prenatal Vit-Fe Fumarate-FA (MULTIVITAMIN-PRENATAL) 27-0.8 MG TABS tablet Take 1 tablet by mouth daily at 12 noon.    . promethazine (PHENERGAN) 25 MG tablet Take 25 mg by mouth every 6 (six) hours as needed for nausea.    . traMADol-acetaminophen (ULTRACET) 37.5-325 MG per tablet Take 1 tablet by mouth every 6 (six) hours as needed for pain.    [name] triamcinolone cream (KENALOG) 0.1 % Apply topically 2 (two) times daily as needed. 30 g 1   No current facility-administered medications for this visit.     Allergies as of 04/06/2017 - Review Complete 04/06/2017  Allergen Reaction Noted  . Propofol Shortness Of Breath 09/18/2015  . Chicken allergy  01/16/2013  . Gluten meal Diarrhea 01/19/2015  . Other  01/16/2013  . Victoza [liraglutide] Rash 06/20/2014    Vitals: BP 124/83   Pulse 80   Ht 5\' 4"  (1.626 m)   Wt 219 lb (99.3 kg)   BMI 37.59 kg/m  Last Weight:  Wt Readings from Last 1 Encounters:  04/06/17 219 lb (99.3 kg)   EXB:MWUX mass index is 37.59 kg/m.     Last Height:   Ht Readings from Last 1 Encounters:  04/06/17 5\' 4"  (1.626 m)    Physical exam:  General: The patient is awake, alert and appears not in acute distress. The patient is well groomed. Head: Normocephalic, atraumatic. Neck is supple. Mallampati 3  neck circumference:15. Nasal airflow patent , TMJ pain on the right, not  ACTIVE . Retrognathia is not seen. Has never worn braces or retainers.  Cardiovascular:  Regular rate and rhythm, without  murmurs or carotid bruit, and without distended neck veins. Respiratory: Lungs are clear to auscultation. Skin:  Without evidence of edema, or rash Trunk: BMI is 38. The patient's posture is erect.   Neurologic exam : The patient is awake and alert, oriented to place and time.   Attention span & concentration ability appears normal.  Speech is fluent,  without dysarthria, dysphonia or aphasia.  Mood and affect are appropriate.  Cranial nerves: Pupils are equal and briskly reactive to light. Funduscopic exam without evidence of pallor or edema.  Extraocular movements  in vertical and horizontal planes intact and without nystagmus. Visual fields by finger perimetry are intact. Hearing to finger rub intact. Facial sensation intact to fine touch. Facial motor strength is symmetric and tongue and uvula move midline. Shoulder shrug was symmetrical.   Motor exam:  Normal tone, muscle bulk and symmetric strength in all extremities. Sensory:  Fine touch, pinprick and vibration - stinging pain in feet. Proprioception tested in the upper extremities was normal. Coordination:  Finger-to-nose maneuver  normal without evidence of ataxia, dysmetria or tremor. Gait and station: Patient walks without assistive device .Tandem gait is unfragmented.  Turns with 3 Steps. Deep tendon reflexes: in the  upper and lower extremities are symmetric and intact.    Assessment:  After physical and neurologic examination, review of laboratory studies,  Personal review of imaging studies,  reports of other /same  Imaging studies, results of polysomnography and / or neurophysiology testing and pre-existing records as far as provided in visit., my assessment is   1)  Mrs. Hayton has a diagnosis of ADHD and has been treated with Adderall, she's using an extended release form. Reaming was not successful and  a switch to Strattera also didn't help. I think it may contribute to some degree to her fatigue that she has been on a constant adrenal pumping medication. May consider changing to Vyvanse to have less anxiety, tachycardia and diaphoresis associated.  2) the patient was surprised to find her home sleep test indicated that she may have mild sleep apnea, but she has gained weight since being treated with insulin also she is considered a diabetes type 2 she baby a diabetes type 1 .5 with earlier insulin need. This group of young diabetics is more prone to chronic fatigue and weight gain.  3) She does not endorse narcoleptic symptoms aside from vivid dreams, and she has never experienced cataplexy after I explained what those symptoms may be. I would very much prefer to have this patient evaluated and attended sleep study. A home sleep test will not differentiate why she has arousals, and leave it up to speculation is apnea is the cause. There could be periodic limb movements she is not aware of, irregular heart rates, and since she is also a migraine or it is very important to measure capnography and look for hypoxemia, as both gas exchange problems promote headaches in the morning.   The patient was advised of the nature of the diagnosed disorder , the treatment options and the  risks for general health and wellness arising from not treating the condition.   I spent more than 35 minutes of face to face time with the patient.  Greater than 50% of time was spent in counseling and coordination of care. We have discussed the diagnosis and differential and I answered the patient's questions.    Plan:  Treatment plan and additional workup :  SPLIT night PSG, CO2 , arousals to be differentiated. I need the patient's hypopneas to be classified into obstructive and centrals.   Larey Seat, MD 3/54/5625, 6:38 AM  Certified in Neurology by ABPN Certified in [place] by [facility] [address] [place], [place] [address]

## 2017-04-06 NOTE — Patient Instructions (Signed)
Please remember to try to maintain good sleep hygiene, which means: Keep a regular sleep and wake schedule, try not to exercise or have a meal within 2 hours of your bedtime, try to keep your bedroom conducive for sleep, that is, cool and dark, without light distractors such as an illuminated alarm clock, and refrain from watching TV right before sleep or in the middle of the night and do not keep the TV or radio on during the night. Also, try not to use or play on electronic devices at bedtime, such as your cell phone, tablet PC or laptop. If you like to read at bedtime on an electronic device, try to dim the background light as much as possible. Do not eat in the middle of the night.   We will request a sleep study.    We will look for leg twitching and snoring or sleep apnea.   For chronic insomnia and hypersomnia , you are best followed by a psychiatrist and/or sleep psychologist.   We will call you with the sleep study results and make a follow up appointment if needed.

## 2017-04-07 ENCOUNTER — Ambulatory Visit (HOSPITAL_BASED_OUTPATIENT_CLINIC_OR_DEPARTMENT_OTHER): Payer: BLUE CROSS/BLUE SHIELD

## 2017-04-07 ENCOUNTER — Ambulatory Visit: Payer: Self-pay | Admitting: Internal Medicine

## 2017-04-07 ENCOUNTER — Other Ambulatory Visit: Payer: Self-pay | Admitting: *Deleted

## 2017-04-07 VITALS — BP 108/83 | HR 84 | Temp 98.0°F | Resp 16

## 2017-04-07 DIAGNOSIS — D509 Iron deficiency anemia, unspecified: Secondary | ICD-10-CM

## 2017-04-07 DIAGNOSIS — D508 Other iron deficiency anemias: Secondary | ICD-10-CM

## 2017-04-07 MED ORDER — FAMOTIDINE 20 MG PO TABS
ORAL_TABLET | ORAL | Status: AC
  Filled 2017-04-07: qty 1

## 2017-04-07 MED ORDER — FAMOTIDINE 20 MG PO TABS
20.0000 mg | ORAL_TABLET | Freq: Once | ORAL | Status: AC
  Administered 2017-04-07: 20 mg via ORAL

## 2017-04-07 MED ORDER — DIPHENHYDRAMINE HCL 25 MG PO TABS
25.0000 mg | ORAL_TABLET | Freq: Once | ORAL | Status: AC
  Administered 2017-04-07: 25 mg via ORAL
  Filled 2017-04-07: qty 1

## 2017-04-07 MED ORDER — ACETAMINOPHEN 325 MG PO TABS
650.0000 mg | ORAL_TABLET | Freq: Once | ORAL | Status: AC
  Administered 2017-04-07: 650 mg via ORAL

## 2017-04-07 MED ORDER — FERRIC CARBOXYMALTOSE 750 MG/15ML IV SOLN
750.0000 mg | Freq: Once | INTRAVENOUS | Status: AC
  Administered 2017-04-07: 750 mg via INTRAVENOUS
  Filled 2017-04-07: qty 15

## 2017-04-07 MED ORDER — ACETAMINOPHEN 325 MG PO TABS
ORAL_TABLET | ORAL | Status: AC
  Filled 2017-04-07: qty 2

## 2017-04-07 MED ORDER — DIPHENHYDRAMINE HCL 25 MG PO CAPS
ORAL_CAPSULE | ORAL | Status: AC
  Filled 2017-04-07: qty 1

## 2017-04-07 NOTE — Patient Instructions (Signed)

## 2017-04-16 ENCOUNTER — Ambulatory Visit (HOSPITAL_BASED_OUTPATIENT_CLINIC_OR_DEPARTMENT_OTHER): Payer: BLUE CROSS/BLUE SHIELD

## 2017-04-16 VITALS — BP 112/77 | HR 79 | Temp 97.6°F | Resp 16

## 2017-04-16 DIAGNOSIS — D508 Other iron deficiency anemias: Secondary | ICD-10-CM

## 2017-04-16 DIAGNOSIS — D509 Iron deficiency anemia, unspecified: Secondary | ICD-10-CM

## 2017-04-16 MED ORDER — FAMOTIDINE 20 MG PO TABS
20.0000 mg | ORAL_TABLET | Freq: Once | ORAL | Status: AC
  Administered 2017-04-16: 20 mg via ORAL

## 2017-04-16 MED ORDER — FAMOTIDINE 20 MG PO TABS
ORAL_TABLET | ORAL | Status: AC
  Filled 2017-04-16: qty 1

## 2017-04-16 MED ORDER — FAMOTIDINE IN NACL 20-0.9 MG/50ML-% IV SOLN
INTRAVENOUS | Status: AC
  Filled 2017-04-16: qty 50

## 2017-04-16 MED ORDER — DIPHENHYDRAMINE HCL 25 MG PO TABS
25.0000 mg | ORAL_TABLET | Freq: Once | ORAL | Status: AC
  Administered 2017-04-16: 25 mg via ORAL
  Filled 2017-04-16: qty 1

## 2017-04-16 MED ORDER — FERRIC CARBOXYMALTOSE 750 MG/15ML IV SOLN
750.0000 mg | Freq: Once | INTRAVENOUS | Status: AC
  Administered 2017-04-16: 750 mg via INTRAVENOUS
  Filled 2017-04-16: qty 15

## 2017-04-16 MED ORDER — ACETAMINOPHEN 325 MG PO TABS
650.0000 mg | ORAL_TABLET | Freq: Once | ORAL | Status: AC
  Administered 2017-04-16: 650 mg via ORAL

## 2017-04-16 MED ORDER — ACETAMINOPHEN 325 MG PO TABS
ORAL_TABLET | ORAL | Status: AC
  Filled 2017-04-16: qty 2

## 2017-04-16 MED ORDER — DIPHENHYDRAMINE HCL 25 MG PO CAPS
ORAL_CAPSULE | ORAL | Status: AC
  Filled 2017-04-16: qty 1

## 2017-04-16 MED ORDER — SODIUM CHLORIDE 0.9 % IV SOLN
INTRAVENOUS | Status: DC
  Administered 2017-04-16: 09:00:00 via INTRAVENOUS

## 2017-04-16 NOTE — Patient Instructions (Signed)
Ferric carboxymaltose injection What is this medicine? FERRIC CARBOXYMALTOSE (ferr-ik car-box-ee-mol-toes) is an iron complex. Iron is used to make healthy red blood cells, which carry oxygen and nutrients throughout the body. This medicine is used to treat anemia in people with chronic kidney disease or people who cannot take iron by mouth. This medicine may be used for other purposes; ask your health care provider or pharmacist if you have questions. COMMON BRAND NAME(S): Injectafer What should I tell my health care provider before I take this medicine? They need to know if you have any of these conditions: -anemia not caused by low iron levels -high levels of iron in the blood -liver disease -an unusual or allergic reaction to iron, other medicines, foods, dyes, or preservatives -pregnant or trying to get pregnant -breast-feeding How should I use this medicine? This medicine is for infusion into a vein. It is given by a health care professional in a hospital or clinic setting. Talk to your pediatrician regarding the use of this medicine in children. Special care may be needed. Overdosage: If you think you have taken too much of this medicine contact a poison control center or emergency room at once. NOTE: This medicine is only for you. Do not share this medicine with others. What if I miss a dose? It is important not to miss your dose. Call your doctor or health care professional if you are unable to keep an appointment. What may interact with this medicine? Do not take this medicine with any of the following medications: -deferoxamine -dimercaprol -other iron products This medicine may also interact with the following medications: -chloramphenicol -deferasirox This list may not describe all possible interactions. Give your health care provider a list of all the medicines, herbs, non-prescription drugs, or dietary supplements you use. Also tell them if you smoke, drink alcohol, or use  illegal drugs. Some items may interact with your medicine. What should I watch for while using this medicine? Visit your doctor or health care professional regularly. Tell your doctor if your symptoms do not start to get better or if they get worse. You may need blood work done while you are taking this medicine. You may need to follow a special diet. Talk to your doctor. Foods that contain iron include: whole grains/cereals, dried fruits, beans, or peas, leafy green vegetables, and organ meats (liver, kidney). What side effects may I notice from receiving this medicine? Side effects that you should report to your doctor or health care professional as soon as possible: -allergic reactions like skin rash, itching or hives, swelling of the face, lips, or tongue -breathing problems -changes in blood pressure -feeling faint or lightheaded, falls -flushing, sweating, or hot feelings Side effects that usually do not require medical attention (report to your doctor or health care professional if they continue or are bothersome): -changes in taste -constipation -dizziness -headache -nausea -pain, redness, or irritation at site where injected -vomiting This list may not describe all possible side effects. Call your doctor for medical advice about side effects. You may report side effects to FDA at 1-800-FDA-1088. Where should I keep my medicine? This drug is given in a hospital or clinic and will not be stored at home. NOTE: This sheet is a summary. It may not cover all possible information. If you have questions about this medicine, talk to your doctor, pharmacist, or health care provider.  2018 Elsevier/Gold Standard (2015-08-30 11:20:47)  

## 2017-04-21 ENCOUNTER — Encounter: Payer: Self-pay | Admitting: Internal Medicine

## 2017-04-21 MED ORDER — ALMOTRIPTAN MALATE 12.5 MG PO TABS: 12.5000 mg | ORAL_TABLET | ORAL | 3 refills | Status: DC | PRN

## 2017-04-21 NOTE — Telephone Encounter (Signed)
This has not been filled since 2016

## 2017-04-30 DIAGNOSIS — Z1151 Encounter for screening for human papillomavirus (HPV): Secondary | ICD-10-CM | POA: Diagnosis not present

## 2017-04-30 DIAGNOSIS — Z01419 Encounter for gynecological examination (general) (routine) without abnormal findings: Secondary | ICD-10-CM | POA: Diagnosis not present

## 2017-04-30 DIAGNOSIS — Z6837 Body mass index (BMI) 37.0-37.9, adult: Secondary | ICD-10-CM | POA: Diagnosis not present

## 2017-05-05 ENCOUNTER — Other Ambulatory Visit: Payer: Self-pay | Admitting: Internal Medicine

## 2017-05-07 ENCOUNTER — Ambulatory Visit (INDEPENDENT_AMBULATORY_CARE_PROVIDER_SITE_OTHER): Payer: BLUE CROSS/BLUE SHIELD | Admitting: Neurology

## 2017-05-07 DIAGNOSIS — R519 Headache, unspecified: Secondary | ICD-10-CM

## 2017-05-07 DIAGNOSIS — E0840 Diabetes mellitus due to underlying condition with diabetic neuropathy, unspecified: Secondary | ICD-10-CM

## 2017-05-07 DIAGNOSIS — R51 Headache: Principal | ICD-10-CM

## 2017-05-07 DIAGNOSIS — G4733 Obstructive sleep apnea (adult) (pediatric): Secondary | ICD-10-CM | POA: Diagnosis not present

## 2017-05-07 DIAGNOSIS — F513 Sleepwalking [somnambulism]: Secondary | ICD-10-CM

## 2017-05-08 NOTE — Procedures (Signed)
PATIENT'S NAME:  Lisa Duran, Lisa Duran DOB:      1981-05-24      MR#:    [id number]     DATE OF RECORDING: 05/07/2017 REFERRING M.D.:  Billey Gosling MD Study Performed:   Baseline Polysomnogram HISTORY:   Excessive exhaustion, sleepiness and a HST result with AHI of 8/hr.   Sleep walking, headaches, OSA, diabetes, morbid obesity, ADD, achiness and pain. The patient endorsed the Epworth Sleepiness Scale at 11 points, the FSS at 58 points.   The patient's weight 218 pounds with a height of 64 (inches), resulting in a BMI of 37.3 kg/m2. The patient's neck circumference measured 15 inches.  CURRENT MEDICATIONS: Axert, Xanax, Adderall, Celexa, Flonase, Glipizide, Lantus, Januvia, Lamictal, Metformin, Mirena IUD, Toprol, Prilosec, Phenergan, Ultracet, Kenalog   PROCEDURE:  This is a multichannel digital polysomnogram utilizing the Somnostar 11.2 system.  Electrodes and sensors were applied and monitored per AASM Specifications.   EEG, EOG, Chin and Limb EMG, were sampled at 200 Hz.  ECG, Snore and Nasal Pressure, Thermal Airflow, Respiratory Effort, CPAP Flow and Pressure, Oximetry was sampled at 50 Hz. Digital video and audio were recorded.      BASELINE STUDY  Lights Out was at 22:24 and Lights On at 04:59.  Total recording time (TRT) was 395 minutes, with a total sleep time (TST) of 333.5 minutes.   The patient's sleep latency was 33.5 minutes.  REM latency was 80.5 minutes. The sleep efficiency was 84.4 %.     SLEEP ARCHITECTURE: WASO (Wake after sleep onset) was 27 minutes.  There were 19 minutes in Stage N1, 202.5 minutes Stage N2, 86 minutes Stage N3 and 26 minutes in Stage REM.  The percentage of Stage N1 was 5.7%, Stage N2 was 60.7%, Stage N3 was 25.8% and Stage R (REM sleep) was 7.8%.   The arousals were noted as: 46 were spontaneous, 0 were associated with PLMs, and 0 were associated with respiratory events. Audio and video analysis did not show any abnormal or unusual movements, behaviors, phonations  or vocalizations.   Snoring was noted. EKG was in keeping with normal sinus rhythm (NSR).  RESPIRATORY ANALYSIS:  There were a total of 2 respiratory events:  0 obstructive apneas, 0 central apneas and 0 mixed apneas with a total of 0 apneas and an apnea index (AI) of 0 /hour. There were 2 hypopneas with a hypopnea index of .4 /hour. The patient also had 0 respiratory event related arousals (RERAs).     The total APNEA/HYPOPNEA INDEX (AHI) was 0.4/hour and the total RESPIRATORY DISTURBANCE INDEX was 0.4 /hour.  0 events occurred in REM sleep and 4 events in NREM. The REM AHI was 0.0 /hour, versus a non-REM AHI of 0.4. The patient spent 107 minutes of total sleep time in the supine position and 227 minutes in non-supine. The supine AHI was 0.6 versus a non-supine AHI of 0.3.  OXYGEN SATURATION & C02:  The Wake baseline 02 saturation was 97%, with the lowest being 87%. Time spent below 89% saturation equaled 10 minutes.   PERIODIC LIMB MOVEMENTS:  The patient had a total of 0 Periodic Limb Movements. The arousals were noted as: 46 were spontaneous, 0 were associated with PLMs, and 0 were associated with respiratory events. Audio and video analysis did not show any abnormal or unusual movements, behaviors, phonations or vocalizations.   Snoring was noted. EKG was in keeping with normal sinus rhythm (NSR).   IMPRESSION:  1. No evidence of Sleep Apnea(OSA), Periodic Limb Movement  Disorder (PLMD), Minimal Primary Snoring 2. Could not capture Sleepwalking 3. Many spontaneous arousals, fragmented sleep due to discomfort?  4.   RECOMMENDATIONS:  1. No physiological- organic sleep disorder identified. Consider rheumatology or pain specialist referral.  Consider dedicated sleep psychology referral if insomnia is of clinical concern.   2. A follow up appointment can be scheduled in the Sleep Clinic at [facility] if desired. The referring provider will be notified of the results.       I certify that I have reviewed the entire raw data recording prior to the issuance of this report in accordance with the Standards of Accreditation of the American Academy of Sleep Medicine (AASM)    Larey Seat, MD        05-08-2017 Diplomat, American Board of Psychiatry and Neurology  Diplomat, American Board of Branch Director, [facility] [employer]

## 2017-05-12 ENCOUNTER — Telehealth: Payer: Self-pay | Admitting: Neurology

## 2017-05-12 NOTE — Telephone Encounter (Signed)
Called to discuss sleep study results. No answer. LVM for pt to return call.

## 2017-05-12 NOTE — Telephone Encounter (Signed)
-----   Message from Larey Seat, MD sent at 05/08/2017 12:19 PM EDT ----- There is sleep fragmentation noted, but it is not correlated to physiological- organic sleep functions. No apnea, PLM, nocturia, arrhythmia , etc. No explanation for headaches, either.  No follow up needed.

## 2017-05-13 ENCOUNTER — Institutional Professional Consult (permissible substitution): Payer: Self-pay | Admitting: Pulmonary Disease

## 2017-05-13 NOTE — Telephone Encounter (Signed)
Pt returned RN's call °

## 2017-05-13 NOTE — Telephone Encounter (Signed)
Called patient back and spoke with her in regards to sleep study. Pt had no question and verbalized understanding

## 2017-05-24 ENCOUNTER — Other Ambulatory Visit: Payer: Self-pay | Admitting: Internal Medicine

## 2017-05-25 ENCOUNTER — Ambulatory Visit (INDEPENDENT_AMBULATORY_CARE_PROVIDER_SITE_OTHER): Payer: BLUE CROSS/BLUE SHIELD | Admitting: Internal Medicine

## 2017-05-25 ENCOUNTER — Encounter: Payer: Self-pay | Admitting: Internal Medicine

## 2017-05-25 VITALS — BP 118/76 | HR 92 | Temp 98.0°F | Wt 223.0 lb

## 2017-05-25 DIAGNOSIS — E1149 Type 2 diabetes mellitus with other diabetic neurological complication: Secondary | ICD-10-CM | POA: Diagnosis not present

## 2017-05-25 LAB — POCT GLYCOSYLATED HEMOGLOBIN (HGB A1C): Hemoglobin A1C: 6.2

## 2017-05-25 NOTE — Patient Instructions (Addendum)
Please continue: - Lantus 32 units at bedtime - Metformin 1000 mg 2x a day - Januvia 100 mg in am - Glipizide ER 5 mg daily in am, before b'fast  Please return in 3-4 months with your sugar log.

## 2017-05-25 NOTE — Progress Notes (Signed)
Subjective:     Patient ID: Lisa Duran, female   DOB: October 24, 1980, 36 y.o.   MRN: [id number]  HPI Ms Lisa Duran is a pleasant 36 y.o. woman returning for f/u for DM2, insulin dependent, uncontrolled, dx 61/4431, w/o complications. Last visit 5 mo ago.  She was dx'ed with fibromyalgia.   She also has IDA (Under investigation by hematology) >> had 2 iron infusions: 03/2017 and 04/2017. She feels much better after the infusions.  She just got out of a 5 year relationship since last visit. She is under a lot of stress.   She did start to change her diet after I suggested a plant-based diet at last visi She still eats meat, but includes more whole food plant-based meals.    Last HbA1c: Lab Results  Component Value Date   HGBA1C 7.6 12/25/2016   HGBA1C 9.5 08/19/2016   HGBA1C 8.6 (H) 12/20/2015   She is on: - Lantus 24 units at bedtime - started 03/2015 >> 32 units - Metformin 1000 mg 2x a day - Januvia 100 mg in am  - Glipizide ER 5 mg daily in am We tried Cycloset 0.8 mg daily >> stopped end of 11/2016 b/c GERD/AP/C We tried Trulicity >> could not tolerate it: N/V/AP. She restarted Januvia 09/2016. She was on Invokana 100 mg in am (started 12/2014) >> 2 yeast inf >> tx with Diflucan; she was exhausted and had nocturia >> stopped 03/12/2015 Tried Victoza 2 mo ago >> AP, severe GERD, inj site rxn Tried Januvia >> tolerated it well, but stopped when Invokana was suggested.  She was also on Actos, but taken off b/c good control.  She has a Chief Technology Officer.  She checks sugars 1x a day: - am: 99-123 >> 138, 199 >> 78-161 (most 90-115) - before lunch:  220s >> 182 >> 117, 183 (no metf.) >> 74-126 - 2h after lunch:  n/c >> 151 >> 149-164 >> 84-172 (most 128-141) - before dinner: 119-143 >> 125, 155 >>  80-137 - 2h post dinner:  176-201 >> 141 >> n/c >> 119-207 (most 140-156) - b/t: 159-190 >> n/c Hypoglycemia awareness at 80s   No CKD. Lab Results  Component Value Date   BUN 9.0 03/13/2017    Lab Results  Component Value Date   CREATININE 0.8 03/13/2017   - latest lipid panel: Lab Results  Component Value Date   CHOL 161 02/03/2017   HDL 58.60 02/03/2017   LDLCALC 80 02/03/2017   TRIG 113.0 02/03/2017   CHOLHDL 3 02/03/2017   Last eye exam 10/2016 >> No DR - [facility]. + numbness and tingling in hands and feet.  She had CP >> had a stress test 07/04/2015 >> normal.   On Metoprolol to control her heart rate.  Past Medical History:  Diagnosis Date  . ADHD (attention deficit hyperactivity disorder)   . ALLERGIC RHINITIS   . Allergy   . Anemia   . ANXIETY   . Depression   . DIABETES MELLITUS, TYPE II   . Fibromyalgia 2018  . GERD (gastroesophageal reflux disease)   . MIGRAINE, COMMON   . Obesity, unspecified   . OVARIAN CYST    Past Surgical History:  Procedure Laterality Date  . NO PAST SURGERIES     History   Social History  . Marital Status: Married    Spouse Name: N/A    Number of Children: 0   Occupational History  . Estate agent   Social History  Main Topics  . Smoking status: Was smoking 1/2 PPD, quit 2009  . Smokeless tobacco: No     Comment: single, separated from spouse 02/2010.   [name] Alcohol Use: Yes, mixed, 2-4 x mo, 1-2 drinks at a time  . Drug Use: No   Social History Narrative   Single, separated from spouse 02/2010   Current Outpatient Prescriptions on File Prior to Visit  Medication Sig Dispense Refill  . almotriptan (AXERT) 12.5 MG tablet Take 1 tablet (12.5 mg total) by mouth as needed for migraine. may repeat in 2 hours if needed 12 tablet 3  . ALPRAZolam (XANAX) 0.5 MG tablet Take 1 tablet (0.5 mg total) by mouth 3 (three) times daily as needed. for anxiety 30 tablet 0  . amphetamine-dextroamphetamine (ADDERALL XR) 20 MG 24 hr capsule Take 1 capsule (20 mg total) by mouth daily. 30 capsule 0  . BD PEN NEEDLE NANO U/F 32G X 4 MM MISC USE ONCE A DAY 90 each 3  . citalopram (CELEXA) 20 MG  tablet TAKE 1 TABLET BY MOUTH  DAILY 90 tablet 1  . fluticasone (FLONASE) 50 MCG/ACT nasal spray Place 2 sprays into both nostrils daily.   1  . glipiZIDE (GLUCOTROL XL) 5 MG 24 hr tablet Take 1 tablet (5 mg total) by mouth daily with breakfast. 90 tablet 3  . glucose blood (ONETOUCH VERIO) test strip Use 2x a day 100 each 12  . Insulin Glargine (LANTUS SOLOSTAR) 100 UNIT/ML Solostar Pen Inject 32 Units into the skin at bedtime. 30 mL 1  . Insulin Pen Needle 32G X 4 MM MISC Use 1x a day 100 each 3  . JANUVIA 100 MG tablet TAKE 1 TABLET BY MOUTH  DAILY 90 tablet 1  . lamoTRIgine (LAMICTAL) 100 MG tablet TAKE 1 TABLET BY MOUTH TWO  TIMES DAILY 180 tablet 1  . levonorgestrel (MIRENA) 20 MCG/24HR IUD 1 Intra Uterine Device (1 each total) by Intrauterine route once. 1 each 0  . metFORMIN (GLUCOPHAGE) 1000 MG tablet TAKE 1/2 TABLET BY MOUTH  TWICE A DAY WITH A MEAL 90 tablet 1  . metoprolol succinate (TOPROL-XL) 25 MG 24 hr tablet TAKE ONE-HALF TABLET BY  MOUTH DAILY 45 tablet 1  . omeprazole (PRILOSEC) 40 MG capsule TAKE 1 CAPSULE BY MOUTH  DAILY BEFORE SUPPER. 90 capsule 3  . Prenatal Vit-Fe Fumarate-FA (MULTIVITAMIN-PRENATAL) 27-0.8 MG TABS tablet Take 1 tablet by mouth daily at 12 noon.    . promethazine (PHENERGAN) 25 MG tablet Take 25 mg by mouth every 6 (six) hours as needed for nausea.    . traMADol-acetaminophen (ULTRACET) 37.5-325 MG per tablet Take 1 tablet by mouth every 6 (six) hours as needed for pain.    [name] triamcinolone cream (KENALOG) 0.1 % Apply topically 2 (two) times daily as needed. 30 g 1   No current facility-administered medications on file prior to visit.    Allergies  Allergen Reactions  . Propofol Shortness Of Breath    SOB, chest tightness, wheezing after colonoscopy on 09-18-15  . Chicken Allergy   . Gluten Meal Diarrhea  . Other     Any Nuts   . Victoza [Liraglutide] Rash    Family History  Problem Relation Age of Onset  . Diabetes Mother   . Hyperlipidemia  Mother   . Diabetes Father   . Hyperlipidemia Father   . Coronary artery disease Father   . Hypertension Father   . Cancer Father        esoph  .  Stroke Father   . Cancer Maternal Grandfather 44       Prostate and Lung  . Diabetes Sister        Gestational  . Berenice Primas' disease Maternal Aunt   . Cancer Maternal Uncle        Brain  . Stroke Paternal Grandmother   . Heart disease Paternal Grandmother   . Stroke Paternal Grandfather   . Heart disease Paternal Grandfather   . Diabetes Sister        Gestational and Type II  . Hashimoto's thyroiditis Cousin   . Cancer Maternal Uncle        Brain   Review of Systems Constitutional: no weight gain/no weight loss, no fatigue, no subjective hyperthermia, no subjective hypothermia Eyes: no blurry vision, no xerophthalmia ENT: no sore throat, no nodules palpated in throat, no dysphagia, no odynophagia, no hoarseness Cardiovascular: no CP/no SOB/no palpitations/no leg swelling Respiratory: no cough/no SOB/no wheezing Gastrointestinal: no N/no V/no D/no C/no acid reflux Musculoskeletal: no muscle aches/no joint aches Skin: no rashes, no hair loss Neurological: no tremors/+ numbness/+ tingling/no dizziness  I reviewed pt's medications, allergies, PMH, social hx, family hx, and changes were documented in the history of present illness. Otherwise, unchanged from my initial visit note.    Objective:   Physical Exam BP 118/76   Pulse 92   Temp 98 F (36.7 C) (Oral)   Wt 223 lb (101.2 kg)   SpO2 98%   BMI 38.28 kg/m    Body mass index is 38.28 kg/m. Wt Readings from Last 3 Encounters:  05/25/17 223 lb (101.2 kg)  04/06/17 219 lb (99.3 kg)  03/27/17 215 lb 1.6 oz (97.6 kg)   Constitutional: overweight, in NAD Eyes: PERRLA, EOMI, no exophthalmos ENT: moist mucous membranes, no thyromegaly, no cervical lymphadenopathy Cardiovascular: tachycardia, RR, No MRG Respiratory: CTA B Gastrointestinal: abdomen soft, NT, ND,  BS+ Musculoskeletal: no deformities, strength intact in all 4 Skin: moist, warm, no rashes Neurological: no tremor with outstretched hands, DTR normal in all 4   Assessment:     1. DM2, insulin-dependent, uncontrolled, without complications - r/o autoimmunity Component     Latest Ref Rng 08/17/2012  Glutamic Acid Decarb Ab     <=1.0 U/mL <1.0  Pancreatic Islet Cell Antibody     < 5 JDF Units <5  C-Peptide     0.80 - 3.90 ng/mL 1.84  Glucose     70 - 99 mg/dL 104 (H)     2. Obesity   Plan:     1. Patient with long-standing, Uncontrolled diabetes, on a complex regimen to which we added glipizide at last visit. Sugars are much better. She feels that her diet helped with this but also the glipizide. She is also feeling much better after her iron infusion, so this may have contributed also. - We discussed about further improvements in her diet, but for now, no changes are needed in her regimen. At next visit, we can hopefully start decreasing her Lantus - I suggested to:  Patient Instructions  Please continue: - Lantus 32 units at bedtime - Metformin 1000 mg 2x a day - Januvia 100 mg in am - Glipizide ER 5 mg daily in am, before b'fast  Please return in 3-4 months with your sugar log.   - today, HbA1c is 6.2% (MUCH better!) - continue checking sugars at different times of the day - check 1x a day, rotating checks - advised for yearly eye exams >> she is UTD -  Return to clinic in 3-4 mo with sugar log   2. Obesity  -  we again reviewed her diet: She is eating more whole food plant-based meals and planning her meals ahead, which helps - she initially lost weight, but gained back 8 pounds in the last month due to stress with her relationship and also with her new diagnosis of fibromyalgia and so far, idiopathic, iron deficiency anemia   Philemon Kingdom, MD PhD [facility] Endocrinology

## 2017-05-25 NOTE — Addendum Note (Signed)
Addended by: Dorna Leitz on: 05/25/2017 04:43 PM   Modules accepted: Orders

## 2017-05-26 NOTE — Progress Notes (Signed)
[name]    HEMATOLOGY/ONCOLOGY CLINIC NOTE  Date of Service: 05/27/17    Patient Care Team: Binnie Rail, MD as PCP - General (Internal Medicine) Servando Salina, MD as Consulting Physician (Obstetrics and Gynecology) Harold Hedge, Darrick Grinder, MD (Allergy and Immunology) Philemon Kingdom, MD (Endocrinology) Dennard Nip, NP as Nurse Practitioner (Psychiatry) Calvert Cantor, MD (Ophthalmology) Pieter Partridge, DO (Neurology)  CHIEF COMPLAINTS/PURPOSE OF CONSULTATION:  Anemia and thrombocytosis  HISTORY OF PRESENTING ILLNESS:   Lisa Duran is a wonderful 36 y.o. female who has been referred to [place] by Dr .Quay Burow, Claudina Lick, MD for evaluation and management of evaluation of Anemia and thrombocytosis.  Patient has h/o fibromyalga, significant eczema, possible IBS (self decided to pursue gluten free diet), DM2, ADHD, anxiety, obesity had labs with her PCP On 02/03/2017 and was noted to have anemia with a hgb of 10.8 with MCV of 66.3 with elevated platelets of 573k with borderline leucocytosis of 12.7 with  Eosinophilia of 1.3k.  Hgb 1  Yr ago was 11.6 with MCV of 74.6 and platelets of 538k  She has had significantly elevated inflammatory markers (sed rate 112) and has been in the process of having a rheumatology w/u with Gavin Pound MD.  No fatigue but no evident LNadenopathy, splenomegaly . No fevers/chills. Notes that she has had a Mirena IUD and notes no significant menstrual losses. No other overt bleeding. Has been on chronic PPI therapy. No overt weight loss or other focal symptoms suggestive of malignancy. Patient has previosuly had EGD in 09/2015 that showed mild pan gastritis and HH without cameron ulcers.   INTERVAL HISTORY  Patient is here for f/u of heriron deficiency anemia. She notes that she tolerated the IV Iron well and notes that she feels much better. Her hgb has improved to 13.7 today 05/27/2017. She reports that she is doing well overall. She is not taking  B12 vitamins. She reports she went 2 years without having menstrual cycle until last month due to an IUD. Denies and abdominal symptoms or evidence of GI bleeding. NO usual weight loss.  On review of systems, pt reports join pain and denies skin rashes, abdominal pain, leg swelling, fever, chills and any acute new symptoms.     MEDICAL HISTORY:  Past Medical History:  Diagnosis Date  . ADHD (attention deficit hyperactivity disorder)   . ALLERGIC RHINITIS   . Allergy   . Anemia   . ANXIETY   . Depression   . DIABETES MELLITUS, TYPE II   . Fibromyalgia 2018  . GERD (gastroesophageal reflux disease)   . MIGRAINE, COMMON   . Obesity, unspecified   . OVARIAN CYST     SURGICAL HISTORY: Past Surgical History:  Procedure Laterality Date  . NO PAST SURGERIES      SOCIAL HISTORY: Social History   Social History  . Marital status: Married    Spouse name: N/A  . Number of children: N/A  . Years of education: N/A   Occupational History  . Not on file.   Social History Main Topics  . Smoking status: Former Smoker    Quit date: 08/20/2002  . Smokeless tobacco: Never Used     Comment: single, separated from spouse 02/2010.   [name] Alcohol use Yes     Comment: rarely  . Drug use: No  . Sexual activity: Not Currently    Birth control/ protection: IUD   Other Topics Concern  . Not on file   Social History Narrative   Single, separated  from spouse 02/2010    FAMILY HISTORY: Family History  Problem Relation Age of Onset  . Diabetes Mother   . Hyperlipidemia Mother   . Diabetes Father   . Hyperlipidemia Father   . Coronary artery disease Father   . Hypertension Father   . Cancer Father        esoph  . Stroke Father   . Cancer Maternal Grandfather 74       Prostate and Lung  . Diabetes Sister        Gestational  . Berenice Primas' disease Maternal Aunt   . Cancer Maternal Uncle        Brain  . Stroke Paternal Grandmother   . Heart disease Paternal Grandmother   . Stroke  Paternal Grandfather   . Heart disease Paternal Grandfather   . Diabetes Sister        Gestational and Type II  . Hashimoto's thyroiditis Cousin   . Cancer Maternal Uncle        Brain    ALLERGIES:  is allergic to propofol; chicken allergy; gluten meal; other; and victoza [liraglutide].  MEDICATIONS:  Current Outpatient Prescriptions  Medication Sig Dispense Refill  . almotriptan (AXERT) 12.5 MG tablet Take 1 tablet (12.5 mg total) by mouth as needed for migraine. may repeat in 2 hours if needed 12 tablet 3  . ALPRAZolam (XANAX) 0.5 MG tablet Take 1 tablet (0.5 mg total) by mouth 3 (three) times daily as needed. for anxiety 30 tablet 0  . amphetamine-dextroamphetamine (ADDERALL XR) 20 MG 24 hr capsule Take 1 capsule (20 mg total) by mouth daily. 30 capsule 0  . BD PEN NEEDLE NANO U/F 32G X 4 MM MISC USE ONCE A DAY 90 each 3  . citalopram (CELEXA) 20 MG tablet TAKE 1 TABLET BY MOUTH  DAILY 90 tablet 1  . fluticasone (FLONASE) 50 MCG/ACT nasal spray Place 2 sprays into both nostrils daily.   1  . glipiZIDE (GLUCOTROL XL) 5 MG 24 hr tablet Take 1 tablet (5 mg total) by mouth daily with breakfast. 90 tablet 3  . glucose blood (ONETOUCH VERIO) test strip Use 2x a day 100 each 12  . Insulin Glargine (LANTUS SOLOSTAR) 100 UNIT/ML Solostar Pen Inject 32 Units into the skin at bedtime. 30 mL 1  . Insulin Pen Needle 32G X 4 MM MISC Use 1x a day 100 each 3  . JANUVIA 100 MG tablet TAKE 1 TABLET BY MOUTH  DAILY 90 tablet 1  . lamoTRIgine (LAMICTAL) 100 MG tablet TAKE 1 TABLET BY MOUTH TWO  TIMES DAILY 180 tablet 1  . levonorgestrel (MIRENA) 20 MCG/24HR IUD 1 Intra Uterine Device (1 each total) by Intrauterine route once. 1 each 0  . metFORMIN (GLUCOPHAGE) 1000 MG tablet TAKE 1/2 TABLET BY MOUTH  TWICE A DAY WITH A MEAL (Patient taking differently: TAKE 1 TABLET BY MOUTH  TWICE A DAY WITH A MEAL) 90 tablet 1  . metoprolol succinate (TOPROL-XL) 25 MG 24 hr tablet TAKE ONE-HALF TABLET BY  MOUTH DAILY 45  tablet 1  . omeprazole (PRILOSEC) 40 MG capsule TAKE 1 CAPSULE BY MOUTH  DAILY BEFORE SUPPER. 90 capsule 3  . Prenatal Vit-Fe Fumarate-FA (MULTIVITAMIN-PRENATAL) 27-0.8 MG TABS tablet Take 1 tablet by mouth daily at 12 noon.    . promethazine (PHENERGAN) 25 MG tablet Take 25 mg by mouth every 6 (six) hours as needed for nausea.    . traMADol-acetaminophen (ULTRACET) 37.5-325 MG per tablet Take 1 tablet by mouth every 6 (  six) hours as needed for pain.    [name] triamcinolone cream (KENALOG) 0.1 % Apply topically 2 (two) times daily as needed. 30 g 1  . Cyanocobalamin (B-12) 1000 MCG SUBL Place 1,000 mcg under the tongue daily. 30 each 3  . iron polysaccharides (NIFEREX) 150 MG capsule Take 1 capsule (150 mg total) by mouth daily. 30 capsule 5   No current facility-administered medications for this visit.     REVIEW OF SYSTEMS:    10 Point review of Systems was done is negative except as noted above.  PHYSICAL EXAMINATION: ECOG PERFORMANCE STATUS: 1 - Symptomatic but completely ambulatory  . Vitals:   05/27/17 0847  BP: 110/74  Pulse: 80  Resp: 18  Temp: 98 F (36.7 C)  SpO2: 99%   Filed Weights   05/27/17 0847  Weight: 223 lb 9.6 oz (101.4 kg)   .Body mass index is 38.38 kg/m.  GENERAL:alert, in no acute distress and comfortable SKIN: no acute rashes, no significant lesions EYES: conjunctiva are pink and non-injected, sclera anicteric OROPHARYNX: MMM, no exudates, no oropharyngeal erythema or ulceration NECK: supple, no JVD LYMPH:  no palpable lymphadenopathy in the cervical, axillary or inguinal regions LUNGS: clear to auscultation b/l with normal respiratory effort HEART: regular rate & rhythm ABDOMEN:  normoactive bowel sounds , non tender, not distended. No palpable hepatosplenomegaly. Extremity: no pedal edema PSYCH: alert & oriented x 3 with fluent speech NEURO: no focal motor/sensory deficits  LABORATORY DATA:  I have reviewed the data as listed  . CBC Latest Ref  Rng & Units 05/27/2017 03/13/2017 03/13/2017  WBC 3.9 - 10.3 10e3/uL 10.2 9.3 -  Hemoglobin 11.6 - 15.9 g/dL 13.7 10.3(L) -  Hematocrit 34.8 - 46.6 % 42.0 33.9(L) 32.7(L)  Platelets 145 - 400 10e3/uL 361 447(H) -   . CBC    Component Value Date/Time   WBC 10.2 05/27/2017 0838   WBC 12.7 (H) 02/03/2017 1621   RBC 5.30 05/27/2017 0838   RBC 5.25 (H) 02/03/2017 1621   HGB 13.7 05/27/2017 0838   HCT 42.0 05/27/2017 0838   PLT 361 05/27/2017 0838   MCV 79.4 (L) 05/27/2017 0838   MCH 25.8 05/27/2017 0838   MCH 22.8 (L) 07/19/2014 1905   MCHC 32.5 05/27/2017 0838   MCHC 31.0 02/03/2017 1621   RDW 30.3 (H) 05/27/2017 0838   LYMPHSABS 2.1 05/27/2017 0838   MONOABS 0.8 05/27/2017 0838   EOSABS 0.5 05/27/2017 0838   BASOSABS 0.1 05/27/2017 0838     . CMP Latest Ref Rng & Units 03/13/2017 02/03/2017 12/20/2015  Glucose 70 - 140 mg/dl 89 92 229(H)  BUN 7.0 - 26.0 mg/dL 9.0 10 9  Creatinine 0.6 - 1.1 mg/dL 0.8 0.73 0.77  Sodium 136 - 145 mEq/L 138 138 136  Potassium 3.5 - 5.1 mEq/L 4.1 4.0 4.2  Chloride 96 - 112 mEq/L - 103 100  CO2 22 - 29 mEq/L 26 27 27   Calcium 8.4 - 10.4 mg/dL 9.4 9.7 9.4  Total Protein 6.4 - 8.3 g/dL 7.6 7.7 7.8  Total Bilirubin 0.20 - 1.20 mg/dL 0.26 0.2 0.3  Alkaline Phos 40 - 150 U/L 140 134(H) 122(H)  AST 5 - 34 U/L 16 12 21   ALT 0 - 55 U/L 20 12 28    Component     Latest Ref Rng & Units 03/13/2017  Iron     41 - 142 ug/dL 16 (L)  TIBC     236 - 444 ug/dL 383  UIBC  120 - 384 ug/dL 367  %SAT     21 - 57 % 4 (L)  Folate, Hemolysate     Not Estab. ng/mL 260.3  HCT     34.0 - 46.6 % 32.7 (L)  Folate, RBC     >498 ng/mL 796  Sed Rate     0 - 32 mm/hr 30  CRP     0.0 - 4.9 mg/L 34.3 (H)  LDH     125 - 245 U/L 144  Ferritin     9 - 269 ng/ml 15  Vitamin B12     232 - 1,245 pg/mL 388   . Lab Results  Component Value Date   IRON 59 05/27/2017   TIBC 275 05/27/2017   IRONPCTSAT 21 05/27/2017   (Iron and TIBC)  Lab Results  Component Value  Date   FERRITIN 364 (H) 05/27/2017    RADIOGRAPHIC STUDIES: I have personally reviewed the radiological images as listed and agreed with the findings in the report. No results found.  ASSESSMENT & PLAN:   36 yo with    1) Chronic worsening Microcytic Anemia - primarily from Iron deficiency. Noted to have Iron deficiency with ferritin level of 15 with 4% iron saturation. Sed rate better with significantly elevated CRP levels - still suggest significant inflammation.  -given her significant fatigue and chronic inflammation would plan to target a ferritin level of >100. -she notes that she does not tolerate po iron well, also is on chronic PPI therapy -- so she was offered option for IV Iron and tolerated this well with resolution of Iron deficiency and Anemia   2) Chronic Thrombocytosis - likely reactive from Iron deficiency and inflammation. Resolved after adequate Iron replacement.  3) Mild leucocytosis - reacitve -- obesity, inflammation, Eczema, Adderall etc.resolved  4) Eosinophilia - likely from allergies/Eczema -- now resolved. Plan -patient tolerated the IV injectafer well and feels much better since having received this. Anemia and thrombocytosis have resolved. -ferritin levels are now >100 and no indication for additional IV Iron at this time -given her B12 levels were low normal she was recommended to take OTC B12 1033mcg daily and B complex to support accelerated erythropoiesis and since her chronic metformin use is a risk factor for B12 deficiency as well. -f/u with PCP to consider completion of GI workup for evaluation of Iron deficiency. -f/u with rheumatology for evaluation and management of possible rheumatological condition.  No clear indication for IV injectafer at this time. - oral iron and Bcomplex for maintenance therapy  -will reevaluate iron and b12 status and blood counts in 6 months  RTC with Dr Irene Limbo in 6 months with labs  Orders Placed This Encounter    Procedures  . CBC & Diff and Retic    Standing Status:   Future    Standing Expiration Date:   05/27/2018  . Comprehensive metabolic panel    Standing Status:   Future    Standing Expiration Date:   05/27/2018  . Ferritin    Standing Status:   Future    Standing Expiration Date:   05/27/2018  . Iron and TIBC    Standing Status:   Future    Standing Expiration Date:   05/27/2018  . Vitamin B12    Standing Status:   Future    Standing Expiration Date:   05/27/2018    All of the patients questions were answered with apparent satisfaction. The patient knows to call the clinic with any problems, questions or  concerns.  I spent 20 minutes counseling the patient face to face. The total time spent in the appointment was 25 minutes and more than 50% was on counseling and direct patient cares.    Sullivan Lone MD [place] AAHIVMS [facility] Hematology/Oncology Physician [facility]  (Office):       [phone] (Work cell):  [phone] (Fax):           [phone]  05/27/2017 9:12 AM   This document serves as a record of services personally performed by Sullivan Lone, MD. It was created on her behalf by Alean Rinne, a trained medical scribe. The creation of this record is based on the scribe's personal observations and the provider's statements to them. This document has been checked and approved by the attending provider.

## 2017-05-27 ENCOUNTER — Ambulatory Visit (HOSPITAL_BASED_OUTPATIENT_CLINIC_OR_DEPARTMENT_OTHER): Payer: BLUE CROSS/BLUE SHIELD | Admitting: Hematology

## 2017-05-27 ENCOUNTER — Encounter: Payer: Self-pay | Admitting: Hematology

## 2017-05-27 ENCOUNTER — Telehealth: Payer: Self-pay

## 2017-05-27 ENCOUNTER — Other Ambulatory Visit (HOSPITAL_BASED_OUTPATIENT_CLINIC_OR_DEPARTMENT_OTHER): Payer: BLUE CROSS/BLUE SHIELD

## 2017-05-27 VITALS — BP 110/74 | HR 80 | Temp 98.0°F | Resp 18 | Ht 64.0 in | Wt 223.6 lb

## 2017-05-27 DIAGNOSIS — E538 Deficiency of other specified B group vitamins: Secondary | ICD-10-CM

## 2017-05-27 DIAGNOSIS — D508 Other iron deficiency anemias: Secondary | ICD-10-CM | POA: Diagnosis not present

## 2017-05-27 DIAGNOSIS — D509 Iron deficiency anemia, unspecified: Secondary | ICD-10-CM

## 2017-05-27 DIAGNOSIS — D75839 Thrombocytosis, unspecified: Secondary | ICD-10-CM

## 2017-05-27 DIAGNOSIS — D473 Essential (hemorrhagic) thrombocythemia: Secondary | ICD-10-CM

## 2017-05-27 LAB — CBC & DIFF AND RETIC
BASO%: 0.8 % (ref 0.0–2.0)
BASOS ABS: 0.1 10*3/uL (ref 0.0–0.1)
EOS%: 4.6 % (ref 0.0–7.0)
Eosinophils Absolute: 0.5 10*3/uL (ref 0.0–0.5)
HEMATOCRIT: 42 % (ref 34.8–46.6)
HEMOGLOBIN: 13.7 g/dL (ref 11.6–15.9)
Immature Retic Fract: 9.3 % (ref 1.60–10.00)
LYMPH%: 20.4 % (ref 14.0–49.7)
MCH: 25.8 pg (ref 25.1–34.0)
MCHC: 32.5 g/dL (ref 31.5–36.0)
MCV: 79.4 fL — AB (ref 79.5–101.0)
MONO#: 0.8 10*3/uL (ref 0.1–0.9)
MONO%: 7.4 % (ref 0.0–14.0)
NEUT#: 6.8 10*3/uL — ABNORMAL HIGH (ref 1.5–6.5)
NEUT%: 66.8 % (ref 38.4–76.8)
Platelets: 361 10*3/uL (ref 145–400)
RBC: 5.3 10*6/uL (ref 3.70–5.45)
RDW: 30.3 % — AB (ref 11.2–14.5)
Retic %: 0.86 % (ref 0.70–2.10)
Retic Ct Abs: 45.58 10*3/uL (ref 33.70–90.70)
WBC: 10.2 10*3/uL (ref 3.9–10.3)
lymph#: 2.1 10*3/uL (ref 0.9–3.3)
nRBC: 0 % (ref 0–0)

## 2017-05-27 LAB — COMPREHENSIVE METABOLIC PANEL
ALBUMIN: 3.4 g/dL — AB (ref 3.5–5.0)
ALK PHOS: 152 U/L — AB (ref 40–150)
ALT: 29 U/L (ref 0–55)
AST: 17 U/L (ref 5–34)
Anion Gap: 11 mEq/L (ref 3–11)
BILIRUBIN TOTAL: 0.35 mg/dL (ref 0.20–1.20)
BUN: 8 mg/dL (ref 7.0–26.0)
CALCIUM: 9.2 mg/dL (ref 8.4–10.4)
CO2: 22 mEq/L (ref 22–29)
Chloride: 104 mEq/L (ref 98–109)
Creatinine: 0.8 mg/dL (ref 0.6–1.1)
Glucose: 166 mg/dl — ABNORMAL HIGH (ref 70–140)
POTASSIUM: 4.5 meq/L (ref 3.5–5.1)
Sodium: 137 mEq/L (ref 136–145)
Total Protein: 7.4 g/dL (ref 6.4–8.3)

## 2017-05-27 LAB — FERRITIN: FERRITIN: 364 ng/mL — AB (ref 9–269)

## 2017-05-27 LAB — IRON AND TIBC
%SAT: 21 % (ref 21–57)
Iron: 59 ug/dL (ref 41–142)
TIBC: 275 ug/dL (ref 236–444)
UIBC: 217 ug/dL (ref 120–384)

## 2017-05-27 MED ORDER — B-12 1000 MCG SL SUBL: 1000.0000 ug | SUBLINGUAL_TABLET | Freq: Every day | SUBLINGUAL | 3 refills | Status: DC

## 2017-05-27 MED ORDER — POLYSACCHARIDE IRON COMPLEX 150 MG PO CAPS: 150.0000 mg | ORAL_CAPSULE | Freq: Every day | ORAL | 5 refills | Status: DC

## 2017-05-27 NOTE — Patient Instructions (Signed)
Thank you for choosing [facility] to provide your oncology and hematology care.  To afford each patient quality time with our providers, please arrive 30 minutes before your scheduled appointment time.  If you arrive late for your appointment, you may be asked to reschedule.  We strive to give you quality time with our providers, and arriving late affects you and other patients whose appointments are after yours.   If you are a no show for multiple scheduled visits, you may be dismissed from the clinic at the providers discretion.    Again, thank you for choosing [facility], our hope is that these requests will decrease the amount of time that you wait before being seen by our physicians.  ______________________________________________________________________  Should you have questions after your visit to the [facility], please contact our office at [phone] between the hours of 8:30 and 4:30 p.m.    Voicemails left after 4:30p.m will not be returned until the following business day.    For prescription refill requests, please have your pharmacy contact us directly.  Please also try to allow 48 hours for prescription requests.    Please contact the scheduling department for questions regarding scheduling.  For scheduling of procedures such as PET scans, CT scans, MRI, Ultrasound, etc please contact central scheduling at [phone].    Resources For Cancer Patients and Caregivers:   Oncolink.org:  A wonderful resource for patients and healthcare providers for information regarding your disease, ways to tract your treatment, what to expect, etc.     American Cancer Society:  800-227-2345  Can help patients locate various types of support and financial assistance  Cancer Care: 1-800-813-HOPE (4673) Provides financial assistance, online support groups, medication/co-pay assistance.    [place] DSS:  336-641-3447 Where to apply for food  stamps, Medicaid, and utility assistance  Medicare Rights Center: 800-333-4114 Helps people with Medicare understand their rights and benefits, navigate the Medicare system, and secure the quality healthcare they deserve  SCAT: 336-333-6589 [place] [employer]'s shared-ride transportation service for eligible riders who have a disability that prevents them from riding the fixed route bus.    For additional information on assistance programs please contact our social worker:   Grier Hock/Abigail Elmore:  [phone]            

## 2017-05-27 NOTE — Telephone Encounter (Signed)
Printed avs and calender for upcoming appointments. Per 10/17 los

## 2017-06-05 ENCOUNTER — Other Ambulatory Visit: Payer: Self-pay

## 2017-06-05 ENCOUNTER — Telehealth: Payer: Self-pay | Admitting: Internal Medicine

## 2017-06-05 MED ORDER — INSULIN GLARGINE 100 UNIT/ML SOLOSTAR PEN: 32.0000 [IU] | PEN_INJECTOR | Freq: Every day | SUBCUTANEOUS | 1 refills | Status: DC

## 2017-06-05 NOTE — Telephone Encounter (Signed)
Printed the RX, will fax once I receive printed.

## 2017-06-05 NOTE — Telephone Encounter (Signed)
Faxed RX request.

## 2017-06-05 NOTE — Telephone Encounter (Signed)
Pt needs refills on lantus solostar to [facility] [phone] ir erx please

## 2017-06-08 ENCOUNTER — Encounter: Payer: Self-pay | Admitting: Internal Medicine

## 2017-06-08 ENCOUNTER — Other Ambulatory Visit: Payer: Self-pay

## 2017-06-08 MED ORDER — INSULIN GLARGINE 100 UNIT/ML SOLOSTAR PEN: PEN_INJECTOR | SUBCUTANEOUS | 1 refills | Status: DC

## 2017-06-08 MED ORDER — AMPHETAMINE-DEXTROAMPHET ER 20 MG PO CP24: 20.0000 mg | ORAL_CAPSULE | Freq: Every day | ORAL | 0 refills | Status: DC

## 2017-06-08 NOTE — Telephone Encounter (Signed)
[place] controlled substance database checked.  Ok to fill medication. rx printed 

## 2017-06-09 ENCOUNTER — Ambulatory Visit (INDEPENDENT_AMBULATORY_CARE_PROVIDER_SITE_OTHER): Payer: BLUE CROSS/BLUE SHIELD | Admitting: General Practice

## 2017-06-09 DIAGNOSIS — Z23 Encounter for immunization: Secondary | ICD-10-CM

## 2017-07-28 ENCOUNTER — Encounter: Payer: Self-pay | Admitting: Internal Medicine

## 2017-07-28 MED ORDER — AMPHETAMINE-DEXTROAMPHET ER 20 MG PO CP24: 20.0000 mg | ORAL_CAPSULE | Freq: Every day | ORAL | 0 refills | Status: DC

## 2017-07-28 NOTE — Telephone Encounter (Signed)
 Controlled Substance Database checked. Last filled on 06/23/17

## 2017-07-31 ENCOUNTER — Other Ambulatory Visit: Payer: Self-pay | Admitting: Internal Medicine

## 2017-08-06 ENCOUNTER — Ambulatory Visit (INDEPENDENT_AMBULATORY_CARE_PROVIDER_SITE_OTHER): Payer: BLUE CROSS/BLUE SHIELD | Admitting: Internal Medicine

## 2017-08-06 ENCOUNTER — Encounter: Payer: Self-pay | Admitting: Internal Medicine

## 2017-08-06 VITALS — BP 118/84 | HR 95 | Temp 98.3°F | Resp 18 | Wt 227.0 lb

## 2017-08-06 DIAGNOSIS — G43009 Migraine without aura, not intractable, without status migrainosus: Secondary | ICD-10-CM | POA: Diagnosis not present

## 2017-08-06 DIAGNOSIS — F419 Anxiety disorder, unspecified: Secondary | ICD-10-CM | POA: Diagnosis not present

## 2017-08-06 DIAGNOSIS — F32A Depression, unspecified: Secondary | ICD-10-CM

## 2017-08-06 DIAGNOSIS — M797 Fibromyalgia: Secondary | ICD-10-CM | POA: Diagnosis not present

## 2017-08-06 DIAGNOSIS — D508 Other iron deficiency anemias: Secondary | ICD-10-CM

## 2017-08-06 DIAGNOSIS — F329 Major depressive disorder, single episode, unspecified: Secondary | ICD-10-CM | POA: Diagnosis not present

## 2017-08-06 DIAGNOSIS — E1149 Type 2 diabetes mellitus with other diabetic neurological complication: Secondary | ICD-10-CM | POA: Diagnosis not present

## 2017-08-06 MED ORDER — ALPRAZOLAM 0.5 MG PO TABS: 0.5000 mg | ORAL_TABLET | Freq: Three times a day (TID) | ORAL | 0 refills | Status: DC | PRN

## 2017-08-06 MED ORDER — DULOXETINE HCL 30 MG PO CPEP: 30.0000 mg | ORAL_CAPSULE | Freq: Every day | ORAL | 5 refills | Status: DC

## 2017-08-06 NOTE — Patient Instructions (Addendum)
  Medications reviewed and updated.  Changes include stopping the celexa and starting cymbalta 30 mg daily.  Your prescription(s) have been submitted to your pharmacy. Please take as directed and contact our office if you believe you are having problem(s) with the medication(s).   Please followup in 2 months

## 2017-08-06 NOTE — Assessment & Plan Note (Signed)
Well-controlled on citalopram, but we will discontinue and start Cymbalta to see if this improves some of her fibromyalgia pain Start Cymbalta 30 mg daily

## 2017-08-06 NOTE — Assessment & Plan Note (Signed)
1-2 migraines a month Phenergan and Axert as needed Uses Ultracet on a rare occasion Continue above

## 2017-08-06 NOTE — Assessment & Plan Note (Signed)
Well-controlled Will discontinue citalopram and try Cymbalta in hopes that this improves some of her chronic pain from likely fibromyalgia Start Cymbalta 30 mg daily

## 2017-08-06 NOTE — Assessment & Plan Note (Signed)
Social rheumatology-no autoimmune process, agree she likely has fibromyalgia Discontinue citalopram and start Cymbalta 30 mg daily Stressed the importance of regular exercise Stressed good sleep and healthy diet Stressed the importance of keeping anxiety depression well controlled Follow-up in 2 months

## 2017-08-06 NOTE — Assessment & Plan Note (Signed)
Following with hematology Taking iron supplementation twice a week and has received IV iron Significantly improved energy level

## 2017-08-06 NOTE — Assessment & Plan Note (Signed)
Following with endocrine Management per Dr. Cruzita Lederer Continue regular exercise and weight loss efforts

## 2017-08-06 NOTE — Progress Notes (Signed)
Subjective:    Patient ID: Lisa Duran, female    DOB: Apr 14, 1981, 36 y.o.   MRN: [id number]  HPI The patient is here for follow up.  Diabetes: she is following with endo.  She is taking her medication daily as prescribed. She is compliant with a diabetic diet. She is exercising regularly-going to the gym 3 times a week and walking her dog twice daily.  Her sugars have been better controlled.  Chronic pain:  She continues to have b/l hip pain, lower back pain, pain between shoulder blades, inside of knees and b/l forearms.  She saw rheumatology and they feel it may be fibromyalgia.  We discussed at her last visit possibly changing the citalopram to Cymbalta and she was interested in trying that.  Migraine headaches: She has 1-2 migraine headaches a month.  Her current medication works well.  Depression, anxiety: She is taking the citalopram daily.  The dose is working well and she feels her anxiety and depression are controlled.  She is interested in trying the Cymbalta if it would help with her pain.  Medications and allergies reviewed with patient and updated if appropriate.  Patient Active Problem List   Diagnosis Date Noted  . Sleep-related headache 04/06/2017  . Sleep walking disorder 04/06/2017  . OSA (obstructive sleep apnea) 04/06/2017  . Iron deficiency anemia 03/27/2017  . Chronic fatigue 02/03/2017  . Arthralgia 02/03/2017  . Muscle pain 02/03/2017  . GERD (gastroesophageal reflux disease) 06/24/2015  . Eczema 06/22/2015  . Depression 06/22/2015  . Tachycardia 06/16/2014  . Paresthesias 06/12/2014  . Food allergy   . Wrist pain, right 09/14/2013  . Elbow pain, right 09/05/2013  . ADHD (attention deficit hyperactivity disorder) 01/27/2011  . Anxiety 02/20/2010  . ALLERGIC RHINITIS 11/21/2009  . OVARIAN CYST 11/21/2009  . Diabetes mellitus type 2 with neurological manifestations ([place]) 11/20/2009  . Morbid obesity ([place]) 11/20/2009  . Migraine without aura  11/20/2009    Current Outpatient Medications on File Prior to Visit  Medication Sig Dispense Refill  . almotriptan (AXERT) 12.5 MG tablet Take 1 tablet (12.5 mg total) by mouth as needed for migraine. may repeat in 2 hours if needed 12 tablet 3  . ALPRAZolam (XANAX) 0.5 MG tablet Take 1 tablet (0.5 mg total) by mouth 3 (three) times daily as needed. for anxiety 30 tablet 0  . amphetamine-dextroamphetamine (ADDERALL XR) 20 MG 24 hr capsule Take 1 capsule (20 mg total) by mouth daily. 30 capsule 0  . BD PEN NEEDLE NANO U/F 32G X 4 MM MISC USE ONCE A DAY 90 each 3  . citalopram (CELEXA) 20 MG tablet TAKE 1 TABLET BY MOUTH  DAILY 90 tablet 1  . Cyanocobalamin (B-12) 1000 MCG SUBL Place 1,000 mcg under the tongue daily. 30 each 3  . fluticasone (FLONASE) 50 MCG/ACT nasal spray Place 2 sprays into both nostrils daily.   1  . glipiZIDE (GLUCOTROL XL) 5 MG 24 hr tablet Take 1 tablet (5 mg total) by mouth daily with breakfast. 90 tablet 3  . glucose blood (ONETOUCH VERIO) test strip Use 2x a day 100 each 12  . Insulin Glargine (LANTUS SOLOSTAR) 100 UNIT/ML Solostar Pen Inject 32 units at bedtime. 15 pen 1  . Insulin Pen Needle 32G X 4 MM MISC Use 1x a day 100 each 3  . iron polysaccharides (NIFEREX) 150 MG capsule Take 1 capsule (150 mg total) by mouth daily. 30 capsule 5  . JANUVIA 100 MG tablet TAKE 1 TABLET  BY MOUTH  DAILY 90 tablet 1  . lamoTRIgine (LAMICTAL) 100 MG tablet TAKE 1 TABLET BY MOUTH TWO  TIMES DAILY 180 tablet 1  . levonorgestrel (MIRENA) 20 MCG/24HR IUD 1 Intra Uterine Device (1 each total) by Intrauterine route once. 1 each 0  . metFORMIN (GLUCOPHAGE) 1000 MG tablet TAKE ONE-HALF TABLET BY  MOUTH TWICE A DAY WITH A  MEAL 90 tablet 1  . metoprolol succinate (TOPROL-XL) 25 MG 24 hr tablet TAKE ONE-HALF TABLET BY  MOUTH DAILY 45 tablet 1  . omeprazole (PRILOSEC) 40 MG capsule TAKE 1 CAPSULE BY MOUTH  DAILY BEFORE SUPPER. 90 capsule 3  . Prenatal Vit-Fe Fumarate-FA  (MULTIVITAMIN-PRENATAL) 27-0.8 MG TABS tablet Take 1 tablet by mouth daily at 12 noon.    . promethazine (PHENERGAN) 25 MG tablet Take 25 mg by mouth every 6 (six) hours as needed for nausea.    . traMADol-acetaminophen (ULTRACET) 37.5-325 MG per tablet Take 1 tablet by mouth every 6 (six) hours as needed for pain.    [name] triamcinolone cream (KENALOG) 0.1 % Apply topically 2 (two) times daily as needed. 30 g 1   No current facility-administered medications on file prior to visit.     Past Medical History:  Diagnosis Date  . ADHD (attention deficit hyperactivity disorder)   . ALLERGIC RHINITIS   . Allergy   . Anemia   . ANXIETY   . Depression   . DIABETES MELLITUS, TYPE II   . Fibromyalgia 2018  . GERD (gastroesophageal reflux disease)   . MIGRAINE, COMMON   . Obesity, unspecified   . OVARIAN CYST     Past Surgical History:  Procedure Laterality Date  . NO PAST SURGERIES      Social History   Socioeconomic History  . Marital status: Married    Spouse name: None  . Number of children: None  . Years of education: None  . Highest education level: None  Social Needs  . Financial resource strain: None  . Food insecurity - worry: None  . Food insecurity - inability: None  . Transportation needs - medical: None  . Transportation needs - non-medical: None  Occupational History  . None  Tobacco Use  . Smoking status: Former Smoker    Last attempt to quit: 08/20/2002    Years since quitting: 14.9  . Smokeless tobacco: Never Used  . Tobacco comment: single, separated from spouse 02/2010.   Substance and Sexual Activity  . Alcohol use: Yes    Comment: rarely  . Drug use: No  . Sexual activity: Not Currently    Birth control/protection: IUD  Other Topics Concern  . None  Social History Narrative   Single, separated from spouse 02/2010    Family History  Problem Relation Age of Onset  . Diabetes Mother   . Hyperlipidemia Mother   . Diabetes Father   . Hyperlipidemia  Father   . Coronary artery disease Father   . Hypertension Father   . Cancer Father        esoph  . Stroke Father   . Cancer Maternal Grandfather 35       Prostate and Lung  . Diabetes Sister        Gestational  . Berenice Primas' disease Maternal Aunt   . Cancer Maternal Uncle        Brain  . Stroke Paternal Grandmother   . Heart disease Paternal Grandmother   . Stroke Paternal Grandfather   . Heart disease Paternal Grandfather   .  Diabetes Sister        Gestational and Type II  . Hashimoto's thyroiditis Cousin   . Cancer Maternal Uncle        Brain    Review of Systems  Constitutional: Positive for fatigue (much better). Negative for chills and fever.  Respiratory: Negative for cough, shortness of breath and wheezing.   Cardiovascular: Positive for leg swelling (mild). Negative for chest pain and palpitations.  Musculoskeletal: Positive for back pain and myalgias (3/10 on average day, 5/10 on bad day).  Neurological: Positive for light-headedness (much less) and headaches (sinus headaches, migraines 1-2 times a month). Negative for dizziness.       Objective:   Vitals:   08/06/17 1107  BP: 118/84  Pulse: 95  Resp: 18  Temp: 98.3 F (36.8 C)  SpO2: 97%   Wt Readings from Last 3 Encounters:  08/06/17 227 lb (103 kg)  05/27/17 223 lb 9.6 oz (101.4 kg)  05/25/17 223 lb (101.2 kg)   Body mass index is 38.96 kg/m.   Physical Exam    Constitutional: Appears well-developed and well-nourished. No distress.  HENT:  Head: Normocephalic and atraumatic.  Neck: Neck supple. No tracheal deviation present. No thyromegaly present.  No cervical lymphadenopathy Cardiovascular: Normal rate, regular rhythm and normal heart sounds.   No murmur heard. No carotid bruit .  No edema Pulmonary/Chest: Effort normal and breath sounds normal. No respiratory distress. No has no wheezes. No rales.  Skin: Skin is warm and dry. Not diaphoretic.  Psychiatric: Normal mood and affect. Behavior is  normal.      Assessment & Plan:    See Problem List for Assessment and Plan of chronic medical problems.

## 2017-08-25 ENCOUNTER — Ambulatory Visit: Payer: Self-pay | Admitting: Internal Medicine

## 2017-10-06 NOTE — Progress Notes (Signed)
Subjective:    Patient ID: Lisa Duran, female    DOB: 05-23-1981, 37 y.o.   MRN: [id number]  HPI The patient is here for follow up.  Fibromyalgia:  She has seen rheumatology.  She has chronic pain in her hips, lower back, between her shoulder blades, inside of knee and b/l forearms.  Two months ago we stopped her citalopram and started cymbalta.  She does have less pain days, so her fibromyalgia has improved.    She is exercising regularly - walking dog, yoga 2-3 times a week.    Depression, anxiety: She is taking her medication daily as prescribed. The celexa was effective, but we changed it to see if the cymbalta would improve her pain.  She denies any side effects from the medication. She feels different with this medication, which is hard to explain.  She feels more emotional.  She feels her depression/anxiety could be better controlled.  She rarely takes tramadol - it is used for her migraines only.    ADD:  She is taking her medication as prescribed.  She feels the medication is effective.  She denies side effects, including palpitations, headaches, lightheadedness, decreased appetite and weight loss.    Medications and allergies reviewed with patient and updated if appropriate.  Patient Active Problem List   Diagnosis Date Noted  . Sleep-related headache 04/06/2017  . Sleep walking disorder 04/06/2017  . OSA (obstructive sleep apnea) 04/06/2017  . Iron deficiency anemia 03/27/2017  . Chronic fatigue 02/03/2017  . Arthralgia 02/03/2017  . Fibromyalgia 02/03/2017  . GERD (gastroesophageal reflux disease) 06/24/2015  . Eczema 06/22/2015  . Depression 06/22/2015  . Tachycardia 06/16/2014  . Paresthesias 06/12/2014  . Food allergy   . Wrist pain, right 09/14/2013  . Elbow pain, right 09/05/2013  . ADHD (attention deficit hyperactivity disorder) 01/27/2011  . Anxiety 02/20/2010  . ALLERGIC RHINITIS 11/21/2009  . OVARIAN CYST 11/21/2009  . Diabetes mellitus type 2 with  neurological manifestations ([place]) 11/20/2009  . Morbid obesity ([place]) 11/20/2009  . Migraine without aura 11/20/2009    Current Outpatient Medications on File Prior to Visit  Medication Sig Dispense Refill  . almotriptan (AXERT) 12.5 MG tablet Take 1 tablet (12.5 mg total) by mouth as needed for migraine. may repeat in 2 hours if needed 12 tablet 3  . ALPRAZolam (XANAX) 0.5 MG tablet Take 1 tablet (0.5 mg total) by mouth 3 (three) times daily as needed. for anxiety 30 tablet 0  . BD PEN NEEDLE NANO U/F 32G X 4 MM MISC USE ONCE A DAY 90 each 3  . Cyanocobalamin (B-12) 1000 MCG SUBL Place 1,000 mcg under the tongue daily. 30 each 3  . glipiZIDE (GLUCOTROL XL) 5 MG 24 hr tablet Take 1 tablet (5 mg total) by mouth daily with breakfast. 90 tablet 3  . glucose blood (ONETOUCH VERIO) test strip Use 2x a day 100 each 12  . Insulin Glargine (LANTUS SOLOSTAR) 100 UNIT/ML Solostar Pen Inject 32 units at bedtime. 15 pen 1  . Insulin Pen Needle 32G X 4 MM MISC Use 1x a day 100 each 3  . iron polysaccharides (NIFEREX) 150 MG capsule Take 1 capsule (150 mg total) by mouth daily. 30 capsule 5  . JANUVIA 100 MG tablet TAKE 1 TABLET BY MOUTH  DAILY 90 tablet 1  . lamoTRIgine (LAMICTAL) 100 MG tablet TAKE 1 TABLET BY MOUTH TWO  TIMES DAILY 180 tablet 1  . levonorgestrel (MIRENA) 20 MCG/24HR IUD 1 Intra Uterine Device (1  each total) by Intrauterine route once. 1 each 0  . metFORMIN (GLUCOPHAGE) 1000 MG tablet TAKE ONE-HALF TABLET BY  MOUTH TWICE A DAY WITH A  MEAL 90 tablet 1  . metoprolol succinate (TOPROL-XL) 25 MG 24 hr tablet TAKE ONE-HALF TABLET BY  MOUTH DAILY 45 tablet 1  . omeprazole (PRILOSEC) 40 MG capsule TAKE 1 CAPSULE BY MOUTH  DAILY BEFORE SUPPER. 90 capsule 3  . Prenatal Vit-Fe Fumarate-FA (MULTIVITAMIN-PRENATAL) 27-0.8 MG TABS tablet Take 1 tablet by mouth daily at 12 noon.    . promethazine (PHENERGAN) 25 MG tablet Take 25 mg by mouth every 6 (six) hours as needed for nausea.    .  traMADol-acetaminophen (ULTRACET) 37.5-325 MG per tablet Take 1 tablet by mouth every 6 (six) hours as needed for pain.    [name] triamcinolone cream (KENALOG) 0.1 % Apply topically 2 (two) times daily as needed. 30 g 1   No current facility-administered medications on file prior to visit.     Past Medical History:  Diagnosis Date  . ADHD (attention deficit hyperactivity disorder)   . ALLERGIC RHINITIS   . Allergy   . Anemia   . ANXIETY   . Depression   . DIABETES MELLITUS, TYPE II   . Fibromyalgia 2018  . GERD (gastroesophageal reflux disease)   . MIGRAINE, COMMON   . Obesity, unspecified   . OVARIAN CYST     Past Surgical History:  Procedure Laterality Date  . NO PAST SURGERIES      Social History   Socioeconomic History  . Marital status: Single    Spouse name: None  . Number of children: None  . Years of education: None  . Highest education level: None  Social Needs  . Financial resource strain: None  . Food insecurity - worry: None  . Food insecurity - inability: None  . Transportation needs - medical: None  . Transportation needs - non-medical: None  Occupational History  . None  Tobacco Use  . Smoking status: Former Smoker    Last attempt to quit: 08/20/2002    Years since quitting: 15.1  . Smokeless tobacco: Never Used  . Tobacco comment: single, separated from spouse 02/2010.   Substance and Sexual Activity  . Alcohol use: Yes    Comment: rarely  . Drug use: No  . Sexual activity: Not Currently    Birth control/protection: IUD  Other Topics Concern  . None  Social History Narrative   Single, separated from spouse 02/2010    Family History  Problem Relation Age of Onset  . Diabetes Mother   . Hyperlipidemia Mother   . Diabetes Father   . Hyperlipidemia Father   . Coronary artery disease Father   . Hypertension Father   . Cancer Father        esoph  . Stroke Father   . Cancer Maternal Grandfather 29       Prostate and Lung  . Diabetes Sister          Gestational  . Berenice Primas' disease Maternal Aunt   . Cancer Maternal Uncle        Brain  . Stroke Paternal Grandmother   . Heart disease Paternal Grandmother   . Stroke Paternal Grandfather   . Heart disease Paternal Grandfather   . Diabetes Sister        Gestational and Type II  . Hashimoto's thyroiditis Cousin   . Cancer Maternal Uncle        Brain    Review  of Systems  Constitutional: Positive for fatigue. Negative for appetite change, fever and unexpected weight change.  Respiratory: Negative for shortness of breath.   Cardiovascular: Negative for chest pain, palpitations and leg swelling.  Gastrointestinal: Negative for nausea.  Musculoskeletal: Positive for arthralgias and myalgias.  Neurological: Positive for headaches (migraines 2/ month). Negative for dizziness and light-headedness.  Psychiatric/Behavioral: Positive for dysphoric mood and sleep disturbance (not restfull). The patient is nervous/anxious.        Objective:   Vitals:   10/07/17 0916  BP: 94/68  Pulse: 92  Resp: 16  Temp: 98 F (36.7 C)  SpO2: 97%   Wt Readings from Last 3 Encounters:  10/07/17 226 lb (102.5 kg)  08/06/17 227 lb (103 kg)  05/27/17 223 lb 9.6 oz (101.4 kg)   Body mass index is 38.79 kg/m.   Physical Exam    Constitutional: Appears well-developed and well-nourished. No distress.  HENT:  Head: Normocephalic and atraumatic.  Neck: Neck supple. No tracheal deviation present. No thyromegaly present.  No cervical lymphadenopathy Cardiovascular: Normal rate, regular rhythm and normal heart sounds.   No murmur heard. No carotid bruit .  No edema Pulmonary/Chest: Effort normal and breath sounds normal. No respiratory distress. No has no wheezes. No rales.  Skin: Skin is warm and dry. Not diaphoretic.  Psychiatric: Normal mood and affect. Behavior is normal.      Assessment & Plan:    See Problem List for Assessment and Plan of chronic medical problems.

## 2017-10-07 ENCOUNTER — Ambulatory Visit: Payer: BLUE CROSS/BLUE SHIELD | Admitting: Internal Medicine

## 2017-10-07 ENCOUNTER — Encounter: Payer: Self-pay | Admitting: Internal Medicine

## 2017-10-07 DIAGNOSIS — F419 Anxiety disorder, unspecified: Secondary | ICD-10-CM

## 2017-10-07 DIAGNOSIS — M797 Fibromyalgia: Secondary | ICD-10-CM | POA: Diagnosis not present

## 2017-10-07 DIAGNOSIS — F32A Depression, unspecified: Secondary | ICD-10-CM

## 2017-10-07 DIAGNOSIS — F909 Attention-deficit hyperactivity disorder, unspecified type: Secondary | ICD-10-CM

## 2017-10-07 DIAGNOSIS — F329 Major depressive disorder, single episode, unspecified: Secondary | ICD-10-CM

## 2017-10-07 MED ORDER — AMPHETAMINE-DEXTROAMPHET ER 20 MG PO CP24: 20.0000 mg | ORAL_CAPSULE | Freq: Every day | ORAL | 0 refills | Status: DC

## 2017-10-07 MED ORDER — DULOXETINE HCL 60 MG PO CPEP: 60.0000 mg | ORAL_CAPSULE | Freq: Every day | ORAL | 3 refills | Status: DC

## 2017-10-07 MED ORDER — FLUTICASONE PROPIONATE 50 MCG/ACT NA SUSP: 2.0000 | Freq: Every day | NASAL | 12 refills | Status: DC

## 2017-10-07 NOTE — Assessment & Plan Note (Signed)
Not as well controlled on the Cymbalta as the citalopram Will increase Cymbalta to 60 mg daily, which will hopefully help

## 2017-10-07 NOTE — Patient Instructions (Signed)
  Medications reviewed and updated.  Changes include increasing cymbalta to 60 mg daily  Your prescription(s) have been submitted to your pharmacy. Please take as directed and contact our office if you believe you are having problem(s) with the medication(s).    Please followup in 6 months

## 2017-10-07 NOTE — Assessment & Plan Note (Signed)
Not as well controlled on Cymbalta as it was on citalopram Will increase Cymbalta to 60 mg daily and hopefully that will help Uses Xanax only as needed Continue regular exercise

## 2017-10-07 NOTE — Assessment & Plan Note (Signed)
Controlled, stable Continue current dose of medication  

## 2017-10-07 NOTE — Assessment & Plan Note (Signed)
Pain improved with Cymbalta at 30 mg daily Will increase to 60 mg daily and consider increasing further in the future Continue regular exercise

## 2017-10-27 ENCOUNTER — Ambulatory Visit: Payer: BLUE CROSS/BLUE SHIELD | Admitting: Internal Medicine

## 2017-10-27 DIAGNOSIS — Z0289 Encounter for other administrative examinations: Secondary | ICD-10-CM

## 2017-11-24 ENCOUNTER — Encounter: Payer: Self-pay | Admitting: Internal Medicine

## 2017-11-25 ENCOUNTER — Other Ambulatory Visit: Payer: Self-pay

## 2017-11-25 ENCOUNTER — Ambulatory Visit: Payer: Self-pay | Admitting: Hematology

## 2017-11-27 ENCOUNTER — Other Ambulatory Visit: Payer: Self-pay | Admitting: Internal Medicine

## 2017-11-29 MED ORDER — GABAPENTIN 100 MG PO CAPS
100.0000 mg | ORAL_CAPSULE | Freq: Every day | ORAL | 3 refills | Status: DC
Start: 2017-11-29 — End: 2018-01-18

## 2017-11-30 NOTE — Telephone Encounter (Signed)
[place] Controlled Substance Database checked. Last filled on 08/06/17

## 2017-12-02 NOTE — Progress Notes (Signed)
[name]    HEMATOLOGY/ONCOLOGY CLINIC NOTE  Date of Service: 12/08/17    Patient Care Team: Binnie Rail, MD as PCP - General (Internal Medicine) Lisa Salina, MD as Consulting Physician (Obstetrics and Gynecology) Harold Hedge, Darrick Grinder, MD (Allergy and Immunology) Philemon Kingdom, MD (Endocrinology) Dennard Nip, NP as Nurse Practitioner (Psychiatry) Calvert Cantor, MD (Ophthalmology) Pieter Partridge, DO (Neurology)  CHIEF COMPLAINTS/PURPOSE OF CONSULTATION:  F/u for iron deficiency anemia and thrombocytosis  HISTORY OF PRESENTING ILLNESS:   Lisa Duran is a wonderful 37 y.o. female who has been referred to [place] by Dr .Quay Burow, Lisa Lick, MD for evaluation and management of evaluation of Anemia and thrombocytosis.  Patient has h/o fibromyalga, significant eczema, possible IBS (self decided to pursue gluten free diet), DM2, ADHD, anxiety, obesity had labs with her PCP On 02/03/2017 and was noted to have anemia with a hgb of 10.8 with MCV of 66.3 with elevated platelets of 573k with borderline leucocytosis of 12.7 with  Eosinophilia of 1.3k.  Hgb 1  Yr ago was 11.6 with MCV of 74.6 and platelets of 538k  She has had significantly elevated inflammatory markers (sed rate 112) and has been in the process of having a rheumatology w/u with Lisa Pound MD.  No fatigue but no evident LNadenopathy, splenomegaly . No fevers/chills. Notes that she has had a Mirena IUD and notes no significant menstrual losses. No other overt bleeding. Has been on chronic PPI therapy. No overt weight loss or other focal symptoms suggestive of malignancy. Patient has previosuly had EGD in 09/2015 that showed mild pan gastritis and HH without cameron ulcers.   INTERVAL HISTORY   [name] is here for f/u of her iron deficiency anemia. She was last seen by me 6 months ago. She notes her energy has improved but not adequate for her. She still has her IUD but has frequent smaller periods since  getting IV iron. She has not gotten GI workup since last visit. She had Upper endoscopy 2 years ago and was normal. She notes she is still taking acid suppressants. She notes she has chicken allergy so she does not eat much meat. She takes Ferrex 150mg  twice a week because it upsets her stomach. She started Gabapentin and changed Celexa to Cymbalta to help her fibromyalgia. She is allergic to chicken so she does not eat much meat in her diet. She is on a gluten free diet as this effected her stomach.    On review of symptoms, pt notes resent flare of eczema. She is watching her diet and uses steroid creme. She notes fatigue still, but the energy has improved.    MEDICAL HISTORY:  Past Medical History:  Diagnosis Date  . ADHD (attention deficit hyperactivity disorder)   . ALLERGIC RHINITIS   . Allergy   . Anemia   . ANXIETY   . Depression   . DIABETES MELLITUS, TYPE II   . Fibromyalgia 2018  . GERD (gastroesophageal reflux disease)   . MIGRAINE, COMMON   . Obesity, unspecified   . OVARIAN CYST     SURGICAL HISTORY: Past Surgical History:  Procedure Laterality Date  . NO PAST SURGERIES      SOCIAL HISTORY: Social History   Socioeconomic History  . Marital status: Single    Spouse name: Not on file  . Number of children: Not on file  . Years of education: Not on file  . Highest education level: Not on file  Occupational History  . Not on file  Social Needs  . Financial resource strain: Not on file  . Food insecurity:    Worry: Not on file    Inability: Not on file  . Transportation needs:    Medical: Not on file    Non-medical: Not on file  Tobacco Use  . Smoking status: Former Smoker    Last attempt to quit: 08/20/2002    Years since quitting: 15.3  . Smokeless tobacco: Never Used  . Tobacco comment: single, separated from spouse 02/2010.   Substance and Sexual Activity  . Alcohol use: Yes    Comment: rarely  . Drug use: No  . Sexual activity: Not Currently     Birth control/protection: IUD  Lifestyle  . Physical activity:    Days per week: Not on file    Minutes per session: Not on file  . Stress: Not on file  Relationships  . Social connections:    Talks on phone: Not on file    Gets together: Not on file    Attends religious service: Not on file    Active member of club or organization: Not on file    Attends meetings of clubs or organizations: Not on file    Relationship status: Not on file  . Intimate partner violence:    Fear of current or ex partner: Not on file    Emotionally abused: Not on file    Physically abused: Not on file    Forced sexual activity: Not on file  Other Topics Concern  . Not on file  Social History Narrative   Single, separated from spouse 02/2010    FAMILY HISTORY: Family History  Problem Relation Age of Onset  . Diabetes Mother   . Hyperlipidemia Mother   . Diabetes Father   . Hyperlipidemia Father   . Coronary artery disease Father   . Hypertension Father   . Cancer Father        esoph  . Stroke Father   . Cancer Maternal Grandfather 73       Prostate and Lung  . Diabetes Sister        Gestational  . Lisa Duran' disease Maternal Aunt   . Cancer Maternal Uncle        Brain  . Stroke Paternal Grandmother   . Heart disease Paternal Grandmother   . Stroke Paternal Grandfather   . Heart disease Paternal Grandfather   . Diabetes Sister        Gestational and Type II  . Hashimoto's thyroiditis Cousin   . Cancer Maternal Uncle        Brain    ALLERGIES:  is allergic to propofol; chicken allergy; gluten meal; other; and victoza [liraglutide].  MEDICATIONS:  Current Outpatient Medications  Medication Sig Dispense Refill  . almotriptan (AXERT) 12.5 MG tablet Take 1 tablet (12.5 mg total) by mouth as needed for migraine. may repeat in 2 hours if needed 12 tablet 3  . ALPRAZolam (XANAX) 0.5 MG tablet TAKE 1 TABLET(0.5 MG) BY MOUTH THREE TIMES DAILY AS NEEDED FOR ANXIETY 30 tablet 0  .  amphetamine-dextroamphetamine (ADDERALL XR) 20 MG 24 hr capsule Take 1 capsule (20 mg total) by mouth daily. 30 capsule 0  . BD PEN NEEDLE NANO U/F 32G X 4 MM MISC USE ONCE A DAY 90 each 3  . Cyanocobalamin (B-12) 1000 MCG SUBL Place 1,000 mcg under the tongue daily. 30 each 3  . DULoxetine (CYMBALTA) 60 MG capsule Take 1 capsule (60 mg total) by mouth daily. Rudy  capsule 3  . fluticasone (FLONASE) 50 MCG/ACT nasal spray Place 2 sprays into both nostrils daily. 16 g 12  . gabapentin (NEURONTIN) 100 MG capsule Take 1 capsule (100 mg total) by mouth at bedtime. Increase to 200 mg if tolerated. 30 capsule 3  . glipiZIDE (GLUCOTROL XL) 5 MG 24 hr tablet Take 1 tablet (5 mg total) by mouth daily with breakfast. 90 tablet 3  . glucose blood (ONETOUCH VERIO) test strip Use 2x a day 100 each 12  . Insulin Glargine (LANTUS SOLOSTAR) 100 UNIT/ML Solostar Pen Inject 32 units at bedtime. 15 pen 1  . Insulin Pen Needle 32G X 4 MM MISC Use 1x a day 100 each 3  . iron polysaccharides (NIFEREX) 150 MG capsule Take 1 capsule (150 mg total) by mouth daily. 30 capsule 5  . JANUVIA 100 MG tablet TAKE 1 TABLET BY MOUTH  DAILY 90 tablet 1  . lamoTRIgine (LAMICTAL) 100 MG tablet TAKE 1 TABLET BY MOUTH TWO  TIMES DAILY 180 tablet 1  . levonorgestrel (MIRENA) 20 MCG/24HR IUD 1 Intra Uterine Device (1 each total) by Intrauterine route once. 1 each 0  . metFORMIN (GLUCOPHAGE) 1000 MG tablet TAKE ONE-HALF TABLET BY  MOUTH TWICE A DAY WITH A  MEAL 90 tablet 1  . metoprolol succinate (TOPROL-XL) 25 MG 24 hr tablet TAKE ONE-HALF TABLET BY  MOUTH DAILY 45 tablet 1  . omeprazole (PRILOSEC) 40 MG capsule TAKE 1 CAPSULE BY MOUTH  DAILY BEFORE SUPPER. 90 capsule 3  . Prenatal Vit-Fe Fumarate-FA (MULTIVITAMIN-PRENATAL) 27-0.8 MG TABS tablet Take 1 tablet by mouth daily at 12 noon.    . promethazine (PHENERGAN) 25 MG tablet Take 25 mg by mouth every 6 (six) hours as needed for nausea.    . traMADol-acetaminophen (ULTRACET) 37.5-325 MG  per tablet Take 1 tablet by mouth every 6 (six) hours as needed for pain.    [name] triamcinolone cream (KENALOG) 0.1 % Apply topically 2 (two) times daily as needed. 30 g 1   No current facility-administered medications for this visit.     REVIEW OF SYSTEMS:    .10 Point review of Systems was done is negative except as noted above.   PHYSICAL EXAMINATION: ECOG PERFORMANCE STATUS: 1 - Symptomatic but completely ambulatory  . Vitals:   12/08/17 1353  BP: 128/81  Pulse: (!) 113  Resp: 18  Temp: 98.7 F (37.1 C)  SpO2: 97%   Filed Weights   12/08/17 1353  Weight: 226 lb 1.6 oz (102.6 kg)   .Body mass index is 38.81 kg/m.  [name] GENERAL:alert, in no acute distress and comfortable SKIN: no acute rashes, no significant lesions EYES: conjunctiva are pink and non-injected, sclera anicteric OROPHARYNX: MMM, no exudates, no oropharyngeal erythema or ulceration NECK: supple, no JVD LYMPH:  no palpable lymphadenopathy in the cervical, axillary or inguinal regions LUNGS: clear to auscultation b/l with normal respiratory effort HEART: regular rate & rhythm ABDOMEN:  normoactive bowel sounds , non tender, not distended. Extremity: no pedal edema PSYCH: alert & oriented x 3 with fluent speech NEURO: no focal motor/sensory deficits   LABORATORY DATA:  I have reviewed the data as listed  . CBC Latest Ref Rng & Units 12/08/2017 05/27/2017 03/13/2017  WBC 3.9 - 10.3 K/uL 12.1(H) 10.2 9.3  Hemoglobin 11.6 - 15.9 g/dL 14.0 13.7 10.3(L)  Hematocrit 34.8 - 46.6 % 43.3 42.0 33.9(L)  Platelets 145 - 400 K/uL 316 361 447(H)   . CBC    Component Value Date/Time  WBC 12.1 (H) 12/08/2017 1334   WBC 10.2 05/27/2017 0838   WBC 12.7 (H) 02/03/2017 1621   RBC 4.97 12/08/2017 1334   RBC 4.97 12/08/2017 1334   HGB 14.0 12/08/2017 1334   HGB 13.7 05/27/2017 0838   HCT 43.3 12/08/2017 1334   HCT 42.0 05/27/2017 0838   PLT 316 12/08/2017 1334   PLT 361 05/27/2017 0838   MCV 87.1 12/08/2017 1334     MCV 79.4 (L) 05/27/2017 0838   MCH 28.2 12/08/2017 1334   MCHC 32.3 12/08/2017 1334   RDW 14.9 (H) 12/08/2017 1334   RDW 30.3 (H) 05/27/2017 0838   LYMPHSABS 2.5 12/08/2017 1334   LYMPHSABS 2.1 05/27/2017 0838   MONOABS 1.0 (H) 12/08/2017 1334   MONOABS 0.8 05/27/2017 0838   EOSABS 0.7 (H) 12/08/2017 1334   EOSABS 0.5 05/27/2017 0838   BASOSABS 0.1 12/08/2017 1334   BASOSABS 0.1 05/27/2017 0838     . CMP Latest Ref Rng & Units 12/08/2017 05/27/2017 03/13/2017  Glucose 70 - 140 mg/dL 79 166(H) 89  BUN 7 - 26 mg/dL 11 8.0 9.0  Creatinine 0.60 - 1.10 mg/dL 0.82 0.8 0.8  Sodium 136 - 145 mmol/L 138 137 138  Potassium 3.5 - 5.1 mmol/L 4.0 4.5 4.1  Chloride 98 - 109 mmol/L 103 - -  CO2 22 - 29 mmol/L 25 22 26   Calcium 8.4 - 10.4 mg/dL 9.9 9.2 9.4  Total Protein 6.4 - 8.3 g/dL 8.0 7.4 7.6  Total Bilirubin 0.2 - 1.2 mg/dL 0.3 0.35 0.26  Alkaline Phos 40 - 150 U/L 151(H) 152(H) 140  AST 5 - 34 U/L 27 17 16   ALT 0 - 55 U/L 31 29 20    Component     Latest Ref Rng & Units 03/13/2017  Iron     41 - 142 ug/dL 16 (L)  TIBC     236 - 444 ug/dL 383  UIBC     120 - 384 ug/dL 367  %SAT     21 - 57 % 4 (L)  Folate, Hemolysate     Not Estab. ng/mL 260.3  HCT     34.0 - 46.6 % 32.7 (L)  Folate, RBC     >498 ng/mL 796  Sed Rate     0 - 32 mm/hr 30  CRP     0.0 - 4.9 mg/L 34.3 (H)  LDH     125 - 245 U/L 144  Ferritin     9 - 269 ng/ml 15  Vitamin B12     232 - 1,245 pg/mL 388   . Lab Results  Component Value Date   IRON 59 05/27/2017   TIBC 275 05/27/2017   IRONPCTSAT 21 05/27/2017   (Iron and TIBC)  Lab Results  Component Value Date   FERRITIN 72 12/08/2017     RADIOGRAPHIC STUDIES: I have personally reviewed the radiological images as listed and agreed with the findings in the report. No results found.  ASSESSMENT & PLAN:   37 yo with    1) Chronic worsening Microcytic Anemia - primarily from Iron deficiency. Noted to have Iron deficiency with ferritin  level of 15 with 4% iron saturation. Sed rate better with significantly elevated CRP levels - still suggest significant inflammation. . Lab Results  Component Value Date   IRON 33 (L) 12/08/2017   TIBC 351 12/08/2017   IRONPCTSAT 9 (L) 12/08/2017   (Iron and TIBC)  Lab Results  Component Value Date   FERRITIN 72 12/08/2017    -  given her significant fatigue and chronic inflammation would plan to target a ferritin level of >100. -she notes that she does not tolerate po iron well, also is on chronic PPI therapy -- so she was offered option for IV Iron and tolerated this well with resolution of Iron deficiency and Anemia -Currently on Ferrex 150mg  2-3 times a week.  -She is allergic to chicken so she does not eat much meat in her diet. She is on a gluten free diet as this effected her stomach.    2) Chronic Thrombocytosis - likely reactive from Iron deficiency and inflammation. Resolved after adequate Iron replacement.  3) Mild leucocytosis - reactive -- obesity, inflammation, Eczema, Adderall etc - WBC at 12.1 today (12/08/17)  4) Eosinophilia - likely from allergies/Eczema -- Eosinophil at 0.7 today (11/28/17)  Plan  -We reviewed her labs, Hg normalized. Her other labs are still pending.  --ferritin levels are lower at 72 -Given she no longer has significant blood loss, she is fine to proceed with GI workup as needed.  -She will continue OTC B12 1011mcg daily and B complex. I recommend liquid form as is may be more palatable. Repeat B12 level with PCP -Continue Oral iron Ferrex 187m p daily for maintenance at this time.. I recommend liquid form as is may be more palatable -f/u with rheumatology for evaluation and management of possible rheumatological condition. -f/uw ith PCP for monitoring of iron def and anemia.   #5. Fibromyalgia  She is currently on gabapentin and Cymbalta to help her fibromyalgia   #6 Eczema  -Uses steroid creme as needed along with watching her diet.      RTC with Dr Irene Limbo based on pending labs from today.  No orders of the defined types were placed in this encounter.  All of the patients questions were answered with apparent satisfaction. The patient knows to call the clinic with any problems, questions or concerns.  . The total time spent in the appointment was 15 minutes and more than 50% was on counseling and direct patient cares.     Sullivan Lone MD [place] AAHIVMS [facility] Hematology/Oncology Physician [facility]  (Office):       [phone] (Work cell):  [phone] (Fax):           [phone]  12/08/2017 2:11 PM   This document serves as a record of services personally performed by Sullivan Lone, MD. It was created on his behalf by Joslyn Devon, a trained medical scribe. The creation of this record is based on the scribe's personal observations and the provider's statements to them.   .I have reviewed the above documentation for accuracy and completeness, and I agree with the above. Brunetta Genera MD

## 2017-12-08 ENCOUNTER — Inpatient Hospital Stay: Payer: 59 | Attending: Hematology

## 2017-12-08 ENCOUNTER — Inpatient Hospital Stay (HOSPITAL_BASED_OUTPATIENT_CLINIC_OR_DEPARTMENT_OTHER): Payer: 59 | Admitting: Hematology

## 2017-12-08 ENCOUNTER — Encounter: Payer: Self-pay | Admitting: Hematology

## 2017-12-08 VITALS — BP 128/81 | HR 113 | Temp 98.7°F | Resp 18 | Ht 64.0 in | Wt 226.1 lb

## 2017-12-08 DIAGNOSIS — D696 Thrombocytopenia, unspecified: Secondary | ICD-10-CM

## 2017-12-08 DIAGNOSIS — Z800 Family history of malignant neoplasm of digestive organs: Secondary | ICD-10-CM | POA: Insufficient documentation

## 2017-12-08 DIAGNOSIS — K219 Gastro-esophageal reflux disease without esophagitis: Secondary | ICD-10-CM | POA: Diagnosis not present

## 2017-12-08 DIAGNOSIS — Z794 Long term (current) use of insulin: Secondary | ICD-10-CM

## 2017-12-08 DIAGNOSIS — R69 Illness, unspecified: Secondary | ICD-10-CM | POA: Diagnosis not present

## 2017-12-08 DIAGNOSIS — F418 Other specified anxiety disorders: Secondary | ICD-10-CM

## 2017-12-08 DIAGNOSIS — M797 Fibromyalgia: Secondary | ICD-10-CM | POA: Insufficient documentation

## 2017-12-08 DIAGNOSIS — K589 Irritable bowel syndrome without diarrhea: Secondary | ICD-10-CM | POA: Diagnosis not present

## 2017-12-08 DIAGNOSIS — F909 Attention-deficit hyperactivity disorder, unspecified type: Secondary | ICD-10-CM | POA: Insufficient documentation

## 2017-12-08 DIAGNOSIS — Z87891 Personal history of nicotine dependence: Secondary | ICD-10-CM | POA: Diagnosis not present

## 2017-12-08 DIAGNOSIS — Z79899 Other long term (current) drug therapy: Secondary | ICD-10-CM | POA: Diagnosis not present

## 2017-12-08 DIAGNOSIS — D508 Other iron deficiency anemias: Secondary | ICD-10-CM

## 2017-12-08 DIAGNOSIS — E669 Obesity, unspecified: Secondary | ICD-10-CM

## 2017-12-08 DIAGNOSIS — E538 Deficiency of other specified B group vitamins: Secondary | ICD-10-CM

## 2017-12-08 DIAGNOSIS — D509 Iron deficiency anemia, unspecified: Secondary | ICD-10-CM | POA: Diagnosis not present

## 2017-12-08 DIAGNOSIS — E119 Type 2 diabetes mellitus without complications: Secondary | ICD-10-CM | POA: Insufficient documentation

## 2017-12-08 LAB — COMPREHENSIVE METABOLIC PANEL
ALK PHOS: 151 U/L — AB (ref 40–150)
ALT: 31 U/L (ref 0–55)
AST: 27 U/L (ref 5–34)
Albumin: 4.1 g/dL (ref 3.5–5.0)
Anion gap: 10 (ref 3–11)
BUN: 11 mg/dL (ref 7–26)
CALCIUM: 9.9 mg/dL (ref 8.4–10.4)
CO2: 25 mmol/L (ref 22–29)
CREATININE: 0.82 mg/dL (ref 0.60–1.10)
Chloride: 103 mmol/L (ref 98–109)
Glucose, Bld: 79 mg/dL (ref 70–140)
Potassium: 4 mmol/L (ref 3.5–5.1)
Sodium: 138 mmol/L (ref 136–145)
Total Bilirubin: 0.3 mg/dL (ref 0.2–1.2)
Total Protein: 8 g/dL (ref 6.4–8.3)

## 2017-12-08 LAB — FERRITIN: FERRITIN: 72 ng/mL (ref 9–269)

## 2017-12-08 LAB — CBC WITH DIFFERENTIAL (CANCER CENTER ONLY)
BASOS PCT: 1 %
Basophils Absolute: 0.1 10*3/uL (ref 0.0–0.1)
Eosinophils Absolute: 0.7 10*3/uL — ABNORMAL HIGH (ref 0.0–0.5)
Eosinophils Relative: 6 %
HEMATOCRIT: 43.3 % (ref 34.8–46.6)
Hemoglobin: 14 g/dL (ref 11.6–15.9)
LYMPHS ABS: 2.5 10*3/uL (ref 0.9–3.3)
Lymphocytes Relative: 21 %
MCH: 28.2 pg (ref 25.1–34.0)
MCHC: 32.3 g/dL (ref 31.5–36.0)
MCV: 87.1 fL (ref 79.5–101.0)
MONOS PCT: 8 %
Monocytes Absolute: 1 10*3/uL — ABNORMAL HIGH (ref 0.1–0.9)
NEUTROS ABS: 7.7 10*3/uL — AB (ref 1.5–6.5)
Neutrophils Relative %: 64 %
Platelet Count: 316 10*3/uL (ref 145–400)
RBC: 4.97 MIL/uL (ref 3.70–5.45)
RDW: 14.9 % — AB (ref 11.2–14.5)
WBC Count: 12.1 10*3/uL — ABNORMAL HIGH (ref 3.9–10.3)

## 2017-12-08 LAB — RETICULOCYTES
RBC.: 4.97 MIL/uL (ref 3.70–5.45)
Retic Count, Absolute: 59.6 10*3/uL (ref 33.7–90.7)
Retic Ct Pct: 1.2 % (ref 0.7–2.1)

## 2017-12-08 LAB — IRON AND TIBC
IRON: 33 ug/dL — AB (ref 41–142)
Saturation Ratios: 9 % — ABNORMAL LOW (ref 21–57)
TIBC: 351 ug/dL (ref 236–444)
UIBC: 318 ug/dL

## 2017-12-08 LAB — VITAMIN B12: Vitamin B-12: 531 pg/mL (ref 180–914)

## 2017-12-09 ENCOUNTER — Telehealth: Payer: Self-pay

## 2017-12-09 NOTE — Telephone Encounter (Signed)
Per 4/30 no los (returned visit based on lab result)

## 2017-12-14 ENCOUNTER — Encounter: Payer: Self-pay | Admitting: Internal Medicine

## 2017-12-14 ENCOUNTER — Encounter: Payer: Self-pay | Admitting: Hematology

## 2017-12-14 NOTE — Telephone Encounter (Signed)
[place] Controlled Substance Database checked. Last filled on 10/07/17

## 2017-12-15 MED ORDER — AMPHETAMINE-DEXTROAMPHET ER 20 MG PO CP24: 20.0000 mg | ORAL_CAPSULE | Freq: Every day | ORAL | 0 refills | Status: DC

## 2018-01-01 ENCOUNTER — Ambulatory Visit: Payer: Self-pay

## 2018-01-01 ENCOUNTER — Encounter: Payer: Self-pay | Admitting: Family Medicine

## 2018-01-01 ENCOUNTER — Ambulatory Visit: Payer: 59 | Admitting: Family Medicine

## 2018-01-01 VITALS — BP 130/80 | HR 143 | Ht 64.0 in | Wt 227.0 lb

## 2018-01-01 DIAGNOSIS — M25571 Pain in right ankle and joints of right foot: Secondary | ICD-10-CM

## 2018-01-01 DIAGNOSIS — S86011A Strain of right Achilles tendon, initial encounter: Secondary | ICD-10-CM

## 2018-01-01 NOTE — Progress Notes (Signed)
[facility] [address] [place], [place] [address] Phone: [phone] Subjective:    I'm seeing this patient by the request  of:    CC: Ankle pain  FTD:[id number]  Lisa Duran is a 37 y.o. female coming in with complaint of right ankle pain. Was going up the stairs when she fell. Says she lost her footing as she was going up. Caught her self on her right foot. States it went numb posterior. Kept her up last night. Pain was so bad she threw up. Has it ace wrapped. A little swelling. Loss of ROM. Weight bearing is painful. Calf is sore. Doesn't feel like her ankle invert or evert. States her toes feel tingly.    Onset- Acute; last night Location- Posterior, lateral Duration-  Character- "hot coal lava on the back of my heel" Aggravating factors- Walking Reliving factors- Elevated, Ice (x2) Therapies tried-  Severity-  kana thompson acted as a Education administrator and agree with the above information verified by Lyndal Pulley      Past Medical History:  Diagnosis Date  . ADHD (attention deficit hyperactivity disorder)   . ALLERGIC RHINITIS   . Allergy   . Anemia   . ANXIETY   . Depression   . DIABETES MELLITUS, TYPE II   . Fibromyalgia 2018  . GERD (gastroesophageal reflux disease)   . MIGRAINE, COMMON   . Obesity, unspecified   . OVARIAN CYST    Past Surgical History:  Procedure Laterality Date  . NO PAST SURGERIES     Social History   Socioeconomic History  . Marital status: Single    Spouse name: Not on file  . Number of children: Not on file  . Years of education: Not on file  . Highest education level: Not on file  Occupational History  . Not on file  Social Needs  . Financial resource strain: Not on file  . Food insecurity:    Worry: Not on file    Inability: Not on file  . Transportation needs:    Medical: Not on file    Non-medical: Not on file  Tobacco Use  . Smoking status: Former Smoker    Last attempt to quit: 08/20/2002   Years since quitting: 15.3  . Smokeless tobacco: Never Used  . Tobacco comment: single, separated from spouse 02/2010.   Substance and Sexual Activity  . Alcohol use: Yes    Comment: rarely  . Drug use: No  . Sexual activity: Not Currently    Birth control/protection: IUD  Lifestyle  . Physical activity:    Days per week: Not on file    Minutes per session: Not on file  . Stress: Not on file  Relationships  . Social connections:    Talks on phone: Not on file    Gets together: Not on file    Attends religious service: Not on file    Active member of club or organization: Not on file    Attends meetings of clubs or organizations: Not on file    Relationship status: Not on file  Other Topics Concern  . Not on file  Social History Narrative   Single, separated from spouse 02/2010   Allergies  Allergen Reactions  . Propofol Shortness Of Breath    SOB, chest tightness, wheezing after colonoscopy on 09-18-15  . Chicken Allergy   . Gluten Meal Diarrhea  . Other     Any Nuts   . Victoza [Liraglutide] Rash  Family History  Problem Relation Age of Onset  . Diabetes Mother   . Hyperlipidemia Mother   . Diabetes Father   . Hyperlipidemia Father   . Coronary artery disease Father   . Hypertension Father   . Cancer Father        esoph  . Stroke Father   . Cancer Maternal Grandfather 67       Prostate and Lung  . Diabetes Sister        Gestational  . Berenice Primas' disease Maternal Aunt   . Cancer Maternal Uncle        Brain  . Stroke Paternal Grandmother   . Heart disease Paternal Grandmother   . Stroke Paternal Grandfather   . Heart disease Paternal Grandfather   . Diabetes Sister        Gestational and Type II  . Hashimoto's thyroiditis Cousin   . Cancer Maternal Uncle        Brain     Past medical history, social, surgical and family history all reviewed in electronic medical record.  No pertanent information unless stated regarding to the chief complaint.   Review of  Systems:Review of systems updated and as accurate as of 01/01/18  No headache, visual changes, nausea, vomiting, diarrhea, constipation, dizziness, abdominal pain, skin rash, fevers, chills, night sweats, weight loss, swollen lymph nodes, body aches, joint swelling, muscle aches, chest pain, shortness of breath, mood changes.   Objective  Blood pressure 130/80, pulse (!) 143, height 5\' 4"  (1.626 m), weight 227 lb (103 kg), SpO2 96 %. Systems examined below as of 01/01/18   General: No apparent distress alert and oriented x3 mood and affect normal, dressed appropriately.  HEENT: Pupils equal, extraocular movements intact  Respiratory: Patient's speak in full sentences and does not appear short of breath  Cardiovascular: No lower extremity edema, non tender, no erythema  Skin: Warm dry intact with no signs of infection or rash on extremities or on axial skeleton.  Abdomen: Soft nontender  Neuro: Cranial nerves II through XII are intact, neurovascularly intact in all extremities with 2+ DTRs and 2+ pulses.  Lymph: No lymphadenopathy of posterior or anterior cervical chain or axillae bilaterally.  Gait antalgic MSK:  Non tender with full range of motion and good stability and symmetric strength and tone of shoulders, elbows, wrist, hip, knee bilaterally.  Ankle: Right No visible erythema or swelling. Range of motion is full in all directions. Strength is 5/5 in all directions. Stable lateral and medial ligaments; squeeze test and kleiger test unremarkable; Talar dome nontender; No pain at base of 5th MT; No tenderness over cuboid; No tenderness over N spot or navicular prominence No tenderness on posterior aspects of lateral and medial malleolus No sign of peroneal tendon subluxations or tenderness to palpation Achilles severely tender to palpation.  Patient has a negative Thompson. Negative tarsal tunnel tinel's Able to walk 4 steps but antalgic  MSK US performed of: Right ankle This  study was ordered, performed, and interpreted by Charlann Boxer D.O.  Foot/Ankle:   Imaging ultrasound of patient's Achilles area shows no significant increase in Doppler.  Patient not does have significant discomfort in the area.   IMPRESSION: Patient does have what appears to be a very mild strain at the t musculotendinous juncture of the right calf and Achilles   Impression and Recommendations:     This case required medical decision making of moderate complexity.      Note: This dictation was prepared with Dragon dictation  along with smaller phrase technology. Any transcriptional errors that result from this process are unintentional.

## 2018-01-01 NOTE — Assessment & Plan Note (Signed)
Strain of the acuity seems to be noted.  Put in a cam walker due to patient being in a small increased risk of potential rupture at this time.  Discussed topical anti-inflammatories, icing regimen as well as coming out to do range of motion exercises when doing certain activities.  Patient differential would include a potential deep venous thrombosis and we discussed the possibility of Doppler but with this being an injury likely not necessary.  Patient declined it.  We discussed compression is a possibility.  Follow-up again in 2 weeks.

## 2018-01-01 NOTE — Patient Instructions (Signed)
Good to see you  I am sorry for the bad news When walking wear the boot next 2 weeks Ice 20 minutes 2 times daily. Usually after activity and before bed. Elevate the foot above heart when you can  OK to come out of the boot when sitting and do not drive in it See me again in 2 weeks

## 2018-01-13 NOTE — Progress Notes (Signed)
[facility] [address] [place], [place] [address] Phone: [phone] Subjective:       CC: Ankle pain follow-up  GLO:[id number]  Lisa Duran is a 37 y.o. female coming in with complaint of ankle pain. Her pain has decreased and is now feeling a tightness in her calf. Slight pain in the tight area as well. She has been taking her foot out of the boot and moving it around.  Found to have a musculotendinous strain of the right calf.  90% better.     Past Medical History:  Diagnosis Date  . ADHD (attention deficit hyperactivity disorder)   . ALLERGIC RHINITIS   . Allergy   . Anemia   . ANXIETY   . Depression   . DIABETES MELLITUS, TYPE II   . Fibromyalgia 2018  . GERD (gastroesophageal reflux disease)   . MIGRAINE, COMMON   . Obesity, unspecified   . OVARIAN CYST    Past Surgical History:  Procedure Laterality Date  . NO PAST SURGERIES     Social History   Socioeconomic History  . Marital status: Single    Spouse name: Not on file  . Number of children: Not on file  . Years of education: Not on file  . Highest education level: Not on file  Occupational History  . Not on file  Social Needs  . Financial resource strain: Not on file  . Food insecurity:    Worry: Not on file    Inability: Not on file  . Transportation needs:    Medical: Not on file    Non-medical: Not on file  Tobacco Use  . Smoking status: Former Smoker    Last attempt to quit: 08/20/2002    Years since quitting: 15.4  . Smokeless tobacco: Never Used  . Tobacco comment: single, separated from spouse 02/2010.   Substance and Sexual Activity  . Alcohol use: Yes    Comment: rarely  . Drug use: No  . Sexual activity: Not Currently    Birth control/protection: IUD  Lifestyle  . Physical activity:    Days per week: Not on file    Minutes per session: Not on file  . Stress: Not on file  Relationships  . Social connections:    Talks on phone: Not on file    Gets  together: Not on file    Attends religious service: Not on file    Active member of club or organization: Not on file    Attends meetings of clubs or organizations: Not on file    Relationship status: Not on file  Other Topics Concern  . Not on file  Social History Narrative   Single, separated from spouse 02/2010   Allergies  Allergen Reactions  . Propofol Shortness Of Breath    SOB, chest tightness, wheezing after colonoscopy on 09-18-15  . Chicken Allergy   . Gluten Meal Diarrhea  . Other     Any Nuts   . Victoza [Liraglutide] Rash   Family History  Problem Relation Age of Onset  . Diabetes Mother   . Hyperlipidemia Mother   . Diabetes Father   . Hyperlipidemia Father   . Coronary artery disease Father   . Hypertension Father   . Cancer Father        esoph  . Stroke Father   . Cancer Maternal Grandfather 73       Prostate and Lung  . Diabetes Sister  Gestational  . Graves' disease Maternal Aunt   . Cancer Maternal Uncle        Brain  . Stroke Paternal Grandmother   . Heart disease Paternal Grandmother   . Stroke Paternal Grandfather   . Heart disease Paternal Grandfather   . Diabetes Sister        Gestational and Type II  . Hashimoto's thyroiditis Cousin   . Cancer Maternal Uncle        Brain     Past medical history, social, surgical and family history all reviewed in electronic medical record.  No pertanent information unless stated regarding to the chief complaint.   Review of Systems:Review of systems updated and as accurate as of 01/15/18  No headache, visual changes, nausea, vomiting, diarrhea, constipation, dizziness, abdominal pain, skin rash, fevers, chills, night sweats, weight loss, swollen lymph nodes, body aches, joint swelling, muscle aches, chest pain, shortness of breath, mood changes.   Objective  Blood pressure 110/88, pulse 100, height 5\' 4"  (1.626 m), weight 225 lb (102.1 kg), SpO2 97 %. Systems examined below as of 01/15/18     General: No apparent distress alert and oriented x3 mood and affect normal, dressed appropriately.  HEENT: Pupils equal, extraocular movements intact  Respiratory: Patient's speak in full sentences and does not appear short of breath  Cardiovascular: No lower extremity edema, non tender, no erythema  Skin: Warm dry intact with no signs of infection or rash on extremities or on axial skeleton.  Abdomen: Soft nontender  Neuro: Cranial nerves II through XII are intact, neurovascularly intact in all extremities with 2+ DTRs and 2+ pulses.  Lymph: No lymphadenopathy of posterior or anterior cervical chain or axillae bilaterally.  Gait normal with good balance and coordination.  MSK:  Non tender with full range of motion and good stability and symmetric strength and tone of shoulders, elbows, wrist, hip, knee bilaterally.  Ankle: Right No visible erythema or swelling. Range of motion is full in all directions. Strength is 5/5 in all directions. Stable lateral and medial ligaments; squeeze test and kleiger test unremarkable; Talar dome nontender; No pain at base of 5th MT; No tenderness over cuboid; No tenderness over N spot or navicular prominence No tenderness on posterior aspects of lateral and medial malleolus No sign of peroneal tendon subluxations or tenderness to palpation very mild discomfort noted in the calf still noted to palpation. Negative tarsal tunnel tinel's Able to walk 4 steps.    Impression and Recommendations:     This case required medical decision making of moderate complexity.      Note: This dictation was prepared with Dragon dictation along with smaller phrase technology. Any transcriptional errors that result from this process are unintentional.

## 2018-01-15 ENCOUNTER — Encounter: Payer: Self-pay | Admitting: Family Medicine

## 2018-01-15 ENCOUNTER — Ambulatory Visit: Payer: 59 | Admitting: Family Medicine

## 2018-01-15 DIAGNOSIS — S86011A Strain of right Achilles tendon, initial encounter: Secondary | ICD-10-CM | POA: Diagnosis not present

## 2018-01-15 NOTE — Patient Instructions (Signed)
Good to see you  Ice is your friend Calf compression sleeve  Heel lift in tennis shoes Exercises 3 times a week. Better to start with biking, elliptical, swimming   See me again in 4 weeks if not perfect

## 2018-01-15 NOTE — Assessment & Plan Note (Signed)
Appears to be doing well.  Home exercises given, icing regimen given.  Discussed which activities of doing which wants to avoid.  Patient is to increase activity as tolerated.  Patient will follow-up with me again 4 weeks.

## 2018-01-18 ENCOUNTER — Encounter: Payer: Self-pay | Admitting: Internal Medicine

## 2018-01-18 ENCOUNTER — Other Ambulatory Visit: Payer: Self-pay | Admitting: Internal Medicine

## 2018-01-18 MED ORDER — AMPHETAMINE-DEXTROAMPHET ER 20 MG PO CP24: 20.0000 mg | ORAL_CAPSULE | Freq: Every day | ORAL | 0 refills | Status: DC

## 2018-01-18 MED ORDER — GABAPENTIN 100 MG PO CAPS: 200.0000 mg | ORAL_CAPSULE | Freq: Every day | ORAL | 1 refills | Status: DC

## 2018-01-18 NOTE — Telephone Encounter (Signed)
Is this okay to refill? 

## 2018-01-18 NOTE — Telephone Encounter (Signed)
No, pt was lost for f/u.

## 2018-01-21 ENCOUNTER — Other Ambulatory Visit: Payer: Self-pay | Admitting: Internal Medicine

## 2018-01-21 NOTE — Telephone Encounter (Signed)
Pending appt on 01/21/18

## 2018-01-22 ENCOUNTER — Ambulatory Visit: Payer: 59 | Admitting: Internal Medicine

## 2018-01-22 ENCOUNTER — Encounter: Payer: Self-pay | Admitting: Internal Medicine

## 2018-01-22 VITALS — BP 118/88 | HR 86 | Ht 64.0 in | Wt 225.2 lb

## 2018-01-22 DIAGNOSIS — E1149 Type 2 diabetes mellitus with other diabetic neurological complication: Secondary | ICD-10-CM

## 2018-01-22 DIAGNOSIS — Z6838 Body mass index (BMI) 38.0-38.9, adult: Secondary | ICD-10-CM

## 2018-01-22 LAB — POCT GLYCOSYLATED HEMOGLOBIN (HGB A1C): Hemoglobin A1C: 9.4 % — AB (ref 4.0–5.6)

## 2018-01-22 MED ORDER — GLUCOSE BLOOD VI STRP: ORAL_STRIP | 3 refills | Status: DC

## 2018-01-22 MED ORDER — METFORMIN HCL 1000 MG PO TABS: ORAL_TABLET | ORAL | 3 refills | Status: DC

## 2018-01-22 MED ORDER — INSULIN PEN NEEDLE 32G X 4 MM MISC: 3 refills | Status: DC

## 2018-01-22 MED ORDER — GLIPIZIDE ER 5 MG PO TB24: 5.0000 mg | ORAL_TABLET | Freq: Every day | ORAL | 3 refills | Status: DC

## 2018-01-22 MED ORDER — SITAGLIPTIN PHOSPHATE 100 MG PO TABS: 100.0000 mg | ORAL_TABLET | Freq: Every day | ORAL | 3 refills | Status: DC

## 2018-01-22 NOTE — Patient Instructions (Addendum)
Please continue: - Lantus 32 units at bedtime - Metformin 1000 mg 2x a day - Januvia 100 mg in am - Glipizide ER 5 mg daily in am, before b'fast  Start checking sugars 1x a day.  Please return in 4 months with your sugar log.

## 2018-01-22 NOTE — Progress Notes (Signed)
Subjective:     Patient ID: Lisa Duran, female   DOB: [birth year], 37 y.o.   MRN: [id number]  HPI [name] is a pleasant 37 y.o. woman returning for f/u for DM2, insulin dependent, uncontrolled, dx 04/3817, w/o complications. Last visit 8 months ago.  She is not usually compliant with appointments.  Since last visit, she went through a hard time, during which she developed depression after he changed her antidepressant from Celexa, on which she was on for many years, to Cymbalta.  This change was done to help with her fibromyalgia.  She forgot her diabetes medicines during this period and also did not check sugars. She is now starting to feel better.  She is still anemic. Getting qod iron and occasional iv iron. Also B12 tablets 1000 mcg qod.  Last HbA1c: Lab Results  Component Value Date   HGBA1C 6.2 05/25/2017   HGBA1C 7.6 12/25/2016   HGBA1C 9.5 08/19/2016   She is on: (missed doses) - Lantus 32 units at bedtime - Metformin 1000 mg 2x a day - Januvia 100 mg in am - was off for 2 weeks in 10/2017 - Glipizide ER 5 mg daily in am, before b'fast - was off for 2 weeks in 10/2017  We tried Cycloset 0.8 mg daily >> stopped end of 11/2016 b/c GERD/AP/C We tried Trulicity >> could not tolerate it: N/V/AP. She restarted Januvia 09/2016. She was on Invokana 100 mg in am (started 12/2014) >> 2 yeast inf >> tx with Diflucan; she was exhausted and had nocturia >> stopped 03/12/2015 Tried Victoza 2 mo ago >> AP, severe GERD, inj site rxn Tried Januvia >> tolerated it well, but stopped when Invokana was suggested.  She was also on Actos, but taken off b/c good control.  She has a [employer], structural.  She checks sugars seldom -much higher than before: - am:138, 199 >> 78-161 (most 90-115) >> 207-275 - before lunch:182 >> 117, 183 (no metf.) >> 74-126 >> n/c - 2h after lunch: 149-164 >> 84-172 (most 128-141) >> 222, 247 - before dinner:  125, 155 >>  80-137 >> n/c - 2h post dinner:  141 >>  n/c >> 119-207 (most 140-156) >> n/c - b/t: 159-190 >> n/c She has hypoglycemia awareness in the 80s.  No CKD. Lab Results  Component Value Date   BUN 11 12/08/2017   Lab Results  Component Value Date   CREATININE 0.82 12/08/2017   - No latest lipid panel: Lab Results  Component Value Date   CHOL 161 02/03/2017   HDL 58.60 02/03/2017   LDLCALC 80 02/03/2017   TRIG 113.0 02/03/2017   CHOLHDL 3 02/03/2017   Last eye exam 10/2016: No DR [facility]. Has one scheduled. + numbness and tingling in hands and feet.  She had CP >> had a stress test 07/04/2015 >>  normal.   On metoprolol to control her heart rate. Last year she was diagnosed with fibromyalgia.  On Cymbalta, Neurontin. She also has a history of iron deficiency anemia and had iron infusions. Before last visit, she just got out of a 5-year relationship.  She was under a lot of stress.   Past Medical History:  Diagnosis Date  . ADHD (attention deficit hyperactivity disorder)   . ALLERGIC RHINITIS   . Allergy   . Anemia   . ANXIETY   . Depression   . DIABETES MELLITUS, TYPE II   . Fibromyalgia 2018  . GERD (gastroesophageal reflux  disease)   . MIGRAINE, COMMON   . Obesity, unspecified   . OVARIAN CYST    Past Surgical History:  Procedure Laterality Date  . NO PAST SURGERIES     History   Social History  . Marital Status: Married    Spouse Name: N/A    Number of Children: 0   Occupational History  . Estate agent   Social History Main Topics  . Smoking status: Was smoking 1/2 PPD, quit 2009  . Smokeless tobacco: No     Comment: single, separated from spouse 02/2010.   [name] Alcohol Use: Yes, mixed, 2-4 x mo, 1-2 drinks at a time  . Drug Use: No   Social History Narrative   Single, separated from spouse 02/2010   Current Outpatient Medications on File Prior to Visit  Medication Sig Dispense Refill  . almotriptan (AXERT) 12.5 MG tablet Take 1 tablet (12.5 mg total) by  mouth as needed for migraine. may repeat in 2 hours if needed 12 tablet 3  . ALPRAZolam (XANAX) 0.5 MG tablet TAKE 1 TABLET(0.5 MG) BY MOUTH THREE TIMES DAILY AS NEEDED FOR ANXIETY 30 tablet 0  . amphetamine-dextroamphetamine (ADDERALL XR) 20 MG 24 hr capsule Take 1 capsule (20 mg total) by mouth daily. 30 capsule 0  . BD PEN NEEDLE NANO U/F 32G X 4 MM MISC USE ONCE A DAY 90 each 3  . Cyanocobalamin (B-12) 1000 MCG SUBL Place 1,000 mcg under the tongue daily. 30 each 3  . DULoxetine (CYMBALTA) 60 MG capsule Take 1 capsule (60 mg total) by mouth daily. 90 capsule 3  . fluticasone (FLONASE) 50 MCG/ACT nasal spray Place 2 sprays into both nostrils daily. 16 g 12  . gabapentin (NEURONTIN) 100 MG capsule Take 2 capsules (200 mg total) by mouth at bedtime. 180 capsule 1  . glipiZIDE (GLUCOTROL XL) 5 MG 24 hr tablet Take 1 tablet (5 mg total) by mouth daily with breakfast. 90 tablet 3  . glucose blood (ONETOUCH VERIO) test strip Use 2x a day 100 each 12  . Insulin Glargine (LANTUS SOLOSTAR) 100 UNIT/ML Solostar Pen Inject 32 units at bedtime. 15 pen 1  . Insulin Pen Needle 32G X 4 MM MISC Use 1x a day 100 each 3  . iron polysaccharides (NIFEREX) 150 MG capsule Take 1 capsule (150 mg total) by mouth daily. 30 capsule 5  . JANUVIA 100 MG tablet TAKE 1 TABLET BY MOUTH  DAILY 90 tablet 1  . lamoTRIgine (LAMICTAL) 100 MG tablet TAKE 1 TABLET BY MOUTH TWO  TIMES DAILY 180 tablet 1  . levonorgestrel (MIRENA) 20 MCG/24HR IUD 1 Intra Uterine Device (1 each total) by Intrauterine route once. 1 each 0  . metFORMIN (GLUCOPHAGE) 1000 MG tablet TAKE ONE-HALF TABLET BY  MOUTH TWICE A DAY WITH A  MEAL 90 tablet 1  . metoprolol succinate (TOPROL-XL) 25 MG 24 hr tablet TAKE ONE-HALF TABLET BY  MOUTH DAILY 45 tablet 1  . omeprazole (PRILOSEC) 40 MG capsule TAKE 1 CAPSULE BY MOUTH  DAILY BEFORE SUPPER. 90 capsule 3  . Prenatal Vit-Fe Fumarate-FA (MULTIVITAMIN-PRENATAL) 27-0.8 MG TABS tablet Take 1 tablet by mouth daily at  12 noon.    . promethazine (PHENERGAN) 25 MG tablet Take 25 mg by mouth every 6 (six) hours as needed for nausea.    . traMADol-acetaminophen (ULTRACET) 37.5-325 MG per tablet Take 1 tablet by mouth every 6 (six) hours as needed for pain.    [name] triamcinolone cream (KENALOG) 0.1 % Apply  topically 2 (two) times daily as needed. 30 g 1   No current facility-administered medications on file prior to visit.    Allergies  Allergen Reactions  . Propofol Shortness Of Breath    SOB, chest tightness, wheezing after colonoscopy on 09-18-15  . Chicken Allergy   . Gluten Meal Diarrhea  . Other     Any Nuts   . Victoza [Liraglutide] Rash    Family History  Problem Relation Age of Onset  . Diabetes Mother   . Hyperlipidemia Mother   . Diabetes Father   . Hyperlipidemia Father   . Coronary artery disease Father   . Hypertension Father   . Cancer Father        esoph  . Stroke Father   . Cancer Maternal Grandfather 29       Prostate and Lung  . Diabetes Sister        Gestational  . Berenice Primas' disease Maternal Aunt   . Cancer Maternal Uncle        Brain  . Stroke Paternal Grandmother   . Heart disease Paternal Grandmother   . Stroke Paternal Grandfather   . Heart disease Paternal Grandfather   . Diabetes Sister        Gestational and Type II  . Hashimoto's thyroiditis Cousin   . Cancer Maternal Uncle        Brain   Review of Systems Constitutional: no weight gain/no weight loss, no fatigue, no subjective hyperthermia, no subjective hypothermia Eyes: no blurry vision, no xerophthalmia ENT: no sore throat, no nodules palpated in throat, no dysphagia, no odynophagia, no hoarseness Cardiovascular: no CP/no SOB/no palpitations/no leg swelling Respiratory: no cough/no SOB/no wheezing Gastrointestinal: no N/no V/no D/no C/no acid reflux Musculoskeletal: no muscle aches/no joint aches Skin: no rashes, no hair loss Neurological: no tremors/+ numbness/+ tingling/no dizziness  I reviewed pt's  medications, allergies, PMH, social hx, family hx, and changes were documented in the history of present illness. Otherwise, unchanged from my initial visit note.  Past Medical History:  Diagnosis Date  . ADHD (attention deficit hyperactivity disorder)   . ALLERGIC RHINITIS   . Allergy   . Anemia   . ANXIETY   . Depression   . DIABETES MELLITUS, TYPE II   . Fibromyalgia 2018  . GERD (gastroesophageal reflux disease)   . MIGRAINE, COMMON   . Obesity, unspecified   . OVARIAN CYST    Past Surgical History:  Procedure Laterality Date  . NO PAST SURGERIES     Social History   Socioeconomic History  . Marital status: Single    Spouse name: Not on file  . Number of children: Not on file  . Years of education: Not on file  . Highest education level: Not on file  Occupational History  . Not on file  Social Needs  . Financial resource strain: Not on file  . Food insecurity:    Worry: Not on file    Inability: Not on file  . Transportation needs:    Medical: Not on file    Non-medical: Not on file  Tobacco Use  . Smoking status: Former Smoker    Last attempt to quit: 08/20/2002    Years since quitting: 15.4  . Smokeless tobacco: Never Used  . Tobacco comment: single, separated from spouse 02/2010.   Substance and Sexual Activity  . Alcohol use: Yes    Comment: rarely  . Drug use: No  . Sexual activity: Not Currently    Birth control/protection:  IUD  Lifestyle  . Physical activity:    Days per week: Not on file    Minutes per session: Not on file  . Stress: Not on file  Relationships  . Social connections:    Talks on phone: Not on file    Gets together: Not on file    Attends religious service: Not on file    Active member of club or organization: Not on file    Attends meetings of clubs or organizations: Not on file    Relationship status: Not on file  . Intimate partner violence:    Fear of current or ex partner: Not on file    Emotionally abused: Not on file     Physically abused: Not on file    Forced sexual activity: Not on file  Other Topics Concern  . Not on file  Social History Narrative   Single, separated from spouse 02/2010   Current Outpatient Medications on File Prior to Visit  Medication Sig Dispense Refill  . almotriptan (AXERT) 12.5 MG tablet Take 1 tablet (12.5 mg total) by mouth as needed for migraine. may repeat in 2 hours if needed 12 tablet 3  . ALPRAZolam (XANAX) 0.5 MG tablet TAKE 1 TABLET(0.5 MG) BY MOUTH THREE TIMES DAILY AS NEEDED FOR ANXIETY 30 tablet 0  . amphetamine-dextroamphetamine (ADDERALL XR) 20 MG 24 hr capsule Take 1 capsule (20 mg total) by mouth daily. 30 capsule 0  . Cyanocobalamin (B-12) 1000 MCG SUBL Place 1,000 mcg under the tongue daily. 30 each 3  . DULoxetine (CYMBALTA) 60 MG capsule Take 1 capsule (60 mg total) by mouth daily. 90 capsule 3  . fluticasone (FLONASE) 50 MCG/ACT nasal spray Place 2 sprays into both nostrils daily. 16 g 12  . gabapentin (NEURONTIN) 100 MG capsule Take 2 capsules (200 mg total) by mouth at bedtime. 180 capsule 1  . glipiZIDE (GLUCOTROL XL) 5 MG 24 hr tablet TAKE 1 TABLET BY MOUTH  DAILY WITH BREAKFAST 90 tablet 3  . Insulin Glargine (LANTUS SOLOSTAR) 100 UNIT/ML Solostar Pen Inject 32 units at bedtime. 15 pen 1  . iron polysaccharides (NIFEREX) 150 MG capsule Take 1 capsule (150 mg total) by mouth daily. 30 capsule 5  . lamoTRIgine (LAMICTAL) 100 MG tablet TAKE 1 TABLET BY MOUTH TWO  TIMES DAILY 180 tablet 1  . levonorgestrel (MIRENA) 20 MCG/24HR IUD 1 Intra Uterine Device (1 each total) by Intrauterine route once. 1 each 0  . metoprolol succinate (TOPROL-XL) 25 MG 24 hr tablet TAKE ONE-HALF TABLET BY  MOUTH DAILY 45 tablet 1  . omeprazole (PRILOSEC) 40 MG capsule TAKE 1 CAPSULE BY MOUTH  DAILY BEFORE SUPPER. 90 capsule 3  . Prenatal Vit-Fe Fumarate-FA (MULTIVITAMIN-PRENATAL) 27-0.8 MG TABS tablet Take 1 tablet by mouth daily at 12 noon.    . promethazine (PHENERGAN) 25 MG tablet  Take 25 mg by mouth every 6 (six) hours as needed for nausea.    . traMADol-acetaminophen (ULTRACET) 37.5-325 MG per tablet Take 1 tablet by mouth every 6 (six) hours as needed for pain.    [name] triamcinolone cream (KENALOG) 0.1 % Apply topically 2 (two) times daily as needed. 30 g 1   No current facility-administered medications on file prior to visit.    Allergies  Allergen Reactions  . Propofol Shortness Of Breath    SOB, chest tightness, wheezing after colonoscopy on 09-18-15  . Chicken Allergy   . Gluten Meal Diarrhea  . Other     Any Nuts   .  Victoza [Liraglutide] Rash   Family History  Problem Relation Age of Onset  . Diabetes Mother   . Hyperlipidemia Mother   . Diabetes Father   . Hyperlipidemia Father   . Coronary artery disease Father   . Hypertension Father   . Cancer Father        esoph  . Stroke Father   . Cancer Maternal Grandfather 32       Prostate and Lung  . Diabetes Sister        Gestational  . Berenice Primas' disease Maternal Aunt   . Cancer Maternal Uncle        Brain  . Stroke Paternal Grandmother   . Heart disease Paternal Grandmother   . Stroke Paternal Grandfather   . Heart disease Paternal Grandfather   . Diabetes Sister        Gestational and Type II  . Hashimoto's thyroiditis Cousin   . Cancer Maternal Uncle        Brain      Objective:   Physical Exam BP 118/88   Pulse 86   Ht 5\' 4"  (1.626 m)   Wt 225 lb 3.2 oz (102.2 kg)   SpO2 94%   BMI 38.66 kg/m    Body mass index is 38.66 kg/m. Wt Readings from Last 3 Encounters:  01/22/18 225 lb 3.2 oz (102.2 kg)  01/15/18 225 lb (102.1 kg)  01/01/18 227 lb (103 kg)   Constitutional: overweight, in NAD Eyes: PERRLA, EOMI, no exophthalmos ENT: moist mucous membranes, no thyromegaly, no cervical lymphadenopathy Cardiovascular: tachycardia, RR, No MRG Respiratory: CTA B Gastrointestinal: abdomen soft, NT, ND, BS+ Musculoskeletal: no deformities, strength intact in all 4 Skin: moist, warm, no  rashes Neurological: no tremor with outstretched hands, DTR normal in all 4   Assessment:     1. DM2, insulin-dependent, uncontrolled, without complications - r/o autoimmunity Component     Latest Ref Rng 08/17/2012  Glutamic Acid Decarb Ab     <=1.0 U/mL <1.0  Pancreatic Islet Cell Antibody     < 5 JDF Units <5  C-Peptide     0.80 - 3.90 ng/mL 1.84  Glucose     70 - 99 mg/dL 104 (H)     2. Obesity class II BMI Classification:  < 18.5 underweight   18.5-24.9 normal weight   25.0-29.9 overweight   30.0-34.9 class I obesity   35.0-39.9 class II obesity   ? 40.0 class III obesity    Plan:     1. Patient with long-standing, uncontrolled, type 2 diabetes, on a complex regimen with oral medicines and basal insulin, returning after another long absence.  Sugars were much better at last visit after changing her diet to include more whole, plant-based foods, and also after starting glipizide.  She was also feeling better after her iron infusions for IDA.  We did not change her regimen at that time.  HbA1c was 6.2%. - At this visit, she returned after a period in which she was depressed, after changing her psychotropic medication and she was going diabetes medication doses.  She also had a period in which she ran out of Januvia and glipizide as she could not afford them.  She is now determined to get back on track, as she is feeling better and got used to taking Cymbalta rather than Celexa.  - We discussed that the regimen that she is on right now was working very well for her at last visit -she tells me she is aware that  her lifestyle and compliance with medications is the most likely culprit in her deteriorating diabetes control.  She agrees to stay on the current regimen and work on her diet and compliance.  Therefore, will not change her regimen at this visit.  I advised her to start checking her sugars once a day at least. - I suggested to:  Patient Instructions  Please continue: -  Lantus 32 units at bedtime - Metformin 1000 mg 2x a day - Januvia 100 mg in am - Glipizide ER 5 mg daily in am, before b'fast  Start checking sugars 1x a day.  Please return in 4 months with your sugar log.   - today, HbA1c is 9.4% (much higher) - start checking sugars at different times of the day - check 1x a day, rotating checks - advised for yearly eye exams >> she is not UTD, but has an exam scheduled - Return to clinic in 4 mo with sugar log   2. Obesity class II - Weight did not change significantly since last visit.  At that time, she was eating more whole food plant-based meals and planning her meals ahead, which helped.  At this visit, she is off the diet, but she is determined to start gaining control of this.   Philemon Kingdom, MD PhD [facility]

## 2018-01-25 ENCOUNTER — Other Ambulatory Visit: Payer: Self-pay | Admitting: Internal Medicine

## 2018-01-25 DIAGNOSIS — L089 Local infection of the skin and subcutaneous tissue, unspecified: Secondary | ICD-10-CM | POA: Diagnosis not present

## 2018-01-25 DIAGNOSIS — L723 Sebaceous cyst: Secondary | ICD-10-CM | POA: Diagnosis not present

## 2018-01-28 DIAGNOSIS — E119 Type 2 diabetes mellitus without complications: Secondary | ICD-10-CM | POA: Diagnosis not present

## 2018-01-28 LAB — HM DIABETES EYE EXAM

## 2018-02-17 ENCOUNTER — Ambulatory Visit: Payer: Self-pay | Admitting: Family Medicine

## 2018-02-22 DIAGNOSIS — S90811A Abrasion, right foot, initial encounter: Secondary | ICD-10-CM | POA: Diagnosis not present

## 2018-02-23 ENCOUNTER — Encounter: Payer: Self-pay | Admitting: Family

## 2018-02-23 ENCOUNTER — Ambulatory Visit: Payer: 59 | Admitting: Family

## 2018-02-23 VITALS — BP 118/82 | HR 111 | Temp 98.2°F | Ht 64.0 in | Wt 224.4 lb

## 2018-02-23 DIAGNOSIS — Z23 Encounter for immunization: Secondary | ICD-10-CM | POA: Diagnosis not present

## 2018-02-23 DIAGNOSIS — L089 Local infection of the skin and subcutaneous tissue, unspecified: Secondary | ICD-10-CM

## 2018-02-23 MED ORDER — SULFAMETHOXAZOLE-TRIMETHOPRIM 800-160 MG PO TABS: 1.0000 | ORAL_TABLET | Freq: Two times a day (BID) | ORAL | 0 refills | Status: DC

## 2018-02-23 MED ORDER — MUPIROCIN CALCIUM 2 % EX CREA: 1.0000 "application " | TOPICAL_CREAM | Freq: Two times a day (BID) | CUTANEOUS | 0 refills | Status: DC

## 2018-02-23 NOTE — Progress Notes (Signed)
Lisa Duran is a 37 y.o. female with the following history as recorded in Epic[employer]:  Patient Active Problem List   Diagnosis Date Noted  . Strain of Achilles tendon, right, initial encounter 01/01/2018  . Sleep-related headache 04/06/2017  . Sleep walking disorder 04/06/2017  . OSA (obstructive sleep apnea) 04/06/2017  . Iron deficiency anemia 03/27/2017  . Chronic fatigue 02/03/2017  . Arthralgia 02/03/2017  . Fibromyalgia 02/03/2017  . GERD (gastroesophageal reflux disease) 06/24/2015  . Eczema 06/22/2015  . Depression 06/22/2015  . Tachycardia 06/16/2014  . Paresthesias 06/12/2014  . Food allergy   . Wrist pain, right 09/14/2013  . Elbow pain, right 09/05/2013  . ADHD (attention deficit hyperactivity disorder) 01/27/2011  . Anxiety 02/20/2010  . ALLERGIC RHINITIS 11/21/2009  . OVARIAN CYST 11/21/2009  . Diabetes mellitus type 2 with neurological manifestations ([place]) 11/20/2009  . Class 2 obesity in adult 11/20/2009  . Migraine without aura 11/20/2009    Current Outpatient Medications  Medication Sig Dispense Refill  . almotriptan (AXERT) 12.5 MG tablet Take 1 tablet (12.5 mg total) by mouth as needed for migraine. may repeat in 2 hours if needed 12 tablet 3  . ALPRAZolam (XANAX) 0.5 MG tablet TAKE 1 TABLET(0.5 MG) BY MOUTH THREE TIMES DAILY AS NEEDED FOR ANXIETY 30 tablet 0  . amphetamine-dextroamphetamine (ADDERALL XR) 20 MG 24 hr capsule Take 1 capsule (20 mg total) by mouth daily. 30 capsule 0  . Cyanocobalamin (B-12) 1000 MCG SUBL Place 1,000 mcg under the tongue daily. 30 each 3  . DULoxetine (CYMBALTA) 60 MG capsule Take 1 capsule (60 mg total) by mouth daily. 90 capsule 3  . fluticasone (FLONASE) 50 MCG/ACT nasal spray Place 2 sprays into both nostrils daily. 16 g 12  . gabapentin (NEURONTIN) 100 MG capsule Take 2 capsules (200 mg total) by mouth at bedtime. 180 capsule 1  . glipiZIDE (GLUCOTROL XL) 5 MG 24 hr tablet TAKE 1 TABLET BY MOUTH  DAILY WITH BREAKFAST 90  tablet 3  . glipiZIDE (GLUCOTROL XL) 5 MG 24 hr tablet Take 1 tablet (5 mg total) by mouth daily with breakfast. 90 tablet 3  . glucose blood (ONETOUCH VERIO) test strip Use 2x a day 200 each 3  . Insulin Glargine (LANTUS SOLOSTAR) 100 UNIT/ML Solostar Pen Inject 32 units at bedtime. 15 pen 1  . Insulin Pen Needle (BD PEN NEEDLE NANO U/F) 32G X 4 MM MISC USE ONCE A DAY 100 each 3  . iron polysaccharides (NIFEREX) 150 MG capsule Take 1 capsule (150 mg total) by mouth daily. 30 capsule 5  . lamoTRIgine (LAMICTAL) 100 MG tablet TAKE 1 TABLET BY MOUTH TWO  TIMES DAILY 180 tablet 1  . levonorgestrel (MIRENA) 20 MCG/24HR IUD 1 Intra Uterine Device (1 each total) by Intrauterine route once. 1 each 0  . metFORMIN (GLUCOPHAGE) 1000 MG tablet TAKE 1 TABLET BY  MOUTH TWICE A DAY WITH A  MEAL 180 tablet 3  . metoprolol succinate (TOPROL-XL) 25 MG 24 hr tablet TAKE ONE-HALF TABLET BY  MOUTH DAILY 45 tablet 1  . omeprazole (PRILOSEC) 40 MG capsule TAKE 1 CAPSULE BY MOUTH  DAILY BEFORE SUPPER. 90 capsule 3  . Prenatal Vit-Fe Fumarate-FA (MULTIVITAMIN-PRENATAL) 27-0.8 MG TABS tablet Take 1 tablet by mouth daily at 12 noon.    . promethazine (PHENERGAN) 25 MG tablet Take 25 mg by mouth every 6 (six) hours as needed for nausea.    . sitaGLIPtin (JANUVIA) 100 MG tablet Take 1 tablet (100 mg total) by mouth  daily. 90 tablet 3  . traMADol-acetaminophen (ULTRACET) 37.5-325 MG per tablet Take 1 tablet by mouth every 6 (six) hours as needed for pain.    [name] triamcinolone cream (KENALOG) 0.1 % Apply topically 2 (two) times daily as needed. 30 g 1  . mupirocin cream (BACTROBAN) 2 % Apply 1 application topically 2 (two) times daily. 15 g 0  . sulfamethoxazole-trimethoprim (BACTRIM DS,SEPTRA DS) 800-160 MG tablet Take 1 tablet by mouth 2 (two) times daily. 14 tablet 0   No current facility-administered medications for this visit.     Allergies: Propofol; Chicken allergy; Gluten meal; Other; and Victoza [liraglutide]  Past  Medical History:  Diagnosis Date  . ADHD (attention deficit hyperactivity disorder)   . ALLERGIC RHINITIS   . Allergy   . Anemia   . ANXIETY   . Depression   . DIABETES MELLITUS, TYPE II   . Fibromyalgia 2018  . GERD (gastroesophageal reflux disease)   . MIGRAINE, COMMON   . Obesity, unspecified   . OVARIAN CYST     Past Surgical History:  Procedure Laterality Date  . NO PAST SURGERIES      Family History  Problem Relation Age of Onset  . Diabetes Mother   . Hyperlipidemia Mother   . Diabetes Father   . Hyperlipidemia Father   . Coronary artery disease Father   . Hypertension Father   . Cancer Father        esoph  . Stroke Father   . Cancer Maternal Grandfather 78       Prostate and Lung  . Diabetes Sister        Gestational  . Berenice Primas' disease Maternal Aunt   . Cancer Maternal Uncle        Brain  . Stroke Paternal Grandmother   . Heart disease Paternal Grandmother   . Stroke Paternal Grandfather   . Heart disease Paternal Grandfather   . Diabetes Sister        Gestational and Type II  . Hashimoto's thyroiditis Cousin   . Cancer Maternal Uncle        Brain    Social History   Tobacco Use  . Smoking status: Former Smoker    Last attempt to quit: 08/20/2002    Years since quitting: 15.5  . Smokeless tobacco: Never Used  . Tobacco comment: single, separated from spouse 02/2010.   Substance Use Topics  . Alcohol use: Yes    Comment: rarely    Subjective:  Right foot injury occurred last week; was walking her dog and leash got caught and caused rope burn; became concerned about worsening appearance; used [employer] through her employer yesterday and was prescribed Keflex but told she should schedule follow-up for re-check; has only taken 2 doses of keflex; denies any fever; is concerned about worsening redness on the top of the foot however; is a Type 2 Diabetic- working on gaining better control of her disease.   Objective:  Vitals:   02/23/18 1459  BP: 118/82   Pulse: (!) 111  Temp: 98.2 F (36.8 C)  TempSrc: Oral  SpO2: 97%  Weight: 224 lb 6.4 oz (101.8 kg)  Height: 5\' 4"  (1.626 m)    General: Well developed, well nourished, in no acute distress  Skin : Warm and dry. Infected wound noted on outer side of right foot with pustular drainage and surrounding erythema Head: Normocephalic and atraumatic  Lungs: Respirations unlabored; Extremities: No edema, cyanosis, clubbing  Vessels: Symmetric bilaterally  Neurologic: Alert and oriented;  speech intact; face symmetrical; moves all extremities well; CNII-XII intact without focal deficit   Assessment:  1. Skin infection   2. Need for diphtheria-tetanus-pertussis (Tdap) vaccine     Plan:  Update Tdap today; d/c Keflex and change to Bactrim DS bid x 7 days; apply Bactroban bid to affected area as well; follow-up worse, no better.   No follow-ups on file.  Orders Placed This Encounter  Procedures  . Tdap vaccine greater than or equal to 7yo IM    Requested Prescriptions   Signed Prescriptions Disp Refills  . sulfamethoxazole-trimethoprim (BACTRIM DS,SEPTRA DS) 800-160 MG tablet 14 tablet 0    Sig: Take 1 tablet by mouth 2 (two) times daily.  . mupirocin cream (BACTROBAN) 2 % 15 g 0    Sig: Apply 1 application topically 2 (two) times daily.

## 2018-02-24 ENCOUNTER — Encounter: Payer: Self-pay | Admitting: Internal Medicine

## 2018-02-25 ENCOUNTER — Encounter: Payer: Self-pay | Admitting: Internal Medicine

## 2018-02-25 ENCOUNTER — Encounter: Payer: Self-pay | Admitting: Family

## 2018-03-19 ENCOUNTER — Encounter: Payer: Self-pay | Admitting: Internal Medicine

## 2018-03-19 NOTE — Telephone Encounter (Signed)
Check [place] registry last filled 01/18/2018. MD is ut of the office will hold until she return on Monday for approval status...Lisa Duran

## 2018-03-21 MED ORDER — AMPHETAMINE-DEXTROAMPHET ER 20 MG PO CP24: 20.0000 mg | ORAL_CAPSULE | Freq: Every day | ORAL | 0 refills | Status: DC

## 2018-04-04 NOTE — Progress Notes (Signed)
Subjective:    Patient ID: Lisa Duran, female    DOB: [birth year], 37 y.o.   MRN: [id number]  HPI The patient is here for follow up.  Fibromyalgia:  She is taking Cymbalta daily and taking gabapentin.  She is sleeping better.  Her pain is better overall.  She was not exercising regularly for a while because of things going on.  She is starting to exercise again - walking dog, yoga.    Depression: She is taking her medication daily as prescribed. She denies any side effects from the medication. She feels her depression is well controlled and she is happy with her current dose of medication.   Anxiety: She is taking her medication daily as prescribed. She denies any side effects from the medication. She feels her anxiety is well controlled and she is happy with her current dose of medication.   ADD:  She is taking her medication as prescribed - taking it during the week, but not on the weekends. .  She feels the medication is effective.  She denies side effects, including palpitations, headaches, lightheadedness, decreased appetite and weight loss.   Diabetes:  She follows with Dr Cruzita Lederer.  She is taking her medication daily as prescribed. She is now compliant with a diabetic diet. She is starting to exercise regularly again.       Medications and allergies reviewed with patient and updated if appropriate.  Patient Active Problem List   Diagnosis Date Noted  . Strain of Achilles tendon, right, initial encounter 01/01/2018  . Sleep-related headache 04/06/2017  . Sleep walking disorder 04/06/2017  . OSA (obstructive sleep apnea) 04/06/2017  . Iron deficiency anemia 03/27/2017  . Chronic fatigue 02/03/2017  . Arthralgia 02/03/2017  . Fibromyalgia 02/03/2017  . GERD (gastroesophageal reflux disease) 06/24/2015  . Eczema 06/22/2015  . Depression 06/22/2015  . Tachycardia 06/16/2014  . Paresthesias 06/12/2014  . Food allergy   . Wrist pain, right 09/14/2013  . Elbow pain, right  09/05/2013  . ADHD (attention deficit hyperactivity disorder) 01/27/2011  . Anxiety 02/20/2010  . ALLERGIC RHINITIS 11/21/2009  . OVARIAN CYST 11/21/2009  . Diabetes mellitus type 2 with neurological manifestations ([place]) 11/20/2009  . Class 2 obesity in adult 11/20/2009  . Migraine without aura 11/20/2009    Current Outpatient Medications on File Prior to Visit  Medication Sig Dispense Refill  . almotriptan (AXERT) 12.5 MG tablet Take 1 tablet (12.5 mg total) by mouth as needed for migraine. may repeat in 2 hours if needed 12 tablet 3  . ALPRAZolam (XANAX) 0.5 MG tablet TAKE 1 TABLET(0.5 MG) BY MOUTH THREE TIMES DAILY AS NEEDED FOR ANXIETY 30 tablet 0  . amphetamine-dextroamphetamine (ADDERALL XR) 20 MG 24 hr capsule Take 1 capsule (20 mg total) by mouth daily. 30 capsule 0  . Cyanocobalamin (B-12) 1000 MCG SUBL Place 1,000 mcg under the tongue daily. 30 each 3  . DULoxetine (CYMBALTA) 60 MG capsule Take 1 capsule (60 mg total) by mouth daily. 90 capsule 3  . fluticasone (FLONASE) 50 MCG/ACT nasal spray Place 2 sprays into both nostrils daily. 16 g 12  . gabapentin (NEURONTIN) 100 MG capsule Take 2 capsules (200 mg total) by mouth at bedtime. 180 capsule 1  . glipiZIDE (GLUCOTROL XL) 5 MG 24 hr tablet Take 1 tablet (5 mg total) by mouth daily with breakfast. 90 tablet 3  . glucose blood (ONETOUCH VERIO) test strip Use 2x a day 200 each 3  . Insulin Glargine (LANTUS SOLOSTAR) 100 UNIT/ML  Solostar Pen Inject 32 units at bedtime. 15 pen 1  . Insulin Pen Needle (BD PEN NEEDLE NANO U/F) 32G X 4 MM MISC USE ONCE A DAY 100 each 3  . iron polysaccharides (NIFEREX) 150 MG capsule Take 1 capsule (150 mg total) by mouth daily. 30 capsule 5  . lamoTRIgine (LAMICTAL) 100 MG tablet TAKE 1 TABLET BY MOUTH TWO  TIMES DAILY 180 tablet 1  . levonorgestrel (MIRENA) 20 MCG/24HR IUD 1 Intra Uterine Device (1 each total) by Intrauterine route once. 1 each 0  . metFORMIN (GLUCOPHAGE) 1000 MG tablet TAKE 1 TABLET  BY  MOUTH TWICE A DAY WITH A  MEAL 180 tablet 3  . metoprolol succinate (TOPROL-XL) 25 MG 24 hr tablet TAKE ONE-HALF TABLET BY  MOUTH DAILY 45 tablet 1  . mupirocin cream (BACTROBAN) 2 % Apply 1 application topically 2 (two) times daily. 15 g 0  . omeprazole (PRILOSEC) 40 MG capsule TAKE 1 CAPSULE BY MOUTH  DAILY BEFORE SUPPER. 90 capsule 3  . Prenatal Vit-Fe Fumarate-FA (MULTIVITAMIN-PRENATAL) 27-0.8 MG TABS tablet Take 1 tablet by mouth daily at 12 noon.    . promethazine (PHENERGAN) 25 MG tablet Take 25 mg by mouth every 6 (six) hours as needed for nausea.    . sitaGLIPtin (JANUVIA) 100 MG tablet Take 1 tablet (100 mg total) by mouth daily. 90 tablet 3  . traMADol-acetaminophen (ULTRACET) 37.5-325 MG per tablet Take 1 tablet by mouth every 6 (six) hours as needed for pain.    [name] triamcinolone cream (KENALOG) 0.1 % Apply topically 2 (two) times daily as needed. 30 g 1   No current facility-administered medications on file prior to visit.     Past Medical History:  Diagnosis Date  . ADHD (attention deficit hyperactivity disorder)   . ALLERGIC RHINITIS   . Allergy   . Anemia   . ANXIETY   . Depression   . DIABETES MELLITUS, TYPE II   . Fibromyalgia 2018  . GERD (gastroesophageal reflux disease)   . MIGRAINE, COMMON   . Obesity, unspecified   . OVARIAN CYST     Past Surgical History:  Procedure Laterality Date  . NO PAST SURGERIES      Social History   Socioeconomic History  . Marital status: Single    Spouse name: Not on file  . Number of children: Not on file  . Years of education: Not on file  . Highest education level: Not on file  Occupational History  . Not on file  Social Needs  . Financial resource strain: Not on file  . Food insecurity:    Worry: Not on file    Inability: Not on file  . Transportation needs:    Medical: Not on file    Non-medical: Not on file  Tobacco Use  . Smoking status: Former Smoker    Last attempt to quit: 08/20/2002    Years since  quitting: 15.6  . Smokeless tobacco: Never Used  . Tobacco comment: single, separated from spouse 02/2010.   Substance and Sexual Activity  . Alcohol use: Yes    Comment: rarely  . Drug use: No  . Sexual activity: Not Currently    Birth control/protection: IUD  Lifestyle  . Physical activity:    Days per week: Not on file    Minutes per session: Not on file  . Stress: Not on file  Relationships  . Social connections:    Talks on phone: Not on file    Gets together:  Not on file    Attends religious service: Not on file    Active member of club or organization: Not on file    Attends meetings of clubs or organizations: Not on file    Relationship status: Not on file  Other Topics Concern  . Not on file  Social History Narrative   Single, separated from spouse 02/2010    Family History  Problem Relation Age of Onset  . Diabetes Mother   . Hyperlipidemia Mother   . Diabetes Father   . Hyperlipidemia Father   . Coronary artery disease Father   . Hypertension Father   . Cancer Father        esoph  . Stroke Father   . Cancer Maternal Grandfather 65       Prostate and Lung  . Diabetes Sister        Gestational  . Berenice Primas' disease Maternal Aunt   . Cancer Maternal Uncle        Brain  . Stroke Paternal Grandmother   . Heart disease Paternal Grandmother   . Stroke Paternal Grandfather   . Heart disease Paternal Grandfather   . Diabetes Sister        Gestational and Type II  . Hashimoto's thyroiditis Cousin   . Cancer Maternal Uncle        Brain    Review of Systems  Constitutional: Negative for chills and fever.  Respiratory: Negative for cough, shortness of breath and wheezing.   Cardiovascular: Positive for chest pain (tightness or vice across chest  - not anxiety, non cardiac) and palpitations. Negative for leg swelling.  Gastrointestinal:       GERd controlled  Musculoskeletal: Positive for myalgias.  Neurological: Positive for headaches. Negative for  light-headedness.       Objective:   Vitals:   04/06/18 0828  BP: 118/80  Pulse: 98  Resp: 16  Temp: 98.5 F (36.9 C)  SpO2: 96%   BP Readings from Last 3 Encounters:  04/06/18 118/80  02/23/18 118/82  01/22/18 118/88   Wt Readings from Last 3 Encounters:  04/06/18 228 lb (103.4 kg)  02/23/18 224 lb 6.4 oz (101.8 kg)  01/22/18 225 lb 3.2 oz (102.2 kg)   Body mass index is 39.14 kg/m.   Physical Exam    Constitutional: Appears well-developed and well-nourished. No distress.  HENT:  Head: Normocephalic and atraumatic.  Neck: Neck supple. No tracheal deviation present. No thyromegaly present.  No cervical lymphadenopathy Cardiovascular: Normal rate, regular rhythm and normal heart sounds.   No murmur heard. No carotid bruit .  No edema Pulmonary/Chest: Effort normal and breath sounds normal. No respiratory distress. No has no wheezes. No rales.  Skin: Skin is warm and dry. Not diaphoretic.  Psychiatric: Normal mood and affect. Behavior is normal.      Assessment & Plan:    See Problem List for Assessment and Plan of chronic medical problems.

## 2018-04-05 ENCOUNTER — Telehealth: Payer: Self-pay | Admitting: Hematology

## 2018-04-05 NOTE — Telephone Encounter (Signed)
Patient called to reschedule  °

## 2018-04-06 ENCOUNTER — Ambulatory Visit: Payer: 59 | Admitting: Internal Medicine

## 2018-04-06 ENCOUNTER — Encounter: Payer: Self-pay | Admitting: Internal Medicine

## 2018-04-06 VITALS — BP 118/80 | HR 98 | Temp 98.5°F | Resp 16 | Ht 64.0 in | Wt 228.0 lb

## 2018-04-06 DIAGNOSIS — G43009 Migraine without aura, not intractable, without status migrainosus: Secondary | ICD-10-CM | POA: Diagnosis not present

## 2018-04-06 DIAGNOSIS — F419 Anxiety disorder, unspecified: Secondary | ICD-10-CM

## 2018-04-06 DIAGNOSIS — R69 Illness, unspecified: Secondary | ICD-10-CM | POA: Diagnosis not present

## 2018-04-06 DIAGNOSIS — E1149 Type 2 diabetes mellitus with other diabetic neurological complication: Secondary | ICD-10-CM

## 2018-04-06 DIAGNOSIS — F909 Attention-deficit hyperactivity disorder, unspecified type: Secondary | ICD-10-CM

## 2018-04-06 DIAGNOSIS — F32A Depression, unspecified: Secondary | ICD-10-CM

## 2018-04-06 DIAGNOSIS — F329 Major depressive disorder, single episode, unspecified: Secondary | ICD-10-CM

## 2018-04-06 DIAGNOSIS — M797 Fibromyalgia: Secondary | ICD-10-CM

## 2018-04-06 NOTE — Patient Instructions (Addendum)
  Test(s) ordered today. Your results will be released to [place] (or called to you) after review, usually within 72hours after test completion. If any changes need to be made, you will be notified at that same time.   Medications reviewed and updated.  No changes recommended at this time.   Please followup in 6 months for a physical exam   List of therapist:  [facility] [place]  [facility] [place] [facility] [place]  [place], [place]  [facility] [place], [place], [place], [address] [phone] Office [phone] [facility]            [place], [place]

## 2018-04-06 NOTE — Assessment & Plan Note (Signed)
Fairly controlled, stable Continue current dose of medication-Cymbalta daily

## 2018-04-06 NOTE — Assessment & Plan Note (Addendum)
Migraines not frequent Takes axert Occasionally takes Excedrin Migraine Overall controlled

## 2018-04-06 NOTE — Assessment & Plan Note (Signed)
Fairly controlled, stable Continue current dose of medication-Cymbalta daily and Xanax as needed

## 2018-04-06 NOTE — Assessment & Plan Note (Signed)
Overall improved Pain manageable Continue current dose of Cymbalta-60 mg daily Continue gabapentin 200 mg at bedtime She is restarting regular exercise

## 2018-04-06 NOTE — Assessment & Plan Note (Signed)
Stable, controlled Take medications during the week only Continue current dose medication No refill needed today

## 2018-04-06 NOTE — Assessment & Plan Note (Signed)
Following with Dr. Cruzita Lederer Sugars poorly controlled when most recently checked Because of the life circumstances she was not doing what she should be doing, but is back on track and sugars are improving Has follow-up with Dr. Cruzita Lederer

## 2018-04-08 ENCOUNTER — Other Ambulatory Visit: Payer: Self-pay | Admitting: Internal Medicine

## 2018-04-16 ENCOUNTER — Other Ambulatory Visit: Payer: Self-pay

## 2018-04-16 ENCOUNTER — Ambulatory Visit: Payer: Self-pay | Admitting: Hematology

## 2018-04-26 ENCOUNTER — Other Ambulatory Visit: Payer: Self-pay

## 2018-04-26 DIAGNOSIS — D508 Other iron deficiency anemias: Secondary | ICD-10-CM

## 2018-04-27 ENCOUNTER — Inpatient Hospital Stay (HOSPITAL_BASED_OUTPATIENT_CLINIC_OR_DEPARTMENT_OTHER): Payer: 59 | Admitting: Hematology

## 2018-04-27 ENCOUNTER — Inpatient Hospital Stay: Payer: 59 | Attending: Hematology

## 2018-04-27 VITALS — BP 133/95 | HR 108 | Temp 98.2°F | Resp 18 | Ht 64.0 in | Wt 229.0 lb

## 2018-04-27 DIAGNOSIS — L309 Dermatitis, unspecified: Secondary | ICD-10-CM | POA: Insufficient documentation

## 2018-04-27 DIAGNOSIS — K449 Diaphragmatic hernia without obstruction or gangrene: Secondary | ICD-10-CM | POA: Insufficient documentation

## 2018-04-27 DIAGNOSIS — Z794 Long term (current) use of insulin: Secondary | ICD-10-CM | POA: Insufficient documentation

## 2018-04-27 DIAGNOSIS — F909 Attention-deficit hyperactivity disorder, unspecified type: Secondary | ICD-10-CM | POA: Diagnosis not present

## 2018-04-27 DIAGNOSIS — M797 Fibromyalgia: Secondary | ICD-10-CM | POA: Insufficient documentation

## 2018-04-27 DIAGNOSIS — R69 Illness, unspecified: Secondary | ICD-10-CM | POA: Diagnosis not present

## 2018-04-27 DIAGNOSIS — K5900 Constipation, unspecified: Secondary | ICD-10-CM

## 2018-04-27 DIAGNOSIS — F419 Anxiety disorder, unspecified: Secondary | ICD-10-CM | POA: Diagnosis not present

## 2018-04-27 DIAGNOSIS — K589 Irritable bowel syndrome without diarrhea: Secondary | ICD-10-CM

## 2018-04-27 DIAGNOSIS — E119 Type 2 diabetes mellitus without complications: Secondary | ICD-10-CM | POA: Diagnosis not present

## 2018-04-27 DIAGNOSIS — D473 Essential (hemorrhagic) thrombocythemia: Secondary | ICD-10-CM

## 2018-04-27 DIAGNOSIS — E669 Obesity, unspecified: Secondary | ICD-10-CM | POA: Diagnosis not present

## 2018-04-27 DIAGNOSIS — Z87891 Personal history of nicotine dependence: Secondary | ICD-10-CM | POA: Diagnosis not present

## 2018-04-27 DIAGNOSIS — Z79899 Other long term (current) drug therapy: Secondary | ICD-10-CM

## 2018-04-27 DIAGNOSIS — D72829 Elevated white blood cell count, unspecified: Secondary | ICD-10-CM | POA: Insufficient documentation

## 2018-04-27 DIAGNOSIS — E538 Deficiency of other specified B group vitamins: Secondary | ICD-10-CM

## 2018-04-27 DIAGNOSIS — D508 Other iron deficiency anemias: Secondary | ICD-10-CM

## 2018-04-27 DIAGNOSIS — D509 Iron deficiency anemia, unspecified: Secondary | ICD-10-CM | POA: Diagnosis not present

## 2018-04-27 DIAGNOSIS — D721 Eosinophilia: Secondary | ICD-10-CM

## 2018-04-27 LAB — CBC WITH DIFFERENTIAL (CANCER CENTER ONLY)
Basophils Absolute: 0.1 10*3/uL (ref 0.0–0.1)
Basophils Relative: 0 %
Eosinophils Absolute: 0.4 10*3/uL (ref 0.0–0.5)
Eosinophils Relative: 3 %
HEMATOCRIT: 40.7 % (ref 34.8–46.6)
HEMOGLOBIN: 12.9 g/dL (ref 11.6–15.9)
LYMPHS ABS: 2.6 10*3/uL (ref 0.9–3.3)
LYMPHS PCT: 22 %
MCH: 26.9 pg (ref 25.1–34.0)
MCHC: 31.7 g/dL (ref 31.5–36.0)
MCV: 85 fL (ref 79.5–101.0)
MONOS PCT: 6 %
Monocytes Absolute: 0.7 10*3/uL (ref 0.1–0.9)
NEUTROS ABS: 7.8 10*3/uL — AB (ref 1.5–6.5)
Neutrophils Relative %: 69 %
Platelet Count: 353 10*3/uL (ref 145–400)
RBC: 4.79 MIL/uL (ref 3.70–5.45)
RDW: 15.7 % — ABNORMAL HIGH (ref 11.2–14.5)
WBC Count: 11.5 10*3/uL — ABNORMAL HIGH (ref 3.9–10.3)

## 2018-04-27 LAB — CMP (CANCER CENTER ONLY)
ALT: 44 U/L (ref 0–44)
AST: 36 U/L (ref 15–41)
Albumin: 3.8 g/dL (ref 3.5–5.0)
Alkaline Phosphatase: 157 U/L — ABNORMAL HIGH (ref 38–126)
Anion gap: 11 (ref 5–15)
BILIRUBIN TOTAL: 0.3 mg/dL (ref 0.3–1.2)
BUN: 10 mg/dL (ref 6–20)
CHLORIDE: 103 mmol/L (ref 98–111)
CO2: 27 mmol/L (ref 22–32)
Calcium: 10 mg/dL (ref 8.9–10.3)
Creatinine: 0.85 mg/dL (ref 0.44–1.00)
GFR, Estimated: >60 mL/min (ref >60)
Glucose, Bld: 87 mg/dL (ref 70–99)
POTASSIUM: 4 mmol/L (ref 3.5–5.1)
Sodium: 141 mmol/L (ref 135–145)
TOTAL PROTEIN: 7.9 g/dL (ref 6.5–8.1)

## 2018-04-27 LAB — RETICULOCYTES
RBC.: 4.79 MIL/uL (ref 3.70–5.45)
RETIC CT PCT: 1.2 % (ref 0.7–2.1)
Retic Count, Absolute: 57.5 10*3/uL (ref 33.7–90.7)

## 2018-04-27 NOTE — Progress Notes (Signed)
[name]    HEMATOLOGY/ONCOLOGY CLINIC NOTE  Date of Service: 04/27/18    Patient Care Team: Binnie Rail, MD as PCP - General (Internal Medicine) Servando Salina, MD as Consulting Physician (Obstetrics and Gynecology) Harold Hedge, Darrick Grinder, MD (Allergy and Immunology) Philemon Kingdom, MD (Endocrinology) Dennard Nip, NP as Nurse Practitioner (Psychiatry) Calvert Cantor, MD (Ophthalmology) Pieter Partridge, DO (Neurology)  CHIEF COMPLAINTS/PURPOSE OF CONSULTATION:  F/u for iron deficiency anemia and thrombocytosis  HISTORY OF PRESENTING ILLNESS:   Lisa Duran is a wonderful 37 y.o. female who has been referred to [place] by Dr .Quay Burow, Claudina Lick, MD for evaluation and management of evaluation of Anemia and thrombocytosis.  Patient has h/o fibromyalga, significant eczema, possible IBS (self decided to pursue gluten free diet), DM2, ADHD, anxiety, obesity had labs with her PCP On 02/03/2017 and was noted to have anemia with a hgb of 10.8 with MCV of 66.3 with elevated platelets of 573k with borderline leucocytosis of 12.7 with  Eosinophilia of 1.3k.  Hgb 1  Yr ago was 11.6 with MCV of 74.6 and platelets of 538k  She has had significantly elevated inflammatory markers (sed rate 112) and has been in the process of having a rheumatology w/u with Gavin Pound MD.  No fatigue but no evident LNadenopathy, splenomegaly . No fevers/chills. Notes that she has had a Mirena IUD and notes no significant menstrual losses. No other overt bleeding. Has been on chronic PPI therapy. No overt weight loss or other focal symptoms suggestive of malignancy. Patient has previosuly had EGD in 09/2015 that showed mild pan gastritis and HH without cameron ulcers.   INTERVAL HISTORY   Lisa Duran is here for f/u of her iron deficiency anemia. The patient's last visit with [place] was on 12/08/17. The pt reports that she is doing well overall.   The pt reports that she is taking 153m Iron Polysaccharide 2-3  times each week with some mild constipation. The pt notes that she barely has her periods anymore since beginning on IUD.   The pt notes that she began Gabapentin in the interim which has more successfully treated her symptoms from fibro myalgia.   Lab results today (04/27/18) of CBC w/diff, CMP, and Reticulocytes is as follows: all values are WNL except for WBC at 11.5k, RDW at 15.7, ANC at 7.8k, Alk Phos at 157. 04/27/18 Ferritin is 51  On review of systems, pt reports good energy levels, and denies nose bleeds, gum bleeds, blood in the stools, fatigue, dizziness, light headedness, abdominal pains, leg swelling, and any other symptoms.    MEDICAL HISTORY:  Past Medical History:  Diagnosis Date  . ADHD (attention deficit hyperactivity disorder)   . ALLERGIC RHINITIS   . Allergy   . Anemia   . ANXIETY   . Depression   . DIABETES MELLITUS, TYPE II   . Fibromyalgia 2018  . GERD (gastroesophageal reflux disease)   . MIGRAINE, COMMON   . Obesity, unspecified   . OVARIAN CYST     SURGICAL HISTORY: Past Surgical History:  Procedure Laterality Date  . NO PAST SURGERIES      SOCIAL HISTORY: Social History   Socioeconomic History  . Marital status: Single    Spouse name: Not on file  . Number of children: Not on file  . Years of education: Not on file  . Highest education level: Not on file  Occupational History  . Not on file  Social Needs  . Financial resource strain: Not on file  .  Food insecurity:    Worry: Not on file    Inability: Not on file  . Transportation needs:    Medical: Not on file    Non-medical: Not on file  Tobacco Use  . Smoking status: Former Smoker    Last attempt to quit: 08/20/2002    Years since quitting: 15.6  . Smokeless tobacco: Never Used  . Tobacco comment: single, separated from spouse 02/2010.   Substance and Sexual Activity  . Alcohol use: Yes    Comment: rarely  . Drug use: No  . Sexual activity: Not Currently    Birth  control/protection: IUD  Lifestyle  . Physical activity:    Days per week: Not on file    Minutes per session: Not on file  . Stress: Not on file  Relationships  . Social connections:    Talks on phone: Not on file    Gets together: Not on file    Attends religious service: Not on file    Active member of club or organization: Not on file    Attends meetings of clubs or organizations: Not on file    Relationship status: Not on file  . Intimate partner violence:    Fear of current or ex partner: Not on file    Emotionally abused: Not on file    Physically abused: Not on file    Forced sexual activity: Not on file  Other Topics Concern  . Not on file  Social History Narrative   Single, separated from spouse 02/2010    FAMILY HISTORY: Family History  Problem Relation Age of Onset  . Diabetes Mother   . Hyperlipidemia Mother   . Diabetes Father   . Hyperlipidemia Father   . Coronary artery disease Father   . Hypertension Father   . Cancer Father        esoph  . Stroke Father   . Cancer Maternal Grandfather 68       Prostate and Lung  . Diabetes Sister        Gestational  . Berenice Primas' disease Maternal Aunt   . Cancer Maternal Uncle        Brain  . Stroke Paternal Grandmother   . Heart disease Paternal Grandmother   . Stroke Paternal Grandfather   . Heart disease Paternal Grandfather   . Diabetes Sister        Gestational and Type II  . Hashimoto's thyroiditis Cousin   . Cancer Maternal Uncle        Brain    ALLERGIES:  is allergic to propofol; chicken allergy; gluten meal; other; and victoza [liraglutide].  MEDICATIONS:  Current Outpatient Medications  Medication Sig Dispense Refill  . almotriptan (AXERT) 12.5 MG tablet Take 1 tablet (12.5 mg total) by mouth as needed for migraine. may repeat in 2 hours if needed 12 tablet 3  . ALPRAZolam (XANAX) 0.5 MG tablet TAKE 1 TABLET(0.5 MG) BY MOUTH THREE TIMES DAILY AS NEEDED FOR ANXIETY 30 tablet 0  .  amphetamine-dextroamphetamine (ADDERALL XR) 20 MG 24 hr capsule Take 1 capsule (20 mg total) by mouth daily. 30 capsule 0  . Cyanocobalamin (B-12) 1000 MCG SUBL Place 1,000 mcg under the tongue daily. 30 each 3  . DULoxetine (CYMBALTA) 60 MG capsule Take 1 capsule (60 mg total) by mouth daily. 90 capsule 3  . fluticasone (FLONASE) 50 MCG/ACT nasal spray Place 2 sprays into both nostrils daily. 16 g 12  . gabapentin (NEURONTIN) 100 MG capsule Take 2 capsules (200  mg total) by mouth at bedtime. 180 capsule 1  . glipiZIDE (GLUCOTROL XL) 5 MG 24 hr tablet Take 1 tablet (5 mg total) by mouth daily with breakfast. 90 tablet 3  . glucose blood (ONETOUCH VERIO) test strip Use 2x a day 200 each 3  . Insulin Glargine (LANTUS SOLOSTAR) 100 UNIT/ML Solostar Pen Inject 32 units at bedtime. 15 pen 1  . Insulin Pen Needle (BD PEN NEEDLE NANO U/F) 32G X 4 MM MISC USE ONCE A DAY 100 each 3  . iron polysaccharides (NIFEREX) 150 MG capsule Take 1 capsule (150 mg total) by mouth daily. 30 capsule 5  . lamoTRIgine (LAMICTAL) 100 MG tablet TAKE 1 TABLET BY MOUTH TWO  TIMES DAILY 180 tablet 1  . levonorgestrel (MIRENA) 20 MCG/24HR IUD 1 Intra Uterine Device (1 each total) by Intrauterine route once. 1 each 0  . metFORMIN (GLUCOPHAGE) 1000 MG tablet TAKE 1 TABLET BY  MOUTH TWICE A DAY WITH A  MEAL 180 tablet 3  . metoprolol succinate (TOPROL-XL) 25 MG 24 hr tablet TAKE ONE-HALF TABLET BY  MOUTH DAILY 45 tablet 1  . mupirocin cream (BACTROBAN) 2 % Apply 1 application topically 2 (two) times daily. 15 g 0  . omeprazole (PRILOSEC) 40 MG capsule TAKE 1 CAPSULE BY MOUTH  DAILY BEFORE SUPPER 90 capsule 3  . Prenatal Vit-Fe Fumarate-FA (MULTIVITAMIN-PRENATAL) 27-0.8 MG TABS tablet Take 1 tablet by mouth daily at 12 noon.    . promethazine (PHENERGAN) 25 MG tablet Take 25 mg by mouth every 6 (six) hours as needed for nausea.    . sitaGLIPtin (JANUVIA) 100 MG tablet Take 1 tablet (100 mg total) by mouth daily. 90 tablet 3  .  traMADol-acetaminophen (ULTRACET) 37.5-325 MG per tablet Take 1 tablet by mouth every 6 (six) hours as needed for pain.    [name] triamcinolone cream (KENALOG) 0.1 % Apply topically 2 (two) times daily as needed. 30 g 1   No current facility-administered medications for this visit.     REVIEW OF SYSTEMS:    A 10+ POINT REVIEW OF SYSTEMS WAS OBTAINED including neurology, dermatology, psychiatry, cardiac, respiratory, lymph, extremities, GI, GU, Musculoskeletal, constitutional, breasts, reproductive, HEENT.  All pertinent positives are noted in the HPI.  All others are negative.   PHYSICAL EXAMINATION: ECOG PERFORMANCE STATUS: 1 - Symptomatic but completely ambulatory  Vitals:   04/27/18 1502  BP: (!) 133/95  Pulse: (!) 108  Resp: 18  Temp: 98.2 F (36.8 C)  SpO2: 98%   Filed Weights   04/27/18 1502  Weight: 229 lb (103.9 kg)   .Body mass index is 39.31 kg/m.   GENERAL:alert, in no acute distress and comfortable SKIN: no acute rashes, no significant lesions EYES: conjunctiva are pink and non-injected, sclera anicteric OROPHARYNX: MMM, no exudates, no oropharyngeal erythema or ulceration NECK: supple, no JVD LYMPH:  no palpable lymphadenopathy in the cervical, axillary or inguinal regions LUNGS: clear to auscultation b/l with normal respiratory effort HEART: regular rate & rhythm ABDOMEN:  normoactive bowel sounds , non tender, not distended. No palpable hepatosplenomegaly.  Extremity: no pedal edema PSYCH: alert & oriented x 3 with fluent speech NEURO: no focal motor/sensory deficits   LABORATORY DATA:  I have reviewed the data as listed  . CBC Latest Ref Rng & Units 04/27/2018 12/08/2017 05/27/2017  WBC 3.9 - 10.3 K/uL 11.5(H) 12.1(H) 10.2  Hemoglobin 11.6 - 15.9 g/dL 12.9 14.0 13.7  Hematocrit 34.8 - 46.6 % 40.7 43.3 42.0  Platelets 145 - 400 K/uL  353 316 361   . CBC    Component Value Date/Time   WBC 11.5 (H) 04/27/2018 1426   WBC 10.2 05/27/2017 0838   WBC 12.7  (H) 02/03/2017 1621   RBC 4.79 04/27/2018 1426   RBC 4.79 04/27/2018 1426   HGB 12.9 04/27/2018 1426   HGB 13.7 05/27/2017 0838   HCT 40.7 04/27/2018 1426   HCT 42.0 05/27/2017 0838   PLT 353 04/27/2018 1426   PLT 361 05/27/2017 0838   MCV 85.0 04/27/2018 1426   MCV 79.4 (L) 05/27/2017 0838   MCH 26.9 04/27/2018 1426   MCHC 31.7 04/27/2018 1426   RDW 15.7 (H) 04/27/2018 1426   RDW 30.3 (H) 05/27/2017 0838   LYMPHSABS 2.6 04/27/2018 1426   LYMPHSABS 2.1 05/27/2017 0838   MONOABS 0.7 04/27/2018 1426   MONOABS 0.8 05/27/2017 0838   EOSABS 0.4 04/27/2018 1426   EOSABS 0.5 05/27/2017 0838   BASOSABS 0.1 04/27/2018 1426   BASOSABS 0.1 05/27/2017 0838     . CMP Latest Ref Rng & Units 04/27/2018 12/08/2017 05/27/2017  Glucose 70 - 99 mg/dL 87 79 166(H)  BUN 6 - 20 mg/dL 10 11 8.0  Creatinine 0.44 - 1.00 mg/dL 0.85 0.82 0.8  Sodium 135 - 145 mmol/L 141 138 137  Potassium 3.5 - 5.1 mmol/L 4.0 4.0 4.5  Chloride 98 - 111 mmol/L 103 103 -  CO2 22 - 32 mmol/L _0 Calcium 8.9 - 10.3 mg/dL 10.0 9.9 9.2  Total Protein 6.5 - 8.1 g/dL 7.9 8.0 7.4  Total Bilirubin 0.3 - 1.2 mg/dL 0.3 0.3 0.35  Alkaline Phos 38 - 126 U/L 157(H) 151(H) 152(H)  AST 15 - 41 U/L 36 27 17  ALT 0 - 44 U/L 44 31 29   . Lab Results  Component Value Date   IRON 27 (L) 04/27/2018   TIBC 370 04/27/2018   IRONPCTSAT 7 (L) 04/27/2018   (Iron and TIBC)  Lab Results  Component Value Date   FERRITIN 51 04/27/2018     RADIOGRAPHIC STUDIES: I have personally reviewed the radiological images as listed and agreed with the findings in the report. No results found.  ASSESSMENT & PLAN:   37 y.o. with    1) s/p  Chronic Microcytic Anemia - primarily from Iron deficiency. Noted to have Iron deficiency with ferritin level of 15 with 4% iron saturation. Sed rate better with significantly elevated CRP levels - still suggest significant inflammation. . -She is allergic to chicken so she does not eat much meat  in her diet. She is on a gluten free diet as this effected her stomach.  Lab Results  Component Value Date   IRON 27 (L) 04/27/2018   TIBC 370 04/27/2018   IRONPCTSAT 7 (L) 04/27/2018   (Iron and TIBC)  Lab Results  Component Value Date   FERRITIN 51 04/27/2018    2) Chronic Thrombocytosis - likely reactive from Iron deficiency and inflammation. Resolved after adequate Iron replacement.  3) Mild leucocytosis - reactive -- obesity, inflammation, Eczema, Adderall etc - WBC at 11.5 4) Eosinophilia - likely from allergies/Eczema -- resolved today  Plan   -Discussed pt labwork today, 04/27/18; HGB stable at 12.9, ANC at 7.8k, thrombocytosis is resolved. Leukocytosis is stable  -Ferritin is 51 -Continue taking 122m Iron Polysaccharide po daily -Pt began IUD which has successfully alleviated her previously heavy periods, and improved her iron retention -Recommend PCP continue to check Ferritin levels and manage PO Iron replacement -Will be happy  to see this pt back again as needed  -She will continue OTC B12 1050mg daily and B complex. I recommend liquid form as is may be more palatable. Repeat B12 level with PCP  #5. Fibromyalgia  She is currently on gabapentin and Cymbalta to help her fibromyalgia   #6 Eczema  -Uses steroid creme as needed along with watching her diet.    RTC with Dr KIrene Limboas needed   No orders of the defined types were placed in this encounter.  All of the patients questions were answered with apparent satisfaction. The patient knows to call the clinic with any problems, questions or concerns.  The total time spent in the appt was 20 minutes and more than 50% was on counseling and direct patient cares.    GSullivan LoneMD MS AAHIVMS SAdventhealth New SmyrnaCUh Cant[facility] LLCHematology/Oncology Physician [facility] (Office):       [phone](Work cell):  [phone](Fax):           [phone] 04/27/2018 3:52 PM   I, SBaldwin Jamaica am acting as a scribe for Dr.  KIrene Limbo .I have reviewed the above documentation for accuracy and completeness, and I agree with the above. .sacroiliac

## 2018-04-28 ENCOUNTER — Telehealth: Payer: Self-pay

## 2018-04-28 LAB — IRON AND TIBC
Iron: 27 ug/dL — ABNORMAL LOW (ref 41–142)
SATURATION RATIOS: 7 % — AB (ref 21–57)
TIBC: 370 ug/dL (ref 236–444)
UIBC: 343 ug/dL

## 2018-04-28 LAB — FERRITIN: Ferritin: 51 ng/mL (ref 11–307)

## 2018-04-28 NOTE — Telephone Encounter (Signed)
Return as needed per 9/18 los

## 2018-05-27 ENCOUNTER — Ambulatory Visit: Payer: 59 | Admitting: Internal Medicine

## 2018-05-27 ENCOUNTER — Encounter: Payer: Self-pay | Admitting: Internal Medicine

## 2018-05-27 VITALS — BP 122/86 | HR 107 | Ht 64.0 in | Wt 229.0 lb

## 2018-05-27 DIAGNOSIS — Z6838 Body mass index (BMI) 38.0-38.9, adult: Secondary | ICD-10-CM

## 2018-05-27 DIAGNOSIS — E1149 Type 2 diabetes mellitus with other diabetic neurological complication: Secondary | ICD-10-CM | POA: Diagnosis not present

## 2018-05-27 LAB — POCT GLYCOSYLATED HEMOGLOBIN (HGB A1C): Hemoglobin A1C: 7.2 % — AB (ref 4.0–5.6)

## 2018-05-27 NOTE — Progress Notes (Signed)
Subjective:     Patient ID: Lisa Duran, female   DOB: 1980/12/07, 37 y.o.   MRN: [id number]  HPI Lisa Duran is a pleasant 37 y.o. woman returning for f/u for DM2, insulin dependent, uncontrolled, dx 80/1655, w/o complications. Last visit 4 mo ago.  She is doing much better at last visit with med compliance. She also started to exercise: yoga, walking. Sugars have improved.  She feels much better on Cymbalta.  Last HbA1c: Lab Results  Component Value Date   HGBA1C 9.4 (A) 01/22/2018   HGBA1C 6.2 05/25/2017   HGBA1C 7.6 12/25/2016   She is on:  - Lantus 32 units at bedtime - Metformin 1000 mg 2x a day - Januvia 100 mg in am - Glipizide ER 5 mg before b'fast  We tried Cycloset 0.8 mg daily >> stopped end of 11/2016 b/c GERD/AP/C We tried Trulicity >> could not tolerate it: N/V/AP. She restarted Januvia 09/2016. She was on Invokana 100 mg in am (started 12/2014) >> 2 yeast inf >> tx with Diflucan; she was exhausted and had nocturia >> stopped 03/12/2015 Tried Victoza 2 mo ago >> AP, severe GERD, inj site rxn Tried Januvia >> tolerated it well, but stopped when Invokana was suggested.  She was also on Actos, but taken off b/c good control.  She has a [employer], structural.  She checks sugars 1x a day: - am:138, 199 >> 78-161 >> 207-275 >> 101, 256 - before lunch:117, 183 (no metf.) >> 74-126 >> n/c >> 106-131 - 2h after lunch:84-172 (most 128-141) >> 222, 247 >> 110-114 - before dinner:  125, 155 >>  80-137 >> n/c >> 96-126, 157 - 2h post dinner: 119-207 (most 140-156) >> n/c >> 82-148 - b/t: 159-190 >> n/c >> 180 She has hypoglycemia awareness in the 80s.  No CKD: Lab Results  Component Value Date   BUN 10 04/27/2018   Lab Results  Component Value Date   CREATININE 0.85 04/27/2018   No MAU: Lab Results  Component Value Date   MICRALBCREAT 0.5 02/03/2017   MICRALBCREAT 0.7 12/20/2015   MICRALBCREAT 0.8 12/19/2014   MICRALBCREAT 0.2 01/19/2014   MICRALBCREAT 0.4  01/11/2013   MICRALBCREAT 25.1 11/20/2009   -No HL: Lab Results  Component Value Date   CHOL 161 02/03/2017   HDL 58.60 02/03/2017   LDLCALC 80 02/03/2017   TRIG 113.0 02/03/2017   CHOLHDL 3 02/03/2017   Last eye exam 01/2018: No DR - [facility] She has numbness and tingling in hand and feet. On Cymbalta, Neurontin >> feeling much better.  She had CP >> had a stress test 07/04/2015 >>  Normal.  On metoprolol to control her heart rate. Last year she was diagnosed with fibromyalgia.   She also has a history of iron deficiency anemia and had iron infusions. Before last visit, she just got out of a 5-year relationship.  She was under a lot of stress.  She has anemia.On po iron and occasional iv iron. Also B12 tablets 1000 mcg qod.  Review of Systems Constitutional: no weight gain/no weight loss, no fatigue, no subjective hyperthermia, no subjective hypothermia Eyes: no blurry vision, no xerophthalmia ENT: no sore throat, no nodules palpated in throat, no dysphagia, no odynophagia, no hoarseness Cardiovascular: no CP/no SOB/no palpitations/no leg swelling Respiratory: no cough/no SOB/no wheezing Gastrointestinal: no N/no V/no D/no C/no acid reflux Musculoskeletal: no muscle aches/no joint aches Skin: no rashes, no hair loss Neurological: no tremors/+ numbness/+ tingling/no dizziness  I reviewed pt's medications, allergies, PMH, social hx, family hx, and changes were documented in the history of present illness. Otherwise, unchanged from my initial visit note.  Past Medical History:  Diagnosis Date  . ADHD (attention deficit hyperactivity disorder)   . ALLERGIC RHINITIS   . Allergy   . Anemia   . ANXIETY   . Depression   . DIABETES MELLITUS, TYPE II   . Fibromyalgia 2018  . GERD (gastroesophageal reflux disease)   . MIGRAINE, COMMON   . Obesity, unspecified   . OVARIAN CYST    Past Surgical History:  Procedure Laterality Date  . NO PAST SURGERIES      Social History   Socioeconomic History  . Marital status: Single    Spouse name: Not on file  . Number of children: Not on file  . Years of education: Not on file  . Highest education level: Not on file  Occupational History  . Not on file  Social Needs  . Financial resource strain: Not on file  . Food insecurity:    Worry: Not on file    Inability: Not on file  . Transportation needs:    Medical: Not on file    Non-medical: Not on file  Tobacco Use  . Smoking status: Former Smoker    Last attempt to quit: 08/20/2002    Years since quitting: 15.7  . Smokeless tobacco: Never Used  . Tobacco comment: single, separated from spouse 02/2010.   Substance and Sexual Activity  . Alcohol use: Yes    Comment: rarely  . Drug use: No  . Sexual activity: Not Currently    Birth control/protection: IUD  Lifestyle  . Physical activity:    Days per week: Not on file    Minutes per session: Not on file  . Stress: Not on file  Relationships  . Social connections:    Talks on phone: Not on file    Gets together: Not on file    Attends religious service: Not on file    Active member of club or organization: Not on file    Attends meetings of clubs or organizations: Not on file    Relationship status: Not on file  . Intimate partner violence:    Fear of current or ex partner: Not on file    Emotionally abused: Not on file    Physically abused: Not on file    Forced sexual activity: Not on file  Other Topics Concern  . Not on file  Social History Narrative   Single, separated from spouse 02/2010   Current Outpatient Medications on File Prior to Visit  Medication Sig Dispense Refill  . almotriptan (AXERT) 12.5 MG tablet Take 1 tablet (12.5 mg total) by mouth as needed for migraine. may repeat in 2 hours if needed 12 tablet 3  . ALPRAZolam (XANAX) 0.5 MG tablet TAKE 1 TABLET(0.5 MG) BY MOUTH THREE TIMES DAILY AS NEEDED FOR ANXIETY 30 tablet 0  . amphetamine-dextroamphetamine  (ADDERALL XR) 20 MG 24 hr capsule Take 1 capsule (20 mg total) by mouth daily. 30 capsule 0  . Cyanocobalamin (B-12) 1000 MCG SUBL Place 1,000 mcg under the tongue daily. 30 each 3  . DULoxetine (CYMBALTA) 60 MG capsule Take 1 capsule (60 mg total) by mouth daily. 90 capsule 3  . fluticasone (FLONASE) 50 MCG/ACT nasal spray Place 2 sprays into both nostrils daily. 16 g 12  . gabapentin (NEURONTIN) 100 MG capsule Take 2 capsules (200 mg total) by mouth  at bedtime. 180 capsule 1  . glipiZIDE (GLUCOTROL XL) 5 MG 24 hr tablet Take 1 tablet (5 mg total) by mouth daily with breakfast. 90 tablet 3  . glucose blood (ONETOUCH VERIO) test strip Use 2x a day 200 each 3  . Insulin Glargine (LANTUS SOLOSTAR) 100 UNIT/ML Solostar Pen Inject 32 units at bedtime. 15 pen 1  . Insulin Pen Needle (BD PEN NEEDLE NANO U/F) 32G X 4 MM MISC USE ONCE A DAY 100 each 3  . iron polysaccharides (NIFEREX) 150 MG capsule Take 1 capsule (150 mg total) by mouth daily. 30 capsule 5  . lamoTRIgine (LAMICTAL) 100 MG tablet TAKE 1 TABLET BY MOUTH TWO  TIMES DAILY 180 tablet 1  . levonorgestrel (MIRENA) 20 MCG/24HR IUD 1 Intra Uterine Device (1 each total) by Intrauterine route once. 1 each 0  . metFORMIN (GLUCOPHAGE) 1000 MG tablet TAKE 1 TABLET BY  MOUTH TWICE A DAY WITH A  MEAL 180 tablet 3  . metoprolol succinate (TOPROL-XL) 25 MG 24 hr tablet TAKE ONE-HALF TABLET BY  MOUTH DAILY 45 tablet 1  . mupirocin cream (BACTROBAN) 2 % Apply 1 application topically 2 (two) times daily. 15 g 0  . omeprazole (PRILOSEC) 40 MG capsule TAKE 1 CAPSULE BY MOUTH  DAILY BEFORE SUPPER 90 capsule 3  . Prenatal Vit-Fe Fumarate-FA (MULTIVITAMIN-PRENATAL) 27-0.8 MG TABS tablet Take 1 tablet by mouth daily at 12 noon.    . promethazine (PHENERGAN) 25 MG tablet Take 25 mg by mouth every 6 (six) hours as needed for nausea.    . sitaGLIPtin (JANUVIA) 100 MG tablet Take 1 tablet (100 mg total) by mouth daily. 90 tablet 3  . traMADol-acetaminophen (ULTRACET)  37.5-325 MG per tablet Take 1 tablet by mouth every 6 (six) hours as needed for pain.    [name] triamcinolone cream (KENALOG) 0.1 % Apply topically 2 (two) times daily as needed. 30 g 1   No current facility-administered medications on file prior to visit.    Allergies  Allergen Reactions  . Propofol Shortness Of Breath    SOB, chest tightness, wheezing after colonoscopy on 09-18-15  . Chicken Allergy   . Gluten Meal Diarrhea  . Other     Any Nuts   . Victoza [Liraglutide] Rash   Family History  Problem Relation Age of Onset  . Diabetes Mother   . Hyperlipidemia Mother   . Diabetes Father   . Hyperlipidemia Father   . Coronary artery disease Father   . Hypertension Father   . Cancer Father        esoph  . Stroke Father   . Cancer Maternal Grandfather 59       Prostate and Lung  . Diabetes Sister        Gestational  . Berenice Primas' disease Maternal Aunt   . Cancer Maternal Uncle        Brain  . Stroke Paternal Grandmother   . Heart disease Paternal Grandmother   . Stroke Paternal Grandfather   . Heart disease Paternal Grandfather   . Diabetes Sister        Gestational and Type II  . Hashimoto's thyroiditis Cousin   . Cancer Maternal Uncle        Brain      Objective:   Physical Exam BP 122/86   Pulse (!) 107   Ht 5\' 4"  (1.626 m)   Wt 229 lb (103.9 kg)   SpO2 97%   BMI 39.31 kg/m    Body mass index  is 39.31 kg/m. Wt Readings from Last 3 Encounters:  05/27/18 229 lb (103.9 kg)  04/27/18 229 lb (103.9 kg)  04/06/18 228 lb (103.4 kg)   Constitutional: overweight, in NAD Eyes: PERRLA, EOMI, no exophthalmos ENT: moist mucous membranes, no thyromegaly, no cervical lymphadenopathy Cardiovascular: tachycardia, RR, No MRG Respiratory: CTA B Gastrointestinal: abdomen soft, NT, ND, BS+ Musculoskeletal: no deformities, strength intact in all 4 Skin: moist, warm, no rashes Neurological: no tremor with outstretched hands, DTR normal in all 4   Assessment:     1. DM2,  insulin-dependent, uncontrolled, without complications - r/o autoimmunity Component     Latest Ref Rng 08/17/2012  Glutamic Acid Decarb Ab     <=1.0 U/mL <1.0  Pancreatic Islet Cell Antibody     < 5 JDF Units <5  C-Peptide     0.80 - 3.90 ng/mL 1.84  Glucose     70 - 99 mg/dL 104 (H)     2. Obesity class II BMI Classification:  < 18.5 underweight   18.5-24.9 normal weight   25.0-29.9 overweight   30.0-34.9 class I obesity   35.0-39.9 class II obesity   ? 40.0 class III obesity    Plan:     1. Patient with long standing, uncontrolled DM2, on a complex med regimen with po meds and basal insulin.  At last visit, she returned after periods of depression and forgetting diabetic medication doses.  Her HbA1c was very high at that time, at 9.4%.  She was not checking sugars.  I strongly advised her to start checking sugars and take her medicines consistently.  We did not change the regimen at that time.  At this visit, she is feeling better, she is more compliant with the diet and started exercise.  She is also sleeping better. - reviewing her meter download, sugars are MUCH better, almost all at goal >> we will not change her regimen for now, but at next visit, if she continues to do as well, will need less insulin and maybe we can also stop her Glipizide - I suggested to:  Patient Instructions  Please continue: - Lantus 32 units at bedtime - Metformin 1000 mg 2x a day - Januvia 100 mg in am - Glipizide ER 5 mg before b'fast  Please return in 4 months with your sugar log.   - today, HbA1c is 7.2% (much better) - continue checking sugars at different times of the day - check 1x a day, rotating checks - advised for yearly eye exams >> she is UTD - Return to clinic in 4 mo with sugar log    2. Obesity class II - metformin is helping with reducing appetite - weight is a little higher( +4lbs)  - continue exercise   Philemon Kingdom, MD PhD [facility]

## 2018-05-27 NOTE — Addendum Note (Signed)
Addended by: Cardell Peach I on: 05/27/2018 11:18 AM   Modules accepted: Orders

## 2018-05-27 NOTE — Patient Instructions (Signed)
Please continue: - Lantus 32 units at bedtime - Metformin 1000 mg 2x a day - Januvia 100 mg in am - Glipizide ER 5 mg before b'fast  Please return in 4 months with your sugar log.

## 2018-06-09 ENCOUNTER — Other Ambulatory Visit: Payer: Self-pay

## 2018-06-09 MED ORDER — INSULIN GLARGINE 100 UNIT/ML SOLOSTAR PEN: PEN_INJECTOR | SUBCUTANEOUS | 1 refills | Status: DC

## 2018-06-09 MED ORDER — INSULIN PEN NEEDLE 32G X 4 MM MISC: 3 refills | Status: DC

## 2018-06-19 ENCOUNTER — Other Ambulatory Visit: Payer: Self-pay

## 2018-06-19 ENCOUNTER — Emergency Department (HOSPITAL_BASED_OUTPATIENT_CLINIC_OR_DEPARTMENT_OTHER): Payer: 59

## 2018-06-19 ENCOUNTER — Emergency Department (HOSPITAL_BASED_OUTPATIENT_CLINIC_OR_DEPARTMENT_OTHER)
Admission: EM | Admit: 2018-06-19 | Discharge: 2018-06-20 | Disposition: A | Discharge status: Home / Self Care | Payer: 59 | Attending: Emergency Medicine | Admitting: Emergency Medicine

## 2018-06-19 ENCOUNTER — Encounter (HOSPITAL_BASED_OUTPATIENT_CLINIC_OR_DEPARTMENT_OTHER): Payer: Self-pay | Admitting: Adult Health

## 2018-06-19 DIAGNOSIS — N200 Calculus of kidney: Secondary | ICD-10-CM

## 2018-06-19 DIAGNOSIS — Z87891 Personal history of nicotine dependence: Secondary | ICD-10-CM | POA: Diagnosis not present

## 2018-06-19 DIAGNOSIS — E119 Type 2 diabetes mellitus without complications: Secondary | ICD-10-CM | POA: Insufficient documentation

## 2018-06-19 DIAGNOSIS — R69 Illness, unspecified: Secondary | ICD-10-CM | POA: Diagnosis not present

## 2018-06-19 DIAGNOSIS — F419 Anxiety disorder, unspecified: Secondary | ICD-10-CM | POA: Diagnosis not present

## 2018-06-19 DIAGNOSIS — N132 Hydronephrosis with renal and ureteral calculous obstruction: Secondary | ICD-10-CM | POA: Diagnosis not present

## 2018-06-19 DIAGNOSIS — Z79899 Other long term (current) drug therapy: Secondary | ICD-10-CM | POA: Insufficient documentation

## 2018-06-19 DIAGNOSIS — R112 Nausea with vomiting, unspecified: Secondary | ICD-10-CM | POA: Diagnosis not present

## 2018-06-19 DIAGNOSIS — R1031 Right lower quadrant pain: Secondary | ICD-10-CM | POA: Diagnosis present

## 2018-06-19 LAB — URINALYSIS, ROUTINE W REFLEX MICROSCOPIC
Glucose, UA: NEGATIVE mg/dL
Ketones, ur: >80 mg/dL — AB
Leukocytes, UA: NEGATIVE
NITRITE: NEGATIVE
PH: 6.5 (ref 5.0–8.0)
Protein, ur: 30 mg/dL — AB
SPECIFIC GRAVITY, URINE: 1.025 (ref 1.005–1.030)

## 2018-06-19 LAB — BASIC METABOLIC PANEL
Anion gap: 15 (ref 5–15)
BUN: 8 mg/dL (ref 6–20)
CHLORIDE: 101 mmol/L (ref 98–111)
CO2: 21 mmol/L — AB (ref 22–32)
CREATININE: 0.95 mg/dL (ref 0.44–1.00)
Calcium: 9.7 mg/dL (ref 8.9–10.3)
GFR calc Af Amer: >60 mL/min (ref >60)
GFR calc non Af Amer: >60 mL/min (ref >60)
GLUCOSE: 221 mg/dL — AB (ref 70–99)
POTASSIUM: 3.6 mmol/L (ref 3.5–5.1)
Sodium: 137 mmol/L (ref 135–145)

## 2018-06-19 LAB — CBC WITH DIFFERENTIAL/PLATELET
Abs Immature Granulocytes: 0.05 10*3/uL (ref 0.00–0.07)
BASOS PCT: 0 %
Basophils Absolute: 0.1 10*3/uL (ref 0.0–0.1)
EOS PCT: 1 %
Eosinophils Absolute: 0.1 10*3/uL (ref 0.0–0.5)
HCT: 42.2 % (ref 36.0–46.0)
HEMOGLOBIN: 13.3 g/dL (ref 12.0–15.0)
Immature Granulocytes: 0 %
Lymphocytes Relative: 14 %
Lymphs Abs: 1.8 10*3/uL (ref 0.7–4.0)
MCH: 26.2 pg (ref 26.0–34.0)
MCHC: 31.5 g/dL (ref 30.0–36.0)
MCV: 83.1 fL (ref 80.0–100.0)
MONO ABS: 0.9 10*3/uL (ref 0.1–1.0)
Monocytes Relative: 7 %
Neutro Abs: 10 10*3/uL — ABNORMAL HIGH (ref 1.7–7.7)
Neutrophils Relative %: 78 %
Platelets: 407 10*3/uL — ABNORMAL HIGH (ref 150–400)
RBC: 5.08 MIL/uL (ref 3.87–5.11)
RDW: 15.2 % (ref 11.5–15.5)
WBC: 12.9 10*3/uL — AB (ref 4.0–10.5)
nRBC: 0 % (ref 0.0–0.2)

## 2018-06-19 LAB — URINALYSIS, MICROSCOPIC (REFLEX)

## 2018-06-19 LAB — PREGNANCY, URINE: PREG TEST UR: NEGATIVE

## 2018-06-19 MED ORDER — SODIUM CHLORIDE 0.9 % IV BOLUS
1000.0000 mL | Freq: Once | INTRAVENOUS | Status: AC
  Administered 2018-06-19: 1000 mL via INTRAVENOUS

## 2018-06-19 MED ORDER — ONDANSETRON HCL 4 MG/2ML IJ SOLN
4.0000 mg | Freq: Once | INTRAMUSCULAR | Status: AC
  Administered 2018-06-19: 4 mg via INTRAVENOUS
  Filled 2018-06-19: qty 2

## 2018-06-19 MED ORDER — HYDROMORPHONE HCL 1 MG/ML IJ SOLN
1.0000 mg | Freq: Once | INTRAMUSCULAR | Status: AC
  Administered 2018-06-19: 1 mg via INTRAVENOUS
  Filled 2018-06-19: qty 1

## 2018-06-19 MED ORDER — KETOROLAC TROMETHAMINE 30 MG/ML IJ SOLN
30.0000 mg | Freq: Once | INTRAMUSCULAR | Status: AC
  Administered 2018-06-19: 30 mg via INTRAVENOUS
  Filled 2018-06-19: qty 1

## 2018-06-19 NOTE — ED Provider Notes (Signed)
[facility] EMERGENCY DEPARTMENT Provider Note   CSN: [id number] Arrival date & time: 06/19/18  2222     History   Chief Complaint Chief Complaint  Patient presents with  . Flank Pain    HPI Lisa Duran is a 37 y.o. female.  HPI   4PM began to have RL abdominal pain radiating to the back Nausea, Throwing up Severe 10/10 pain. Cannot get comfortable No hx of kidney stones, does have hx of ovarian cysts Nausea, and vomiting, 4 times No diarrhea or constipation No fever No dysuria, no vaginal discharge or bleeding No hx of pain like this   Past Medical History:  Diagnosis Date  . ADHD (attention deficit hyperactivity disorder)   . ALLERGIC RHINITIS   . Allergy   . Anemia   . ANXIETY   . Depression   . DIABETES MELLITUS, TYPE II   . Fibromyalgia 2018  . GERD (gastroesophageal reflux disease)   . MIGRAINE, COMMON   . Obesity, unspecified   . OVARIAN CYST     Patient Active Problem List   Diagnosis Date Noted  . Strain of Achilles tendon, right, initial encounter 01/01/2018  . Sleep-related headache 04/06/2017  . Sleep walking disorder 04/06/2017  . OSA (obstructive sleep apnea) 04/06/2017  . Iron deficiency anemia 03/27/2017  . Arthralgia 02/03/2017  . Fibromyalgia 02/03/2017  . GERD (gastroesophageal reflux disease) 06/24/2015  . Eczema 06/22/2015  . Depression 06/22/2015  . Tachycardia 06/16/2014  . Paresthesias 06/12/2014  . Food allergy   . Wrist pain, right 09/14/2013  . Elbow pain, right 09/05/2013  . ADHD (attention deficit hyperactivity disorder) 01/27/2011  . Anxiety 02/20/2010  . ALLERGIC RHINITIS 11/21/2009  . OVARIAN CYST 11/21/2009  . Diabetes mellitus type 2 with neurological manifestations ([place]) 11/20/2009  . Class 2 obesity in adult 11/20/2009  . Migraine without aura 11/20/2009    Past Surgical History:  Procedure Laterality Date  . NO PAST SURGERIES       OB History   None      Home Medications    Prior to  Admission medications   Medication Sig Start Date End Date Taking? Authorizing Provider  almotriptan (AXERT) 12.5 MG tablet Take 1 tablet (12.5 mg total) by mouth as needed for migraine. may repeat in 2 hours if needed 04/21/17   Binnie Rail, MD  ALPRAZolam (XANAX) 0.5 MG tablet TAKE 1 TABLET(0.5 MG) BY MOUTH THREE TIMES DAILY AS NEEDED FOR ANXIETY 12/01/17   Burns, Claudina Lick, MD  amphetamine-dextroamphetamine (ADDERALL XR) 20 MG 24 hr capsule Take 1 capsule (20 mg total) by mouth daily. 03/21/18   Binnie Rail, MD  Cyanocobalamin (B-12) 1000 MCG SUBL Place 1,000 mcg under the tongue daily. 05/27/17   Brunetta Genera, MD  DULoxetine (CYMBALTA) 60 MG capsule Take 1 capsule (60 mg total) by mouth daily. 10/07/17   Binnie Rail, MD  fluticasone (FLONASE) 50 MCG/ACT nasal spray Place 2 sprays into both nostrils daily. 10/07/17   Binnie Rail, MD  gabapentin (NEURONTIN) 100 MG capsule Take 2 capsules (200 mg total) by mouth at bedtime. 01/18/18   Binnie Rail, MD  glipiZIDE (GLUCOTROL XL) 5 MG 24 hr tablet Take 1 tablet (5 mg total) by mouth daily with breakfast. 01/22/18   Philemon Kingdom, MD  glucose blood (ONETOUCH VERIO) test strip Use 2x a day 01/22/18   Philemon Kingdom, MD  Insulin Glargine (LANTUS SOLOSTAR) 100 UNIT/ML Solostar Pen Inject 32 units at bedtime. 06/09/18   Gherghe,  Salena Saner, MD  Insulin Pen Needle (BD PEN NEEDLE NANO U/F) 32G X 4 MM MISC USE ONCE A DAY 06/09/18   Philemon Kingdom, MD  iron polysaccharides (NIFEREX) 150 MG capsule Take 1 capsule (150 mg total) by mouth daily. 05/27/17   Brunetta Genera, MD  lamoTRIgine (LAMICTAL) 100 MG tablet TAKE 1 TABLET BY MOUTH TWO  TIMES DAILY 08/05/16   Binnie Rail, MD  levonorgestrel (MIRENA) 20 MCG/24HR IUD 1 Intra Uterine Device (1 each total) by Intrauterine route once. 10/03/13   Rowe Clack, MD  metFORMIN (GLUCOPHAGE) 1000 MG tablet TAKE 1 TABLET BY  MOUTH TWICE A DAY WITH A  MEAL 01/22/18   Philemon Kingdom, MD    metoprolol succinate (TOPROL-XL) 25 MG 24 hr tablet TAKE ONE-HALF TABLET BY  MOUTH DAILY 01/26/18   Binnie Rail, MD  mupirocin cream (BACTROBAN) 2 % Apply 1 application topically 2 (two) times daily. 02/23/18   Marrian Salvage, FNP  omeprazole (PRILOSEC) 40 MG capsule TAKE 1 CAPSULE BY MOUTH  DAILY BEFORE SUPPER 04/09/18   Burns, Claudina Lick, MD  ondansetron (ZOFRAN ODT) 4 MG disintegrating tablet Take 1 tablet (4 mg total) by mouth every 8 (eight) hours as needed for nausea or vomiting. 06/20/18   Gareth Morgan, MD  oxyCODONE (ROXICODONE) 5 MG immediate release tablet Take 1 tablet (5 mg total) by mouth every 4 (four) hours as needed for severe pain. 06/20/18   Gareth Morgan, MD  Prenatal Vit-Fe Fumarate-FA (MULTIVITAMIN-PRENATAL) 27-0.8 MG TABS tablet Take 1 tablet by mouth daily at 12 noon.    [provider]  promethazine (PHENERGAN) 25 MG tablet Take 25 mg by mouth every 6 (six) hours as needed for nausea. 01/27/11   Rowe Clack, MD  sitaGLIPtin (JANUVIA) 100 MG tablet Take 1 tablet (100 mg total) by mouth daily. 01/22/18   Philemon Kingdom, MD  tamsulosin (FLOMAX) 0.4 MG CAPS capsule Take 1 capsule (0.4 mg total) by mouth daily for 14 days. 06/20/18 07/04/18  Gareth Morgan, MD  traMADol-acetaminophen (ULTRACET) 37.5-325 MG per tablet Take 1 tablet by mouth every 6 (six) hours as needed for pain. 01/26/12   Rowe Clack, MD  triamcinolone cream (KENALOG) 0.1 % Apply topically 2 (two) times daily as needed. 06/20/14   Rowe Clack, MD    Family History Family History  Problem Relation Age of Onset  . Diabetes Mother   . Hyperlipidemia Mother   . Diabetes Father   . Hyperlipidemia Father   . Coronary artery disease Father   . Hypertension Father   . Cancer Father        esoph  . Stroke Father   . Cancer Maternal Grandfather 54       Prostate and Lung  . Diabetes Sister        Gestational  . Berenice Primas' disease Maternal Aunt   . Cancer Maternal  Uncle        Brain  . Stroke Paternal Grandmother   . Heart disease Paternal Grandmother   . Stroke Paternal Grandfather   . Heart disease Paternal Grandfather   . Diabetes Sister        Gestational and Type II  . Hashimoto's thyroiditis Cousin   . Cancer Maternal Uncle        Brain    Social History Social History   Tobacco Use  . Smoking status: Former Smoker    Last attempt to quit: 08/20/2002    Years since quitting: 15.8  . Smokeless  tobacco: Never Used  . Tobacco comment: single, separated from spouse 02/2010.   Substance Use Topics  . Alcohol use: Yes    Comment: rarely  . Drug use: No     Allergies   Propofol; Chicken allergy; Gluten meal; Other; and Victoza [liraglutide]   Review of Systems Review of Systems  Constitutional: Negative for fever.  HENT: Negative for sore throat.   Eyes: Negative for visual disturbance.  Respiratory: Negative for cough and shortness of breath.   Cardiovascular: Negative for chest pain.  Gastrointestinal: Positive for abdominal pain, nausea and vomiting. Negative for constipation and diarrhea.  Genitourinary: Positive for difficulty urinating and flank pain. Negative for dysuria.  Musculoskeletal: Positive for back pain. Negative for neck pain.  Skin: Negative for rash.  Neurological: Negative for syncope, weakness, numbness and headaches.     Physical Exam Updated Vital Signs BP (!) 134/92   Pulse 70   Temp 97.8 F (36.6 C) (Oral)   Resp 14   Ht 5\' 4"  (1.626 m)   Wt 103 kg   SpO2 98%   BMI 38.98 kg/m   Physical Exam  Constitutional: She is oriented to person, place, and time. She appears well-developed and well-nourished. No distress.  HENT:  Head: Normocephalic and atraumatic.  Eyes: Conjunctivae and EOM are normal.  Neck: Normal range of motion.  Cardiovascular: Normal rate, regular rhythm, normal heart sounds and intact distal pulses. Exam reveals no gallop and no friction rub.  No murmur  heard. Pulmonary/Chest: Effort normal and breath sounds normal. No respiratory distress. She has no wheezes. She has no rales.  Abdominal: Soft. She exhibits no distension. There is tenderness (mild right sided, severe CVA tenderness). There is no guarding.  Musculoskeletal: She exhibits no edema or tenderness.  Neurological: She is alert and oriented to person, place, and time.  Skin: Skin is warm and dry. No rash noted. She is not diaphoretic. No erythema.  Nursing note and vitals reviewed.    ED Treatments / Results  Labs (all labs ordered are listed, but only abnormal results are displayed) Labs Reviewed  CBC WITH DIFFERENTIAL/PLATELET - Abnormal; Notable for the following components:      Result Value   WBC 12.9 (*)    Platelets 407 (*)    Neutro Abs 10.0 (*)    All other components within normal limits  BASIC METABOLIC PANEL - Abnormal; Notable for the following components:   CO2 21 (*)    Glucose, Bld 221 (*)    All other components within normal limits  URINALYSIS, ROUTINE W REFLEX MICROSCOPIC - Abnormal; Notable for the following components:   Hgb urine dipstick MODERATE (*)    Bilirubin Urine SMALL (*)    Ketones, ur >80 (*)    Protein, ur 30 (*)    All other components within normal limits  URINALYSIS, MICROSCOPIC (REFLEX) - Abnormal; Notable for the following components:   Bacteria, UA MANY (*)    All other components within normal limits  PREGNANCY, URINE    EKG None  Radiology Ct Renal Stone Study  Result Date: 06/20/2018 CLINICAL DATA:  Acute onset of right flank pain and right lower quadrant abdominal pain. Bladder incontinence. EXAM: CT ABDOMEN AND PELVIS WITHOUT CONTRAST TECHNIQUE: Multidetector CT imaging of the abdomen and pelvis was performed following the standard protocol without IV contrast. COMPARISON:  CT of the abdomen and pelvis from 07/19/2014 FINDINGS: Lower chest: Minimal right basilar atelectasis is noted. The visualized portions of the  mediastinum are unremarkable. Trace  fluid is noted at the distal esophagus. Hepatobiliary: Diffuse fatty infiltration is noted within the liver, with mild sparing about the gallbladder fossa. The liver is otherwise unremarkable. The gallbladder is within normal limits. The common bile duct is normal in caliber. Pancreas: The pancreas is within normal limits. Spleen: The spleen is unremarkable in appearance. Adrenals/Urinary Tract: The adrenal glands are unremarkable in appearance. There is minimal right-sided hydronephrosis, with prominence of the right ureter along its entire course. Two small obstructing stones are noted at the distal right ureter, just above the right vesicoureteral junction, each measuring 2 mm. No nonobstructing renal stones are identified. The left kidney is unremarkable in appearance. No perinephric stranding is seen. Stomach/Bowel: The stomach is unremarkable in appearance. The small bowel is within normal limits. The appendix is normal in caliber, without evidence of appendicitis. The colon is unremarkable in appearance. Vascular/Lymphatic: The abdominal aorta is unremarkable in appearance. The inferior vena cava is grossly unremarkable. No retroperitoneal lymphadenopathy is seen. No pelvic sidewall lymphadenopathy is identified. Reproductive: The bladder is decompressed and not well characterized. The uterus is grossly unremarkable, with an intrauterine device noted in expected position at the fundus of the uterus. The ovaries are relatively symmetric. No suspicious adnexal masses are seen. Other: No additional soft tissue abnormalities are seen. Musculoskeletal: No acute osseous abnormalities are identified. Chronic bilateral pars defects are seen at L5, without evidence of anterolisthesis. A tiny limbus vertebra is noted at the superior endplate of L4. The visualized musculature is unremarkable in appearance. IMPRESSION: 1. Minimal right-sided hydronephrosis, with two small obstructing  stones at the distal right ureter, just above the right vesicoureteral junction, each measuring 2 mm. 2. Diffuse fatty infiltration within the liver. 3. Chronic bilateral pars defects at L5, without evidence of anterolisthesis. Electronically Signed   By: Garald Balding M.D.   On: 06/20/2018 00:06    Procedures Procedures (including critical care time)  Medications Ordered in ED Medications  HYDROmorphone (DILAUDID) injection 1 mg (1 mg Intravenous Given 06/19/18 2244)  ondansetron (ZOFRAN) injection 4 mg (4 mg Intravenous Given 06/19/18 2244)  ketorolac (TORADOL) 30 MG/ML injection 30 mg (30 mg Intravenous Given 06/19/18 2326)  sodium chloride 0.9 % bolus 1,000 mL (0 mLs Intravenous Stopped 06/20/18 0035)     Initial Impression / Assessment and Plan / ED Course  I have reviewed the triage vital signs and the nursing notes.  Pertinent labs & imaging results that were available during my care of the patient were reviewed by me and considered in my medical decision making (see chart for details).     37yo female presents with concern for colicky right lower quadrant and right flank pain. Preg negative.  CT stone study shows 2 86mm obstructing right stones near UVJ.  Urinalysis without signs of infection. Improved with fluids, toradol, zofran. Given rx for flomax, rec tylenol, ibuprofen, hydration, oxycodone given for breakthrough pain. Patient discharged in stable condition with understanding of reasons to return.   Final Clinical Impressions(s) / ED Diagnoses   Final diagnoses:  Right nephrolithiasis    ED Discharge Orders         Ordered    ondansetron (ZOFRAN ODT) 4 MG disintegrating tablet  Every 8 hours PRN     06/20/18 0049    tamsulosin (FLOMAX) 0.4 MG CAPS capsule  Daily     06/20/18 0049    oxyCODONE (ROXICODONE) 5 MG immediate release tablet  Every 4 hours PRN     06/20/18 0049  Gareth Morgan, MD 06/20/18 1312

## 2018-06-19 NOTE — ED Triage Notes (Signed)
PResents with severe right sided flabnk pain and lower right abdominal pain that began at 4:30 and has progressed. At 7 pm she endorses an episode of bladder incontinence which has never happened and since then the pain is severe. Pt is unable to urinate.

## 2018-06-19 NOTE — ED Notes (Signed)
ED Provider at bedside. 

## 2018-06-19 NOTE — ED Notes (Signed)
Pt made aware that urine specimen is needed. Unable to provide specimen at this time.

## 2018-06-20 MED ORDER — ONDANSETRON 4 MG PO TBDP: 4.0000 mg | ORAL_TABLET | Freq: Three times a day (TID) | ORAL | 0 refills | Status: DC | PRN

## 2018-06-20 MED ORDER — TAMSULOSIN HCL 0.4 MG PO CAPS: 0.4000 mg | ORAL_CAPSULE | Freq: Every day | ORAL | 0 refills | Status: AC

## 2018-06-20 MED ORDER — OXYCODONE HCL 5 MG PO TABS: 5.0000 mg | ORAL_TABLET | ORAL | 0 refills | Status: DC | PRN

## 2018-06-20 NOTE — Discharge Instructions (Signed)
Take Tylenol 1000 mg 4 times a day for 1 week. This is the maximum dose of Tylenol (acetaminophen) you can take from all sources. Please check other over-the-counter medications and prescriptions to ensure you are not taking other medications that contain acetaminophen.  You may also take ibuprofen 400 mg 6 times a day alternating with or at the same time as tylenol.  Take oxycodone as needed for breakthrough pain.  This medication can be addicting, sedating and cause constipation.   °

## 2018-06-20 NOTE — ED Notes (Signed)
ED Provider at bedside. 

## 2018-07-12 ENCOUNTER — Encounter: Payer: Self-pay | Admitting: Internal Medicine

## 2018-07-12 MED ORDER — ALPRAZOLAM 0.5 MG PO TABS: ORAL_TABLET | ORAL | 0 refills | Status: DC

## 2018-07-12 MED ORDER — AMPHETAMINE-DEXTROAMPHET ER 20 MG PO CP24: 20.0000 mg | ORAL_CAPSULE | Freq: Every day | ORAL | 0 refills | Status: DC

## 2018-07-13 ENCOUNTER — Ambulatory Visit (INDEPENDENT_AMBULATORY_CARE_PROVIDER_SITE_OTHER): Payer: 59

## 2018-07-13 ENCOUNTER — Ambulatory Visit: Payer: Self-pay

## 2018-07-13 DIAGNOSIS — Z23 Encounter for immunization: Secondary | ICD-10-CM

## 2018-08-02 ENCOUNTER — Encounter: Payer: Self-pay | Admitting: Family Medicine

## 2018-08-02 ENCOUNTER — Ambulatory Visit: Payer: 59 | Admitting: Family Medicine

## 2018-08-02 ENCOUNTER — Ambulatory Visit (INDEPENDENT_AMBULATORY_CARE_PROVIDER_SITE_OTHER): Payer: 59

## 2018-08-02 VITALS — BP 158/94 | HR 145 | Temp 98.7°F | Ht 64.0 in | Wt 220.0 lb

## 2018-08-02 DIAGNOSIS — R05 Cough: Secondary | ICD-10-CM | POA: Diagnosis not present

## 2018-08-02 DIAGNOSIS — R0602 Shortness of breath: Secondary | ICD-10-CM | POA: Diagnosis not present

## 2018-08-02 DIAGNOSIS — R079 Chest pain, unspecified: Secondary | ICD-10-CM | POA: Diagnosis not present

## 2018-08-02 DIAGNOSIS — R059 Cough, unspecified: Secondary | ICD-10-CM | POA: Insufficient documentation

## 2018-08-02 MED ORDER — ONDANSETRON 4 MG PO TBDP: 4.0000 mg | ORAL_TABLET | Freq: Three times a day (TID) | ORAL | 0 refills | Status: DC | PRN

## 2018-08-02 MED ORDER — ALBUTEROL SULFATE HFA 108 (90 BASE) MCG/ACT IN AERS: 2.0000 | INHALATION_SPRAY | Freq: Four times a day (QID) | RESPIRATORY_TRACT | 0 refills | Status: DC | PRN

## 2018-08-02 MED ORDER — AMOXICILLIN-POT CLAVULANATE ER 1000-62.5 MG PO TB12: 2.0000 | ORAL_TABLET | Freq: Two times a day (BID) | ORAL | 0 refills | Status: AC

## 2018-08-02 MED ORDER — AZITHROMYCIN 250 MG PO TABS: ORAL_TABLET | ORAL | 0 refills | Status: DC

## 2018-08-02 NOTE — Progress Notes (Signed)
FYI:  Spoke with patient and given message.  She is aware of precautions and to go to hospital if nausea is not improving of symptoms worsen.

## 2018-08-02 NOTE — Progress Notes (Signed)
Lisa Duran - 37 y.o. female MRN [id number]  Date of birth: [birth year]  Subjective Chief Complaint  Patient presents with  . Cough    present for one week-has had fevers and sore throat    HPI Lisa Duran is a 37 y.o. female with history of T2DM, ADHD, and tachycardia here today with complaint of cough.  She reports that symptoms started 1 week ago and have been progressively worsening since that time.  She has had associated congestion,  tightness in her chest with wheezing and shortness of breath.  She has also had sore throat and intermittent fevers at home.  She has been unable to take medications recently due difficulty swallowing, nausea and shortness of breath.  She does have some pain on the R side of her chest.  She has had pneumonia and flu vaccine.  She denies sinus pain, headache, diarrhea, rash.   ROS:  A comprehensive ROS was completed and negative except as noted per HPI  Allergies  Allergen Reactions  . Propofol Shortness Of Breath    SOB, chest tightness, wheezing after colonoscopy on 09-18-15  . Chicken Allergy   . Gluten Meal Diarrhea  . Other     Any Nuts   . Victoza [Liraglutide] Rash    Past Medical History:  Diagnosis Date  . ADHD (attention deficit hyperactivity disorder)   . ALLERGIC RHINITIS   . Allergy   . Anemia   . ANXIETY   . Depression   . DIABETES MELLITUS, TYPE II   . Fibromyalgia 2018  . GERD (gastroesophageal reflux disease)   . MIGRAINE, COMMON   . Obesity, unspecified   . OVARIAN CYST     Past Surgical History:  Procedure Laterality Date  . NO PAST SURGERIES      Social History   Socioeconomic History  . Marital status: Single    Spouse name: Not on file  . Number of children: Not on file  . Years of education: Not on file  . Highest education level: Not on file  Occupational History  . Not on file  Social Needs  . Financial resource strain: Not on file  . Food insecurity:    Worry: Not on file    Inability: Not on file    . Transportation needs:    Medical: Not on file    Non-medical: Not on file  Tobacco Use  . Smoking status: Former Smoker    Last attempt to quit: 08/20/2002    Years since quitting: 15.9  . Smokeless tobacco: Never Used  . Tobacco comment: single, separated from spouse 02/2010.   Substance and Sexual Activity  . Alcohol use: Yes    Comment: rarely  . Drug use: No  . Sexual activity: Not Currently    Birth control/protection: I.U.D.  Lifestyle  . Physical activity:    Days per week: Not on file    Minutes per session: Not on file  . Stress: Not on file  Relationships  . Social connections:    Talks on phone: Not on file    Gets together: Not on file    Attends religious service: Not on file    Active member of club or organization: Not on file    Attends meetings of clubs or organizations: Not on file    Relationship status: Not on file  Other Topics Concern  . Not on file  Social History Narrative   Single, separated from spouse 02/2010    Family History  Problem Relation  Age of Onset  . Diabetes Mother   . Hyperlipidemia Mother   . Diabetes Father   . Hyperlipidemia Father   . Coronary artery disease Father   . Hypertension Father   . Cancer Father        esoph  . Stroke Father   . Cancer Maternal Grandfather 51       Prostate and Lung  . Diabetes Sister        Gestational  . Berenice Primas' disease Maternal Aunt   . Cancer Maternal Uncle        Brain  . Stroke Paternal Grandmother   . Heart disease Paternal Grandmother   . Stroke Paternal Grandfather   . Heart disease Paternal Grandfather   . Diabetes Sister        Gestational and Type II  . Hashimoto's thyroiditis Cousin   . Cancer Maternal Uncle        Brain    Health Maintenance  Topic Date Due  . FOOT EXAM  01/11/2014  . PAP SMEAR-Modifier  10/11/2017  . URINE MICROALBUMIN  02/03/2018  . HEMOGLOBIN A1C  11/26/2018  . OPHTHALMOLOGY EXAM  01/29/2019  . TETANUS/TDAP  02/24/2028  . INFLUENZA VACCINE   Completed  . PNEUMOCOCCAL POLYSACCHARIDE VACCINE AGE 75-64 HIGH RISK  Completed  . HIV Screening  Completed    ----------------------------------------------------------------------------------------------------------------------------------------------------------------------------------------------------------------- Physical Exam BP (!) 158/94   Pulse (!) 145   Temp 98.7 F (37.1 C) (Oral)   Ht 5\' 4"  (1.626 m)   Wt 220 lb (99.8 kg)   SpO2 95%   BMI 37.76 kg/m   Physical Exam Constitutional:      Appearance: Normal appearance.  HENT:     Head: Normocephalic and atraumatic.     Right Ear: Tympanic membrane and ear canal normal.     Left Ear: Tympanic membrane and ear canal normal.     Mouth/Throat:     Mouth: Mucous membranes are moist.     Pharynx: Oropharynx is clear. No oropharyngeal exudate or posterior oropharyngeal erythema.  Eyes:     General: No scleral icterus. Neck:     Musculoskeletal: Neck supple.  Cardiovascular:     Rate and Rhythm: Tachycardia present.     Pulses: Normal pulses.  Pulmonary:     Effort: Pulmonary effort is normal.     Breath sounds: Rhonchi present.     Comments: Scattered rhonchi and diminished breath sounds in RML and RLL Lymphadenopathy:     Cervical: No cervical adenopathy.  Skin:    General: Skin is warm and dry.     Findings: No rash.  Neurological:     General: No focal deficit present.     Mental Status: She is alert.  Psychiatric:        Mood and Affect: Mood normal.        Behavior: Behavior normal.     ------------------------------------------------------------------------------------------------------------------------------------------------------------------------------------------------------------------- Assessment and Plan  Cough -History and exam consistent with RML/RLL PNA. -CXR requested -Will cover for CAP with augmentin and azithromycin with history of T2DM -Rx for albuterol but would prefer that she  restart metoprolol before using this -Zofran ODT for nausea. -F/u in 3-4 days.

## 2018-08-02 NOTE — Patient Instructions (Signed)
Community-Acquired Pneumonia, Adult  Pneumonia is an infection of the lungs. It causes swelling in the airways of the lungs. Mucus and fluid may also build up inside the airways.  One type of pneumonia can happen while a person is in a hospital. A different type can happen when a person is not in a hospital (community-acquired pneumonia).   What are the causes?    This condition is caused by germs (viruses, bacteria, or fungi). Some types of germs can be passed from one person to another. This can happen when you breathe in droplets from the cough or sneeze of an infected person.  What increases the risk?  You are more likely to develop this condition if you:   Have a long-term (chronic) disease, such as:  ? Chronic obstructive pulmonary disease (COPD).  ? Asthma.  ? Cystic fibrosis.  ? Congestive heart failure.  ? Diabetes.  ? Kidney disease.   Have HIV.   Have sickle cell disease.   Have had your spleen removed.   Do not take good care of your teeth and mouth (poor dental hygiene).   Have a medical condition that increases the risk of breathing in droplets from your own mouth and nose.   Have a weakened body defense system (immune system).   Are a smoker.   Travel to areas where the germs that cause this illness are common.   Are around certain animals or the places they live.  What are the signs or symptoms?   A dry cough.   A wet (productive) cough.   Fever.   Sweating.   Chest pain. This often happens when breathing deeply or coughing.   Fast breathing or trouble breathing.   Shortness of breath.   Shaking chills.   Feeling tired (fatigue).   Muscle aches.  How is this treated?  Treatment for this condition depends on many things. Most adults can be treated at home. In some cases, treatment must happen in a hospital. Treatment may include:   Medicines given by mouth or through an IV tube.   Being given extra oxygen.   Respiratory therapy.  In rare cases, treatment for very bad pneumonia  may include:   Using a machine to help you breathe.   Having a procedure to remove fluid from around your lungs.  Follow these instructions at home:  Medicines   Take over-the-counter and prescription medicines only as told by your doctor.  ? Only take cough medicine if you are losing sleep.   If you were prescribed an antibiotic medicine, take it as told by your doctor. Do not stop taking the antibiotic even if you start to feel better.  General instructions     Sleep with your head and neck raised (elevated). You can do this by sleeping in a recliner or by putting a few pillows under your head.   Rest as needed. Get at least 8 hours of sleep each night.   Drink enough water to keep your pee (urine) pale yellow.   Eat a healthy diet that includes plenty of vegetables, fruits, whole grains, low-fat dairy products, and lean protein.   Do not use any products that contain nicotine or tobacco. These include cigarettes, e-cigarettes, and chewing tobacco. If you need help quitting, ask your doctor.   Keep all follow-up visits as told by your doctor. This is important.  How is this prevented?  A shot (vaccine) can help prevent pneumonia. Shots are often suggested for:   People   older than 39 years of age.   People older than 39 years of age who:  ? Are having cancer treatment.  ? Have long-term (chronic) lung disease.  ? Have problems with their body's defense system.  You may also prevent pneumonia if you take these actions:   Get the flu (influenza) shot every year.   Go to the dentist as often as told.   Wash your hands often. If you cannot use soap and water, use hand sanitizer.  Contact a doctor if:   You have a fever.   You lose sleep because your cough medicine does not help.  Get help right away if:   You are short of breath and it gets worse.   You have more chest pain.   Your sickness gets worse. This is very serious if:  ? You are an older adult.  ? Your body's defense system is weak.   You  cough up blood.  Summary   Pneumonia is an infection of the lungs.   Most adults can be treated at home. Some will need treatment in a hospital.   Drink enough water to keep your pee pale yellow.   Get at least 8 hours of sleep each night.  This information is not intended to replace advice given to you by your health care provider. Make sure you discuss any questions you have with your health care provider.  Document Released: 01/14/2008 Document Revised: 03/25/2018 Document Reviewed: 03/25/2018  Elsevier Interactive Patient Education  2019 [employer]vier Inc.

## 2018-08-02 NOTE — Assessment & Plan Note (Addendum)
-  History and exam consistent with RML/RLL PNA. -CXR requested -Will cover for CAP with augmentin and azithromycin with history of T2DM -Rx for albuterol but would prefer that she restart metoprolol before using this -Zofran ODT for nausea. -F/u in 3-4 days.

## 2018-08-06 ENCOUNTER — Encounter: Payer: Self-pay | Admitting: Family

## 2018-08-06 ENCOUNTER — Ambulatory Visit: Payer: 59 | Admitting: Family

## 2018-08-06 VITALS — BP 128/72 | HR 113 | Temp 97.9°F | Ht 64.0 in | Wt 221.0 lb

## 2018-08-06 DIAGNOSIS — J189 Pneumonia, unspecified organism: Secondary | ICD-10-CM | POA: Diagnosis not present

## 2018-08-06 MED ORDER — FLUCONAZOLE 150 MG PO TABS: 150.0000 mg | ORAL_TABLET | Freq: Once | ORAL | 0 refills | Status: AC

## 2018-08-06 NOTE — Progress Notes (Signed)
Lisa Duran is a 37 y.o. female with the following history as recorded in EpicCare:  Patient Active Problem List   Diagnosis Date Noted  . Cough 08/02/2018  . Strain of Achilles tendon, right, initial encounter 01/01/2018  . Sleep-related headache 04/06/2017  . Sleep walking disorder 04/06/2017  . OSA (obstructive sleep apnea) 04/06/2017  . Iron deficiency anemia 03/27/2017  . Arthralgia 02/03/2017  . Fibromyalgia 02/03/2017  . GERD (gastroesophageal reflux disease) 06/24/2015  . Eczema 06/22/2015  . Depression 06/22/2015  . Tachycardia 06/16/2014  . Paresthesias 06/12/2014  . Food allergy   . Wrist pain, right 09/14/2013  . Elbow pain, right 09/05/2013  . ADHD (attention deficit hyperactivity disorder) 01/27/2011  . Anxiety 02/20/2010  . ALLERGIC RHINITIS 11/21/2009  . OVARIAN CYST 11/21/2009  . Diabetes mellitus type 2 with neurological manifestations ([place]) 11/20/2009  . Class 2 obesity in adult 11/20/2009  . Migraine without aura 11/20/2009    Current Outpatient Medications  Medication Sig Dispense Refill  . albuterol (PROVENTIL HFA;VENTOLIN HFA) 108 (90 Base) MCG/ACT inhaler Inhale 2 puffs into the lungs every 6 (six) hours as needed for wheezing or shortness of breath. 1 Inhaler 0  . almotriptan (AXERT) 12.5 MG tablet Take 1 tablet (12.5 mg total) by mouth as needed for migraine. may repeat in 2 hours if needed 12 tablet 3  . ALPRAZolam (XANAX) 0.5 MG tablet TAKE 1 TABLET(0.5 MG) BY MOUTH THREE TIMES DAILY AS NEEDED FOR ANXIETY 30 tablet 0  . amoxicillin-clavulanate (AUGMENTIN XR) 1000-62.5 MG 12 hr tablet Take 2 tablets by mouth 2 (two) times daily for 10 days. 40 tablet 0  . amphetamine-dextroamphetamine (ADDERALL XR) 20 MG 24 hr capsule Take 1 capsule (20 mg total) by mouth daily. 30 capsule 0  . azithromycin (ZITHROMAX) 250 MG tablet Take 2 tabs on day 1 followed by 1 tab on days 2-5 6 tablet 0  . Cyanocobalamin (B-12) 1000 MCG SUBL Place 1,000 mcg under the tongue  daily. 30 each 3  . DULoxetine (CYMBALTA) 60 MG capsule Take 1 capsule (60 mg total) by mouth daily. 90 capsule 3  . fluticasone (FLONASE) 50 MCG/ACT nasal spray Place 2 sprays into both nostrils daily. 16 g 12  . gabapentin (NEURONTIN) 100 MG capsule Take 2 capsules (200 mg total) by mouth at bedtime. 180 capsule 1  . glipiZIDE (GLUCOTROL XL) 5 MG 24 hr tablet Take 1 tablet (5 mg total) by mouth daily with breakfast. 90 tablet 3  . glucose blood (ONETOUCH VERIO) test strip Use 2x a day 200 each 3  . Insulin Glargine (LANTUS SOLOSTAR) 100 UNIT/ML Solostar Pen Inject 32 units at bedtime. 15 pen 1  . Insulin Pen Needle (BD PEN NEEDLE NANO U/F) 32G X 4 MM MISC USE ONCE A DAY 100 each 3  . iron polysaccharides (NIFEREX) 150 MG capsule Take 1 capsule (150 mg total) by mouth daily. 30 capsule 5  . lamoTRIgine (LAMICTAL) 100 MG tablet TAKE 1 TABLET BY MOUTH TWO  TIMES DAILY 180 tablet 1  . levonorgestrel (MIRENA) 20 MCG/24HR IUD 1 Intra Uterine Device (1 each total) by Intrauterine route once. 1 each 0  . metFORMIN (GLUCOPHAGE) 1000 MG tablet TAKE 1 TABLET BY  MOUTH TWICE A DAY WITH A  MEAL 180 tablet 3  . metoprolol succinate (TOPROL-XL) 25 MG 24 hr tablet TAKE ONE-HALF TABLET BY  MOUTH DAILY 45 tablet 1  . mupirocin cream (BACTROBAN) 2 % Apply 1 application topically 2 (two) times daily. 15 g 0  .  omeprazole (PRILOSEC) 40 MG capsule TAKE 1 CAPSULE BY MOUTH  DAILY BEFORE SUPPER 90 capsule 3  . ondansetron (ZOFRAN ODT) 4 MG disintegrating tablet Take 1 tablet (4 mg total) by mouth every 8 (eight) hours as needed for nausea or vomiting. 20 tablet 0  . Prenatal Vit-Fe Fumarate-FA (MULTIVITAMIN-PRENATAL) 27-0.8 MG TABS tablet Take 1 tablet by mouth daily at 12 noon.    . sitaGLIPtin (JANUVIA) 100 MG tablet Take 1 tablet (100 mg total) by mouth daily. 90 tablet 3  . traMADol-acetaminophen (ULTRACET) 37.5-325 MG per tablet Take 1 tablet by mouth every 6 (six) hours as needed for pain.    [name] triamcinolone cream  (KENALOG) 0.1 % Apply topically 2 (two) times daily as needed. 30 g 1  . fluconazole (DIFLUCAN) 150 MG tablet Take 1 tablet (150 mg total) by mouth once for 1 dose. Repeat after 72 hours 2 tablet 0   No current facility-administered medications for this visit.     Allergies: Propofol; Chicken allergy; Gluten meal; Other; and Victoza [liraglutide]  Past Medical History:  Diagnosis Date  . ADHD (attention deficit hyperactivity disorder)   . ALLERGIC RHINITIS   . Allergy   . Anemia   . ANXIETY   . Depression   . DIABETES MELLITUS, TYPE II   . Fibromyalgia 2018  . GERD (gastroesophageal reflux disease)   . MIGRAINE, COMMON   . Obesity, unspecified   . OVARIAN CYST     Past Surgical History:  Procedure Laterality Date  . NO PAST SURGERIES      Family History  Problem Relation Age of Onset  . Diabetes Mother   . Hyperlipidemia Mother   . Diabetes Father   . Hyperlipidemia Father   . Coronary artery disease Father   . Hypertension Father   . Cancer Father        esoph  . Stroke Father   . Cancer Maternal Grandfather 41       Prostate and Lung  . Diabetes Sister        Gestational  . Berenice Primas' disease Maternal Aunt   . Cancer Maternal Uncle        Brain  . Stroke Paternal Grandmother   . Heart disease Paternal Grandmother   . Stroke Paternal Grandfather   . Heart disease Paternal Grandfather   . Diabetes Sister        Gestational and Type II  . Hashimoto's thyroiditis Cousin   . Cancer Maternal Uncle        Brain    Social History   Tobacco Use  . Smoking status: Former Smoker    Last attempt to quit: 08/20/2002    Years since quitting: 15.9  . Smokeless tobacco: Never Used  . Tobacco comment: single, separated from spouse 02/2010.   Substance Use Topics  . Alcohol use: Yes    Comment: rarely    Subjective:  Diagnosed with pneumonia on Monday, December 23; was started on Zithromax and Augmentin; will finish Zithromax today/ has 5 more days of Augmentin; notes she  is feeling much better- admits still not 100%; no further vomiting; no further fever; blood sugars are normalizing- down to 119; concerned about developing yeast infection;      Objective:  Vitals:   08/06/18 0901  BP: 128/72  Pulse: (!) 113  Temp: 97.9 F (36.6 C)  TempSrc: Oral  SpO2: 96%  Weight: 221 lb (100.2 kg)  Height: 5\' 4"  (1.626 m)    General: Well developed, well nourished, in  no acute distress  Skin : Warm and dry.  Head: Normocephalic and atraumatic  Eyes: Sclera and conjunctiva clear; pupils round and reactive to light; extraocular movements intact  Ears: External normal; canals clear; tympanic membranes normal  Oropharynx: Pink, supple. No suspicious lesions  Neck: Supple without thyromegaly, adenopathy  Lungs: Respirations unlabored; coarse breath sounds but no rhonchi noted;  CVS exam: normal rate and regular rhythm.  Neurologic: Alert and oriented; speech intact; face symmetrical; moves all extremities well; CNII-XII intact without focal deficit  Assessment:  1. Pneumonia due to infectious organism, unspecified laterality, unspecified part of lung     Plan:  Physical exam is reassuring; patient notes she is feeling much better; continue Augmentin as prescribed; continue to use albuterol as needed; will plan to have patient update X-ray in 4-6 weeks to ensure clearance;  Follow-up as needed if symptoms do not continue to improve; reassurance that it may take her 4-6 weeks to feel back to her baseline.   No follow-ups on file.  Orders Placed This Encounter  Procedures  . DG Chest 2 View    Standing Status:   Future    Standing Expiration Date:   10/08/2019    Order Specific Question:   Reason for Exam (SYMPTOM  OR DIAGNOSIS REQUIRED)    Answer:   cough/ follow-up pneumonia    Order Specific Question:   Is patient pregnant?    Answer:   No    Order Specific Question:   Preferred imaging location?    Answer:   [facility]    Order Specific Question:    Radiology Contrast Protocol - do NOT remove file path    Answer:   [email or web address]    Requested Prescriptions   Signed Prescriptions Disp Refills  . fluconazole (DIFLUCAN) 150 MG tablet 2 tablet 0    Sig: Take 1 tablet (150 mg total) by mouth once for 1 dose. Repeat after 72 hours

## 2018-08-18 ENCOUNTER — Encounter: Payer: Self-pay | Admitting: Family

## 2018-08-19 ENCOUNTER — Other Ambulatory Visit: Payer: Self-pay | Admitting: Family

## 2018-08-19 MED ORDER — FLUCONAZOLE 150 MG PO TABS: 150.0000 mg | ORAL_TABLET | Freq: Once | ORAL | 0 refills | Status: AC

## 2018-09-21 ENCOUNTER — Other Ambulatory Visit: Payer: Self-pay | Admitting: *Deleted

## 2018-09-21 NOTE — Patient Outreach (Signed)
[facility]) Care Management  09/21/2018  Lisa Duran [birth year] [id number]   Telephone Screening  Date referral received: 09/15/18 Initial outreach: 09/21/18 Insurance: [employer]  Initial unsuccessful telephone call to patient's preferred number in order to complete high risk screening; no answer, left HIPAA compliant voicemail message requesting return call.  Comorbidities include: Migraine, Obstructive Sleep Apnea, GERD, Type 2 DM with most recent Hgb A1C= 7.2% on 05/27/18,  ADHD, Anxiety, Depression, Obesity, Fibromyalgia, Iron Deficiency Anemia and kidney stones Per the electronic medical record her last visit with her primary care provider was 08/06/18 with a diagnosis of pneumonia that did not require hospitalization. She is scheduled for her annual wellness exam on 10/20/18. She saw her endocrinologist , Dr Philemon Kingdom on 05/27/18 and per the MD notes, [name]'s diabetes management had significantly improved. She has a scheduled follow up on 12/24/18. Her most recent emergency department visit was 06/19/18, when she presented to [facility] with symptoms of a kidney stone. There are no recent inpatient hospitalizations reflected in the electronic medical record.    Plan: This RNCM will attempt another outreach within 4 business days and will route unsuccessful outreach letter to [place] [facility] clinical pool to be mailed to patient's home address.   Barrington Ellison RN,CCM,CDE [place] Management Coordinator Office Phone [phone] Office Fax [phone]

## 2018-09-22 ENCOUNTER — Ambulatory Visit: Payer: Self-pay | Admitting: Internal Medicine

## 2018-09-24 ENCOUNTER — Other Ambulatory Visit: Payer: Self-pay | Admitting: *Deleted

## 2018-09-24 NOTE — Patient Outreach (Signed)
[facility]) Care Management  09/24/2018  Lisa Duran 12/13/1980 [id number]   Telephone Screening  Date referral received: 09/15/18 Initial outreach: 09/21/18 Insurance: [employer]alling  Second unsuccessful telephone call to patient's preferred number in order to complete [employer] referral high risk screening; no answer, left HIPAA compliant voicemail message requesting return call.  Comorbidities include: Migraine, Obstructive Sleep Apnea, GERD, Type 2 DM with most recent Hgb A1C= 7.2% on 05/27/18,  ADHD, Anxiety, Depression, Obesity, Fibromyalgia, Iron Deficiency Anemia and kidney stones Per the electronic medical record [name] last visit with her primary care provider was 08/06/18 with a diagnosis of pneumonia that did not require hospitalization. She is scheduled for her annual wellness exam on 10/20/18. She saw her endocrinologist , Dr Philemon Kingdom on 05/27/18 and per the MD notes, [name]'s diabetes management had significantly improved. She has a scheduled follow up on 12/24/18. Her most recent emergency department visit was 06/19/18, when she presented to [facility] with symptoms of a kidney stone. There are no recent inpatient hospitalizations reflected in the electronic medical record.    Plan: This RNCM will attempt another outreach within 4 business days in order to complete high risk screening.   Barrington Ellison RN,CCM,CDE [place] Management Coordinator Office Phone [phone] Office Fax [phone]

## 2018-09-29 ENCOUNTER — Ambulatory Visit (INDEPENDENT_AMBULATORY_CARE_PROVIDER_SITE_OTHER)
Admission: RE | Admit: 2018-09-29 | Discharge: 2018-09-29 | Disposition: A | Discharge status: Home / Self Care | Payer: 59 | Source: Ambulatory Visit | Attending: Family | Admitting: Family

## 2018-09-29 ENCOUNTER — Ambulatory Visit: Payer: Self-pay | Admitting: *Deleted

## 2018-09-29 ENCOUNTER — Other Ambulatory Visit: Payer: Self-pay | Admitting: *Deleted

## 2018-09-29 DIAGNOSIS — J189 Pneumonia, unspecified organism: Secondary | ICD-10-CM | POA: Diagnosis not present

## 2018-09-29 NOTE — Patient Outreach (Addendum)
[facility]) Care Management  09/29/2018  Lisa Duran 01/23/1981 [id number]   Telephone Screening  Date referral received:09/15/18 Initial outreach:09/21/18 Insurance: [employer] Falling  Subjective:  Third unsuccessful telephone call to patient's preferred number in order to complete[employer] referral high risk screening; no answer, left HIPAA compliant voicemail message requesting return call.   Objective:  Per the electronic medical record Lisa Duran last visit with her primary care provider, Lisa Duran, was12/27/19 with a diagnosis of pneumonia that did not require hospitalization. She is scheduled for her complete physical exam on 10/20/18. She saw her endocrinologist , Lisa Duran on 10/17/19and per the MD notes, Lisa Duran diabetes management had significantly improved. She has a scheduled follow up on 12/24/18. Her most recent emergency department visit was 06/19/18, when she presented to [facility] with symptoms of a kidney stone. There are no recent inpatient hospitalizations reflected in the electronic medical record. Comorbidities include:Migraine, Obstructive Sleep Apnea, GERD, Type 2 DM with most recent Hgb A1C= 7.2% on 05/27/18, ADHD, Anxiety, Depression, Obesity, Fibromyalgia, Iron Deficiency Anemia and kidney stones.  Plan: If no return call from patient, will close case to [facility] [employer] services in 10 business days after initial outreach.   Barrington Ellison RN,CCM,CDE [place] Management Coordinator Office Phone [phone] Office Fax [phone]

## 2018-10-04 ENCOUNTER — Other Ambulatory Visit: Payer: Self-pay | Admitting: *Deleted

## 2018-10-04 NOTE — Patient Outreach (Signed)
[facility] [place]) Care Management  10/04/2018  Neda Willenbring 1981/08/04 [id number]   Case Closure- Unsuccessful Outreach Insurance: [employer] Referral received: 09/15/18 Initial outreach: 09/21/18  Unable to complete [employer] referral high risk screening;  no return call from patient after 3 attempts and no response to request to contact this RNCM in unsuccessful outreach letter.   Plan: Case closed to [place] [facility] services as it has been 10 business days since initial outreach attempt.  Barrington Ellison RN,CCM,CDE [place] Management Coordinator Office Phone [phone] Office Fax [phone]

## 2018-10-05 ENCOUNTER — Encounter: Payer: Self-pay | Admitting: Internal Medicine

## 2018-10-05 MED ORDER — ALPRAZOLAM 0.5 MG PO TABS: ORAL_TABLET | ORAL | 0 refills | Status: DC

## 2018-10-05 MED ORDER — AMPHETAMINE-DEXTROAMPHET ER 20 MG PO CP24: 20.0000 mg | ORAL_CAPSULE | Freq: Every day | ORAL | 0 refills | Status: DC

## 2018-10-05 NOTE — Telephone Encounter (Signed)
Check [place] registry last filled Adderrall 07/13/2018 & Alprazolam 07/12/2018.[name] KitchenJohny Duran

## 2018-10-08 ENCOUNTER — Encounter: Payer: Self-pay | Admitting: Internal Medicine

## 2018-10-17 NOTE — Progress Notes (Signed)
Subjective:    Patient ID: Lisa Duran, female    DOB: July 21, 1981, 38 y.o.   MRN: [id number]  HPI She is here for a physical exam.   She denies changes in her health since she was here.  She has no concerns.  She feels good overall.     Medications and allergies reviewed with patient and updated if appropriate.  Patient Active Problem List   Diagnosis Date Noted  . Cough 08/02/2018  . Strain of Achilles tendon, right, initial encounter 01/01/2018  . Sleep-related headache 04/06/2017  . Sleep walking disorder 04/06/2017  . Iron deficiency anemia 03/27/2017  . Arthralgia 02/03/2017  . Fibromyalgia 02/03/2017  . GERD (gastroesophageal reflux disease) 06/24/2015  . Eczema 06/22/2015  . Depression 06/22/2015  . Tachycardia 06/16/2014  . Paresthesias 06/12/2014  . Food allergy   . Wrist pain, right 09/14/2013  . Elbow pain, right 09/05/2013  . ADHD (attention deficit hyperactivity disorder) 01/27/2011  . Anxiety 02/20/2010  . ALLERGIC RHINITIS 11/21/2009  . OVARIAN CYST 11/21/2009  . Diabetes mellitus type 2 with neurological manifestations ([place]) 11/20/2009  . Class 2 obesity in adult 11/20/2009  . Migraine without aura 11/20/2009    Current Outpatient Medications on File Prior to Visit  Medication Sig Dispense Refill  . ALPRAZolam (XANAX) 0.5 MG tablet TAKE 1 TABLET(0.5 MG) BY MOUTH THREE TIMES DAILY AS NEEDED FOR ANXIETY 30 tablet 0  . amphetamine-dextroamphetamine (ADDERALL XR) 20 MG 24 hr capsule Take 1 capsule (20 mg total) by mouth daily. 30 capsule 0  . glipiZIDE (GLUCOTROL XL) 5 MG 24 hr tablet Take 1 tablet (5 mg total) by mouth daily with breakfast. 90 tablet 3  . glucose blood (ONETOUCH VERIO) test strip Use 2x a day 200 each 3  . Insulin Glargine (LANTUS SOLOSTAR) 100 UNIT/ML Solostar Pen Inject 32 units at bedtime. 15 pen 1  . Insulin Pen Needle (BD PEN NEEDLE NANO U/F) 32G X 4 MM MISC USE ONCE A DAY 100 each 3  . iron polysaccharides (NIFEREX) 150 MG capsule  Take 1 capsule (150 mg total) by mouth daily. 30 capsule 5  . lamoTRIgine (LAMICTAL) 100 MG tablet TAKE 1 TABLET BY MOUTH TWO  TIMES DAILY 180 tablet 1  . levonorgestrel (MIRENA) 20 MCG/24HR IUD 1 Intra Uterine Device (1 each total) by Intrauterine route once. 1 each 0  . metFORMIN (GLUCOPHAGE) 1000 MG tablet TAKE 1 TABLET BY  MOUTH TWICE A DAY WITH A  MEAL 180 tablet 3  . Prenatal Vit-Fe Fumarate-FA (MULTIVITAMIN-PRENATAL) 27-0.8 MG TABS tablet Take 1 tablet by mouth daily at 12 noon.    . sitaGLIPtin (JANUVIA) 100 MG tablet Take 1 tablet (100 mg total) by mouth daily. 90 tablet 3  . triamcinolone cream (KENALOG) 0.1 % Apply topically 2 (two) times daily as needed. 30 g 1  . vitamin B-12 (CYANOCOBALAMIN) 1000 MCG tablet Take 1,000 mcg by mouth daily.     No current facility-administered medications on file prior to visit.     Past Medical History:  Diagnosis Date  . ADHD (attention deficit hyperactivity disorder)   . ALLERGIC RHINITIS   . Allergy   . Anemia   . ANXIETY   . Depression   . DIABETES MELLITUS, TYPE II   . Fibromyalgia 2018  . GERD (gastroesophageal reflux disease)   . MIGRAINE, COMMON   . Obesity, unspecified   . OVARIAN CYST     Past Surgical History:  Procedure Laterality Date  . NO PAST SURGERIES  Social History   Socioeconomic History  . Marital status: Single    Spouse name: Not on file  . Number of children: Not on file  . Years of education: Not on file  . Highest education level: Not on file  Occupational History  . Not on file  Social Needs  . Financial resource strain: Not on file  . Food insecurity:    Worry: Not on file    Inability: Not on file  . Transportation needs:    Medical: Not on file    Non-medical: Not on file  Tobacco Use  . Smoking status: Former Smoker    Last attempt to quit: 08/20/2002    Years since quitting: 16.1  . Smokeless tobacco: Never Used  . Tobacco comment: single, separated from spouse 02/2010.   Substance  and Sexual Activity  . Alcohol use: Yes    Comment: rarely  . Drug use: No  . Sexual activity: Not Currently    Birth control/protection: I.U.D.  Lifestyle  . Physical activity:    Days per week: Not on file    Minutes per session: Not on file  . Stress: Not on file  Relationships  . Social connections:    Talks on phone: Not on file    Gets together: Not on file    Attends religious service: Not on file    Active member of club or organization: Not on file    Attends meetings of clubs or organizations: Not on file    Relationship status: Not on file  Other Topics Concern  . Not on file  Social History Narrative   Single, separated from spouse 02/2010    Family History  Problem Relation Age of Onset  . Diabetes Mother   . Hyperlipidemia Mother   . Diabetes Father   . Hyperlipidemia Father   . Coronary artery disease Father   . Hypertension Father   . Cancer Father        esoph  . Stroke Father   . Cancer Maternal Grandfather 55       Prostate and Lung  . Diabetes Sister        Gestational  . Berenice Primas' disease Maternal Aunt   . Cancer Maternal Uncle        Brain  . Stroke Paternal Grandmother   . Heart disease Paternal Grandmother   . Stroke Paternal Grandfather   . Heart disease Paternal Grandfather   . Diabetes Sister        Gestational and Type II  . Hashimoto's thyroiditis Cousin   . Cancer Maternal Uncle        Brain    Review of Systems  Constitutional: Negative for chills and fever.  Eyes: Negative for visual disturbance.  Respiratory: Negative for cough, shortness of breath and wheezing.   Cardiovascular: Positive for palpitations (2-3 times a week, lasts for hours). Negative for chest pain and leg swelling.  Gastrointestinal: Negative for abdominal pain, blood in stool, constipation, diarrhea and nausea.       GERD controlled  Endocrine: Positive for heat intolerance (feels hot a lot).  Genitourinary: Negative for dysuria and hematuria.    Musculoskeletal: Positive for arthralgias (knees, hips) and myalgias.  Skin: Positive for color change (left forearm). Negative for rash.  Neurological: Positive for light-headedness and headaches (occasional  - tension, sinus, rare migraines). Negative for dizziness and numbness.  Psychiatric/Behavioral: Positive for dysphoric mood. The patient is nervous/anxious.        Objective:  Vitals:   10/20/18 0837  BP: 122/84  Pulse: 92  Resp: 16  Temp: 98.6 F (37 C)  SpO2: 97%   Filed Weights   10/20/18 0837  Weight: 224 lb (101.6 kg)   Body mass index is 38.45 kg/m.  BP Readings from Last 3 Encounters:  10/20/18 122/84  08/06/18 128/72  08/02/18 (!) 158/94    Wt Readings from Last 3 Encounters:  10/20/18 224 lb (101.6 kg)  08/06/18 221 lb (100.2 kg)  08/02/18 220 lb (99.8 kg)     Physical Exam Constitutional: She appears well-developed and well-nourished. No distress.  HENT:  Head: Normocephalic and atraumatic.  Right Ear: External ear normal. Normal ear canal and TM Left Ear: External ear normal.  Normal ear canal and TM Mouth/Throat: Oropharynx is clear and moist.  Eyes: Conjunctivae and EOM are normal.  Neck: Neck supple. No tracheal deviation present. No thyromegaly present.  No carotid bruit  Cardiovascular: Normal rate, regular rhythm and normal heart sounds.   No murmur heard.  No edema. Pulmonary/Chest: Effort normal and breath sounds normal. No respiratory distress. She has no wheezes. She has no rales.  Breast: deferred to Gyn Abdominal: Soft. She exhibits no distension. There is no tenderness.  Lymphadenopathy: She has no cervical adenopathy.  Skin: Skin is warm and dry. She is not diaphoretic.  Psychiatric: She has a normal mood and affect. Her behavior is normal.   Diabetic Foot Exam - Simple   Simple Foot Form Diabetic Foot exam was performed with the following findings:  Yes 10/20/2018  9:10 AM  Visual Inspection No deformities, no ulcerations,  no other skin breakdown bilaterally:  Yes Sensation Testing Intact to touch and monofilament testing bilaterally:  Yes Pulse Check Posterior Tibialis and Dorsalis pulse intact bilaterally:  Yes Comments         Assessment & Plan:   Physical exam: Screening blood work  ordered Immunizations   Up to date Mammogram will discuss with gyn - can get in her office Gyn  Has appt scheduled - Dr cousins Eye Exam:  Up to date Exercise   Walking a lot -- stretching Weight  Encouraged weight loss Skin  One spot on forearm  Substance abuse  none  See Problem List for Assessment and Plan of chronic medical problems.

## 2018-10-17 NOTE — Patient Instructions (Addendum)
Tests ordered today. Your results will be released to MyChart (or called to you) after review, usually within 72hours after test completion. If any changes need to be made, you will be notified at that same time.  All other Health Maintenance issues reviewed.   All recommended immunizations and age-appropriate screenings are up-to-date or discussed.  No immunizations administered today.   Medications reviewed and updated.  Changes include :   none  Your prescription(s) have been submitted to your pharmacy. Please take as directed and contact our office if you believe you are having problem(s) with the medication(s).   Please followup in 6 months   Health Maintenance, Female Adopting a healthy lifestyle and getting preventive care can go a long way to promote health and wellness. Talk with your health care provider about what schedule of regular examinations is right for you. This is a good chance for you to check in with your provider about disease prevention and staying healthy. In between checkups, there are plenty of things you can do on your own. Experts have done a lot of research about which lifestyle changes and preventive measures are most likely to keep you healthy. Ask your health care provider for more information. Weight and diet Eat a healthy diet  Be sure to include plenty of vegetables, fruits, low-fat dairy products, and lean protein.  Do not eat a lot of foods high in solid fats, added sugars, or salt.  Get regular exercise. This is one of the most important things you can do for your health. ? Most adults should exercise for at least 150 minutes each week. The exercise should increase your heart rate and make you sweat (moderate-intensity exercise). ? Most adults should also do strengthening exercises at least twice a week. This is in addition to the moderate-intensity exercise. Maintain a healthy weight  Body mass index (BMI) is a measurement that can be used to  identify possible weight problems. It estimates body fat based on height and weight. Your health care provider can help determine your BMI and help you achieve or maintain a healthy weight.  For females 20 years of age and older: ? A BMI below 18.5 is considered underweight. ? A BMI of 18.5 to 24.9 is normal. ? A BMI of 25 to 29.9 is considered overweight. ? A BMI of 30 and above is considered obese. Watch levels of cholesterol and blood lipids  You should start having your blood tested for lipids and cholesterol at 40 years of age, then have this test every 5 years.  You may need to have your cholesterol levels checked more often if: ? Your lipid or cholesterol levels are high. ? You are older than 40 years of age. ? You are at high risk for heart disease. Cancer screening Lung Cancer  Lung cancer screening is recommended for adults 55-80 years old who are at high risk for lung cancer because of a history of smoking.  A yearly low-dose CT scan of the lungs is recommended for people who: ? Currently smoke. ? Have quit within the past 15 years. ? Have at least a 30-pack-year history of smoking. A pack year is smoking an average of one pack of cigarettes a day for 1 year.  Yearly screening should continue until it has been 15 years since you quit.  Yearly screening should stop if you develop a health problem that would prevent you from having lung cancer treatment. Breast Cancer  Practice breast self-awareness. This means understanding how   your breasts normally appear and feel.  It also means doing regular breast self-exams. Let your health care provider know about any changes, no matter how small.  If you are in your 20s or 30s, you should have a clinical breast exam (CBE) by a health care provider every 1-3 years as part of a regular health exam.  If you are 40 or older, have a CBE every year. Also consider having a breast X-ray (mammogram) every year.  If you have a family  history of breast cancer, talk to your health care provider about genetic screening.  If you are at high risk for breast cancer, talk to your health care provider about having an MRI and a mammogram every year.  Breast cancer gene (BRCA) assessment is recommended for women who have family members with BRCA-related cancers. BRCA-related cancers include: ? Breast. ? Ovarian. ? Tubal. ? Peritoneal cancers.  Results of the assessment will determine the need for genetic counseling and BRCA1 and BRCA2 testing. Cervical Cancer Your health care provider may recommend that you be screened regularly for cancer of the pelvic organs (ovaries, uterus, and vagina). This screening involves a pelvic examination, including checking for microscopic changes to the surface of your cervix (Pap test). You may be encouraged to have this screening done every 3 years, beginning at age 21.  For women ages 30-65, health care providers may recommend pelvic exams and Pap testing every 3 years, or they may recommend the Pap and pelvic exam, combined with testing for human papilloma virus (HPV), every 5 years. Some types of HPV increase your risk of cervical cancer. Testing for HPV may also be done on women of any age with unclear Pap test results.  Other health care providers may not recommend any screening for nonpregnant women who are considered low risk for pelvic cancer and who do not have symptoms. Ask your health care provider if a screening pelvic exam is right for you.  If you have had past treatment for cervical cancer or a condition that could lead to cancer, you need Pap tests and screening for cancer for at least 20 years after your treatment. If Pap tests have been discontinued, your risk factors (such as having a new sexual partner) need to be reassessed to determine if screening should resume. Some women have medical problems that increase the chance of getting cervical cancer. In these cases, your health care  provider may recommend more frequent screening and Pap tests. Colorectal Cancer  This type of cancer can be detected and often prevented.  Routine colorectal cancer screening usually begins at 40 years of age and continues through 40 years of age.  Your health care provider may recommend screening at an earlier age if you have risk factors for colon cancer.  Your health care provider may also recommend using home test kits to check for hidden blood in the stool.  A small camera at the end of a tube can be used to examine your colon directly (sigmoidoscopy or colonoscopy). This is done to check for the earliest forms of colorectal cancer.  Routine screening usually begins at age 50.  Direct examination of the colon should be repeated every 5-10 years through 40 years of age. However, you may need to be screened more often if early forms of precancerous polyps or small growths are found. Skin Cancer  Check your skin from head to toe regularly.  Tell your health care provider about any new moles or changes in moles, especially   if there is a change in a mole's shape or color.  Also tell your health care provider if you have a mole that is larger than the size of a pencil eraser.  Always use sunscreen. Apply sunscreen liberally and repeatedly throughout the day.  Protect yourself by wearing long sleeves, pants, a wide-brimmed hat, and sunglasses whenever you are outside. Heart disease, diabetes, and high blood pressure  High blood pressure causes heart disease and increases the risk of stroke. High blood pressure is more likely to develop in: ? People who have blood pressure in the high end of the normal range (130-139/85-89 mm Hg). ? People who are overweight or obese. ? People who are African American.  If you are 18-39 years of age, have your blood pressure checked every 3-5 years. If you are 40 years of age or older, have your blood pressure checked every year. You should have your  blood pressure measured twice-once when you are at a hospital or clinic, and once when you are not at a hospital or clinic. Record the average of the two measurements. To check your blood pressure when you are not at a hospital or clinic, you can use: ? An automated blood pressure machine at a pharmacy. ? A home blood pressure monitor.  If you are between 55 years and 79 years old, ask your health care provider if you should take aspirin to prevent strokes.  Have regular diabetes screenings. This involves taking a blood sample to check your fasting blood sugar level. ? If you are at a normal weight and have a low risk for diabetes, have this test once every three years after 40 years of age. ? If you are overweight and have a high risk for diabetes, consider being tested at a younger age or more often. Preventing infection Hepatitis B  If you have a higher risk for hepatitis B, you should be screened for this virus. You are considered at high risk for hepatitis B if: ? You were born in a country where hepatitis B is common. Ask your health care provider which countries are considered high risk. ? Your parents were born in a high-risk country, and you have not been immunized against hepatitis B (hepatitis B vaccine). ? You have HIV or AIDS. ? You use needles to inject street drugs. ? You live with someone who has hepatitis B. ? You have had sex with someone who has hepatitis B. ? You get hemodialysis treatment. ? You take certain medicines for conditions, including cancer, organ transplantation, and autoimmune conditions. Hepatitis C  Blood testing is recommended for: ? Everyone born from 1945 through 1965. ? Anyone with known risk factors for hepatitis C. Sexually transmitted infections (STIs)  You should be screened for sexually transmitted infections (STIs) including gonorrhea and chlamydia if: ? You are sexually active and are younger than 40 years of age. ? You are older than 40  years of age and your health care provider tells you that you are at risk for this type of infection. ? Your sexual activity has changed since you were last screened and you are at an increased risk for chlamydia or gonorrhea. Ask your health care provider if you are at risk.  If you do not have HIV, but are at risk, it may be recommended that you take a prescription medicine daily to prevent HIV infection. This is called pre-exposure prophylaxis (PrEP). You are considered at risk if: ? You are sexually active and do not   regularly use condoms or know the HIV status of your partner(s). ? You take drugs by injection. ? You are sexually active with a partner who has HIV. Talk with your health care provider about whether you are at high risk of being infected with HIV. If you choose to begin PrEP, you should first be tested for HIV. You should then be tested every 3 months for as long as you are taking PrEP. Pregnancy  If you are premenopausal and you may become pregnant, ask your health care provider about preconception counseling.  If you may become pregnant, take 400 to 800 micrograms (mcg) of folic acid every day.  If you want to prevent pregnancy, talk to your health care provider about birth control (contraception). Osteoporosis and menopause  Osteoporosis is a disease in which the bones lose minerals and strength with aging. This can result in serious bone fractures. Your risk for osteoporosis can be identified using a bone density scan.  If you are 65 years of age or older, or if you are at risk for osteoporosis and fractures, ask your health care provider if you should be screened.  Ask your health care provider whether you should take a calcium or vitamin D supplement to lower your risk for osteoporosis.  Menopause may have certain physical symptoms and risks.  Hormone replacement therapy may reduce some of these symptoms and risks. Talk to your health care provider about whether  hormone replacement therapy is right for you. Follow these instructions at home:  Schedule regular health, dental, and eye exams.  Stay current with your immunizations.  Do not use any tobacco products including cigarettes, chewing tobacco, or electronic cigarettes.  If you are pregnant, do not drink alcohol.  If you are breastfeeding, limit how much and how often you drink alcohol.  Limit alcohol intake to no more than 1 drink per day for nonpregnant women. One drink equals 12 ounces of beer, 5 ounces of wine, or 1 ounces of hard liquor.  Do not use street drugs.  Do not share needles.  Ask your health care provider for help if you need support or information about quitting drugs.  Tell your health care provider if you often feel depressed.  Tell your health care provider if you have ever been abused or do not feel safe at home. This information is not intended to replace advice given to you by your health care provider. Make sure you discuss any questions you have with your health care provider. Document Released: 02/10/2011 Document Revised: 01/03/2016 Document Reviewed: 05/01/2015 Elsevier Interactive Patient Education  2019 [employer]vier Inc.  

## 2018-10-20 ENCOUNTER — Ambulatory Visit (INDEPENDENT_AMBULATORY_CARE_PROVIDER_SITE_OTHER): Payer: 59 | Admitting: Internal Medicine

## 2018-10-20 ENCOUNTER — Other Ambulatory Visit (INDEPENDENT_AMBULATORY_CARE_PROVIDER_SITE_OTHER): Payer: 59

## 2018-10-20 ENCOUNTER — Other Ambulatory Visit: Payer: Self-pay

## 2018-10-20 ENCOUNTER — Encounter: Payer: Self-pay | Admitting: Internal Medicine

## 2018-10-20 VITALS — BP 122/84 | HR 92 | Temp 98.6°F | Resp 16 | Ht 64.0 in | Wt 224.0 lb

## 2018-10-20 DIAGNOSIS — Z0000 Encounter for general adult medical examination without abnormal findings: Secondary | ICD-10-CM | POA: Diagnosis not present

## 2018-10-20 DIAGNOSIS — E1149 Type 2 diabetes mellitus with other diabetic neurological complication: Secondary | ICD-10-CM | POA: Diagnosis not present

## 2018-10-20 DIAGNOSIS — F32A Depression, unspecified: Secondary | ICD-10-CM

## 2018-10-20 DIAGNOSIS — G43009 Migraine without aura, not intractable, without status migrainosus: Secondary | ICD-10-CM

## 2018-10-20 DIAGNOSIS — R69 Illness, unspecified: Secondary | ICD-10-CM | POA: Diagnosis not present

## 2018-10-20 DIAGNOSIS — K219 Gastro-esophageal reflux disease without esophagitis: Secondary | ICD-10-CM

## 2018-10-20 DIAGNOSIS — M797 Fibromyalgia: Secondary | ICD-10-CM

## 2018-10-20 DIAGNOSIS — Z6838 Body mass index (BMI) 38.0-38.9, adult: Secondary | ICD-10-CM

## 2018-10-20 DIAGNOSIS — F909 Attention-deficit hyperactivity disorder, unspecified type: Secondary | ICD-10-CM

## 2018-10-20 DIAGNOSIS — F329 Major depressive disorder, single episode, unspecified: Secondary | ICD-10-CM

## 2018-10-20 LAB — COMPREHENSIVE METABOLIC PANEL
ALK PHOS: 133 U/L — AB (ref 39–117)
ALT: 45 U/L — AB (ref 0–35)
AST: 30 U/L (ref 0–37)
Albumin: 4.1 g/dL (ref 3.5–5.2)
BILIRUBIN TOTAL: 0.2 mg/dL (ref 0.2–1.2)
BUN: 11 mg/dL (ref 6–23)
CO2: 23 mEq/L (ref 19–32)
Calcium: 9.6 mg/dL (ref 8.4–10.5)
Chloride: 103 mEq/L (ref 96–112)
Creatinine, Ser: 0.72 mg/dL (ref 0.40–1.20)
GFR: 89.7 mL/min (ref >60.00)
Glucose, Bld: 169 mg/dL — ABNORMAL HIGH (ref 70–99)
Potassium: 4.1 mEq/L (ref 3.5–5.1)
SODIUM: 137 meq/L (ref 135–145)
Total Protein: 7.5 g/dL (ref 6.0–8.3)

## 2018-10-20 LAB — CBC WITH DIFFERENTIAL/PLATELET
BASOS ABS: 0.1 10*3/uL (ref 0.0–0.1)
Basophils Relative: 0.9 % (ref 0.0–3.0)
EOS ABS: 0.7 10*3/uL (ref 0.0–0.7)
Eosinophils Relative: 7.2 % — ABNORMAL HIGH (ref 0.0–5.0)
HCT: 40.3 % (ref 36.0–46.0)
Hemoglobin: 12.6 g/dL (ref 12.0–15.0)
Lymphocytes Relative: 20 % (ref 12.0–46.0)
Lymphs Abs: 2 10*3/uL (ref 0.7–4.0)
MCHC: 31.3 g/dL (ref 30.0–36.0)
MCV: 78.7 fl (ref 78.0–100.0)
MONO ABS: 0.7 10*3/uL (ref 0.1–1.0)
Monocytes Relative: 6.6 % (ref 3.0–12.0)
Neutro Abs: 6.6 10*3/uL (ref 1.4–7.7)
Neutrophils Relative %: 65.3 % (ref 43.0–77.0)
Platelets: 391 10*3/uL (ref 150.0–400.0)
RBC: 5.12 Mil/uL — AB (ref 3.87–5.11)
RDW: 17.7 % — ABNORMAL HIGH (ref 11.5–15.5)
WBC: 10.1 10*3/uL (ref 4.0–10.5)

## 2018-10-20 LAB — MICROALBUMIN / CREATININE URINE RATIO
Creatinine,U: 86.9 mg/dL
Microalb Creat Ratio: 0.8 mg/g (ref 0.0–30.0)
Microalb, Ur: <0.7 mg/dL (ref 0.0–1.9)

## 2018-10-20 LAB — LIPID PANEL
Cholesterol: 193 mg/dL (ref 0–200)
HDL: 65.4 mg/dL (ref >39.00)
LDL Cholesterol: 108 mg/dL — ABNORMAL HIGH (ref 0–99)
NonHDL: 127.4
Total CHOL/HDL Ratio: 3
Triglycerides: 96 mg/dL (ref 0.0–149.0)
VLDL: 19.2 mg/dL (ref 0.0–40.0)

## 2018-10-20 LAB — HEMOGLOBIN A1C: Hgb A1c MFr Bld: 9 % — ABNORMAL HIGH (ref 4.6–6.5)

## 2018-10-20 LAB — TSH: TSH: 2.47 u[IU]/mL (ref 0.35–4.50)

## 2018-10-20 MED ORDER — FLUTICASONE PROPIONATE 50 MCG/ACT NA SUSP: 2.0000 | Freq: Every day | NASAL | 3 refills | Status: DC

## 2018-10-20 MED ORDER — GABAPENTIN 100 MG PO CAPS: 200.0000 mg | ORAL_CAPSULE | Freq: Every day | ORAL | 1 refills | Status: DC

## 2018-10-20 MED ORDER — METOPROLOL SUCCINATE ER 25 MG PO TB24: 12.5000 mg | ORAL_TABLET | Freq: Every day | ORAL | 1 refills | Status: DC

## 2018-10-20 MED ORDER — OMEPRAZOLE 40 MG PO CPDR: DELAYED_RELEASE_CAPSULE | ORAL | 1 refills | Status: DC

## 2018-10-20 MED ORDER — DULOXETINE HCL 60 MG PO CPEP: 60.0000 mg | ORAL_CAPSULE | Freq: Every day | ORAL | 3 refills | Status: DC

## 2018-10-20 MED ORDER — ALMOTRIPTAN MALATE 12.5 MG PO TABS: 12.5000 mg | ORAL_TABLET | ORAL | 3 refills | Status: DC | PRN

## 2018-10-20 NOTE — Assessment & Plan Note (Signed)
Following and management per Dr. Cruzita Lederer Sugars spiked up couple months ago and had pneumonia, but are slowly coming down Continue regular exercise Continue low sugar/carbohydrate diet We will check A1c since we are getting blood work done

## 2018-10-20 NOTE — Assessment & Plan Note (Signed)
Migraines and frequent, controlled Takes axert only as needed Occasionally takes Excedrin Migraine Continue

## 2018-10-20 NOTE — Assessment & Plan Note (Signed)
Controlled, stable Continue current dose of medication  

## 2018-10-20 NOTE — Assessment & Plan Note (Signed)
Chronic pain, especially hips and knees Overall controlled Continue regular exercise Continue Cymbalta at current dose Continue weight loss efforts

## 2018-10-20 NOTE — Assessment & Plan Note (Signed)
She is actively working on weight loss She is exercising and will try increasing her walking Continue low-carb/sugar diet Work on decreasing portions Follow-up in 6 months

## 2018-10-20 NOTE — Assessment & Plan Note (Signed)
Omeprazole QOD GERD controlled - continue above

## 2018-10-21 ENCOUNTER — Encounter: Payer: Self-pay | Admitting: Internal Medicine

## 2018-10-29 ENCOUNTER — Encounter: Payer: Self-pay | Admitting: Internal Medicine

## 2018-11-03 ENCOUNTER — Encounter: Payer: Self-pay | Admitting: Internal Medicine

## 2018-11-03 MED ORDER — SITAGLIPTIN PHOSPHATE 100 MG PO TABS: 100.0000 mg | ORAL_TABLET | Freq: Every day | ORAL | 3 refills | Status: DC

## 2018-12-24 ENCOUNTER — Ambulatory Visit: Payer: Self-pay | Admitting: Internal Medicine

## 2018-12-28 ENCOUNTER — Encounter: Payer: Self-pay | Admitting: Internal Medicine

## 2018-12-29 ENCOUNTER — Encounter: Payer: Self-pay | Admitting: Internal Medicine

## 2018-12-29 MED ORDER — FLUCONAZOLE 150 MG PO TABS: 150.0000 mg | ORAL_TABLET | Freq: Once | ORAL | 0 refills | Status: AC

## 2018-12-29 MED ORDER — AMPHETAMINE-DEXTROAMPHET ER 20 MG PO CP24: 20.0000 mg | ORAL_CAPSULE | Freq: Every day | ORAL | 0 refills | Status: DC

## 2018-12-29 NOTE — Telephone Encounter (Signed)
Last refill was 10/06/18 Last routine OV 10/20/18 Next OV 04/22/19

## 2018-12-29 NOTE — Telephone Encounter (Signed)
Also let me know if you want a visit for the yeast infection part.

## 2019-01-04 ENCOUNTER — Telehealth: Payer: Self-pay | Admitting: Internal Medicine

## 2019-01-04 MED ORDER — INSULIN GLARGINE 100 UNIT/ML SOLOSTAR PEN: PEN_INJECTOR | SUBCUTANEOUS | 1 refills | Status: DC

## 2019-01-04 MED ORDER — INSULIN PEN NEEDLE 32G X 4 MM MISC: 3 refills | Status: DC

## 2019-01-04 NOTE — Telephone Encounter (Signed)
MEDICATION: Lantus and Pen needles  PHARMACY:  [id number]   IS THIS A 90 DAY SUPPLY :   IS PATIENT OUT OF MEDICATION:   IF NOT; HOW MUCH IS LEFT:   LAST APPOINTMENT DATE: @Visit  date not found  NEXT APPOINTMENT DATE:@9 /10/2018  DO WE HAVE YOUR PERMISSION TO LEAVE A DETAILED MESSAGE:  OTHER COMMENTS:    **Let patient know to contact pharmacy at the end of the day to make sure medication is ready. **  ** Please notify patient to allow 48-72 hours to process**  **Encourage patient to contact the pharmacy for refills or they can request refills through [facility]**

## 2019-01-04 NOTE — Telephone Encounter (Signed)
Refills sent

## 2019-01-05 ENCOUNTER — Telehealth: Payer: Self-pay

## 2019-01-05 MED ORDER — BASAGLAR KWIKPEN 100 UNIT/ML ~~LOC~~ SOPN: PEN_INJECTOR | SUBCUTANEOUS | 4 refills | Status: DC

## 2019-01-05 NOTE — Telephone Encounter (Signed)
Basaglar same dose

## 2019-01-05 NOTE — Telephone Encounter (Signed)
Fax from pharmacy stating lantus is not covered and asking if we can change to [place] or Levemir.

## 2019-01-05 NOTE — Telephone Encounter (Signed)
RX sent

## 2019-01-10 ENCOUNTER — Encounter: Payer: Self-pay | Admitting: Internal Medicine

## 2019-01-11 ENCOUNTER — Ambulatory Visit: Payer: Self-pay

## 2019-01-11 ENCOUNTER — Inpatient Hospital Stay (HOSPITAL_COMMUNITY)
Admission: EM | Admit: 2019-01-11 | Discharge: 2019-01-13 | DRG: 871 | Disposition: A | Discharge status: Home / Self Care | Payer: 59 | Attending: Internal Medicine | Admitting: Internal Medicine

## 2019-01-11 ENCOUNTER — Other Ambulatory Visit: Payer: Self-pay

## 2019-01-11 ENCOUNTER — Encounter (HOSPITAL_COMMUNITY): Payer: Self-pay

## 2019-01-11 ENCOUNTER — Telehealth: Payer: Self-pay | Admitting: Internal Medicine

## 2019-01-11 ENCOUNTER — Emergency Department (HOSPITAL_COMMUNITY): Payer: 59

## 2019-01-11 DIAGNOSIS — Z7951 Long term (current) use of inhaled steroids: Secondary | ICD-10-CM

## 2019-01-11 DIAGNOSIS — Z8249 Family history of ischemic heart disease and other diseases of the circulatory system: Secondary | ICD-10-CM

## 2019-01-11 DIAGNOSIS — F419 Anxiety disorder, unspecified: Secondary | ICD-10-CM | POA: Diagnosis present

## 2019-01-11 DIAGNOSIS — J309 Allergic rhinitis, unspecified: Secondary | ICD-10-CM | POA: Diagnosis not present

## 2019-01-11 DIAGNOSIS — R509 Fever, unspecified: Secondary | ICD-10-CM | POA: Diagnosis not present

## 2019-01-11 DIAGNOSIS — Z975 Presence of (intrauterine) contraceptive device: Secondary | ICD-10-CM | POA: Diagnosis not present

## 2019-01-11 DIAGNOSIS — M797 Fibromyalgia: Secondary | ICD-10-CM | POA: Diagnosis not present

## 2019-01-11 DIAGNOSIS — Z91018 Allergy to other foods: Secondary | ICD-10-CM

## 2019-01-11 DIAGNOSIS — Z6837 Body mass index (BMI) 37.0-37.9, adult: Secondary | ICD-10-CM | POA: Diagnosis not present

## 2019-01-11 DIAGNOSIS — E669 Obesity, unspecified: Secondary | ICD-10-CM | POA: Diagnosis present

## 2019-01-11 DIAGNOSIS — E1165 Type 2 diabetes mellitus with hyperglycemia: Secondary | ICD-10-CM | POA: Diagnosis not present

## 2019-01-11 DIAGNOSIS — Z888 Allergy status to other drugs, medicaments and biological substances status: Secondary | ICD-10-CM

## 2019-01-11 DIAGNOSIS — Z20828 Contact with and (suspected) exposure to other viral communicable diseases: Secondary | ICD-10-CM | POA: Diagnosis not present

## 2019-01-11 DIAGNOSIS — R69 Illness, unspecified: Secondary | ICD-10-CM | POA: Diagnosis not present

## 2019-01-11 DIAGNOSIS — A419 Sepsis, unspecified organism: Secondary | ICD-10-CM | POA: Diagnosis not present

## 2019-01-11 DIAGNOSIS — E1110 Type 2 diabetes mellitus with ketoacidosis without coma: Secondary | ICD-10-CM | POA: Diagnosis not present

## 2019-01-11 DIAGNOSIS — J302 Other seasonal allergic rhinitis: Secondary | ICD-10-CM | POA: Diagnosis present

## 2019-01-11 DIAGNOSIS — K529 Noninfective gastroenteritis and colitis, unspecified: Secondary | ICD-10-CM

## 2019-01-11 DIAGNOSIS — Z91010 Allergy to peanuts: Secondary | ICD-10-CM

## 2019-01-11 DIAGNOSIS — Z91048 Other nonmedicinal substance allergy status: Secondary | ICD-10-CM

## 2019-01-11 DIAGNOSIS — R197 Diarrhea, unspecified: Secondary | ICD-10-CM

## 2019-01-11 DIAGNOSIS — Z87891 Personal history of nicotine dependence: Secondary | ICD-10-CM

## 2019-01-11 DIAGNOSIS — F909 Attention-deficit hyperactivity disorder, unspecified type: Secondary | ICD-10-CM | POA: Diagnosis present

## 2019-01-11 DIAGNOSIS — N390 Urinary tract infection, site not specified: Secondary | ICD-10-CM | POA: Diagnosis not present

## 2019-01-11 DIAGNOSIS — R112 Nausea with vomiting, unspecified: Secondary | ICD-10-CM | POA: Diagnosis present

## 2019-01-11 DIAGNOSIS — K760 Fatty (change of) liver, not elsewhere classified: Secondary | ICD-10-CM | POA: Diagnosis present

## 2019-01-11 DIAGNOSIS — K219 Gastro-esophageal reflux disease without esophagitis: Secondary | ICD-10-CM | POA: Diagnosis present

## 2019-01-11 DIAGNOSIS — R0602 Shortness of breath: Secondary | ICD-10-CM | POA: Diagnosis not present

## 2019-01-11 DIAGNOSIS — Z794 Long term (current) use of insulin: Secondary | ICD-10-CM

## 2019-01-11 DIAGNOSIS — Z79899 Other long term (current) drug therapy: Secondary | ICD-10-CM

## 2019-01-11 DIAGNOSIS — R000 Tachycardia, unspecified: Secondary | ICD-10-CM | POA: Diagnosis not present

## 2019-01-11 DIAGNOSIS — R6510 Systemic inflammatory response syndrome (SIRS) of non-infectious origin without acute organ dysfunction: Secondary | ICD-10-CM | POA: Diagnosis present

## 2019-01-11 DIAGNOSIS — Z833 Family history of diabetes mellitus: Secondary | ICD-10-CM

## 2019-01-11 DIAGNOSIS — E1169 Type 2 diabetes mellitus with other specified complication: Secondary | ICD-10-CM | POA: Diagnosis present

## 2019-01-11 DIAGNOSIS — D509 Iron deficiency anemia, unspecified: Secondary | ICD-10-CM | POA: Diagnosis present

## 2019-01-11 LAB — COMPREHENSIVE METABOLIC PANEL
ALT: 67 U/L — ABNORMAL HIGH (ref 0–44)
AST: 33 U/L (ref 15–41)
Albumin: 4 g/dL (ref 3.5–5.0)
Alkaline Phosphatase: 138 U/L — ABNORMAL HIGH (ref 38–126)
Anion gap: 16 — ABNORMAL HIGH (ref 5–15)
BUN: 13 mg/dL (ref 6–20)
CO2: 18 mmol/L — ABNORMAL LOW (ref 22–32)
Calcium: 9.6 mg/dL (ref 8.9–10.3)
Chloride: 98 mmol/L (ref 98–111)
Creatinine, Ser: 0.96 mg/dL (ref 0.44–1.00)
GFR calc Af Amer: >60 mL/min (ref >60)
GFR calc non Af Amer: >60 mL/min (ref >60)
Glucose, Bld: 303 mg/dL — ABNORMAL HIGH (ref 70–99)
Potassium: 4 mmol/L (ref 3.5–5.1)
Sodium: 132 mmol/L — ABNORMAL LOW (ref 135–145)
Total Bilirubin: 0.7 mg/dL (ref 0.3–1.2)
Total Protein: 8 g/dL (ref 6.5–8.1)

## 2019-01-11 LAB — POCT I-STAT EG7
Acid-base deficit: 3 mmol/L — ABNORMAL HIGH (ref 0.0–2.0)
Bicarbonate: 20.1 mmol/L (ref 20.0–28.0)
Calcium, Ion: 1.17 mmol/L (ref 1.15–1.40)
HCT: 37 % (ref 36.0–46.0)
Hemoglobin: 12.6 g/dL (ref 12.0–15.0)
O2 Saturation: 86 %
Potassium: 3.4 mmol/L — ABNORMAL LOW (ref 3.5–5.1)
Sodium: 137 mmol/L (ref 135–145)
TCO2: 21 mmol/L — ABNORMAL LOW (ref 22–32)
pCO2, Ven: 28.1 mmHg — ABNORMAL LOW (ref 44.0–60.0)
pH, Ven: 7.462 — ABNORMAL HIGH (ref 7.250–7.430)
pO2, Ven: 47 mmHg — ABNORMAL HIGH (ref 32.0–45.0)

## 2019-01-11 LAB — LACTIC ACID, PLASMA
Lactic Acid, Venous: 2.5 mmol/L (ref 0.5–1.9)
Lactic Acid, Venous: 2.3 mmol/L (ref 0.5–1.9)

## 2019-01-11 LAB — CBC WITH DIFFERENTIAL/PLATELET
Abs Immature Granulocytes: 0.01 10*3/uL (ref 0.00–0.07)
Basophils Absolute: 0 10*3/uL (ref 0.0–0.1)
Basophils Relative: 0 %
Eosinophils Absolute: 0.2 10*3/uL (ref 0.0–0.5)
Eosinophils Relative: 2 %
HCT: 45.9 % (ref 36.0–46.0)
Hemoglobin: 14.3 g/dL (ref 12.0–15.0)
Immature Granulocytes: 0 %
Lymphocytes Relative: 27 %
Lymphs Abs: 2.1 10*3/uL (ref 0.7–4.0)
MCH: 24.3 pg — ABNORMAL LOW (ref 26.0–34.0)
MCHC: 31.2 g/dL (ref 30.0–36.0)
MCV: 77.9 fL — ABNORMAL LOW (ref 80.0–100.0)
Monocytes Absolute: 1.1 10*3/uL — ABNORMAL HIGH (ref 0.1–1.0)
Monocytes Relative: 14 %
Neutro Abs: 4.4 10*3/uL (ref 1.7–7.7)
Neutrophils Relative %: 57 %
Platelets: 444 10*3/uL — ABNORMAL HIGH (ref 150–400)
RBC: 5.89 MIL/uL — ABNORMAL HIGH (ref 3.87–5.11)
RDW: 15.9 % — ABNORMAL HIGH (ref 11.5–15.5)
WBC: 7.8 10*3/uL (ref 4.0–10.5)
nRBC: 0 % (ref 0.0–0.2)

## 2019-01-11 LAB — PROTIME-INR
INR: 1.1 (ref 0.8–1.2)
Prothrombin Time: 13.8 seconds (ref 11.4–15.2)

## 2019-01-11 LAB — LIPASE, BLOOD: Lipase: 27 U/L (ref 11–51)

## 2019-01-11 LAB — SARS CORONAVIRUS 2 BY RT PCR (HOSPITAL ORDER, PERFORMED IN ~~LOC~~ HOSPITAL LAB): SARS Coronavirus 2: NEGATIVE

## 2019-01-11 LAB — I-STAT BETA HCG BLOOD, ED (MC, WL, AP ONLY): I-stat hCG, quantitative: <5 m[IU]/mL (ref <5)

## 2019-01-11 LAB — CBG MONITORING, ED: Glucose-Capillary: 249 mg/dL — ABNORMAL HIGH (ref 70–99)

## 2019-01-11 MED ORDER — SODIUM CHLORIDE 0.9 % IV SOLN
2.0000 g | Freq: Once | INTRAVENOUS | Status: AC
  Administered 2019-01-11: 2 g via INTRAVENOUS
  Filled 2019-01-11: qty 2

## 2019-01-11 MED ORDER — VANCOMYCIN HCL IN DEXTROSE 1-5 GM/200ML-% IV SOLN
1000.0000 mg | Freq: Once | INTRAVENOUS | Status: DC
  Filled 2019-01-11: qty 200

## 2019-01-11 MED ORDER — INSULIN ASPART 100 UNIT/ML ~~LOC~~ SOLN
0.0000 [IU] | SUBCUTANEOUS | Status: DC
  Administered 2019-01-12: 2 [IU] via SUBCUTANEOUS
  Administered 2019-01-12: 3 [IU] via SUBCUTANEOUS
  Administered 2019-01-12: 1 [IU] via SUBCUTANEOUS
  Administered 2019-01-13 (×2): 2 [IU] via SUBCUTANEOUS
  Administered 2019-01-13: 1 [IU] via SUBCUTANEOUS

## 2019-01-11 MED ORDER — ONDANSETRON HCL 4 MG/2ML IJ SOLN: 4.0000 mg | Freq: Four times a day (QID) | INTRAMUSCULAR | Status: DC | PRN

## 2019-01-11 MED ORDER — METRONIDAZOLE IN NACL 5-0.79 MG/ML-% IV SOLN
500.0000 mg | Freq: Once | INTRAVENOUS | Status: AC
  Administered 2019-01-11: 500 mg via INTRAVENOUS
  Filled 2019-01-11: qty 100

## 2019-01-11 MED ORDER — VANCOMYCIN HCL 10 G IV SOLR
1500.0000 mg | INTRAVENOUS | Status: DC
  Filled 2019-01-11: qty 1500

## 2019-01-11 MED ORDER — ACETAMINOPHEN 325 MG PO TABS
650.0000 mg | ORAL_TABLET | Freq: Four times a day (QID) | ORAL | Status: DC | PRN
  Administered 2019-01-12 (×2): 650 mg via ORAL
  Filled 2019-01-11 (×2): qty 2

## 2019-01-11 MED ORDER — FLUCONAZOLE 150 MG PO TABS: 150.0000 mg | ORAL_TABLET | Freq: Every day | ORAL | 0 refills | Status: AC

## 2019-01-11 MED ORDER — VANCOMYCIN HCL 10 G IV SOLR
2000.0000 mg | Freq: Once | INTRAVENOUS | Status: AC
  Administered 2019-01-11: 2000 mg via INTRAVENOUS
  Filled 2019-01-11: qty 2000

## 2019-01-11 MED ORDER — SODIUM CHLORIDE 0.9 % IV BOLUS (SEPSIS)
800.0000 mL | Freq: Once | INTRAVENOUS | Status: AC
  Administered 2019-01-11: 800 mL via INTRAVENOUS

## 2019-01-11 MED ORDER — INSULIN ASPART 100 UNIT/ML ~~LOC~~ SOLN
10.0000 [IU] | Freq: Once | SUBCUTANEOUS | Status: AC
  Administered 2019-01-11: 10 [IU] via INTRAVENOUS

## 2019-01-11 MED ORDER — SODIUM CHLORIDE 0.9 % IV SOLN
2.0000 g | Freq: Three times a day (TID) | INTRAVENOUS | Status: DC
  Administered 2019-01-12: 2 g via INTRAVENOUS
  Filled 2019-01-11: qty 2

## 2019-01-11 MED ORDER — ONDANSETRON HCL 4 MG PO TABS: 4.0000 mg | ORAL_TABLET | Freq: Four times a day (QID) | ORAL | Status: DC | PRN

## 2019-01-11 MED ORDER — IOHEXOL 300 MG/ML  SOLN
100.0000 mL | Freq: Once | INTRAMUSCULAR | Status: AC | PRN
  Administered 2019-01-11: 100 mL via INTRAVENOUS

## 2019-01-11 MED ORDER — ACETAMINOPHEN 650 MG RE SUPP: 650.0000 mg | Freq: Four times a day (QID) | RECTAL | Status: DC | PRN

## 2019-01-11 MED ORDER — SODIUM CHLORIDE 0.9 % IV BOLUS (SEPSIS)
1000.0000 mL | Freq: Once | INTRAVENOUS | Status: AC
  Administered 2019-01-11: 1000 mL via INTRAVENOUS

## 2019-01-11 MED ORDER — SODIUM CHLORIDE 0.9 % IV SOLN
INTRAVENOUS | Status: AC
  Administered 2019-01-12: 10:00:00 via INTRAVENOUS

## 2019-01-11 NOTE — ED Notes (Signed)
Lactic Acid 2.5  °

## 2019-01-11 NOTE — Telephone Encounter (Signed)
Patient called to advise that her Blood Sugars have been running over 300 for the last several days.  She is having N/V/D and upper abdominal pain that is radiating to her mid and lower back.    Would like a call to advise how to proceed?  Please call at [phone]

## 2019-01-11 NOTE — ED Provider Notes (Signed)
[facility] EMERGENCY DEPARTMENT Provider Note   CSN: [id number] Arrival date & time: 01/11/19  1620    History   Chief Complaint Chief Complaint  Patient presents with  . Abdominal Pain    HPI Lisa Duran is a 38 y.o. female.            38 year old female history of type 2 diabetes, fibromyalgia presents today complaining of nausea vomiting diarrhea and upper abdominal pain that is been present for 3 days.  States on Saturday she thought that she ate something that made her sick.  She began having some crampy upper abdominal discomfort followed by nausea and vomiting with some loose stools.  She states that she has only Some small sips of fluid and low-salt teaching Saturday.  She was seen in urgent care and sent here due to tachycardia.  She states her blood sugars have been running higher over 300.  She feels that she has had some chills but has not had fever.  She states she was in the hospital with pneumonia in January and feels somewhat like she thought that but is not having a cough.  She has been staying at home with her family and not going to work through the [place] pandemic.  However, she has seen her family and her sister is a nurse who is taking care of covered patients.  She reports she has had decreased urine output and has not had any pain or frequency with urination.  Patient reports having IUD in place and has not been sexually active for several years Past Medical History:  Diagnosis Date  . ADHD (attention deficit hyperactivity disorder)   . ALLERGIC RHINITIS   . Allergy   . Anemia   . ANXIETY   . Depression   . DIABETES MELLITUS, TYPE II   . Fibromyalgia 2018  . GERD (gastroesophageal reflux disease)   . MIGRAINE, COMMON   . Obesity, unspecified   . OVARIAN CYST     Patient Active Problem List   Diagnosis Date Noted  . Cough 08/02/2018  . Strain of Achilles tendon, right, initial encounter 01/01/2018  . Sleep-related headache  04/06/2017  . Sleep walking disorder 04/06/2017  . Iron deficiency anemia 03/27/2017  . Arthralgia 02/03/2017  . Fibromyalgia 02/03/2017  . GERD (gastroesophageal reflux disease) 06/24/2015  . Eczema 06/22/2015  . Depression 06/22/2015  . Tachycardia 06/16/2014  . Paresthesias 06/12/2014  . Food allergy   . Wrist pain, right 09/14/2013  . Elbow pain, right 09/05/2013  . ADHD (attention deficit hyperactivity disorder) 01/27/2011  . Anxiety 02/20/2010  . ALLERGIC RHINITIS 11/21/2009  . OVARIAN CYST 11/21/2009  . Diabetes mellitus type 2 with neurological manifestations ([place]) 11/20/2009  . Class 2 obesity in adult 11/20/2009  . Migraine without aura 11/20/2009    Past Surgical History:  Procedure Laterality Date  . NO PAST SURGERIES       OB History   No obstetric history on file.      Home Medications    Prior to Admission medications   Medication Sig Start Date End Date Taking? Authorizing Provider  almotriptan (AXERT) 12.5 MG tablet Take 1 tablet (12.5 mg total) by mouth as needed for migraine. may repeat in 2 hours if needed 10/20/18   Binnie Rail, MD  ALPRAZolam Duanne Moron) 0.5 MG tablet TAKE 1 TABLET(0.5 MG) BY MOUTH THREE TIMES DAILY AS NEEDED FOR ANXIETY 10/05/18   Binnie Rail, MD  amphetamine-dextroamphetamine (ADDERALL XR) 20 MG 24 hr capsule  Take 1 capsule (20 mg total) by mouth daily. 12/29/18   Binnie Rail, MD  DULoxetine (CYMBALTA) 60 MG capsule Take 1 capsule (60 mg total) by mouth daily. 10/20/18   Binnie Rail, MD  fluconazole (DIFLUCAN) 150 MG tablet Take 1 tablet (150 mg total) by mouth daily for 3 days. 01/11/19 01/14/19  Binnie Rail, MD  fluticasone (FLONASE) 50 MCG/ACT nasal spray Place 2 sprays into both nostrils daily. 10/20/18   Binnie Rail, MD  gabapentin (NEURONTIN) 100 MG capsule Take 2 capsules (200 mg total) by mouth at bedtime. 10/20/18   Binnie Rail, MD  glipiZIDE (GLUCOTROL XL) 5 MG 24 hr tablet Take 1 tablet (5 mg total) by mouth daily  with breakfast. 01/22/18   Philemon Kingdom, MD  glucose blood (ONETOUCH VERIO) test strip Use 2x a day 01/22/18   Philemon Kingdom, MD  Insulin Glargine [facility]) 100 UNIT/ML SOPN Use to inject 32 units at bedtime 01/05/19   Philemon Kingdom, MD  Insulin Pen Needle (BD PEN NEEDLE NANO U/F) 32G X 4 MM MISC USE ONCE A DAY 01/04/19   Philemon Kingdom, MD  iron polysaccharides (NIFEREX) 150 MG capsule Take 1 capsule (150 mg total) by mouth daily. 05/27/17   Brunetta Genera, MD  lamoTRIgine (LAMICTAL) 100 MG tablet TAKE 1 TABLET BY MOUTH TWO  TIMES DAILY 08/05/16   Binnie Rail, MD  levonorgestrel (MIRENA) 20 MCG/24HR IUD 1 Intra Uterine Device (1 each total) by Intrauterine route once. 10/03/13   Rowe Clack, MD  metFORMIN (GLUCOPHAGE) 1000 MG tablet TAKE 1 TABLET BY  MOUTH TWICE A DAY WITH A  MEAL 01/22/18   Philemon Kingdom, MD  metoprolol succinate (TOPROL-XL) 25 MG 24 hr tablet Take 0.5 tablets (12.5 mg total) by mouth daily. 10/20/18   Binnie Rail, MD  omeprazole (PRILOSEC) 40 MG capsule TAKE 1 CAPSULE BY MOUTH  DAILY BEFORE SUPPER 10/20/18   Burns, Claudina Lick, MD  Prenatal Vit-Fe Fumarate-FA (MULTIVITAMIN-PRENATAL) 27-0.8 MG TABS tablet Take 1 tablet by mouth daily at 12 noon.    [provider]  sitaGLIPtin (JANUVIA) 100 MG tablet Take 1 tablet (100 mg total) by mouth daily. 11/03/18   Philemon Kingdom, MD  triamcinolone cream (KENALOG) 0.1 % Apply topically 2 (two) times daily as needed. 06/20/14   Rowe Clack, MD  vitamin B-12 (CYANOCOBALAMIN) 1000 MCG tablet Take 1,000 mcg by mouth daily.    [provider]    Family History Family History  Problem Relation Age of Onset  . Diabetes Mother   . Hyperlipidemia Mother   . Diabetes Father   . Hyperlipidemia Father   . Coronary artery disease Father   . Hypertension Father   . Cancer Father        esoph  . Stroke Father   . Cancer Maternal Grandfather 37       Prostate and Lung  . Diabetes  Sister        Gestational  . Berenice Primas' disease Maternal Aunt   . Cancer Maternal Uncle        Brain  . Stroke Paternal Grandmother   . Heart disease Paternal Grandmother   . Stroke Paternal Grandfather   . Heart disease Paternal Grandfather   . Diabetes Sister        Gestational and Type II  . Hashimoto's thyroiditis Cousin   . Cancer Maternal Uncle        Brain    Social History Social History  Tobacco Use  . Smoking status: Former Smoker    Last attempt to quit: 08/20/2002    Years since quitting: 16.4  . Smokeless tobacco: Never Used  . Tobacco comment: single, separated from spouse 02/2010.   Substance Use Topics  . Alcohol use: Yes    Comment: rarely  . Drug use: No     Allergies   Propofol; Chicken allergy; Gluten meal; Other; and Victoza [liraglutide]   Review of Systems Review of Systems  All other systems reviewed and are negative.    Physical Exam Updated Vital Signs BP 109/79 (BP Location: Right Arm)   Pulse (!) 131   Temp 98.1 F (36.7 C) (Oral)   Resp 16   Ht 1.626 m (5\' 4" )   Wt 98.4 kg   SpO2 99%   BMI 37.25 kg/m    Physical Exam Vitals signs and nursing note reviewed.  Constitutional:      General: She is not in acute distress.    Appearance: She is well-developed. She is ill-appearing.  HENT:     Head: Normocephalic.     Mouth/Throat:     Mouth: Mucous membranes are moist.  Eyes:     Extraocular Movements: Extraocular movements intact.  Cardiovascular:     Rate and Rhythm: Tachycardia present.     Heart sounds: Normal heart sounds.  Pulmonary:     Effort: Pulmonary effort is normal.     Breath sounds: Normal breath sounds.  Abdominal:     General: Bowel sounds are normal.     Palpations: Abdomen is soft.     Tenderness: There is abdominal tenderness in the epigastric area.     Comments: Abdomen is obese with normal bowel sounds There is mild epigastric tenderness  Skin:    General: Skin is warm.  Neurological:     General:  No focal deficit present.     Mental Status: She is alert.  Psychiatric:        Mood and Affect: Mood normal.      ED Treatments / Results  Labs (all labs ordered are listed, but only abnormal results are displayed) Labs Reviewed  COMPREHENSIVE METABOLIC PANEL - Abnormal; Notable for the following components:      Result Value   Sodium 132 (*)    CO2 18 (*)    Glucose, Bld 303 (*)    ALT 67 (*)    Alkaline Phosphatase 138 (*)    Anion gap 16 (*)    All other components within normal limits  LACTIC ACID, PLASMA - Abnormal; Notable for the following components:   Lactic Acid, Venous 2.5 (*)    All other components within normal limits  CBC WITH DIFFERENTIAL/PLATELET - Abnormal; Notable for the following components:   RBC 5.89 (*)    MCV 77.9 (*)    MCH 24.3 (*)    RDW 15.9 (*)    Platelets 444 (*)    Monocytes Absolute 1.1 (*)    All other components within normal limits  CULTURE, BLOOD (ROUTINE X 2)  CULTURE, BLOOD (ROUTINE X 2)  PROTIME-INR  LACTIC ACID, PLASMA  URINALYSIS, ROUTINE W REFLEX MICROSCOPIC  I-STAT BETA HCG BLOOD, ED ([facility], [facility], [facility] ONLY)    EKG EKG Interpretation  Date/Time:  Tuesday January 11 2019 16:44:36 EDT Ventricular Rate:  151 PR Interval:    QRS Duration: 66 QT Interval:  310 QTC Calculation: 491 R Axis:   43 Text Interpretation:  Sinus tachycardia Non-specific ST-t changes Confirmed by Pattricia Boss (  [id number]) on 01/11/2019 8:01:33 PM   Radiology No results found.  Procedures .Critical Care Performed by: Pattricia Boss, MD Authorized by: Pattricia Boss, MD   Critical care provider statement:    Critical care time (minutes):  45   Critical care end time:  01/12/2019 12:13 PM   Critical care was necessary to treat or prevent imminent or life-threatening deterioration of the following conditions:  Dehydration and sepsis   Critical care was time spent personally by me on the following activities:  Discussions with consultants, evaluation of  patient's response to treatment, examination of patient, ordering and performing treatments and interventions, ordering and review of laboratory studies, ordering and review of radiographic studies, pulse oximetry, re-evaluation of patient's condition, obtaining history from patient or surrogate and review of old charts   (including critical care time)  Medications Ordered in ED Medications - No data to display   Initial Impression / Assessment and Plan / ED Course  I have reviewed the triage vital signs and the nursing notes.  Pertinent labs & imaging results that were available during my care of the patient were reviewed by me and considered in my medical decision making (see chart for details).     Sepsis-Broad 30 cc/kg saline bolus, broad-spectrum antibiotics, chest x-Abrian Hanover, and will admit for possible sepsis- likely intraabdominal etiology  Hyperglycemia/ early DKA-Blood sugar elevated Anion gap is 16 Will check VBG and start insulin Recheck bs 247 Patient with ph 7.46  CT abdomen/pelvis results pending Urine pending  Vitals:   01/11/19 1639 01/11/19 1934  BP: (!) 128/95 109/79  Pulse: (!) 150 (!) 131  Resp: (!) 24 16  Temp: 98.4 F (36.9 C) 98.1 F (36.7 C)  SpO2: 99% 99%   Discussed with Dr Hal Hope and will see for admission  Final Clinical Impressions(s) / ED Diagnoses   Final diagnoses:  Sepsis, due to unspecified organism, unspecified whether acute organ dysfunction present [facility])  Diabetic ketoacidosis without coma associated with type 2 diabetes mellitus [facility])    ED Discharge Orders    None      Pattricia Boss, MD 01/12/19 1214

## 2019-01-11 NOTE — Telephone Encounter (Signed)
FYI was advised to go to ED

## 2019-01-11 NOTE — Telephone Encounter (Signed)
Notified patient of Dr. Cruzita Lederer response and advised her to try and get in touch with PCP asap but due to how much vomiting and high blood sugars she should go to the nearest Urgent Care for evaluation. Patient expressed understanding and agreement, will try PCP office again now.

## 2019-01-11 NOTE — H&P (Signed)
History and Physical    Lisa Duran LFY:[id number] DOB: 11/14/1980 DOA: 01/11/2019  PCP: Binnie Rail, MD  Patient coming from: Home.  Chief Complaint: Nausea vomiting diarrhea elevated blood sugar.  HPI: Lisa Duran is a 38 y.o. female with history of diabetes mellitus type 2, migraine, fibromyalgia, depression, ADHD has been experiencing nausea vomiting diarrhea with epigastric pain for the last 4 days.  Denies any blood in the vomitus or diarrhea.  Subjective feeling of fever chills.  Denies chest pain or shortness of breath.  Has been taking her insulin despite patient has been not eating well but unable to keep anything.  Had gone to urgent care center and was found to have elevated blood sugar and concern for pain was referred to the ER for possible DKA.  ED Course: In the ER patient was tachycardic with a heart rate in the 130s to 140s with complete metabolic panel showing sodium of 132 bicarb 18 blood glucose 303 anion gap 16 CBC showing WBC of 7.8 hemoglobin 14.3 platelets 444 lactate was 2.5.  Chest x-ray unremarkable UA pending.  CT abdomen pelvis done shows features concerning for enteritis.  Given the patient's tachycardia and elevated lactate symptoms are concerning for developing sepsis patient was placed on antibiotics.  Fluid bolus was given for sepsis protocol.  Review of Systems: As per HPI, rest all negative.   Past Medical History:  Diagnosis Date  . ADHD (attention deficit hyperactivity disorder)   . ALLERGIC RHINITIS   . Allergy   . Anemia   . ANXIETY   . Depression   . DIABETES MELLITUS, TYPE II   . Fibromyalgia 2018  . GERD (gastroesophageal reflux disease)   . MIGRAINE, COMMON   . Obesity, unspecified   . OVARIAN CYST     Past Surgical History:  Procedure Laterality Date  . NO PAST SURGERIES       reports that she quit smoking about 16 years ago. She has never used smokeless tobacco. She reports current alcohol use. She reports that she does not use  drugs.  Allergies  Allergen Reactions  . Chicken Allergy Anaphylaxis and Shortness Of Breath  . Other Itching, Swelling and Other (See Comments)    NUTS (of ANY kind): Lips and mouth swell, but no breathing impairment & migraines and eczema are triggered  . Propofol Shortness Of Breath    SOB, chest tightness, wheezing after colonoscopy on 09-18-15  . Adhesive [Tape] Other (See Comments)    Makes the skin VERY RED   . Gluten Meal Diarrhea  . Peanut-Containing Drug Products Itching, Swelling and Other (See Comments)    Lips and mouth swell, but no breathing impairment & migraines and eczema are triggered   . Victoza [Liraglutide] Rash    Family History  Problem Relation Age of Onset  . Diabetes Mother   . Hyperlipidemia Mother   . Diabetes Father   . Hyperlipidemia Father   . Coronary artery disease Father   . Hypertension Father   . Cancer Father        esoph  . Stroke Father   . Cancer Maternal Grandfather 29       Prostate and Lung  . Diabetes Sister        Gestational  . Berenice Primas' disease Maternal Aunt   . Cancer Maternal Uncle        Brain  . Stroke Paternal Grandmother   . Heart disease Paternal Grandmother   . Stroke Paternal Grandfather   . Heart  disease Paternal Grandfather   . Diabetes Sister        Gestational and Type II  . Hashimoto's thyroiditis Cousin   . Cancer Maternal Uncle        Brain    Prior to Admission medications   Medication Sig Start Date End Date Taking? Authorizing Provider  almotriptan (AXERT) 12.5 MG tablet Take 1 tablet (12.5 mg total) by mouth as needed for migraine. may repeat in 2 hours if needed 10/20/18  Yes Burns, Claudina Lick, MD  ALPRAZolam (XANAX) 0.5 MG tablet TAKE 1 TABLET(0.5 MG) BY MOUTH THREE TIMES DAILY AS NEEDED FOR ANXIETY Patient taking differently: Take 0.5 mg by mouth 3 (three) times daily as needed for anxiety.  10/05/18  Yes Burns, Claudina Lick, MD  amphetamine-dextroamphetamine (ADDERALL XR) 20 MG 24 hr capsule Take 1 capsule  (20 mg total) by mouth daily. Patient taking differently: Take 20 mg by mouth See admin instructions. Take 20 mg by mouth once a day only on school or work days 12/29/18  Yes Burns, Claudina Lick, MD  DULoxetine (CYMBALTA) 60 MG capsule Take 1 capsule (60 mg total) by mouth daily. 10/20/18  Yes Burns, Claudina Lick, MD  fluconazole (DIFLUCAN) 150 MG tablet Take 1 tablet (150 mg total) by mouth daily for 3 days. 01/11/19 01/14/19 Yes Burns, Claudina Lick, MD  fluticasone (FLONASE) 50 MCG/ACT nasal spray Place 2 sprays into both nostrils daily. 10/20/18  Yes Burns, Claudina Lick, MD  gabapentin (NEURONTIN) 100 MG capsule Take 2 capsules (200 mg total) by mouth at bedtime. 10/20/18  Yes Burns, Claudina Lick, MD  glipiZIDE (GLUCOTROL XL) 5 MG 24 hr tablet Take 1 tablet (5 mg total) by mouth daily with breakfast. 01/22/18  Yes Philemon Kingdom, MD  Insulin Glargine (BASAGLAR KWIKPEN) 100 UNIT/ML SOPN Use to inject 32 units at bedtime 01/05/19  Yes Philemon Kingdom, MD  lamoTRIgine (LAMICTAL) 100 MG tablet TAKE 1 TABLET BY MOUTH TWO  TIMES DAILY Patient taking differently: Take 100 mg by mouth daily.  08/05/16  Yes Burns, Claudina Lick, MD  levonorgestrel (MIRENA) 20 MCG/24HR IUD 1 Intra Uterine Device (1 each total) by Intrauterine route once. 10/03/13  Yes Rowe Clack, MD  metFORMIN (GLUCOPHAGE) 1000 MG tablet TAKE 1 TABLET BY  MOUTH TWICE A DAY WITH A  MEAL Patient taking differently: Take 1,000 mg by mouth 2 (two) times daily with a meal.  01/22/18  Yes Philemon Kingdom, MD  metoprolol succinate (TOPROL-XL) 25 MG 24 hr tablet Take 0.5 tablets (12.5 mg total) by mouth daily. 10/20/18  Yes Burns, Claudina Lick, MD  omeprazole (PRILOSEC) 40 MG capsule TAKE 1 CAPSULE BY MOUTH  DAILY BEFORE SUPPER Patient taking differently: Take 40 mg by mouth daily before supper.  10/20/18  Yes Burns, Claudina Lick, MD  ondansetron (ZOFRAN-ODT) 4 MG disintegrating tablet Take 4 mg by mouth every 8 (eight) hours as needed for nausea or vomiting.   Yes [provider]  Prenatal Vit-Fe Fumarate-FA (MULTIVITAMIN-PRENATAL) 27-0.8 MG TABS tablet Take 1 tablet by mouth every other day.    Yes [provider]  sitaGLIPtin (JANUVIA) 100 MG tablet Take 1 tablet (100 mg total) by mouth daily. 11/03/18  Yes Philemon Kingdom, MD  triamcinolone cream (KENALOG) 0.1 % Apply topically 2 (two) times daily as needed. Patient taking differently: Apply 1 application topically 2 (two) times daily as needed (for itching).  06/20/14  Yes Rowe Clack, MD  glucose blood (ONETOUCH VERIO) test strip Use 2x a day 01/22/18  Philemon Kingdom, MD  Insulin Pen Needle (BD PEN NEEDLE NANO U/F) 32G X 4 MM MISC USE ONCE A DAY 01/04/19   Philemon Kingdom, MD  iron polysaccharides (NIFEREX) 150 MG capsule Take 1 capsule (150 mg total) by mouth daily. Patient not taking: Reported on 01/11/2019 05/27/17   Brunetta Genera, MD    Physical Exam: Vitals:   01/11/19 1639 01/11/19 1934 01/11/19 1941  BP: (!) 128/95 109/79   Pulse: (!) 150 (!) 131   Resp: (!) 24 16   Temp: 98.4 F (36.9 C) 98.1 F (36.7 C)   TempSrc: Oral Oral   SpO2: 99% 99%   Weight:   98.4 kg  Height:   5\' 4"  (1.626 m)      Constitutional: Moderately built and nourished. Vitals:   01/11/19 1639 01/11/19 1934 01/11/19 1941  BP: (!) 128/95 109/79   Pulse: (!) 150 (!) 131   Resp: (!) 24 16   Temp: 98.4 F (36.9 C) 98.1 F (36.7 C)   TempSrc: Oral Oral   SpO2: 99% 99%   Weight:   98.4 kg  Height:   5\' 4"  (1.626 m)   Eyes: Anicteric no pallor. ENMT: No discharge from the ears eyes nose or mouth. Neck: No mass felt.  No neck rigidity. Respiratory: No rhonchi or crepitations. Cardiovascular: S1-S2 heard. Abdomen: Soft mild epigastric tenderness no guarding or rigidity. Musculoskeletal: No edema. Skin: No rash. Neurologic: Alert awake oriented to time place and person.  Moves all extremities. Psychiatric: Appears normal.  Normal affect.   Labs on Admission: I have personally reviewed  following labs and imaging studies  CBC: Recent Labs  Lab 01/11/19 1651 01/11/19 2225  WBC 7.8  --   NEUTROABS 4.4  --   HGB 14.3 12.6  HCT 45.9 37.0  MCV 77.9*  --   PLT 444*  --    Basic Metabolic Panel: Recent Labs  Lab 01/11/19 1651 01/11/19 2225  NA 132* 137  K 4.0 3.4*  CL 98  --   CO2 18*  --   GLUCOSE 303*  --   BUN 13  --   CREATININE 0.96  --   CALCIUM 9.6  --    GFR: Estimated Creatinine Clearance: 88.8 mL/min (by C-G formula based on SCr of 0.96 mg/dL). Liver Function Tests: Recent Labs  Lab 01/11/19 1651  AST 33  ALT 67*  ALKPHOS 138*  BILITOT 0.7  PROT 8.0  ALBUMIN 4.0   Recent Labs  Lab 01/11/19 2055  LIPASE 27   No results for input(s): AMMONIA in the last 168 hours. Coagulation Profile: Recent Labs  Lab 01/11/19 1651  INR 1.1   Cardiac Enzymes: No results for input(s): CKTOTAL, CKMB, CKMBINDEX, TROPONINI in the last 168 hours. BNP (last 3 results) No results for input(s): PROBNP in the last 8760 hours. HbA1C: No results for input(s): HGBA1C in the last 72 hours. CBG: Recent Labs  Lab 01/11/19 2105  GLUCAP 249*   Lipid Profile: No results for input(s): CHOL, HDL, LDLCALC, TRIG, CHOLHDL, LDLDIRECT in the last 72 hours. Thyroid Function Tests: No results for input(s): TSH, T4TOTAL, FREET4, T3FREE, THYROIDAB in the last 72 hours. Anemia Panel: No results for input(s): VITAMINB12, FOLATE, FERRITIN, TIBC, IRON, RETICCTPCT in the last 72 hours. Urine analysis:    Component Value Date/Time   COLORURINE YELLOW 06/19/2018 2321   APPEARANCEUR CLEAR 06/19/2018 2321   LABSPEC 1.025 06/19/2018 2321   PHURINE 6.5 06/19/2018 [place] 06/19/2018 2321  GLUCOSEU NEGATIVE 01/26/2012 0859   HGBUR MODERATE (A) 06/19/2018 2321   BILIRUBINUR SMALL (A) 06/19/2018 2321   KETONESUR >80 (A) 06/19/2018 2321   PROTEINUR 30 (A) 06/19/2018 2321   UROBILINOGEN 0.2 07/19/2014 1923   NITRITE NEGATIVE 06/19/2018 2321   LEUKOCYTESUR  NEGATIVE 06/19/2018 2321   Sepsis Labs: @LABRCNTIP (procalcitonin:4,lacticidven:4) ) Recent Results (from the past 240 hour(s))  SARS Coronavirus 2 (CEPHEID- Performed in [place] hospital lab), Hosp Order     Status: None   Collection Time: 01/11/19  8:06 PM  Result Value Ref Range Status   SARS Coronavirus 2 NEGATIVE NEGATIVE Final    Comment: (NOTE) If result is NEGATIVE SARS-CoV-2 target nucleic acids are NOT DETECTED. The SARS-CoV-2 RNA is generally detectable in upper and lower  respiratory specimens during the acute phase of infection. The lowest  concentration of SARS-CoV-2 viral copies this assay can detect is 250  copies / mL. A negative result does not preclude SARS-CoV-2 infection  and should not be used as the sole basis for treatment or other  patient management decisions.  A negative result may occur with  improper specimen collection / handling, submission of specimen other  than nasopharyngeal swab, presence of viral mutation(s) within the  areas targeted by this assay, and inadequate number of viral copies  (<250 copies / mL). A negative result must be combined with clinical  observations, patient history, and epidemiological information. If result is POSITIVE SARS-CoV-2 target nucleic acids are DETECTED. The SARS-CoV-2 RNA is generally detectable in upper and lower  respiratory specimens dur ing the acute phase of infection.  Positive  results are indicative of active infection with SARS-CoV-2.  Clinical  correlation with patient history and other diagnostic information is  necessary to determine patient infection status.  Positive results do  not rule out bacterial infection or co-infection with other viruses. If result is PRESUMPTIVE POSTIVE SARS-CoV-2 nucleic acids MAY BE PRESENT.   A presumptive positive result was obtained on the submitted specimen  and confirmed on repeat testing.  While 2019 novel coronavirus  (SARS-CoV-2) nucleic acids may be present  in the submitted sample  additional confirmatory testing may be necessary for epidemiological  and / or clinical management purposes  to differentiate between  SARS-CoV-2 and other Sarbecovirus currently known to infect humans.  If clinically indicated additional testing with an alternate test  methodology [phone]) is advised. The SARS-CoV-2 RNA is generally  detectable in upper and lower respiratory sp ecimens during the acute  phase of infection. The expected result is Negative. Fact Sheet for Patients:  StrictlyIdeas.no Fact Sheet for Healthcare Providers: BankingDealers.co.za This test is not yet approved or cleared by the [place] FDA and has been authorized for detection and/or diagnosis of SARS-CoV-2 by FDA under an Emergency Use Authorization (EUA).  This EUA will remain in effect (meaning this test can be used) for the duration of the COVID-19 declaration under Section 564(b)(1) of the Act, 21 U.S.C. section 360bbb-3(b)(1), unless the authorization is terminated or revoked sooner. Performed at [facility], [place] [address], [place], [place] [address]      Radiological Exams on Admission: Dg Chest Port 1 View  Result Date: 01/11/2019 CLINICAL DATA:  Fever. EXAM: PORTABLE CHEST 1 VIEW COMPARISON:  09/29/2018. FINDINGS: Normal sized heart. Clear lungs. Normal appearing bones. IMPRESSION: Normal examination. Electronically Signed   By: Claudie Revering M.D.   On: 01/11/2019 20:47    EKG: Independently reviewed.  Sinus tachycardia nonspecific ST-T changes.  Assessment/Plan Principal  Problem:   SIRS (systemic inflammatory response syndrome) (HCC) Active Problems:   Fibromyalgia   Iron deficiency anemia   Nausea vomiting and diarrhea   Uncontrolled type 2 diabetes mellitus with hyperglycemia ([place])    1. SIRS most likely secondary to gastroenteritis -follow blood cultures lactate levels procalcitonin.  Will narrow down  antibiotics if procalcitonin is negative.  UA is pending.  Continue with hydration.  Will advance diet if tolerated.  Follow GI pathogen panel. 2. Diabetes mellitus type 2 uncontrolled with early DKA picture -I have ordered a repeat metabolic panel and if it shows worsening anion gap will start IV insulin otherwise every 4 CBG checks with sliding scale coverage and if tolerated diet start home dose of Basaglar insulin. 3. History of palpitation on metoprolol.  Blood pressures in the low normal.  By time I examined patient's heart rate is improved at this time in the 90s.  Blood pressure allows restart metoprolol. 4. 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 5. History of mood disorder on Lamictal which will be continued if tolerated. 6. Mildly elevated ALT.  Repeat LFTs with next blood draw.   DVT prophylaxis: Lovenox. Code Status: Full code. Family Communication: Discussed with patient. Disposition Plan: Home. Consults called: None. Admission status: Observation.   Rise Patience MD [facility] Pager [phone].  If 7PM-7AM, please contact night-coverage [email or web address] Password [facility]  01/11/2019, 11:29 PM

## 2019-01-11 NOTE — Telephone Encounter (Signed)
Spoke to patient:  Sx started last Saturday with BS in the 300's  Patient has N/V/D  Patient states she vomits several times a day and has not been able to hold down even water. Patient has not taken her temp but states she has gotten cold sweats.   Patient states mid upper GI pain that radiates to her back.  I asked if she has contacted her PCP because she will most likely need evaluated and perhaps fluids, she has been on hold with their office and not gotten through.  Please advise.

## 2019-01-11 NOTE — Telephone Encounter (Signed)
Definitely get in touch with PCP to see what could have happened.  She may need to go to the hospital if the symptoms are severe.  I cannot really advise her about her diabetes without talking to her.  I have not seen her in 8 months... Will need eventual appointment, but this is not priority now if her symptoms are severe.

## 2019-01-11 NOTE — ED Triage Notes (Addendum)
Pt endorses upper abd pain with n/v/d since Saturday with cold sweats. Afebrile here. Also endorses shob. Tachy in the 150s. Axox4. Not keeping anything down . Pt is type 2 DM.

## 2019-01-11 NOTE — Progress Notes (Signed)
Pharmacy Antibiotic Note  Lisa Duran is a 38 y.o. female admitted on 01/11/2019 with sepsis.  Pharmacy has been consulted for vancomycin and cefepime dosing.  Plan: Vancomycin 2000mg  x1 Vancomycin 1500mg  q24h Estimated AUC = 506 Cefepime 2g q8h Monitor renal function, culture data, LOT, vanc levels PRN  Height: 5\' 4"  (162.6 cm) Weight: 217 lb (98.4 kg) IBW/kg (Calculated) : 54.7  Temp (24hrs), Avg:98.3 F (36.8 C), Min:98.1 F (36.7 C), Max:98.4 F (36.9 C)  Recent Labs  Lab 01/11/19 1651  WBC 7.8  CREATININE 0.96  LATICACIDVEN 2.5*    Estimated Creatinine Clearance: 88.8 mL/min (by C-G formula based on SCr of 0.96 mg/dL).    Allergies  Allergen Reactions  . Propofol Shortness Of Breath    SOB, chest tightness, wheezing after colonoscopy on 09-18-15  . Chicken Allergy   . Gluten Meal Diarrhea  . Other     Any Nuts   . Victoza [Liraglutide] Rash   Thank you for involving pharmacy in this patient's care.  Janae Bridgeman, PharmD PGY1 Pharmacy Resident Phone: [phone] 01/11/2019 8:43 PM

## 2019-01-11 NOTE — Telephone Encounter (Signed)
Incoming call from  Pt who complains of  Nausea and vomiting  And diarrhea. Elevated blood sugar, in the 300's.  Patient cant keep any food of liquids down.   Beginning to show signs of dehydration.  Onset was Sat.  Patient states that she vomited 20 times yesterday. Reports abdominal Pain. Feels "Shaky".  Protoco suggest Pt.  Goes to ED or urgent care.  Pt. Voices understanding . Reason for Disposition . [1] Drinking very little AND [2] dehydration suspected (e.g., no urine > 12 hours, very dry mouth, very lightheaded)  Answer Assessment - Initial Assessment Questions 1. DIARRHEA SEVERITY: "How bad is the diarrhea?" "How many extra stools have you had in the past 24 hours than normal?"    - NO DIARRHEA (SCALE 0)   - MILD (SCALE 1-3): Few loose or mushy BMs; increase of 1-3 stools over normal daily number of stools; mild increase in ostomy output.   -  MODERATE (SCALE 4-7): Increase of 4-6 stools daily over normal; moderate increase in ostomy output. * SEVERE (SCALE 8-10; OR 'WORST POSSIBLE'): Increase of 7 or more stools daily over normal; moderate increase in ostomy output; incontinence.    Was severe 2. ONSET: "When did the diarrhea begin?"     Sat 3. BM CONSISTENCY: "How loose or watery is the diarrhea?"      watery 4. VOMITING: "Are you also vomiting?" If so, ask: "How many times in the past 24 hours?"      20 times 5. ABDOMINAL PAIN: "Are you having any abdominal pain?" If yes: "What does it feel like?" (e.g., crampy, dull, intermittent, constant)      Yes upper stomach 6. ABDOMINAL PAIN SEVERITY: If present, ask: "How bad is the pain?"  (e.g., Scale 1-10; mild, moderate, or severe)   - MILD (1-3): doesn't interfere with normal activities, abdomen soft and not tender to touch    - MODERATE (4-7): interferes with normal activities or awakens from sleep, tender to touch    - SEVERE (8-10): excruciating pain, doubled over, unable to do any normal activities       severe 7. ORAL INTAKE: If  vomiting, "Have you been able to drink liquids?" "How much fluids have you had in the past 24 hours?"      8. HYDRATION: "Any signs of dehydration?" (e.g., dry mouth [not just dry lips], too weak to stand, dizziness, new weight loss) "When did you last urinate?"dry lips ,  mouth    Shaky"    9. EXPOSURE: "Have you traveled to a foreign country recently?" "Have you been exposed to anyone with diarrhea?" "Could you have eaten any food that was spoiled?"     *No Answer* 10. ANTIBIOTIC USE: "Are you taking antibiotics now or have you taken antibiotics in the past 2 months?"      Denies  11. OTHER SYMPTOMS: "Do you have any other symptoms?" (e.g., fever, blood in stool)       Cold sweats  12. PREGNANCY: "Is there any chance you are pregnant?" "When was your last menstrual period?"       Iud . Not sexually active  Protocols used: DIARRHEA-A-AH

## 2019-01-12 DIAGNOSIS — J309 Allergic rhinitis, unspecified: Secondary | ICD-10-CM | POA: Diagnosis present

## 2019-01-12 DIAGNOSIS — Z6837 Body mass index (BMI) 37.0-37.9, adult: Secondary | ICD-10-CM | POA: Diagnosis not present

## 2019-01-12 DIAGNOSIS — E1110 Type 2 diabetes mellitus with ketoacidosis without coma: Secondary | ICD-10-CM | POA: Diagnosis present

## 2019-01-12 DIAGNOSIS — E1165 Type 2 diabetes mellitus with hyperglycemia: Secondary | ICD-10-CM | POA: Diagnosis present

## 2019-01-12 DIAGNOSIS — J302 Other seasonal allergic rhinitis: Secondary | ICD-10-CM | POA: Diagnosis present

## 2019-01-12 DIAGNOSIS — Z20828 Contact with and (suspected) exposure to other viral communicable diseases: Secondary | ICD-10-CM | POA: Diagnosis present

## 2019-01-12 DIAGNOSIS — M797 Fibromyalgia: Secondary | ICD-10-CM | POA: Diagnosis present

## 2019-01-12 DIAGNOSIS — E669 Obesity, unspecified: Secondary | ICD-10-CM | POA: Diagnosis present

## 2019-01-12 DIAGNOSIS — Z975 Presence of (intrauterine) contraceptive device: Secondary | ICD-10-CM | POA: Diagnosis not present

## 2019-01-12 DIAGNOSIS — N390 Urinary tract infection, site not specified: Secondary | ICD-10-CM | POA: Diagnosis not present

## 2019-01-12 DIAGNOSIS — F909 Attention-deficit hyperactivity disorder, unspecified type: Secondary | ICD-10-CM | POA: Diagnosis present

## 2019-01-12 DIAGNOSIS — Z888 Allergy status to other drugs, medicaments and biological substances status: Secondary | ICD-10-CM | POA: Diagnosis not present

## 2019-01-12 DIAGNOSIS — A419 Sepsis, unspecified organism: Secondary | ICD-10-CM | POA: Diagnosis present

## 2019-01-12 DIAGNOSIS — F419 Anxiety disorder, unspecified: Secondary | ICD-10-CM | POA: Diagnosis present

## 2019-01-12 DIAGNOSIS — K529 Noninfective gastroenteritis and colitis, unspecified: Secondary | ICD-10-CM | POA: Diagnosis present

## 2019-01-12 DIAGNOSIS — Z91018 Allergy to other foods: Secondary | ICD-10-CM | POA: Diagnosis not present

## 2019-01-12 DIAGNOSIS — Z87891 Personal history of nicotine dependence: Secondary | ICD-10-CM | POA: Diagnosis not present

## 2019-01-12 DIAGNOSIS — Z91010 Allergy to peanuts: Secondary | ICD-10-CM | POA: Diagnosis not present

## 2019-01-12 DIAGNOSIS — K219 Gastro-esophageal reflux disease without esophagitis: Secondary | ICD-10-CM | POA: Diagnosis present

## 2019-01-12 LAB — GLUCOSE, CAPILLARY
Glucose-Capillary: 119 mg/dL — ABNORMAL HIGH (ref 70–99)
Glucose-Capillary: 146 mg/dL — ABNORMAL HIGH (ref 70–99)
Glucose-Capillary: 111 mg/dL — ABNORMAL HIGH (ref 70–99)
Glucose-Capillary: 194 mg/dL — ABNORMAL HIGH (ref 70–99)

## 2019-01-12 LAB — URINALYSIS, ROUTINE W REFLEX MICROSCOPIC
Bilirubin Urine: NEGATIVE
Glucose, UA: 50 mg/dL — AB
Ketones, ur: NEGATIVE mg/dL
Nitrite: NEGATIVE
Protein, ur: NEGATIVE mg/dL
Specific Gravity, Urine: >1.046 — ABNORMAL HIGH (ref 1.005–1.030)
pH: 5 (ref 5.0–8.0)

## 2019-01-12 LAB — BASIC METABOLIC PANEL
Anion gap: 12 (ref 5–15)
Anion gap: 9 (ref 5–15)
BUN: 11 mg/dL (ref 6–20)
BUN: 15 mg/dL (ref 6–20)
CO2: 19 mmol/L — ABNORMAL LOW (ref 22–32)
CO2: 20 mmol/L — ABNORMAL LOW (ref 22–32)
Calcium: 8 mg/dL — ABNORMAL LOW (ref 8.9–10.3)
Calcium: 8.2 mg/dL — ABNORMAL LOW (ref 8.9–10.3)
Chloride: 106 mmol/L (ref 98–111)
Chloride: 110 mmol/L (ref 98–111)
Creatinine, Ser: 0.85 mg/dL (ref 0.44–1.00)
Creatinine, Ser: 1.06 mg/dL — ABNORMAL HIGH (ref 0.44–1.00)
GFR calc Af Amer: >60 mL/min (ref >60)
GFR calc Af Amer: >60 mL/min (ref >60)
GFR calc non Af Amer: >60 mL/min (ref >60)
GFR calc non Af Amer: >60 mL/min (ref >60)
Glucose, Bld: 108 mg/dL — ABNORMAL HIGH (ref 70–99)
Glucose, Bld: 76 mg/dL (ref 70–99)
Potassium: 3.4 mmol/L — ABNORMAL LOW (ref 3.5–5.1)
Potassium: 3.4 mmol/L — ABNORMAL LOW (ref 3.5–5.1)
Sodium: 137 mmol/L (ref 135–145)
Sodium: 139 mmol/L (ref 135–145)

## 2019-01-12 LAB — CBG MONITORING, ED
Glucose-Capillary: 86 mg/dL (ref 70–99)
Glucose-Capillary: 112 mg/dL — ABNORMAL HIGH (ref 70–99)

## 2019-01-12 LAB — LACTIC ACID, PLASMA
Lactic Acid, Venous: 1.1 mmol/L (ref 0.5–1.9)
Lactic Acid, Venous: 2.8 mmol/L (ref 0.5–1.9)

## 2019-01-12 LAB — PROCALCITONIN: Procalcitonin: <0.1 ng/mL

## 2019-01-12 LAB — HIV ANTIBODY (ROUTINE TESTING W REFLEX): HIV Screen 4th Generation wRfx: NONREACTIVE

## 2019-01-12 LAB — TROPONIN I: Troponin I: <0.03 ng/mL (ref <0.03)

## 2019-01-12 MED ORDER — SODIUM CHLORIDE 0.9 % IV SOLN
1.0000 g | INTRAVENOUS | Status: DC
  Administered 2019-01-12 – 2019-01-13 (×2): 1 g via INTRAVENOUS
  Filled 2019-01-12 (×2): qty 10

## 2019-01-12 MED ORDER — DIPHENHYDRAMINE HCL 50 MG/ML IJ SOLN
25.0000 mg | Freq: Once | INTRAMUSCULAR | Status: AC
  Administered 2019-01-12: 25 mg via INTRAVENOUS
  Filled 2019-01-12: qty 1

## 2019-01-12 MED ORDER — INSULIN GLARGINE 100 UNIT/ML ~~LOC~~ SOLN
30.0000 [IU] | Freq: Every day | SUBCUTANEOUS | Status: DC
  Administered 2019-01-12: 30 [IU] via SUBCUTANEOUS
  Filled 2019-01-12 (×2): qty 0.3

## 2019-01-12 MED ORDER — SODIUM CHLORIDE 0.9 % IV BOLUS
500.0000 mL | Freq: Once | INTRAVENOUS | Status: AC
  Administered 2019-01-12: 06:00:00 500 mL via INTRAVENOUS

## 2019-01-12 MED ORDER — KETOROLAC TROMETHAMINE 30 MG/ML IJ SOLN
30.0000 mg | Freq: Once | INTRAMUSCULAR | Status: AC
  Administered 2019-01-12: 30 mg via INTRAVENOUS
  Filled 2019-01-12: qty 1

## 2019-01-12 MED ORDER — METOCLOPRAMIDE HCL 5 MG/ML IJ SOLN
10.0000 mg | Freq: Once | INTRAMUSCULAR | Status: AC
  Administered 2019-01-12: 10 mg via INTRAVENOUS
  Filled 2019-01-12: qty 2

## 2019-01-12 MED ORDER — ENOXAPARIN SODIUM 60 MG/0.6ML ~~LOC~~ SOLN
50.0000 mg | Freq: Every day | SUBCUTANEOUS | Status: DC
  Administered 2019-01-12: 50 mg via SUBCUTANEOUS
  Filled 2019-01-12: qty 0.6
  Filled 2019-01-12: qty 0.5
  Filled 2019-01-12: qty 0.6

## 2019-01-12 NOTE — Progress Notes (Signed)
Patient C/O headache similar to her Migraine pain, will alert MD to see what he wants to do for her. She's tolerating it for now but text paged MD for further orders.

## 2019-01-12 NOTE — Progress Notes (Signed)
Progress Note    Lisa Duran BSW:[id number] DOB: 05-25-1981 DOA: 01/11/2019  PCP: Binnie Rail, MD  Patient coming from: Home.  Chief Complaint: Nausea vomiting diarrhea elevated blood sugar.  HPI: Lisa Duran is a 38 y.o. female with history of diabetes mellitus type 2, migraine, fibromyalgia, depression, ADHD has been experiencing nausea vomiting diarrhea with epigastric pain for the last 4 days.  Denies any blood in the vomitus or diarrhea.  Subjective feeling of fever chills.  Denies chest pain or shortness of breath.  Has been taking her insulin despite patient has been not eating well but unable to keep anything.  Had gone to urgent care center and was found to have elevated blood sugar and concern for pain was referred to the ER for possible DKA. In the ED patient was tachycardic with a heart rate in the 130s to 140s with complete metabolic panel showing sodium of 132 bicarb 18 blood glucose 303 anion gap 16 CBC showing WBC of 7.8 hemoglobin 14.3 platelets 444 lactate was 2.5.  Chest x-ray unremarkable UA pending.  CT abdomen pelvis done shows features concerning for enteritis.  Given the patient's tachycardia and elevated lactate symptoms are concerning for developing sepsis patient was placed on antibiotics.  Fluid bolus was given for sepsis protocol.  Review of Systems: As per HPI, rest all negative.   Past Medical History:  Diagnosis Date   ADHD (attention deficit hyperactivity disorder)    ALLERGIC RHINITIS    Allergy    Anemia    ANXIETY    Depression    DIABETES MELLITUS, TYPE II    Fibromyalgia 2018   GERD (gastroesophageal reflux disease)    MIGRAINE, COMMON    Obesity, unspecified    OVARIAN CYST     Past Surgical History:  Procedure Laterality Date   NO PAST SURGERIES       reports that she quit smoking about 16 years ago. She has never used smokeless tobacco. She reports current alcohol use. She reports that she does not use drugs.  Allergies    Allergen Reactions   Chicken Allergy Anaphylaxis and Shortness Of Breath   Other Itching, Swelling and Other (See Comments)    NUTS (of ANY kind): Lips and mouth swell, but no breathing impairment & migraines and eczema are triggered   Propofol Shortness Of Breath    SOB, chest tightness, wheezing after colonoscopy on 09-18-15   Adhesive [Tape] Other (See Comments)    Makes the skin VERY RED    Gluten Meal Diarrhea   Peanut-Containing Drug Products Itching, Swelling and Other (See Comments)    Lips and mouth swell, but no breathing impairment & migraines and eczema are triggered    Victoza [Liraglutide] Rash    Family History  Problem Relation Age of Onset   Diabetes Mother    Hyperlipidemia Mother    Diabetes Father    Hyperlipidemia Father    Coronary artery disease Father    Hypertension Father    Cancer Father        esoph   Stroke Father    Cancer Maternal Grandfather 25       Prostate and Lung   Diabetes Sister        Gestational   Graves' disease Maternal Aunt    Cancer Maternal Uncle        Brain   Stroke Paternal Grandmother    Heart disease Paternal Grandmother    Stroke Paternal Grandfather    Heart disease Paternal Grandfather  Diabetes Sister        Gestational and Type II   Hashimoto's thyroiditis Cousin    Cancer Maternal Uncle        Brain    Prior to Admission medications   Medication Sig Start Date End Date Taking? Authorizing Provider  almotriptan (AXERT) 12.5 MG tablet Take 1 tablet (12.5 mg total) by mouth as needed for migraine. may repeat in 2 hours if needed 10/20/18  Yes Burns, Claudina Lick, MD  ALPRAZolam (XANAX) 0.5 MG tablet TAKE 1 TABLET(0.5 MG) BY MOUTH THREE TIMES DAILY AS NEEDED FOR ANXIETY Patient taking differently: Take 0.5 mg by mouth 3 (three) times daily as needed for anxiety.  10/05/18  Yes Burns, Claudina Lick, MD  amphetamine-dextroamphetamine (ADDERALL XR) 20 MG 24 hr capsule Take 1 capsule (20 mg total) by  mouth daily. Patient taking differently: Take 20 mg by mouth See admin instructions. Take 20 mg by mouth once a day only on school or work days 12/29/18  Yes Burns, Claudina Lick, MD  DULoxetine (CYMBALTA) 60 MG capsule Take 1 capsule (60 mg total) by mouth daily. 10/20/18  Yes Burns, Claudina Lick, MD  fluconazole (DIFLUCAN) 150 MG tablet Take 1 tablet (150 mg total) by mouth daily for 3 days. 01/11/19 01/14/19 Yes Burns, Claudina Lick, MD  fluticasone (FLONASE) 50 MCG/ACT nasal spray Place 2 sprays into both nostrils daily. 10/20/18  Yes Burns, Claudina Lick, MD  gabapentin (NEURONTIN) 100 MG capsule Take 2 capsules (200 mg total) by mouth at bedtime. 10/20/18  Yes Burns, Claudina Lick, MD  glipiZIDE (GLUCOTROL XL) 5 MG 24 hr tablet Take 1 tablet (5 mg total) by mouth daily with breakfast. 01/22/18  Yes Philemon Kingdom, MD  Insulin Glargine (BASAGLAR KWIKPEN) 100 UNIT/ML SOPN Use to inject 32 units at bedtime 01/05/19  Yes Philemon Kingdom, MD  lamoTRIgine (LAMICTAL) 100 MG tablet TAKE 1 TABLET BY MOUTH TWO  TIMES DAILY Patient taking differently: Take 100 mg by mouth daily.  08/05/16  Yes Burns, Claudina Lick, MD  levonorgestrel (MIRENA) 20 MCG/24HR IUD 1 Intra Uterine Device (1 each total) by Intrauterine route once. 10/03/13  Yes Rowe Clack, MD  metFORMIN (GLUCOPHAGE) 1000 MG tablet TAKE 1 TABLET BY  MOUTH TWICE A DAY WITH A  MEAL Patient taking differently: Take 1,000 mg by mouth 2 (two) times daily with a meal.  01/22/18  Yes Philemon Kingdom, MD  metoprolol succinate (TOPROL-XL) 25 MG 24 hr tablet Take 0.5 tablets (12.5 mg total) by mouth daily. 10/20/18  Yes Burns, Claudina Lick, MD  omeprazole (PRILOSEC) 40 MG capsule TAKE 1 CAPSULE BY MOUTH  DAILY BEFORE SUPPER Patient taking differently: Take 40 mg by mouth daily before supper.  10/20/18  Yes Burns, Claudina Lick, MD  ondansetron (ZOFRAN-ODT) 4 MG disintegrating tablet Take 4 mg by mouth every 8 (eight) hours as needed for nausea or vomiting.   Yes [provider]  Prenatal  Vit-Fe Fumarate-FA (MULTIVITAMIN-PRENATAL) 27-0.8 MG TABS tablet Take 1 tablet by mouth every other day.    Yes [provider]  sitaGLIPtin (JANUVIA) 100 MG tablet Take 1 tablet (100 mg total) by mouth daily. 11/03/18  Yes Philemon Kingdom, MD  triamcinolone cream (KENALOG) 0.1 % Apply topically 2 (two) times daily as needed. Patient taking differently: Apply 1 application topically 2 (two) times daily as needed (for itching).  06/20/14  Yes Rowe Clack, MD  glucose blood (ONETOUCH VERIO) test strip Use 2x a day 01/22/18   Philemon Kingdom, MD  Insulin  Pen Needle (BD PEN NEEDLE NANO U/F) 32G X 4 MM MISC USE ONCE A DAY 01/04/19   Philemon Kingdom, MD  iron polysaccharides (NIFEREX) 150 MG capsule Take 1 capsule (150 mg total) by mouth daily. Patient not taking: Reported on 01/11/2019 05/27/17   Brunetta Genera, MD    Physical Exam: Vitals:   01/12/19 0745 01/12/19 0842 01/12/19 1157 01/12/19 1616  BP: 102/68 113/74 110/79 110/78  Pulse: 86 91 84 84  Resp: (!) 21  16 16   Temp:  98.4 F (36.9 C) 98.5 F (36.9 C) 97.8 F (36.6 C)  TempSrc:  Axillary Oral Oral  SpO2: 97% 98% 99% 98%  Weight:  102.2 kg    Height:  5\' 4"  (1.626 m)        Constitutional: Moderately built and nourished. Vitals:   01/12/19 0745 01/12/19 0842 01/12/19 1157 01/12/19 1616  BP: 102/68 113/74 110/79 110/78  Pulse: 86 91 84 84  Resp: (!) 21  16 16   Temp:  98.4 F (36.9 C) 98.5 F (36.9 C) 97.8 F (36.6 C)  TempSrc:  Axillary Oral Oral  SpO2: 97% 98% 99% 98%  Weight:  102.2 kg    Height:  5\' 4"  (1.626 m)     Eyes: Anicteric no pallor. ENMT: No discharge from the ears eyes nose or mouth. Neck: No mass felt.  No neck rigidity. Respiratory: No rhonchi or crepitations. Cardiovascular: S1-S2 heard. Abdomen: Soft mild epigastric tenderness no guarding or rigidity. Musculoskeletal: No edema. Skin: No rash. Neurologic: Alert awake oriented to time place and person.  Moves all  extremities. Psychiatric: Appears normal.  Normal affect.   Labs on Admission: I have personally reviewed following labs and imaging studies  CBC: Recent Labs  Lab 01/11/19 1651 01/11/19 2225  WBC 7.8  --   NEUTROABS 4.4  --   HGB 14.3 12.6  HCT 45.9 37.0  MCV 77.9*  --   PLT 444*  --    Basic Metabolic Panel: Recent Labs  Lab 01/11/19 1651 01/11/19 2225 01/11/19 2338 01/12/19 0550  NA 132* 137 137 139  K 4.0 3.4* 3.4* 3.4*  CL 98  --  106 110  CO2 18*  --  19* 20*  GLUCOSE 303*  --  76 108*  BUN 13  --  15 11  CREATININE 0.96  --  1.06* 0.85  CALCIUM 9.6  --  8.2* 8.0*   GFR: Estimated Creatinine Clearance: 102.4 mL/min (by C-G formula based on SCr of 0.85 mg/dL). Liver Function Tests: Recent Labs  Lab 01/11/19 1651  AST 33  ALT 67*  ALKPHOS 138*  BILITOT 0.7  PROT 8.0  ALBUMIN 4.0   Recent Labs  Lab 01/11/19 2055  LIPASE 27   No results for input(s): AMMONIA in the last 168 hours. Coagulation Profile: Recent Labs  Lab 01/11/19 1651  INR 1.1   Cardiac Enzymes: Recent Labs  Lab 01/12/19 0550  TROPONINI <0.03   BNP (last 3 results) No results for input(s): PROBNP in the last 8760 hours. HbA1C: No results for input(s): HGBA1C in the last 72 hours. CBG: Recent Labs  Lab 01/12/19 0337 01/12/19 0637 01/12/19 0837 01/12/19 1110 01/12/19 1610  GLUCAP 86 112* 119* 146* 111*   Lipid Profile: No results for input(s): CHOL, HDL, LDLCALC, TRIG, CHOLHDL, LDLDIRECT in the last 72 hours. Thyroid Function Tests: No results for input(s): TSH, T4TOTAL, FREET4, T3FREE, THYROIDAB in the last 72 hours. Anemia Panel: No results for input(s): VITAMINB12, FOLATE, FERRITIN, TIBC, IRON, RETICCTPCT  in the last 72 hours. Urine analysis:    Component Value Date/Time   COLORURINE YELLOW 01/12/2019 0630   APPEARANCEUR CLOUDY (A) 01/12/2019 0630   LABSPEC >1.046 (H) 01/12/2019 0630   PHURINE 5.0 01/12/2019 0630   GLUCOSEU 50 (A) 01/12/2019 0630   GLUCOSEU  NEGATIVE 01/26/2012 0859   HGBUR SMALL (A) 01/12/2019 0630   BILIRUBINUR NEGATIVE 01/12/2019 0630   KETONESUR NEGATIVE 01/12/2019 0630   PROTEINUR NEGATIVE 01/12/2019 0630   UROBILINOGEN 0.2 07/19/2014 1923   NITRITE NEGATIVE 01/12/2019 0630   LEUKOCYTESUR MODERATE (A) 01/12/2019 0630   Sepsis Labs: @LABRCNTIP (procalcitonin:4,lacticidven:4) ) Recent Results (from the past 240 hour(s))  SARS Coronavirus 2 (CEPHEID- Performed in [facility] hospital lab), Hosp Order     Status: None   Collection Time: 01/11/19  8:06 PM  Result Value Ref Range Status   SARS Coronavirus 2 NEGATIVE NEGATIVE Final    Comment: (NOTE) If result is NEGATIVE SARS-CoV-2 target nucleic acids are NOT DETECTED. The SARS-CoV-2 RNA is generally detectable in upper and lower  respiratory specimens during the acute phase of infection. The lowest  concentration of SARS-CoV-2 viral copies this assay can detect is 250  copies / mL. A negative result does not preclude SARS-CoV-2 infection  and should not be used as the sole basis for treatment or other  patient management decisions.  A negative result may occur with  improper specimen collection / handling, submission of specimen other  than nasopharyngeal swab, presence of viral mutation(s) within the  areas targeted by this assay, and inadequate number of viral copies  (<250 copies / mL). A negative result must be combined with clinical  observations, patient history, and epidemiological information. If result is POSITIVE SARS-CoV-2 target nucleic acids are DETECTED. The SARS-CoV-2 RNA is generally detectable in upper and lower  respiratory specimens dur ing the acute phase of infection.  Positive  results are indicative of active infection with SARS-CoV-2.  Clinical  correlation with patient history and other diagnostic information is  necessary to determine patient infection status.  Positive results do  not rule out bacterial infection or co-infection with  other viruses. If result is PRESUMPTIVE POSTIVE SARS-CoV-2 nucleic acids MAY BE PRESENT.   A presumptive positive result was obtained on the submitted specimen  and confirmed on repeat testing.  While 2019 novel coronavirus  (SARS-CoV-2) nucleic acids may be present in the submitted sample  additional confirmatory testing may be necessary for epidemiological  and / or clinical management purposes  to differentiate between  SARS-CoV-2 and other Sarbecovirus currently known to infect humans.  If clinically indicated additional testing with an alternate test  methodology [phone]) is advised. The SARS-CoV-2 RNA is generally  detectable in upper and lower respiratory sp ecimens during the acute  phase of infection. The expected result is Negative. Fact Sheet for Patients:  StrictlyIdeas.no Fact Sheet for Healthcare Providers: BankingDealers.co.za This test is not yet approved or cleared by the [place] FDA and has been authorized for detection and/or diagnosis of SARS-CoV-2 by FDA under an Emergency Use Authorization (EUA).  This EUA will remain in effect (meaning this test can be used) for the duration of the COVID-19 declaration under Section 564(b)(1) of the Act, 21 U.S.C. section 360bbb-3(b)(1), unless the authorization is terminated or revoked sooner. Performed at [facility], [place] [address] [place], [place] [address]   Culture, blood (Routine x 2)     Status: None (Preliminary result)   Collection Time: 01/11/19  8:44 PM  Result  Value Ref Range Status   Specimen Description BLOOD RIGHT ARM  Final   Special Requests   Final    BOTTLES DRAWN AEROBIC AND ANAEROBIC Blood Culture adequate volume   Culture   Final    NO GROWTH < 12 HOURS Performed at [facility], [address], [place], [place] [address]    Report Status PENDING  Incomplete     Radiological Exams on Admission: Ct Abdomen Pelvis W  Contrast  Result Date: 01/12/2019 CLINICAL DATA:  38 year old female with abdominal pain. EXAM: CT ABDOMEN AND PELVIS WITH CONTRAST TECHNIQUE: Multidetector CT imaging of the abdomen and pelvis was performed using the standard protocol following bolus administration of intravenous contrast. CONTRAST:  136mL OMNIPAQUE IOHEXOL 300 MG/ML  SOLN COMPARISON:  Abdominal CT dated 06/19/2018 FINDINGS: Lower chest: The visualized lungs are clear. There is no cardiomegaly or pericardial effusion. No intra-abdominal free air or free fluid. Hepatobiliary: Diffuse fatty infiltration of the liver. No intrahepatic biliary ductal dilatation. The gallbladder is unremarkable. Pancreas: Unremarkable. No pancreatic ductal dilatation or surrounding inflammatory changes. Spleen: Normal in size without focal abnormality. Adrenals/Urinary Tract: Adrenal glands are unremarkable. Kidneys are normal, without renal calculi, focal lesion, or hydronephrosis. Bladder is unremarkable. Stomach/Bowel: There is a small hiatal hernia. There is no bowel obstruction. Mildly thickened jejunal loops may be related to underdistention although mild enteritis is not entirely excluded. Clinical correlation is recommended. Loose stool noted within the colon and rectum compatible with diarrheal state. Clinical correlation is recommended. The appendix is normal. Vascular/Lymphatic: The abdominal aorta and IVC appear unremarkable. No portal venous gas. There is no adenopathy. There is minimal haziness of the upper mesentery with multiple small lymph nodes with a "misty mesentery" appearance. This finding is nonspecific but may be related to underlying inflammatory/infectious etiology. Reproductive: The uterus is anteverted. An intrauterine device is noted. The ovaries are unremarkable. Other: None Musculoskeletal: Bilateral L5 pars defects. No listhesis. No acute osseous pathology. IMPRESSION: 1. Diarrheal state with findings of possible mild enteritis. Clinical  correlation is recommended. No bowel obstruction. Normal appendix. 2. Fatty liver. Electronically Signed   By: Anner Crete M.D.   On: 01/12/2019 00:36   Dg Chest Port 1 View  Result Date: 01/11/2019 CLINICAL DATA:  Fever. EXAM: PORTABLE CHEST 1 VIEW COMPARISON:  09/29/2018. FINDINGS: Normal sized heart. Clear lungs. Normal appearing bones. IMPRESSION: Normal examination. Electronically Signed   By: Claudie Revering M.D.   On: 01/11/2019 20:47    EKG: Independently reviewed.  Sinus tachycardia nonspecific ST-T changes.  Assessment/Plan Principal Problem:   Sepsis secondary to UTI [facility]) Active Problems:   Fibromyalgia   Iron deficiency anemia   SIRS (systemic inflammatory response syndrome) (HCC)   Nausea vomiting and diarrhea   Uncontrolled type 2 diabetes mellitus with hyperglycemia (HCC)   Enteritis    Sepsis, likely multifactorial in the setting of gastroenteritis as well as possible UTI, POA -Continue ceftriaxone, patient received multiple antibiotics previously including vancomycin, Flagyl, cefepime -Follow cultures -Patient admits to questionable dysuria as well as intractable nausea vomiting, questionable fevers chills over the last 24 hours  Acute intractable nausea vomiting abdominal pain secondary to above -Improving somewhat with IV fluids, Zofran, Phenergan -Still unable to tolerate p.o. adequately enough to maintain needs -Continue IV fluids over the next 24 hours, once patient able to adequately take p.o. we will discontinue IV fluids  Anion gap metabolic acidosis secondary to lactic acidosis in the setting of above  -Improving with IV fluids -Continue IV fluids until  patient's p.o. 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  History of mood disorder Continue on Lamictal  DVT prophylaxis: Lovenox. Code Status: Full code. Family Communication: Discussed with patient. Disposition Plan: Home. Consults called: None. Admission status: Inpatient, patient unable to tolerate p.o. safely or adequately enough to maintain appropriate volume status.  Patient able to take p.o. adequately would consider discharge likely next 24 to 48 hours, again pending improvement, tolerance of p.o. and resolution of intractable nausea vomiting and abdominal pain.  Little Ishikawa MD [facility] Pager [phone].  If 7PM-7AM, please contact night-coverage [email or web address] Password [facility]  01/12/2019, 4:33 PM

## 2019-01-12 NOTE — ED Notes (Signed)
ED TO INPATIENT HANDOFF REPORT  ED Nurse Name and Phone #: Tray Martinique, [phone]  S Name/Age/Gender Lisa Duran 38 y.o. female Room/Bed: 021C/021C  Code Status   Code Status: Full Code  Home/SNF/Other Home Patient oriented to: self, place, time and situation Is this baseline? Yes   Triage Complete: Triage complete  Chief Complaint N/V/D, [facility], Tachy  Triage Note Pt endorses upper abd pain with n/v/d since Saturday with cold sweats. Afebrile here. Also endorses shob. Tachy in the 150s. Axox4. Not keeping anything down . Pt is type 2 DM.    Allergies Allergies  Allergen Reactions  . Chicken Allergy Anaphylaxis and Shortness Of Breath  . Other Itching, Swelling and Other (See Comments)    NUTS (of ANY kind): Lips and mouth swell, but no breathing impairment & migraines and eczema are triggered  . Propofol Shortness Of Breath    SOB, chest tightness, wheezing after colonoscopy on 09-18-15  . Adhesive [Tape] Other (See Comments)    Makes the skin VERY RED   . Gluten Meal Diarrhea  . Peanut-Containing Drug Products Itching, Swelling and Other (See Comments)    Lips and mouth swell, but no breathing impairment & migraines and eczema are triggered   . Victoza [Liraglutide] Rash    Level of Care/Admitting Diagnosis ED Disposition    ED Disposition Condition Comment   Admit  Hospital Area: [place] [[id number]]  Level of Care: Progressive [102]  I expect the patient will be discharged within 24 hours: No (not a candidate for 5C-Observation unit)  Covid Evaluation: N/A  Diagnosis: SIRS (systemic inflammatory response syndrome) [facility]) [[id number]]  Admitting Physician: Rise Patience [phone]  Attending Physician: Rise Patience [email or web address]  PT Class (Do Not Modify): Observation [104]  PT Acc Code (Do Not Modify): Observation [10022]       B Medical/Surgery History Past Medical History:  Diagnosis Date  . ADHD (attention deficit hyperactivity disorder)    . ALLERGIC RHINITIS   . Allergy   . Anemia   . ANXIETY   . Depression   . DIABETES MELLITUS, TYPE II   . Fibromyalgia 2018  . GERD (gastroesophageal reflux disease)   . MIGRAINE, COMMON   . Obesity, unspecified   . OVARIAN CYST    Past Surgical History:  Procedure Laterality Date  . NO PAST SURGERIES       A IV Location/Drains/Wounds Patient Lines/Drains/Airways Status   Active Line/Drains/Airways    Name:   Placement date:   Placement time:   Site:   Days:   Peripheral IV 01/11/19 Right Antecubital   01/11/19    2043    Antecubital   1   Peripheral IV 01/11/19 Right;Anterior Forearm   01/11/19    2108    Forearm   1          Intake/Output Last 24 hours  Intake/Output Summary (Last 24 hours) at 01/12/2019 0644 Last data filed at 01/12/2019 4782 Gross per 24 hour  Intake 1898.08 ml  Output -  Net 1898.08 ml    Labs/Imaging Results for orders placed or performed during the hospital encounter of 01/11/19 (from the past 48 hour(s))  Comprehensive metabolic panel     Status: Abnormal   Collection Time: 01/11/19  4:51 PM  Result Value Ref Range   Sodium 132 (L) 135 - 145 mmol/L   Potassium 4.0 3.5 - 5.1 mmol/L   Chloride 98 98 - 111 mmol/L   CO2 18 (L) 22 - 32 mmol/L  Glucose, Bld 303 (H) 70 - 99 mg/dL   BUN 13 6 - 20 mg/dL   Creatinine, Ser 0.96 0.44 - 1.00 mg/dL   Calcium 9.6 8.9 - 10.3 mg/dL   Total Protein 8.0 6.5 - 8.1 g/dL   Albumin 4.0 3.5 - 5.0 g/dL   AST 33 15 - 41 U/L   ALT 67 (H) 0 - 44 U/L   Alkaline Phosphatase 138 (H) 38 - 126 U/L   Total Bilirubin 0.7 0.3 - 1.2 mg/dL   GFR calc non Af Amer >60 >60 mL/min   GFR calc Af Amer >60 >60 mL/min   Anion gap 16 (H) 5 - 15    Comment: Performed at [facility] [address], [place], [place] [address]  Lactic acid, plasma     Status: Abnormal   Collection Time: 01/11/19  4:51 PM  Result Value Ref Range   Lactic Acid, Venous 2.5 (HH) 0.5 - 1.9 mmol/L    Comment: CRITICAL RESULT CALLED TO, READ  BACK BY AND VERIFIED WITH: Dellia Cloud RN AT 5170 ON [id number] BY Marcos Eke Performed at [facility], [place] [address] [place], [place] [address]   CBC with Differential     Status: Abnormal   Collection Time: 01/11/19  4:51 PM  Result Value Ref Range   WBC 7.8 4.0 - 10.5 K/uL   RBC 5.89 (H) 3.87 - 5.11 MIL/uL   Hemoglobin 14.3 12.0 - 15.0 g/dL   HCT 45.9 36.0 - 46.0 %   MCV 77.9 (L) 80.0 - 100.0 fL   MCH 24.3 (L) 26.0 - 34.0 pg   MCHC 31.2 30.0 - 36.0 g/dL   RDW 15.9 (H) 11.5 - 15.5 %   Platelets 444 (H) 150 - 400 K/uL   nRBC 0.0 0.0 - 0.2 %   Neutrophils Relative % 57 %   Neutro Abs 4.4 1.7 - 7.7 K/uL   Lymphocytes Relative 27 %   Lymphs Abs 2.1 0.7 - 4.0 K/uL   Monocytes Relative 14 %   Monocytes Absolute 1.1 (H) 0.1 - 1.0 K/uL   Eosinophils Relative 2 %   Eosinophils Absolute 0.2 0.0 - 0.5 K/uL   Basophils Relative 0 %   Basophils Absolute 0.0 0.0 - 0.1 K/uL   Immature Granulocytes 0 %   Abs Immature Granulocytes 0.01 0.00 - 0.07 K/uL    Comment: Performed at [facility], [place] [address], [place], [place] [address]  Protime-INR     Status: None   Collection Time: 01/11/19  4:51 PM  Result Value Ref Range   Prothrombin Time 13.8 11.4 - 15.2 seconds   INR 1.1 0.8 - 1.2    Comment: (NOTE) INR goal varies based on device and disease states. Performed at [facility], [place] [address] [place], [place] [address]   I-Stat beta hCG blood, ED     Status: None   Collection Time: 01/11/19  5:09 PM  Result Value Ref Range   I-stat hCG, quantitative <5.0 <5 mIU/mL   Comment 3            Comment:   GEST. AGE      CONC.  (mIU/mL)   <=1 WEEK        5 - 50     2 WEEKS       50 - 500     3 WEEKS       100 - 10,000     4 WEEKS  1,000 - 30,000        FEMALE AND NON-PREGNANT FEMALE:     LESS THAN 5 mIU/mL   SARS Coronavirus 2 (CEPHEID- Performed in [place] hospital lab), Hosp Order     Status: None   Collection Time: 01/11/19  8:06 PM  Result Value Ref Range    SARS Coronavirus 2 NEGATIVE NEGATIVE    Comment: (NOTE) If result is NEGATIVE SARS-CoV-2 target nucleic acids are NOT DETECTED. The SARS-CoV-2 RNA is generally detectable in upper and lower  respiratory specimens during the acute phase of infection. The lowest  concentration of SARS-CoV-2 viral copies this assay can detect is 250  copies / mL. A negative result does not preclude SARS-CoV-2 infection  and should not be used as the sole basis for treatment or other  patient management decisions.  A negative result may occur with  improper specimen collection / handling, submission of specimen other  than nasopharyngeal swab, presence of viral mutation(s) within the  areas targeted by this assay, and inadequate number of viral copies  (<250 copies / mL). A negative result must be combined with clinical  observations, patient history, and epidemiological information. If result is POSITIVE SARS-CoV-2 target nucleic acids are DETECTED. The SARS-CoV-2 RNA is generally detectable in upper and lower  respiratory specimens dur ing the acute phase of infection.  Positive  results are indicative of active infection with SARS-CoV-2.  Clinical  correlation with patient history and other diagnostic information is  necessary to determine patient infection status.  Positive results do  not rule out bacterial infection or co-infection with other viruses. If result is PRESUMPTIVE POSTIVE SARS-CoV-2 nucleic acids MAY BE PRESENT.   A presumptive positive result was obtained on the submitted specimen  and confirmed on repeat testing.  While 2019 novel coronavirus  (SARS-CoV-2) nucleic acids may be present in the submitted sample  additional confirmatory testing may be necessary for epidemiological  and / or clinical management purposes  to differentiate between  SARS-CoV-2 and other Sarbecovirus currently known to infect humans.  If clinically indicated additional testing with an alternate test   methodology [phone]) is advised. The SARS-CoV-2 RNA is generally  detectable in upper and lower respiratory sp ecimens during the acute  phase of infection. The expected result is Negative. Fact Sheet for Patients:  StrictlyIdeas.no Fact Sheet for Healthcare Providers: BankingDealers.co.za This test is not yet approved or cleared by the [place] FDA and has been authorized for detection and/or diagnosis of SARS-CoV-2 by FDA under an Emergency Use Authorization (EUA).  This EUA will remain in effect (meaning this test can be used) for the duration of the COVID-19 declaration under Section 564(b)(1) of the Act, 21 U.S.C. section 360bbb-3(b)(1), unless the authorization is terminated or revoked sooner. Performed at [facility], [place] [address] [place], [place] [address]   Lactic acid, plasma     Status: Abnormal   Collection Time: 01/11/19  8:55 PM  Result Value Ref Range   Lactic Acid, Venous 2.3 (HH) 0.5 - 1.9 mmol/L    Comment: CRITICAL RESULT CALLED TO, READ BACK BY AND VERIFIED WITH: Delrae Rend RN 01/11/2019 AT 2150 BY H SOEWARDIMAN MT Performed at [facility], [place] [address] [place], [place] [address]   Lipase, blood     Status: None   Collection Time: 01/11/19  8:55 PM  Result Value Ref Range   Lipase 27 11 - 51 U/L    Comment: Performed at [facility],  [address] [place], [place] [address]  CBG monitoring, ED     Status: Abnormal   Collection Time: 01/11/19  9:05 PM  Result Value Ref Range   Glucose-Capillary 249 (H) 70 - 99 mg/dL  POCT I-Stat EG7     Status: Abnormal   Collection Time: 01/11/19 10:25 PM  Result Value Ref Range   pH, Ven 7.462 (H) 7.250 - 7.430   pCO2, Ven 28.1 (L) 44.0 - 60.0 mmHg   pO2, Ven 47.0 (H) 32.0 - 45.0 mmHg   Bicarbonate 20.1 20.0 - 28.0 mmol/L   TCO2 21 (L) 22 - 32 mmol/L   O2 Saturation 86.0 %   Acid-base deficit 3.0 (H) 0.0 - 2.0 mmol/L   Sodium 137 135 -  145 mmol/L   Potassium 3.4 (L) 3.5 - 5.1 mmol/L   Calcium, Ion 1.17 1.15 - 1.40 mmol/L   HCT 37.0 36.0 - 46.0 %   Hemoglobin 12.6 12.0 - 15.0 g/dL   Patient temperature HIDE    Sample type VENOUS   Basic metabolic panel     Status: Abnormal   Collection Time: 01/11/19 11:38 PM  Result Value Ref Range   Sodium 137 135 - 145 mmol/L   Potassium 3.4 (L) 3.5 - 5.1 mmol/L   Chloride 106 98 - 111 mmol/L   CO2 19 (L) 22 - 32 mmol/L   Glucose, Bld 76 70 - 99 mg/dL   BUN 15 6 - 20 mg/dL   Creatinine, Ser 1.06 (H) 0.44 - 1.00 mg/dL   Calcium 8.2 (L) 8.9 - 10.3 mg/dL   GFR calc non Af Amer >60 >60 mL/min   GFR calc Af Amer >60 >60 mL/min   Anion gap 12 5 - 15    Comment: Performed at [facility], [place] [address], [place], [place] [address]  Lactic acid, plasma     Status: Abnormal   Collection Time: 01/11/19 11:38 PM  Result Value Ref Range   Lactic Acid, Venous 2.8 (HH) 0.5 - 1.9 mmol/L    Comment: CRITICAL RESULT CALLED TO, READ BACK BY AND VERIFIED WITH: Verneta Hamidi,T RN 01/12/2019 0024 JORDANS Performed at [facility], [place] [address] [place], [place] [address]   Procalcitonin - Baseline     Status: None   Collection Time: 01/11/19 11:38 PM  Result Value Ref Range   Procalcitonin <0.10 ng/mL    Comment:        Interpretation: PCT (Procalcitonin) <= 0.5 ng/mL: Systemic infection (sepsis) is not likely. Local bacterial infection is possible. (NOTE)       Sepsis PCT Algorithm           Lower Respiratory Tract                                      Infection PCT Algorithm    ----------------------------     ----------------------------         PCT < 0.25 ng/mL                PCT < 0.10 ng/mL         Strongly encourage             Strongly discourage   discontinuation of antibiotics    initiation of antibiotics    ----------------------------     -----------------------------       PCT 0.25 - 0.50 ng/mL  PCT 0.10 - 0.25 ng/mL               OR       >80% decrease in  PCT            Discourage initiation of                                            antibiotics      Encourage discontinuation           of antibiotics    ----------------------------     -----------------------------         PCT >= 0.50 ng/mL              PCT 0.26 - 0.50 ng/mL               AND        <80% decrease in PCT             Encourage initiation of                                             antibiotics       Encourage continuation           of antibiotics    ----------------------------     -----------------------------        PCT >= 0.50 ng/mL                  PCT > 0.50 ng/mL               AND         increase in PCT                  Strongly encourage                                      initiation of antibiotics    Strongly encourage escalation           of antibiotics                                     -----------------------------                                           PCT <= 0.25 ng/mL                                                 OR                                        > 80% decrease in PCT  Discontinue / Do not initiate                                             antibiotics Performed at [facility], [place] [address] [place], [place] [address]   CBG monitoring, ED     Status: None   Collection Time: 01/12/19  3:37 AM  Result Value Ref Range   Glucose-Capillary 86 70 - 99 mg/dL  Lactic acid, plasma     Status: None   Collection Time: 01/12/19  5:49 AM  Result Value Ref Range   Lactic Acid, Venous 1.1 0.5 - 1.9 mmol/L    Comment: Performed at [facility] [address], [place], [place] [address]  Urinalysis, Routine w reflex microscopic     Status: Abnormal   Collection Time: 01/12/19  6:30 AM  Result Value Ref Range   Color, Urine YELLOW YELLOW   APPearance CLOUDY (A) CLEAR   Specific Gravity, Urine >1.046 (H) 1.005 - 1.030   pH 5.0 5.0 - 8.0   Glucose, UA 50 (A) NEGATIVE mg/dL   Hgb urine  dipstick SMALL (A) NEGATIVE   Bilirubin Urine NEGATIVE NEGATIVE   Ketones, ur NEGATIVE NEGATIVE mg/dL   Protein, ur NEGATIVE NEGATIVE mg/dL   Nitrite NEGATIVE NEGATIVE   Leukocytes,Ua MODERATE (A) NEGATIVE   RBC / HPF 6-10 0 - 5 RBC/hpf   WBC, UA 21-50 0 - 5 WBC/hpf   Bacteria, UA RARE (A) NONE SEEN   Squamous Epithelial / LPF 21-50 0 - 5   Mucus PRESENT    Non Squamous Epithelial 0-5 (A) NONE SEEN    Comment: Performed at [facility], [place] [address], [place], [place] [address]  CBG monitoring, ED     Status: Abnormal   Collection Time: 01/12/19  6:37 AM  Result Value Ref Range   Glucose-Capillary 112 (H) 70 - 99 mg/dL   Comment 1 Notify RN    Comment 2 Document in Chart    Ct Abdomen Pelvis W Contrast  Result Date: 01/12/2019 CLINICAL DATA:  38 year old female with abdominal pain. EXAM: CT ABDOMEN AND PELVIS WITH CONTRAST TECHNIQUE: Multidetector CT imaging of the abdomen and pelvis was performed using the standard protocol following bolus administration of intravenous contrast. CONTRAST:  12mL OMNIPAQUE IOHEXOL 300 MG/ML  SOLN COMPARISON:  Abdominal CT dated 06/19/2018 FINDINGS: Lower chest: The visualized lungs are clear. There is no cardiomegaly or pericardial effusion. No intra-abdominal free air or free fluid. Hepatobiliary: Diffuse fatty infiltration of the liver. No intrahepatic biliary ductal dilatation. The gallbladder is unremarkable. Pancreas: Unremarkable. No pancreatic ductal dilatation or surrounding inflammatory changes. Spleen: Normal in size without focal abnormality. Adrenals/Urinary Tract: Adrenal glands are unremarkable. Kidneys are normal, without renal calculi, focal lesion, or hydronephrosis. Bladder is unremarkable. Stomach/Bowel: There is a small hiatal hernia. There is no bowel obstruction. Mildly thickened jejunal loops may be related to underdistention although mild enteritis is not entirely excluded. Clinical correlation is recommended. Loose stool noted  within the colon and rectum compatible with diarrheal state. Clinical correlation is recommended. The appendix is normal. Vascular/Lymphatic: The abdominal aorta and IVC appear unremarkable. No portal venous gas. There is no adenopathy. There is minimal haziness of the upper mesentery with multiple small lymph nodes with a "misty mesentery" appearance. This finding is nonspecific but may be related to underlying inflammatory/infectious etiology. Reproductive: The uterus  is anteverted. An intrauterine device is noted. The ovaries are unremarkable. Other: None Musculoskeletal: Bilateral L5 pars defects. No listhesis. No acute osseous pathology. IMPRESSION: 1. Diarrheal state with findings of possible mild enteritis. Clinical correlation is recommended. No bowel obstruction. Normal appendix. 2. Fatty liver. Electronically Signed   By: Anner Crete M.D.   On: 01/12/2019 00:36   Dg Chest Port 1 View  Result Date: 01/11/2019 CLINICAL DATA:  Fever. EXAM: PORTABLE CHEST 1 VIEW COMPARISON:  09/29/2018. FINDINGS: Normal sized heart. Clear lungs. Normal appearing bones. IMPRESSION: Normal examination. Electronically Signed   By: Claudie Revering M.D.   On: 01/11/2019 20:47    Pending Labs Unresulted Labs (From admission, onward)    Start     Ordered   01/12/19 9381  Basic metabolic panel  ONCE - STAT,   R     01/12/19 0509   01/12/19 0510  Troponin I - ONCE - STAT  ONCE - STAT,   R     01/12/19 0509   01/11/19 2330  Gastrointestinal Panel by PCR , Stool  (Gastrointestinal Panel by PCR, Stool)  Once,   R     01/11/19 2329   01/11/19 2325  HIV antibody (Routine Testing)  Once,   R     01/11/19 2328   01/11/19 1645  Culture, blood (Routine x 2)  BLOOD CULTURE X 2,   STAT     01/11/19 1644          Vitals/Pain Today's Vitals   01/11/19 1642 01/11/19 1934 01/11/19 1941 01/12/19 0610  BP:  109/79  94/74  Pulse:  (!) 131  86  Resp:  16  (!) 23  Temp:  98.1 F (36.7 C)  98.1 F (36.7 C)  TempSrc:  Oral   Oral  SpO2:  99%  97%  Weight:   98.4 kg   Height:   5\' 4"  (1.626 m)   PainSc: 2        Isolation Precautions Enteric precautions (UV disinfection)  Medications Medications  ceFEPIme (MAXIPIME) 2 g in sodium chloride 0.9 % 100 mL IVPB (0 g Intravenous Stopped 01/12/19 0623)  vancomycin (VANCOCIN) 1,500 mg in sodium chloride 0.9 % 500 mL IVPB (has no administration in time range)  acetaminophen (TYLENOL) tablet 650 mg (has no administration in time range)    Or  acetaminophen (TYLENOL) suppository 650 mg (has no administration in time range)  ondansetron (ZOFRAN) tablet 4 mg (has no administration in time range)    Or  ondansetron (ZOFRAN) injection 4 mg (has no administration in time range)  insulin aspart (novoLOG) injection 0-9 Units (0 Units Subcutaneous Not Given 01/12/19 0644)  0.9 %  sodium chloride infusion (has no administration in time range)  ceFEPIme (MAXIPIME) 2 g in sodium chloride 0.9 % 100 mL IVPB (0 g Intravenous Stopped 01/11/19 2207)  metroNIDAZOLE (FLAGYL) IVPB 500 mg (0 mg Intravenous Stopped 01/11/19 2221)  sodium chloride 0.9 % bolus 1,000 mL (0 mLs Intravenous Stopped 01/11/19 2118)    And  sodium chloride 0.9 % bolus 800 mL (0 mLs Intravenous Stopped 01/11/19 2235)  insulin aspart (novoLOG) injection 10 Units (10 Units Intravenous Given 01/11/19 2213)  vancomycin (VANCOCIN) 2,000 mg in sodium chloride 0.9 % 500 mL IVPB (0 mg Intravenous Stopped 01/12/19 0039)  iohexol (OMNIPAQUE) 300 MG/ML solution 100 mL (100 mLs Intravenous Contrast Given 01/11/19 2255)  diphenhydrAMINE (BENADRYL) injection 25 mg (25 mg Intravenous Given 01/12/19 0036)  sodium chloride 0.9 % bolus 500 mL (500 mLs  Intravenous New Bag/Given 01/12/19 0550)    Mobility walks Low fall risk   Focused Assessments GI   R Recommendations: See Admitting Provider Note  Report given to:   Additional Notes:

## 2019-01-13 LAB — CBC
HCT: 34 % — ABNORMAL LOW (ref 36.0–46.0)
Hemoglobin: 10.4 g/dL — ABNORMAL LOW (ref 12.0–15.0)
MCH: 24.6 pg — ABNORMAL LOW (ref 26.0–34.0)
MCHC: 30.6 g/dL (ref 30.0–36.0)
MCV: 80.6 fL (ref 80.0–100.0)
Platelets: 288 10*3/uL (ref 150–400)
RBC: 4.22 MIL/uL (ref 3.87–5.11)
RDW: 16.3 % — ABNORMAL HIGH (ref 11.5–15.5)
WBC: 5.4 10*3/uL (ref 4.0–10.5)
nRBC: 0 % (ref 0.0–0.2)

## 2019-01-13 LAB — URINE CULTURE: Culture: NO GROWTH

## 2019-01-13 LAB — COMPREHENSIVE METABOLIC PANEL
ALT: 39 U/L (ref 0–44)
AST: 31 U/L (ref 15–41)
Albumin: 2.8 g/dL — ABNORMAL LOW (ref 3.5–5.0)
Alkaline Phosphatase: 88 U/L (ref 38–126)
Anion gap: 8 (ref 5–15)
BUN: <5 mg/dL — ABNORMAL LOW (ref 6–20)
CO2: 24 mmol/L (ref 22–32)
Calcium: 8.8 mg/dL — ABNORMAL LOW (ref 8.9–10.3)
Chloride: 108 mmol/L (ref 98–111)
Creatinine, Ser: 0.88 mg/dL (ref 0.44–1.00)
GFR calc Af Amer: >60 mL/min (ref >60)
GFR calc non Af Amer: >60 mL/min (ref >60)
Glucose, Bld: 114 mg/dL — ABNORMAL HIGH (ref 70–99)
Potassium: 3.5 mmol/L (ref 3.5–5.1)
Sodium: 140 mmol/L (ref 135–145)
Total Bilirubin: 0.4 mg/dL (ref 0.3–1.2)
Total Protein: 5.5 g/dL — ABNORMAL LOW (ref 6.5–8.1)

## 2019-01-13 LAB — GLUCOSE, CAPILLARY
Glucose-Capillary: 103 mg/dL — ABNORMAL HIGH (ref 70–99)
Glucose-Capillary: 151 mg/dL — ABNORMAL HIGH (ref 70–99)
Glucose-Capillary: 125 mg/dL — ABNORMAL HIGH (ref 70–99)
Glucose-Capillary: 183 mg/dL — ABNORMAL HIGH (ref 70–99)

## 2019-01-13 MED ORDER — ONDANSETRON HCL 4 MG PO TABS: 4.0000 mg | ORAL_TABLET | Freq: Four times a day (QID) | ORAL | 0 refills | Status: DC | PRN

## 2019-01-13 NOTE — Discharge Summary (Addendum)
Progress Note    Lisa Duran JEH:[id number] DOB: 1981/07/30 DOA: 01/11/2019  PCP: Binnie Rail, MD  Patient coming from: Home.  Chief Complaint: Nausea vomiting diarrhea elevated blood sugar.  HPI: Lisa Duran is a 38 y.o. female with history of diabetes mellitus type 2, migraine, fibromyalgia, depression, ADHD has been experiencing nausea vomiting diarrhea with epigastric pain for the last 4 days.  Denies any blood in the vomitus or diarrhea.  Subjective feeling of fever chills.  Denies chest pain or shortness of breath.  Has been taking her insulin despite patient has been not eating well but unable to keep anything.  Had gone to urgent care center and was found to have elevated blood sugar and concern for pain was referred to the ER for possible DKA. In the ED patient was tachycardic with a heart rate in the 130s to 140s with complete metabolic panel showing sodium of 132 bicarb 18 blood glucose 303 anion gap 16 CBC showing WBC of 7.8 hemoglobin 14.3 platelets 444 lactate was 2.5.  Chest x-ray unremarkable UA pending.  CT abdomen pelvis done shows features concerning for enteritis.  Given the patient's tachycardia and elevated lactate symptoms are concerning for developing sepsis patient was placed on antibiotics.  Fluid bolus was given for sepsis protocol.  Patient admitted as above with intractable nausea vomiting abdominal pain with hyperglycemia concerning for DKA at urgent care.  Admission here patient noted to have questionable enteritis on imaging.  Patient's symptoms improved drastically, now able to tolerate p.o. quickly, nausea vomiting greatly improved with supportive care and Zofran.  Otherwise stable and agreeable for discharge.  Patient needs close follow-up with PCP to continue to manage diabetes, glucose levels have actually been moderately well controlled by GI illness.  Likely viral in nature given patient's symptoms.  Patient did have questionable abnormal UA on admission  concerning for UTI which is now status post treatment with ceftriaxone as below.  Follow-up with PCP for further evaluation treatment later this month to ensure resolution of symptoms.  Review of Systems: As per HPI, rest all negative.   Past Medical History:  Diagnosis Date   ADHD (attention deficit hyperactivity disorder)    ALLERGIC RHINITIS    Allergy    Anemia    ANXIETY    Depression    DIABETES MELLITUS, TYPE II    Fibromyalgia 2018   GERD (gastroesophageal reflux disease)    MIGRAINE, COMMON    Obesity, unspecified    OVARIAN CYST     Past Surgical History:  Procedure Laterality Date   NO PAST SURGERIES       reports that she quit smoking about 16 years ago. She has never used smokeless tobacco. She reports current alcohol use. She reports that she does not use drugs.  Allergies  Allergen Reactions   Chicken Allergy Anaphylaxis and Shortness Of Breath   Other Itching, Swelling and Other (See Comments)    NUTS (of ANY kind): Lips and mouth swell, but no breathing impairment & migraines and eczema are triggered   Propofol Shortness Of Breath    SOB, chest tightness, wheezing after colonoscopy on 09-18-15   Adhesive [Tape] Other (See Comments)    Makes the skin VERY RED    Gluten Meal Diarrhea   Peanut-Containing Drug Products Itching, Swelling and Other (See Comments)    Lips and mouth swell, but no breathing impairment & migraines and eczema are triggered    Victoza [Liraglutide] Rash    Family History  Problem  Relation Age of Onset   Diabetes Mother    Hyperlipidemia Mother    Diabetes Father    Hyperlipidemia Father    Coronary artery disease Father    Hypertension Father    Cancer Father        esoph   Stroke Father    Cancer Maternal Grandfather 10       Prostate and Lung   Diabetes Sister        Gestational   Graves' disease Maternal Aunt    Cancer Maternal Uncle        Brain   Stroke Paternal Grandmother     Heart disease Paternal Grandmother    Stroke Paternal Grandfather    Heart disease Paternal Grandfather    Diabetes Sister        Gestational and Type II   Hashimoto's thyroiditis Cousin    Cancer Maternal Uncle        Brain    Prior to Admission medications   Medication Sig Start Date End Date Taking? Authorizing Provider  almotriptan (AXERT) 12.5 MG tablet Take 1 tablet (12.5 mg total) by mouth as needed for migraine. may repeat in 2 hours if needed 10/20/18  Yes Burns, Claudina Lick, MD  ALPRAZolam (XANAX) 0.5 MG tablet TAKE 1 TABLET(0.5 MG) BY MOUTH THREE TIMES DAILY AS NEEDED FOR ANXIETY Patient taking differently: Take 0.5 mg by mouth 3 (three) times daily as needed for anxiety.  10/05/18  Yes Burns, Claudina Lick, MD  amphetamine-dextroamphetamine (ADDERALL XR) 20 MG 24 hr capsule Take 1 capsule (20 mg total) by mouth daily. Patient taking differently: Take 20 mg by mouth See admin instructions. Take 20 mg by mouth once a day only on school or work days 12/29/18  Yes Burns, Claudina Lick, MD  DULoxetine (CYMBALTA) 60 MG capsule Take 1 capsule (60 mg total) by mouth daily. 10/20/18  Yes Burns, Claudina Lick, MD  fluconazole (DIFLUCAN) 150 MG tablet Take 1 tablet (150 mg total) by mouth daily for 3 days. 01/11/19 01/14/19 Yes Burns, Claudina Lick, MD  fluticasone (FLONASE) 50 MCG/ACT nasal spray Place 2 sprays into both nostrils daily. 10/20/18  Yes Burns, Claudina Lick, MD  gabapentin (NEURONTIN) 100 MG capsule Take 2 capsules (200 mg total) by mouth at bedtime. 10/20/18  Yes Burns, Claudina Lick, MD  glipiZIDE (GLUCOTROL XL) 5 MG 24 hr tablet Take 1 tablet (5 mg total) by mouth daily with breakfast. 01/22/18  Yes Philemon Kingdom, MD  Insulin Glargine (BASAGLAR KWIKPEN) 100 UNIT/ML SOPN Use to inject 32 units at bedtime 01/05/19  Yes Philemon Kingdom, MD  lamoTRIgine (LAMICTAL) 100 MG tablet TAKE 1 TABLET BY MOUTH TWO  TIMES DAILY Patient taking differently: Take 100 mg by mouth daily.  08/05/16  Yes Burns, Claudina Lick, MD    levonorgestrel (MIRENA) 20 MCG/24HR IUD 1 Intra Uterine Device (1 each total) by Intrauterine route once. 10/03/13  Yes Rowe Clack, MD  metFORMIN (GLUCOPHAGE) 1000 MG tablet TAKE 1 TABLET BY  MOUTH TWICE A DAY WITH A  MEAL Patient taking differently: Take 1,000 mg by mouth 2 (two) times daily with a meal.  01/22/18  Yes Philemon Kingdom, MD  metoprolol succinate (TOPROL-XL) 25 MG 24 hr tablet Take 0.5 tablets (12.5 mg total) by mouth daily. 10/20/18  Yes Burns, Claudina Lick, MD  omeprazole (PRILOSEC) 40 MG capsule TAKE 1 CAPSULE BY MOUTH  DAILY BEFORE SUPPER Patient taking differently: Take 40 mg by mouth daily before supper.  10/20/18  Yes Binnie Rail, MD  ondansetron (ZOFRAN-ODT) 4 MG disintegrating tablet Take 4 mg by mouth every 8 (eight) hours as needed for nausea or vomiting.   Yes [provider]  Prenatal Vit-Fe Fumarate-FA (MULTIVITAMIN-PRENATAL) 27-0.8 MG TABS tablet Take 1 tablet by mouth every other day.    Yes [provider]  sitaGLIPtin (JANUVIA) 100 MG tablet Take 1 tablet (100 mg total) by mouth daily. 11/03/18  Yes Philemon Kingdom, MD  triamcinolone cream (KENALOG) 0.1 % Apply topically 2 (two) times daily as needed. Patient taking differently: Apply 1 application topically 2 (two) times daily as needed (for itching).  06/20/14  Yes Rowe Clack, MD  glucose blood (ONETOUCH VERIO) test strip Use 2x a day 01/22/18   Philemon Kingdom, MD  Insulin Pen Needle (BD PEN NEEDLE NANO U/F) 32G X 4 MM MISC USE ONCE A DAY 01/04/19   Philemon Kingdom, MD  iron polysaccharides (NIFEREX) 150 MG capsule Take 1 capsule (150 mg total) by mouth daily. Patient not taking: Reported on 01/11/2019 05/27/17   Brunetta Genera, MD    Physical Exam: Vitals:   01/12/19 2039 01/13/19 0324 01/13/19 0816 01/13/19 1155  BP: 115/90 102/68 126/77 116/75  Pulse: 91 74 89 98  Resp: 18 17  18   Temp: 97.9 F (36.6 C) 97.7 F (36.5 C) 98.2 F (36.8 C) 98.5 F (36.9 C)   TempSrc: Oral Oral Oral   SpO2: 100% 98% 100% 97%  Weight:  101.6 kg    Height:          Constitutional: Moderately built and nourished. Vitals:   01/12/19 2039 01/13/19 0324 01/13/19 0816 01/13/19 1155  BP: 115/90 102/68 126/77 116/75  Pulse: 91 74 89 98  Resp: 18 17  18   Temp: 97.9 F (36.6 C) 97.7 F (36.5 C) 98.2 F (36.8 C) 98.5 F (36.9 C)  TempSrc: Oral Oral Oral   SpO2: 100% 98% 100% 97%  Weight:  101.6 kg    Height:       General:  Pleasantly resting in bed, No acute distress. HEENT:  Normocephalic atraumatic.  Sclerae nonicteric, noninjected.  Extraocular movements intact bilaterally. Neck:  Without mass or deformity.  Trachea is midline. Lungs:  Clear to auscultate bilaterally without rhonchi, wheeze, or rales. Heart:  Regular rate and rhythm.  Without murmurs, rubs, or gallops. Abdomen:  Soft, nontender, nondistended.  Without guarding or rebound. Extremities: Without cyanosis, clubbing, edema, or obvious deformity. Vascular:  Dorsalis pedis and posterior tibial pulses palpable bilaterally. Skin:  Warm and dry, no erythema, no ulcerations.   Labs on Admission: I have personally reviewed following labs and imaging studies  CBC: Recent Labs  Lab 01/11/19 1651 01/11/19 2225 01/13/19 0432  WBC 7.8  --  5.4  NEUTROABS 4.4  --   --   HGB 14.3 12.6 10.4*  HCT 45.9 37.0 34.0*  MCV 77.9*  --  80.6  PLT 444*  --  938   Basic Metabolic Panel: Recent Labs  Lab 01/11/19 1651 01/11/19 2225 01/11/19 2338 01/12/19 0550 01/13/19 0432  NA 132* 137 137 139 140  K 4.0 3.4* 3.4* 3.4* 3.5  CL 98  --  106 110 108  CO2 18*  --  19* 20* 24  GLUCOSE 303*  --  76 108* 114*  BUN 13  --  15 11 <5*  CREATININE 0.96  --  1.06* 0.85 0.88  CALCIUM 9.6  --  8.2* 8.0* 8.8*   GFR: Estimated Creatinine Clearance: 98.6 mL/min (by C-G formula based on SCr  of 0.88 mg/dL). Liver Function Tests: Recent Labs  Lab 01/11/19 1651 01/13/19 0432  AST 33 31  ALT 67* 39   ALKPHOS 138* 88  BILITOT 0.7 0.4  PROT 8.0 5.5*  ALBUMIN 4.0 2.8*   Recent Labs  Lab 01/11/19 2055  LIPASE 27   No results for input(s): AMMONIA in the last 168 hours. Coagulation Profile: Recent Labs  Lab 01/11/19 1651  INR 1.1   Cardiac Enzymes: Recent Labs  Lab 01/12/19 0550  TROPONINI <0.03   BNP (last 3 results) No results for input(s): PROBNP in the last 8760 hours. HbA1C: No results for input(s): HGBA1C in the last 72 hours. CBG: Recent Labs  Lab 01/12/19 2040 01/13/19 0007 01/13/19 0326 01/13/19 0839 01/13/19 1146  GLUCAP 194* 151* 103* 125* 183*   Lipid Profile: No results for input(s): CHOL, HDL, LDLCALC, TRIG, CHOLHDL, LDLDIRECT in the last 72 hours. Thyroid Function Tests: No results for input(s): TSH, T4TOTAL, FREET4, T3FREE, THYROIDAB in the last 72 hours. Anemia Panel: No results for input(s): VITAMINB12, FOLATE, FERRITIN, TIBC, IRON, RETICCTPCT in the last 72 hours. Urine analysis:    Component Value Date/Time   COLORURINE YELLOW 01/12/2019 0630   APPEARANCEUR CLOUDY (A) 01/12/2019 0630   LABSPEC >1.046 (H) 01/12/2019 0630   PHURINE 5.0 01/12/2019 0630   GLUCOSEU 50 (A) 01/12/2019 0630   GLUCOSEU NEGATIVE 01/26/2012 0859   HGBUR SMALL (A) 01/12/2019 0630   BILIRUBINUR NEGATIVE 01/12/2019 0630   KETONESUR NEGATIVE 01/12/2019 0630   PROTEINUR NEGATIVE 01/12/2019 0630   UROBILINOGEN 0.2 07/19/2014 1923   NITRITE NEGATIVE 01/12/2019 0630   LEUKOCYTESUR MODERATE (A) 01/12/2019 0630   Sepsis Labs: @LABRCNTIP (procalcitonin:4,lacticidven:4) ) Recent Results (from the past 240 hour(s))  Culture, blood (Routine x 2)     Status: None (Preliminary result)   Collection Time: 01/11/19  4:47 PM  Result Value Ref Range Status   Specimen Description BLOOD LEFT ANTECUBITAL  Final   Special Requests   Final    BOTTLES DRAWN AEROBIC AND ANAEROBIC Blood Culture results may not be optimal due to an inadequate volume of blood received in culture bottles    Culture   Final    NO GROWTH 2 DAYS Performed at [facility], [place] [address], [place], [place] [address]    Report Status PENDING  Incomplete  SARS Coronavirus 2 (CEPHEID- Performed in [facility]), Hosp Order     Status: None   Collection Time: 01/11/19  8:06 PM  Result Value Ref Range Status   SARS Coronavirus 2 NEGATIVE NEGATIVE Final    Comment: (NOTE) If result is NEGATIVE SARS-CoV-2 target nucleic acids are NOT DETECTED. The SARS-CoV-2 RNA is generally detectable in upper and lower  respiratory specimens during the acute phase of infection. The lowest  concentration of SARS-CoV-2 viral copies this assay can detect is 250  copies / mL. A negative result does not preclude SARS-CoV-2 infection  and should not be used as the sole basis for treatment or other  patient management decisions.  A negative result may occur with  improper specimen collection / handling, submission of specimen other  than nasopharyngeal swab, presence of viral mutation(s) within the  areas targeted by this assay, and inadequate number of viral copies  (<250 copies / mL). A negative result must be combined with clinical  observations, patient history, and epidemiological information. If result is POSITIVE SARS-CoV-2 target nucleic acids are DETECTED. The SARS-CoV-2 RNA is generally detectable in upper and lower  respiratory specimens dur  ing the acute phase of infection.  Positive  results are indicative of active infection with SARS-CoV-2.  Clinical  correlation with patient history and other diagnostic information is  necessary to determine patient infection status.  Positive results do  not rule out bacterial infection or co-infection with other viruses. If result is PRESUMPTIVE POSTIVE SARS-CoV-2 nucleic acids MAY BE PRESENT.   A presumptive positive result was obtained on the submitted specimen  and confirmed on repeat testing.  While 2019 novel coronavirus  (SARS-CoV-2)  nucleic acids may be present in the submitted sample  additional confirmatory testing may be necessary for epidemiological  and / or clinical management purposes  to differentiate between  SARS-CoV-2 and other Sarbecovirus currently known to infect humans.  If clinically indicated additional testing with an alternate test  methodology [phone]) is advised. The SARS-CoV-2 RNA is generally  detectable in upper and lower respiratory sp ecimens during the acute  phase of infection. The expected result is Negative. Fact Sheet for Patients:  StrictlyIdeas.no Fact Sheet for Healthcare Providers: BankingDealers.co.za This test is not yet approved or cleared by the [place] FDA and has been authorized for detection and/or diagnosis of SARS-CoV-2 by FDA under an Emergency Use Authorization (EUA).  This EUA will remain in effect (meaning this test can be used) for the duration of the COVID-19 declaration under Section 564(b)(1) of the Act, 21 U.S.C. section 360bbb-3(b)(1), unless the authorization is terminated or revoked sooner. Performed at [facility], [place] [address], [place], [place] [address]   Culture, blood (Routine x 2)     Status: None (Preliminary result)   Collection Time: 01/11/19  8:44 PM  Result Value Ref Range Status   Specimen Description BLOOD RIGHT ARM  Final   Special Requests   Final    BOTTLES DRAWN AEROBIC AND ANAEROBIC Blood Culture adequate volume   Culture   Final    NO GROWTH 2 DAYS Performed at [facility], [address] [place], [place] [address]    Report Status PENDING  Incomplete  Culture, Urine     Status: None   Collection Time: 01/12/19  6:20 AM  Result Value Ref Range Status   Specimen Description URINE, RANDOM  Final   Special Requests NONE  Final   Culture   Final    NO GROWTH Performed at [facility], [address] [place], [place] [address]    Report Status 01/13/2019  FINAL  Final     Radiological Exams on Admission: Ct Abdomen Pelvis W Contrast  Result Date: 01/12/2019 CLINICAL DATA:  38 year old female with abdominal pain. EXAM: CT ABDOMEN AND PELVIS WITH CONTRAST TECHNIQUE: Multidetector CT imaging of the abdomen and pelvis was performed using the standard protocol following bolus administration of intravenous contrast. CONTRAST:  12mL OMNIPAQUE IOHEXOL 300 MG/ML  SOLN COMPARISON:  Abdominal CT dated 06/19/2018 FINDINGS: Lower chest: The visualized lungs are clear. There is no cardiomegaly or pericardial effusion. No intra-abdominal free air or free fluid. Hepatobiliary: Diffuse fatty infiltration of the liver. No intrahepatic biliary ductal dilatation. The gallbladder is unremarkable. Pancreas: Unremarkable. No pancreatic ductal dilatation or surrounding inflammatory changes. Spleen: Normal in size without focal abnormality. Adrenals/Urinary Tract: Adrenal glands are unremarkable. Kidneys are normal, without renal calculi, focal lesion, or hydronephrosis. Bladder is unremarkable. Stomach/Bowel: There is a small hiatal hernia. There is no bowel obstruction. Mildly thickened jejunal loops may be related to underdistention although mild enteritis is not entirely excluded. Clinical correlation is recommended. Loose  stool noted within the colon and rectum compatible with diarrheal state. Clinical correlation is recommended. The appendix is normal. Vascular/Lymphatic: The abdominal aorta and IVC appear unremarkable. No portal venous gas. There is no adenopathy. There is minimal haziness of the upper mesentery with multiple small lymph nodes with a "misty mesentery" appearance. This finding is nonspecific but may be related to underlying inflammatory/infectious etiology. Reproductive: The uterus is anteverted. An intrauterine device is noted. The ovaries are unremarkable. Other: None Musculoskeletal: Bilateral L5 pars defects. No listhesis. No acute osseous pathology.  IMPRESSION: 1. Diarrheal state with findings of possible mild enteritis. Clinical correlation is recommended. No bowel obstruction. Normal appendix. 2. Fatty liver. Electronically Signed   By: Anner Crete M.D.   On: 01/12/2019 00:36   Dg Chest Port 1 View  Result Date: 01/11/2019 CLINICAL DATA:  Fever. EXAM: PORTABLE CHEST 1 VIEW COMPARISON:  09/29/2018. FINDINGS: Normal sized heart. Clear lungs. Normal appearing bones. IMPRESSION: Normal examination. Electronically Signed   By: Claudie Revering M.D.   On: 01/11/2019 20:47    EKG: Independently reviewed.  Sinus tachycardia nonspecific ST-T changes.  Assessment/Plan Principal Problem:   Sepsis secondary to UTI [facility]) Active Problems:   Fibromyalgia   Iron deficiency anemia   SIRS (systemic inflammatory response syndrome) (HCC)   Nausea vomiting and diarrhea   Uncontrolled type 2 diabetes mellitus with hyperglycemia (HCC)   Enteritis    Sepsis, likely multifactorial in the setting of viral gastroenteritis; unlikely UTI, POA -Patient's GI symptoms likely viral in etiology given rapid onset and -Follow cultures -Patient admits to questionable dysuria during episodes of intractable nausea vomiting, questionable fevers chills over the last 24 hours - urinary symptoms resolved  Acute intractable nausea vomiting abdominal pain secondary to above -Improving somewhat with IV fluids, Zofran, Phenergan -Still unable to tolerate p.o. adequately enough to maintain needs -Continue IV fluids over the next 24 hours, once patient able to adequately take p.o. we will discontinue IV fluids  Anion gap metabolic acidosis secondary to lactic acidosis in the setting of above, resolved -Improving with IV fluids -Continue IV fluids until patient's p.o. 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  History of mood disorder Continue on Lamictal  DVT prophylaxis: Lovenox. Code Status: Full code. Family Communication: Discussed with patient. Disposition Plan: Discharge home Consults called: None. Admission status: Inpatient - DC home  Little Ishikawa MD [facility] Pager [phone].  If 7PM-7AM, please contact night-coverage [email or web address] Password [email or web address]  01/13/2019, 1:16 PM

## 2019-01-13 NOTE — Discharge Instructions (Signed)
Diabetes Mellitus and Nutrition, Adult  When you have diabetes (diabetes mellitus), it is very important to have healthy eating habits because your blood sugar (glucose) levels are greatly affected by what you eat and drink. Eating healthy foods in the appropriate amounts, at about the same times every day, can help you:  · Control your blood glucose.  · Lower your risk of heart disease.  · Improve your blood pressure.  · Reach or maintain a healthy weight.  Every person with diabetes is different, and each person has different needs for a meal plan. Your health care provider may recommend that you work with a diet and nutrition specialist (dietitian) to make a meal plan that is best for you. Your meal plan may vary depending on factors such as:  · The calories you need.  · The medicines you take.  · Your weight.  · Your blood glucose, blood pressure, and cholesterol levels.  · Your activity level.  · Other health conditions you have, such as heart or kidney disease.  How do carbohydrates affect me?  Carbohydrates, also called carbs, affect your blood glucose level more than any other type of food. Eating carbs naturally raises the amount of glucose in your blood. Carb counting is a method for keeping track of how many carbs you eat. Counting carbs is important to keep your blood glucose at a healthy level, especially if you use insulin or take certain oral diabetes medicines.  It is important to know how many carbs you can safely have in each meal. This is different for every person. Your dietitian can help you calculate how many carbs you should have at each meal and for each snack.  Foods that contain carbs include:  · Bread, cereal, rice, pasta, and crackers.  · Potatoes and corn.  · Peas, beans, and lentils.  · Milk and yogurt.  · Fruit and juice.  · Desserts, such as cakes, cookies, ice cream, and candy.  How does alcohol affect me?  Alcohol can cause a sudden decrease in blood glucose (hypoglycemia),  especially if you use insulin or take certain oral diabetes medicines. Hypoglycemia can be a life-threatening condition. Symptoms of hypoglycemia (sleepiness, dizziness, and confusion) are similar to symptoms of having too much alcohol.  If your health care provider says that alcohol is safe for you, follow these guidelines:  · Limit alcohol intake to no more than 1 drink per day for nonpregnant women and 2 drinks per day for men. One drink equals 12 oz of beer, 5 oz of wine, or 1½ oz of hard liquor.  · Do not drink on an empty stomach.  · Keep yourself hydrated with water, diet soda, or unsweetened iced tea.  · Keep in mind that regular soda, juice, and other mixers may contain a lot of sugar and must be counted as carbs.  What are tips for following this plan?    Reading food labels  · Start by checking the serving size on the "Nutrition Facts" label of packaged foods and drinks. The amount of calories, carbs, fats, and other nutrients listed on the label is based on one serving of the item. Many items contain more than one serving per package.  · Check the total grams (g) of carbs in one serving. You can calculate the number of servings of carbs in one serving by dividing the total carbs by 15. For example, if a food has 30 g of total carbs, it would be equal to 2   servings of carbs.  · Check the number of grams (g) of saturated and trans fats in one serving. Choose foods that have low or no amount of these fats.  · Check the number of milligrams (mg) of salt (sodium) in one serving. Most people should limit total sodium intake to less than 2,300 mg per day.  · Always check the nutrition information of foods labeled as "low-fat" or "nonfat". These foods may be higher in added sugar or refined carbs and should be avoided.  · Talk to your dietitian to identify your daily goals for nutrients listed on the label.  Shopping  · Avoid buying canned, premade, or processed foods. These foods tend to be high in fat, sodium,  and added sugar.  · Shop around the outside edge of the grocery store. This includes fresh fruits and vegetables, bulk grains, fresh meats, and fresh dairy.  Cooking  · Use low-heat cooking methods, such as baking, instead of high-heat cooking methods like deep frying.  · Cook using healthy oils, such as olive, canola, or sunflower oil.  · Avoid cooking with butter, cream, or high-fat meats.  Meal planning  · Eat meals and snacks regularly, preferably at the same times every day. Avoid going long periods of time without eating.  · Eat foods high in fiber, such as fresh fruits, vegetables, beans, and whole grains. Talk to your dietitian about how many servings of carbs you can eat at each meal.  · Eat 4-6 ounces (oz) of lean protein each day, such as lean meat, chicken, fish, eggs, or tofu. One oz of lean protein is equal to:  ? 1 oz of meat, chicken, or fish.  ? 1 egg.  ? ¼ cup of tofu.  · Eat some foods each day that contain healthy fats, such as avocado, nuts, seeds, and fish.  Lifestyle  · Check your blood glucose regularly.  · Exercise regularly as told by your health care provider. This may include:  ? 150 minutes of moderate-intensity or vigorous-intensity exercise each week. This could be brisk walking, biking, or water aerobics.  ? Stretching and doing strength exercises, such as yoga or weightlifting, at least 2 times a week.  · Take medicines as told by your health care provider.  · Do not use any products that contain nicotine or tobacco, such as cigarettes and e-cigarettes. If you need help quitting, ask your health care provider.  · Work with a counselor or diabetes educator to identify strategies to manage stress and any emotional and social challenges.  Questions to ask a health care provider  · Do I need to meet with a diabetes educator?  · Do I need to meet with a dietitian?  · What number can I call if I have questions?  · When are the best times to check my blood glucose?  Where to find more  information:  · American Diabetes Association: diabetes.org  · Academy of Nutrition and Dietetics: www.eatright.org  · National Institute of Diabetes and Digestive and Kidney Diseases (NIH): www.niddk.nih.gov  Summary  · A healthy meal plan will help you control your blood glucose and maintain a healthy lifestyle.  · Working with a diet and nutrition specialist (dietitian) can help you make a meal plan that is best for you.  · Keep in mind that carbohydrates (carbs) and alcohol have immediate effects on your blood glucose levels. It is important to count carbs and to use alcohol carefully.  This information is not intended to   replace advice given to you by your health care provider. Make sure you discuss any questions you have with your health care provider.  Document Released: 04/24/2005 Document Revised: 02/25/2017 Document Reviewed: 09/01/2016  Elsevier Interactive Patient Education © 2019 [employer]

## 2019-01-14 ENCOUNTER — Telehealth: Payer: Self-pay | Admitting: *Deleted

## 2019-01-14 NOTE — Telephone Encounter (Signed)
Transition Care Management Follow-up Telephone Call   Date discharged? 01/13/19   How have you been since you were released from the hospital? Pt states she is doing much better   Do you understand why you were in the hospital? YES   Do you understand the discharge instructions? YES   Where were you discharged to? HOME   Items Reviewed:  Medications reviewed: YES  Allergies reviewed: YES  Dietary changes reviewed: YES, carb modified  Referrals reviewed: No referral recommended   Functional Questionnaire:   Activities of Daily Living (ADLs):   She states she are independent in the following: ambulation, bathing and hygiene, feeding, continence, grooming, toileting and dressing States she doesn't require assistance    Any transportation issues/concerns?: NO   Any patient concerns? NO   Confirmed importance and date/time of follow-up visits scheduled YES, (virtual) 01/24/19  Provider Appointment booked with Dr. Quay Burow  Confirmed with patient if condition begins to worsen call PCP or go to the ER.  Patient was given the office number and encouraged to call back with question or concerns.  : YES

## 2019-01-16 LAB — CULTURE, BLOOD (ROUTINE X 2)
Culture: NO GROWTH
Culture: NO GROWTH
Special Requests: ADEQUATE

## 2019-01-23 NOTE — Assessment & Plan Note (Signed)
Secondary to UTI and viral enteritis Resolved Improved with Abx and IVF

## 2019-01-23 NOTE — Progress Notes (Signed)
Virtual Visit via Video Note  I connected with Lisa Duran on 01/24/19 at  1:00 PM EDT by a video enabled telemedicine application and verified that I am speaking with the correct person using two identifiers.   I discussed the limitations of evaluation and management by telemedicine and the availability of in person appointments. The patient expressed understanding and agreed to proceed.  The patient is currently at home and I am in the office.    No referring provider.    History of Present Illness: This visit is for hospital follow up, TCM.  Admitted 01/11/19 - 01/13/19   She went to the ED for 4 days of nausea, vomiting, diarrhea and epigastric pain.  She denies blood in her stool or vomit.  She had subjective fever/chills. She denies chest pain or SOB.  She was taking her insulin despite not eating well.  She initially went to urgent and they directed her to the ED for evaluation of possible DKA.  ED:  She was tachycardic in the 130-140's.  CXR unremarkable. Sodium 132, bicarb 18, glucose 303, AG 16, WBC 7.8, Hgb 14.3 plt 444, lactate 2.5.  CT abd/pelvis showed possible enteritis.  She received IVF and antibiotics.    sepsis: Likely related to gastroenteritis - likely viral UA - questionable UTI  Started on antibiotics, IVF zofran prn GI pathogen panel Diet advanced She is eating well.  She has been eating more fruits and vegetables and overall feels she is eating more healthy since leaving the hospital.  She is tolerating food without difficulty  Acute intractable, nausea, vomiting, abdominal pain Due to enteritis - viral Improved with IVF, zofran, phenergan She used zofran the day after she got home-formed She denies nausea, vomiting since then  Stools are soft-formed- getting normal.  Anion gap metabolic acidosis secondary to lactic acidosis in setting of above Resolved Improved with IVF  Diabetes, type 2, uncontrolled, not DKA: Anion gap improved - likely related to  N/V Unlikely DKA - intractable vomiting in setting of enteritis and UTI SS insulin, hypoglycemic protocol Discharged home on home regimen Sugars at home have been good - highest 169, typically low 100's  Palpitations Continued on metoprolol  ADHD: Adderall  Mood disorder: lamictal    Review of Systems  Constitutional: Negative for chills and fever.  Respiratory: Negative for cough, shortness of breath and wheezing.   Cardiovascular: Negative for chest pain, palpitations and leg swelling.  Gastrointestinal: Negative for abdominal pain, blood in stool, diarrhea, heartburn, nausea and vomiting.  Neurological: Negative for dizziness and headaches.     Social History   Socioeconomic History  . Marital status: Single    Spouse name: Not on file  . Number of children: Not on file  . Years of education: Not on file  . Highest education level: Not on file  Occupational History  . Not on file  Social Needs  . Financial resource strain: Not on file  . Food insecurity    Worry: Not on file    Inability: Not on file  . Transportation needs    Medical: Not on file    Non-medical: Not on file  Tobacco Use  . Smoking status: Former Smoker    Quit date: 08/20/2002    Years since quitting: 16.4  . Smokeless tobacco: Never Used  . Tobacco comment: single, separated from spouse 02/2010.   Substance and Sexual Activity  . Alcohol use: Yes    Comment: rarely  . Drug use: No  . Sexual  activity: Not Currently    Birth control/protection: I.U.D.  Lifestyle  . Physical activity    Days per week: Not on file    Minutes per session: Not on file  . Stress: Not on file  Relationships  . Social Herbalist on phone: Not on file    Gets together: Not on file    Attends religious service: Not on file    Active member of club or organization: Not on file    Attends meetings of clubs or organizations: Not on file    Relationship status: Not on file  Other Topics Concern  .  Not on file  Social History Narrative   Single, separated from spouse 02/2010     Observations/Objective: Appears well in NAD   Lab Results  Component Value Date   WBC 5.4 01/13/2019   HGB 10.4 (L) 01/13/2019   HCT 34.0 (L) 01/13/2019   PLT 288 01/13/2019   GLUCOSE 114 (H) 01/13/2019   CHOL 193 10/20/2018   TRIG 96.0 10/20/2018   HDL 65.40 10/20/2018   LDLCALC 108 (H) 10/20/2018   ALT 39 01/13/2019   AST 31 01/13/2019   NA 140 01/13/2019   K 3.5 01/13/2019   CL 108 01/13/2019   CREATININE 0.88 01/13/2019   BUN <5 (L) 01/13/2019   CO2 24 01/13/2019   TSH 2.47 10/20/2018   INR 1.1 01/11/2019   HGBA1C 9.0 (H) 10/20/2018   MICROALBUR <0.7 10/20/2018    CT ABDOMEN PELVIS W CONTRAST CLINICAL DATA:  38 year old female with abdominal pain.  EXAM: CT ABDOMEN AND PELVIS WITH CONTRAST  TECHNIQUE: Multidetector CT imaging of the abdomen and pelvis was performed using the standard protocol following bolus administration of intravenous contrast.  CONTRAST:  138mL OMNIPAQUE IOHEXOL 300 MG/ML  SOLN  COMPARISON:  Abdominal CT dated 06/19/2018  FINDINGS: Lower chest: The visualized lungs are clear. There is no cardiomegaly or pericardial effusion.  No intra-abdominal free air or free fluid.  Hepatobiliary: Diffuse fatty infiltration of the liver. No intrahepatic biliary ductal dilatation. The gallbladder is unremarkable.  Pancreas: Unremarkable. No pancreatic ductal dilatation or surrounding inflammatory changes.  Spleen: Normal in size without focal abnormality.  Adrenals/Urinary Tract: Adrenal glands are unremarkable. Kidneys are normal, without renal calculi, focal lesion, or hydronephrosis. Bladder is unremarkable.  Stomach/Bowel: There is a small hiatal hernia. There is no bowel obstruction. Mildly thickened jejunal loops may be related to underdistention although mild enteritis is not entirely excluded. Clinical correlation is recommended. Loose stool noted  within the colon and rectum compatible with diarrheal state. Clinical correlation is recommended. The appendix is normal.  Vascular/Lymphatic: The abdominal aorta and IVC appear unremarkable. No portal venous gas. There is no adenopathy. There is minimal haziness of the upper mesentery with multiple small lymph nodes with a "misty mesentery" appearance. This finding is nonspecific but may be related to underlying inflammatory/infectious etiology.  Reproductive: The uterus is anteverted. An intrauterine device is noted. The ovaries are unremarkable.  Other: None  Musculoskeletal: Bilateral L5 pars defects. No listhesis. No acute osseous pathology.  IMPRESSION: 1. Diarrheal state with findings of possible mild enteritis. Clinical correlation is recommended. No bowel obstruction. Normal appendix. 2. Fatty liver.  Electronically Signed   By: Anner Crete M.D.   On: 01/12/2019 00:36   Assessment and Plan:  See Problem List for Assessment and Plan of chronic medical problems.   Follow Up Instructions:    I discussed the assessment and treatment plan with the patient. The  patient was provided an opportunity to ask questions and all were answered. The patient agreed with the plan and demonstrated an understanding of the instructions.   The patient was advised to call back or seek an in-person evaluation if the symptoms worsen or if the condition fails to improve as anticipated.    Binnie Rail, MD

## 2019-01-24 ENCOUNTER — Encounter: Payer: Self-pay | Admitting: Internal Medicine

## 2019-01-24 ENCOUNTER — Ambulatory Visit (INDEPENDENT_AMBULATORY_CARE_PROVIDER_SITE_OTHER): Payer: 59 | Admitting: Internal Medicine

## 2019-01-24 DIAGNOSIS — N390 Urinary tract infection, site not specified: Secondary | ICD-10-CM | POA: Diagnosis not present

## 2019-01-24 DIAGNOSIS — Z8619 Personal history of other infectious and parasitic diseases: Secondary | ICD-10-CM

## 2019-01-24 DIAGNOSIS — A419 Sepsis, unspecified organism: Secondary | ICD-10-CM

## 2019-01-24 DIAGNOSIS — E1149 Type 2 diabetes mellitus with other diabetic neurological complication: Secondary | ICD-10-CM

## 2019-01-24 DIAGNOSIS — K529 Noninfective gastroenteritis and colitis, unspecified: Secondary | ICD-10-CM | POA: Diagnosis not present

## 2019-01-24 DIAGNOSIS — R112 Nausea with vomiting, unspecified: Secondary | ICD-10-CM

## 2019-01-24 NOTE — Assessment & Plan Note (Signed)
Viral in nature Improved with symptomatic treatment Stools soft-formed, but continuing to return to normal

## 2019-01-24 NOTE — Assessment & Plan Note (Signed)
Related to enteritis-viral in nature and UTI Resolved No longer needing Zofran Tolerating regular diet

## 2019-01-24 NOTE — Assessment & Plan Note (Signed)
Management per Dr. Cruzita Lederer Did not have DKA  Lab Results  Component Value Date   HGBA1C 9.0 (H) 10/20/2018   Sugars have been better controlled at home Continue current regimen and will follow up with endocrine as scheduled

## 2019-02-08 ENCOUNTER — Encounter: Payer: Self-pay | Admitting: Internal Medicine

## 2019-02-08 ENCOUNTER — Other Ambulatory Visit: Payer: Self-pay | Admitting: Internal Medicine

## 2019-02-08 MED ORDER — TRESIBA FLEXTOUCH 200 UNIT/ML ~~LOC~~ SOPN: 32.0000 [IU] | PEN_INJECTOR | Freq: Every day | SUBCUTANEOUS | 5 refills | Status: DC

## 2019-02-21 ENCOUNTER — Encounter: Payer: Self-pay | Admitting: Internal Medicine

## 2019-02-23 ENCOUNTER — Other Ambulatory Visit: Payer: Self-pay

## 2019-02-23 MED ORDER — LEVEMIR FLEXTOUCH 100 UNIT/ML ~~LOC~~ SOPN: 60.0000 [IU] | PEN_INJECTOR | Freq: Every day | SUBCUTANEOUS | 0 refills | Status: DC

## 2019-02-24 MED ORDER — GLIPIZIDE ER 5 MG PO TB24: 5.0000 mg | ORAL_TABLET | Freq: Every day | ORAL | 3 refills | Status: DC

## 2019-03-10 ENCOUNTER — Encounter: Payer: Self-pay | Admitting: Internal Medicine

## 2019-03-10 MED ORDER — TRIAMCINOLONE ACETONIDE 0.1 % EX CREA: 1.0000 "application " | TOPICAL_CREAM | Freq: Two times a day (BID) | CUTANEOUS | 1 refills | Status: AC | PRN

## 2019-03-10 MED ORDER — AMPHETAMINE-DEXTROAMPHET ER 20 MG PO CP24: 20.0000 mg | ORAL_CAPSULE | Freq: Every day | ORAL | 0 refills | Status: DC | PRN

## 2019-03-10 MED ORDER — ALPRAZOLAM 0.5 MG PO TABS: ORAL_TABLET | ORAL | 0 refills | Status: DC

## 2019-03-24 ENCOUNTER — Other Ambulatory Visit: Payer: Self-pay | Admitting: Internal Medicine

## 2019-03-25 DIAGNOSIS — B372 Candidiasis of skin and nail: Secondary | ICD-10-CM | POA: Diagnosis not present

## 2019-03-25 DIAGNOSIS — B373 Candidiasis of vulva and vagina: Secondary | ICD-10-CM | POA: Diagnosis not present

## 2019-03-25 DIAGNOSIS — N760 Acute vaginitis: Secondary | ICD-10-CM | POA: Diagnosis not present

## 2019-04-14 ENCOUNTER — Other Ambulatory Visit: Payer: Self-pay

## 2019-04-14 ENCOUNTER — Encounter: Payer: Self-pay | Admitting: Internal Medicine

## 2019-04-14 ENCOUNTER — Ambulatory Visit: Payer: 59 | Admitting: Internal Medicine

## 2019-04-14 VITALS — BP 118/80 | HR 102 | Ht 64.0 in | Wt 218.0 lb

## 2019-04-14 DIAGNOSIS — E1149 Type 2 diabetes mellitus with other diabetic neurological complication: Secondary | ICD-10-CM | POA: Diagnosis not present

## 2019-04-14 DIAGNOSIS — E785 Hyperlipidemia, unspecified: Secondary | ICD-10-CM

## 2019-04-14 DIAGNOSIS — E1169 Type 2 diabetes mellitus with other specified complication: Secondary | ICD-10-CM | POA: Insufficient documentation

## 2019-04-14 LAB — GLUCOSE, POCT (MANUAL RESULT ENTRY): POC Glucose: 232 mg/dl — AB (ref 70–99)

## 2019-04-14 LAB — POCT GLYCOSYLATED HEMOGLOBIN (HGB A1C): Hemoglobin A1C: 9.1 % — AB (ref 4.0–5.6)

## 2019-04-14 MED ORDER — NOVOLOG FLEXPEN 100 UNIT/ML ~~LOC~~ SOPN: 8.0000 [IU] | PEN_INJECTOR | Freq: Three times a day (TID) | SUBCUTANEOUS | 5 refills | Status: DC

## 2019-04-14 MED ORDER — BD PEN NEEDLE NANO U/F 32G X 4 MM MISC: 5 refills | Status: DC

## 2019-04-14 NOTE — Patient Instructions (Addendum)
Please continue: - Levemir 60 units at bedtime - Januvia 100 mg in am  Please move: - Metformin 2000 mg with dinner  Stop Glipizide.  Start: - NovoLog 8-10 units 15 min before the 3 main meals  Please return in 3-4 months with your sugar log.

## 2019-04-14 NOTE — Progress Notes (Signed)
Subjective:     Patient ID: Lisa Duran, female   DOB: [birth year], 38 y.o.   MRN: [id number]  HPI Lisa Duran is a pleasant 38 y.o. woman returning for f/u for DM2, insulin dependent, uncontrolled, dx [id number], w/o complications. Last visit 11 months ago.  She is not usually compliant with her visits.  At last visit, she was exercising and also started on Cymbalta.  She was feeling better on this.  Sugars were also better but they started to worsen afterwards. She had pneumonia at the beginning of the year and was also admitted with sepsis due to UTI in 01/11/2019.  She had DKA at that time.  She has problems tolerating insulins.  Basaglar and [place] caused eczema and also GI symptoms..  She is currently on Levemir, which she tolerates well, but sugars remain high.   She has been working from home since the beginning of the pandemic.  She goes for walks more frequently and feels she is doing a better job with her diet recently  Last HbA1c: Lab Results  Component Value Date   HGBA1C 9.0 (H) 10/20/2018   HGBA1C 7.2 (A) 05/27/2018   HGBA1C 9.4 (A) 01/22/2018   HGBA1C 6.2 05/25/2017   HGBA1C 7.6 12/25/2016   HGBA1C 9.5 08/19/2016   HGBA1C 8.6 (H) 12/20/2015   HGBA1C 8.2 11/22/2015   HGBA1C 7.7 (H) 06/29/2015   HGBA1C 9.8 03/22/2015   She is on:  - Lantus 32 >> Levemir 60 units at bedtime - Metformin 1000 mg 2x a day - Januvia 100 mg in am - Glipizide ER 5 mg before b'fast  We tried Cycloset 0.8 mg daily >> stopped end of 11/2016 b/c GERD/AP/C We tried Trulicity >> could not tolerate it: N/V/AP. She restarted Januvia 09/2016. She was on Invokana 100 mg in am (started 12/2014) >> 2 yeast inf >> tx with Diflucan; she was exhausted and had nocturia >> stopped 03/12/2015 Tried Victoza 2 mo ago >> AP, severe GERD, inj site rxn Tried Januvia >> tolerated it well, but stopped when Invokana was suggested.  She was also on Actos, but taken off b/c good control.  She has a [employer].  She checks sugars once a day: - am:78-161 >> 207-275 >> 101, 256 >> 140-280 - before lunch:74-126 >> n/c >> 106-131 >> n/c - 2h after lunch 222, 247 >> 110-114 >> n/c - before dinner: 96-126, 157 >> 120-180 (ave 160) - 2h post dinner: 119-207 >> n/c >> 82-148 >> n/c - b/t: 159-190 >> n/c >> 180 >> 140-180 She has hypoglycemia awareness in the 80s.  No CKD: Lab Results  Component Value Date   BUN <5 (L) 01/13/2019   Lab Results  Component Value Date   CREATININE 0.88 01/13/2019   No MAU: Lab Results  Component Value Date   MICRALBCREAT 0.8 10/20/2018   MICRALBCREAT 0.5 02/03/2017   MICRALBCREAT 0.7 12/20/2015   MICRALBCREAT 0.8 12/19/2014   MICRALBCREAT 0.2 01/19/2014   -+ HL: Lab Results  Component Value Date   CHOL 193 10/20/2018   HDL 65.40 10/20/2018   LDLCALC 108 (H) 10/20/2018   TRIG 96.0 10/20/2018   CHOLHDL 3 10/20/2018  She is not on a statin.  Last eye exam 01/2018: No DR- [place] [facility] in [facility]. + Numbness and tingling in hand and feet. On Cymbalta, Neurontin >> feeling much better.  She had CP >> had a stress test 07/04/2015 >>  Normal.  On metoprolol to control her heart rate.  She also has fibromyalgia.   She also has a history of iron deficiency anemia and had iron infusions.  She is also on B12 tablets. In 2019, she just got out of a 5-year relationship.  She was under a lot of stress.   Review of Systems Constitutional: no weight gain/no weight loss, no fatigue, no subjective hyperthermia, no subjective hypothermia Eyes: no blurry vision, no xerophthalmia ENT: no sore throat, no nodules palpated in neck, no dysphagia, no odynophagia, no hoarseness Cardiovascular: no CP/no SOB/no palpitations/no leg swelling Respiratory: no cough/no SOB/no wheezing Gastrointestinal: no N/no V/no D/no C/no acid reflux Musculoskeletal: no muscle aches/no joint aches Skin: no rashes, no hair loss Neurological: no tremors/+ numbness/+ tingling/no  dizziness  I reviewed pt's medications, allergies, PMH, social hx, family hx, and changes were documented in the history of present illness. Otherwise, unchanged from my initial visit note.  Past Medical History:  Diagnosis Date  . ADHD (attention deficit hyperactivity disorder)   . ALLERGIC RHINITIS   . Allergy   . Anemia   . ANXIETY   . Depression   . DIABETES MELLITUS, TYPE II   . Fibromyalgia 2018  . GERD (gastroesophageal reflux disease)   . MIGRAINE, COMMON   . Obesity, unspecified   . OVARIAN CYST    Past Surgical History:  Procedure Laterality Date  . NO PAST SURGERIES     Social History   Socioeconomic History  . Marital status: Single    Spouse name: Not on file  . Number of children: Not on file  . Years of education: Not on file  . Highest education level: Not on file  Occupational History  . Not on file  Social Needs  . Financial resource strain: Not on file  . Food insecurity    Worry: Not on file    Inability: Not on file  . Transportation needs    Medical: Not on file    Non-medical: Not on file  Tobacco Use  . Smoking status: Former Smoker    Quit date: 08/20/2002    Years since quitting: 16.6  . Smokeless tobacco: Never Used  . Tobacco comment: single, separated from spouse 02/2010.   Substance and Sexual Activity  . Alcohol use: Yes    Comment: rarely  . Drug use: No  . Sexual activity: Not Currently    Birth control/protection: I.U.D.  Lifestyle  . Physical activity    Days per week: Not on file    Minutes per session: Not on file  . Stress: Not on file  Relationships  . Social Herbalist on phone: Not on file    Gets together: Not on file    Attends religious service: Not on file    Active member of club or organization: Not on file    Attends meetings of clubs or organizations: Not on file    Relationship status: Not on file  . Intimate partner violence    Fear of current or ex partner: Not on file    Emotionally  abused: Not on file    Physically abused: Not on file    Forced sexual activity: Not on file  Other Topics Concern  . Not on file  Social History Narrative   Single, separated from spouse 02/2010   Current Outpatient Medications on File Prior to Visit  Medication Sig Dispense Refill  . almotriptan (AXERT) 12.5 MG tablet Take 1 tablet (12.5 mg total) by mouth as needed for migraine. may repeat in  2 hours if needed 12 tablet 3  . ALPRAZolam (XANAX) 0.5 MG tablet TAKE 1 TABLET(0.5 MG) BY MOUTH THREE TIMES DAILY AS NEEDED FOR ANXIETY 30 tablet 0  . amphetamine-dextroamphetamine (ADDERALL XR) 20 MG 24 hr capsule Take 1 capsule (20 mg total) by mouth daily as needed (school or work days). 30 capsule 0  . DULoxetine (CYMBALTA) 60 MG capsule Take 1 capsule (60 mg total) by mouth daily. 90 capsule 3  . fluticasone (FLONASE) 50 MCG/ACT nasal spray Place 2 sprays into both nostrils daily. 16 g 3  . gabapentin (NEURONTIN) 100 MG capsule Take 2 capsules (200 mg total) by mouth at bedtime. 180 capsule 1  . glipiZIDE (GLUCOTROL XL) 5 MG 24 hr tablet Take 1 tablet (5 mg total) by mouth daily with breakfast. 90 tablet 3  . glucose blood (ONETOUCH VERIO) test strip Use 2x a day 200 each 3  . Insulin Pen Needle (BD PEN NEEDLE NANO U/F) 32G X 4 MM MISC USE ONCE A DAY 100 each 3  . lamoTRIgine (LAMICTAL) 100 MG tablet TAKE 1 TABLET BY MOUTH TWO  TIMES DAILY (Patient taking differently: Take 100 mg by mouth daily. ) 180 tablet 1  . LEVEMIR FLEXTOUCH 100 UNIT/ML Pen ADMINISTER 60 UNITS UNDER THE SKIN DAILY 15 mL 0  . levonorgestrel (MIRENA) 20 MCG/24HR IUD 1 Intra Uterine Device (1 each total) by Intrauterine route once. 1 each 0  . metFORMIN (GLUCOPHAGE) 1000 MG tablet TAKE 1 TABLET BY  MOUTH TWICE A DAY WITH A  MEAL (Patient taking differently: Take 1,000 mg by mouth 2 (two) times daily with a meal. ) 180 tablet 3  . metoprolol succinate (TOPROL-XL) 25 MG 24 hr tablet Take 0.5 tablets (12.5 mg total) by mouth  daily. 45 tablet 1  . omeprazole (PRILOSEC) 40 MG capsule TAKE 1 CAPSULE BY MOUTH  DAILY BEFORE SUPPER (Patient taking differently: Take 40 mg by mouth daily before supper. ) 90 capsule 1  . Prenatal Vit-Fe Fumarate-FA (MULTIVITAMIN-PRENATAL) 27-0.8 MG TABS tablet Take 1 tablet by mouth every other day.     . sitaGLIPtin (JANUVIA) 100 MG tablet Take 1 tablet (100 mg total) by mouth daily. 90 tablet 3  . triamcinolone cream (KENALOG) 0.1 % Apply 1 application topically 2 (two) times daily as needed (for itching). 28.4 g 1   No current facility-administered medications on file prior to visit.    Allergies  Allergen Reactions  . Chicken Allergy Anaphylaxis and Shortness Of Breath  . Other Itching, Swelling and Other (See Comments)    NUTS (of ANY kind): Lips and mouth swell, but no breathing impairment & migraines and eczema are triggered  . Propofol Shortness Of Breath    SOB, chest tightness, wheezing after colonoscopy on 09-18-15  . Basaglar Kwikpen [Insulin Glargine] Palpitations and Hypertension  . Adhesive [Tape] Other (See Comments)    Makes the skin VERY RED   . Gluten Meal Diarrhea  . Peanut-Containing Drug Products Itching, Swelling and Other (See Comments)    Lips and mouth swell, but no breathing impairment & migraines and eczema are triggered   . Victoza [Liraglutide] Rash   Family History  Problem Relation Age of Onset  . Diabetes Mother   . Hyperlipidemia Mother   . Diabetes Father   . Hyperlipidemia Father   . Coronary artery disease Father   . Hypertension Father   . Cancer Father        esoph  . Stroke Father   . Cancer Maternal Grandfather  85       Prostate and Lung  . Diabetes Sister        Gestational  . Berenice Primas' disease Maternal Aunt   . Cancer Maternal Uncle        Brain  . Stroke Paternal Grandmother   . Heart disease Paternal Grandmother   . Stroke Paternal Grandfather   . Heart disease Paternal Grandfather   . Diabetes Sister        Gestational and  Type II  . Hashimoto's thyroiditis Cousin   . Cancer Maternal Uncle        Brain       Objective:   Physical Exam BP 118/80   Pulse (!) 102   Ht 5\' 4"  (1.626 m)   Wt 218 lb (98.9 kg)   SpO2 97%   BMI 37.42 kg/m   Wt Readings from Last 3 Encounters:  04/14/19 218 lb (98.9 kg)  01/13/19 224 lb (101.6 kg)  10/20/18 224 lb (101.6 kg)   Constitutional: overweight, in NAD Eyes: PERRLA, EOMI, no exophthalmos ENT: moist mucous membranes, no thyromegaly, no cervical lymphadenopathy Cardiovascular: RRR, No MRG Respiratory: CTA B Gastrointestinal: abdomen soft, NT, ND, BS+ Musculoskeletal: no deformities, strength intact in all 4 Skin: moist, warm, no rashes Neurological: no tremor with outstretched hands, DTR normal in all 4   Assessment:     1. DM2, insulin-dependent, uncontrolled, without complications - r/o autoimmunity Component     Latest Ref Rng 08/17/2012  Glutamic Acid Decarb Ab     <=1.0 U/mL <1.0  Pancreatic Islet Cell Antibody     < 5 JDF Units <5  C-Peptide     0.80 - 3.90 ng/mL 1.84  Glucose     70 - 99 mg/dL 104 (H)     2. Obesity class II BMI Classification:  < 18.5 underweight   18.5-24.9 normal weight   25.0-29.9 overweight   30.0-34.9 class I obesity   35.0-39.9 class II obesity   ? 40.0 class III obesity   3. HL  Plan:     1. Patient with longstanding, uncontrolled, type 2 diabetes, on complex medication regimen with p.o. meds and basal insulin.  She is not usually compliant with her appointments.  She canceled 2 appointments since last visit.  She had problems which hypoglycemia since last visit and could not tolerate several of the long-acting insulins.  Currently on Levemir  without side effects. -However, sugars remain elevated and especially fluctuating.  Her worst sugars are in the morning.  She does not feel throughout the day they are very high, however, a blood sugar checked at this visit was 232, fasting.  We discussed that we will  need to check her for type 1 diabetes but to check her insulin production her sugars need to be lower than 180.  I plan to do this at next visit. -For now, I advised her to move the entire dose of metformin with dinner to help more with morning sugars.  We will continue the Levemir at the same dose but may need to increase this in the future.  We will also stop glipizide and start mealtime insulin.  I advised her how to increase the dose but I also advised her that this can be used for hypoglycemia correction.  Unfortunately, we cannot use an SGLT 2 inhibitor or GLP-1 receptor agonist for her, since she had side effects from these in the past. - I suggested to:  Patient Instructions  Please continue: - Levemir 60 units  at bedtime - Januvia 100 mg in am  Please move: - Metformin 2000 mg with dinner  Stop Glipizide.  Start: - NovoLog 8-10 units 15 min before the 3 main meals  Please return in 3-4 months with your sugar log.   - we checked her HbA1c: 9.1% (higher)  - advised to check sugars at different times of the day - 3x a day, rotating check times - advised for yearly eye exams >> she is not UTD - return to clinic in 3-4 months    2. Obesity class II -Lost 11 pounds since last visit, possibly due to uncontrolled diabetes. -Unfortunately, we cannot use SGLT2 inhibitor or GLP-1 receptor agonist (due to previous side effects this would have helped with weight, also.  3. HK - Reviewed latest lipid panel: LDL above goal Lab Results  Component Value Date   CHOL 193 10/20/2018   HDL 65.40 10/20/2018   LDLCALC 108 (H) 10/20/2018   TRIG 96.0 10/20/2018   CHOLHDL 3 10/20/2018  -She is not on a statin. -We will need to recheck at next visit when diabetes is hopefully better controlled  Philemon Kingdom, MD PhD [facility]

## 2019-04-17 ENCOUNTER — Encounter: Payer: Self-pay | Admitting: Internal Medicine

## 2019-04-19 MED ORDER — ONDANSETRON HCL 4 MG PO TABS: 4.0000 mg | ORAL_TABLET | Freq: Three times a day (TID) | ORAL | 0 refills | Status: DC | PRN

## 2019-04-21 NOTE — Progress Notes (Signed)
Subjective:    Patient ID: Lisa Duran, female    DOB: 1981/06/16, 38 y.o.   MRN: [id number]  HPI The patient is here for follow up.  She is exercising regularly - walking regularly.    Diabetes: She follows with Dr. Cruzita Lederer.  She is taking her medication daily as prescribed. She was just placed novolog. She is feeling a little defeated by her poor sugar control and anxious about the new insulin causing side effects.  She is fairly compliant with a diabetic diet.   Migraine headaches: She takes Excedrin Migraine when needed and on occasion will take the axert.  She has about 1-2 a month. her medications are working well.     Fibromyalgia: She is taking Cymbalta and gabapentin daily.  She is exercising.  She feels her pain has increased some.  She has had increased knee and hip pain and fatigue.    GERD:  She is taking her medication daily as prescribed.  She denies any GERD symptoms and feels her GERD is well controlled.    ADD: She takes her medication during the week, but now on weekends.  She has medication is effective and denies any side effects including palpitations, headaches, lightheadedness and abnormal weight loss.  Anxiety, depression: She is taking her medication daily.  She denies side effects.  She feels her depression is well controlled.  Her anxiety is increased.  She is happy with her current dose of medication overall and does not think we need to make any changes.   Medications and allergies reviewed with patient and updated if appropriate.  Patient Active Problem List   Diagnosis Date Noted  . Dyslipidemia 04/14/2019  . Sepsis secondary to UTI ([place]) 01/12/2019  . Enteritis 01/12/2019  . Nausea vomiting and diarrhea 01/11/2019  . Cough 08/02/2018  . Strain of Achilles tendon, right, initial encounter 01/01/2018  . Sleep-related headache 04/06/2017  . Sleep walking disorder 04/06/2017  . Iron deficiency anemia 03/27/2017  . Arthralgia 02/03/2017  .  Fibromyalgia 02/03/2017  . GERD (gastroesophageal reflux disease) 06/24/2015  . Eczema 06/22/2015  . Depression 06/22/2015  . Tachycardia 06/16/2014  . Paresthesias 06/12/2014  . Food allergy   . Wrist pain, right 09/14/2013  . Elbow pain, right 09/05/2013  . ADHD (attention deficit hyperactivity disorder) 01/27/2011  . Anxiety 02/20/2010  . ALLERGIC RHINITIS 11/21/2009  . OVARIAN CYST 11/21/2009  . Diabetes mellitus type 2 with neurological manifestations ([place]) 11/20/2009  . Class 2 obesity in adult 11/20/2009  . Migraine without aura 11/20/2009    Current Outpatient Medications on File Prior to Visit  Medication Sig Dispense Refill  . almotriptan (AXERT) 12.5 MG tablet Take 1 tablet (12.5 mg total) by mouth as needed for migraine. may repeat in 2 hours if needed 12 tablet 3  . glucose blood (ONETOUCH VERIO) test strip Use 2x a day 200 each 3  . insulin aspart (NOVOLOG FLEXPEN) 100 UNIT/ML FlexPen Inject 8-10 Units into the skin 3 (three) times daily with meals. 15 mL 5  . Insulin Pen Needle (BD PEN NEEDLE NANO U/F) 32G X 4 MM MISC USE 4x A DAY 200 each 5  . levonorgestrel (MIRENA) 20 MCG/24HR IUD 1 Intra Uterine Device (1 each total) by Intrauterine route once. 1 each 0  . metFORMIN (GLUCOPHAGE) 1000 MG tablet TAKE 1 TABLET BY  MOUTH TWICE A DAY WITH A  MEAL (Patient taking differently: Take 1,000 mg by mouth 2 (two) times daily with a meal. ) 180 tablet  3  . ondansetron (ZOFRAN) 4 MG tablet Take 1 tablet (4 mg total) by mouth every 8 (eight) hours as needed for nausea or vomiting. 20 tablet 0  . Prenatal Vit-Fe Fumarate-FA (MULTIVITAMIN-PRENATAL) 27-0.8 MG TABS tablet Take 1 tablet by mouth every other day.     . sitaGLIPtin (JANUVIA) 100 MG tablet Take 1 tablet (100 mg total) by mouth daily. 90 tablet 3  . triamcinolone cream (KENALOG) 0.1 % Apply 1 application topically 2 (two) times daily as needed (for itching). 28.4 g 1   No current facility-administered medications on file  prior to visit.     Past Medical History:  Diagnosis Date  . ADHD (attention deficit hyperactivity disorder)   . ALLERGIC RHINITIS   . Allergy   . Anemia   . ANXIETY   . Depression   . DIABETES MELLITUS, TYPE II   . Fibromyalgia 2018  . GERD (gastroesophageal reflux disease)   . MIGRAINE, COMMON   . Obesity, unspecified   . OVARIAN CYST     Past Surgical History:  Procedure Laterality Date  . NO PAST SURGERIES      Social History   Socioeconomic History  . Marital status: Single    Spouse name: Not on file  . Number of children: Not on file  . Years of education: Not on file  . Highest education level: Not on file  Occupational History  . Not on file  Social Needs  . Financial resource strain: Not on file  . Food insecurity    Worry: Not on file    Inability: Not on file  . Transportation needs    Medical: Not on file    Non-medical: Not on file  Tobacco Use  . Smoking status: Former Smoker    Quit date: 08/20/2002    Years since quitting: 16.6  . Smokeless tobacco: Never Used  . Tobacco comment: single, separated from spouse 02/2010.   Substance and Sexual Activity  . Alcohol use: Yes    Comment: rarely  . Drug use: No  . Sexual activity: Not Currently    Birth control/protection: I.U.D.  Lifestyle  . Physical activity    Days per week: Not on file    Minutes per session: Not on file  . Stress: Not on file  Relationships  . Social Herbalist on phone: Not on file    Gets together: Not on file    Attends religious service: Not on file    Active member of club or organization: Not on file    Attends meetings of clubs or organizations: Not on file    Relationship status: Not on file  Other Topics Concern  . Not on file  Social History Narrative   Single, separated from spouse 02/2010    Family History  Problem Relation Age of Onset  . Diabetes Mother   . Hyperlipidemia Mother   . Diabetes Father   . Hyperlipidemia Father   . Coronary  artery disease Father   . Hypertension Father   . Cancer Father        esoph  . Stroke Father   . Cancer Maternal Grandfather 27       Prostate and Lung  . Diabetes Sister        Gestational  . Berenice Primas' disease Maternal Aunt   . Cancer Maternal Uncle        Brain  . Stroke Paternal Grandmother   . Heart disease Paternal Grandmother   .  Stroke Paternal Grandfather   . Heart disease Paternal Grandfather   . Diabetes Sister        Gestational and Type II  . Hashimoto's thyroiditis Cousin   . Cancer Maternal Uncle        Brain    Review of Systems  Constitutional: Negative for chills and fever.  Respiratory: Positive for chest tightness (anxiety related - felt like it was a corsett on) and shortness of breath (intermittent, intermittent - with exercise and rest - thinks it is anxiety). Negative for cough and wheezing.   Cardiovascular: Negative for chest pain, palpitations and leg swelling.  Musculoskeletal: Positive for arthralgias (knees, hips) and joint swelling (knees).  Neurological: Positive for headaches (migraines occ, occ tension). Negative for light-headedness.       Objective:   Vitals:   04/22/19 0827  BP: 112/74  Pulse: 94  Resp: 16  Temp: 99.2 F (37.3 C)  SpO2: 98%   BP Readings from Last 3 Encounters:  04/22/19 112/74  04/14/19 118/80  01/13/19 116/75   Wt Readings from Last 3 Encounters:  04/22/19 219 lb (99.3 kg)  04/14/19 218 lb (98.9 kg)  01/13/19 224 lb (101.6 kg)   Body mass index is 37.59 kg/m.   Physical Exam    Constitutional: Appears well-developed and well-nourished. No distress.  HENT:  Head: Normocephalic and atraumatic.  Neck: Neck supple. No tracheal deviation present. No thyromegaly present.  No cervical lymphadenopathy Cardiovascular: Normal rate, regular rhythm and normal heart sounds.   No murmur heard. No carotid bruit .  No edema Pulmonary/Chest: Effort normal and breath sounds normal. No respiratory distress. No has no  wheezes. No rales.  Skin: Skin is warm and dry. Not diaphoretic.  Psychiatric: mildly anxious mood and affect. Behavior is normal.      Assessment & Plan:    See Problem List for Assessment and Plan of chronic medical problems.

## 2019-04-21 NOTE — Patient Instructions (Addendum)
voltaren gel may help with your knee pain.    Have blood work done here in about 2 months.   Flu immunization administered today.     Medications reviewed and updated.  Changes include :  Start crestor 5 mg twice weekly when you feel like it is a good time to start    Your prescription(s) have been submitted to your pharmacy. Please take as directed and contact our office if you believe you are having problem(s) with the medication(s).   Please followup in 6  months

## 2019-04-22 ENCOUNTER — Ambulatory Visit (INDEPENDENT_AMBULATORY_CARE_PROVIDER_SITE_OTHER): Payer: 59 | Admitting: Internal Medicine

## 2019-04-22 ENCOUNTER — Other Ambulatory Visit: Payer: Self-pay

## 2019-04-22 ENCOUNTER — Encounter: Payer: Self-pay | Admitting: Internal Medicine

## 2019-04-22 VITALS — BP 112/74 | HR 94 | Temp 99.2°F | Resp 16 | Ht 64.0 in | Wt 219.0 lb

## 2019-04-22 DIAGNOSIS — F909 Attention-deficit hyperactivity disorder, unspecified type: Secondary | ICD-10-CM | POA: Diagnosis not present

## 2019-04-22 DIAGNOSIS — G43009 Migraine without aura, not intractable, without status migrainosus: Secondary | ICD-10-CM

## 2019-04-22 DIAGNOSIS — F32A Depression, unspecified: Secondary | ICD-10-CM

## 2019-04-22 DIAGNOSIS — E1149 Type 2 diabetes mellitus with other diabetic neurological complication: Secondary | ICD-10-CM

## 2019-04-22 DIAGNOSIS — R69 Illness, unspecified: Secondary | ICD-10-CM | POA: Diagnosis not present

## 2019-04-22 DIAGNOSIS — F329 Major depressive disorder, single episode, unspecified: Secondary | ICD-10-CM

## 2019-04-22 DIAGNOSIS — K219 Gastro-esophageal reflux disease without esophagitis: Secondary | ICD-10-CM | POA: Diagnosis not present

## 2019-04-22 DIAGNOSIS — M797 Fibromyalgia: Secondary | ICD-10-CM

## 2019-04-22 DIAGNOSIS — F419 Anxiety disorder, unspecified: Secondary | ICD-10-CM

## 2019-04-22 DIAGNOSIS — Z23 Encounter for immunization: Secondary | ICD-10-CM

## 2019-04-22 MED ORDER — ROSUVASTATIN CALCIUM 5 MG PO TABS: 5.0000 mg | ORAL_TABLET | ORAL | 3 refills | Status: DC

## 2019-04-22 MED ORDER — DULOXETINE HCL 60 MG PO CPEP: 60.0000 mg | ORAL_CAPSULE | Freq: Every day | ORAL | 1 refills | Status: DC

## 2019-04-22 MED ORDER — METOPROLOL SUCCINATE ER 25 MG PO TB24: 12.5000 mg | ORAL_TABLET | Freq: Every day | ORAL | 1 refills | Status: DC

## 2019-04-22 MED ORDER — AMPHETAMINE-DEXTROAMPHET ER 20 MG PO CP24: 20.0000 mg | ORAL_CAPSULE | Freq: Every day | ORAL | 0 refills | Status: DC | PRN

## 2019-04-22 MED ORDER — ALPRAZOLAM 0.5 MG PO TABS: ORAL_TABLET | ORAL | 0 refills | Status: DC

## 2019-04-22 MED ORDER — GABAPENTIN 100 MG PO CAPS: 200.0000 mg | ORAL_CAPSULE | Freq: Every day | ORAL | 1 refills | Status: DC

## 2019-04-22 MED ORDER — FLUTICASONE PROPIONATE 50 MCG/ACT NA SUSP: 2.0000 | Freq: Every day | NASAL | 3 refills | Status: DC

## 2019-04-22 MED ORDER — OMEPRAZOLE 40 MG PO CPDR: 40.0000 mg | DELAYED_RELEASE_CAPSULE | Freq: Every day | ORAL | 1 refills | Status: DC

## 2019-04-22 NOTE — Assessment & Plan Note (Signed)
Just started on novolog Has f/u with Dr Cruzita Lederer Will continue regular walking, will continue to work on weight and diet

## 2019-04-22 NOTE — Assessment & Plan Note (Signed)
GERD controlled Continue daily medication  

## 2019-04-22 NOTE — Assessment & Plan Note (Signed)
Has 1-2 a month overall controlled Continue excedrin and axert prn

## 2019-04-23 NOTE — Assessment & Plan Note (Signed)
Controlled, stable Continue current dose of medication  

## 2019-04-23 NOTE — Assessment & Plan Note (Addendum)
She is  takes medication during week only No side effects Medication is effective We will continue

## 2019-04-23 NOTE — Assessment & Plan Note (Signed)
Some increased pain in her hips and knees also increased fatigue This may also be influencing increased anxiety Continue Cymbalta and gabapentin Continue regular walking

## 2019-04-23 NOTE — Assessment & Plan Note (Signed)
Some increase in anxiety for several reasons, but overall feels her medication is helping and no adjustment is needed Discussed natural ways to help with some of her anxiety Continue current medications

## 2019-04-25 ENCOUNTER — Encounter: Payer: Self-pay | Admitting: Internal Medicine

## 2019-04-26 ENCOUNTER — Encounter: Payer: Self-pay | Admitting: Internal Medicine

## 2019-04-26 ENCOUNTER — Other Ambulatory Visit: Payer: Self-pay

## 2019-04-26 DIAGNOSIS — E1149 Type 2 diabetes mellitus with other diabetic neurological complication: Secondary | ICD-10-CM

## 2019-04-26 MED ORDER — INSULIN DETEMIR 100 UNIT/ML ~~LOC~~ SOLN: 60.0000 [IU] | Freq: Every day | SUBCUTANEOUS | 2 refills | Status: DC

## 2019-04-26 MED ORDER — METFORMIN HCL 1000 MG PO TABS: 2000.0000 mg | ORAL_TABLET | Freq: Every day | ORAL | 3 refills | Status: DC

## 2019-04-27 ENCOUNTER — Other Ambulatory Visit: Payer: Self-pay

## 2019-04-27 DIAGNOSIS — E1149 Type 2 diabetes mellitus with other diabetic neurological complication: Secondary | ICD-10-CM

## 2019-04-27 MED ORDER — BD PEN NEEDLE NANO U/F 32G X 4 MM MISC: 1.0000 | Freq: Four times a day (QID) | 5 refills | Status: DC

## 2019-04-28 ENCOUNTER — Other Ambulatory Visit: Payer: Self-pay

## 2019-04-28 MED ORDER — "INSULIN SYRINGE-NEEDLE U-100 25G X 5/8"" 1 ML MISC": 0 refills | Status: DC

## 2019-04-28 MED ORDER — LEVEMIR FLEXTOUCH 100 UNIT/ML ~~LOC~~ SOPN: 60.0000 [IU] | PEN_INJECTOR | Freq: Every day | SUBCUTANEOUS | 11 refills | Status: DC

## 2019-05-04 DIAGNOSIS — Z01419 Encounter for gynecological examination (general) (routine) without abnormal findings: Secondary | ICD-10-CM | POA: Diagnosis not present

## 2019-05-04 DIAGNOSIS — R69 Illness, unspecified: Secondary | ICD-10-CM | POA: Diagnosis not present

## 2019-05-04 DIAGNOSIS — Z1231 Encounter for screening mammogram for malignant neoplasm of breast: Secondary | ICD-10-CM | POA: Diagnosis not present

## 2019-05-04 DIAGNOSIS — Z6836 Body mass index (BMI) 36.0-36.9, adult: Secondary | ICD-10-CM | POA: Diagnosis not present

## 2019-05-06 ENCOUNTER — Encounter: Payer: Self-pay | Admitting: Internal Medicine

## 2019-05-06 MED ORDER — "BD INSULIN SYRINGE ULTRAFINE 30G X 1/2"" 0.5 ML MISC": 11 refills | Status: DC

## 2019-05-11 ENCOUNTER — Encounter: Payer: Self-pay | Admitting: Internal Medicine

## 2019-05-26 ENCOUNTER — Encounter: Payer: Self-pay | Admitting: Internal Medicine

## 2019-05-31 ENCOUNTER — Encounter: Payer: Self-pay | Admitting: Internal Medicine

## 2019-06-01 DIAGNOSIS — E119 Type 2 diabetes mellitus without complications: Secondary | ICD-10-CM | POA: Diagnosis not present

## 2019-06-01 LAB — HM DIABETES EYE EXAM

## 2019-06-01 MED ORDER — LEVEMIR FLEXTOUCH 100 UNIT/ML ~~LOC~~ SOPN: 60.0000 [IU] | PEN_INJECTOR | Freq: Every day | SUBCUTANEOUS | 11 refills | Status: DC

## 2019-06-07 ENCOUNTER — Encounter: Payer: Self-pay | Admitting: Internal Medicine

## 2019-06-08 MED ORDER — SITAGLIPTIN PHOSPHATE 100 MG PO TABS: 100.0000 mg | ORAL_TABLET | Freq: Every day | ORAL | 3 refills | Status: DC

## 2019-06-16 NOTE — Progress Notes (Signed)
Subjective:    Patient ID: Lisa Duran, female    DOB: June 05, 1981, 38 y.o.   MRN: [id number]  HPI The patient is here for an acute visit.   GI issues:  For the past 6 weeks or so she has had episodes that include nausea, vomiting, abdominal pain and back pain.  Her most recent episode was 2 days ago.  The episode prior to that was last week.  Her episode last Wednesday started with nausea when she woke up in the morning.  She ate breakfast and started throwing up multiple times.  She had generalized abdominal pain and then developed lower chest pain and back pain.  Her symptoms would last for hours and did not resolve until that night.  Most of the time she does have a small amount of diarrhea before the symptoms started.    Her symptoms are not related to any specific food.  She has had associated GERD and increased burping.  A while ago she did decrease the omeprazole and is currently taking it every other day.  In between these episodes she feels completely fine.   She has not had any fevers.  She denies blood in stool.  Medications and allergies reviewed with patient and updated if appropriate.  Patient Active Problem List   Diagnosis Date Noted  . Dyslipidemia 04/14/2019  . Sepsis secondary to UTI ([place]) 01/12/2019  . Enteritis 01/12/2019  . Nausea vomiting and diarrhea 01/11/2019  . Cough 08/02/2018  . Strain of Achilles tendon, right, initial encounter 01/01/2018  . Sleep-related headache 04/06/2017  . Sleep walking disorder 04/06/2017  . Iron deficiency anemia 03/27/2017  . Arthralgia 02/03/2017  . Fibromyalgia 02/03/2017  . GERD (gastroesophageal reflux disease) 06/24/2015  . Eczema 06/22/2015  . Depression 06/22/2015  . Tachycardia 06/16/2014  . Paresthesias 06/12/2014  . Food allergy   . Wrist pain, right 09/14/2013  . Elbow pain, right 09/05/2013  . ADHD (attention deficit hyperactivity disorder) 01/27/2011  . Anxiety 02/20/2010  . ALLERGIC RHINITIS 11/21/2009   . OVARIAN CYST 11/21/2009  . Diabetes mellitus type 2 with neurological manifestations ([place]) 11/20/2009  . Class 2 obesity in adult 11/20/2009  . Migraine without aura 11/20/2009    Current Outpatient Medications on File Prior to Visit  Medication Sig Dispense Refill  . almotriptan (AXERT) 12.5 MG tablet Take 1 tablet (12.5 mg total) by mouth as needed for migraine. may repeat in 2 hours if needed 12 tablet 3  . ALPRAZolam (XANAX) 0.5 MG tablet TAKE 1 TABLET(0.5 MG) BY MOUTH THREE TIMES DAILY AS NEEDED FOR ANXIETY 30 tablet 0  . amphetamine-dextroamphetamine (ADDERALL XR) 20 MG 24 hr capsule Take 1 capsule (20 mg total) by mouth daily as needed (school or work days). 30 capsule 0  . DULoxetine (CYMBALTA) 60 MG capsule Take 1 capsule (60 mg total) by mouth daily. 90 capsule 1  . gabapentin (NEURONTIN) 100 MG capsule Take 2 capsules (200 mg total) by mouth at bedtime. 180 capsule 1  . glucose blood (ONETOUCH VERIO) test strip Use 2x a day 200 each 3  . insulin aspart (NOVOLOG FLEXPEN) 100 UNIT/ML FlexPen Inject 8-10 Units into the skin 3 (three) times daily with meals. 15 mL 5  . Insulin Detemir (LEVEMIR FLEXTOUCH) 100 UNIT/ML Pen Inject 60 Units into the skin at bedtime. 20 pen 11  . Insulin Pen Needle (BD PEN NEEDLE NANO U/F) 32G X 4 MM MISC 1 each by Other route 4 (four) times daily. USE 4x A DAY  200 each 5  . Insulin Syringe-Needle U-100 (B-D INS SYR ULTRAFINE .5CC/30G) 30G X 1/2" 0.5 ML MISC Use to inject insulin once daily. 100 each 11  . levonorgestrel (MIRENA) 20 MCG/24HR IUD 1 Intra Uterine Device (1 each total) by Intrauterine route once. 1 each 0  . metFORMIN (GLUCOPHAGE) 1000 MG tablet Take 2 tablets (2,000 mg total) by mouth daily with supper. TAKE 1 TABLET BY  MOUTH TWICE A DAY WITH A  MEAL 180 tablet 3  . metoprolol succinate (TOPROL-XL) 25 MG 24 hr tablet Take 0.5 tablets (12.5 mg total) by mouth daily. 45 tablet 1  . omeprazole (PRILOSEC) 40 MG capsule Take 1 capsule (40 mg  total) by mouth daily before supper. 90 capsule 1  . ondansetron (ZOFRAN) 4 MG tablet Take 1 tablet (4 mg total) by mouth every 8 (eight) hours as needed for nausea or vomiting. 20 tablet 0  . Prenatal Vit-Fe Fumarate-FA (MULTIVITAMIN-PRENATAL) 27-0.8 MG TABS tablet Take 1 tablet by mouth every other day.     . rosuvastatin (CRESTOR) 5 MG tablet Take 1 tablet (5 mg total) by mouth 2 (two) times a week. 13 tablet 3  . sitaGLIPtin (JANUVIA) 100 MG tablet Take 1 tablet (100 mg total) by mouth daily. 90 tablet 3  . triamcinolone cream (KENALOG) 0.1 % Apply 1 application topically 2 (two) times daily as needed (for itching). 28.4 g 1  . fluticasone (FLONASE) 50 MCG/ACT nasal spray Place 2 sprays into both nostrils daily. (Patient not taking: Reported on 06/17/2019) 16 g 3   No current facility-administered medications on file prior to visit.     Past Medical History:  Diagnosis Date  . ADHD (attention deficit hyperactivity disorder)   . ALLERGIC RHINITIS   . Allergy   . Anemia   . ANXIETY   . Depression   . DIABETES MELLITUS, TYPE II   . Fibromyalgia 2018  . GERD (gastroesophageal reflux disease)   . MIGRAINE, COMMON   . Obesity, unspecified   . OVARIAN CYST     Past Surgical History:  Procedure Laterality Date  . NO PAST SURGERIES      Social History   Socioeconomic History  . Marital status: Single    Spouse name: Not on file  . Number of children: Not on file  . Years of education: Not on file  . Highest education level: Not on file  Occupational History  . Not on file  Social Needs  . Financial resource strain: Not on file  . Food insecurity    Worry: Not on file    Inability: Not on file  . Transportation needs    Medical: Not on file    Non-medical: Not on file  Tobacco Use  . Smoking status: Former Smoker    Quit date: 08/20/2002    Years since quitting: 16.8  . Smokeless tobacco: Never Used  . Tobacco comment: single, separated from spouse 02/2010.   Substance  and Sexual Activity  . Alcohol use: Yes    Comment: rarely  . Drug use: No  . Sexual activity: Not Currently    Birth control/protection: I.U.D.  Lifestyle  . Physical activity    Days per week: Not on file    Minutes per session: Not on file  . Stress: Not on file  Relationships  . Social Herbalist on phone: Not on file    Gets together: Not on file    Attends religious service: Not on file  Active member of club or organization: Not on file    Attends meetings of clubs or organizations: Not on file    Relationship status: Not on file  Other Topics Concern  . Not on file  Social History Narrative   Single, separated from spouse 02/2010    Family History  Problem Relation Age of Onset  . Diabetes Mother   . Hyperlipidemia Mother   . Diabetes Father   . Hyperlipidemia Father   . Coronary artery disease Father   . Hypertension Father   . Cancer Father        esoph  . Stroke Father   . Cancer Maternal Grandfather 49       Prostate and Lung  . Diabetes Sister        Gestational  . Berenice Primas' disease Maternal Aunt   . Cancer Maternal Uncle        Brain  . Stroke Paternal Grandmother   . Heart disease Paternal Grandmother   . Stroke Paternal Grandfather   . Heart disease Paternal Grandfather   . Diabetes Sister        Gestational and Type II  . Hashimoto's thyroiditis Cousin   . Cancer Maternal Uncle        Brain    Review of Systems  Constitutional: Positive for chills. Negative for fever.  Gastrointestinal: Positive for abdominal pain (generalized initially and then lower chest to back), diarrhea, nausea and vomiting. Negative for blood in stool.  Neurological: Positive for headaches. Negative for numbness.       Objective:   Vitals:   06/17/19 1522  BP: 118/80  Pulse: (!) 126  Temp: 99.1 F (37.3 C)  SpO2: 99%   BP Readings from Last 3 Encounters:  06/17/19 118/80  04/22/19 112/74  04/14/19 118/80   Wt Readings from Last 3 Encounters:   06/17/19 217 lb (98.4 kg)  04/22/19 219 lb (99.3 kg)  04/14/19 218 lb (98.9 kg)   Body mass index is 37.25 kg/m.   Physical Exam Constitutional:      General: She is not in acute distress.    Appearance: Normal appearance. She is not ill-appearing.  HENT:     Head: Normocephalic and atraumatic.  Abdominal:     General: Bowel sounds are normal. There is no distension.     Palpations: Abdomen is soft.     Tenderness: There is no abdominal tenderness. There is no guarding or rebound.  Skin:    General: Skin is warm and dry.  Neurological:     Mental Status: She is alert.            Assessment & Plan:    See Problem List for Assessment and Plan of chronic medical problems.

## 2019-06-17 ENCOUNTER — Other Ambulatory Visit: Payer: Self-pay

## 2019-06-17 ENCOUNTER — Ambulatory Visit (INDEPENDENT_AMBULATORY_CARE_PROVIDER_SITE_OTHER): Payer: 59 | Admitting: Internal Medicine

## 2019-06-17 ENCOUNTER — Encounter: Payer: Self-pay | Admitting: Internal Medicine

## 2019-06-17 VITALS — BP 118/80 | HR 126 | Temp 99.1°F | Ht 64.0 in | Wt 217.0 lb

## 2019-06-17 DIAGNOSIS — R1013 Epigastric pain: Secondary | ICD-10-CM | POA: Diagnosis not present

## 2019-06-17 MED ORDER — ONDANSETRON 4 MG PO TBDP: 4.0000 mg | ORAL_TABLET | Freq: Three times a day (TID) | ORAL | 0 refills | Status: DC | PRN

## 2019-06-17 NOTE — Assessment & Plan Note (Signed)
Intermittent epigastric pain with nausea, vomiting, some diarrhea, lower chest pain and back pain Has had some associated GERD and burping ?  Very atypical GERD versus gallbladder disease Increase omeprazole 40 mg to daily Zofran as needed Abdominal ultrasound ordered She will update me on how she is doing next week

## 2019-06-17 NOTE — Patient Instructions (Signed)
  Medications reviewed and updated.  Changes include :   Increase omeprazole to daily.  Use the zofran as needed.   Your prescription(s) have been submitted to your pharmacy. Please take as directed and contact our office if you believe you are having problem(s) with the medication(s).  An [place] of your abdomen was ordered.  Someone will call you to schedule this.

## 2019-06-23 ENCOUNTER — Encounter: Payer: Self-pay | Admitting: Internal Medicine

## 2019-06-23 ENCOUNTER — Ambulatory Visit
Admission: RE | Admit: 2019-06-23 | Discharge: 2019-06-23 | Disposition: A | Discharge status: Home / Self Care | Payer: 59 | Source: Ambulatory Visit | Attending: Internal Medicine | Admitting: Internal Medicine

## 2019-06-23 DIAGNOSIS — K760 Fatty (change of) liver, not elsewhere classified: Secondary | ICD-10-CM | POA: Diagnosis not present

## 2019-06-23 DIAGNOSIS — R1013 Epigastric pain: Secondary | ICD-10-CM

## 2019-07-10 ENCOUNTER — Encounter: Payer: Self-pay | Admitting: Internal Medicine

## 2019-07-11 MED ORDER — AMPHETAMINE-DEXTROAMPHET ER 20 MG PO CP24: 20.0000 mg | ORAL_CAPSULE | Freq: Every day | ORAL | 0 refills | Status: DC | PRN

## 2019-07-11 MED ORDER — ALPRAZOLAM 0.5 MG PO TABS: ORAL_TABLET | ORAL | 0 refills | Status: DC

## 2019-07-14 ENCOUNTER — Ambulatory Visit: Payer: 59 | Admitting: Internal Medicine

## 2019-08-08 ENCOUNTER — Other Ambulatory Visit: Payer: Self-pay

## 2019-08-08 ENCOUNTER — Encounter: Payer: Self-pay | Admitting: Internal Medicine

## 2019-08-08 ENCOUNTER — Ambulatory Visit: Payer: 59 | Admitting: Internal Medicine

## 2019-08-08 VITALS — BP 122/88 | HR 107 | Ht 64.0 in | Wt 222.0 lb

## 2019-08-08 DIAGNOSIS — Z6838 Body mass index (BMI) 38.0-38.9, adult: Secondary | ICD-10-CM

## 2019-08-08 DIAGNOSIS — E1149 Type 2 diabetes mellitus with other diabetic neurological complication: Secondary | ICD-10-CM

## 2019-08-08 DIAGNOSIS — E785 Hyperlipidemia, unspecified: Secondary | ICD-10-CM

## 2019-08-08 MED ORDER — INSULIN REGULAR HUMAN 100 UNIT/ML IJ SOLN: 10.0000 [IU] | Freq: Three times a day (TID) | INTRAMUSCULAR | 11 refills | Status: DC

## 2019-08-08 NOTE — Progress Notes (Signed)
Subjective:     Patient ID: Lisa Duran, female   DOB: May 10, 1981, 38 y.o.   MRN: [id number]  This visit occurred during the SARS-CoV-2 public health emergency.  Safety protocols were in place, including screening questions prior to the visit, additional usage of staff PPE, and extensive cleaning of exam room while observing appropriate contact time as indicated for disinfecting solutions.   HPI Lisa Duran is a pleasant 38 y.o. woman returning for f/u for DM2, insulin dependent, uncontrolled, dx [id number], w/o complications. Last visit for months ago.  Since the start of the COVID-19 pandemic, she did a better job going for walks and improving her diet.  She is working from home.  She has a rash developed at the sides of her NovoLog injections - she brings pictures.  Reviewed her HbA1c levels: Lab Results  Component Value Date   HGBA1C 9.1 (A) 04/14/2019   HGBA1C 9.0 (H) 10/20/2018   HGBA1C 7.2 (A) 05/27/2018   HGBA1C 9.4 (A) 01/22/2018   HGBA1C 6.2 05/25/2017   HGBA1C 7.6 12/25/2016   HGBA1C 9.5 08/19/2016   HGBA1C 8.6 (H) 12/20/2015   HGBA1C 8.2 11/22/2015   HGBA1C 7.7 (H) 06/29/2015   She is on:  - Lantus 32 >> Levemir 60 units at bedtime - Metformin 1000 mg 2x a day >> 2000 mg with dinner or at bedtime - Januvia 100 mg in am - NovoLog 8-10 units 3 times a day before meals We stopped glipizide ER at last visit We tried Cycloset 0.8 mg daily >> stopped end of 11/2016 b/c GERD/AP/C We tried Trulicity >> could not tolerate it: N/V/AP. She restarted Januvia 09/2016. She was on Invokana 100 mg in am (started 12/2014) >> 2 yeast inf >> tx with Diflucan; she was exhausted and had nocturia >> stopped 03/12/2015 Tried Victoza 2 mo ago >> AP, severe GERD, inj site rxn Tried Januvia >> tolerated it well, but stopped when Invokana was suggested.  She was also on Actos, but taken off b/c good control. She has problems tolerating insulins.  Basaglar and [place] caused eczema and also GI  symptoms..  She is currently on Levemir, which she tolerates well, but sugars remain high.   She has a [employer], structural.  She checks sugars once a day per meter download: - am:78-161 >> 207-275 >> 101, 256 >> 140-280 >> 118, 152-169, 253 - before lunch:74-126 >> n/c >> 106-131 >> n/c >> 152, 184, 229 - 2h after lunch 222, 247 >> 110-114 >> n/c - before dinner: 96-126, 157 >> 120-180 (ave 160) >> n/c - 2h post dinner: 119-207 >> n/c >> 82-148 >> n/c - b/t: 159-190 >> n/c >> 180 >> 140-180 >> 214 She has hypoglycemia awareness in the 80s.  No CKD: Lab Results  Component Value Date   BUN <5 (L) 01/13/2019   Lab Results  Component Value Date   CREATININE 0.88 01/13/2019   No MAU: Lab Results  Component Value Date   MICRALBCREAT 0.8 10/20/2018   MICRALBCREAT 0.5 02/03/2017   MICRALBCREAT 0.7 12/20/2015   MICRALBCREAT 0.8 12/19/2014   MICRALBCREAT 0.2 01/19/2014   -+ HL: Lab Results  Component Value Date   CHOL 193 10/20/2018   HDL 65.40 10/20/2018   LDLCALC 108 (H) 10/20/2018   TRIG 96.0 10/20/2018   CHOLHDL 3 10/20/2018  She is not on a statin.  Last eye exam 05/2019: No DR retinopathy - [facility]. She has numbness and tingling in hand and feet. On  Cymbalta, Neurontin >> feeling much better.  She had CP >> had a stress test 07/04/2015 >>  Normal.  On metoprolol to control her heart rate. She also has fibromyalgia.   She also has a history of iron deficiency anemia and had iron infusions.  She is also on B12 tablets. In 2019, she just got out of a 5-year relationship.  She was under a lot of stress.   On Cymbalta for anxiety  - feels better on this.  Review of Systems Constitutional: no weight gain/no weight loss, no fatigue, no subjective hyperthermia, no subjective hypothermia Eyes: no blurry vision, no xerophthalmia ENT: no sore throat, no nodules palpated in neck, no dysphagia, no odynophagia, no hoarseness Cardiovascular: no CP/no  SOB/no palpitations/no leg swelling Respiratory: no cough/no SOB/no wheezing Gastrointestinal: no N/no V/no D/no C/no acid reflux Musculoskeletal: no muscle aches/no joint aches Skin: no rashes, no hair loss Neurological: no tremors/+ numbness/+ tingling/no dizziness  I reviewed pt's medications, allergies, PMH, social hx, family hx, and changes were documented in the history of present illness. Otherwise, unchanged from my initial visit note.  Past Medical History:  Diagnosis Date  . ADHD (attention deficit hyperactivity disorder)   . ALLERGIC RHINITIS   . Allergy   . Anemia   . ANXIETY   . Depression   . DIABETES MELLITUS, TYPE II   . Fibromyalgia 2018  . GERD (gastroesophageal reflux disease)   . MIGRAINE, COMMON   . Obesity, unspecified   . OVARIAN CYST    Past Surgical History:  Procedure Laterality Date  . NO PAST SURGERIES     Social History   Socioeconomic History  . Marital status: Single    Spouse name: Not on file  . Number of children: Not on file  . Years of education: Not on file  . Highest education level: Not on file  Occupational History  . Not on file  Tobacco Use  . Smoking status: Former Smoker    Quit date: 08/20/2002    Years since quitting: 16.9  . Smokeless tobacco: Never Used  . Tobacco comment: single, separated from spouse 02/2010.   Substance and Sexual Activity  . Alcohol use: Yes    Comment: rarely  . Drug use: No  . Sexual activity: Not Currently    Birth control/protection: I.U.D.  Other Topics Concern  . Not on file  Social History Narrative   Single, separated from spouse 02/2010   Social Determinants of Health   Financial Resource Strain:   . Difficulty of Paying Living Expenses: Not on file  Food Insecurity:   . Worried About Charity fundraiser in the Last Year: Not on file  . Ran Out of Food in the Last Year: Not on file  Transportation Needs:   . Lack of Transportation (Medical): Not on file  . Lack of Transportation  (Non-Medical): Not on file  Physical Activity:   . Days of Exercise per Week: Not on file  . Minutes of Exercise per Session: Not on file  Stress:   . Feeling of Stress : Not on file  Social Connections:   . Frequency of Communication with Friends and Family: Not on file  . Frequency of Social Gatherings with Friends and Family: Not on file  . Attends Religious Services: Not on file  . Active Member of Clubs or Organizations: Not on file  . Attends Archivist Meetings: Not on file  . Marital Status: Not on file  Intimate Partner Violence:   .  Fear of Current or Ex-Partner: Not on file  . Emotionally Abused: Not on file  . Physically Abused: Not on file  . Sexually Abused: Not on file   Current Outpatient Medications on File Prior to Visit  Medication Sig Dispense Refill  . almotriptan (AXERT) 12.5 MG tablet Take 1 tablet (12.5 mg total) by mouth as needed for migraine. may repeat in 2 hours if needed 12 tablet 3  . ALPRAZolam (XANAX) 0.5 MG tablet TAKE 1 TABLET(0.5 MG) BY MOUTH THREE TIMES DAILY AS NEEDED FOR ANXIETY 30 tablet 0  . amphetamine-dextroamphetamine (ADDERALL XR) 20 MG 24 hr capsule Take 1 capsule (20 mg total) by mouth daily as needed (school or work days). 30 capsule 0  . DULoxetine (CYMBALTA) 60 MG capsule Take 1 capsule (60 mg total) by mouth daily. 90 capsule 1  . gabapentin (NEURONTIN) 100 MG capsule Take 2 capsules (200 mg total) by mouth at bedtime. 180 capsule 1  . glucose blood (ONETOUCH VERIO) test strip Use 2x a day 200 each 3  . insulin aspart (NOVOLOG FLEXPEN) 100 UNIT/ML FlexPen Inject 8-10 Units into the skin 3 (three) times daily with meals. 15 mL 5  . Insulin Detemir (LEVEMIR FLEXTOUCH) 100 UNIT/ML Pen Inject 60 Units into the skin at bedtime. 20 pen 11  . Insulin Pen Needle (BD PEN NEEDLE NANO U/F) 32G X 4 MM MISC 1 each by Other route 4 (four) times daily. USE 4x A DAY 200 each 5  . Insulin Syringe-Needle U-100 (B-D INS SYR ULTRAFINE .5CC/30G)  30G X 1/2" 0.5 ML MISC Use to inject insulin once daily. 100 each 11  . levonorgestrel (MIRENA) 20 MCG/24HR IUD 1 Intra Uterine Device (1 each total) by Intrauterine route once. 1 each 0  . metFORMIN (GLUCOPHAGE) 1000 MG tablet Take 2 tablets (2,000 mg total) by mouth daily with supper. TAKE 1 TABLET BY  MOUTH TWICE A DAY WITH A  MEAL 180 tablet 3  . metoprolol succinate (TOPROL-XL) 25 MG 24 hr tablet Take 0.5 tablets (12.5 mg total) by mouth daily. 45 tablet 1  . omeprazole (PRILOSEC) 40 MG capsule Take 1 capsule (40 mg total) by mouth daily before supper. 90 capsule 1  . ondansetron (ZOFRAN ODT) 4 MG disintegrating tablet Take 1 tablet (4 mg total) by mouth every 8 (eight) hours as needed for nausea or vomiting. 20 tablet 0  . ondansetron (ZOFRAN) 4 MG tablet Take 1 tablet (4 mg total) by mouth every 8 (eight) hours as needed for nausea or vomiting. 20 tablet 0  . Prenatal Vit-Fe Fumarate-FA (MULTIVITAMIN-PRENATAL) 27-0.8 MG TABS tablet Take 1 tablet by mouth every other day.     . rosuvastatin (CRESTOR) 5 MG tablet Take 1 tablet (5 mg total) by mouth 2 (two) times a week. 13 tablet 3  . sitaGLIPtin (JANUVIA) 100 MG tablet Take 1 tablet (100 mg total) by mouth daily. 90 tablet 3  . triamcinolone cream (KENALOG) 0.1 % Apply 1 application topically 2 (two) times daily as needed (for itching). 28.4 g 1   No current facility-administered medications on file prior to visit.   Allergies  Allergen Reactions  . Chicken Allergy Anaphylaxis and Shortness Of Breath  . Other Itching, Swelling and Other (See Comments)    NUTS (of ANY kind): Lips and mouth swell, but no breathing impairment & migraines and eczema are triggered  . Propofol Shortness Of Breath    SOB, chest tightness, wheezing after colonoscopy on 09-18-15  . Basaglar [place] Rigg [Insulin Glargine] Palpitations  and Hypertension  . Adhesive [Tape] Other (See Comments)    Makes the skin VERY RED   . Gluten Meal Diarrhea  . Peanut-Containing Drug  Products Itching, Swelling and Other (See Comments)    Lips and mouth swell, but no breathing impairment & migraines and eczema are triggered   . Tyler Aas [Insulin Degludec] Rash    eczema  . Victoza [Liraglutide] Rash   Family History  Problem Relation Age of Onset  . Diabetes Mother   . Hyperlipidemia Mother   . Diabetes Father   . Hyperlipidemia Father   . Coronary artery disease Father   . Hypertension Father   . Cancer Father        esoph  . Stroke Father   . Cancer Maternal Grandfather 74       Prostate and Lung  . Diabetes Sister        Gestational  . Berenice Primas' disease Maternal Aunt   . Cancer Maternal Uncle        Brain  . Stroke Paternal Grandmother   . Heart disease Paternal Grandmother   . Stroke Paternal Grandfather   . Heart disease Paternal Grandfather   . Diabetes Sister        Gestational and Type II  . Hashimoto's thyroiditis Cousin   . Cancer Maternal Uncle        Brain       Objective:   Physical Exam BP 122/88   Pulse (!) 107   Ht 5\' 4"  (1.626 m)   Wt 222 lb (100.7 kg)   SpO2 98%   BMI 38.11 kg/m   Wt Readings from Last 3 Encounters:  08/08/19 222 lb (100.7 kg)  06/17/19 217 lb (98.4 kg)  04/22/19 219 lb (99.3 kg)   Constitutional: overweight, in NAD Eyes: PERRLA, EOMI, no exophthalmos ENT: moist mucous membranes, no thyromegaly, no cervical lymphadenopathy Cardiovascular: tachycardia, RR, No MRG Respiratory: CTA B Gastrointestinal: abdomen soft, NT, ND, BS+ Musculoskeletal: no deformities, strength intact in all 4 Skin: moist, warm, no rashes Neurological: no tremor with outstretched hands, DTR normal in all 4  Assessment:     1. DM2, insulin-dependent, uncontrolled, without complications - r/o autoimmunity Component     Latest Ref Rng 08/17/2012  Glutamic Acid Decarb Ab     <=1.0 U/mL <1.0  Pancreatic Islet Cell Antibody     < 5 JDF Units <5  C-Peptide     0.80 - 3.90 ng/mL 1.84  Glucose     70 - 99 mg/dL 104 (H)     2.  Obesity class II BMI Classification:  < 18.5 underweight   18.5-24.9 normal weight   25.0-29.9 overweight   30.0-34.9 class I obesity   35.0-39.9 class II obesity   ? 40.0 class III obesity   3. HL  Plan:     1. Patient with longstanding, uncontrolled, type 2 diabetes, on a complex medication regimen with p.o. medication and basal insulin.  At last visit, we added NovoLog as sugars were very elevated and especially fluctuating.  We also moved Metformin at dinnertime.  At that time we discussed about the need to recheck her insulin production.  We will do this at next visit. -Unfortunately, we cannot use SGLT2 inhibitors and GLP-1 receptor agonists due to previous intolerance -At this visit, sugars improved, but she is not taking consistently and they are still fluctuating.  She is trying to inject her NovoLog before every meal but does not always check the sugars before the meals.  She is working on improving this.  However, she develops a rash every time she injects NovoLog and at this visit, I advised her to switch to regular insulin vials.  She agrees to try this.  In the future, we may try Humalog since this comes in a pen, if covered by insurance -Since the sugars are still high, I did suggest to increase NovoLog doses but at this point I do not feel the Januvia is helping much, so we will stop it.  We will keep the Metformin and Levemir dose the same. - I suggested to:  Patient Instructions  Please continue: - Metformin 2000 mg with dinner - Levemir 60 units at bedtime  Stop: - Januvia  Change Novolog to: - ReliOn Regular insulin 10-16 units 15 min before the 3 main meals.  Please return in 3-4 months with your sugar log.   - we checked her HbA1c: 8.4% (better) - advised to check sugars at different times of the day - 3x a day, rotating check times - advised for yearly eye exams >> she is UTD - return to clinic in 3-4 months    2. Obesity class II -Before last visit,  she lost 11 pounds, possibly due to uncontrolled diabetes.  Since then, she gained 3 pounds. -Unfortunately, we cannot use SGLT2 inhibitor and GLP-1 receptor agonist which would have helped with weight loss, due to previous intolerance  3. HL -Reviewed latest lipid panel: LDL higher than goal Lab Results  Component Value Date   CHOL 193 10/20/2018   HDL 65.40 10/20/2018   LDLCALC 108 (H) 10/20/2018   TRIG 96.0 10/20/2018   CHOLHDL 3 10/20/2018  -She is not on a statin -She is due for another lipid panel at next visit  Philemon Kingdom, MD PhD [facility] Endocrinology

## 2019-08-08 NOTE — Patient Instructions (Addendum)
Please continue: - Metformin 2000 mg with dinner - Levemir 60 units at bedtime  Stop: - Januvia  Change Novolog to: - ReliOn Regular insulin 10-16 units 15 min before the 3 main meals.  Please return in 3-4 months with your sugar log.

## 2019-08-09 LAB — POCT GLYCOSYLATED HEMOGLOBIN (HGB A1C): Hemoglobin A1C: 8.4 % — AB (ref 4.0–5.6)

## 2019-08-09 NOTE — Addendum Note (Signed)
Addended by: Cardell Peach I on: 08/09/2019 07:58 AM   Modules accepted: Orders

## 2019-09-11 ENCOUNTER — Encounter: Payer: Self-pay | Admitting: Internal Medicine

## 2019-10-10 ENCOUNTER — Encounter: Payer: Self-pay | Admitting: Internal Medicine

## 2019-10-13 MED ORDER — ALPRAZOLAM 0.5 MG PO TABS: ORAL_TABLET | ORAL | 0 refills | Status: DC

## 2019-10-13 MED ORDER — AMPHETAMINE-DEXTROAMPHET ER 20 MG PO CP24: 20.0000 mg | ORAL_CAPSULE | Freq: Every day | ORAL | 0 refills | Status: DC | PRN

## 2019-10-21 ENCOUNTER — Ambulatory Visit: Payer: 59 | Admitting: Internal Medicine

## 2019-10-23 ENCOUNTER — Other Ambulatory Visit: Payer: Self-pay | Admitting: Internal Medicine

## 2019-11-01 ENCOUNTER — Other Ambulatory Visit (INDEPENDENT_AMBULATORY_CARE_PROVIDER_SITE_OTHER): Payer: 59

## 2019-11-01 DIAGNOSIS — E1149 Type 2 diabetes mellitus with other diabetic neurological complication: Secondary | ICD-10-CM

## 2019-11-01 LAB — LIPID PANEL
Cholesterol: 153 mg/dL (ref 0–200)
HDL: 59.2 mg/dL (ref >39.00)
LDL Cholesterol: 72 mg/dL (ref 0–99)
NonHDL: 93.89
Total CHOL/HDL Ratio: 3
Triglycerides: 108 mg/dL (ref 0.0–149.0)
VLDL: 21.6 mg/dL (ref 0.0–40.0)

## 2019-11-01 LAB — COMPREHENSIVE METABOLIC PANEL
ALT: 38 U/L — ABNORMAL HIGH (ref 0–35)
AST: 26 U/L (ref 0–37)
Albumin: 4.1 g/dL (ref 3.5–5.2)
Alkaline Phosphatase: 149 U/L — ABNORMAL HIGH (ref 39–117)
BUN: 8 mg/dL (ref 6–23)
CO2: 25 mEq/L (ref 19–32)
Calcium: 9.1 mg/dL (ref 8.4–10.5)
Chloride: 101 mEq/L (ref 96–112)
Creatinine, Ser: 0.74 mg/dL (ref 0.40–1.20)
GFR: 86.46 mL/min (ref >60.00)
Glucose, Bld: 198 mg/dL — ABNORMAL HIGH (ref 70–99)
Potassium: 3.8 mEq/L (ref 3.5–5.1)
Sodium: 135 mEq/L (ref 135–145)
Total Bilirubin: 0.3 mg/dL (ref 0.2–1.2)
Total Protein: 7.5 g/dL (ref 6.0–8.3)

## 2019-11-02 ENCOUNTER — Ambulatory Visit: Payer: 59 | Admitting: Internal Medicine

## 2019-11-02 NOTE — Progress Notes (Signed)
Subjective:    Patient ID: Lisa Duran, female    DOB: Nov 05, 1980, 39 y.o.   MRN: [id number]  HPI The patient is here for follow up of their chronic medical problems, including diabetes, dyslipidemia, migraine headaches, fibromyalgia, GERD, ADD, depression, anxiety.  She is taking all of her medications as prescribed.   She is exercising.  She is walking.  She is fairly compliant with a diabetic diet.    Her anxiety has been increased because of a merger at work.  She is unsure if her job position will be changing or even if she will have a job in the near future.  She has some SOB from increased anxiety, but feels comfortable that this is anxiety.  She is either sleeping too much or very little.    Her fibromyalgia flared up after a trip to [place] for work and increased stress/fatigue.  Her pain has improved.  The cymbalta and gabapentin help.    Medications and allergies reviewed with patient and updated if appropriate.  Patient Active Problem List   Diagnosis Date Noted  . Epigastric abdominal pain 06/17/2019  . Dyslipidemia 04/14/2019  . Sepsis secondary to UTI ([place]) 01/12/2019  . Enteritis 01/12/2019  . Cough 08/02/2018  . Strain of Achilles tendon, right, initial encounter 01/01/2018  . Sleep-related headache 04/06/2017  . Sleep walking disorder 04/06/2017  . Iron deficiency anemia 03/27/2017  . Arthralgia 02/03/2017  . Fibromyalgia 02/03/2017  . GERD (gastroesophageal reflux disease) 06/24/2015  . Eczema 06/22/2015  . Depression 06/22/2015  . Tachycardia 06/16/2014  . Paresthesias 06/12/2014  . Food allergy   . Wrist pain, right 09/14/2013  . Elbow pain, right 09/05/2013  . ADHD (attention deficit hyperactivity disorder) 01/27/2011  . Anxiety 02/20/2010  . ALLERGIC RHINITIS 11/21/2009  . OVARIAN CYST 11/21/2009  . Diabetes mellitus type 2 with neurological manifestations ([place]) 11/20/2009  . Class 2 obesity in adult 11/20/2009  . Migraine without aura 11/20/2009     Current Outpatient Medications on File Prior to Visit  Medication Sig Dispense Refill  . almotriptan (AXERT) 12.5 MG tablet Take 1 tablet (12.5 mg total) by mouth as needed for migraine. may repeat in 2 hours if needed 12 tablet 3  . ALPRAZolam (XANAX) 0.5 MG tablet TAKE 1 TABLET(0.5 MG) BY MOUTH THREE TIMES DAILY AS NEEDED FOR ANXIETY 30 tablet 0  . amphetamine-dextroamphetamine (ADDERALL XR) 20 MG 24 hr capsule Take 1 capsule (20 mg total) by mouth daily as needed (school or work days). 30 capsule 0  . DULoxetine (CYMBALTA) 60 MG capsule TAKE 1 CAPSULE BY MOUTH EVERY DAY 90 capsule 1  . gabapentin (NEURONTIN) 100 MG capsule TAKE 2 CAPSULES (200 MG TOTAL) BY MOUTH AT BEDTIME. 180 capsule 1  . glucose blood (ONETOUCH VERIO) test strip Use 2x a day 200 each 3  . Insulin Detemir (LEVEMIR FLEXTOUCH) 100 UNIT/ML Pen Inject 60 Units into the skin at bedtime. 20 pen 11  . Insulin Pen Needle (BD PEN NEEDLE NANO U/F) 32G X 4 MM MISC 1 each by Other route 4 (four) times daily. USE 4x A DAY 200 each 5  . insulin regular (NOVOLIN R RELION) 100 units/mL injection Inject 0.1-0.16 mLs (10-16 Units total) into the skin 3 (three) times daily before meals. 10 mL 11  . Insulin Syringe-Needle U-100 (B-D INS SYR ULTRAFINE .5CC/30G) 30G X 1/2" 0.5 ML MISC Use to inject insulin once daily. 100 each 11  . levonorgestrel (MIRENA) 20 MCG/24HR IUD 1 Intra Uterine Device (1  each total) by Intrauterine route once. 1 each 0  . metFORMIN (GLUCOPHAGE) 1000 MG tablet Take 2 tablets (2,000 mg total) by mouth daily with supper. TAKE 1 TABLET BY  MOUTH TWICE A DAY WITH A  MEAL 180 tablet 3  . metoprolol succinate (TOPROL-XL) 25 MG 24 hr tablet TAKE 1/2 TABLET BY MOUTH EVERY DAY 45 tablet 1  . omeprazole (PRILOSEC) 40 MG capsule TAKE 1 CAPSULE (40 MG TOTAL) BY MOUTH DAILY BEFORE SUPPER. 90 capsule 1  . ondansetron (ZOFRAN ODT) 4 MG disintegrating tablet Take 1 tablet (4 mg total) by mouth every 8 (eight) hours as needed for  nausea or vomiting. 20 tablet 0  . Prenatal Vit-Fe Fumarate-FA (MULTIVITAMIN-PRENATAL) 27-0.8 MG TABS tablet Take 1 tablet by mouth every other day.     . rosuvastatin (CRESTOR) 5 MG tablet Take 1 tablet (5 mg total) by mouth 2 (two) times a week. 13 tablet 3  . triamcinolone cream (KENALOG) 0.1 % Apply 1 application topically 2 (two) times daily as needed (for itching). 28.4 g 1   No current facility-administered medications on file prior to visit.    Past Medical History:  Diagnosis Date  . ADHD (attention deficit hyperactivity disorder)   . ALLERGIC RHINITIS   . Allergy   . Anemia   . ANXIETY   . Depression   . DIABETES MELLITUS, TYPE II   . Fibromyalgia 2018  . GERD (gastroesophageal reflux disease)   . MIGRAINE, COMMON   . Obesity, unspecified   . OVARIAN CYST     Past Surgical History:  Procedure Laterality Date  . NO PAST SURGERIES      Social History   Socioeconomic History  . Marital status: Single    Spouse name: Not on file  . Number of children: Not on file  . Years of education: Not on file  . Highest education level: Not on file  Occupational History  . Not on file  Tobacco Use  . Smoking status: Former Smoker    Quit date: 08/20/2002    Years since quitting: 17.2  . Smokeless tobacco: Never Used  . Tobacco comment: single, separated from spouse 02/2010.   Substance and Sexual Activity  . Alcohol use: Yes    Comment: rarely  . Drug use: No  . Sexual activity: Not Currently    Birth control/protection: I.U.D.  Other Topics Concern  . Not on file  Social History Narrative   Single, separated from spouse 02/2010   Social Determinants of Health   Financial Resource Strain:   . Difficulty of Paying Living Expenses:   Food Insecurity:   . Worried About Charity fundraiser in the Last Year:   . Arboriculturist in the Last Year:   Transportation Needs:   . Film/video editor (Medical):   [name] Lack of Transportation (Non-Medical):   Physical  Activity:   . Days of Exercise per Week:   . Minutes of Exercise per Session:   Stress:   . Feeling of Stress :   Social Connections:   . Frequency of Communication with Friends and Family:   . Frequency of Social Gatherings with Friends and Family:   . Attends Religious Services:   . Active Member of Clubs or Organizations:   . Attends Archivist Meetings:   [name] Marital Status:     Family History  Problem Relation Age of Onset  . Diabetes Mother   . Hyperlipidemia Mother   . Diabetes Father   .  Hyperlipidemia Father   . Coronary artery disease Father   . Hypertension Father   . Cancer Father        esoph  . Stroke Father   . Cancer Maternal Grandfather 59       Prostate and Lung  . Diabetes Sister        Gestational  . Berenice Primas' disease Maternal Aunt   . Cancer Maternal Uncle        Brain  . Stroke Paternal Grandmother   . Heart disease Paternal Grandmother   . Stroke Paternal Grandfather   . Heart disease Paternal Grandfather   . Diabetes Sister        Gestational and Type II  . Hashimoto's thyroiditis Cousin   . Cancer Maternal Uncle        Brain    Review of Systems  Constitutional: Positive for diaphoresis. Negative for chills and fever.  Respiratory: Positive for cough (occ, dry). Negative for shortness of breath and wheezing.   Cardiovascular: Positive for palpitations (related to anxiety). Negative for chest pain and leg swelling.  Musculoskeletal:       Rib pain  Neurological: Positive for headaches. Negative for dizziness.       Objective:   Vitals:   11/03/19 0839  BP: 138/84  Pulse: (!) 112  Resp: 16  Temp: 99 F (37.2 C)  SpO2: 98%   BP Readings from Last 3 Encounters:  11/03/19 138/84  08/08/19 122/88  06/17/19 118/80   Wt Readings from Last 3 Encounters:  11/03/19 228 lb (103.4 kg)  08/08/19 222 lb (100.7 kg)  06/17/19 217 lb (98.4 kg)   Body mass index is 39.14 kg/m.   Physical Exam    Constitutional: Appears  well-developed and well-nourished. No distress.  HENT:  Head: Normocephalic and atraumatic.  Neck: Neck supple. No tracheal deviation present. No thyromegaly present.  No cervical lymphadenopathy Cardiovascular: Normal rate, regular rhythm and normal heart sounds.   No murmur heard. No carotid bruit .  No edema Pulmonary/Chest: Effort normal and breath sounds normal. No respiratory distress. No has no wheezes. No rales.  Skin: Skin is warm and dry. Not diaphoretic.  Psychiatric: Normal mood and affect. Behavior is normal.      Assessment & Plan:    See Problem List for Assessment and Plan of chronic medical problems.    This visit occurred during the SARS-CoV-2 public health emergency.  Safety protocols were in place, including screening questions prior to the visit, additional usage of staff PPE, and extensive cleaning of exam room while observing appropriate contact time as indicated for disinfecting solutions.

## 2019-11-02 NOTE — Patient Instructions (Addendum)
  Medications reviewed and updated.  Changes include :   none    Please followup in 6 months   

## 2019-11-03 ENCOUNTER — Encounter: Payer: Self-pay | Admitting: Internal Medicine

## 2019-11-03 ENCOUNTER — Ambulatory Visit: Payer: 59 | Admitting: Internal Medicine

## 2019-11-03 ENCOUNTER — Other Ambulatory Visit: Payer: Self-pay

## 2019-11-03 VITALS — BP 138/84 | HR 112 | Temp 99.0°F | Resp 16 | Ht 64.0 in | Wt 228.0 lb

## 2019-11-03 DIAGNOSIS — F329 Major depressive disorder, single episode, unspecified: Secondary | ICD-10-CM

## 2019-11-03 DIAGNOSIS — F32A Depression, unspecified: Secondary | ICD-10-CM

## 2019-11-03 DIAGNOSIS — F909 Attention-deficit hyperactivity disorder, unspecified type: Secondary | ICD-10-CM

## 2019-11-03 DIAGNOSIS — Z6839 Body mass index (BMI) 39.0-39.9, adult: Secondary | ICD-10-CM | POA: Diagnosis not present

## 2019-11-03 DIAGNOSIS — F419 Anxiety disorder, unspecified: Secondary | ICD-10-CM

## 2019-11-03 DIAGNOSIS — K219 Gastro-esophageal reflux disease without esophagitis: Secondary | ICD-10-CM

## 2019-11-03 DIAGNOSIS — E785 Hyperlipidemia, unspecified: Secondary | ICD-10-CM | POA: Diagnosis not present

## 2019-11-03 DIAGNOSIS — R69 Illness, unspecified: Secondary | ICD-10-CM | POA: Diagnosis not present

## 2019-11-03 DIAGNOSIS — E1149 Type 2 diabetes mellitus with other diabetic neurological complication: Secondary | ICD-10-CM

## 2019-11-03 DIAGNOSIS — M797 Fibromyalgia: Secondary | ICD-10-CM

## 2019-11-03 DIAGNOSIS — G43009 Migraine without aura, not intractable, without status migrainosus: Secondary | ICD-10-CM

## 2019-11-03 NOTE — Assessment & Plan Note (Signed)
Chronic Taking Crestor 5 mg twice a week Tolerating medication without side effects Lipid panel well controlled Continue above

## 2019-11-03 NOTE — Assessment & Plan Note (Signed)
Chronic Infrequent Maybe 1-2 a month Takes Excedrin over-the-counter, axert as needed Controlled Continue

## 2019-11-03 NOTE — Assessment & Plan Note (Signed)
Chronic GERD much better controlled with daily omeprazole Continue daily omeprazole encouraged weight loss

## 2019-11-03 NOTE — Assessment & Plan Note (Signed)
Chronic With comorbidities of diabetes She is walking regularly for exercise Discussed diabetic diet, avoiding stress eating, smaller portions She will work on weight loss

## 2019-11-03 NOTE — Assessment & Plan Note (Signed)
Chronic Increased stress related to work and monitor going on.  There is some concern she may have to look for a new job He feels medication is helping-continue Cymbalta 60 mg daily, alprazolam as needed Continue regular exercise and other natural ways of decreasing stress Follow-up in 6 months

## 2019-11-03 NOTE — Assessment & Plan Note (Signed)
Chronic Controlled, stable Continue current dose of medication Adderall 20 mg daily as needed

## 2019-11-03 NOTE — Assessment & Plan Note (Signed)
Chronic Increased pain recently secondary to fatigue and stress, but pain has improved Continue gabapentin and Cymbalta, which are helping Continue regular walking Follow-up in 6 months

## 2019-11-03 NOTE — Assessment & Plan Note (Signed)
Chronic Controlled, stable Continue Cymbalta 60 mg daily  

## 2019-11-03 NOTE — Assessment & Plan Note (Signed)
Chronic Following with Dr. Alex Gardener per her Continue regular exercise Stressed compliance with a diabetic diet and avoiding stress eating

## 2019-11-04 ENCOUNTER — Emergency Department (HOSPITAL_COMMUNITY)
Admission: EM | Admit: 2019-11-04 | Discharge: 2019-11-04 | Disposition: A | Discharge status: Home / Self Care | Payer: 59 | Attending: Emergency Medicine | Admitting: Emergency Medicine

## 2019-11-04 ENCOUNTER — Other Ambulatory Visit: Payer: Self-pay

## 2019-11-04 ENCOUNTER — Encounter (HOSPITAL_COMMUNITY): Payer: Self-pay | Admitting: *Deleted

## 2019-11-04 ENCOUNTER — Ambulatory Visit: Payer: 59 | Attending: Internal Medicine

## 2019-11-04 DIAGNOSIS — Z91010 Allergy to peanuts: Secondary | ICD-10-CM | POA: Insufficient documentation

## 2019-11-04 DIAGNOSIS — Z794 Long term (current) use of insulin: Secondary | ICD-10-CM | POA: Diagnosis not present

## 2019-11-04 DIAGNOSIS — Z23 Encounter for immunization: Secondary | ICD-10-CM

## 2019-11-04 DIAGNOSIS — T7840XA Allergy, unspecified, initial encounter: Secondary | ICD-10-CM | POA: Diagnosis present

## 2019-11-04 DIAGNOSIS — R0602 Shortness of breath: Secondary | ICD-10-CM | POA: Diagnosis not present

## 2019-11-04 DIAGNOSIS — E1149 Type 2 diabetes mellitus with other diabetic neurological complication: Secondary | ICD-10-CM | POA: Diagnosis not present

## 2019-11-04 DIAGNOSIS — Z79899 Other long term (current) drug therapy: Secondary | ICD-10-CM | POA: Diagnosis not present

## 2019-11-04 DIAGNOSIS — T782XXA Anaphylactic shock, unspecified, initial encounter: Secondary | ICD-10-CM | POA: Diagnosis not present

## 2019-11-04 DIAGNOSIS — R000 Tachycardia, unspecified: Secondary | ICD-10-CM | POA: Diagnosis not present

## 2019-11-04 DIAGNOSIS — R079 Chest pain, unspecified: Secondary | ICD-10-CM | POA: Insufficient documentation

## 2019-11-04 DIAGNOSIS — L509 Urticaria, unspecified: Secondary | ICD-10-CM | POA: Diagnosis not present

## 2019-11-04 DIAGNOSIS — T8052XA Anaphylactic reaction due to vaccination, initial encounter: Secondary | ICD-10-CM | POA: Diagnosis not present

## 2019-11-04 DIAGNOSIS — Z87891 Personal history of nicotine dependence: Secondary | ICD-10-CM | POA: Insufficient documentation

## 2019-11-04 DIAGNOSIS — R0689 Other abnormalities of breathing: Secondary | ICD-10-CM | POA: Diagnosis not present

## 2019-11-04 DIAGNOSIS — Z91012 Allergy to eggs: Secondary | ICD-10-CM | POA: Diagnosis not present

## 2019-11-04 MED ORDER — EPINEPHRINE 0.3 MG/0.3ML IJ SOAJ
0.3000 mg | Freq: Once | INTRAMUSCULAR | Status: AC
  Administered 2019-11-04: 0.3 mg via INTRAMUSCULAR
  Filled 2019-11-04: qty 0.3

## 2019-11-04 MED ORDER — EPINEPHRINE 0.3 MG/0.3ML IJ SOAJ: 0.3000 mg | Freq: Once | INTRAMUSCULAR | 3 refills | Status: AC

## 2019-11-04 MED ORDER — SODIUM CHLORIDE 0.9 % IV BOLUS
1000.0000 mL | Freq: Once | INTRAVENOUS | Status: AC
  Administered 2019-11-04: 1000 mL via INTRAVENOUS

## 2019-11-04 NOTE — ED Provider Notes (Signed)
Patient rechecked prior to discharge.  She is slightly tachycardic, but otherwise feels completely normal.  Discharge medication EpiPen   Nat Christen, MD 11/04/19 [phone]

## 2019-11-04 NOTE — ED Triage Notes (Addendum)
Pt brought in by [facility] with c/o possible allergic reaction to Moderna Covid vaccine. Pt reports that approximately 20-25 minutes after the vaccine injection (1st Covid vaccine shot) she started having hives, difficulty breathing, chest tightness, lightheadedness. Medical team at the vaccine site started 20g IV in left [facility] and gave Bendryl 50mg  & Solumedrol 125mg  IV prior to EMS arrival. Medical team reported to EMS that pt was wheezing initially, but she was clear in all lung fields for EMS. HR 130, ST on monitor per EMS.

## 2019-11-04 NOTE — Progress Notes (Signed)
   Covid-19 Vaccination Clinic  Name:  Nabil Moulin    MRN: [id number] DOB: 1981/01/31  11/04/2019  Ms. Mabey was observed post Covid-19 immunization for 30 minutes based on pre-vaccination screening .  During the observation period, she experienced an adverse reaction with the following symptoms:  hives, mouth dry, lightheaded, throat itching, chest tightness.  Assessment : Time of assessment 1035.  Actions taken: Patient was placed in a wheelchair, taken to the triage area. She had hives to her arms, behind her neck and ears. She was flushed in the face. BP 138/91, P 135 initially, last BP 132/91, P 116.   Medications administered: 1039 Benadryl 50 mg IM to RD given; 1047-Solumedrol 125 mg IV given via 20 G Saline Lock Left AC all by [facility].   Disposition: [facility] via Ambulance  Patient's symptoms of rash improved, she still had itching of her throat and chest tightness with no audible wheezing after medications given and when ambulance arrived.  Immunizations Administered    Name Date Dose VIS Date Route   Moderna COVID-19 Vaccine 11/04/2019 10:10 AM 0.5 mL 07/12/2019 Intramuscular   Manufacturer: Moderna   Lot: [id number]   [id number]

## 2019-11-04 NOTE — ED Provider Notes (Signed)
[facility] EMERGENCY DEPARTMENT Provider Note   CSN: [id number] Arrival date & time: 11/04/19  1126     History Chief Complaint  Patient presents with  . Allergic Reaction    Lisa Duran is a 38 y.o. female.  Patient with history of allergies to chicken and eggs, anxiety and depression history presents after reaction to Materna vaccine.  Patient started having chest tightness, hives difficulty breathing.  Patient received Benadryl and Solu-Medrol on route.  Patient feels that the rash improved however chest tightness is the same or worsening with feeling short of breath.  Patient has received the EpiPen in the past.        Past Medical History:  Diagnosis Date  . ADHD (attention deficit hyperactivity disorder)   . ALLERGIC RHINITIS   . Allergy   . Anemia   . ANXIETY   . Depression   . DIABETES MELLITUS, TYPE II   . Fibromyalgia 2018  . GERD (gastroesophageal reflux disease)   . MIGRAINE, COMMON   . Obesity, unspecified   . OVARIAN CYST     Patient Active Problem List   Diagnosis Date Noted  . Dyslipidemia 04/14/2019  . Sepsis secondary to UTI ([place]) 01/12/2019  . Enteritis 01/12/2019  . Sleep walking disorder 04/06/2017  . Iron deficiency anemia 03/27/2017  . Arthralgia 02/03/2017  . Fibromyalgia 02/03/2017  . GERD (gastroesophageal reflux disease) 06/24/2015  . Eczema 06/22/2015  . Depression 06/22/2015  . Tachycardia 06/16/2014  . Paresthesias 06/12/2014  . Food allergy   . ADHD (attention deficit hyperactivity disorder) 01/27/2011  . Anxiety 02/20/2010  . ALLERGIC RHINITIS 11/21/2009  . OVARIAN CYST 11/21/2009  . Diabetes mellitus type 2 with neurological manifestations ([place]) 11/20/2009  . Class 2 obesity in adult 11/20/2009  . Migraine without aura 11/20/2009    Past Surgical History:  Procedure Laterality Date  . NO PAST SURGERIES       OB History   No obstetric history on file.     Family History  Problem Relation Age of Onset  .  Diabetes Mother   . Hyperlipidemia Mother   . Diabetes Father   . Hyperlipidemia Father   . Coronary artery disease Father   . Hypertension Father   . Cancer Father        esoph  . Stroke Father   . Cancer Maternal Grandfather 67       Prostate and Lung  . Diabetes Sister        Gestational  . Berenice Primas' disease Maternal Aunt   . Cancer Maternal Uncle        Brain  . Stroke Paternal Grandmother   . Heart disease Paternal Grandmother   . Stroke Paternal Grandfather   . Heart disease Paternal Grandfather   . Diabetes Sister        Gestational and Type II  . Hashimoto's thyroiditis Cousin   . Cancer Maternal Uncle        Brain    Social History   Tobacco Use  . Smoking status: Former Smoker    Quit date: 08/20/2002    Years since quitting: 17.2  . Smokeless tobacco: Never Used  . Tobacco comment: single, separated from spouse 02/2010.   Substance Use Topics  . Alcohol use: Yes    Comment: couple of drinks a week  . Drug use: No    Home Medications Prior to Admission medications   Medication Sig Start Date End Date Taking? Authorizing Provider  almotriptan (AXERT) 12.5 MG tablet Take 1  tablet (12.5 mg total) by mouth as needed for migraine. may repeat in 2 hours if needed 10/20/18   Binnie Rail, MD  ALPRAZolam (XANAX) 0.5 MG tablet TAKE 1 TABLET(0.5 MG) BY MOUTH THREE TIMES DAILY AS NEEDED FOR ANXIETY 10/13/19   Burns, Claudina Lick, MD  amphetamine-dextroamphetamine (ADDERALL XR) 20 MG 24 hr capsule Take 1 capsule (20 mg total) by mouth daily as needed (school or work days). 10/13/19   Binnie Rail, MD  DULoxetine (CYMBALTA) 60 MG capsule TAKE 1 CAPSULE BY MOUTH EVERY DAY 10/24/19   Binnie Rail, MD  gabapentin (NEURONTIN) 100 MG capsule TAKE 2 CAPSULES (200 MG TOTAL) BY MOUTH AT BEDTIME. 10/24/19   Burns, Claudina Lick, MD  glucose blood (ONETOUCH VERIO) test strip Use 2x a day 01/22/18   Philemon Kingdom, MD  Insulin Detemir (LEVEMIR FLEXTOUCH) 100 UNIT/ML Pen Inject 60 Units into the  skin at bedtime. 06/01/19   Philemon Kingdom, MD  Insulin Pen Needle (BD PEN NEEDLE NANO U/F) 32G X 4 MM MISC 1 each by Other route 4 (four) times daily. USE 4x A DAY 04/27/19   Renato Shin, MD  insulin regular (NOVOLIN R RELION) 100 units/mL injection Inject 0.1-0.16 mLs (10-16 Units total) into the skin 3 (three) times daily before meals. 08/08/19   Philemon Kingdom, MD  Insulin Syringe-Needle U-100 (B-D INS SYR ULTRAFINE .5CC/30G) 30G X 1/2" 0.5 ML MISC Use to inject insulin once daily. 05/06/19   Philemon Kingdom, MD  levonorgestrel (MIRENA) 20 MCG/24HR IUD 1 Intra Uterine Device (1 each total) by Intrauterine route once. 10/03/13   Rowe Clack, MD  metFORMIN (GLUCOPHAGE) 1000 MG tablet Take 2 tablets (2,000 mg total) by mouth daily with supper. TAKE 1 TABLET BY  MOUTH TWICE A DAY WITH A  MEAL 04/26/19   Philemon Kingdom, MD  metoprolol succinate (TOPROL-XL) 25 MG 24 hr tablet TAKE 1/2 TABLET BY MOUTH EVERY DAY 10/24/19   Binnie Rail, MD  omeprazole (PRILOSEC) 40 MG capsule TAKE 1 CAPSULE (40 MG TOTAL) BY MOUTH DAILY BEFORE SUPPER. 10/24/19   Burns, Claudina Lick, MD  ondansetron (ZOFRAN ODT) 4 MG disintegrating tablet Take 1 tablet (4 mg total) by mouth every 8 (eight) hours as needed for nausea or vomiting. 06/17/19   Binnie Rail, MD  Prenatal Vit-Fe Fumarate-FA (MULTIVITAMIN-PRENATAL) 27-0.8 MG TABS tablet Take 1 tablet by mouth every other day.     [provider]  rosuvastatin (CRESTOR) 5 MG tablet Take 1 tablet (5 mg total) by mouth 2 (two) times a week. 04/25/19   Binnie Rail, MD  triamcinolone cream (KENALOG) 0.1 % Apply 1 application topically 2 (two) times daily as needed (for itching). 03/10/19   Binnie Rail, MD    Allergies    Chicken allergy, Other, Propofol, Basaglar kwikpen [insulin glargine], Adhesive [tape], Gluten meal, Peanut-containing drug products, Novolog [insulin aspart], Tresiba [insulin degludec], and Victoza [liraglutide]  Review of Systems     Review of Systems  Constitutional: Negative for chills and fever.  HENT: Negative for congestion.   Eyes: Negative for visual disturbance.  Respiratory: Positive for shortness of breath.   Cardiovascular: Positive for chest pain. Negative for leg swelling.  Gastrointestinal: Negative for abdominal pain and vomiting.  Genitourinary: Negative for dysuria and flank pain.  Musculoskeletal: Negative for back pain, neck pain and neck stiffness.  Skin: Positive for rash.  Neurological: Positive for light-headedness. Negative for headaches.    Physical Exam Updated Vital Signs Ht 5\' 4"  (1.626  m)   Wt 103.4 kg   SpO2 100%   BMI 39.14 kg/m   Physical Exam Vitals and nursing note reviewed.  Constitutional:      Appearance: She is well-developed.  HENT:     Head: Normocephalic and atraumatic.  Eyes:     General:        Right eye: No discharge.        Left eye: No discharge.     Conjunctiva/sclera: Conjunctivae normal.  Neck:     Trachea: No tracheal deviation.  Cardiovascular:     Rate and Rhythm: Regular rhythm. Tachycardia present.  Pulmonary:     Effort: Pulmonary effort is normal.     Breath sounds: Normal breath sounds.  Abdominal:     General: There is no distension.     Palpations: Abdomen is soft.     Tenderness: There is no abdominal tenderness. There is no guarding.  Musculoskeletal:     Cervical back: Normal range of motion and neck supple.  Skin:    General: Skin is warm.     Findings: No rash.  Neurological:     Mental Status: She is alert and oriented to person, place, and time.  Psychiatric:        Mood and Affect: Mood is anxious.     ED Results / Procedures / Treatments   Labs (all labs ordered are listed, but only abnormal results are displayed) Labs Reviewed - No data to display  EKG None  Radiology No results found.  Procedures .Critical Care Performed by: Elnora Morrison, MD Authorized by: Elnora Morrison, MD   Critical care provider  statement:    Critical care time (minutes):  35   Critical care start time:  11/04/2019 11:36 AM   Critical care time was exclusive of:  Separately billable procedures and treating other patients and teaching time   Critical care was time spent personally by me on the following activities:  Evaluation of patient's response to treatment, examination of patient, ordering and performing treatments and interventions, pulse oximetry, re-evaluation of patient's condition and review of old charts   (including critical care time)  Medications Ordered in ED Medications  EPINEPHrine (EPI-PEN) injection 0.3 mg (has no administration in time range)  sodium chloride 0.9 % bolus 1,000 mL (has no administration in time range)    ED Course  I have reviewed the triage vital signs and the nursing notes.  Pertinent labs & imaging results that were available during my care of the patient were reviewed by me and considered in my medical decision making (see chart for details).    MDM Rules/Calculators/A&P                     Patient presents with clinical concern for anaphylaxis with significant shortness of breath, hives and recent vaccine.  Discussed risk and benefits of treatment options.  Steroids have already been given.  We agreed for EpiPen, IV fluids and monitoring in the emergency room.  Patient improved clinically chest tightness and shortness of breath resolved.  Challenge on reassessment is heart rate remains fast.  Patient does not have any symptoms.  Repeat IV fluids given.  Patient care be signed out to monitor and reassess and ensure heart rate improves if not may consider D-dimer or other testing.  Final Clinical Impression(s) / ED Diagnoses Final diagnoses:  Anaphylaxis, initial encounter    Rx / DC Orders ED Discharge Orders    None  Elnora Morrison, MD 11/04/19 [phone]

## 2019-11-04 NOTE — Discharge Instructions (Signed)
For tongue swelling, significant shortness of breath take Benadryl and use EpiPen and then come back to the emergency room.  Call the ambulance if you need to. Take Benadryl every 6 hours as needed for itching, rash.

## 2019-11-07 ENCOUNTER — Ambulatory Visit: Payer: 59 | Admitting: Internal Medicine

## 2019-11-07 ENCOUNTER — Encounter: Payer: Self-pay | Admitting: Internal Medicine

## 2019-11-07 ENCOUNTER — Other Ambulatory Visit: Payer: Self-pay

## 2019-11-07 VITALS — BP 130/90 | HR 90 | Ht 64.0 in | Wt 228.0 lb

## 2019-11-07 DIAGNOSIS — E785 Hyperlipidemia, unspecified: Secondary | ICD-10-CM

## 2019-11-07 DIAGNOSIS — Z6839 Body mass index (BMI) 39.0-39.9, adult: Secondary | ICD-10-CM | POA: Diagnosis not present

## 2019-11-07 DIAGNOSIS — E1149 Type 2 diabetes mellitus with other diabetic neurological complication: Secondary | ICD-10-CM | POA: Diagnosis not present

## 2019-11-07 LAB — POCT GLYCOSYLATED HEMOGLOBIN (HGB A1C): Hemoglobin A1C: 8.5 % — AB (ref 4.0–5.6)

## 2019-11-07 MED ORDER — FREESTYLE LIBRE 2 SENSOR MISC: 1.0000 | 3 refills | Status: DC

## 2019-11-07 MED ORDER — FREESTYLE LIBRE 2 READER DEVI: 1.0000 | Freq: Every day | 0 refills | Status: DC

## 2019-11-07 NOTE — Patient Instructions (Addendum)
Please continue: - Metformin 2000 mg with dinner - NPH 30 units 2x a day - Regular insulin 10-16 units 15 min before the 3 main meals.  Please return in 3-4 months with your sugar log.

## 2019-11-07 NOTE — Progress Notes (Signed)
Subjective:     Patient ID: Lisa Duran, female   DOB: 1981/07/22, 39 y.o.   MRN: [id number]  This visit occurred during the SARS-CoV-2 public health emergency.  Safety protocols were in place, including screening questions prior to the visit, additional usage of staff PPE, and extensive cleaning of exam room while observing appropriate contact time as indicated for disinfecting solutions.   HPI Lisa Duran is a pleasant 39 y.o. woman returning for f/u for DM2, insulin dependent, uncontrolled, dx [id number], w/o complications. Last visit 3 months ago.  She presented to the emergency room 3 days ago for anaphylaxis to the [facility] COVID-19 vaccine.  She was given epinephrine.  She feels much better now.  Since the start of the COVID-19 pandemic, she did a better job going for walks and improving her diet.  She returned to work, traveling.   Reviewed HbA1c levels: Lab Results  Component Value Date   HGBA1C 8.4 (A) 08/09/2019   HGBA1C 9.1 (A) 04/14/2019   HGBA1C 9.0 (H) 10/20/2018   HGBA1C 7.2 (A) 05/27/2018   HGBA1C 9.4 (A) 01/22/2018   HGBA1C 6.2 05/25/2017   HGBA1C 7.6 12/25/2016   HGBA1C 9.5 08/19/2016   HGBA1C 8.6 (H) 12/20/2015   HGBA1C 8.2 11/22/2015   She is on:   - Metformin 1000 mg 2x a day >> 2000 mg with dinner or at bedtime - Lantus 32 >> Levemir 60 units at bedtime >> NPH 30 units 2x a day - NovoLog 8-10 >> 10-16 units 3 times a day before meals We stopped glipizide ER and also Januvia. We tried Cycloset 0.8 mg daily >> stopped end of 11/2016 b/c GERD/AP/C We tried Trulicity >> could not tolerate it: N/V/AP. She restarted Januvia 09/2016. She was on Invokana 100 mg in am (started 12/2014) >> 2 yeast inf >> tx with Diflucan; she was exhausted and had nocturia >> stopped 03/12/2015 Tried Victoza 2 mo ago >> AP, severe GERD, inj site rxn Tried Januvia >> tolerated it well, but stopped when Invokana was suggested.  She was also on Actos, but taken off b/c good control. She has  problems tolerating insulins.  Basaglar and [place] caused eczema and also GI symptoms..  She is currently on Levemir, which she tolerates well, but sugars remain high.   She has a [employer], structural.  She checks sugars 4x a day per meter download and they are extremely fluctuating: - am: 101, 256 >> 140-280 >> 118, 152-169, 253 >> 118-263, 311 - before lunch:74-126 >> n/c >> 106-131 >> n/c >> 152, 184, 229 >> 119-278 - 2h after lunch 222, 247 >> 110-114 >> n/c - before dinner: 96-126, 157 >> 120-180 (ave 160) >> n/c >> 62 (traveling), 76-271 - 2h post dinner: 119-207 >> n/c >> 82-148 >> n/c - b/t: 159-190 >> n/c >> 180 >> 140-180 >> 214 >> 108-354 - nighttime: 52 x1, 110 She has hypoglycemia awareness in the 80s.  No CKD: Lab Results  Component Value Date   BUN 8 11/01/2019   Lab Results  Component Value Date   CREATININE 0.74 11/01/2019   No MAU: Lab Results  Component Value Date   MICRALBCREAT 0.8 10/20/2018   MICRALBCREAT 0.5 02/03/2017   MICRALBCREAT 0.7 12/20/2015   MICRALBCREAT 0.8 12/19/2014   MICRALBCREAT 0.2 01/19/2014   -+ HL: Lab Results  Component Value Date   CHOL 153 11/01/2019   HDL 59.20 11/01/2019   LDLCALC 72 11/01/2019   TRIG 108.0 11/01/2019   CHOLHDL 3 11/01/2019  Not on a statin.  Last eye exam 05/2019: No DR- [facility]. + Numbness and tingling in hand and feet.  She feels much better on Cymbalta and Neurontin  She had CP >> had a stress test 07/04/2015 >>  Normal.  She is on metoprolol for her high heart rate She has fibromyalgia.   She also has a history of iron deficiency anemia and had iron infusions.  She is also on B12 tablets. In 2019, she just got out of a 5-year relationship.  She was under a lot of stress.   She is on Cymbalta for anxiety.  Review of Systems Constitutional: no weight gain/no weight loss, no fatigue, no subjective hyperthermia, no subjective hypothermia Eyes: no blurry vision, no  xerophthalmia ENT: no sore throat, no nodules palpated in neck, no dysphagia, no odynophagia, no hoarseness Cardiovascular: no CP/no SOB/no palpitations/no leg swelling Respiratory: no cough/no SOB/no wheezing Gastrointestinal: no N/no V/no D/no C/no acid reflux Musculoskeletal: no muscle aches/no joint aches Skin: no rashes, no hair loss Neurological: no tremors/+ numbness/+ tingling/no dizziness  I reviewed pt's medications, allergies, PMH, social hx, family hx, and changes were documented in the history of present illness. Otherwise, unchanged from my initial visit note.  Past Medical History:  Diagnosis Date  . ADHD (attention deficit hyperactivity disorder)   . ALLERGIC RHINITIS   . Allergy   . Anemia   . ANXIETY   . Depression   . DIABETES MELLITUS, TYPE II   . Fibromyalgia 2018  . GERD (gastroesophageal reflux disease)   . MIGRAINE, COMMON   . Obesity, unspecified   . OVARIAN CYST    Past Surgical History:  Procedure Laterality Date  . NO PAST SURGERIES     Social History   Socioeconomic History  . Marital status: Single    Spouse name: Not on file  . Number of children: Not on file  . Years of education: Not on file  . Highest education level: Not on file  Occupational History  . Not on file  Tobacco Use  . Smoking status: Former Smoker    Quit date: 08/20/2002    Years since quitting: 17.2  . Smokeless tobacco: Never Used  . Tobacco comment: single, separated from spouse 02/2010.   Substance and Sexual Activity  . Alcohol use: Yes    Comment: couple of drinks a week  . Drug use: No  . Sexual activity: Not Currently    Birth control/protection: I.U.D.  Other Topics Concern  . Not on file  Social History Narrative   Single, separated from spouse 02/2010   Social Determinants of Health   Financial Resource Strain:   . Difficulty of Paying Living Expenses:   Food Insecurity:   . Worried About Charity fundraiser in the Last Year:   . Arboriculturist in  the Last Year:   Transportation Needs:   . Film/video editor (Medical):   [name] Lack of Transportation (Non-Medical):   Physical Activity:   . Days of Exercise per Week:   . Minutes of Exercise per Session:   Stress:   . Feeling of Stress :   Social Connections:   . Frequency of Communication with Friends and Family:   . Frequency of Social Gatherings with Friends and Family:   . Attends Religious Services:   . Active Member of Clubs or Organizations:   . Attends Archivist Meetings:   [name] Marital Status:   Intimate Partner Violence:   .  Fear of Current or Ex-Partner:   . Emotionally Abused:   [name] Physically Abused:   . Sexually Abused:    Current Outpatient Medications on File Prior to Visit  Medication Sig Dispense Refill  . almotriptan (AXERT) 12.5 MG tablet Take 1 tablet (12.5 mg total) by mouth as needed for migraine. may repeat in 2 hours if needed 12 tablet 3  . ALPRAZolam (XANAX) 0.5 MG tablet TAKE 1 TABLET(0.5 MG) BY MOUTH THREE TIMES DAILY AS NEEDED FOR ANXIETY 30 tablet 0  . amphetamine-dextroamphetamine (ADDERALL XR) 20 MG 24 hr capsule Take 1 capsule (20 mg total) by mouth daily as needed (school or work days). 30 capsule 0  . DULoxetine (CYMBALTA) 60 MG capsule TAKE 1 CAPSULE BY MOUTH EVERY DAY 90 capsule 1  . gabapentin (NEURONTIN) 100 MG capsule TAKE 2 CAPSULES (200 MG TOTAL) BY MOUTH AT BEDTIME. 180 capsule 1  . glucose blood (ONETOUCH VERIO) test strip Use 2x a day 200 each 3  . insulin NPH Human (NOVOLIN N) 100 UNIT/ML injection Inject 20-30 Units into the skin. 30 units in am and 20 units at night time    . Insulin Pen Needle (BD PEN NEEDLE NANO U/F) 32G X 4 MM MISC 1 each by Other route 4 (four) times daily. USE 4x A DAY 200 each 5  . insulin regular (NOVOLIN R RELION) 100 units/mL injection Inject 0.1-0.16 mLs (10-16 Units total) into the skin 3 (three) times daily before meals. 10 mL 11  . Insulin Syringe-Needle U-100 (B-D INS SYR ULTRAFINE .5CC/30G) 30G  X 1/2" 0.5 ML MISC Use to inject insulin once daily. 100 each 11  . levonorgestrel (MIRENA) 20 MCG/24HR IUD 1 Intra Uterine Device (1 each total) by Intrauterine route once. 1 each 0  . metFORMIN (GLUCOPHAGE) 1000 MG tablet Take 2 tablets (2,000 mg total) by mouth daily with supper. TAKE 1 TABLET BY  MOUTH TWICE A DAY WITH A  MEAL 180 tablet 3  . metoprolol succinate (TOPROL-XL) 25 MG 24 hr tablet TAKE 1/2 TABLET BY MOUTH EVERY DAY 45 tablet 1  . omeprazole (PRILOSEC) 40 MG capsule TAKE 1 CAPSULE (40 MG TOTAL) BY MOUTH DAILY BEFORE SUPPER. 90 capsule 1  . ondansetron (ZOFRAN ODT) 4 MG disintegrating tablet Take 1 tablet (4 mg total) by mouth every 8 (eight) hours as needed for nausea or vomiting. 20 tablet 0  . Prenatal Vit-Fe Fumarate-FA (MULTIVITAMIN-PRENATAL) 27-0.8 MG TABS tablet Take 1 tablet by mouth every other day.     . rosuvastatin (CRESTOR) 5 MG tablet Take 1 tablet (5 mg total) by mouth 2 (two) times a week. 13 tablet 3  . triamcinolone cream (KENALOG) 0.1 % Apply 1 application topically 2 (two) times daily as needed (for itching). 28.4 g 1   No current facility-administered medications on file prior to visit.   Allergies  Allergen Reactions  . Chicken Allergy Anaphylaxis and Shortness Of Breath  . Other Itching, Swelling and Other (See Comments)    NUTS (of ANY kind): Lips and mouth swell, but no breathing impairment & migraines and eczema are triggered  . Propofol Shortness Of Breath    SOB, chest tightness, wheezing after colonoscopy on 09-18-15  . Basaglar Kwikpen [Insulin Glargine] Palpitations and Hypertension  . Adhesive [Tape] Other (See Comments)    Makes the skin VERY RED   . Gluten Meal Diarrhea  . Peanut-Containing Drug Products Itching, Swelling and Other (See Comments)    Lips and mouth swell, but no breathing impairment & migraines  and eczema are triggered   . Novolog [Insulin Aspart] Rash  . Tyler Aas [Insulin Degludec] Rash    eczema  . Victoza [Liraglutide]  Rash   Family History  Problem Relation Age of Onset  . Diabetes Mother   . Hyperlipidemia Mother   . Diabetes Father   . Hyperlipidemia Father   . Coronary artery disease Father   . Hypertension Father   . Cancer Father        esoph  . Stroke Father   . Cancer Maternal Grandfather 29       Prostate and Lung  . Diabetes Sister        Gestational  . Berenice Primas' disease Maternal Aunt   . Cancer Maternal Uncle        Brain  . Stroke Paternal Grandmother   . Heart disease Paternal Grandmother   . Stroke Paternal Grandfather   . Heart disease Paternal Grandfather   . Diabetes Sister        Gestational and Type II  . Hashimoto's thyroiditis Cousin   . Cancer Maternal Uncle        Brain       Objective:   Physical Exam BP 130/90   Pulse 90   Ht 5\' 4"  (1.626 m)   Wt 228 lb (103.4 kg)   SpO2 98%   BMI 39.14 kg/m   Wt Readings from Last 3 Encounters:  11/07/19 228 lb (103.4 kg)  11/04/19 228 lb (103.4 kg)  11/03/19 228 lb (103.4 kg)   Constitutional: overweight, in NAD Eyes: PERRLA, EOMI, no exophthalmos ENT: moist mucous membranes, no thyromegaly, no cervical lymphadenopathy Cardiovascular: RRR, No MRG Respiratory: CTA B Gastrointestinal: abdomen soft, NT, ND, BS+ Musculoskeletal: no deformities, strength intact in all 4 Skin: moist, warm, no rashes Neurological: no tremor with outstretched hands, DTR normal in all 4  Assessment:     1. DM2, insulin-dependent, uncontrolled, without complications - r/o autoimmunity Component     Latest Ref Rng 08/17/2012  Glutamic Acid Decarb Ab     <=1.0 U/mL <1.0  Pancreatic Islet Cell Antibody     < 5 JDF Units <5  C-Peptide     0.80 - 3.90 ng/mL 1.84  Glucose     70 - 99 mg/dL 104 (H)     2. Obesity class II BMI Classification:  < 18.5 underweight   18.5-24.9 normal weight   25.0-29.9 overweight   30.0-34.9 class I obesity   35.0-39.9 class II obesity   ? 40.0 class III obesity   3. HL  Plan:     1.  Patient with longstanding, uncontrolled, type 2 diabetes, on Metformin and basal-bolus insulin regimen, with still poor control.  Unfortunately, we cannot use SGLT 2 inhibitor and GLP-1 receptor agonist due to previous intolerance.  We also had to switch from NovoLog to regular insulin due to rash. -At last visit, sugars were still high and we increased her NovoLog doses.  I also advised her to stop Januvia which I did not feel was helping much.  We did not change the Metformin and Levemir dose. -At this visit, sugars are extremely fluctuating, with normal sugars between 70s and 120s alternating with high blood sugars in the 250s and above.  Because of the significant variability, unfortunately, we cannot make changes in her regimen but I gave her sugar log and advised her to write down not only the sugars but also insulin doses and foods test may influence her blood sugars. -She did have a low  blood sugar at night but it was an isolated incident and for now I would suggest to continue the current dose of insulins. -However, due to this and also her variability, I sent a prescription for the freestyle libre 2 CGM to her pharmacy.  I am hoping that she can obtain this. - I suggested to:  Patient Instructions  Please continue: - Metformin 2000 mg with dinner - NPH 30 units 2x a day - Regular insulin 10-16 units 15 min before the 3 main meals.  Please return in 3-4 months with your sugar log.   - we checked her HbA1c: 8.5% (slightly higher) - advised to check sugars at different times of the day - 4x a day, rotating check times - advised for yearly eye exams >> she is UTD - return to clinic in 3-4 months    2. Obesity class II -She gained 6 pounds since last visit -We have her on Metformin, which is appetite suppressant and may contribute to weight loss -Unfortunately, we cannot use SGLT 2 inhibitor and GLP-1 receptor agonists which would have helped with weight loss, due to previous  intolerance  3. HL -Reviewed latest lipid panel from 10/2019: Fractions at goal Lab Results  Component Value Date   CHOL 153 11/01/2019   HDL 59.20 11/01/2019   LDLCALC 72 11/01/2019   TRIG 108.0 11/01/2019   CHOLHDL 3 11/01/2019  -She is not on a statin  Philemon Kingdom, MD PhD [facility] Endocrinology

## 2019-11-21 ENCOUNTER — Encounter: Payer: Self-pay | Admitting: Internal Medicine

## 2019-11-22 MED ORDER — ALPRAZOLAM 0.5 MG PO TABS: ORAL_TABLET | ORAL | 0 refills | Status: DC

## 2019-12-03 ENCOUNTER — Encounter: Payer: Self-pay | Admitting: Internal Medicine

## 2019-12-05 ENCOUNTER — Encounter: Payer: Self-pay | Admitting: Internal Medicine

## 2019-12-05 DIAGNOSIS — Z3202 Encounter for pregnancy test, result negative: Secondary | ICD-10-CM | POA: Diagnosis not present

## 2019-12-05 DIAGNOSIS — Z30433 Encounter for removal and reinsertion of intrauterine contraceptive device: Secondary | ICD-10-CM | POA: Diagnosis not present

## 2019-12-05 NOTE — Telephone Encounter (Signed)
Check [facility] registry last filled 10/13/2019.[name] KitchenJohny Duran

## 2019-12-06 MED ORDER — AMPHETAMINE-DEXTROAMPHET ER 20 MG PO CP24: 20.0000 mg | ORAL_CAPSULE | Freq: Every day | ORAL | 0 refills | Status: DC | PRN

## 2019-12-06 MED ORDER — ALPRAZOLAM 0.5 MG PO TABS: ORAL_TABLET | ORAL | 0 refills | Status: DC

## 2019-12-07 ENCOUNTER — Ambulatory Visit: Payer: Self-pay

## 2020-02-14 ENCOUNTER — Encounter: Payer: Self-pay | Admitting: Internal Medicine

## 2020-02-15 MED ORDER — AMPHETAMINE-DEXTROAMPHET ER 20 MG PO CP24: 20.0000 mg | ORAL_CAPSULE | Freq: Every day | ORAL | 0 refills | Status: DC | PRN

## 2020-02-15 MED ORDER — ALPRAZOLAM 0.5 MG PO TABS: ORAL_TABLET | ORAL | 0 refills | Status: DC

## 2020-02-28 ENCOUNTER — Ambulatory Visit: Payer: 59 | Admitting: Internal Medicine

## 2020-03-12 DIAGNOSIS — Z1152 Encounter for screening for COVID-19: Secondary | ICD-10-CM | POA: Diagnosis not present

## 2020-03-12 DIAGNOSIS — J019 Acute sinusitis, unspecified: Secondary | ICD-10-CM | POA: Diagnosis not present

## 2020-03-26 ENCOUNTER — Other Ambulatory Visit: Payer: Self-pay | Admitting: Internal Medicine

## 2020-04-18 ENCOUNTER — Encounter: Payer: Self-pay | Admitting: Internal Medicine

## 2020-04-18 MED ORDER — ALPRAZOLAM 0.5 MG PO TABS: ORAL_TABLET | ORAL | 0 refills | Status: DC

## 2020-04-18 MED ORDER — AMPHETAMINE-DEXTROAMPHET ER 20 MG PO CP24: 20.0000 mg | ORAL_CAPSULE | Freq: Every day | ORAL | 0 refills | Status: DC | PRN

## 2020-04-19 ENCOUNTER — Encounter: Payer: Self-pay | Admitting: Internal Medicine

## 2020-04-19 DIAGNOSIS — Z111 Encounter for screening for respiratory tuberculosis: Secondary | ICD-10-CM

## 2020-04-20 ENCOUNTER — Other Ambulatory Visit: Payer: 59

## 2020-04-20 DIAGNOSIS — Z111 Encounter for screening for respiratory tuberculosis: Secondary | ICD-10-CM | POA: Diagnosis not present

## 2020-04-22 LAB — QUANTIFERON-TB GOLD PLUS
Mitogen-NIL: 9.09 IU/mL
NIL: 0.05 IU/mL
QuantiFERON-TB Gold Plus: NEGATIVE
TB1-NIL: <0 IU/mL
TB2-NIL: <0 IU/mL

## 2020-05-08 NOTE — Progress Notes (Signed)
Subjective:    Patient ID: Lisa Duran, female    DOB: [birth year], 39 y.o.   MRN: [id number]  HPI The patient is here for follow up of their chronic medical problems, including DM, dyslipidemia, migraine HAs, fibromyalgia, GERD, ADD, depression, anxiety, obesity   She is exercising regularly-walking.    She has been experiencing some increased depression and anxiety.  She is not sleeping as well.  She had a fibroflare recently that lasted 4 days, but now she does feel better.   Medications and allergies reviewed with patient and updated if appropriate.  Patient Active Problem List   Diagnosis Date Noted  . Dyslipidemia 04/14/2019  . Sepsis secondary to UTI ([place]) 01/12/2019  . Enteritis 01/12/2019  . Sleep walking disorder 04/06/2017  . Iron deficiency anemia 03/27/2017  . Arthralgia 02/03/2017  . Fibromyalgia 02/03/2017  . GERD (gastroesophageal reflux disease) 06/24/2015  . Eczema 06/22/2015  . Depression 06/22/2015  . Tachycardia 06/16/2014  . Paresthesias 06/12/2014  . Food allergy   . ADHD (attention deficit hyperactivity disorder) 01/27/2011  . Anxiety 02/20/2010  . ALLERGIC RHINITIS 11/21/2009  . OVARIAN CYST 11/21/2009  . Diabetes mellitus type 2 with neurological manifestations ([place]) 11/20/2009  . Class 2 obesity in adult 11/20/2009  . Migraine without aura 11/20/2009    Current Outpatient Medications on File Prior to Visit  Medication Sig Dispense Refill  . almotriptan (AXERT) 12.5 MG tablet Take 1 tablet (12.5 mg total) by mouth as needed for migraine. may repeat in 2 hours if needed 12 tablet 3  . ALPRAZolam (XANAX) 0.5 MG tablet TAKE 1 TABLET(0.5 MG) BY MOUTH THREE TIMES DAILY AS NEEDED FOR ANXIETY 60 tablet 0  . amphetamine-dextroamphetamine (ADDERALL XR) 20 MG 24 hr capsule Take 1 capsule (20 mg total) by mouth daily as needed (school or work days). 30 capsule 0  . Continuous Blood Gluc Receiver (FREESTYLE LIBRE 2 READER) DEVI USE AS DIRECTED 1 each 0  .  Continuous Blood Gluc Sensor (FREESTYLE LIBRE 2 SENSOR) MISC 1 each by Does not apply route every 14 (fourteen) days. 6 each 3  . DULoxetine (CYMBALTA) 60 MG capsule TAKE 1 CAPSULE BY MOUTH EVERY DAY 90 capsule 1  . glucose blood (ONETOUCH VERIO) test strip Use 2x a day 200 each 3  . insulin NPH Human (NOVOLIN N) 100 UNIT/ML injection Inject 20-30 Units into the skin. 30 units in am and 20 units at night time    . insulin regular (NOVOLIN R RELION) 100 units/mL injection Inject 0.1-0.16 mLs (10-16 Units total) into the skin 3 (three) times daily before meals. 10 mL 11  . Insulin Syringe-Needle U-100 (B-D INS SYR ULTRAFINE .5CC/30G) 30G X 1/2" 0.5 ML MISC Use to inject insulin once daily. 100 each 11  . levonorgestrel (MIRENA) 20 MCG/24HR IUD 1 Intra Uterine Device (1 each total) by Intrauterine route once. 1 each 0  . metFORMIN (GLUCOPHAGE) 1000 MG tablet Take 2 tablets (2,000 mg total) by mouth daily with supper. TAKE 1 TABLET BY  MOUTH TWICE A DAY WITH A  MEAL 180 tablet 3  . metoprolol succinate (TOPROL-XL) 25 MG 24 hr tablet TAKE 1/2 TABLET BY MOUTH EVERY DAY 45 tablet 1  . omeprazole (PRILOSEC) 40 MG capsule TAKE 1 CAPSULE (40 MG TOTAL) BY MOUTH DAILY BEFORE SUPPER. 90 capsule 1  . ondansetron (ZOFRAN ODT) 4 MG disintegrating tablet Take 1 tablet (4 mg total) by mouth every 8 (eight) hours as needed for nausea or vomiting. 20 tablet 0  .  Prenatal Vit-Fe Fumarate-FA (MULTIVITAMIN-PRENATAL) 27-0.8 MG TABS tablet Take 1 tablet by mouth every other day.     . rosuvastatin (CRESTOR) 5 MG tablet Take 1 tablet (5 mg total) by mouth 2 (two) times a week. 13 tablet 3  . triamcinolone cream (KENALOG) 0.1 % Apply 1 application topically 2 (two) times daily as needed (for itching). 28.4 g 1  . gabapentin (NEURONTIN) 100 MG capsule TAKE 2 CAPSULES (200 MG TOTAL) BY MOUTH AT BEDTIME. (Patient not taking: Reported on 05/09/2020) 180 capsule 1   No current facility-administered medications on file prior to  visit.    Past Medical History:  Diagnosis Date  . ADHD (attention deficit hyperactivity disorder)   . ALLERGIC RHINITIS   . Allergy   . Anemia   . ANXIETY   . Depression   . DIABETES MELLITUS, TYPE II   . Fibromyalgia 2018  . GERD (gastroesophageal reflux disease)   . MIGRAINE, COMMON   . Obesity, unspecified   . OVARIAN CYST     Past Surgical History:  Procedure Laterality Date  . NO PAST SURGERIES      Social History   Socioeconomic History  . Marital status: Single    Spouse name: Not on file  . Number of children: Not on file  . Years of education: Not on file  . Highest education level: Not on file  Occupational History  . Not on file  Tobacco Use  . Smoking status: Former Smoker    Quit date: 08/20/2002    Years since quitting: 17.7  . Smokeless tobacco: Never Used  . Tobacco comment: single, separated from spouse 02/2010.   Vaping Use  . Vaping Use: Never used  Substance and Sexual Activity  . Alcohol use: Yes    Comment: couple of drinks a week  . Drug use: No  . Sexual activity: Not Currently    Birth control/protection: I.U.D.  Other Topics Concern  . Not on file  Social History Narrative   Single, separated from spouse 02/2010   Social Determinants of Health   Financial Resource Strain:   . Difficulty of Paying Living Expenses: Not on file  Food Insecurity:   . Worried About Charity fundraiser in the Last Year: Not on file  . Ran Out of Food in the Last Year: Not on file  Transportation Needs:   . Lack of Transportation (Medical): Not on file  . Lack of Transportation (Non-Medical): Not on file  Physical Activity:   . Days of Exercise per Week: Not on file  . Minutes of Exercise per Session: Not on file  Stress:   . Feeling of Stress : Not on file  Social Connections:   . Frequency of Communication with Friends and Family: Not on file  . Frequency of Social Gatherings with Friends and Family: Not on file  . Attends Religious Services:  Not on file  . Active Member of Clubs or Organizations: Not on file  . Attends Archivist Meetings: Not on file  . Marital Status: Not on file    Family History  Problem Relation Age of Onset  . Diabetes Mother   . Hyperlipidemia Mother   . Diabetes Father   . Hyperlipidemia Father   . Coronary artery disease Father   . Hypertension Father   . Cancer Father        esoph  . Stroke Father   . Cancer Maternal Grandfather 44       Prostate and Lung  .  Diabetes Sister        Gestational  . Berenice Primas' disease Maternal Aunt   . Cancer Maternal Uncle        Brain  . Stroke Paternal Grandmother   . Heart disease Paternal Grandmother   . Stroke Paternal Grandfather   . Heart disease Paternal Grandfather   . Diabetes Sister        Gestational and Type II  . Hashimoto's thyroiditis Cousin   . Cancer Maternal Uncle        Brain    Review of Systems  Constitutional: Negative for chills and fever.  HENT: Positive for rhinorrhea and sneezing (allergies).   Respiratory: Positive for shortness of breath (has been there for a couple of months - intermittent - ? anxiety). Negative for cough and wheezing.   Cardiovascular: Positive for palpitations (with SOB - likely anxiety). Negative for chest pain and leg swelling.  Musculoskeletal: Positive for myalgias (fibro chest pain).  Neurological: Positive for light-headedness and headaches.  Psychiatric/Behavioral: Positive for dysphoric mood. The patient is nervous/anxious.        Objective:   Vitals:   05/09/20 0838  BP: 120/80  Pulse: (!) 106  Temp: 98.5 F (36.9 C)  SpO2: 94%   BP Readings from Last 3 Encounters:  05/09/20 120/80  11/07/19 130/90  11/04/19 126/76   Wt Readings from Last 3 Encounters:  05/09/20 223 lb (101.2 kg)  11/07/19 228 lb (103.4 kg)  11/04/19 228 lb (103.4 kg)   Body mass index is 38.28 kg/m.   Physical Exam    Constitutional: Appears well-developed and well-nourished. No distress.   HENT:  Head: Normocephalic and atraumatic.  Neck: Neck supple. No tracheal deviation present. No thyromegaly present.  No cervical lymphadenopathy Cardiovascular: Normal rate-possibly slightly elevated, regular rhythm and normal heart sounds.   No murmur heard. No carotid bruit .  No edema Pulmonary/Chest: Effort normal and breath sounds normal. No respiratory distress. No has no wheezes. No rales.  Skin: Skin is warm and dry. Not diaphoretic.  Psychiatric: Normal mood and affect. Behavior is normal.      Assessment & Plan:    See Problem List for Assessment and Plan of chronic medical problems.    This visit occurred during the SARS-CoV-2 public health emergency.  Safety protocols were in place, including screening questions prior to the visit, additional usage of staff PPE, and extensive cleaning of exam room while observing appropriate contact time as indicated for disinfecting solutions.

## 2020-05-08 NOTE — Patient Instructions (Addendum)
  Blood work was ordered.   ° ° °Medications reviewed and updated.  Changes include :   none ° ° ° °Please followup in 6 months ° ° °

## 2020-05-09 ENCOUNTER — Encounter: Payer: Self-pay | Admitting: Internal Medicine

## 2020-05-09 ENCOUNTER — Ambulatory Visit (INDEPENDENT_AMBULATORY_CARE_PROVIDER_SITE_OTHER): Payer: 59 | Admitting: Internal Medicine

## 2020-05-09 ENCOUNTER — Other Ambulatory Visit: Payer: Self-pay

## 2020-05-09 VITALS — BP 120/80 | HR 106 | Temp 98.5°F | Wt 223.0 lb

## 2020-05-09 DIAGNOSIS — F900 Attention-deficit hyperactivity disorder, predominantly inattentive type: Secondary | ICD-10-CM

## 2020-05-09 DIAGNOSIS — E785 Hyperlipidemia, unspecified: Secondary | ICD-10-CM

## 2020-05-09 DIAGNOSIS — R69 Illness, unspecified: Secondary | ICD-10-CM | POA: Diagnosis not present

## 2020-05-09 DIAGNOSIS — Z23 Encounter for immunization: Secondary | ICD-10-CM

## 2020-05-09 DIAGNOSIS — G43009 Migraine without aura, not intractable, without status migrainosus: Secondary | ICD-10-CM | POA: Diagnosis not present

## 2020-05-09 DIAGNOSIS — Z6839 Body mass index (BMI) 39.0-39.9, adult: Secondary | ICD-10-CM | POA: Diagnosis not present

## 2020-05-09 DIAGNOSIS — M797 Fibromyalgia: Secondary | ICD-10-CM

## 2020-05-09 DIAGNOSIS — E1149 Type 2 diabetes mellitus with other diabetic neurological complication: Secondary | ICD-10-CM | POA: Diagnosis not present

## 2020-05-09 DIAGNOSIS — K219 Gastro-esophageal reflux disease without esophagitis: Secondary | ICD-10-CM

## 2020-05-09 DIAGNOSIS — F3289 Other specified depressive episodes: Secondary | ICD-10-CM

## 2020-05-09 DIAGNOSIS — R000 Tachycardia, unspecified: Secondary | ICD-10-CM

## 2020-05-09 DIAGNOSIS — F419 Anxiety disorder, unspecified: Secondary | ICD-10-CM

## 2020-05-09 MED ORDER — GABAPENTIN 100 MG PO CAPS: 200.0000 mg | ORAL_CAPSULE | Freq: Every day | ORAL | 1 refills | Status: DC

## 2020-05-09 NOTE — Assessment & Plan Note (Addendum)
Chronic Somewhat but could be better - may improve within the next week Continue cymbalta 60 mg daily-she does not want to make any changes now Continue regular exercise

## 2020-05-09 NOTE — Assessment & Plan Note (Signed)
Chronic Fairly controlled with metoprolol 25 mg XL daily

## 2020-05-09 NOTE — Assessment & Plan Note (Signed)
Chronic Check lipid panel  Continue Crestor 5 mg daily Regular exercise and healthy diet encouraged  

## 2020-05-09 NOTE — Assessment & Plan Note (Signed)
Chronic GERD controlled until last week and it flared for a few days -- has calmed down now Continue omeprazole 40 mg daily If GERD flares - increase omeprazole to BID for short time and then decrease back to daily

## 2020-05-09 NOTE — Assessment & Plan Note (Signed)
Chronic Not ideally controlled At this time she does not want to increase her medication or make any changes Meditation on a regular basis does help and she will make sure she is more religious with this Continue regular exercise Hopefully when she gets past the orientation of her new job and becomes more accustomed to it her anxiety will naturally improve If her anxiety does not improve over the next few weeks she will let me know

## 2020-05-09 NOTE — Assessment & Plan Note (Signed)
Chronic Management per Dr Gherghe 

## 2020-05-09 NOTE — Assessment & Plan Note (Addendum)
Chronic Controlled, more tension headaches Working on avoiding foods she is allergic to, which are a trigger  Taking axert 12.5 mg as needed or excedrin otc Continue above

## 2020-05-09 NOTE — Assessment & Plan Note (Signed)
Chronic Encouraged her to continue her regular walking and increase if possible Discussed healthy diet, decrease portions

## 2020-05-09 NOTE — Assessment & Plan Note (Signed)
Chronic Controlled, stable w/o side effects Continue Adderall XR 20 mg daily

## 2020-05-09 NOTE — Assessment & Plan Note (Signed)
Chronic Overall fairly controlled, but did have a recent flare likely secondary to stress Has not been taking the gabapentin so we will restart because that does help Restart gabapentin 200 mg at night Continue Cymbalta 60 mg daily Continue regular exercise She is working on stress management

## 2020-05-10 LAB — COMPLETE METABOLIC PANEL WITH GFR
AG Ratio: 1.3 (calc) (ref 1.0–2.5)
ALT: 33 U/L — ABNORMAL HIGH (ref 6–29)
AST: 28 U/L (ref 10–30)
Albumin: 4.4 g/dL (ref 3.6–5.1)
Alkaline phosphatase (APISO): 161 U/L — ABNORMAL HIGH (ref 31–125)
BUN: 12 mg/dL (ref 7–25)
CO2: 24 mmol/L (ref 20–32)
Calcium: 9.8 mg/dL (ref 8.6–10.2)
Chloride: 98 mmol/L (ref 98–110)
Creat: 0.86 mg/dL (ref 0.50–1.10)
GFR, Est African American: 97 mL/min/{1.73_m2} (ref >=60)
GFR, Est Non African American: 84 mL/min/{1.73_m2} (ref >=60)
Globulin: 3.5 g/dL (calc) (ref 1.9–3.7)
Glucose, Bld: 335 mg/dL — ABNORMAL HIGH (ref 65–99)
Potassium: 4.5 mmol/L (ref 3.5–5.3)
Sodium: 135 mmol/L (ref 135–146)
Total Bilirubin: 0.5 mg/dL (ref 0.2–1.2)
Total Protein: 7.9 g/dL (ref 6.1–8.1)

## 2020-05-10 LAB — CBC WITH DIFFERENTIAL/PLATELET
Absolute Monocytes: 657 cells/uL (ref 200–950)
Basophils Absolute: 81 cells/uL (ref 0–200)
Basophils Relative: 0.8 %
Eosinophils Absolute: 1050 cells/uL — ABNORMAL HIGH (ref 15–500)
Eosinophils Relative: 10.4 %
HCT: 39.4 % (ref 35.0–45.0)
Hemoglobin: 11.1 g/dL — ABNORMAL LOW (ref 11.7–15.5)
Lymphs Abs: 2141 cells/uL (ref 850–3900)
MCH: 19.6 pg — ABNORMAL LOW (ref 27.0–33.0)
MCHC: 28.2 g/dL — ABNORMAL LOW (ref 32.0–36.0)
MCV: 69.7 fL — ABNORMAL LOW (ref 80.0–100.0)
MPV: 8.7 fL (ref 7.5–12.5)
Monocytes Relative: 6.5 %
Neutro Abs: 6171 cells/uL (ref 1500–7800)
Neutrophils Relative %: 61.1 %
Platelets: 509 10*3/uL — ABNORMAL HIGH (ref 140–400)
RBC: 5.65 10*6/uL — ABNORMAL HIGH (ref 3.80–5.10)
RDW: 18.2 % — ABNORMAL HIGH (ref 11.0–15.0)
Total Lymphocyte: 21.2 %
WBC: 10.1 10*3/uL (ref 3.8–10.8)

## 2020-05-10 LAB — MICROALBUMIN / CREATININE URINE RATIO
Creatinine, Urine: 153 mg/dL (ref 20–275)
Microalb Creat Ratio: 28 mcg/mg creat (ref <30)
Microalb, Ur: 4.3 mg/dL

## 2020-05-10 LAB — LIPID PANEL
Cholesterol: 183 mg/dL (ref <200)
HDL: 59 mg/dL (ref >=50)
LDL Cholesterol (Calc): 102 mg/dL (calc) — ABNORMAL HIGH
Non-HDL Cholesterol (Calc): 124 mg/dL (calc) (ref <130)
Total CHOL/HDL Ratio: 3.1 (calc) (ref <5.0)
Triglycerides: 128 mg/dL (ref <150)

## 2020-05-10 LAB — TSH: TSH: 4.07 mIU/L

## 2020-06-01 ENCOUNTER — Ambulatory Visit: Payer: 59 | Admitting: Internal Medicine

## 2020-06-03 ENCOUNTER — Other Ambulatory Visit: Payer: Self-pay | Admitting: Internal Medicine

## 2020-06-03 DIAGNOSIS — E1149 Type 2 diabetes mellitus with other diabetic neurological complication: Secondary | ICD-10-CM

## 2020-06-05 ENCOUNTER — Encounter: Payer: Self-pay | Admitting: Internal Medicine

## 2020-06-05 DIAGNOSIS — T8052XA Anaphylactic reaction due to vaccination, initial encounter: Secondary | ICD-10-CM

## 2020-06-08 ENCOUNTER — Telehealth: Payer: Self-pay | Admitting: Allergy & Immunology

## 2020-06-08 NOTE — Telephone Encounter (Signed)
Referral sent over from Dr. Billey Gosling for anaphylaxis due to vaccination. Left voicemail for patient to call back to discuss. Per notes, "Had anaphylactic reaction to first moderna.  Would like to discuss options if any for her to be vaccinated"  Please advise if component testing can be scheduled if interested.

## 2020-06-08 NOTE — Telephone Encounter (Signed)
That is fine - just schedule her for component testing.   Salvatore Marvel, MD [facility]

## 2020-06-11 NOTE — Telephone Encounter (Signed)
Left voicemail for patient to call back to discuss component testing.

## 2020-06-12 ENCOUNTER — Other Ambulatory Visit: Payer: Self-pay

## 2020-06-12 ENCOUNTER — Encounter: Payer: Self-pay | Admitting: Internal Medicine

## 2020-06-12 ENCOUNTER — Ambulatory Visit: Payer: BC Managed Care – PPO | Admitting: Internal Medicine

## 2020-06-12 VITALS — BP 124/82 | HR 125 | Ht 64.0 in | Wt 227.4 lb

## 2020-06-12 DIAGNOSIS — E1149 Type 2 diabetes mellitus with other diabetic neurological complication: Secondary | ICD-10-CM

## 2020-06-12 DIAGNOSIS — E785 Hyperlipidemia, unspecified: Secondary | ICD-10-CM

## 2020-06-12 DIAGNOSIS — Z6839 Body mass index (BMI) 39.0-39.9, adult: Secondary | ICD-10-CM | POA: Diagnosis not present

## 2020-06-12 LAB — POCT GLYCOSYLATED HEMOGLOBIN (HGB A1C): Hemoglobin A1C: 8.6 % — AB (ref 4.0–5.6)

## 2020-06-12 NOTE — Patient Instructions (Addendum)
Please continue: - Metformin 2000 mg with dinner  Please change: - NPH 40 units and 24 units at bedtime - Regular insulin 15-20 units (use the lower doses with b'fast and lunch and higher before dinner)  30 min before the 3 main meals.  Please return in 3-4 months with your sugar log.

## 2020-06-12 NOTE — Progress Notes (Signed)
Subjective:     Patient ID: Lisa Duran, female   DOB: 05/27/1981, 39 y.o.   MRN: [id number]  This visit occurred during the SARS-CoV-2 public health emergency.  Safety protocols were in place, including screening questions prior to the visit, additional usage of staff PPE, and extensive cleaning of exam room while observing appropriate contact time as indicated for disinfecting solutions.   HPI Lisa Duran is a pleasant 39 y.o. woman returning for f/u for DM2, insulin dependent, uncontrolled, dx 52/7782, w/o complications. Last visit 6 months ago.  She was laid off from work after our last visit I was looking forward to work for a while.  She now has a new job for last 1 mo >> less stressful.  Reviewed HbA1c levels: Lab Results  Component Value Date   HGBA1C 8.5 (A) 11/07/2019   HGBA1C 8.4 (A) 08/09/2019   HGBA1C 9.1 (A) 04/14/2019   HGBA1C 9.0 (H) 10/20/2018   HGBA1C 7.2 (A) 05/27/2018   HGBA1C 9.4 (A) 01/22/2018   HGBA1C 6.2 05/25/2017   HGBA1C 7.6 12/25/2016   HGBA1C 9.5 08/19/2016   HGBA1C 8.6 (H) 12/20/2015   She is on:   - Metformin 1000 mg 2x a day >> 2000 mg with dinner or at bedtime - Lantus 32 >> Levemir 60 units at bedtime >> NPH 30 units 2x a day - NovoLog 8-10 >> 10-16 >> Regular  15-20 units 3 times a day before meals We stopped glipizide ER and also Januvia. We tried Cycloset 0.8 mg daily >> stopped end of 11/2016 b/c GERD/AP/C We tried Trulicity >> could not tolerate it: N/V/AP. She restarted Januvia 09/2016. She was on Invokana 100 mg in am (started 12/2014) >> 2 yeast inf >> tx with Diflucan; she was exhausted and had nocturia >> stopped 03/12/2015 Tried Victoza 2 mo ago >> AP, severe GERD, inj site rxn Tried Januvia >> tolerated it well, but stopped when Invokana was suggested.  She was also on Actos, but taken off b/c good control. She had problems tolerating insulins in the past.  Basaglar and [place] caused eczema and also GI symptoms.  She has a [employer].  She checks sugars 4 times a day per meter download - still fluctuating: - am: 118, 152-169, 253 >> 118-263, 311 >> 80-181, 203, w/o metformin: 230-260  Pt.checks her sugars more than 4 times a day with his CGM.  Freestyle libre 2 CGM parameters: - Average: 165 - % active CGM time: 64% of the time - Glucose variability 36.6% (target < or = to 36%) - time in range:  - very low (<54): 0% - low (54-69): 1% - normal range (70-180): 63% - high sugars (181-250): 26% - very high sugars (>250): 10% Lowest 57 (CGM). She has hypoglycemia awareness in the 80s.    No CKD: Lab Results  Component Value Date   BUN 12 05/09/2020   Lab Results  Component Value Date   CREATININE 0.86 05/09/2020   No MAU: Lab Results  Component Value Date   MICRALBCREAT 28 05/09/2020   MICRALBCREAT 0.8 10/20/2018   MICRALBCREAT 0.5 02/03/2017   MICRALBCREAT 0.7 12/20/2015   MICRALBCREAT 0.8 12/19/2014   -+ HL: Lab Results  Component Value Date   CHOL 183 05/09/2020   HDL 59 05/09/2020   LDLCALC 102 (H) 05/09/2020   TRIG 128 05/09/2020   CHOLHDL 3.1 05/09/2020  Not on a statin.  Last eye exam 05/2019: No [facility]. She has numbness  and tingling in hand and feet.  She feels much better on Cymbalta and Neurontin.  She had CP >> had a stress test 07/04/2015 >>  Normal.  She is on metoprolol for 5 heart rate. She has fibromyalgia.   She also has a history of iron deficiency anemia and had iron infusions.  She is also on B12 tablets. In 2019, she just got out of a 5-year relationship.  She was under a lot of stress.   She is on Cymbalta for anxiety.  Review of Systems Constitutional: no weight gain/no weight loss, no fatigue, no subjective hyperthermia, no subjective hypothermia Eyes: no blurry vision, no xerophthalmia ENT: no sore throat, no nodules palpated in neck, no dysphagia, no odynophagia, no hoarseness Cardiovascular: no CP/no SOB/no  palpitations/no leg swelling Respiratory: no cough/no SOB/no wheezing Gastrointestinal: no N/no V/no D/no C/no acid reflux Musculoskeletal: no muscle aches/no joint aches Skin: no rashes, no hair loss Neurological: no tremors/+ numbness/+ tingling/no dizziness  I reviewed pt's medications, allergies, PMH, social hx, family hx, and changes were documented in the history of present illness. Otherwise, unchanged from my initial visit note.  Past Medical History:  Diagnosis Date  . ADHD (attention deficit hyperactivity disorder)   . ALLERGIC RHINITIS   . Allergy   . Anemia   . ANXIETY   . Depression   . DIABETES MELLITUS, TYPE II   . Fibromyalgia 2018  . GERD (gastroesophageal reflux disease)   . MIGRAINE, COMMON   . Obesity, unspecified   . OVARIAN CYST    Past Surgical History:  Procedure Laterality Date  . NO PAST SURGERIES     Social History   Socioeconomic History  . Marital status: Single    Spouse name: Not on file  . Number of children: Not on file  . Years of education: Not on file  . Highest education level: Not on file  Occupational History  . Not on file  Tobacco Use  . Smoking status: Former Smoker    Quit date: 08/20/2002    Years since quitting: 17.8  . Smokeless tobacco: Never Used  . Tobacco comment: single, separated from spouse 02/2010.   Vaping Use  . Vaping Use: Never used  Substance and Sexual Activity  . Alcohol use: Yes    Comment: couple of drinks a week  . Drug use: No  . Sexual activity: Not Currently    Birth control/protection: I.U.D.  Other Topics Concern  . Not on file  Social History Narrative   Single, separated from spouse 02/2010   Social Determinants of Health   Financial Resource Strain:   . Difficulty of Paying Living Expenses: Not on file  Food Insecurity:   . Worried About Charity fundraiser in the Last Year: Not on file  . Ran Out of Food in the Last Year: Not on file  Transportation Needs:   . Lack of Transportation  (Medical): Not on file  . Lack of Transportation (Non-Medical): Not on file  Physical Activity:   . Days of Exercise per Week: Not on file  . Minutes of Exercise per Session: Not on file  Stress:   . Feeling of Stress : Not on file  Social Connections:   . Frequency of Communication with Friends and Family: Not on file  . Frequency of Social Gatherings with Friends and Family: Not on file  . Attends Religious Services: Not on file  . Active Member of Clubs or Organizations: Not on file  . Attends Club  or Organization Meetings: Not on file  . Marital Status: Not on file  Intimate Partner Violence:   . Fear of Current or Ex-Partner: Not on file  . Emotionally Abused: Not on file  . Physically Abused: Not on file  . Sexually Abused: Not on file   Current Outpatient Medications on File Prior to Visit  Medication Sig Dispense Refill  . almotriptan (AXERT) 12.5 MG tablet Take 1 tablet (12.5 mg total) by mouth as needed for migraine. may repeat in 2 hours if needed 12 tablet 3  . ALPRAZolam (XANAX) 0.5 MG tablet TAKE 1 TABLET(0.5 MG) BY MOUTH THREE TIMES DAILY AS NEEDED FOR ANXIETY 60 tablet 0  . amphetamine-dextroamphetamine (ADDERALL XR) 20 MG 24 hr capsule Take 1 capsule (20 mg total) by mouth daily as needed (school or work days). 30 capsule 0  . Continuous Blood Gluc Receiver (FREESTYLE LIBRE 2 READER) DEVI USE AS DIRECTED 1 each 0  . Continuous Blood Gluc Sensor (FREESTYLE LIBRE 2 SENSOR) MISC 1 each by Does not apply route every 14 (fourteen) days. 6 each 3  . DULoxetine (CYMBALTA) 60 MG capsule TAKE 1 CAPSULE BY MOUTH EVERY DAY 90 capsule 1  . gabapentin (NEURONTIN) 100 MG capsule Take 2 capsules (200 mg total) by mouth at bedtime. 180 capsule 1  . glucose blood (ONETOUCH VERIO) test strip Use 2x a day 200 each 3  . insulin NPH Human (NOVOLIN N) 100 UNIT/ML injection Inject 20-30 Units into the skin. 30 units in am and 20 units at night time    . insulin regular (NOVOLIN R RELION) 100  units/mL injection Inject 0.1-0.16 mLs (10-16 Units total) into the skin 3 (three) times daily before meals. 10 mL 11  . Insulin Syringe-Needle U-100 (B-D INS SYR ULTRAFINE .5CC/30G) 30G X 1/2" 0.5 ML MISC Use to inject insulin once daily. 100 each 11  . levonorgestrel (MIRENA) 20 MCG/24HR IUD 1 Intra Uterine Device (1 each total) by Intrauterine route once. 1 each 0  . metFORMIN (GLUCOPHAGE) 1000 MG tablet TAKE 2 TABLETS BY MOUTH DAILY WITH SUPPER. TAKE 1 TABLET BY MOUTH TWICE A DAY WITH A MEAL 180 tablet 3  . metoprolol succinate (TOPROL-XL) 25 MG 24 hr tablet TAKE 1/2 TABLET BY MOUTH EVERY DAY 45 tablet 1  . omeprazole (PRILOSEC) 40 MG capsule TAKE 1 CAPSULE (40 MG TOTAL) BY MOUTH DAILY BEFORE SUPPER. 90 capsule 1  . ondansetron (ZOFRAN ODT) 4 MG disintegrating tablet Take 1 tablet (4 mg total) by mouth every 8 (eight) hours as needed for nausea or vomiting. 20 tablet 0  . Prenatal Vit-Fe Fumarate-FA (MULTIVITAMIN-PRENATAL) 27-0.8 MG TABS tablet Take 1 tablet by mouth every other day.     . rosuvastatin (CRESTOR) 5 MG tablet Take 1 tablet (5 mg total) by mouth 2 (two) times a week. 13 tablet 3  . triamcinolone cream (KENALOG) 0.1 % Apply 1 application topically 2 (two) times daily as needed (for itching). 28.4 g 1   No current facility-administered medications on file prior to visit.   Allergies  Allergen Reactions  . Chicken Allergy Anaphylaxis and Shortness Of Breath  . Covid-19 Mrna Vacc (Moderna) Anaphylaxis  . Other Itching, Swelling and Other (See Comments)    NUTS (of ANY kind): Lips and mouth swell, but no breathing impairment & migraines and eczema are triggered  . Propofol Shortness Of Breath    SOB, chest tightness, wheezing after colonoscopy on 09-18-15  . Basaglar Kwikpen [Insulin Glargine] Palpitations and Hypertension  . Adhesive [Tape]  Other (See Comments)    Makes the skin VERY RED   . Gluten Meal Diarrhea  . Peanut-Containing Drug Products Itching, Swelling and Other (See  Comments)    Lips and mouth swell, but no breathing impairment & migraines and eczema are triggered   . Novolog [Insulin Aspart] Rash  . Tyler Aas [Insulin Degludec] Rash    eczema  . Victoza [Liraglutide] Rash   Family History  Problem Relation Age of Onset  . Diabetes Mother   . Hyperlipidemia Mother   . Diabetes Father   . Hyperlipidemia Father   . Coronary artery disease Father   . Hypertension Father   . Cancer Father        esoph  . Stroke Father   . Cancer Maternal Grandfather 43       Prostate and Lung  . Diabetes Sister        Gestational  . Berenice Primas' disease Maternal Aunt   . Cancer Maternal Uncle        Brain  . Stroke Paternal Grandmother   . Heart disease Paternal Grandmother   . Stroke Paternal Grandfather   . Heart disease Paternal Grandfather   . Diabetes Sister        Gestational and Type II  . Hashimoto's thyroiditis Cousin   . Cancer Maternal Uncle        Brain       Objective:   Physical Exam BP 124/82   Pulse (!) 125   Ht 5\' 4"  (1.626 m)   Wt 227 lb 6.4 oz (103.1 kg)   SpO2 99%   BMI 39.03 kg/m   Wt Readings from Last 3 Encounters:  06/12/20 227 lb 6.4 oz (103.1 kg)  05/09/20 223 lb (101.2 kg)  11/07/19 228 lb (103.4 kg)   Constitutional: overweight, in NAD Eyes: PERRLA, EOMI, no exophthalmos ENT: moist mucous membranes, no thyromegaly, no cervical lymphadenopathy Cardiovascular: Tachycardia, RR, No MRG Respiratory: CTA B Gastrointestinal: abdomen soft, NT, ND, BS+ Musculoskeletal: no deformities, strength intact in all 4 Skin: moist, warm, no rashes Neurological: no tremor with outstretched hands, DTR normal in all 4   Assessment:     1. DM2, insulin-dependent, uncontrolled, without complications - r/o autoimmunity Component     Latest Ref Rng 08/17/2012  Glutamic Acid Decarb Ab     <=1.0 U/mL <1.0  Pancreatic Islet Cell Antibody     < 5 JDF Units <5  C-Peptide     0.80 - 3.90 ng/mL 1.84  Glucose     70 - 99 mg/dL 104 (H)      2. Obesity class II BMI Classification:  < 18.5 underweight   18.5-24.9 normal weight   25.0-29.9 overweight   30.0-34.9 class I obesity   35.0-39.9 class II obesity   ? 40.0 class III obesity   3. HL  Plan:     1. Patient with longstanding, uncontrolled, type 2 diabetes, on Metformin and basal-bolus insulin regimen with still poor control.  Unfortunately, we cannot use SGLT2 inhibitors and GLP-1 receptor agonists due to previous intolerance.  We also had to switch from NovoLog to regular insulin due to rash.  She has intolerances to other insulins so we have to continue with NPH and regular insulin for now.  She obtained a freestyle libre 2 CGM since last visit. CGM interpretation: -At this visit, we downloaded her CGM and reviewed the tracings together.  63% of her blood sugars are in target range and 36% or higher.  Her glucose management indicator  is 7.3%, indicating her next HbA1c projected from the last 2 weeks.  Her sugars decreased as the day goes by after an initial increase from 3 AM to approximately 8 AM.  It appears that her sugars are higher after dinner, but they do drop during the night sometimes even to the 50s (per CGM recording).  Therefore, I believe that some of her high blood sugars in the morning are due to rebound hyperglycemia after an initial low.  However, others are due to missing Metformin the night before.  In the situations, sugars are in the 230s or even higher.  We discussed about trying to move Metformin with dinner.  Due to the decrease in blood sugars overnight, we will decrease the dose of NPH at night and since that sugars are high during the day, will increase the NPH dose in the morning.  Since that sugars decrease as the day goes by, I advised her to back off her regular insulin and use the lower doses recommended for the first 2 meals of the day and a higher dose for dinner since sugars after dinner are usually higher. - I suggested to:  Patient  Instructions  Please continue: - Metformin 2000 mg with dinner  Please change: - NPH 40 units and 24 units at bedtime - Regular insulin 15-20 units (use the lower doses with b'fast and lunch and higher before dinner)  30 min before the 3 main meals.  Please return in 3-4 months with your sugar log.   - we checked her HbA1c: 8.6% (slightly higher) - advised to check sugars at different times of the day - 4x a day, rotating check times - advised for yearly eye exams >> she is UTD - return to clinic in 3-4 months    2. Obesity class II -She gained 6 pounds before last visit -Will continue Metformin, which is weight stabilizing long-term -Unfortunately, we cannot use SGLT2 inhibitor and GLP-1 receptor agonists, which would have helped with weight loss, due to previous intolerance  3. HL -Reviewed latest lipid panel from 04/2020: LDL only slightly above goal: Lab Results  Component Value Date   CHOL 183 05/09/2020   HDL 59 05/09/2020   LDLCALC 102 (H) 05/09/2020   TRIG 128 05/09/2020   CHOLHDL 3.1 05/09/2020  -She is not on a statin  Philemon Kingdom, MD PhD [facility] Endocrinology

## 2020-06-27 ENCOUNTER — Other Ambulatory Visit: Payer: Self-pay | Admitting: Internal Medicine

## 2020-07-16 ENCOUNTER — Encounter: Payer: Self-pay | Admitting: Internal Medicine

## 2020-07-17 MED ORDER — AMPHETAMINE-DEXTROAMPHET ER 20 MG PO CP24: 20.0000 mg | ORAL_CAPSULE | Freq: Every day | ORAL | 0 refills | Status: DC | PRN

## 2020-07-17 MED ORDER — ALPRAZOLAM 0.5 MG PO TABS: ORAL_TABLET | ORAL | 0 refills | Status: DC

## 2020-07-26 ENCOUNTER — Ambulatory Visit: Payer: Self-pay | Admitting: Allergy

## 2020-07-27 ENCOUNTER — Encounter: Payer: Self-pay | Admitting: Allergy

## 2020-07-27 DIAGNOSIS — I10 Essential (primary) hypertension: Secondary | ICD-10-CM | POA: Diagnosis not present

## 2020-07-27 DIAGNOSIS — H52223 Regular astigmatism, bilateral: Secondary | ICD-10-CM | POA: Diagnosis not present

## 2020-07-27 DIAGNOSIS — H35033 Hypertensive retinopathy, bilateral: Secondary | ICD-10-CM | POA: Diagnosis not present

## 2020-07-27 LAB — HM DIABETES EYE EXAM

## 2020-08-08 ENCOUNTER — Telehealth: Payer: BC Managed Care – PPO | Admitting: Emergency Medicine

## 2020-08-08 DIAGNOSIS — J329 Chronic sinusitis, unspecified: Secondary | ICD-10-CM

## 2020-08-08 MED ORDER — AMOXICILLIN-POT CLAVULANATE 875-125 MG PO TABS: 1.0000 | ORAL_TABLET | Freq: Two times a day (BID) | ORAL | 0 refills | Status: DC

## 2020-08-08 MED ORDER — FLUCONAZOLE 150 MG PO TABS: ORAL_TABLET | ORAL | 0 refills | Status: DC

## 2020-08-08 NOTE — Progress Notes (Signed)
We are sorry that you are not feeling well.  Here is how we plan to help!  Based on what you have shared with me it looks like you have sinusitis.  Sinusitis is inflammation and infection in the sinus cavities of the head.  Based on your presentation I believe you most likely have Acute Bacterial Sinusitis.  This is an infection caused by bacteria and is treated with antibiotics. I have prescribed Augmentin 875mg /125mg  one tablet twice daily with food, for 7 days. You may use an oral decongestant such as Mucinex D or if you have glaucoma or high blood pressure use plain Mucinex. Saline nasal spray help and can safely be used as often as needed for congestion.  If you develop worsening sinus pain, fever or notice severe headache and vision changes, or if symptoms are not better after completion of antibiotic, please schedule an appointment with a health care provider.    Sinus infections are not as easily transmitted as other respiratory infection, however we still recommend that you avoid close contact with loved ones, especially the very young and elderly.  Remember to wash your hands thoroughly throughout the day as this is the number one way to prevent the spread of infection!  I've also sent for 2 tablets of Diflucan.  Take 1 tab midway through the antibiotic courses and 1 tab after finishing the antibiotc  Home Care:  Only take medications as instructed by your medical team.  Complete the entire course of an antibiotic.  Do not take these medications with alcohol.  A steam or ultrasonic humidifier can help congestion.  You can place a towel over your head and breathe in the steam from hot water coming from a faucet.  Avoid close contacts especially the very young and the elderly.  Cover your mouth when you cough or sneeze.  Always remember to wash your hands.  Get Help Right Away If:  You develop worsening fever or sinus pain.  You develop a severe head ache or visual  changes.  Your symptoms persist after you have completed your treatment plan.  Make sure you  Understand these instructions.  Will watch your condition.  Will get help right away if you are not doing well or get worse.  Your e-visit answers were reviewed by a board certified advanced clinical practitioner to complete your personal care plan.  Depending on the condition, your plan could have included both over the counter or prescription medications.  If there is a problem please reply  once you have received a response from your provider.  Your safety is important to .  If you have drug allergies check your prescription carefully.    You can use MyChart to ask questions about today's visit, request a non-urgent call back, or ask for a work or school excuse for 24 hours related to this e-Visit. If it has been greater than 24 hours you will need to follow up with your provider, or enter a new e-Visit to address those concerns.  You will get an e-mail in the next two days asking about your experience.  I hope that your e-visit has been valuable and will speed your recovery. Thank you for using e-visits.   Approximately 5 minutes was used in reviewing the patient's chart, questionnaire, prescribing medications, and documentation.

## 2020-09-07 ENCOUNTER — Encounter: Payer: Self-pay | Admitting: Internal Medicine

## 2020-09-07 NOTE — Progress Notes (Signed)
Outside notes received. Information abstracted. Notes sent to scan.  

## 2020-09-11 ENCOUNTER — Other Ambulatory Visit: Payer: Self-pay | Admitting: Internal Medicine

## 2020-09-12 ENCOUNTER — Encounter: Payer: Self-pay | Admitting: Internal Medicine

## 2020-09-13 MED ORDER — AMPHETAMINE-DEXTROAMPHET ER 20 MG PO CP24: 20.0000 mg | ORAL_CAPSULE | Freq: Every day | ORAL | 0 refills | Status: DC | PRN

## 2020-10-08 ENCOUNTER — Encounter: Payer: Self-pay | Admitting: Internal Medicine

## 2020-10-09 MED ORDER — DULOXETINE HCL 30 MG PO CPEP: 30.0000 mg | ORAL_CAPSULE | Freq: Every day | ORAL | 1 refills | Status: DC

## 2020-10-09 NOTE — Addendum Note (Signed)
Addended by: Binnie Rail on: 10/09/2020 07:20 AM   Modules accepted: Orders

## 2020-10-16 ENCOUNTER — Other Ambulatory Visit: Payer: Self-pay

## 2020-10-16 ENCOUNTER — Ambulatory Visit: Payer: BC Managed Care – PPO | Admitting: Internal Medicine

## 2020-10-16 ENCOUNTER — Encounter: Payer: Self-pay | Admitting: Internal Medicine

## 2020-10-16 VITALS — BP 120/82 | HR 116 | Ht 64.0 in | Wt 235.4 lb

## 2020-10-16 DIAGNOSIS — Z6839 Body mass index (BMI) 39.0-39.9, adult: Secondary | ICD-10-CM

## 2020-10-16 DIAGNOSIS — E785 Hyperlipidemia, unspecified: Secondary | ICD-10-CM

## 2020-10-16 DIAGNOSIS — E1149 Type 2 diabetes mellitus with other diabetic neurological complication: Secondary | ICD-10-CM | POA: Diagnosis not present

## 2020-10-16 LAB — POCT GLYCOSYLATED HEMOGLOBIN (HGB A1C): Hemoglobin A1C: 7.6 % — AB (ref 4.0–5.6)

## 2020-10-16 NOTE — Progress Notes (Signed)
Subjective:     Patient ID: Lisa Duran, female   DOB: [birth year], 40 y.o.   MRN: [id number]  This visit occurred during the SARS-CoV-2 public health emergency.  Safety protocols were in place, including screening questions prior to the visit, additional usage of staff PPE, and extensive cleaning of exam room while observing appropriate contact time as indicated for disinfecting solutions.   HPI Lisa Duran is a pleasant 40 y.o. woman returning for f/u for DM2, insulin dependent, uncontrolled, dx 17/6160, w/o complications. Last visit 4 months ago.  At this visit, she tells me that she is not feeling well: Not sleeping well because her sugars are dropping at night, and they are very fluctuating during the day; she has nausea, vomiting, abdominal pain and she felt that this may be related to the fluctuations in her blood sugars.  Reviewed HbA1c levels: Lab Results  Component Value Date   HGBA1C 8.6 (A) 06/12/2020   HGBA1C 8.5 (A) 11/07/2019   HGBA1C 8.4 (A) 08/09/2019   HGBA1C 9.1 (A) 04/14/2019   HGBA1C 9.0 (H) 10/20/2018   HGBA1C 7.2 (A) 05/27/2018   HGBA1C 9.4 (A) 01/22/2018   HGBA1C 6.2 05/25/2017   HGBA1C 7.6 12/25/2016   HGBA1C 9.5 08/19/2016   She is on:   - Metformin 1000 mg 2x a day >> 2000 mg with dinner - Lantus 32 >> Levemir 60 units at bedtime >> NPH 30 units 2x a day >> 40 units in a.m. and 22-24 units at bedtime - NovoLog 8-10 >> 10-16 >> Regular  15-17 units 3 times a day before meals We stopped glipizide ER and also Januvia. We tried Cycloset 0.8 mg daily >> stopped end of 11/2016 b/c GERD/AP/C We tried Trulicity >> could not tolerate it: N/V/AP. She restarted Januvia 09/2016. She was on Invokana 100 mg in am (started 12/2014) >> 2 yeast inf >> tx with Diflucan; she was exhausted and had nocturia >> stopped 03/12/2015 Tried Victoza 2 mo ago >> AP, severe GERD, inj site rxn Tried Januvia >> tolerated it well, but stopped when Invokana was suggested.  She was also on  Actos, but taken off b/c good control. She had problems tolerating insulins in the past.  Basaglar and [place] caused eczema and also GI symptoms.  She has a [employer], structural.  She checks her sugars more than 4 times a day with her CGM.  Freestyle libre 2 CGM parameters:  Lowest 57 (CGM) >> 53 She has hypoglycemia awareness in the 80s.        Previously:   No CKD: Lab Results  Component Value Date   BUN 12 05/09/2020   Lab Results  Component Value Date   CREATININE 0.86 05/09/2020   No MAU: Lab Results  Component Value Date   MICRALBCREAT 28 05/09/2020   MICRALBCREAT 0.8 10/20/2018   MICRALBCREAT 0.5 02/03/2017   MICRALBCREAT 0.7 12/20/2015   MICRALBCREAT 0.8 12/19/2014   + HL: Lab Results  Component Value Date   CHOL 183 05/09/2020   HDL 59 05/09/2020   LDLCALC 102 (H) 05/09/2020   TRIG 128 05/09/2020   CHOLHDL 3.1 05/09/2020  Not on a statin  Last eye exam 2022: reportedly No DR- [place] [facility] in [facility]. + Numbness and tingling in hand and feet.  She feels much better on Cymbalta and Neurontin.  She had CP >> had a stress test 07/04/2015 >>  Normal.  She is on metoprolol for tachycardia. She has fibromyalgia.   She also has a  history of iron deficiency anemia and had iron infusions.  She is also on B12 tablets. In 2019, she just got out of a 5-year relationship.  She was under a lot of stress.   She is on Cymbalta for anxiety.  Review of Systems Constitutional: + weight gain/no weight loss, no fatigue, no subjective hyperthermia, no subjective hypothermia, + insomnia, + nocturia Eyes: no blurry vision, no xerophthalmia ENT: no sore throat, no nodules palpated in neck, no dysphagia, no odynophagia, no hoarseness Cardiovascular: no CP/no SOB/no palpitations/no leg swelling Respiratory: no cough/no SOB/no wheezing Gastrointestinal: + N/+ V/no D/no C/no acid reflux/+ AP/+ bloating Musculoskeletal: no muscle aches/no joint aches Skin: no  rashes, no hair loss Neurological: no tremors/+ numbness/+ tingling/no dizziness  I reviewed pt's medications, allergies, PMH, social hx, family hx, and changes were documented in the history of present illness. Otherwise, unchanged from my initial visit note.  Past Medical History:  Diagnosis Date  . ADHD (attention deficit hyperactivity disorder)   . ALLERGIC RHINITIS   . Allergy   . Anemia   . ANXIETY   . Depression   . DIABETES MELLITUS, TYPE II   . Fibromyalgia 2018  . GERD (gastroesophageal reflux disease)   . MIGRAINE, COMMON   . Obesity, unspecified   . OVARIAN CYST    Past Surgical History:  Procedure Laterality Date  . NO PAST SURGERIES     Social History   Socioeconomic History  . Marital status: Single    Spouse name: Not on file  . Number of children: Not on file  . Years of education: Not on file  . Highest education level: Not on file  Occupational History  . Not on file  Tobacco Use  . Smoking status: Former Smoker    Quit date: 08/20/2002    Years since quitting: 18.1  . Smokeless tobacco: Never Used  . Tobacco comment: single, separated from spouse 02/2010.   Vaping Use  . Vaping Use: Never used  Substance and Sexual Activity  . Alcohol use: Yes    Comment: couple of drinks a week  . Drug use: No  . Sexual activity: Not Currently    Birth control/protection: I.U.D.  Other Topics Concern  . Not on file  Social History Narrative   Single, separated from spouse 02/2010   Social Determinants of Health   Financial Resource Strain: Not on file  Food Insecurity: Not on file  Transportation Needs: Not on file  Physical Activity: Not on file  Stress: Not on file  Social Connections: Not on file  Intimate Partner Violence: Not on file   Current Outpatient Medications on File Prior to Visit  Medication Sig Dispense Refill  . almotriptan (AXERT) 12.5 MG tablet Take 1 tablet (12.5 mg total) by mouth as needed for migraine. may repeat in 2 hours if  needed 12 tablet 3  . ALPRAZolam (XANAX) 0.5 MG tablet TAKE 1 TABLET(0.5 MG) BY MOUTH THREE TIMES DAILY AS NEEDED FOR ANXIETY 60 tablet 0  . amphetamine-dextroamphetamine (ADDERALL XR) 20 MG 24 hr capsule Take 1 capsule (20 mg total) by mouth daily as needed (school or work days). 30 capsule 0  . Continuous Blood Gluc Receiver (FREESTYLE LIBRE 2 READER) DEVI USE AS DIRECTED 1 each 0  . Continuous Blood Gluc Sensor (FREESTYLE LIBRE 2 SENSOR) MISC 1 each by Does not apply route every 14 (fourteen) days. 6 each 3  . DULoxetine (CYMBALTA) 30 MG capsule Take 1 capsule (30 mg total) by mouth daily. Take  in addition to 60 mg cap for total of 90 mg daily 90 capsule 1  . DULoxetine (CYMBALTA) 60 MG capsule TAKE 1 CAPSULE BY MOUTH EVERY DAY 90 capsule 1  . fluconazole (DIFLUCAN) 150 MG tablet Take 1 tablet midway through the antibiotic course, then 1 tablet after finishing the antibiotics. 2 tablet 0  . gabapentin (NEURONTIN) 100 MG capsule Take 2 capsules (200 mg total) by mouth at bedtime. 180 capsule 1  . glucose blood (ONETOUCH VERIO) test strip Use 2x a day 200 each 3  . insulin NPH Human (NOVOLIN N) 100 UNIT/ML injection Inject 20-40 Units into the skin. 40 units in am and 24 units at night time    . insulin regular (NOVOLIN R RELION) 100 units/mL injection Inject 0.1-0.16 mLs (10-16 Units total) into the skin 3 (three) times daily before meals. (Patient taking differently: Inject 15-20 Units into the skin 3 (three) times daily before meals. Inject 15-20 units into the skin 3 times daily before meals) 10 mL 11  . Insulin Syringe-Needle U-100 (B-D INS SYR ULTRAFINE .5CC/30G) 30G X 1/2" 0.5 ML MISC Use to inject insulin once daily. 100 each 11  . levonorgestrel (MIRENA) 20 MCG/24HR IUD 1 Intra Uterine Device (1 each total) by Intrauterine route once. 1 each 0  . metFORMIN (GLUCOPHAGE) 1000 MG tablet TAKE 2 TABLETS BY MOUTH DAILY WITH SUPPER. TAKE 1 TABLET BY MOUTH TWICE A DAY WITH A MEAL 180 tablet 3  .  metoprolol succinate (TOPROL-XL) 25 MG 24 hr tablet TAKE 1/2 TABLET BY MOUTH EVERY DAY 45 tablet 1  . omeprazole (PRILOSEC) 40 MG capsule TAKE 1 CAPSULE (40 MG TOTAL) BY MOUTH DAILY BEFORE SUPPER. 90 capsule 1  . ondansetron (ZOFRAN ODT) 4 MG disintegrating tablet Take 1 tablet (4 mg total) by mouth every 8 (eight) hours as needed for nausea or vomiting. 20 tablet 0  . rosuvastatin (CRESTOR) 5 MG tablet TAKE 1 TABLET (5 MG TOTAL) BY MOUTH 2 (TWO) TIMES A WEEK. 13 tablet 3  . triamcinolone cream (KENALOG) 0.1 % Apply 1 application topically 2 (two) times daily as needed (for itching). 28.4 g 1   No current facility-administered medications on file prior to visit.   Allergies  Allergen Reactions  . Chicken Allergy Anaphylaxis and Shortness Of Breath  . Covid-19 Mrna Vacc (Moderna) Anaphylaxis  . Other Itching, Swelling and Other (See Comments)    NUTS (of ANY kind): Lips and mouth swell, but no breathing impairment & migraines and eczema are triggered  . Propofol Shortness Of Breath    SOB, chest tightness, wheezing after colonoscopy on 09-18-15  . Basaglar Kwikpen [Insulin Glargine] Palpitations and Hypertension  . Adhesive [Tape] Other (See Comments)    Makes the skin VERY RED   . Gluten Meal Diarrhea  . Peanut-Containing Drug Products Itching, Swelling and Other (See Comments)    Lips and mouth swell, but no breathing impairment & migraines and eczema are triggered   . Novolog [Insulin Aspart] Rash  . Tyler Aas [Insulin Degludec] Rash    eczema  . Victoza [Liraglutide] Rash   Family History  Problem Relation Age of Onset  . Diabetes Mother   . Hyperlipidemia Mother   . Diabetes Father   . Hyperlipidemia Father   . Coronary artery disease Father   . Hypertension Father   . Cancer Father        esoph  . Stroke Father   . Cancer Maternal Grandfather 29       Prostate and Lung  .  Diabetes Sister        Gestational  . Berenice Primas' disease Maternal Aunt   . Cancer Maternal Uncle         Brain  . Stroke Paternal Grandmother   . Heart disease Paternal Grandmother   . Stroke Paternal Grandfather   . Heart disease Paternal Grandfather   . Diabetes Sister        Gestational and Type II  . Hashimoto's thyroiditis Cousin   . Cancer Maternal Uncle        Brain       Objective:   Physical Exam BP 120/82 (BP Location: Right Arm, Patient Position: Sitting, Cuff Size: Normal)   Pulse (!) 116   Ht 5\' 4"  (1.626 m)   Wt 235 lb 6.4 oz (106.8 kg)   SpO2 99%   BMI 40.41 kg/m   Wt Readings from Last 3 Encounters:  10/16/20 235 lb 6.4 oz (106.8 kg)  06/12/20 227 lb 6.4 oz (103.1 kg)  05/09/20 223 lb (101.2 kg)   Constitutional: overweight, in NAD Eyes: PERRLA, EOMI, no exophthalmos ENT: moist mucous membranes, no thyromegaly, no cervical lymphadenopathy Cardiovascular: RRR, No MRG Respiratory: CTA B Gastrointestinal: abdomen soft, NT, ND, BS+ Musculoskeletal: no deformities, strength intact in all 4 Skin: moist, warm, no rashes Neurological: no tremor with outstretched hands, DTR normal in all 4  Assessment:     1. DM2, insulin-dependent, uncontrolled, without complications - r/o autoimmunity Component     Latest Ref Rng 08/17/2012  Glutamic Acid Decarb Ab     <=1.0 U/mL <1.0  Pancreatic Islet Cell Antibody     < 5 JDF Units <5  C-Peptide     0.80 - 3.90 ng/mL 1.84  Glucose     70 - 99 mg/dL 104 (H)     2. Obesity class II BMI Classification:  < 18.5 underweight   18.5-24.9 normal weight   25.0-29.9 overweight   30.0-34.9 class I obesity   35.0-39.9 class II obesity   ? 40.0 class III obesity   3. HL  Plan:     1. Patient with longstanding, uncontrolled, type 2 diabetes, on Metformin and basal-bolus insulin regimen with still poor control.  Unfortunately, we cannot use SGLT2 inhibitors and GLP-1 receptor agonist due to previous intolerance.  We also had to switch from NovoLog to regular insulin due to rash.  She has intolerances to other insulins so  we have to continue with NPH and regular insulin for now. -At last visit, reviewing her CGM downloads, her sugars were decreasing as the day went by after an initial increase from 3 AM to 8 AM.  Sugars were higher after dinner but they were dropping during the night, sometimes even up to the 50s.  We discussed that her high blood sugars in the morning may have been due to rebound hyperglycemia after an initial low overnight.  We decreased the doses of NPH at night and also backed off regular insulin. -At this visit, she tells me that she decreased the dose of NPH slightly more at night and she is also taking the lower regular dose recommended to avoid dropping her sugars.  She has fluctuations during the day and blood sugars drop throughout the night.  Her alarms are waking her up and she cannot sleep well. CGM interpretation: -At today's visit, we reviewed her CGM downloads: It appears that 64% of values are in target range (goal >70%), while 31% are higher than 180 (goal <25%), and 5% are lower than 70 (  goal <4%).  The calculated average blood sugar is 157.  The projected HbA1c for the next 3 months (GMI) is 7.1. -Reviewing the CGM trends, sugars are dropping abruptly after dinner to a nadir between 50 and 120 around 12 AM.  Afterwards they increase significantly peaking at 8 AM.  After breakfast, lunch, and dinner, sugars are decreasing.  Before dinner, she has another hypoglycemic peak due to eating a snack in the afternoon when her sugars are low after lunch.  She does not cover the snack with insulin. -Based on these factors, it appears that her regular insulin dose is excessive.  We will back off to 10 to 14 units (and only rarely 18 units with a larger meal) with breakfast and lunch.  However, with dinner, I advised her to use much lower doses, between 4 to 8 units.  Which sugars increasing abruptly from 12 AM to 8 AM, it looks like her NPH at night is not sufficient.  We will increase this dose and  decrease the morning dose. -Ultimately, based on her nausea and vomiting, I suggested to split the Metformin dose and even decrease it if she still has the symptoms afterwards. - I suggested to:  Patient Instructions  Please split: - Metformin to 1000 mg 2x a day with meals (may need 500 mg 2x a day)  Please change: - NPH 34 units and 26-30 units at bedtime - Regular insulin 10-14 (18) units  30 min before B and L and 4-8 units before dinner  Please return in 3-4 months with your sugar log.    - we checked her HbA1c: 8.6% (slightly higher) - advised to check sugars at different times of the day - 4x a day, rotating check times - advised for yearly eye exams >> she is UTD - return to clinic in 3-4 months    2. Obesity class II -She gained 6 pounds before last visit and 8 pounds since last visit -We will continue Metformin, which is weight stabilizing long-term -Unfortunately, we cannot use an SGLT2 inhibitor or GLP-1 receptor agonist due to previous intolerances.  This would have helped with weight loss  3. HL -Reviewed latest lipid panel from 04/2020: LDL slightly above goal, the rest of the fractions at goal: Lab Results  Component Value Date   CHOL 183 05/09/2020   HDL 59 05/09/2020   LDLCALC 102 (H) 05/09/2020   TRIG 128 05/09/2020   CHOLHDL 3.1 05/09/2020  -She is not on a statin  Philemon Kingdom, MD PhD [facility] Endocrinology

## 2020-10-16 NOTE — Patient Instructions (Addendum)
Please split: - Metformin to 1000 mg 2x a day with meals (may need 500 mg 2x a day)  Please change: - NPH 34 units and 26-30 units at bedtime - Regular insulin 10-14 (18) units  30 min before B and L and 4-8 units before dinner  Please return in 3-4 months with your sugar log.

## 2020-10-31 ENCOUNTER — Encounter: Payer: Self-pay | Admitting: Internal Medicine

## 2020-11-01 MED ORDER — ALPRAZOLAM 0.5 MG PO TABS: ORAL_TABLET | ORAL | 0 refills | Status: DC

## 2020-11-01 MED ORDER — AMPHETAMINE-DEXTROAMPHET ER 20 MG PO CP24: 20.0000 mg | ORAL_CAPSULE | Freq: Every day | ORAL | 0 refills | Status: DC | PRN

## 2020-11-05 NOTE — Progress Notes (Signed)
Subjective:    Patient ID: Lisa Duran, female    DOB: [birth year], 40 y.o.   MRN: [id number]  HPI The patient is here for follow up of their chronic medical problems, including DM, dyslipidemia, migraines, fibromyalgia, GERD, ADD, depression, anxiety, obesity   She feel bad.  She has SOB, increased heart palps and wonders if her iron is low.  Last week she had a fibro flare that lasted 6 days.  She feels dizzy, fatigued.    Work is stressful.  Her anxiety , depression are worse - the increased dose of medication has helped.  We just increased her dose via MyChart  She is not walking as much due to her fatigue , fibro flare and feet issues.    Has spotting only - no regular menses.    Medications and allergies reviewed with patient and updated if appropriate.  Patient Active Problem List   Diagnosis Date Noted  . Dyslipidemia 04/14/2019  . Sepsis secondary to UTI ([place]) 01/12/2019  . Enteritis 01/12/2019  . Sleep walking disorder 04/06/2017  . Iron deficiency anemia 03/27/2017  . Arthralgia 02/03/2017  . Fibromyalgia 02/03/2017  . GERD (gastroesophageal reflux disease) 06/24/2015  . Eczema 06/22/2015  . Depression 06/22/2015  . Tachycardia 06/16/2014  . Paresthesias 06/12/2014  . Food allergy   . ADHD (attention deficit hyperactivity disorder) 01/27/2011  . Anxiety 02/20/2010  . ALLERGIC RHINITIS 11/21/2009  . OVARIAN CYST 11/21/2009  . Diabetes mellitus type 2 with neurological manifestations ([place]) 11/20/2009  . Class 2 obesity in adult 11/20/2009  . Migraine without aura 11/20/2009    Current Outpatient Medications on File Prior to Visit  Medication Sig Dispense Refill  . almotriptan (AXERT) 12.5 MG tablet Take 1 tablet (12.5 mg total) by mouth as needed for migraine. may repeat in 2 hours if needed 12 tablet 3  . ALPRAZolam (XANAX) 0.5 MG tablet TAKE 1 TABLET(0.5 MG) BY MOUTH THREE TIMES DAILY AS NEEDED FOR ANXIETY 60 tablet 0  . amphetamine-dextroamphetamine  (ADDERALL XR) 20 MG 24 hr capsule Take 1 capsule (20 mg total) by mouth daily as needed (school or work days). 30 capsule 0  . Continuous Blood Gluc Receiver (FREESTYLE LIBRE 2 READER) DEVI USE AS DIRECTED 1 each 0  . Continuous Blood Gluc Sensor (FREESTYLE LIBRE 2 SENSOR) MISC 1 each by Does not apply route every 14 (fourteen) days. 6 each 3  . DULoxetine (CYMBALTA) 30 MG capsule Take 1 capsule (30 mg total) by mouth daily. Take in addition to 60 mg cap for total of 90 mg daily 90 capsule 1  . DULoxetine (CYMBALTA) 60 MG capsule TAKE 1 CAPSULE BY MOUTH EVERY DAY 90 capsule 1  . EPINEPHrine 0.3 mg/0.3 mL IJ SOAJ injection SMARTSIG:0.3 Milligram(s) IM Once PRN    . Fexofenadine HCl (ALLEGRA ALLERGY PO) Take by mouth.    . gabapentin (NEURONTIN) 100 MG capsule Take 2 capsules (200 mg total) by mouth at bedtime. 180 capsule 1  . glucose blood (ONETOUCH VERIO) test strip Use 2x a day 200 each 3  . insulin NPH Human (NOVOLIN N) 100 UNIT/ML injection Inject 20-40 Units into the skin. 40 units in am and 24 units at night time    . insulin regular (NOVOLIN R RELION) 100 units/mL injection Inject 0.1-0.16 mLs (10-16 Units total) into the skin 3 (three) times daily before meals. (Patient taking differently: Inject 15-20 Units into the skin 3 (three) times daily before meals. Inject 15-20 units into the skin 3 times daily  before meals) 10 mL 11  . Insulin Syringe-Needle U-100 (B-D INS SYR ULTRAFINE .5CC/30G) 30G X 1/2" 0.5 ML MISC Use to inject insulin once daily. 100 each 11  . levonorgestrel (MIRENA) 20 MCG/24HR IUD 1 Intra Uterine Device (1 each total) by Intrauterine route once. 1 each 0  . metFORMIN (GLUCOPHAGE) 1000 MG tablet TAKE 2 TABLETS BY MOUTH DAILY WITH SUPPER. TAKE 1 TABLET BY MOUTH TWICE A DAY WITH A MEAL 180 tablet 3  . metoprolol succinate (TOPROL-XL) 25 MG 24 hr tablet TAKE 1/2 TABLET BY MOUTH EVERY DAY 45 tablet 1  . omeprazole (PRILOSEC) 40 MG capsule TAKE 1 CAPSULE (40 MG TOTAL) BY MOUTH  DAILY BEFORE SUPPER. 90 capsule 1  . ondansetron (ZOFRAN ODT) 4 MG disintegrating tablet Take 1 tablet (4 mg total) by mouth every 8 (eight) hours as needed for nausea or vomiting. 20 tablet 0  . rosuvastatin (CRESTOR) 5 MG tablet TAKE 1 TABLET (5 MG TOTAL) BY MOUTH 2 (TWO) TIMES A WEEK. 13 tablet 3  . triamcinolone cream (KENALOG) 0.1 % Apply 1 application topically 2 (two) times daily as needed (for itching). 28.4 g 1   No current facility-administered medications on file prior to visit.    Past Medical History:  Diagnosis Date  . ADHD (attention deficit hyperactivity disorder)   . ALLERGIC RHINITIS   . Allergy   . Anemia   . ANXIETY   . Depression   . DIABETES MELLITUS, TYPE II   . Fibromyalgia 2018  . GERD (gastroesophageal reflux disease)   . MIGRAINE, COMMON   . Obesity, unspecified   . OVARIAN CYST     Past Surgical History:  Procedure Laterality Date  . NO PAST SURGERIES      Social History   Socioeconomic History  . Marital status: Single    Spouse name: Not on file  . Number of children: Not on file  . Years of education: Not on file  . Highest education level: Not on file  Occupational History  . Not on file  Tobacco Use  . Smoking status: Former Smoker    Quit date: 08/20/2002    Years since quitting: 18.2  . Smokeless tobacco: Never Used  . Tobacco comment: single, separated from spouse 02/2010.   Vaping Use  . Vaping Use: Never used  Substance and Sexual Activity  . Alcohol use: Yes    Comment: couple of drinks a week  . Drug use: No  . Sexual activity: Not Currently    Birth control/protection: I.U.D.  Other Topics Concern  . Not on file  Social History Narrative   Single, separated from spouse 02/2010   Social Determinants of Health   Financial Resource Strain: Not on file  Food Insecurity: Not on file  Transportation Needs: Not on file  Physical Activity: Not on file  Stress: Not on file  Social Connections: Not on file    Family  History  Problem Relation Age of Onset  . Diabetes Mother   . Hyperlipidemia Mother   . Diabetes Father   . Hyperlipidemia Father   . Coronary artery disease Father   . Hypertension Father   . Cancer Father        esoph  . Stroke Father   . Cancer Maternal Grandfather 67       Prostate and Lung  . Diabetes Sister        Gestational  . Berenice Primas' disease Maternal Aunt   . Cancer Maternal Uncle  Brain  . Stroke Paternal Grandmother   . Heart disease Paternal Grandmother   . Stroke Paternal Grandfather   . Heart disease Paternal Grandfather   . Diabetes Sister        Gestational and Type II  . Hashimoto's thyroiditis Cousin   . Cancer Maternal Uncle        Brain    Review of Systems  Constitutional: Positive for fatigue. Negative for fever.  Respiratory: Positive for cough (from allergies) and shortness of breath. Negative for wheezing.   Cardiovascular: Positive for chest pain (tightness with palpations), palpitations and leg swelling (mild).  Gastrointestinal: Positive for abdominal pain (epigastric), nausea and vomiting (cyclical epigastric pain, nausea, vomiting - food from whole day).  Genitourinary: Positive for frequency and urgency.  Neurological: Positive for dizziness, light-headedness and headaches (head pain on top of head down to sides and toward neck, no sinus or increase in migraines).  Psychiatric/Behavioral: Positive for dysphoric mood and sleep disturbance. The patient is nervous/anxious.        Objective:   Vitals:   11/06/20 0843  BP: 128/80  Pulse: (!) 111  Temp: 98.3 F (36.8 C)  SpO2: 98%   BP Readings from Last 3 Encounters:  11/06/20 128/80  10/16/20 120/82  06/12/20 124/82   Wt Readings from Last 3 Encounters:  11/06/20 236 lb (107 kg)  10/16/20 235 lb 6.4 oz (106.8 kg)  06/12/20 227 lb 6.4 oz (103.1 kg)   Body mass index is 40.51 kg/m.  Depression screen [facility] 2/9 11/06/2020 04/27/2019 04/06/2018  Decreased Interest 1 1 1   Down,  Depressed, Hopeless 1 1 2   PHQ - 2 Score 2 2 3   Altered sleeping 3 2 1   Tired, decreased energy 3 2 2   Change in appetite 2 1 1   Feeling bad or failure about yourself  1 1 1   Trouble concentrating 2 2 1   Moving slowly or fidgety/restless 1 1 0  Suicidal thoughts 0 0 0  PHQ-9 Score 14 11 9   Difficult doing work/chores Very difficult - -  Some recent data might be hidden    GAD 7 : Generalized Anxiety Score 11/06/2020 04/27/2019 04/06/2018  Nervous, Anxious, on Edge 2 3 2   Control/stop worrying 2 3 2   Worry too much - different things 2 2 2   Trouble relaxing 2 2 1   Restless 1 1 1   Easily annoyed or irritable 1 1 1   Afraid - awful might happen 1 1 2   Total GAD 7 Score 11 13 11   Anxiety Difficulty Extremely difficult - -       Physical Exam    Constitutional: Appears well-developed and well-nourished. No distress.  HENT:  Head: Normocephalic and atraumatic.  Neck: Neck supple. No tracheal deviation present. No thyromegaly present.  No cervical lymphadenopathy Cardiovascular: Normal rate, regular rhythm and normal heart sounds.   No murmur heard. No carotid bruit .  No edema Pulmonary/Chest: Effort normal and breath sounds normal. No respiratory distress. No has no wheezes. No rales.  Skin: Skin is warm and dry. Not diaphoretic.  Psychiatric: Normal mood and affect. Behavior is normal.      Assessment & Plan:    See Problem List for Assessment and Plan of chronic medical problems.    This visit occurred during the SARS-CoV-2 public health emergency.  Safety protocols were in place, including screening questions prior to the visit, additional usage of staff PPE, and extensive cleaning of exam room while observing appropriate contact time as indicated for disinfecting solutions.

## 2020-11-05 NOTE — Patient Instructions (Addendum)
  Blood work was ordered.     Medications changes include :   Increase metoprolol to 25 mg daily  Your prescription(s) have been submitted to your pharmacy. Please take as directed and contact our office if you believe you are having problem(s) with the medication(s).   A referral was ordered for cardiology.      Someone from their office will call you to schedule an appointment.    Please followup in 6 months

## 2020-11-06 ENCOUNTER — Encounter: Payer: Self-pay | Admitting: Internal Medicine

## 2020-11-06 ENCOUNTER — Other Ambulatory Visit: Payer: Self-pay

## 2020-11-06 ENCOUNTER — Ambulatory Visit: Payer: BC Managed Care – PPO | Admitting: Internal Medicine

## 2020-11-06 VITALS — BP 128/80 | HR 111 | Temp 98.3°F | Ht 64.0 in | Wt 236.0 lb

## 2020-11-06 DIAGNOSIS — D649 Anemia, unspecified: Secondary | ICD-10-CM

## 2020-11-06 DIAGNOSIS — E785 Hyperlipidemia, unspecified: Secondary | ICD-10-CM

## 2020-11-06 DIAGNOSIS — R5383 Other fatigue: Secondary | ICD-10-CM | POA: Diagnosis not present

## 2020-11-06 DIAGNOSIS — F419 Anxiety disorder, unspecified: Secondary | ICD-10-CM | POA: Diagnosis not present

## 2020-11-06 DIAGNOSIS — K219 Gastro-esophageal reflux disease without esophagitis: Secondary | ICD-10-CM

## 2020-11-06 DIAGNOSIS — R350 Frequency of micturition: Secondary | ICD-10-CM

## 2020-11-06 DIAGNOSIS — G43009 Migraine without aura, not intractable, without status migrainosus: Secondary | ICD-10-CM | POA: Diagnosis not present

## 2020-11-06 DIAGNOSIS — E1149 Type 2 diabetes mellitus with other diabetic neurological complication: Secondary | ICD-10-CM

## 2020-11-06 DIAGNOSIS — Z6839 Body mass index (BMI) 39.0-39.9, adult: Secondary | ICD-10-CM

## 2020-11-06 DIAGNOSIS — R000 Tachycardia, unspecified: Secondary | ICD-10-CM

## 2020-11-06 DIAGNOSIS — F3289 Other specified depressive episodes: Secondary | ICD-10-CM

## 2020-11-06 DIAGNOSIS — F900 Attention-deficit hyperactivity disorder, predominantly inattentive type: Secondary | ICD-10-CM

## 2020-11-06 DIAGNOSIS — M797 Fibromyalgia: Secondary | ICD-10-CM

## 2020-11-06 LAB — URINALYSIS, ROUTINE W REFLEX MICROSCOPIC
Bilirubin Urine: NEGATIVE
Hgb urine dipstick: NEGATIVE
Ketones, ur: NEGATIVE
Leukocytes,Ua: NEGATIVE
Nitrite: NEGATIVE
RBC / HPF: NONE SEEN (ref 0–?)
Specific Gravity, Urine: 1.02 (ref 1.000–1.030)
Total Protein, Urine: NEGATIVE
Urine Glucose: NEGATIVE
Urobilinogen, UA: 0.2 (ref 0.0–1.0)
pH: 8 (ref 5.0–8.0)

## 2020-11-06 LAB — COMPREHENSIVE METABOLIC PANEL
ALT: 22 U/L (ref 0–35)
AST: 18 U/L (ref 0–37)
Albumin: 4.4 g/dL (ref 3.5–5.2)
Alkaline Phosphatase: 126 U/L — ABNORMAL HIGH (ref 39–117)
BUN: 7 mg/dL (ref 6–23)
CO2: 26 mEq/L (ref 19–32)
Calcium: 9.6 mg/dL (ref 8.4–10.5)
Chloride: 100 mEq/L (ref 96–112)
Creatinine, Ser: 0.75 mg/dL (ref 0.40–1.20)
GFR: 98.43 mL/min (ref >60.00)
Glucose, Bld: 179 mg/dL — ABNORMAL HIGH (ref 70–99)
Potassium: 4.2 mEq/L (ref 3.5–5.1)
Sodium: 137 mEq/L (ref 135–145)
Total Bilirubin: 0.3 mg/dL (ref 0.2–1.2)
Total Protein: 8.1 g/dL (ref 6.0–8.3)

## 2020-11-06 LAB — CBC WITH DIFFERENTIAL/PLATELET
Basophils Absolute: 0.1 10*3/uL (ref 0.0–0.1)
Basophils Relative: 1 % (ref 0.0–3.0)
Eosinophils Absolute: 0.4 10*3/uL (ref 0.0–0.7)
Eosinophils Relative: 4 % (ref 0.0–5.0)
HCT: 33.7 % — ABNORMAL LOW (ref 36.0–46.0)
Hemoglobin: 10.1 g/dL — ABNORMAL LOW (ref 12.0–15.0)
Lymphocytes Relative: 21.2 % (ref 12.0–46.0)
Lymphs Abs: 1.9 10*3/uL (ref 0.7–4.0)
MCHC: 29.8 g/dL — ABNORMAL LOW (ref 30.0–36.0)
MCV: 63 fl — ABNORMAL LOW (ref 78.0–100.0)
Monocytes Absolute: 0.7 10*3/uL (ref 0.1–1.0)
Monocytes Relative: 8.3 % (ref 3.0–12.0)
Neutro Abs: 5.9 10*3/uL (ref 1.4–7.7)
Neutrophils Relative %: 65.5 % (ref 43.0–77.0)
Platelets: 500 10*3/uL — ABNORMAL HIGH (ref 150.0–400.0)
RBC: 5.39 Mil/uL — ABNORMAL HIGH (ref 3.87–5.11)
RDW: 19.2 % — ABNORMAL HIGH (ref 11.5–15.5)
WBC: 8.9 10*3/uL (ref 4.0–10.5)

## 2020-11-06 LAB — LIPID PANEL
Cholesterol: 151 mg/dL (ref 0–200)
HDL: 67.8 mg/dL (ref >39.00)
LDL Cholesterol: 71 mg/dL (ref 0–99)
NonHDL: 83.34
Total CHOL/HDL Ratio: 2
Triglycerides: 64 mg/dL (ref 0.0–149.0)
VLDL: 12.8 mg/dL (ref 0.0–40.0)

## 2020-11-06 LAB — TSH: TSH: 4.08 u[IU]/mL (ref 0.35–4.50)

## 2020-11-06 LAB — VITAMIN B12: Vitamin B-12: 211 pg/mL (ref 211–911)

## 2020-11-06 MED ORDER — METOPROLOL SUCCINATE ER 25 MG PO TB24: 25.0000 mg | ORAL_TABLET | Freq: Every day | ORAL | 1 refills | Status: DC

## 2020-11-06 NOTE — Assessment & Plan Note (Signed)
Chronic She is not currently exercising for several reasons, but knows the importance of exercise and will try to restart once able She understands the importance of losing weight wound: Decreasing portions and healthy diet

## 2020-11-06 NOTE — Assessment & Plan Note (Signed)
Chronic Management per Dr Gherghe 

## 2020-11-06 NOTE — Assessment & Plan Note (Signed)
Acute on chronic Check CBC, iron level given her history of anemia Check TSH

## 2020-11-06 NOTE — Assessment & Plan Note (Signed)
Chronic GERD controlled Continue omeprazole 40 mg daily 

## 2020-11-06 NOTE — Assessment & Plan Note (Signed)
Chronic Persistent Currently taking metoprolol 12.5 mg daily-we will increase to 25 mg daily There is no change in her tachycardia when stopping Adderall We will refer to cardiology for further evaluation-I am concerned about the long-term effects of her sinus tachycardia She is experiencing some shortness of breath, which could be related to this, but possibly related to her anemia

## 2020-11-06 NOTE — Assessment & Plan Note (Signed)
Chronic Not ideally controlled, but improving some since we increase Cymbalta recently via MyChart Continue Cymbalta 90 mg daily If depression does not improve she may need to follow-up sooner than 6 months

## 2020-11-06 NOTE — Assessment & Plan Note (Signed)
Chronic Had a recent flare-last week that lasted 6 days, which is unusual Continue gabapentin 200 mg at night Continue Cymbalta-recently increased to 90 mg daily Encouraged regular exercise Anxiety and depression not currently controlled, but Cymbalta was increased recently which should help.

## 2020-11-06 NOTE — Assessment & Plan Note (Signed)
  Overall migraines have been okay-getting more tension headaches or headaches related to other things When she does get a migraine she takes axert 12.5 mg as needed or Excedrin migraine OTC Continue above

## 2020-11-06 NOTE — Assessment & Plan Note (Signed)
Acute ?  UTI Urinalysis, urine culture

## 2020-11-06 NOTE — Assessment & Plan Note (Signed)
Chronic Controlled, stable Continue Adderall XR 20 mg daily

## 2020-11-06 NOTE — Assessment & Plan Note (Signed)
Chronic Check lipid panel  Continue Crestor 5 mg twice weekly Regular exercise and healthy diet encouraged

## 2020-11-06 NOTE — Assessment & Plan Note (Signed)
Chronic Not ideally controlled We just recently increased her Cymbalta to 90 mg daily and she thinks that is helping some Continue Cymbalta 90 mg daily and Xanax 0.5 mg 3 times daily as needed Follow-up sooner than 6 months if anxiety is not improving/controlled

## 2020-11-06 NOTE — Assessment & Plan Note (Signed)
Chronic Has had iron deficiency and B12 deficiency in the past Check CBC, B12 and iron panel Has not tolerated oral iron secondary to constipation and has had iron infusions in the past, which we may need to consider if needed

## 2020-11-07 LAB — IRON,TIBC AND FERRITIN PANEL
%SAT: 4 % (calc) — ABNORMAL LOW (ref 16–45)
Ferritin: 5 ng/mL — ABNORMAL LOW (ref 16–232)
Iron: 19 ug/dL — ABNORMAL LOW (ref 40–190)
TIBC: 444 mcg/dL (calc) (ref 250–450)

## 2020-11-07 LAB — URINE CULTURE

## 2020-11-09 ENCOUNTER — Telehealth: Payer: Self-pay | Admitting: Pharmacy Technician

## 2020-11-09 ENCOUNTER — Encounter: Payer: Self-pay | Admitting: Internal Medicine

## 2020-11-09 ENCOUNTER — Other Ambulatory Visit: Payer: Self-pay | Admitting: Internal Medicine

## 2020-11-09 NOTE — Telephone Encounter (Signed)
  Payer: [employer] Source of follow up: phone call Phone #: Rep Name:  Reference number: [id number] Medication & CPT/J Code(s) : Feraheme (ferumoxytol) K3838   Requires NO prior auth.

## 2020-11-14 ENCOUNTER — Ambulatory Visit (INDEPENDENT_AMBULATORY_CARE_PROVIDER_SITE_OTHER): Payer: BC Managed Care – PPO

## 2020-11-14 ENCOUNTER — Other Ambulatory Visit: Payer: Self-pay

## 2020-11-14 DIAGNOSIS — E538 Deficiency of other specified B group vitamins: Secondary | ICD-10-CM | POA: Diagnosis not present

## 2020-11-14 MED ORDER — CYANOCOBALAMIN 1000 MCG/ML IJ SOLN
1000.0000 ug | INTRAMUSCULAR | Status: DC
Start: 2020-11-14 — End: 2021-03-18
  Administered 2020-11-14 – 2021-02-14 (×3): 1000 ug via INTRAMUSCULAR

## 2020-11-14 NOTE — Progress Notes (Signed)
Pt here for monthly B12 injection per Dr. Quay Burow  B12 1058mcg given right  IM, and pt tolerated injection well.  Next B12 injection scheduled for 12/15/20.

## 2020-11-16 ENCOUNTER — Other Ambulatory Visit: Payer: Self-pay

## 2020-11-16 ENCOUNTER — Ambulatory Visit (INDEPENDENT_AMBULATORY_CARE_PROVIDER_SITE_OTHER): Payer: BC Managed Care – PPO

## 2020-11-16 VITALS — BP 138/87 | HR 106 | Temp 97.6°F | Resp 18 | Ht 64.0 in | Wt 235.8 lb

## 2020-11-16 DIAGNOSIS — D508 Other iron deficiency anemias: Secondary | ICD-10-CM | POA: Diagnosis not present

## 2020-11-16 MED ORDER — DIPHENHYDRAMINE HCL 50 MG/ML IJ SOLN: 50.0000 mg | Freq: Once | INTRAMUSCULAR | Status: DC | PRN

## 2020-11-16 MED ORDER — SODIUM CHLORIDE 0.9 % IV SOLN: Freq: Once | INTRAVENOUS | Status: DC | PRN

## 2020-11-16 MED ORDER — SODIUM CHLORIDE 0.9 % IV SOLN
510.0000 mg | Freq: Once | INTRAVENOUS | Status: AC
  Administered 2020-11-16: 510 mg via INTRAVENOUS
  Filled 2020-11-16: qty 17

## 2020-11-16 MED ORDER — EPINEPHRINE 0.3 MG/0.3ML IJ SOAJ: 0.3000 mg | Freq: Once | INTRAMUSCULAR | Status: DC | PRN

## 2020-11-16 MED ORDER — FAMOTIDINE IN NACL 20-0.9 MG/50ML-% IV SOLN
20.0000 mg | Freq: Once | INTRAVENOUS | Status: AC | PRN
  Administered 2020-11-16: 20 mg via INTRAVENOUS
  Filled 2020-11-16: qty 50

## 2020-11-16 MED ORDER — METHYLPREDNISOLONE SODIUM SUCC 125 MG IJ SOLR: 125.0000 mg | Freq: Once | INTRAMUSCULAR | Status: DC | PRN

## 2020-11-16 MED ORDER — ALBUTEROL SULFATE HFA 108 (90 BASE) MCG/ACT IN AERS: 2.0000 | INHALATION_SPRAY | Freq: Once | RESPIRATORY_TRACT | Status: DC | PRN

## 2020-11-16 NOTE — Progress Notes (Addendum)
Diagnosis: Iron Deficiency Anemia  Provider:  Marshell Garfinkel, MD  Procedure: Infusion  IV Type: Peripheral, IV Location: L Antecubital  Feraheme (Ferumoxytol), Dose: 510mg s  Infusion Start Time: 9767  Infusion Stop Time: 3419  Post Infusion IV Care: Observation period completed. Patient c/o nausea post infusion. Famotidine via IV infusion given with relief.  Discharge: Condition: Good, Destination: Home   Performed by:  Paul Dykes, RN

## 2020-11-26 ENCOUNTER — Other Ambulatory Visit: Payer: Self-pay

## 2020-11-26 ENCOUNTER — Ambulatory Visit (INDEPENDENT_AMBULATORY_CARE_PROVIDER_SITE_OTHER): Payer: BC Managed Care – PPO

## 2020-11-26 ENCOUNTER — Other Ambulatory Visit: Payer: Self-pay | Admitting: Internal Medicine

## 2020-11-26 VITALS — BP 111/75 | HR 84 | Temp 98.0°F | Resp 18 | Ht 64.0 in | Wt 235.2 lb

## 2020-11-26 DIAGNOSIS — D508 Other iron deficiency anemias: Secondary | ICD-10-CM | POA: Diagnosis not present

## 2020-11-26 MED ORDER — EPINEPHRINE 0.3 MG/0.3ML IJ SOAJ: 0.3000 mg | Freq: Once | INTRAMUSCULAR | Status: DC | PRN

## 2020-11-26 MED ORDER — SODIUM CHLORIDE 0.9 % IV SOLN
510.0000 mg | Freq: Once | INTRAVENOUS | Status: AC
  Administered 2020-11-26: 510 mg via INTRAVENOUS
  Filled 2020-11-26: qty 17

## 2020-11-26 MED ORDER — SODIUM CHLORIDE 0.9 % IV SOLN: Freq: Once | INTRAVENOUS | Status: DC | PRN

## 2020-11-26 MED ORDER — DIPHENHYDRAMINE HCL 50 MG/ML IJ SOLN: 50.0000 mg | Freq: Once | INTRAMUSCULAR | Status: DC | PRN

## 2020-11-26 MED ORDER — FAMOTIDINE IN NACL 20-0.9 MG/50ML-% IV SOLN: 20.0000 mg | Freq: Once | INTRAVENOUS | Status: DC | PRN

## 2020-11-26 MED ORDER — ALBUTEROL SULFATE HFA 108 (90 BASE) MCG/ACT IN AERS: 2.0000 | INHALATION_SPRAY | Freq: Once | RESPIRATORY_TRACT | Status: DC | PRN

## 2020-11-26 MED ORDER — METHYLPREDNISOLONE SODIUM SUCC 125 MG IJ SOLR: 125.0000 mg | Freq: Once | INTRAMUSCULAR | Status: DC | PRN

## 2020-11-26 NOTE — Progress Notes (Signed)
Diagnosis: Iron Deficiency Anemia  Provider:  Marshell Garfinkel, MD  Procedure: Infusion  IV Type: Peripheral, IV Location: R Hand  Feraheme (Ferumoxytol), Dose: 510 mg  Infusion Start Time: 1017am  Infusion Stop Time: 3382  Post Infusion IV Care: Observation period completed and Peripheral IV Discontinued  Discharge: Condition: Good, Destination: Home . AVS provided to patient.   Performed by:  Koren Shiver, RN

## 2020-12-14 ENCOUNTER — Other Ambulatory Visit: Payer: Self-pay

## 2020-12-14 ENCOUNTER — Ambulatory Visit (INDEPENDENT_AMBULATORY_CARE_PROVIDER_SITE_OTHER): Payer: No Typology Code available for payment source

## 2020-12-14 DIAGNOSIS — E538 Deficiency of other specified B group vitamins: Secondary | ICD-10-CM | POA: Diagnosis not present

## 2020-12-14 NOTE — Progress Notes (Signed)
Pt here for monthly B12 injection per Dr. Quay Burow  B12 1057mcg given right deltoid IM, and pt tolerated injection well.  Next B12 injection scheduled for 01/14/21

## 2020-12-17 ENCOUNTER — Encounter: Payer: Self-pay | Admitting: Internal Medicine

## 2020-12-18 NOTE — Progress Notes (Signed)
Chief Complaint  Patient presents with  . New Patient (Initial Visit)    Tachycardia    History of Present Illness: 40 yo female with history of ADHD, anemia, anxiety, depression, diabetes mellitus, fibromyalgia, GERD and migraine headaches who is here today as a new consult, referred by Dr. Quay Burow, for the evaluation of tachycardia. TSH normal in April 2022. She has been on Toprol. She describes dyspnea with exertion and occasional chest pressure. She feels her heart racing. No change when holding Adderall for one month. Less dyspnea after iron infusion.   Primary Care Physician: Binnie Rail, MD   Past Medical History:  Diagnosis Date  . ADHD (attention deficit hyperactivity disorder)   . ALLERGIC RHINITIS   . Allergy   . Anemia   . ANXIETY   . Depression   . DIABETES MELLITUS, TYPE II   . Fibromyalgia 2018  . GERD (gastroesophageal reflux disease)   . MIGRAINE, COMMON   . Obesity, unspecified   . OVARIAN CYST     Past Surgical History:  Procedure Laterality Date  . NO PAST SURGERIES      Current Outpatient Medications  Medication Sig Dispense Refill  . almotriptan (AXERT) 12.5 MG tablet Take 1 tablet (12.5 mg total) by mouth as needed for migraine. may repeat in 2 hours if needed 12 tablet 3  . ALPRAZolam (XANAX) 0.5 MG tablet TAKE 1 TABLET(0.5 MG) BY MOUTH THREE TIMES DAILY AS NEEDED FOR ANXIETY 60 tablet 0  . amphetamine-dextroamphetamine (ADDERALL XR) 20 MG 24 hr capsule Take 1 capsule (20 mg total) by mouth daily as needed (school or work days). 30 capsule 0  . Continuous Blood Gluc Receiver (FREESTYLE LIBRE 2 READER) DEVI USE AS DIRECTED 1 each 0  . Continuous Blood Gluc Sensor (FREESTYLE LIBRE 2 SENSOR) MISC APPLY EVERY 14 DAYS 6 each 3  . DULoxetine (CYMBALTA) 30 MG capsule Take 1 capsule (30 mg total) by mouth daily. Take in addition to 60 mg cap for total of 90 mg daily 90 capsule 1  . DULoxetine (CYMBALTA) 60 MG capsule TAKE 1 CAPSULE BY MOUTH EVERY DAY 90  capsule 1  . EPINEPHrine 0.3 mg/0.3 mL IJ SOAJ injection SMARTSIG:0.3 Milligram(s) IM Once PRN    . Fexofenadine HCl (ALLEGRA ALLERGY PO) Take by mouth.    . gabapentin (NEURONTIN) 100 MG capsule Take 2 capsules (200 mg total) by mouth at bedtime. 180 capsule 1  . glucose blood (ONETOUCH VERIO) test strip Use 2x a day 200 each 3  . insulin NPH Human (NOVOLIN N) 100 UNIT/ML injection Inject 20-40 Units into the skin. 40 units in am and 24 units at night time    . insulin regular (NOVOLIN R RELION) 100 units/mL injection Inject 0.1-0.16 mLs (10-16 Units total) into the skin 3 (three) times daily before meals. (Patient taking differently: Inject 15-20 Units into the skin 3 (three) times daily before meals. Inject 15-20 units into the skin 3 times daily before meals) 10 mL 11  . Insulin Syringe-Needle U-100 (B-D INS SYR ULTRAFINE .5CC/30G) 30G X 1/2" 0.5 ML MISC Use to inject insulin once daily. 100 each 11  . levonorgestrel (MIRENA) 20 MCG/24HR IUD 1 Intra Uterine Device (1 each total) by Intrauterine route once. 1 each 0  . metFORMIN (GLUCOPHAGE) 1000 MG tablet TAKE 2 TABLETS BY MOUTH DAILY WITH SUPPER. TAKE 1 TABLET BY MOUTH TWICE A DAY WITH A MEAL 180 tablet 3  . metoprolol succinate (TOPROL-XL) 25 MG 24 hr tablet Take 1 tablet (  25 mg total) by mouth daily. 90 tablet 1  . omeprazole (PRILOSEC) 40 MG capsule TAKE 1 CAPSULE (40 MG TOTAL) BY MOUTH DAILY BEFORE SUPPER. 90 capsule 1  . ondansetron (ZOFRAN ODT) 4 MG disintegrating tablet Take 1 tablet (4 mg total) by mouth every 8 (eight) hours as needed for nausea or vomiting. 20 tablet 0  . rosuvastatin (CRESTOR) 5 MG tablet TAKE 1 TABLET (5 MG TOTAL) BY MOUTH 2 (TWO) TIMES A WEEK. 13 tablet 3  . triamcinolone cream (KENALOG) 0.1 % Apply 1 application topically 2 (two) times daily as needed (for itching). 28.4 g 1   Current Facility-Administered Medications  Medication Dose Route Frequency Provider Last Rate Last Admin  . cyanocobalamin ((VITAMIN  B-12)) injection 1,000 mcg  1,000 mcg Intramuscular Q30 days Binnie Rail, MD   1,000 mcg at 12/14/20 2423    Allergies  Allergen Reactions  . Chicken Allergy Anaphylaxis and Shortness Of Breath  . Covid-19 Mrna Vacc (Moderna) Anaphylaxis  . Other Itching, Swelling and Other (See Comments)    NUTS (of ANY kind): Lips and mouth swell, but no breathing impairment & migraines and eczema are triggered  . Propofol Shortness Of Breath    SOB, chest tightness, wheezing after colonoscopy on 09-18-15  . Basaglar Kwikpen [Insulin Glargine] Palpitations and Hypertension  . Adhesive [Tape] Other (See Comments)    Makes the skin VERY RED   . Gluten Meal Diarrhea  . Peanut-Containing Drug Products Itching, Swelling and Other (See Comments)    Lips and mouth swell, but no breathing impairment & migraines and eczema are triggered   . Novolog [Insulin Aspart] Rash  . Tyler Aas [Insulin Degludec] Rash    eczema  . Victoza [Liraglutide] Rash    Social History   Socioeconomic History  . Marital status: Single    Spouse name: Not on file  . Number of children: Not on file  . Years of education: Not on file  . Highest education level: Not on file  Occupational History  . Not on file  Tobacco Use  . Smoking status: Former Smoker    Quit date: 08/20/2002    Years since quitting: 18.3  . Smokeless tobacco: Never Used  . Tobacco comment: single, separated from spouse 02/2010.   Vaping Use  . Vaping Use: Never used  Substance and Sexual Activity  . Alcohol use: Yes    Comment: couple of drinks a week  . Drug use: No  . Sexual activity: Not Currently    Birth control/protection: I.U.D.  Other Topics Concern  . Not on file  Social History Narrative   Single, separated from spouse 02/2010   Social Determinants of Health   Financial Resource Strain: Not on file  Food Insecurity: Not on file  Transportation Needs: Not on file  Physical Activity: Not on file  Stress: Not on file  Social  Connections: Not on file  Intimate Partner Violence: Not on file    Family History  Problem Relation Age of Onset  . Diabetes Mother   . Hyperlipidemia Mother   . CAD Mother   . CVA Mother   . Diabetes Father   . Hyperlipidemia Father   . Coronary artery disease Father   . Hypertension Father   . Cancer Father        esoph  . Stroke Father   . Cancer Maternal Grandfather 1       Prostate and Lung  . Diabetes Sister  Gestational  . Graves' disease Maternal Aunt   . Cancer Maternal Uncle        Brain  . Stroke Paternal Grandmother   . Heart disease Paternal Grandmother   . Stroke Paternal Grandfather   . Heart disease Paternal Grandfather   . Diabetes Sister        Gestational and Type II  . Hashimoto's thyroiditis Cousin   . Cancer Maternal Uncle        Brain    Review of Systems:  As stated in the HPI and otherwise negative.   BP 124/86   Pulse (!) 115   Ht 5\' 4"  (1.626 m)   Wt 238 lb (108 kg)   SpO2 98%   BMI 40.85 kg/m   Physical Examination: General: Well developed, well nourished, NAD  HEENT: OP clear, mucus membranes moist  SKIN: warm, dry. No rashes. Neuro: No focal deficits  Musculoskeletal: Muscle strength 5/5 all ext  Psychiatric: Mood and affect normal  Neck: No JVD, no carotid bruits, no thyromegaly, no lymphadenopathy.  Lungs:Clear bilaterally, no wheezes, rhonci, crackles Cardiovascular: Regular, tachy. No murmurs, gallops or rubs. Abdomen:Soft. Bowel sounds present. Non-tender.  Extremities: No lower extremity edema. Pulses are 2 + in the bilateral DP/PT.  EKG:  EKG is ordered today. The ekg ordered today demonstrates sinus tachycardia, rate 115 bpm  Recent Labs: 11/06/2020: ALT 22; BUN 7; Creatinine, Ser 0.75; Hemoglobin 10.1; Platelets 500.0; Potassium 4.2; Sodium 137; TSH 4.08   Lipid Panel    Component Value Date/Time   CHOL 151 11/06/2020 0930   TRIG 64.0 11/06/2020 0930   HDL 67.80 11/06/2020 0930   CHOLHDL 2 11/06/2020  0930   VLDL 12.8 11/06/2020 0930   LDLCALC 71 11/06/2020 0930   LDLCALC 102 (H) 05/09/2020 0923     Wt Readings from Last 3 Encounters:  12/19/20 238 lb (108 kg)  11/26/20 235 lb 3.2 oz (106.7 kg)  11/16/20 235 lb 12.8 oz (107 kg)      Assessment and Plan:   1. Sinus tachycardia: She has resting heart rates over 100 continuously. No change off of Adderall for one month. TSH ok. Will arrange an echo to assess LVEF, exclude pericardial effusion and exclude structural heart disease. 14 day Zio monitor.   2. Dyspnea/Chest pain: Exercise nuclear stress test to exclude ischemia.   Current medicines are reviewed at length with the patient today.  The patient does not have concerns regarding medicines.  The following changes have been made:  no change  Labs/ tests ordered today include:   Orders Placed This Encounter  Procedures  . MYOCARDIAL PERFUSION IMAGING  . LONG TERM MONITOR (3-14 DAYS)  . EKG 12-Lead  . ECHOCARDIOGRAM COMPLETE     Disposition:   F/u with me in 2-3 months.    Signed, Lauree Chandler, MD 12/19/2020 9:55 AM    [facility] [address], [place], [place]  [address] Phone: [phone]; Fax: [phone]

## 2020-12-19 ENCOUNTER — Encounter: Payer: Self-pay | Admitting: Cardiovascular Disease

## 2020-12-19 ENCOUNTER — Ambulatory Visit (INDEPENDENT_AMBULATORY_CARE_PROVIDER_SITE_OTHER): Payer: No Typology Code available for payment source

## 2020-12-19 ENCOUNTER — Ambulatory Visit: Payer: No Typology Code available for payment source | Admitting: Cardiovascular Disease

## 2020-12-19 ENCOUNTER — Encounter: Payer: Self-pay | Admitting: *Deleted

## 2020-12-19 ENCOUNTER — Other Ambulatory Visit: Payer: Self-pay

## 2020-12-19 VITALS — BP 124/86 | HR 115 | Ht 64.0 in | Wt 238.0 lb

## 2020-12-19 DIAGNOSIS — R000 Tachycardia, unspecified: Secondary | ICD-10-CM

## 2020-12-19 DIAGNOSIS — R072 Precordial pain: Secondary | ICD-10-CM

## 2020-12-19 MED ORDER — AMPHETAMINE-DEXTROAMPHET ER 20 MG PO CP24: 20.0000 mg | ORAL_CAPSULE | Freq: Every day | ORAL | 0 refills | Status: DC | PRN

## 2020-12-19 NOTE — Progress Notes (Signed)
Patient ID: Lisa Duran, female   DOB: 29-Nov-1980, 40 y.o.   MRN: [id number] Patient enrolled for [employer] to ship a 14 day zio xt long term holter monitor to her home.

## 2020-12-19 NOTE — Patient Instructions (Signed)
Medication Instructions:  Your physician recommends that you continue on your current medications as directed. Please refer to the Current Medication list given to you today.  *If you need a refill on your cardiac medications before your next appointment, please call your pharmacy*   Lab Work: none If you have labs (blood work) drawn today and your tests are completely normal, you will receive your results only by: [name] MyChart Message (if you have MyChart) OR . A paper copy in the mail If you have any lab test that is abnormal or we need to change your treatment, we will call you to review the results.   Testing/Procedures: Your physician has requested that you have an echocardiogram. Echocardiography is a painless test that uses sound waves to create images of your heart. It provides your doctor with information about the size and shape of your heart and how well your heart's chambers and valves are working. This procedure takes approximately one hour. There are no restrictions for this procedure.  Your physician has requested that you have en exercise stress myoview. For further information please visit [email or web address]. Please follow instruction sheet, as given.   Follow-Up: At [facility], you and your health needs are our priority.  As part of our continuing mission to provide you with exceptional heart care, we have created designated Provider Care Teams.  These Care Teams include your primary Cardiologist (physician) and Advanced Practice Providers (APPs -  Physician Assistants and Nurse Practitioners) who all work together to provide you with the care you need, when you need it.  We recommend signing up for the patient portal called "MyChart".  Sign up information is provided on this After Visit Summary.  MyChart is used to connect with patients for Virtual Visits (Telemedicine).  Patients are able to view lab/test results, encounter notes, upcoming appointments, etc.  Non-urgent  messages can be sent to your provider as well.   To learn more about what you can do with MyChart, go to [email or web address].    Your next appointment:   8-10 week(s)  The format for your next appointment:   In Person  Provider:   You may see Lauree Chandler, MD or one of the following Advanced Practice Providers on your designated Care Team:    Melina Copa, PA-C  Ermalinda Barrios, PA-C    Other Instructions Bryn Gulling- Long Term Monitor Instructions   Your physician has requested you wear a ZIO patch monitor for 14 days.  This is a single patch monitor.   [employer] supplies one patch monitor per enrollment. Additional stickers are not available. Please do not apply patch if you will be having a Nuclear Stress Test, Echocardiogram, Cardiac CT, MRI, or Chest Xray during the period you would be wearing the monitor. The patch cannot be worn during these tests. You cannot remove and re-apply the ZIO XT patch monitor.  Your ZIO patch monitor will be sent Fed Ex from [employer] directly to your home address. It may take 3-5 days to receive your monitor after you have been enrolled.  Once you have received your monitor, please review the enclosed instructions. Your monitor has already been registered assigning a specific monitor serial # to you.  Billing and Patient Assistance Program Information   We have supplied [employer] with any of your insurance information on file for billing purposes. [employer] offers a sliding scale Patient Assistance Program for patients that do not have insurance, or whose insurance does not completely cover the cost of the  ZIO monitor.   You must apply for the Patient Assistance Program to qualify for this discounted rate.     To apply, please call [employer] at [phone], select option 4, then select option 2, and ask to apply for Patient Assistance Program.  Theodore Demark will ask your household income, and how many people are in your household.  They will quote  your out-of-pocket cost based on that information.  [employer] will also be able to set up a 80-month, interest-free payment plan if needed.  Applying the monitor   Shave hair from upper left chest.  Hold abrader disc by orange tab. Rub abrader in 40 strokes over the upper left chest as indicated in your monitor instructions.  Clean area with 4 enclosed alcohol pads. Let dry.  Apply patch as indicated in monitor instructions. Patch will be placed under collarbone on left side of chest with arrow pointing upward.  Rub patch adhesive wings for 2 minutes. Remove white label marked "1". Remove the white label marked "2". Rub patch adhesive wings for 2 additional minutes.  While looking in a mirror, press and release button in center of patch. A small green light will flash 3-4 times. This will be your only indicator that the monitor has been turned on. ?  Do not shower for the first 24 hours. You may shower after the first 24 hours.  Press the button if you feel a symptom. You will hear a small click. Record Date, Time and Symptom in the Patient Logbook.  When you are ready to remove the patch, follow instructions on the last 2 pages of the Patient Logbook. Stick patch monitor onto the last page of Patient Logbook.  Place Patient Logbook in the blue and white box.  Use locking tab on box and tape box closed securely.  The blue and white box has prepaid postage on it. Please place it in the mailbox as soon as possible. Your physician should have your test results approximately 7 days after the monitor has been mailed back to [facility].  Call [place] at [phone] if you have questions regarding your ZIO XT patch monitor. Call them immediately if you see an orange light blinking on your monitor.  If your monitor falls off in less than 4 days, contact our Monitor department at [phone]. ?If your monitor becomes loose or falls off after 4 days call [employer] at [phone] for  suggestions on securing your monitor.?

## 2020-12-21 MED ORDER — AMPHETAMINE-DEXTROAMPHET ER 20 MG PO CP24: 20.0000 mg | ORAL_CAPSULE | Freq: Every day | ORAL | 0 refills | Status: DC | PRN

## 2020-12-21 NOTE — Addendum Note (Signed)
Addended by: Binnie Rail on: 12/21/2020 04:19 PM   Modules accepted: Orders

## 2020-12-22 DIAGNOSIS — R000 Tachycardia, unspecified: Secondary | ICD-10-CM | POA: Diagnosis not present

## 2020-12-22 DIAGNOSIS — R072 Precordial pain: Secondary | ICD-10-CM

## 2020-12-27 ENCOUNTER — Encounter: Payer: Self-pay | Admitting: Internal Medicine

## 2020-12-27 MED ORDER — PROMETHAZINE HCL 25 MG PO TABS: 25.0000 mg | ORAL_TABLET | Freq: Four times a day (QID) | ORAL | 0 refills | Status: AC | PRN

## 2020-12-27 MED ORDER — ALMOTRIPTAN MALATE 12.5 MG PO TABS: 12.5000 mg | ORAL_TABLET | ORAL | 5 refills | Status: DC | PRN

## 2020-12-27 NOTE — Telephone Encounter (Signed)
Please advise 

## 2021-01-14 ENCOUNTER — Other Ambulatory Visit: Payer: Self-pay

## 2021-01-14 ENCOUNTER — Ambulatory Visit (INDEPENDENT_AMBULATORY_CARE_PROVIDER_SITE_OTHER): Payer: No Typology Code available for payment source

## 2021-01-14 DIAGNOSIS — E538 Deficiency of other specified B group vitamins: Secondary | ICD-10-CM | POA: Diagnosis not present

## 2021-01-14 MED ORDER — CYANOCOBALAMIN 1000 MCG/ML IJ SOLN
1000.0000 ug | Freq: Once | INTRAMUSCULAR | Status: AC
  Administered 2021-01-14: 1000 ug via INTRAMUSCULAR

## 2021-01-14 NOTE — Progress Notes (Signed)
Patient here for monthly b12 injection per Dr. Quay Burow.   B12 1032mcg given left deltoid IM, and patient tolerated injection well.   Next B12 injection scheduled for 02/14/21.

## 2021-01-17 NOTE — Progress Notes (Signed)
b12 Injection given.   Fidela Cieslak J Linsie Lupo, MD  

## 2021-01-21 ENCOUNTER — Encounter: Payer: Self-pay | Admitting: Internal Medicine

## 2021-01-21 ENCOUNTER — Encounter: Payer: Self-pay | Admitting: Hematology

## 2021-01-22 ENCOUNTER — Telehealth (HOSPITAL_COMMUNITY): Payer: Self-pay | Admitting: *Deleted

## 2021-01-22 NOTE — Telephone Encounter (Signed)
Left message on voicemail per DPR in reference to upcoming appointment scheduled on 01/28/21 at 8:15 with detailed instructions given per Myocardial Perfusion Study Information Sheet for the test. LM to arrive 15 minutes early, and that it is imperative to arrive on time for appointment to keep from having the test rescheduled. If you need to cancel or reschedule your appointment, please call the office within 24 hours of your appointment. Failure to do so may result in a cancellation of your appointment, and a $50 no show fee. Phone number given for call back for any questions.

## 2021-01-28 ENCOUNTER — Other Ambulatory Visit: Payer: Self-pay

## 2021-01-28 ENCOUNTER — Ambulatory Visit (HOSPITAL_BASED_OUTPATIENT_CLINIC_OR_DEPARTMENT_OTHER): Payer: No Typology Code available for payment source

## 2021-01-28 ENCOUNTER — Ambulatory Visit (HOSPITAL_COMMUNITY): Payer: No Typology Code available for payment source | Attending: Cardiology

## 2021-01-28 DIAGNOSIS — R072 Precordial pain: Secondary | ICD-10-CM | POA: Diagnosis present

## 2021-01-28 DIAGNOSIS — R000 Tachycardia, unspecified: Secondary | ICD-10-CM | POA: Insufficient documentation

## 2021-01-28 LAB — ECHOCARDIOGRAM COMPLETE
Area-P 1/2: 6.37 cm2
S' Lateral: 2.6 cm

## 2021-01-28 MED ORDER — TECHNETIUM TC 99M TETROFOSMIN IV KIT
32.3000 | PACK | Freq: Once | INTRAVENOUS | Status: AC | PRN
  Administered 2021-01-28: 32.3 via INTRAVENOUS
  Filled 2021-01-28: qty 33

## 2021-01-28 MED ORDER — REGADENOSON 0.4 MG/5ML IV SOLN: 0.4000 mg | Freq: Once | INTRAVENOUS | Status: DC

## 2021-01-29 ENCOUNTER — Ambulatory Visit (HOSPITAL_COMMUNITY): Payer: No Typology Code available for payment source | Attending: Cardiovascular Disease

## 2021-01-29 LAB — MYOCARDIAL PERFUSION IMAGING
Estimated workload: 7 METS
Exercise duration (min): 5 min
Exercise duration (sec): 1 s
LV dias vol: 44 mL (ref 46–106)
LV sys vol: 12 mL
MPHR: 179 {beats}/min
Peak HR: 179 {beats}/min
Percent HR: 100 %
Rest HR: 107 {beats}/min
SDS: 0
SRS: 0
SSS: 0
TID: 0.9

## 2021-01-29 MED ORDER — TECHNETIUM TC 99M TETROFOSMIN IV KIT
30.4000 | PACK | Freq: Once | INTRAVENOUS | Status: AC | PRN
Start: 2021-01-29 — End: 2021-01-29
  Administered 2021-01-29: 30.4 via INTRAVENOUS
  Filled 2021-01-29: qty 31

## 2021-01-30 ENCOUNTER — Encounter: Payer: Self-pay | Admitting: Internal Medicine

## 2021-01-30 MED ORDER — RIZATRIPTAN BENZOATE 10 MG PO TABS: 10.0000 mg | ORAL_TABLET | ORAL | 8 refills | Status: DC | PRN

## 2021-01-30 MED ORDER — AMPHETAMINE-DEXTROAMPHET ER 20 MG PO CP24: 20.0000 mg | ORAL_CAPSULE | Freq: Every day | ORAL | 0 refills | Status: DC | PRN

## 2021-01-30 MED ORDER — ALPRAZOLAM 0.5 MG PO TABS: ORAL_TABLET | ORAL | 0 refills | Status: DC

## 2021-02-12 ENCOUNTER — Encounter: Payer: Self-pay | Admitting: Internal Medicine

## 2021-02-14 ENCOUNTER — Other Ambulatory Visit: Payer: Self-pay

## 2021-02-14 ENCOUNTER — Ambulatory Visit (INDEPENDENT_AMBULATORY_CARE_PROVIDER_SITE_OTHER): Payer: No Typology Code available for payment source

## 2021-02-14 DIAGNOSIS — E538 Deficiency of other specified B group vitamins: Secondary | ICD-10-CM

## 2021-02-14 NOTE — Progress Notes (Signed)
Pt here for monthly B12 injection per Dr Quay Burow.  B12 1028mcg given IM in left deltoid and pt tolerated injection well.  Next B12 injection scheduled for 03/18/21.

## 2021-02-18 ENCOUNTER — Other Ambulatory Visit: Payer: Self-pay

## 2021-02-18 ENCOUNTER — Ambulatory Visit (INDEPENDENT_AMBULATORY_CARE_PROVIDER_SITE_OTHER): Payer: No Typology Code available for payment source

## 2021-02-18 DIAGNOSIS — Z111 Encounter for screening for respiratory tuberculosis: Secondary | ICD-10-CM | POA: Diagnosis not present

## 2021-02-18 NOTE — Progress Notes (Signed)
PPD placed on left arm for screening for TB due to recent exposure.  Pt tolerated well.  Appt scheduled for reading on Weds 7/13

## 2021-02-20 ENCOUNTER — Ambulatory Visit: Payer: No Typology Code available for payment source

## 2021-02-20 ENCOUNTER — Other Ambulatory Visit: Payer: Self-pay

## 2021-02-20 DIAGNOSIS — Z111 Encounter for screening for respiratory tuberculosis: Secondary | ICD-10-CM

## 2021-02-20 LAB — TB SKIN TEST
Induration: 0 mm
TB Skin Test: NEGATIVE

## 2021-02-20 NOTE — Progress Notes (Signed)
Pts TB test is Negative with no redness or raised areas to the site of the injection.

## 2021-02-21 ENCOUNTER — Encounter: Payer: Self-pay | Admitting: Internal Medicine

## 2021-02-21 ENCOUNTER — Ambulatory Visit (INDEPENDENT_AMBULATORY_CARE_PROVIDER_SITE_OTHER): Payer: No Typology Code available for payment source | Admitting: Internal Medicine

## 2021-02-21 VITALS — BP 122/82 | HR 110 | Ht 64.0 in | Wt 238.4 lb

## 2021-02-21 DIAGNOSIS — E1149 Type 2 diabetes mellitus with other diabetic neurological complication: Secondary | ICD-10-CM

## 2021-02-21 DIAGNOSIS — Z6839 Body mass index (BMI) 39.0-39.9, adult: Secondary | ICD-10-CM

## 2021-02-21 DIAGNOSIS — E785 Hyperlipidemia, unspecified: Secondary | ICD-10-CM | POA: Diagnosis not present

## 2021-02-21 LAB — POCT GLYCOSYLATED HEMOGLOBIN (HGB A1C): Hemoglobin A1C: 7.5 % — AB (ref 4.0–5.6)

## 2021-02-21 NOTE — Patient Instructions (Addendum)
Please continue: - Metformin 1000 mg 2x a day with meals  Please change:  - NPH 26-28 units and 22-24 units at bedtime - Regular insulin 8 units before B and L and 8-12 units before D  Please schedule an appt with Antonieta Iba with nutrition.  Please return in 3-4 months.

## 2021-02-21 NOTE — Progress Notes (Signed)
=ubjective:     Patient ID: Lisa Duran, female   DOB: 07-22-1981, 40 y.o.   MRN: [id number]  This visit occurred during the SARS-CoV-2 public health emergency.  Safety protocols were in place, including screening questions prior to the visit, additional usage of staff PPE, and extensive cleaning of exam room while observing appropriate contact time as indicated for disinfecting solutions.   HPI Lisa Duran is a pleasant 40 y.o. woman returning for f/u for DM2, insulin dependent, uncontrolled, dx 34/7425, w/o complications. Last visit 4 months ago.  Interim history: No increased urination, blurry vision, nausea, chest pain. She feels much better after she started back on iv iron and also started im B12 >> she feels like a different person! Appetite is also better.  Sugars improved.  Reviewed HbA1c levels: Lab Results  Component Value Date   HGBA1C 7.6 (A) 10/16/2020   HGBA1C 8.6 (A) 06/12/2020   HGBA1C 8.5 (A) 11/07/2019   HGBA1C 8.4 (A) 08/09/2019   HGBA1C 9.1 (A) 04/14/2019   HGBA1C 9.0 (H) 10/20/2018   HGBA1C 7.2 (A) 05/27/2018   HGBA1C 9.4 (A) 01/22/2018   HGBA1C 6.2 05/25/2017   HGBA1C 7.6 12/25/2016   She is on:   - Metformin 1000 mg 2x a day >> 2000 mg with dinner >> 1000 mg twice a day - NPH 30 units 2x a day >> 40 units in a.m. and 22-24 units at bedtime >> 34 units and 26-30 units at bedtime - Regular  15-17 units 3 times a day before meals >> 10-14 (18) units before B and L and 4-8 units before D We stopped glipizide ER and also Januvia. We tried Cycloset 0.8 mg daily >> stopped end of 11/2016 b/c GERD/AP/C We tried Trulicity >> could not tolerate it: N/V/AP. She restarted Januvia 09/2016. She was on Invokana 100 mg in am (started 12/2014) >> 2 yeast inf >> tx with Diflucan; she was exhausted and had nocturia >> stopped 03/12/2015 Tried Victoza 2 mo ago >> AP, severe GERD, inj site rxn Tried Januvia >> tolerated it well, but stopped when Invokana was suggested.  She was  also on Actos, but taken off b/c good control. She had problems tolerating insulins in the past.  She was previously on Lantus and NovoLog.  Basaglar and [place] caused eczema and also GI symptoms.  She has a [employer], structural.  She checks her sugars more than 4 times a day with her CGM.    Previously:  She has hypoglycemia awareness in the 80s.  No CKD: Lab Results  Component Value Date   BUN 7 11/06/2020   Lab Results  Component Value Date   CREATININE 0.75 11/06/2020   No MAU: Lab Results  Component Value Date   MICRALBCREAT 28 05/09/2020   MICRALBCREAT 0.8 10/20/2018   MICRALBCREAT 0.5 02/03/2017   MICRALBCREAT 0.7 12/20/2015   MICRALBCREAT 0.8 12/19/2014   + HL: Lab Results  Component Value Date   CHOL 151 11/06/2020   HDL 67.80 11/06/2020   LDLCALC 71 11/06/2020   TRIG 64.0 11/06/2020   CHOLHDL 2 11/06/2020  Not on a statin  Last eye exam 2022: reportedly No DR- [facility] in [place] [facility].  + Numbness and tingling in hand and feet.  She feels much better on Cymbalta and Neurontin.  She had CP >> had a stress test 07/04/2015 >>  Normal.  She is on metoprolol for tachycardia. She has fibromyalgia.   She also has a history of iron deficiency anemia  and had iron infusions.   In 2019, she just got out of a 5-year relationship.  She was under a lot of stress.  She is on Cymbalta for anxiety.  Review of Systems + see HPI  Past Medical History:  Diagnosis Date   ADHD (attention deficit hyperactivity disorder)    ALLERGIC RHINITIS    Allergy    Anemia    ANXIETY    Depression    DIABETES MELLITUS, TYPE II    Fibromyalgia 2018   GERD (gastroesophageal reflux disease)    MIGRAINE, COMMON    Obesity, unspecified    OVARIAN CYST    Past Surgical History:  Procedure Laterality Date   NO PAST SURGERIES     Social History   Socioeconomic History   Marital status: Single    Spouse name: Not on file   Number of children: Not on file   Years  of education: Not on file   Highest education level: Not on file  Occupational History   Not on file  Tobacco Use   Smoking status: Former    Types: Cigarettes    Quit date: 08/20/2002    Years since quitting: 18.5   Smokeless tobacco: Never   Tobacco comments:    single, separated from spouse 02/2010.   Vaping Use   Vaping Use: Never used  Substance and Sexual Activity   Alcohol use: Yes    Comment: couple of drinks a week   Drug use: No   Sexual activity: Not Currently    Birth control/protection: I.U.D.  Other Topics Concern   Not on file  Social History Narrative   Single, separated from spouse 02/2010   Social Determinants of Health   Financial Resource Strain: Not on file  Food Insecurity: Not on file  Transportation Needs: Not on file  Physical Activity: Not on file  Stress: Not on file  Social Connections: Not on file  Intimate Partner Violence: Not on file   Current Outpatient Medications on File Prior to Visit  Medication Sig Dispense Refill   ALPRAZolam (XANAX) 0.5 MG tablet TAKE 1 TABLET(0.5 MG) BY MOUTH THREE TIMES DAILY AS NEEDED FOR ANXIETY 60 tablet 0   amphetamine-dextroamphetamine (ADDERALL XR) 20 MG 24 hr capsule Take 1 capsule (20 mg total) by mouth daily as needed (school or work days). 30 capsule 0   Continuous Blood Gluc Receiver (FREESTYLE LIBRE 2 READER) DEVI USE AS DIRECTED 1 each 0   Continuous Blood Gluc Sensor (FREESTYLE LIBRE 2 SENSOR) MISC APPLY EVERY 14 DAYS 6 each 3   DULoxetine (CYMBALTA) 30 MG capsule Take 1 capsule (30 mg total) by mouth daily. Take in addition to 60 mg cap for total of 90 mg daily 90 capsule 1   DULoxetine (CYMBALTA) 60 MG capsule TAKE 1 CAPSULE BY MOUTH EVERY DAY 90 capsule 1   EPINEPHrine 0.3 mg/0.3 mL IJ SOAJ injection SMARTSIG:0.3 Milligram(s) IM Once PRN     Fexofenadine HCl (ALLEGRA ALLERGY PO) Take by mouth.     gabapentin (NEURONTIN) 100 MG capsule Take 2 capsules (200 mg total) by mouth at bedtime. 180 capsule 1    glucose blood (ONETOUCH VERIO) test strip Use 2x a day 200 each 3   insulin NPH Human (NOVOLIN N) 100 UNIT/ML injection Inject 20-40 Units into the skin. 40 units in am and 24 units at night time     insulin regular (NOVOLIN R RELION) 100 units/mL injection Inject 0.1-0.16 mLs (10-16 Units total) into the skin 3 (three) times  daily before meals. (Patient taking differently: Inject 15-20 Units into the skin 3 (three) times daily before meals. Inject 15-20 units into the skin 3 times daily before meals) 10 mL 11   Insulin Syringe-Needle U-100 (B-D INS SYR ULTRAFINE .5CC/30G) 30G X 1/2" 0.5 ML MISC Use to inject insulin once daily. 100 each 11   levonorgestrel (MIRENA) 20 MCG/24HR IUD 1 Intra Uterine Device (1 each total) by Intrauterine route once. 1 each 0   metFORMIN (GLUCOPHAGE) 1000 MG tablet TAKE 2 TABLETS BY MOUTH DAILY WITH SUPPER. TAKE 1 TABLET BY MOUTH TWICE A DAY WITH A MEAL 180 tablet 3   metoprolol succinate (TOPROL-XL) 25 MG 24 hr tablet Take 1 tablet (25 mg total) by mouth daily. 90 tablet 1   omeprazole (PRILOSEC) 40 MG capsule TAKE 1 CAPSULE (40 MG TOTAL) BY MOUTH DAILY BEFORE SUPPER. 90 capsule 1   ondansetron (ZOFRAN ODT) 4 MG disintegrating tablet Take 1 tablet (4 mg total) by mouth every 8 (eight) hours as needed for nausea or vomiting. 20 tablet 0   promethazine (PHENERGAN) 25 MG tablet Take 1 tablet (25 mg total) by mouth every 6 (six) hours as needed for nausea. 15 tablet 0   rizatriptan (MAXALT) 10 MG tablet Take 1 tablet (10 mg total) by mouth as needed for migraine. May repeat in 2 hours if needed 10 tablet 8   rosuvastatin (CRESTOR) 5 MG tablet TAKE 1 TABLET (5 MG TOTAL) BY MOUTH 2 (TWO) TIMES A WEEK. 13 tablet 3   triamcinolone cream (KENALOG) 0.1 % Apply 1 application topically 2 (two) times daily as needed (for itching). 28.4 g 1   Current Facility-Administered Medications on File Prior to Visit  Medication Dose Route Frequency Provider Last Rate Last Admin    cyanocobalamin ((VITAMIN B-12)) injection 1,000 mcg  1,000 mcg Intramuscular Q30 days Binnie Rail, MD   1,000 mcg at 02/14/21 0846   regadenoson (LEXISCAN) injection SOLN 0.4 mg  0.4 mg Intravenous Once Nahser, Wonda Cheng, MD       Allergies  Allergen Reactions   Chicken Allergy Anaphylaxis and Shortness Of Breath   Covid-19 Mrna Vacc (Moderna) Anaphylaxis   Other Itching, Swelling and Other (See Comments)    NUTS (of ANY kind): Lips and mouth swell, but no breathing impairment & migraines and eczema are triggered   Propofol Shortness Of Breath    SOB, chest tightness, wheezing after colonoscopy on 09-18-15   Basaglar Kwikpen [Insulin Glargine] Palpitations and Hypertension   Adhesive [Tape] Other (See Comments)    Makes the skin VERY RED    Gluten Meal Diarrhea   Peanut-Containing Drug Products Itching, Swelling and Other (See Comments)    Lips and mouth swell, but no breathing impairment & migraines and eczema are triggered    Novolog [Insulin Aspart] Rash   Tyler Aas [Insulin Degludec] Rash    eczema   Victoza [Liraglutide] Rash   Family History  Problem Relation Age of Onset   Diabetes Mother    Hyperlipidemia Mother    CAD Mother    CVA Mother    Diabetes Father    Hyperlipidemia Father    Coronary artery disease Father    Hypertension Father    Cancer Father        esoph   Stroke Father    Cancer Maternal Grandfather 17       Prostate and Lung   Diabetes Sister        Gestational   Graves' disease Maternal Aunt  Cancer Maternal Uncle        Brain   Stroke Paternal Grandmother    Heart disease Paternal Grandmother    Stroke Paternal Grandfather    Heart disease Paternal Grandfather    Diabetes Sister        Gestational and Type II   Hashimoto's thyroiditis Cousin    Cancer Maternal Uncle        Brain       Objective:   Physical Exam BP 122/82 (BP Location: Right Arm, Patient Position: Sitting, Cuff Size: Normal)   Pulse (!) 110   Ht 5\' 4"  (1.626 m)    Wt 238 lb 6.4 oz (108.1 kg)   SpO2 98%   BMI 40.92 kg/m   Wt Readings from Last 3 Encounters:  02/21/21 238 lb 6.4 oz (108.1 kg)  01/28/21 238 lb (108 kg)  12/19/20 238 lb (108 kg)   Constitutional: overweight, in NAD Eyes: PERRLA, EOMI, no exophthalmos ENT: moist mucous membranes, no thyromegaly, no cervical lymphadenopathy Cardiovascular: RRR, No MRG Respiratory: CTA B Gastrointestinal: abdomen soft, NT, ND, BS+ Musculoskeletal: no deformities, strength intact in all 4 Skin: moist, warm, no rashes Neurological: no tremor with outstretched hands, DTR normal in all 4  Assessment:     1. DM2, insulin-dependent, uncontrolled, without complications - r/o autoimmunity Component     Latest Ref Rng 08/17/2012  Glutamic Acid Decarb Ab     <=1.0 U/mL <1.0  Pancreatic Islet Cell Antibody     < 5 JDF Units <5  C-Peptide     0.80 - 3.90 ng/mL 1.84  Glucose     70 - 99 mg/dL 104 (H)     2. Obesity class II BMI Classification: < 18.5 underweight  18.5-24.9 normal weight  25.0-29.9 overweight  30.0-34.9 class I obesity  35.0-39.9 class II obesity  ? 40.0 class III obesity   3. HL  Plan:     1. Patient with longstanding, uncontrolled, type 2 diabetes, on metformin, and basal-bolus insulin regimen, with improved control at this visit. -At last visit, her sugars are very fluctuating on NPH and regular insulin.  Unfortunately, we could not use SGLT2 inhibitor and GLP-1 receptor agonist due to previous intolerance.  We also had to switch from NovoLog to regular insulin due to rash.  She had intolerances to other insulins so we have to continue with NPH and regular insulin.  We adjusted the doses at last visit to improve her blood sugar fluctuations.  We also split metformin into doses as she was having GI symptoms.  She felt that this helped significantly. CGM interpretation: -At today's visit, we reviewed her CGM downloads: It appears that 80% of values are in target range (goal >70%),  while 18% are higher than 180 (goal <25%), and 2% are lower than 70 (goal <4%).  The calculated average blood sugar is 140.  The projected HbA1c for the next 3 months (GMI) is 6.7%. -Reviewing the CGM trends, it appears that her sugars are much less fluctuating compared to before.  She is barely having any blood sugars under 70s now but she still has some higher blood sugars from 4 to 7 AM and also after dinner.  Upon questioning, she is occasionally missing the NPH at night as sugars were dropping too low on the regular dose.  Therefore, we discussed about decreasing the dose of NPH at night and, since it appears that she also can drop her sugars in the middle of the day, I also advised her  to decrease her morning NPH dose.  I believe that the doses of regular insulin are adequate.  At next visit, I am hoping that we can decrease her NPH doses further. -At this visit, she is interested in a referral to nutrition.  This was placed today. -We also discussed that now that she is feeling better, we can wait a little bit more and then may be retry a GLP-1 receptor agonist, possibly at next visit now that her GI symptoms have resolved.  She agrees to this. - I suggested to:  Patient Instructions  Please continue: - Metformin 1000 mg 2x a day with meals  Please change:  - NPH 26-28 units and 22-24 units at bedtime - Regular insulin 8 units before B and L and 8-12 units before D  Please schedule an appt with Antonieta Iba with nutrition.  Please return in 3-4 months.  - we checked her HbA1c: 7.5% (improved) - advised to check sugars at different times of the day - 4x a day, rotating check times - advised for yearly eye exams >> she is UTD - return to clinic in 3-4 months    2. Obesity class II -She gained 8 pounds before last visit and gained 3 pounds since then -We will continue metformin which is weight stabilizing long-term.  She tolerates the split dose much better. -Unfortunately, we cannot use  an SGLT2 inhibitor or a GLP-1 receptor agonist for now due to previous intolerances.  These would have helped with weight loss.  I am hoping that we can possibly retry this in the future.  3. HL -Reviewed latest lipid panel from 10/2020: Fractions at goal: Lab Results  Component Value Date   CHOL 151 11/06/2020   HDL 67.80 11/06/2020   LDLCALC 71 11/06/2020   TRIG 64.0 11/06/2020   CHOLHDL 2 11/06/2020  -She is not on a statin  Philemon Kingdom, MD PhD [facility] Endocrinology

## 2021-03-08 ENCOUNTER — Ambulatory Visit (INDEPENDENT_AMBULATORY_CARE_PROVIDER_SITE_OTHER): Payer: No Typology Code available for payment source | Admitting: Cardiovascular Disease

## 2021-03-08 ENCOUNTER — Encounter: Payer: Self-pay | Admitting: Internal Medicine

## 2021-03-08 ENCOUNTER — Encounter: Payer: Self-pay | Admitting: Cardiovascular Disease

## 2021-03-08 ENCOUNTER — Other Ambulatory Visit: Payer: Self-pay

## 2021-03-08 VITALS — BP 132/84 | HR 106 | Ht 64.0 in | Wt 238.8 lb

## 2021-03-08 DIAGNOSIS — R072 Precordial pain: Secondary | ICD-10-CM

## 2021-03-08 DIAGNOSIS — R000 Tachycardia, unspecified: Secondary | ICD-10-CM | POA: Diagnosis not present

## 2021-03-08 NOTE — Progress Notes (Signed)
Chief Complaint  Patient presents with   Follow-up    Tachycardia     History of Present Illness: 40 yo female with history of ADHD, anemia, anxiety, depression, diabetes mellitus, fibromyalgia, GERD and migraine headaches who is here today for follow up. I saw her as a new patient 5/11/222 for the evaluation of tachycardia. TSH normal in April 2022. She had been on Toprol. She described dyspnea with exertion and occasional chest pressure. She feels her heart racing. No change when holding Adderall for one month. Less dyspnea after iron infusion. Cardiac monitor June 2022 with sinus and very rare PACs/PVCs. Echo June 2022 with normal LV size and function with no significant valve disease except for mild calcification of the aortic valve. Nuclear stress test June 2022 with no ischemia.   She is here today for follow up. The patient denies any chest pain, dyspnea, palpitations, lower extremity edema, orthopnea, PND, dizziness, near syncope or syncope.   Primary Care Physician: Binnie Rail, MD   Past Medical History:  Diagnosis Date   ADHD (attention deficit hyperactivity disorder)    ALLERGIC RHINITIS    Allergy    Anemia    ANXIETY    Depression    DIABETES MELLITUS, TYPE II    Fibromyalgia 2018   GERD (gastroesophageal reflux disease)    MIGRAINE, COMMON    Obesity, unspecified    OVARIAN CYST     Past Surgical History:  Procedure Laterality Date   NO PAST SURGERIES      Current Outpatient Medications  Medication Sig Dispense Refill   ALPRAZolam (XANAX) 0.5 MG tablet TAKE 1 TABLET(0.5 MG) BY MOUTH THREE TIMES DAILY AS NEEDED FOR ANXIETY 60 tablet 0   amphetamine-dextroamphetamine (ADDERALL XR) 20 MG 24 hr capsule Take 1 capsule (20 mg total) by mouth daily as needed (school or work days). 30 capsule 0   Continuous Blood Gluc Receiver (FREESTYLE LIBRE 2 READER) DEVI USE AS DIRECTED 1 each 0   Continuous Blood Gluc Sensor (FREESTYLE LIBRE 2 SENSOR) MISC APPLY EVERY 14  DAYS 6 each 3   DULoxetine (CYMBALTA) 30 MG capsule Take 1 capsule (30 mg total) by mouth daily. Take in addition to 60 mg cap for total of 90 mg daily 90 capsule 1   DULoxetine (CYMBALTA) 60 MG capsule TAKE 1 CAPSULE BY MOUTH EVERY DAY 90 capsule 1   EPINEPHrine 0.3 mg/0.3 mL IJ SOAJ injection SMARTSIG:0.3 Milligram(s) IM Once PRN     Fexofenadine HCl (ALLEGRA ALLERGY PO) Take by mouth.     gabapentin (NEURONTIN) 100 MG capsule Take 2 capsules (200 mg total) by mouth at bedtime. 180 capsule 1   glucose blood (ONETOUCH VERIO) test strip Use 2x a day 200 each 3   insulin NPH Human (NOVOLIN N) 100 UNIT/ML injection Inject 20-40 Units into the skin. 36 units in am and 26 - 30 units at night time     insulin regular (NOVOLIN R RELION) 100 units/mL injection Inject 0.1-0.16 mLs (10-16 Units total) into the skin 3 (three) times daily before meals. (Patient taking differently: Inject 10-16 Units into the skin 3 (three) times daily before meals. Inject 10-16 units into the skin 3 times daily before meals) 10 mL 11   Insulin Syringe-Needle U-100 (B-D INS SYR ULTRAFINE .5CC/30G) 30G X 1/2" 0.5 ML MISC Use to inject insulin once daily. 100 each 11   levonorgestrel (MIRENA) 20 MCG/24HR IUD 1 Intra Uterine Device (1 each total) by Intrauterine route once. 1 each 0  metFORMIN (GLUCOPHAGE) 1000 MG tablet TAKE 2 TABLETS BY MOUTH DAILY WITH SUPPER. TAKE 1 TABLET BY MOUTH TWICE A DAY WITH A MEAL 180 tablet 3   metoprolol succinate (TOPROL-XL) 25 MG 24 hr tablet Take 1 tablet (25 mg total) by mouth daily. 90 tablet 1   omeprazole (PRILOSEC) 40 MG capsule TAKE 1 CAPSULE (40 MG TOTAL) BY MOUTH DAILY BEFORE SUPPER. 90 capsule 1   ondansetron (ZOFRAN ODT) 4 MG disintegrating tablet Take 1 tablet (4 mg total) by mouth every 8 (eight) hours as needed for nausea or vomiting. 20 tablet 0   promethazine (PHENERGAN) 25 MG tablet Take 1 tablet (25 mg total) by mouth every 6 (six) hours as needed for nausea. 15 tablet 0    rizatriptan (MAXALT) 10 MG tablet Take 1 tablet (10 mg total) by mouth as needed for migraine. May repeat in 2 hours if needed 10 tablet 8   rosuvastatin (CRESTOR) 5 MG tablet TAKE 1 TABLET (5 MG TOTAL) BY MOUTH 2 (TWO) TIMES A WEEK. 13 tablet 3   triamcinolone cream (KENALOG) 0.1 % Apply 1 application topically 2 (two) times daily as needed (for itching). 28.4 g 1   Current Facility-Administered Medications  Medication Dose Route Frequency Provider Last Rate Last Admin   cyanocobalamin ((VITAMIN B-12)) injection 1,000 mcg  1,000 mcg Intramuscular Q30 days Binnie Rail, MD   1,000 mcg at 02/14/21 [id number]   Facility-Administered Medications Ordered in Other Visits  Medication Dose Route Frequency Provider Last Rate Last Admin   regadenoson (LEXISCAN) injection SOLN 0.4 mg  0.4 mg Intravenous Once Nahser, Wonda Cheng, MD        Allergies  Allergen Reactions   Chicken Allergy Anaphylaxis and Shortness Of Breath   Covid-19 Mrna Vacc (Moderna) Anaphylaxis   Other Itching, Swelling and Other (See Comments)    NUTS (of ANY kind): Lips and mouth swell, but no breathing impairment & migraines and eczema are triggered   Propofol Shortness Of Breath    SOB, chest tightness, wheezing after colonoscopy on 09-18-15   Basaglar Kwikpen [Insulin Glargine] Palpitations and Hypertension   Adhesive [Tape] Other (See Comments)    Makes the skin VERY RED    Gluten Meal Diarrhea   Peanut-Containing Drug Products Itching, Swelling and Other (See Comments)    Lips and mouth swell, but no breathing impairment & migraines and eczema are triggered    Novolog [Insulin Aspart] Rash   Tyler Aas [Insulin Degludec] Rash    eczema   Victoza [Liraglutide] Rash    Social History   Socioeconomic History   Marital status: Single    Spouse name: Not on file   Number of children: Not on file   Years of education: Not on file   Highest education level: Not on file  Occupational History   Not on file  Tobacco Use    Smoking status: Former    Types: Cigarettes    Quit date: 08/20/2002    Years since quitting: 18.5   Smokeless tobacco: Never   Tobacco comments:    single, separated from spouse 02/2010.   Vaping Use   Vaping Use: Never used  Substance and Sexual Activity   Alcohol use: Yes    Comment: couple of drinks a week   Drug use: No   Sexual activity: Not Currently    Birth control/protection: I.U.D.  Other Topics Concern   Not on file  Social History Narrative   Single, separated from spouse 02/2010   Social Determinants of Health  Financial Resource Strain: Not on file  Food Insecurity: Not on file  Transportation Needs: Not on file  Physical Activity: Not on file  Stress: Not on file  Social Connections: Not on file  Intimate Partner Violence: Not on file    Family History  Problem Relation Age of Onset   Diabetes Mother    Hyperlipidemia Mother    CAD Mother    CVA Mother    Diabetes Father    Hyperlipidemia Father    Coronary artery disease Father    Hypertension Father    Cancer Father        esoph   Stroke Father    Cancer Maternal Grandfather 71       Prostate and Lung   Diabetes Sister        Gestational   Graves' disease Maternal Aunt    Cancer Maternal Uncle        Brain   Stroke Paternal Grandmother    Heart disease Paternal Grandmother    Stroke Paternal Grandfather    Heart disease Paternal Grandfather    Diabetes Sister        Gestational and Type II   Hashimoto's thyroiditis Cousin    Cancer Maternal Uncle        Brain    Review of Systems:  As stated in the HPI and otherwise negative.   BP 132/84   Pulse (!) 106   Ht '5\' 4"'$  (1.626 m)   Wt 238 lb 12.8 oz (108.3 kg)   SpO2 97%   BMI 40.99 kg/m   Physical Examination: General: Well developed, well nourished, NAD  HEENT: OP clear, mucus membranes moist  SKIN: warm, dry. No rashes. Neuro: No focal deficits  Musculoskeletal: Muscle strength 5/5 all ext  Psychiatric: Mood and affect normal   Neck: No JVD, no carotid bruits, no thyromegaly, no lymphadenopathy.  Lungs:Clear bilaterally, no wheezes, rhonci, crackles Cardiovascular: Tachy, regular. No murmurs, gallops or rubs. Abdomen:Soft. Bowel sounds present. Non-tender.  Extremities: No lower extremity edema. Pulses are 2 + in the bilateral DP/PT.  EKG:  EKG is not ordered today. The ekg ordered today demonstrates  Echo 01/28/21:  1. Left ventricular ejection fraction, by estimation, is 65 to 70%. The  left ventricle has normal function. The left ventricle has no regional  wall motion abnormalities. Left ventricular diastolic parameters were  normal. The average left ventricular  global longitudinal strain is -20.9 %. The global longitudinal strain is  normal.   2. Right ventricular systolic function is normal. The right ventricular  size is normal.   3. The mitral valve is normal in structure. No evidence of mitral valve  regurgitation. No evidence of mitral stenosis.   4. The aortic valve is tricuspid. There is mild calcification of the  aortic valve. Aortic valve regurgitation is not visualized. Mild aortic  valve sclerosis is present, with no evidence of aortic valve stenosis.   5. The inferior vena cava is normal in size with greater than 50%  respiratory variability, suggesting right atrial pressure of 3 mmHg.   Recent Labs: 11/06/2020: ALT 22; BUN 7; Creatinine, Ser 0.75; Hemoglobin 10.1; Platelets 500.0; Potassium 4.2; Sodium 137; TSH 4.08   Lipid Panel    Component Value Date/Time   CHOL 151 11/06/2020 0930   TRIG 64.0 11/06/2020 0930   HDL 67.80 11/06/2020 0930   CHOLHDL 2 11/06/2020 0930   VLDL 12.8 11/06/2020 0930   LDLCALC 71 11/06/2020 0930   LDLCALC 102 (H) 05/09/2020 [id number]  Wt Readings from Last 3 Encounters:  03/08/21 238 lb 12.8 oz (108.3 kg)  02/21/21 238 lb 6.4 oz (108.1 kg)  01/28/21 238 lb (108 kg)      Assessment and Plan:   1. Sinus tachycardia: Her tachycardia appears to be  sinus tachycardia. No evidence of arrhythmias on recent cardiac monitor. There were rare PACs and PVCs. Echo with normal LV function.  No further workup. Continue Toprol.   2. Dyspnea/Chest pain: Exercise nuclear stress test with no ischemia. Normal LV function on echo  Current medicines are reviewed at length with the patient today.  The patient does not have concerns regarding medicines.  The following changes have been made:  no change  Labs/ tests ordered today include:   No orders of the defined types were placed in this encounter.    Disposition:   F/u with me in 2 years.    Signed, Lauree Chandler, MD 03/08/2021 2:49 PM    [facility] [place], [place], [place]  [address] Phone: [phone]; Fax: [phone]

## 2021-03-08 NOTE — Patient Instructions (Signed)
Medication Instructions:  Your physician recommends that you continue on your current medications as directed. Please refer to the Current Medication list given to you today.  *If you need a refill on your cardiac medications before your next appointment, please call your pharmacy*   Lab Work: NONE If you have labs (blood work) drawn today and your tests are completely normal, you will receive your results only by: [place] (if you have MyChart) OR A paper copy in the mail If you have any lab test that is abnormal or we need to change your treatment, we will call you to review the results.   Testing/Procedures: NONE   Follow-Up: At [facility], you and your health needs are our priority.  As part of our continuing mission to provide you with exceptional heart care, we have created designated Provider Care Teams.  These Care Teams include your primary Cardiologist (physician) and Advanced Practice Providers (APPs -  Physician Assistants and Nurse Practitioners) who all work together to provide you with the care you need, when you need it.    Your next appointment:   24 month(s)  The format for your next appointment:   In Person  Provider:   You may see Lauree Chandler, MD or one of the following Advanced Practice Providers on your designated Care Team:   Melina Copa, PA-C Ermalinda Barrios, PA-C

## 2021-03-09 MED ORDER — AMPHETAMINE-DEXTROAMPHET ER 20 MG PO CP24: 20.0000 mg | ORAL_CAPSULE | Freq: Every day | ORAL | 0 refills | Status: DC | PRN

## 2021-03-13 ENCOUNTER — Ambulatory Visit: Payer: No Typology Code available for payment source | Admitting: Cardiovascular Disease

## 2021-03-18 ENCOUNTER — Ambulatory Visit (INDEPENDENT_AMBULATORY_CARE_PROVIDER_SITE_OTHER): Payer: No Typology Code available for payment source

## 2021-03-18 ENCOUNTER — Other Ambulatory Visit: Payer: Self-pay

## 2021-03-18 DIAGNOSIS — E538 Deficiency of other specified B group vitamins: Secondary | ICD-10-CM

## 2021-03-18 MED ORDER — CYANOCOBALAMIN 1000 MCG/ML IJ SOLN
1000.0000 ug | Freq: Once | INTRAMUSCULAR | Status: AC
Start: 2021-03-18 — End: 2021-03-18
  Administered 2021-03-18: 1000 ug via INTRAMUSCULAR

## 2021-03-18 NOTE — Progress Notes (Signed)
B12 given w/o any complications. 

## 2021-04-12 ENCOUNTER — Other Ambulatory Visit: Payer: Self-pay

## 2021-04-12 ENCOUNTER — Encounter: Payer: No Typology Code available for payment source | Attending: Internal Medicine | Admitting: Dietician

## 2021-04-12 ENCOUNTER — Encounter: Payer: Self-pay | Admitting: Dietician

## 2021-04-12 DIAGNOSIS — E1149 Type 2 diabetes mellitus with other diabetic neurological complication: Secondary | ICD-10-CM | POA: Diagnosis present

## 2021-04-12 NOTE — Patient Instructions (Addendum)
Increase your water intake Consider getting your vitamin D checked  Less processed foods More whole grains Fresh fruits and vegetables Legumes  Consider easy meals  Grain bowls  Using similar vegetables in different ways (raw, roasted, stir fried, different ethnic ways.  Soup  Salad with beans or marinated tofu, add fruit on the side or on the salad  Continue mindfulness  Each meal aim for 30 grams of carbs (more if needed for energy, etc)  Protein choice every meal:  vegeburger, legumes, tofu  A little bit of meal of meal planning goes a long way.  Resource:   Mastering Diabetes: The Revolutionary Method to Reverse Insulin Resistance Permanently in Type 1, Type 1.5, Type 2, Prediabetes, and Gestational Diabetes by Jenny Reichmann  (Author), Hildred Laser Artist)

## 2021-04-12 NOTE — Progress Notes (Signed)
Medical Nutrition Therapy  Appointment Start time:  [id number]  Appointment End time:  1555 Patient is here today alone  Primary concerns today: she would like to learn how to better pair protein and carbohydrates, the amounts of carbohydrates per meal to help with weight loss or stop weight gain, and how to improve overall health with nutrition   Referral diagnosis: Type 2 diabetes  Preferred learning style: no preference indicated Learning readiness:  ready, change in progress   NUTRITION ASSESSMENT   Anthropometrics  64.5" 235 lbs 04/12/2021 Lost 25 lbs the year several people died, bought a house and got a divorce.    She states that she has never been able to lose weight and weight has continued to increase over time. 235 lbs highest adult weight 170 lbs age 40 when diagnosed with diabetes was lowest adult weight  Clinical Medical Hx: Type 2 diabetes, GERD, ADHD, fibromyalgia Medications: see list to include Metformin, Novolin N 28 units q am, Novolin R 6-8 units prior to breakfast and lunch and 8 units prior to dinner Labs: A1C 7.5% 02/21/21 and 7.6% 10/16/20, Vitamin B-12 211 11/06/2020 FreeStyle Libre - fasting sensor reading:  168, after meals 150-180 Lows twice this week due to work being busy and unable to eat Notable Signs/Symptoms: continued weight gain.  Improved energy on vitamin B-12 injections and iron infusions  Lifestyle & Dietary Hx Patient lives alone.   Her mother has recently been diagnosed with Alzheimer's disease and she is helping with her needs as well.  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  She is also a Secondary school teacher. She states that she does not like cooking for herself and only for other people. She has never cared for meat.  Eats more plant based as this is good for her health.  Allergies:  Gluten (GI symptoms), Chicken (anaphylaxis), peanuts, tree nuts. Limits dairy   Estimated daily fluid  intake:  50 oz Supplements: Vitamin B-12 injections monthly Sleep: varies quality (6-8 hours per night on good nights) Stress / self-care: high with job changes and mother's care Current average weekly physical activity: walks dog and property 30 minutes twice per day unless she is having a bad fibromyalgia flair  24-Hr Dietary Recall First Meal: GF, vegan frozen sausage burrito, GF pretzels Snack: none Second Meal: leftover GF pizza, raw broccoli often with cucumber ranch dressing OR plant based frozen meals Snack: occasional plain cheerios Third Meal: broccoli and cucumber ranch dressing, grapes, cheese OR EVOL frozen meals  Snack: GF pretzels, cheese, grapes Beverages: water, diet gingerale, sugar free iced coffee  Estimated Energy Needs Calories: 1700-2000 Protein: 70g  NUTRITION DIAGNOSIS  NB-1.1 Food and nutrition-related knowledge deficit As related to balance of carbohydrate, protein, and fat.  As evidenced by diet hx and patient report.  NUTRITION INTERVENTION  Nutrition education (E-1) on the following topics:  Adequate hydration Vitamin B-12 status and symptom improvement Benefits of fewer processed foods, whole grains, fresh fruits, vegetables, legumes. Discussion of insulin resistance, benefits of continued exercise and lower fat/saturated fat intake. Weight management, goals, and behavior change Simple meal planning ideas Sources of plant based protein's and discussion of carbohydrates and minimal amounts per meal. Resources for plant based meals  Handouts Provided Include  Meal plan card Diabetes resource sheet  Learning Style & Readiness for Change Teaching method utilized: Visual & Auditory  Demonstrated degree of understanding via: Teach Back  Barriers to learning/adherence to lifestyle change:  none  Plan Increase your water intake Consider getting your vitamin D checked  Less processed foods More whole grains Fresh fruits and  vegetables Legumes  Consider easy meals  Grain bowls  Using similar vegetables in different ways (raw, roasted, stir fried, different ethnic ways.  Soup  Salad with beans or marinated tofu, add fruit on the side or on the salad  Continue mindfulness  Each meal aim for 30 grams of carbs (more if needed for energy, etc)  Protein choice every meal:  vegeburger, legumes, tofu  A little bit of meal of meal planning goes a long way.  Resource:   Mastering Diabetes: The Revolutionary Method to Reverse Insulin Resistance Permanently in Type 1, Type 1.5, Type 2, Prediabetes, and Gestational Diabetes by Jenny Reichmann  (Author), Hildred Laser (Author)   MONITORING & EVALUATION Dietary intake, weekly physical activity prn.  Next Steps  Patient is to call for questions.

## 2021-04-18 ENCOUNTER — Ambulatory Visit (INDEPENDENT_AMBULATORY_CARE_PROVIDER_SITE_OTHER): Payer: No Typology Code available for payment source

## 2021-04-18 ENCOUNTER — Other Ambulatory Visit: Payer: Self-pay

## 2021-04-18 DIAGNOSIS — E538 Deficiency of other specified B group vitamins: Secondary | ICD-10-CM | POA: Diagnosis not present

## 2021-04-18 MED ORDER — CYANOCOBALAMIN 1000 MCG/ML IJ SOLN
1000.0000 ug | Freq: Once | INTRAMUSCULAR | Status: AC
  Administered 2021-04-18: 1000 ug via INTRAMUSCULAR

## 2021-04-18 NOTE — Progress Notes (Signed)
B12 injection given w/o any complications.

## 2021-04-19 ENCOUNTER — Encounter: Payer: Self-pay | Admitting: Internal Medicine

## 2021-04-20 MED ORDER — AMPHETAMINE-DEXTROAMPHET ER 20 MG PO CP24: 20.0000 mg | ORAL_CAPSULE | Freq: Every day | ORAL | 0 refills | Status: DC | PRN

## 2021-05-03 ENCOUNTER — Ambulatory Visit: Payer: Self-pay

## 2021-05-03 ENCOUNTER — Ambulatory Visit (INDEPENDENT_AMBULATORY_CARE_PROVIDER_SITE_OTHER): Payer: No Typology Code available for payment source

## 2021-05-03 ENCOUNTER — Ambulatory Visit (INDEPENDENT_AMBULATORY_CARE_PROVIDER_SITE_OTHER): Payer: No Typology Code available for payment source | Admitting: Sports Medicine

## 2021-05-03 ENCOUNTER — Other Ambulatory Visit: Payer: Self-pay

## 2021-05-03 ENCOUNTER — Ambulatory Visit: Payer: No Typology Code available for payment source | Admitting: Sports Medicine

## 2021-05-03 VITALS — BP 130/90 | HR 120 | Ht 64.5 in | Wt 235.0 lb

## 2021-05-03 DIAGNOSIS — M79671 Pain in right foot: Secondary | ICD-10-CM | POA: Diagnosis not present

## 2021-05-03 DIAGNOSIS — S86111A Strain of other muscle(s) and tendon(s) of posterior muscle group at lower leg level, right leg, initial encounter: Secondary | ICD-10-CM

## 2021-05-03 MED ORDER — MELOXICAM 15 MG PO TABS
15.0000 mg | ORAL_TABLET | Freq: Every day | ORAL | 0 refills | Status: DC
Start: 2021-05-03 — End: 2022-05-20

## 2021-05-03 NOTE — Progress Notes (Signed)
Benito Mccreedy D.[place] Phone: [phone]   Assessment and Plan:     1. Posterior tibial strain, right, initial encounter 2. Right foot pain -Acute on chronic, initial sports medicine visit - Acute strain of posterior tibial tendon with history of chronic strain, and new onset Achilles pain and peroneal tendon strain due to overcompensation - X-ray obtained in clinic due to point tenderness of navicular and fifth metatarsal.  My interpretation: No acute fracture, joint space maintained, no significant cortical irregularities - Use lace up ankle brace when active - Start meloxicam 15 mg daily for 2 weeks and may use remainder as needed - Start HEP for ankle exercises and foot yoga - [place] LIMITED JOINT SPACE STRUCTURES LOW RIGHT(NO LINKED CHARGES); Future - DG Foot Complete Right; Future    Pertinent previous records reviewed include none   Follow Up: In 4 weeks if no improvement or worsening of symptoms   Subjective:   I, Judy Pimple, am serving as a scribe for Dr. Glennon Mac  Chief Complaint: right foot pain  HPI: 40 year old female presenting with right foot pain.  05/03/21 Patient states a couple months ago had a decent R foot sprain but more recently stepped off a curb and had a shooting pain from mid arch right foot that shot up the ankle, then Thursday night her R foot arch to her bid toe was numb and tingling, the numbness since then has started to radiate to her heel. Patient limps and gait is off, patient having muscle spasms in right arch.   Radiates: yes  Swelling: no Aggravates: walking, cannot get on ball of foot, steps  Treatments tried: wearing appropriate shoe, rolling a cold bottle on arch, stretches for feet and calves,     Relevant Historical Information: several sprains and injuries to R foot  Additional pertinent review of systems negative.   Current Outpatient  Medications:    ALPRAZolam (XANAX) 0.5 MG tablet, TAKE 1 TABLET(0.5 MG) BY MOUTH THREE TIMES DAILY AS NEEDED FOR ANXIETY, Disp: 60 tablet, Rfl: 0   amphetamine-dextroamphetamine (ADDERALL XR) 20 MG 24 hr capsule, Take 1 capsule (20 mg total) by mouth daily as needed (school or work days)., Disp: 30 capsule, Rfl: 0   Continuous Blood Gluc Receiver (FREESTYLE LIBRE 2 READER) DEVI, USE AS DIRECTED, Disp: 1 each, Rfl: 0   Continuous Blood Gluc Sensor (FREESTYLE LIBRE 2 SENSOR) MISC, APPLY EVERY 14 DAYS, Disp: 6 each, Rfl: 3   DULoxetine (CYMBALTA) 30 MG capsule, Take 1 capsule (30 mg total) by mouth daily. Take in addition to 60 mg cap for total of 90 mg daily, Disp: 90 capsule, Rfl: 1   DULoxetine (CYMBALTA) 60 MG capsule, TAKE 1 CAPSULE BY MOUTH EVERY DAY, Disp: 90 capsule, Rfl: 1   EPINEPHrine 0.3 mg/0.3 mL IJ SOAJ injection, , Disp: , Rfl:    Fexofenadine HCl (ALLEGRA ALLERGY PO), Take by mouth., Disp: , Rfl:    gabapentin (NEURONTIN) 100 MG capsule, Take 2 capsules (200 mg total) by mouth at bedtime., Disp: 180 capsule, Rfl: 1   glucose blood (ONETOUCH VERIO) test strip, Use 2x a day, Disp: 200 each, Rfl: 3   insulin NPH Human (NOVOLIN N) 100 UNIT/ML injection, Inject 20-40 Units into the skin. 36 units in am and 26 - 30 units at night time, Disp: , Rfl:    insulin regular (NOVOLIN R RELION) 100 units/mL injection, Inject 0.1-0.16 mLs (10-16 Units total)  into the skin 3 (three) times daily before meals. (Patient taking differently: Inject 10-16 Units into the skin 3 (three) times daily before meals. Inject 10-16 units into the skin 3 times daily before meals), Disp: 10 mL, Rfl: 11   Insulin Syringe-Needle U-100 (B-D INS SYR ULTRAFINE .5CC/30G) 30G X 1/2" 0.5 ML MISC, Use to inject insulin once daily., Disp: 100 each, Rfl: 11   levonorgestrel (MIRENA) 20 MCG/24HR IUD, 1 Intra Uterine Device (1 each total) by Intrauterine route once., Disp: 1 each, Rfl: 0   meloxicam (MOBIC) 15 MG tablet, Take 1 tablet  (15 mg total) by mouth daily., Disp: 30 tablet, Rfl: 0   metFORMIN (GLUCOPHAGE) 1000 MG tablet, TAKE 2 TABLETS BY MOUTH DAILY WITH SUPPER. TAKE 1 TABLET BY MOUTH TWICE A DAY WITH A MEAL, Disp: 180 tablet, Rfl: 3   metoprolol succinate (TOPROL-XL) 25 MG 24 hr tablet, Take 1 tablet (25 mg total) by mouth daily., Disp: 90 tablet, Rfl: 1   omeprazole (PRILOSEC) 40 MG capsule, TAKE 1 CAPSULE (40 MG TOTAL) BY MOUTH DAILY BEFORE SUPPER., Disp: 90 capsule, Rfl: 1   ondansetron (ZOFRAN ODT) 4 MG disintegrating tablet, Take 1 tablet (4 mg total) by mouth every 8 (eight) hours as needed for nausea or vomiting., Disp: 20 tablet, Rfl: 0   promethazine (PHENERGAN) 25 MG tablet, Take 1 tablet (25 mg total) by mouth every 6 (six) hours as needed for nausea., Disp: 15 tablet, Rfl: 0   rizatriptan (MAXALT) 10 MG tablet, Take 1 tablet (10 mg total) by mouth as needed for migraine. May repeat in 2 hours if needed, Disp: 10 tablet, Rfl: 8   rosuvastatin (CRESTOR) 5 MG tablet, TAKE 1 TABLET (5 MG TOTAL) BY MOUTH 2 (TWO) TIMES A WEEK., Disp: 13 tablet, Rfl: 3   triamcinolone cream (KENALOG) 0.1 %, Apply 1 application topically 2 (two) times daily as needed (for itching)., Disp: 28.4 g, Rfl: 1   VITAMIN B1-B12 IM, Inject into the muscle., Disp: , Rfl:  No current facility-administered medications for this visit.  Facility-Administered Medications Ordered in Other Visits:    regadenoson (LEXISCAN) injection SOLN 0.4 mg, 0.4 mg, Intravenous, Once, Nahser, Wonda Cheng, MD   Objective:     Vitals:   05/03/21 1026  BP: 130/90  Pulse: (!) 120  SpO2: 99%  Weight: 235 lb (106.6 kg)  Height: 5' 4.5" (1.638 m)      Body mass index is 39.71 kg/m.    Physical Exam:    Gen: Appears well, nad, nontoxic and pleasant Psych: Alert and oriented, appropriate mood and affect Neuro: sensation intact, strength is 5/5 with df/pf/inv/ev, muscle tone wnl Skin: no susupicious lesions or rashes  Right foot and ankle: no deformity, no  swelling or effusion TTP base of fifth, navicular, along posterior tibial tendon, along Achilles tendon, along peroneal tendon NTTP over fibular head, lat mal, medial mal, ATFL, CFL, deltoid, calcaneous or midfoot ROM DF 30, PF 45, inv/ev intact Negative ant drawer, talar tilt, rotation test, squeeze test. Neg thompson  pain with resisted inversion or eversion    Electronically signed by:  Benito Mccreedy D.[facility] 1:10 PM 05/03/21

## 2021-05-03 NOTE — Patient Instructions (Addendum)
Good to see you  Meloxicam 15mg  daily for two weeks use remaining as needed  Buy an OTC lace up ankle brace Foot exercises given  See me again in 4 weeks as needed

## 2021-05-09 DIAGNOSIS — E538 Deficiency of other specified B group vitamins: Secondary | ICD-10-CM | POA: Insufficient documentation

## 2021-05-09 NOTE — Patient Instructions (Addendum)
  Blood work was ordered.     Flu immunization administered today.     Medications changes include :  none   Your prescription(s) have been submitted to your pharmacy. Please take as directed and contact our office if you believe you are having problem(s) with the medication(s).    Please followup in 6 months

## 2021-05-09 NOTE — Progress Notes (Signed)
Subjective:    Patient ID: Lisa Duran, female    DOB: [birth year], 40 y.o.   MRN: [id number]  This visit occurred during the SARS-CoV-2 public health emergency.  Safety protocols were in place, including screening questions prior to the visit, additional usage of staff PPE, and extensive cleaning of exam room while observing appropriate contact time as indicated for disinfecting solutions.     HPI The patient is here for follow up of their chronic medical problems, including DM, dyslipidemia, migraines, fibromyalgia, GERD, anemia, ADD, depression, anxiety, obesity, B12 def  Doing B12 injections here - will start at home.  Her sister is a Marine scientist and can give her monthly injections at home.  She does feel much better since her last visit because of the B12 injections and having had iron infusions.  Stress level is high, but overall she feels like she is dealing with it well.  She has no concerns.  She did injure her ankle and is currently following with sports medicine-she does not feel like it is getting better and will follow up with them.   Medications and allergies reviewed with patient and updated if appropriate.  Patient Active Problem List   Diagnosis Date Noted   B12 deficiency 05/09/2021   Dyslipidemia 04/14/2019   Enteritis 01/12/2019   Sleep walking disorder 04/06/2017   Iron deficiency anemia 03/27/2017   Fatigue 02/03/2017   Arthralgia 02/03/2017   Fibromyalgia 02/03/2017   GERD (gastroesophageal reflux disease) 06/24/2015   Eczema 06/22/2015   Depression 06/22/2015   Tachycardia 06/16/2014   Paresthesias 06/12/2014   Food allergy    ADHD (attention deficit hyperactivity disorder) 01/27/2011   Anxiety 02/20/2010   ALLERGIC RHINITIS 11/21/2009   OVARIAN CYST 11/21/2009   Diabetes mellitus type 2 with neurological manifestations ([place]) 11/20/2009   Class 2 obesity in adult 11/20/2009   Migraine without aura 11/20/2009    Current Outpatient Medications on  File Prior to Visit  Medication Sig Dispense Refill   ALPRAZolam (XANAX) 0.5 MG tablet TAKE 1 TABLET(0.5 MG) BY MOUTH THREE TIMES DAILY AS NEEDED FOR ANXIETY 60 tablet 0   amphetamine-dextroamphetamine (ADDERALL XR) 20 MG 24 hr capsule Take 1 capsule (20 mg total) by mouth daily as needed (school or work days). 30 capsule 0   Continuous Blood Gluc Receiver (FREESTYLE LIBRE 2 READER) DEVI USE AS DIRECTED 1 each 0   Continuous Blood Gluc Sensor (FREESTYLE LIBRE 2 SENSOR) MISC APPLY EVERY 14 DAYS 6 each 3   DULoxetine (CYMBALTA) 30 MG capsule Take 1 capsule (30 mg total) by mouth daily. Take in addition to 60 mg cap for total of 90 mg daily 90 capsule 1   DULoxetine (CYMBALTA) 60 MG capsule TAKE 1 CAPSULE BY MOUTH EVERY DAY 90 capsule 1   EPINEPHrine 0.3 mg/0.3 mL IJ SOAJ injection      Fexofenadine HCl (ALLEGRA ALLERGY PO) Take by mouth.     gabapentin (NEURONTIN) 100 MG capsule Take 2 capsules (200 mg total) by mouth at bedtime. 180 capsule 1   glucose blood (ONETOUCH VERIO) test strip Use 2x a day 200 each 3   insulin NPH Human (NOVOLIN N) 100 UNIT/ML injection Inject 20-40 Units into the skin. 36 units in am and 26 - 30 units at night time     insulin regular (NOVOLIN R RELION) 100 units/mL injection Inject 0.1-0.16 mLs (10-16 Units total) into the skin 3 (three) times daily before meals. (Patient taking differently: Inject 10-16 Units into the skin 3 (  three) times daily before meals. Inject 10-16 units into the skin 3 times daily before meals) 10 mL 11   Insulin Syringe-Needle U-100 (B-D INS SYR ULTRAFINE .5CC/30G) 30G X 1/2" 0.5 ML MISC Use to inject insulin once daily. 100 each 11   levonorgestrel (MIRENA) 20 MCG/24HR IUD 1 Intra Uterine Device (1 each total) by Intrauterine route once. 1 each 0   meloxicam (MOBIC) 15 MG tablet Take 1 tablet (15 mg total) by mouth daily. 30 tablet 0   metFORMIN (GLUCOPHAGE) 1000 MG tablet TAKE 2 TABLETS BY MOUTH DAILY WITH SUPPER. TAKE 1 TABLET BY MOUTH TWICE A  DAY WITH A MEAL 180 tablet 3   metoprolol succinate (TOPROL-XL) 25 MG 24 hr tablet Take 1 tablet (25 mg total) by mouth daily. 90 tablet 1   omeprazole (PRILOSEC) 40 MG capsule TAKE 1 CAPSULE (40 MG TOTAL) BY MOUTH DAILY BEFORE SUPPER. 90 capsule 1   ondansetron (ZOFRAN ODT) 4 MG disintegrating tablet Take 1 tablet (4 mg total) by mouth every 8 (eight) hours as needed for nausea or vomiting. 20 tablet 0   promethazine (PHENERGAN) 25 MG tablet Take 1 tablet (25 mg total) by mouth every 6 (six) hours as needed for nausea. 15 tablet 0   rizatriptan (MAXALT) 10 MG tablet Take 1 tablet (10 mg total) by mouth as needed for migraine. May repeat in 2 hours if needed 10 tablet 8   rosuvastatin (CRESTOR) 5 MG tablet TAKE 1 TABLET (5 MG TOTAL) BY MOUTH 2 (TWO) TIMES A WEEK. 13 tablet 3   triamcinolone cream (KENALOG) 0.1 % Apply 1 application topically 2 (two) times daily as needed (for itching). 28.4 g 1   VITAMIN B1-B12 IM Inject into the muscle.     No current facility-administered medications on file prior to visit.    Past Medical History:  Diagnosis Date   ADHD (attention deficit hyperactivity disorder)    ALLERGIC RHINITIS    Allergy    Anemia    ANXIETY    Depression    DIABETES MELLITUS, TYPE II    Fibromyalgia 2018   GERD (gastroesophageal reflux disease)    MIGRAINE, COMMON    Obesity, unspecified    OVARIAN CYST     Past Surgical History:  Procedure Laterality Date   NO PAST SURGERIES      Social History   Socioeconomic History   Marital status: Single    Spouse name: Not on file   Number of children: Not on file   Years of education: Not on file   Highest education level: Not on file  Occupational History   Not on file  Tobacco Use   Smoking status: Former    Types: Cigarettes    Quit date: 08/20/2002    Years since quitting: 18.7   Smokeless tobacco: Never   Tobacco comments:    single, separated from spouse 02/2010.   Vaping Use   Vaping Use: Never used   Substance and Sexual Activity   Alcohol use: Yes    Comment: couple of drinks a week   Drug use: No   Sexual activity: Not Currently    Birth control/protection: I.U.D.  Other Topics Concern   Not on file  Social History Narrative   Single, separated from spouse 02/2010   Social Determinants of Health   Financial Resource Strain: Not on file  Food Insecurity: Not on file  Transportation Needs: Not on file  Physical Activity: Not on file  Stress: Not on file  Social  Connections: Not on file    Family History  Problem Relation Age of Onset   Diabetes Mother    Hyperlipidemia Mother    CAD Mother    CVA Mother    Diabetes Father    Hyperlipidemia Father    Coronary artery disease Father    Hypertension Father    Cancer Father        esoph   Stroke Father    Cancer Maternal Grandfather 20       Prostate and Lung   Diabetes Sister        Gestational   Graves' disease Maternal Aunt    Cancer Maternal Uncle        Brain   Stroke Paternal Grandmother    Heart disease Paternal Grandmother    Stroke Paternal Grandfather    Heart disease Paternal Grandfather    Diabetes Sister        Gestational and Type II   Hashimoto's thyroiditis Cousin    Cancer Maternal Uncle        Brain    Review of Systems  Constitutional:  Negative for chills and fever.  Respiratory:  Negative for cough, shortness of breath and wheezing.   Cardiovascular:  Positive for palpitations (less). Negative for chest pain and leg swelling.  Neurological:  Positive for headaches (migriaines - infreq, tension headache). Negative for dizziness and light-headedness (intermittent - les freq).      Objective:   Vitals:   05/10/21 0837  BP: 118/78  Pulse: (!) 102  Temp: 98.1 F (36.7 C)  SpO2: 95%   BP Readings from Last 3 Encounters:  05/10/21 118/78  05/03/21 130/90  03/08/21 132/84   Wt Readings from Last 3 Encounters:  05/10/21 234 lb (106.1 kg)  05/03/21 235 lb (106.6 kg)  04/12/21 235  lb (106.6 kg)   Body mass index is 40.17 kg/m.   Physical Exam    Constitutional: Appears well-developed and well-nourished. No distress.  HENT:  Head: Normocephalic and atraumatic.  Neck: Neck supple. No tracheal deviation present. No thyromegaly present.  No cervical lymphadenopathy Cardiovascular: Normal rate, regular rhythm and normal heart sounds.   No murmur heard. No carotid bruit .  No edema Pulmonary/Chest: Effort normal and breath sounds normal. No respiratory distress. No has no wheezes. No rales.  Skin: Skin is warm and dry. Not diaphoretic.  Psychiatric: Normal mood and affect. Behavior is normal.      Assessment & Plan:   Flu vaccine today  See Problem List for Assessment and Plan of chronic medical problems.

## 2021-05-10 ENCOUNTER — Ambulatory Visit (INDEPENDENT_AMBULATORY_CARE_PROVIDER_SITE_OTHER): Payer: No Typology Code available for payment source | Admitting: Internal Medicine

## 2021-05-10 ENCOUNTER — Ambulatory Visit: Payer: Self-pay

## 2021-05-10 ENCOUNTER — Encounter: Payer: Self-pay | Admitting: Internal Medicine

## 2021-05-10 ENCOUNTER — Ambulatory Visit (INDEPENDENT_AMBULATORY_CARE_PROVIDER_SITE_OTHER): Payer: No Typology Code available for payment source | Admitting: Sports Medicine

## 2021-05-10 ENCOUNTER — Other Ambulatory Visit: Payer: Self-pay

## 2021-05-10 VITALS — BP 118/78 | HR 102 | Ht 64.0 in | Wt 234.0 lb

## 2021-05-10 VITALS — BP 118/78 | HR 102 | Temp 98.1°F | Ht 64.0 in | Wt 234.0 lb

## 2021-05-10 DIAGNOSIS — F900 Attention-deficit hyperactivity disorder, predominantly inattentive type: Secondary | ICD-10-CM

## 2021-05-10 DIAGNOSIS — E785 Hyperlipidemia, unspecified: Secondary | ICD-10-CM

## 2021-05-10 DIAGNOSIS — S86111D Strain of other muscle(s) and tendon(s) of posterior muscle group at lower leg level, right leg, subsequent encounter: Secondary | ICD-10-CM

## 2021-05-10 DIAGNOSIS — E1149 Type 2 diabetes mellitus with other diabetic neurological complication: Secondary | ICD-10-CM | POA: Diagnosis not present

## 2021-05-10 DIAGNOSIS — R000 Tachycardia, unspecified: Secondary | ICD-10-CM

## 2021-05-10 DIAGNOSIS — G43009 Migraine without aura, not intractable, without status migrainosus: Secondary | ICD-10-CM

## 2021-05-10 DIAGNOSIS — E538 Deficiency of other specified B group vitamins: Secondary | ICD-10-CM | POA: Diagnosis not present

## 2021-05-10 DIAGNOSIS — M79671 Pain in right foot: Secondary | ICD-10-CM

## 2021-05-10 DIAGNOSIS — Z6839 Body mass index (BMI) 39.0-39.9, adult: Secondary | ICD-10-CM

## 2021-05-10 DIAGNOSIS — D508 Other iron deficiency anemias: Secondary | ICD-10-CM

## 2021-05-10 DIAGNOSIS — F419 Anxiety disorder, unspecified: Secondary | ICD-10-CM

## 2021-05-10 DIAGNOSIS — F3289 Other specified depressive episodes: Secondary | ICD-10-CM

## 2021-05-10 DIAGNOSIS — K219 Gastro-esophageal reflux disease without esophagitis: Secondary | ICD-10-CM

## 2021-05-10 DIAGNOSIS — Z23 Encounter for immunization: Secondary | ICD-10-CM

## 2021-05-10 LAB — COMPREHENSIVE METABOLIC PANEL
ALT: 18 U/L (ref 0–35)
AST: 13 U/L (ref 0–37)
Albumin: 4.1 g/dL (ref 3.5–5.2)
Alkaline Phosphatase: 122 U/L — ABNORMAL HIGH (ref 39–117)
BUN: 10 mg/dL (ref 6–23)
CO2: 25 mEq/L (ref 19–32)
Calcium: 9.4 mg/dL (ref 8.4–10.5)
Chloride: 101 mEq/L (ref 96–112)
Creatinine, Ser: 0.67 mg/dL (ref 0.40–1.20)
GFR: 107.68 mL/min (ref >60.00)
Glucose, Bld: 195 mg/dL — ABNORMAL HIGH (ref 70–99)
Potassium: 3.8 mEq/L (ref 3.5–5.1)
Sodium: 136 mEq/L (ref 135–145)
Total Bilirubin: 0.4 mg/dL (ref 0.2–1.2)
Total Protein: 7.4 g/dL (ref 6.0–8.3)

## 2021-05-10 LAB — VITAMIN B12: Vitamin B-12: 293 pg/mL (ref 211–911)

## 2021-05-10 LAB — CBC WITH DIFFERENTIAL/PLATELET
Basophils Absolute: 0.1 10*3/uL (ref 0.0–0.1)
Basophils Relative: 0.6 % (ref 0.0–3.0)
Eosinophils Absolute: 0.5 10*3/uL (ref 0.0–0.7)
Eosinophils Relative: 4.8 % (ref 0.0–5.0)
HCT: 40.6 % (ref 36.0–46.0)
Hemoglobin: 12.9 g/dL (ref 12.0–15.0)
Lymphocytes Relative: 19.9 % (ref 12.0–46.0)
Lymphs Abs: 1.9 10*3/uL (ref 0.7–4.0)
MCHC: 31.7 g/dL (ref 30.0–36.0)
MCV: 77.4 fl — ABNORMAL LOW (ref 78.0–100.0)
Monocytes Absolute: 0.7 10*3/uL (ref 0.1–1.0)
Monocytes Relative: 7 % (ref 3.0–12.0)
Neutro Abs: 6.6 10*3/uL (ref 1.4–7.7)
Neutrophils Relative %: 67.7 % (ref 43.0–77.0)
Platelets: 389 10*3/uL (ref 150.0–400.0)
RBC: 5.24 Mil/uL — ABNORMAL HIGH (ref 3.87–5.11)
RDW: 17.4 % — ABNORMAL HIGH (ref 11.5–15.5)
WBC: 9.8 10*3/uL (ref 4.0–10.5)

## 2021-05-10 LAB — MICROALBUMIN / CREATININE URINE RATIO
Creatinine,U: 148.9 mg/dL
Microalb Creat Ratio: 0.5 mg/g (ref 0.0–30.0)
Microalb, Ur: <0.7 mg/dL (ref 0.0–1.9)

## 2021-05-10 MED ORDER — METOPROLOL SUCCINATE ER 25 MG PO TB24: 25.0000 mg | ORAL_TABLET | Freq: Every day | ORAL | 1 refills | Status: DC

## 2021-05-10 MED ORDER — CYANOCOBALAMIN 1000 MCG/ML IJ SOLN: 1000.0000 ug | Freq: Once | INTRAMUSCULAR | 0 refills | Status: DC

## 2021-05-10 MED ORDER — "SYRINGE/NEEDLE (DISP) 25G X 1"" 3 ML MISC": 0 refills | Status: DC

## 2021-05-10 MED ORDER — CYANOCOBALAMIN 1000 MCG/ML IJ SOLN: 1000.0000 ug | Freq: Once | INTRAMUSCULAR | 3 refills | Status: AC

## 2021-05-10 NOTE — Assessment & Plan Note (Signed)
Chronic Cardiac evaluation Concerns Continue metoprolol Asked her to monitor her heart rate at home and discussed the importance of keeping her heart rate controlled-we will adjust metoprolol in the future if needed

## 2021-05-10 NOTE — Assessment & Plan Note (Addendum)
Chronic Controlled-having less migraines Continue axert 12.5 mg as needed or otc excedrin migraine

## 2021-05-10 NOTE — Patient Instructions (Addendum)
Good to see you  Cam boot walker for 2 weeks Continue home exercises Continue meloxicam   See me again in 2 weeks

## 2021-05-10 NOTE — Assessment & Plan Note (Signed)
Chronic Lab Results  Component Value Date   HGBA1C 7.5 (A) 02/21/2021   Management per Dr Cruzita Lederer

## 2021-05-10 NOTE — Assessment & Plan Note (Signed)
Chronic Check lipid panel  Continue crestor 5 mg twice weekly Regular exercise and healthy diet encouraged

## 2021-05-10 NOTE — Assessment & Plan Note (Signed)
Chronic Controlled, stable Continue cymbalta 90 mg daily continue xanax 0.5 mg tid prn

## 2021-05-10 NOTE — Assessment & Plan Note (Signed)
Chronic Controlled, stable Continue  Adderall Xr 20 mg daily prn

## 2021-05-10 NOTE — Progress Notes (Signed)
Benito Mccreedy D.[place] [facility] [place] Phone: [phone]   Assessment and Plan:     1. Posterior tibial strain, right, subsequent encounter 2. Right foot pain -Acute on chronic, mild worsening, subsequent visit - Likely posterior tibial strain based on physical exam, HPI, ultrasound, however is not improving with conservative management - Start cam boot walker x2 weeks.  Boot provided - Continue HEP for ankle range of motion - Continue meloxicam 15 mg daily - [place] LIMITED JOINT SPACE STRUCTURES LOW RIGHT(NO LINKED CHARGES)    Pertinent previous records reviewed include previous sports medicine office note, ankle x-ray, primary care office note   Follow Up: In 2 weeks for reevaluation.  If no improvement or worsening of symptoms would consider advanced imaging at that time versus ESI   Subjective:   I, Judy Pimple, am serving as a scribe for Dr. Glennon Mac  Chief Complaint: Right foot pain   HPI:   05/03/21 Patient states a couple months ago had a decent R foot sprain but more recently stepped off a curb and had a shooting pain from mid arch right foot that shot up the ankle, then Thursday night her R foot arch to her bid toe was numb and tingling, the numbness since then has started to radiate to her heel. Patient limps and gait is off, patient having muscle spasms in right arch.    Radiates: yes  Swelling: no Aggravates: walking, cannot get on ball of foot, steps  Treatments tried: wearing appropriate shoe, rolling a cold bottle on arch, stretches for feet and calves,    05/10/21 Patient states that the pain has felt worse, More painful and more constant. Patient feeling like the brace is not helping with the pain when walking. Patient states that the pain at night is better and feels maybe that is the meloxicam working, and also has not had any muscle spasms. When patient walks the pain is sharper and more  intense. Patient is noticing pain now on the top of the foot and in her calf.   Relevant Historical Information: None pertinent  Additional pertinent review of systems negative.   Current Outpatient Medications:    ALPRAZolam (XANAX) 0.5 MG tablet, TAKE 1 TABLET(0.5 MG) BY MOUTH THREE TIMES DAILY AS NEEDED FOR ANXIETY, Disp: 60 tablet, Rfl: 0   amphetamine-dextroamphetamine (ADDERALL XR) 20 MG 24 hr capsule, Take 1 capsule (20 mg total) by mouth daily as needed (school or work days)., Disp: 30 capsule, Rfl: 0   Continuous Blood Gluc Receiver (FREESTYLE LIBRE 2 READER) DEVI, USE AS DIRECTED, Disp: 1 each, Rfl: 0   Continuous Blood Gluc Sensor (FREESTYLE LIBRE 2 SENSOR) MISC, APPLY EVERY 14 DAYS, Disp: 6 each, Rfl: 3   DULoxetine (CYMBALTA) 30 MG capsule, Take 1 capsule (30 mg total) by mouth daily. Take in addition to 60 mg cap for total of 90 mg daily, Disp: 90 capsule, Rfl: 1   DULoxetine (CYMBALTA) 60 MG capsule, TAKE 1 CAPSULE BY MOUTH EVERY DAY, Disp: 90 capsule, Rfl: 1   EPINEPHrine 0.3 mg/0.3 mL IJ SOAJ injection, , Disp: , Rfl:    Fexofenadine HCl (ALLEGRA ALLERGY PO), Take by mouth., Disp: , Rfl:    gabapentin (NEURONTIN) 100 MG capsule, Take 2 capsules (200 mg total) by mouth at bedtime., Disp: 180 capsule, Rfl: 1   glucose blood (ONETOUCH VERIO) test strip, Use 2x a day, Disp: 200 each, Rfl: 3   insulin NPH Human (NOVOLIN N)  100 UNIT/ML injection, Inject 20-40 Units into the skin. 36 units in am and 26 - 30 units at night time, Disp: , Rfl:    insulin regular (NOVOLIN R RELION) 100 units/mL injection, Inject 0.1-0.16 mLs (10-16 Units total) into the skin 3 (three) times daily before meals. (Patient taking differently: Inject 10-16 Units into the skin 3 (three) times daily before meals. Inject 10-16 units into the skin 3 times daily before meals), Disp: 10 mL, Rfl: 11   Insulin Syringe-Needle U-100 (B-D INS SYR ULTRAFINE .5CC/30G) 30G X 1/2" 0.5 ML MISC, Use to inject insulin once  daily., Disp: 100 each, Rfl: 11   levonorgestrel (MIRENA) 20 MCG/24HR IUD, 1 Intra Uterine Device (1 each total) by Intrauterine route once., Disp: 1 each, Rfl: 0   meloxicam (MOBIC) 15 MG tablet, Take 1 tablet (15 mg total) by mouth daily., Disp: 30 tablet, Rfl: 0   metFORMIN (GLUCOPHAGE) 1000 MG tablet, TAKE 2 TABLETS BY MOUTH DAILY WITH SUPPER. TAKE 1 TABLET BY MOUTH TWICE A DAY WITH A MEAL, Disp: 180 tablet, Rfl: 3   metoprolol succinate (TOPROL-XL) 25 MG 24 hr tablet, Take 1 tablet (25 mg total) by mouth daily., Disp: 90 tablet, Rfl: 1   omeprazole (PRILOSEC) 40 MG capsule, TAKE 1 CAPSULE (40 MG TOTAL) BY MOUTH DAILY BEFORE SUPPER., Disp: 90 capsule, Rfl: 1   ondansetron (ZOFRAN ODT) 4 MG disintegrating tablet, Take 1 tablet (4 mg total) by mouth every 8 (eight) hours as needed for nausea or vomiting., Disp: 20 tablet, Rfl: 0   promethazine (PHENERGAN) 25 MG tablet, Take 1 tablet (25 mg total) by mouth every 6 (six) hours as needed for nausea., Disp: 15 tablet, Rfl: 0   rizatriptan (MAXALT) 10 MG tablet, Take 1 tablet (10 mg total) by mouth as needed for migraine. May repeat in 2 hours if needed, Disp: 10 tablet, Rfl: 8   rosuvastatin (CRESTOR) 5 MG tablet, TAKE 1 TABLET (5 MG TOTAL) BY MOUTH 2 (TWO) TIMES A WEEK., Disp: 13 tablet, Rfl: 3   triamcinolone cream (KENALOG) 0.1 %, Apply 1 application topically 2 (two) times daily as needed (for itching)., Disp: 28.4 g, Rfl: 1   VITAMIN B1-B12 IM, Inject into the muscle., Disp: , Rfl:    cyanocobalamin (,VITAMIN B-12,) 1000 MCG/ML injection, Inject 1 mL (1,000 mcg total) into the muscle once for 1 dose., Disp: 3 mL, Rfl: 3   SYRINGE-NEEDLE, DISP, 3 ML 25G X 1" 3 ML MISC, Use for monthly B12 injections, Disp: 12 each, Rfl: 0   Objective:     Vitals:   05/10/21 0916  BP: 118/78  Pulse: (!) 102  SpO2: 97%  Weight: 234 lb (106.1 kg)  Height: 5\' 4"  (1.626 m)      Body mass index is 40.17 kg/m.    Physical Exam:    Gen: Appears well, nad,  nontoxic and pleasant Psych: Alert and oriented, appropriate mood and affect Neuro: sensation intact, strength is 5/5 with df/pf/inv/ev, muscle tone wnl Skin: no susupicious lesions or rashes  Right ankle: no deformity, no swelling or effusion TTP along posterior tibialis tendon, anterior tibialis tendon, distal Achilles tendon NTTP over fibular head, lat mal, medial mal, navicular, base of 5th, ATFL, CFL, deltoid, calcaneous or midfoot ROM DF 30, PF 45, inv/ev intact Negative ant drawer, talar tilt, rotation test, squeeze test. Neg thompson pain with resisted inversion, eversion, dorsiflexion, plantarflexion   Sports Medicine: Musculoskeletal Ultrasound.   Exam:US Complete right ankle/Lower Leg Exam.  Diagnosis: Right ankle pain  US Findings:  Medial Ankle: Mild edema surrounding posterior tibialis tendon.  Other tendons unremarkable Lateral Ankle: Unremarkable peroneal tendons Posterior Ankle: TTP at distal Achilles tendon, but unremarkable and ultrasound Anterior Ankle: TTP at anterior tibialis tendon superficial to anterior joint line, but ultrasound unremarkable Calf/lower leg: Unremarkable    US Impression:  1.  Posterior tibial tendinitis   Electronically signed by:  Benito Mccreedy D.[facility] 9:51 AM 05/10/21

## 2021-05-10 NOTE — Assessment & Plan Note (Addendum)
Chronic GERD controlled, but requires daily medication Continue omeprazole 40 mg daily

## 2021-05-10 NOTE — Assessment & Plan Note (Signed)
Chronic Had iron infusion about 6 months ago [facility], iron panel

## 2021-05-10 NOTE — Assessment & Plan Note (Signed)
Chronic Encouraged continuing to work on weight loss She is currently limited in her physical activity because of her ankle injury She is working on her diet

## 2021-05-10 NOTE — Assessment & Plan Note (Addendum)
Chronic Feeling much better since doing monthly B12 injections Has been coming here for monthly B12 injections and since her sister is a nurse she can start doing them at home on a monthly basis Will send prescription to pharmacy Check B12 level today

## 2021-05-10 NOTE — Assessment & Plan Note (Signed)
Chronic Controlled, stable Continue cymbalta 90 mg qd

## 2021-05-11 LAB — IRON,TIBC AND FERRITIN PANEL
%SAT: 10 % (calc) — ABNORMAL LOW (ref 16–45)
Ferritin: 27 ng/mL (ref 16–232)
Iron: 33 ug/dL — ABNORMAL LOW (ref 40–190)
TIBC: 315 mcg/dL (calc) (ref 250–450)

## 2021-05-22 ENCOUNTER — Encounter: Payer: Self-pay | Admitting: Internal Medicine

## 2021-05-23 ENCOUNTER — Ambulatory Visit: Payer: No Typology Code available for payment source

## 2021-05-23 MED ORDER — AMPHETAMINE-DEXTROAMPHET ER 20 MG PO CP24: 20.0000 mg | ORAL_CAPSULE | Freq: Every day | ORAL | 0 refills | Status: DC | PRN

## 2021-05-23 MED ORDER — ALPRAZOLAM 0.5 MG PO TABS: ORAL_TABLET | ORAL | 3 refills | Status: DC

## 2021-05-24 ENCOUNTER — Other Ambulatory Visit: Payer: Self-pay

## 2021-05-24 ENCOUNTER — Ambulatory Visit (INDEPENDENT_AMBULATORY_CARE_PROVIDER_SITE_OTHER): Payer: No Typology Code available for payment source | Admitting: Sports Medicine

## 2021-05-24 ENCOUNTER — Ambulatory Visit: Payer: Self-pay

## 2021-05-24 VITALS — BP 150/100 | HR 127 | Ht 64.0 in

## 2021-05-24 DIAGNOSIS — S86111D Strain of other muscle(s) and tendon(s) of posterior muscle group at lower leg level, right leg, subsequent encounter: Secondary | ICD-10-CM | POA: Diagnosis not present

## 2021-05-24 DIAGNOSIS — M79671 Pain in right foot: Secondary | ICD-10-CM | POA: Diagnosis not present

## 2021-05-24 NOTE — Patient Instructions (Addendum)
Good to see you  Injection given today  Referral to PT ordered  Take it easy the next 1-2 days Continue boot whenever weightbearing  See me again in 3 weeks

## 2021-05-24 NOTE — Progress Notes (Signed)
Lisa Duran D.[place] Phone: [phone]   Assessment and Plan:     1. Right foot pain 2. Posterior tibial strain, right, subsequent encounter -Acute on chronic, subsequent sports medicine visit - Only mild improvement in overall pain, but consistently having pain with weightbearing - Continue using cam walker boot at all times when weightbearing for additional 2 to 3 weeks - Start physical therapy for posterior tibial tendinopathy.  Referral sent - Continue meloxicam 15 mg as needed for pain control with Tylenol for breakthrough pain - Patient elected for CSI at today's visit.  Tolerated well per procedure note below - Ambulatory referral to Physical Therapy  Procedure: Ultrasound Guided Posterior Tibialis Sheath Injection  Side: right Diagnosis: Posterior tibial tendinopathy [place] Indication:  - accuracy is paramount for diagnosis - to ensure therapeutic efficacy or procedural success - to reduce procedural risk  After PARQ discussed and consent was given verbally. The site was cleaned with chlorhexidine prep. An ultrasound transducer was placed on the medial ankle and posterior tibialis was identified.  Care was taken to avoid tibial nerve and vascular structures in the tarsal tunnel.  A needle was introduced into the area adjacent to the posterior tib in the tarsal tunnel under ultrasound guidance.    A steroid injection was performed using 58ml of 1% lidocaine without epinephrine and 20 mg of triamcinolone (KENALOG) 40mg /ml. This was well tolerated and resulted in relief.  Needle was removed and dressing placed and post injection instructions were given including a discussion of likely return of pain today after the anesthetic wears off (with the possibility of worsened pain) until the steroid starts to work in 1-3 days.   Pt was advised to call or return to clinic if these symptoms worsen or fail to improve  as anticipated.    Pertinent previous records reviewed include none   Follow Up: 3 weeks for reevaluation.  At that time we will have undergone 6 weeks of conservative therapy, so if no improvement would order MRI at that time.   Subjective:   I, Lisa Duran, am serving as a scribe for Dr. Glennon Mac  Chief Complaint: right foot pain follow up   HPI:   05/03/21 Patient states a couple months ago had a decent R foot sprain but more recently stepped off a curb and had a shooting pain from mid arch right foot that shot up the ankle, then Thursday night her R foot arch to her bid toe was numb and tingling, the numbness since then has started to radiate to her heel. Patient limps and gait is off, patient having muscle spasms in right arch.    Radiates: yes  Swelling: no Aggravates: walking, cannot get on ball of foot, steps  Treatments tried: wearing appropriate shoe, rolling a cold bottle on arch, stretches for feet and calves,     05/10/21 Patient states that the pain has felt worse, More painful and more constant. Patient feeling like the brace is not helping with the pain when walking. Patient states that the pain at night is better and feels maybe that is the meloxicam working, and also has not had any muscle spasms. When patient walks the pain is sharper and more intense. Patient is noticing pain now on the top of the foot and in her calf.   05/24/21 Patient states that the overall pain is better but she cannot walk around without the boot at all. The top  on of the foot at the ankle is starting to hurt worse as well as her calf is starting to hurt. Patient still gets the sharp pain on the side of the foot. Patient has pain when she tried to put any weight on the foot. Has been doing the exercises.   Relevant Historical Information: DM type II, multiple sprains and injuries to right foot  Additional pertinent review of systems negative.   Current Outpatient Medications:     ALPRAZolam (XANAX) 0.5 MG tablet, TAKE 1 TABLET(0.5 MG) BY MOUTH THREE TIMES DAILY AS NEEDED FOR ANXIETY, Disp: 60 tablet, Rfl: 3   amphetamine-dextroamphetamine (ADDERALL XR) 20 MG 24 hr capsule, Take 1 capsule (20 mg total) by mouth daily as needed (school or work days)., Disp: 30 capsule, Rfl: 0   Continuous Blood Gluc Receiver (FREESTYLE LIBRE 2 READER) DEVI, USE AS DIRECTED, Disp: 1 each, Rfl: 0   Continuous Blood Gluc Sensor (FREESTYLE LIBRE 2 SENSOR) MISC, APPLY EVERY 14 DAYS, Disp: 6 each, Rfl: 3   DULoxetine (CYMBALTA) 30 MG capsule, Take 1 capsule (30 mg total) by mouth daily. Take in addition to 60 mg cap for total of 90 mg daily, Disp: 90 capsule, Rfl: 1   DULoxetine (CYMBALTA) 60 MG capsule, TAKE 1 CAPSULE BY MOUTH EVERY DAY, Disp: 90 capsule, Rfl: 1   EPINEPHrine 0.3 mg/0.3 mL IJ SOAJ injection, , Disp: , Rfl:    Fexofenadine HCl (ALLEGRA ALLERGY PO), Take by mouth., Disp: , Rfl:    gabapentin (NEURONTIN) 100 MG capsule, Take 2 capsules (200 mg total) by mouth at bedtime., Disp: 180 capsule, Rfl: 1   glucose blood (ONETOUCH VERIO) test strip, Use 2x a day, Disp: 200 each, Rfl: 3   insulin NPH Human (NOVOLIN N) 100 UNIT/ML injection, Inject 20-40 Units into the skin. 36 units in am and 26 - 30 units at night time, Disp: , Rfl:    insulin regular (NOVOLIN R RELION) 100 units/mL injection, Inject 0.1-0.16 mLs (10-16 Units total) into the skin 3 (three) times daily before meals. (Patient taking differently: Inject 10-16 Units into the skin 3 (three) times daily before meals. Inject 10-16 units into the skin 3 times daily before meals), Disp: 10 mL, Rfl: 11   Insulin Syringe-Needle U-100 (B-D INS SYR ULTRAFINE .5CC/30G) 30G X 1/2" 0.5 ML MISC, Use to inject insulin once daily., Disp: 100 each, Rfl: 11   levonorgestrel (MIRENA) 20 MCG/24HR IUD, 1 Intra Uterine Device (1 each total) by Intrauterine route once., Disp: 1 each, Rfl: 0   meloxicam (MOBIC) 15 MG tablet, Take 1 tablet (15 mg total) by  mouth daily., Disp: 30 tablet, Rfl: 0   metFORMIN (GLUCOPHAGE) 1000 MG tablet, TAKE 2 TABLETS BY MOUTH DAILY WITH SUPPER. TAKE 1 TABLET BY MOUTH TWICE A DAY WITH A MEAL, Disp: 180 tablet, Rfl: 3   metoprolol succinate (TOPROL-XL) 25 MG 24 hr tablet, Take 1 tablet (25 mg total) by mouth daily., Disp: 90 tablet, Rfl: 1   omeprazole (PRILOSEC) 40 MG capsule, TAKE 1 CAPSULE (40 MG TOTAL) BY MOUTH DAILY BEFORE SUPPER., Disp: 90 capsule, Rfl: 1   ondansetron (ZOFRAN ODT) 4 MG disintegrating tablet, Take 1 tablet (4 mg total) by mouth every 8 (eight) hours as needed for nausea or vomiting., Disp: 20 tablet, Rfl: 0   promethazine (PHENERGAN) 25 MG tablet, Take 1 tablet (25 mg total) by mouth every 6 (six) hours as needed for nausea., Disp: 15 tablet, Rfl: 0   rizatriptan (MAXALT) 10 MG tablet, Take  1 tablet (10 mg total) by mouth as needed for migraine. May repeat in 2 hours if needed, Disp: 10 tablet, Rfl: 8   rosuvastatin (CRESTOR) 5 MG tablet, TAKE 1 TABLET (5 MG TOTAL) BY MOUTH 2 (TWO) TIMES A WEEK., Disp: 13 tablet, Rfl: 3   SYRINGE-NEEDLE, DISP, 3 ML 25G X 1" 3 ML MISC, Use for monthly B12 injections, Disp: 12 each, Rfl: 0   triamcinolone cream (KENALOG) 0.1 %, Apply 1 application topically 2 (two) times daily as needed (for itching)., Disp: 28.4 g, Rfl: 1   VITAMIN B1-B12 IM, Inject into the muscle., Disp: , Rfl:    Objective:     Vitals:   05/24/21 1441  BP: (!) 150/100  Pulse: (!) 127  SpO2: 99%  Height: 5\' 4"  (1.626 m)      Body mass index is 40.17 kg/m.    Physical Exam:    Gen: Appears well, nad, nontoxic and pleasant Psych: Alert and oriented, appropriate mood and affect Neuro: sensation intact, strength is 5/5 with df/pf/inv/ev, muscle tone wnl Skin: no susupicious lesions or rashes   Right foot and ankle: no deformity, no swelling or effusion TTP base of fifth, navicular, along posterior tibial tendon, along Achilles tendon, along peroneal tendon NTTP over fibular head, lat  mal, medial mal, ATFL, CFL, deltoid, calcaneous or midfoot ROM DF 30, PF 45, inv/ev intact Negative ant drawer, talar tilt, rotation test, squeeze test. Neg thompson   Electronically signed by:  Lisa Duran D.[facility] 3:08 PM 05/24/21

## 2021-05-31 ENCOUNTER — Ambulatory Visit: Payer: No Typology Code available for payment source | Admitting: Sports Medicine

## 2021-06-05 ENCOUNTER — Ambulatory Visit: Payer: No Typology Code available for payment source | Attending: Sports Medicine | Admitting: Physical Therapy

## 2021-06-05 ENCOUNTER — Other Ambulatory Visit: Payer: Self-pay

## 2021-06-05 DIAGNOSIS — M25571 Pain in right ankle and joints of right foot: Secondary | ICD-10-CM | POA: Diagnosis present

## 2021-06-05 DIAGNOSIS — R2689 Other abnormalities of gait and mobility: Secondary | ICD-10-CM | POA: Diagnosis present

## 2021-06-05 DIAGNOSIS — S86111D Strain of other muscle(s) and tendon(s) of posterior muscle group at lower leg level, right leg, subsequent encounter: Secondary | ICD-10-CM | POA: Diagnosis not present

## 2021-06-05 DIAGNOSIS — M25671 Stiffness of right ankle, not elsewhere classified: Secondary | ICD-10-CM | POA: Diagnosis not present

## 2021-06-05 DIAGNOSIS — R252 Cramp and spasm: Secondary | ICD-10-CM | POA: Diagnosis present

## 2021-06-05 NOTE — Patient Instructions (Addendum)
Access Code: [email or web address] URL: [email or web address] Date: 06/05/2021 Prepared by: Almyra Free  Exercises Gastroc Stretch on Wall - 2 x daily - 7 x weekly - 1 sets - 3 reps - 30-60 sec hold Seated Great Toe Extension - 1 x daily - 7 x weekly - 3 sets - 10 reps Standing Heel Raise with Chair Support - 1 x daily - 7 x weekly - 1-3 sets - 10 reps - 3 sec up/hold/down hold Seated Heel Raise - 1 x daily - 7 x weekly - 1-3 sets - 10 reps Seated Ankle Eversion AROM (Mirrored) - 1 x daily - 7 x weekly - 2 sets - 10 reps - if feel stretch hold

## 2021-06-05 NOTE — Therapy (Signed)
[place] [place], [place], [address] Phone: [phone]   Fax:  [phone]  Physical Therapy Evaluation  Patient Details  Name: Lisa Duran MRN: [id number] Date of Birth: Jan 06, 1981 Referring Provider (PT): Glennon Mac, DO   Encounter Date: 06/05/2021   PT End of Session - 06/05/21 1156     Visit Number 1    Date for PT Re-Evaluation 07/17/21    Authorization Type [employer] Bind    PT Start Time 1156    PT Stop Time 4132    PT Time Calculation (min) 49 min    Activity Tolerance Patient tolerated treatment well    Behavior During Therapy [facility] for tasks assessed/performed             Past Medical History:  Diagnosis Date   ADHD (attention deficit hyperactivity disorder)    ALLERGIC RHINITIS    Allergy    Anemia    ANXIETY    Depression    DIABETES MELLITUS, TYPE II    Fibromyalgia 2018   GERD (gastroesophageal reflux disease)    MIGRAINE, COMMON    Obesity, unspecified    OVARIAN CYST     Past Surgical History:  Procedure Laterality Date   NO PAST SURGERIES      There were no vitals filed for this visit.    Subjective Assessment - 06/05/21 1200     Subjective In August, she sprained her right ankle stepping down step ladder. 04/29/21 stepping down a curb and had sharp shooting pain in right foot/ankle. Progressed to N/T. She was having a lot of muscles spasms in the bottom of the foot which she was icing/rolling prior to acute injury. On inclines she had pain in ant talo/crural joint on right. She stopped going barefoot at home. Now wearing really supportive footwear. She has been in a CAM boot at all times except bed and bathing. Since injection 05/24/21 ago it has felt much better. Going down stairs was the most painful.    Pertinent History anemia, DM, fibromyalgia, anx/dep, torn Rt calf muscle 2019    How long can you walk comfortably? cannot walk normally at this time    Diagnostic tests  [place] no tears    Patient Stated Goals get back to walking big dog and hiking; going up/down stairs    Currently in Pain? No/denies    Pain Score 4     Pain Location Foot    Pain Orientation Right;Medial    Pain Descriptors / Indicators Sharp;Numbness;Tingling    Pain Type Acute pain    Pain Radiating Towards into medial foot (along tendon)    Pain Onset More than a month ago    Pain Frequency Intermittent    Aggravating Factors  walking without the boot; stairs    Pain Relieving Factors rest                [facility] PT Assessment - 06/05/21 0001       Assessment   Medical Diagnosis right post tib strain    Referring Provider (PT) Glennon Mac, DO    Onset Date/Surgical Date 03/11/21    Hand Dominance Right    Next MD Visit 06/13/21      Precautions   Precautions None    Required Braces or Orthoses Other Brace/Splint    Other Brace/Splint CAM boot Rt      Restrictions   Weight Bearing Restrictions No      Balance Screen   Has  the patient fallen in the past 6 months No    Has the patient had a decrease in activity level because of a fear of falling?  No    Is the patient reluctant to leave their home because of a fear of falling?  No      Home Ecologist residence    Living Arrangements Spouse/significant other    Home Layout Two level    Alternate Level Stairs-Number of Steps 14    Alternate Level Stairs-Rails Right;Left      Prior Function   Level of Independence Independent    Vocation Full time employment    Administrator and town Games developer; up and down      ROM / Strength   AROM / PROM / Strength AROM;PROM;Strength      AROM   AROM Assessment Site Ankle    Right/Left Ankle Right    Right Ankle Dorsiflexion -2    Right Ankle Plantar Flexion 80    Right Ankle Inversion 23    Right Ankle Eversion 7      PROM   PROM Assessment Site Ankle    Right/Left Ankle Right    Right Ankle Dorsiflexion 10     Right Ankle Inversion 30    Right Ankle Eversion 15      Strength   Strength Assessment Site Ankle    Right/Left Ankle Right    Right Ankle Dorsiflexion 5/5    Right Ankle Inversion 5/5    Right Ankle Eversion 5/5      Palpation   Patella mobility mid/hindfoot and talocrural joints WNL mobilty    Palpation comment post tib along tibia, tender on lat malleolus and medial tendon b/w navicular and mallelolus; right dorsal interossei                        Objective measurements completed on examination: See above findings.                PT Education - 06/05/21 1301     Education Details HEP    Person(s) Educated Patient    Methods Explanation;Demonstration;Handout;Verbal cues    Comprehension Returned demonstration;Verbalized understanding                 PT Long Term Goals - 06/05/21 1302       PT LONG TERM GOAL #1   Title improved right ankle DF to 5-10 deg actively to normalize gait    Time 6    Period Weeks    Status New    Target Date 07/17/21      PT LONG TERM GOAL #2   Title Able to complete >= 10 Rt single leg heel raises without difficulty or pain increase    Time 6    Period Weeks    Status New      PT LONG TERM GOAL #3   Title able to ascend/descend stairs with a reciprocal gait pattern and no increase in right foot pain    Time 6    Period Weeks    Status New      PT LONG TERM GOAL #4   Title improved right SLS to >= 10 sec    Time 6    Period Weeks    Status New      PT LONG TERM GOAL #5   Title able to safely amb community distances on level and unlevel surfaces with a  normal gait pattern and without increased foot pain.    Time 6    Period Weeks    Status New      Additional Long Term Goals   Additional Long Term Goals Yes      PT LONG TERM GOAL #6   Title ind with HEP and its progression    Time 6    Period Weeks    Status New                    Plan - 06/05/21 1251     Clinical  Impression Statement Patient presents in a right CAM boot due to right post tibialis strain. She has been in the boot for about 4 weeks. Pain has decreased overall since the boot and injection 05/24/21. She has pain mainly with stairs, walking and heel raises now. She has tightness in her ankle limiting DF and Eversion and also has tightness with great toe extension.  Pt has a h/o of right calf tear 2-3 years ago which may be contibuting to her limits in DF. She has 5/5 ankle strength except in PF where she cannot get full ROM with SL heel raise. Her SLS balance is limited to 4 sec on the right which she attributes to being in the boot. She has marked tenderness of dorsal interossei and post tib along tibia. She has mod tenderness of PT tendon. She will benefit from PT to address these deficits and return her to her PLOF.    Personal Factors and Comorbidities Comorbidity 3+    Comorbidities anemia, DM, fibromyalgia, anx/dep, torn Rt calf muscle 2019    Examination-Activity Limitations Stairs;Locomotion Level    Stability/Clinical Decision Making Stable/Uncomplicated    Clinical Decision Making Low    Rehab Potential Excellent    PT Frequency 2x / week    PT Duration 6 weeks    PT Treatment/Interventions ADLs/Self Care Home Management;Cryotherapy;Electrical Stimulation;Iontophoresis 4mg /ml Dexamethasone;Moist Heat;Neuromuscular re-education;Balance training;Therapeutic exercise;Therapeutic activities;Stair training;Gait training;Patient/family education;Manual techniques;Dry needling    PT Next Visit Plan work on ankle ROM, PF strength, great toe extension, STM/DN to post tib and gastroc/soleus    PT Home Exercise Plan [phone]             Patient will benefit from skilled therapeutic intervention in order to improve the following deficits and impairments:  Difficulty walking, Decreased range of motion, Pain, Decreased balance, Decreased strength, Impaired flexibility, Increased muscle  spasms  Visit Diagnosis: Stiffness of right ankle, not elsewhere classified  Pain in right ankle and joints of right foot  Other abnormalities of gait and mobility  Cramp and spasm     Problem List Patient Active Problem List   Diagnosis Date Noted   B12 deficiency 05/09/2021   Dyslipidemia 04/14/2019   Enteritis 01/12/2019   Sleep walking disorder 04/06/2017   Iron deficiency anemia 03/27/2017   Fatigue 02/03/2017   Arthralgia 02/03/2017   Fibromyalgia 02/03/2017   GERD (gastroesophageal reflux disease) 06/24/2015   Eczema 06/22/2015   Depression 06/22/2015   Tachycardia 06/16/2014   Paresthesias 06/12/2014   Food allergy    ADHD (attention deficit hyperactivity disorder) 01/27/2011   Anxiety 02/20/2010   ALLERGIC RHINITIS 11/21/2009   OVARIAN CYST 11/21/2009   Diabetes mellitus type 2 with neurological manifestations ([place]) 11/20/2009   Class 2 obesity in adult 11/20/2009   Migraine without aura 11/20/2009    Madelyn Flavors, PT 06/05/2021, 1:09 PM  [address]  [place], [place], [address] Phone: [phone]   Fax:  [phone]  Name: Lisa Duran MRN: [id number] Date of Birth: Dec 31, 1980

## 2021-06-11 ENCOUNTER — Encounter: Payer: Self-pay | Admitting: Physical Therapy

## 2021-06-11 ENCOUNTER — Ambulatory Visit: Payer: No Typology Code available for payment source | Attending: Sports Medicine | Admitting: Physical Therapy

## 2021-06-11 ENCOUNTER — Other Ambulatory Visit: Payer: Self-pay

## 2021-06-11 DIAGNOSIS — R2689 Other abnormalities of gait and mobility: Secondary | ICD-10-CM | POA: Diagnosis present

## 2021-06-11 DIAGNOSIS — M25671 Stiffness of right ankle, not elsewhere classified: Secondary | ICD-10-CM | POA: Diagnosis present

## 2021-06-11 DIAGNOSIS — M25571 Pain in right ankle and joints of right foot: Secondary | ICD-10-CM

## 2021-06-11 DIAGNOSIS — R252 Cramp and spasm: Secondary | ICD-10-CM

## 2021-06-11 DIAGNOSIS — R600 Localized edema: Secondary | ICD-10-CM | POA: Diagnosis present

## 2021-06-11 NOTE — Therapy (Signed)
[address] [place], [place], [address] Phone: [phone]   Fax:  [phone]  Physical Therapy Treatment  Patient Details  Name: Lisa Duran MRN: [id number] Date of Birth: 1981-05-29 Referring Provider (PT): Glennon Mac, DO   Encounter Date: 06/11/2021   PT End of Session - 06/11/21 1419     Visit Number 2    Date for PT Re-Evaluation 07/17/21    PT Start Time 6629    PT Stop Time 1314    PT Time Calculation (min) 39 min    Activity Tolerance Patient tolerated treatment well    Behavior During Therapy [facility] for tasks assessed/performed             Past Medical History:  Diagnosis Date   ADHD (attention deficit hyperactivity disorder)    ALLERGIC RHINITIS    Allergy    Anemia    ANXIETY    Depression    DIABETES MELLITUS, TYPE II    Fibromyalgia 2018   GERD (gastroesophageal reflux disease)    MIGRAINE, COMMON    Obesity, unspecified    OVARIAN CYST     Past Surgical History:  Procedure Laterality Date   NO PAST SURGERIES      There were no vitals filed for this visit.   Subjective Assessment - 06/11/21 1254     Subjective She reports spasms in cal and foot last night, but overall feels the foot and ankle are more mobilie, with less pain.    Pertinent History anemia, DM, fibromyalgia, anx/dep, torn Rt calf muscle 2019    How long can you walk comfortably? cannot walk normally at this time    Diagnostic tests [place] no tears    Patient Stated Goals get back to walking big dog and hiking; going up/down stairs    Currently in Pain? No/denies                               [facility] Adult PT Treatment/Exercise - 06/11/21 0001       Exercises   Exercises Ankle      Manual Therapy   Manual Therapy Joint mobilization;Soft tissue mobilization;Myofascial release    Joint Mobilization talo crural, calcaneal inversion/eversion    Soft tissue mobilization Gastroc and soleus  deep tissue mobilization.    Myofascial Release plantar fascia      Ankle Exercises: Stretches   Plantar Fascia Stretch 2 reps;20 seconds    Soleus Stretch 3 reps;10 seconds    Gastroc Stretch 3 reps;10 seconds      Ankle Exercises: Seated   Towel Crunch 5 reps    Towel Crunch Limitations 2 sets    Towel Inversion/Eversion 5 reps    Towel Inversion/Eversion Limitations 2 sets    Toe Raise 10 reps    Other Seated Ankle Exercises PROM for toe flexion and extension 2 x 10 second holds.                     PT Education - 06/11/21 1315     Education Details Updated HEP    Person(s) Educated Patient    Methods Explanation;Demonstration;Handout    Comprehension Verbalized understanding;Returned demonstration              PT Short Term Goals - 06/11/21 1428       PT SHORT TERM GOAL #1   Title I with initial HEP    Baseline Exrcises  added    Time 4    Period Weeks    Status New    Target Date 07/09/21               PT Long Term Goals - 06/05/21 1302       PT LONG TERM GOAL #1   Title improved right ankle DF to 5-10 deg actively to normalize gait    Time 6    Period Weeks    Status New    Target Date 07/17/21      PT LONG TERM GOAL #2   Title Able to complete >= 10 Rt single leg heel raises without difficulty or pain increase    Time 6    Period Weeks    Status New      PT LONG TERM GOAL #3   Title able to ascend/descend stairs with a reciprocal gait pattern and no increase in right foot pain    Time 6    Period Weeks    Status New      PT LONG TERM GOAL #4   Title improved right SLS to >= 10 sec    Time 6    Period Weeks    Status New      PT LONG TERM GOAL #5   Title able to safely amb community distances on level and unlevel surfaces with a normal gait pattern and without increased foot pain.    Time 6    Period Weeks    Status New      Additional Long Term Goals   Additional Long Term Goals Yes      PT LONG TERM GOAL #6    Title ind with HEP and its progression    Time 6    Period Weeks    Status New                   Plan - 06/11/21 1420     Clinical Impression Statement Patient remains in CAM boot. Has Dr appointment Thursday after PT. Plan to continue with conservative treatment until patient hopefully cleared for increased WB and challenges. She demosntrated slightly improved great toe extension ROM. She reported calf spams last night, demosntrated tight calves with multiple knots. Therapist provided STM and joint mobs F/B stretch to foot and ankle and active mobilization.    Personal Factors and Comorbidities Comorbidity 3+    Comorbidities anemia, DM, fibromyalgia, anx/dep, torn Rt calf muscle 2019    Examination-Activity Limitations Stairs;Locomotion Level    Stability/Clinical Decision Making Stable/Uncomplicated    Clinical Decision Making Low    Rehab Potential Excellent    PT Frequency 2x / week    PT Duration 6 weeks    PT Treatment/Interventions ADLs/Self Care Home Management;Cryotherapy;Electrical Stimulation;Iontophoresis 4mg /ml Dexamethasone;Moist Heat;Neuromuscular re-education;Balance training;Therapeutic exercise;Therapeutic activities;Stair training;Gait training;Patient/family education;Manual techniques;Dry needling    PT Next Visit Plan work on ankle ROM, PF strength, great toe extension, STM/DN to post tib and gastroc/soleus    PT Home Exercise Plan [email or web address],  ARZFELMA    Consulted and Agree with Plan of Care Patient             Patient will benefit from skilled therapeutic intervention in order to improve the following deficits and impairments:  Difficulty walking, Decreased range of motion, Pain, Decreased balance, Decreased strength, Impaired flexibility, Increased muscle spasms  Visit Diagnosis: Stiffness of right ankle, not elsewhere classified  Pain in right ankle and joints of right foot  Other abnormalities of gait  and mobility  Cramp and  spasm     Problem List Patient Active Problem List   Diagnosis Date Noted   B12 deficiency 05/09/2021   Dyslipidemia 04/14/2019   Enteritis 01/12/2019   Sleep walking disorder 04/06/2017   Iron deficiency anemia 03/27/2017   Fatigue 02/03/2017   Arthralgia 02/03/2017   Fibromyalgia 02/03/2017   GERD (gastroesophageal reflux disease) 06/24/2015   Eczema 06/22/2015   Depression 06/22/2015   Tachycardia 06/16/2014   Paresthesias 06/12/2014   Food allergy    ADHD (attention deficit hyperactivity disorder) 01/27/2011   Anxiety 02/20/2010   ALLERGIC RHINITIS 11/21/2009   OVARIAN CYST 11/21/2009   Diabetes mellitus type 2 with neurological manifestations ([place]) 11/20/2009   Class 2 obesity in adult 11/20/2009   Migraine without aura 11/20/2009    Marcelina Morel, DPT 06/11/2021, 2:31 PM  [address] [place], [place], [address] Phone: [phone]   Fax:  [phone]  Name: Lisa Duran MRN: [id number] Date of Birth: [birth year]

## 2021-06-11 NOTE — Patient Instructions (Signed)
Access Code: [email or web address] URL: [email or web address] Date: 06/11/2021 Prepared by: Ethel Rana  Program Notes Toe stretches to be performed in sitting. Standing calf stretch performed with knee straight and knee bent.   Exercises Standing Toe Dorsiflexion Stretch - 1 x daily - 7 x weekly - 1 sets - 5 reps - 10 hold Standing Ankle Dorsiflexor Toe Extensor Stretch - 1 x daily - 7 x weekly - 1 sets - 5 reps - 10 hold Towel Scrunches - 1 x daily - 7 x weekly - 2 sets - 10 reps Gastroc Stretch on Wall - 1 x daily - 7 x weekly - 3 sets - 10 reps

## 2021-06-12 NOTE — Progress Notes (Deleted)
Benito Mccreedy D.[facility] [place] Phone: [phone]   Assessment and Plan:     There are no diagnoses linked to this encounter.  ***   Pertinent previous records reviewed include ***   Follow Up: ***     Subjective:    Chief Complaint: Right foot pain follow up   HPI:     05/03/21 Patient states a couple months ago had a decent R foot sprain but more recently stepped off a curb and had a shooting pain from mid arch right foot that shot up the ankle, then Thursday night her R foot arch to her bid toe was numb and tingling, the numbness since then has started to radiate to her heel. Patient limps and gait is off, patient having muscle spasms in right arch.    Radiates: yes  Swelling: no Aggravates: walking, cannot get on ball of foot, steps  Treatments tried: wearing appropriate shoe, rolling a cold bottle on arch, stretches for feet and calves,     05/10/21 Patient states that the pain has felt worse, More painful and more constant. Patient feeling like the brace is not helping with the pain when walking. Patient states that the pain at night is better and feels maybe that is the meloxicam working, and also has not had any muscle spasms. When patient walks the pain is sharper and more intense. Patient is noticing pain now on the top of the foot and in her calf.    05/24/21 Patient states that the overall pain is better but she cannot walk around without the boot at all. The top on of the foot at the ankle is starting to hurt worse as well as her calf is starting to hurt. Patient still gets the sharp pain on the side of the foot. Patient has pain when she tried to put any weight on the foot. Has been doing the exercises.   06/13/21 Patient states   Relevant Historical Information: ***  Additional pertinent review of systems negative.   Current Outpatient Medications:    ALPRAZolam (XANAX) 0.5 MG tablet,  TAKE 1 TABLET(0.5 MG) BY MOUTH THREE TIMES DAILY AS NEEDED FOR ANXIETY, Disp: 60 tablet, Rfl: 3   amphetamine-dextroamphetamine (ADDERALL XR) 20 MG 24 hr capsule, Take 1 capsule (20 mg total) by mouth daily as needed (school or work days)., Disp: 30 capsule, Rfl: 0   Continuous Blood Gluc Receiver (FREESTYLE LIBRE 2 READER) DEVI, USE AS DIRECTED, Disp: 1 each, Rfl: 0   Continuous Blood Gluc Sensor (FREESTYLE LIBRE 2 SENSOR) MISC, APPLY EVERY 14 DAYS, Disp: 6 each, Rfl: 3   DULoxetine (CYMBALTA) 30 MG capsule, Take 1 capsule (30 mg total) by mouth daily. Take in addition to 60 mg cap for total of 90 mg daily, Disp: 90 capsule, Rfl: 1   DULoxetine (CYMBALTA) 60 MG capsule, TAKE 1 CAPSULE BY MOUTH EVERY DAY, Disp: 90 capsule, Rfl: 1   EPINEPHrine 0.3 mg/0.3 mL IJ SOAJ injection, , Disp: , Rfl:    Fexofenadine HCl (ALLEGRA ALLERGY PO), Take by mouth., Disp: , Rfl:    gabapentin (NEURONTIN) 100 MG capsule, Take 2 capsules (200 mg total) by mouth at bedtime., Disp: 180 capsule, Rfl: 1   glucose blood (ONETOUCH VERIO) test strip, Use 2x a day, Disp: 200 each, Rfl: 3   insulin NPH Human (NOVOLIN N) 100 UNIT/ML injection, Inject 20-40 Units into the skin. 36 units in am and 26 - 30 units  at night time, Disp: , Rfl:    insulin regular (NOVOLIN R RELION) 100 units/mL injection, Inject 0.1-0.16 mLs (10-16 Units total) into the skin 3 (three) times daily before meals. (Patient taking differently: Inject 10-16 Units into the skin 3 (three) times daily before meals. Inject 10-16 units into the skin 3 times daily before meals), Disp: 10 mL, Rfl: 11   Insulin Syringe-Needle U-100 (B-D INS SYR ULTRAFINE .5CC/30G) 30G X 1/2" 0.5 ML MISC, Use to inject insulin once daily., Disp: 100 each, Rfl: 11   levonorgestrel (MIRENA) 20 MCG/24HR IUD, 1 Intra Uterine Device (1 each total) by Intrauterine route once., Disp: 1 each, Rfl: 0   meloxicam (MOBIC) 15 MG tablet, Take 1 tablet (15 mg total) by mouth daily., Disp: 30 tablet,  Rfl: 0   metFORMIN (GLUCOPHAGE) 1000 MG tablet, TAKE 2 TABLETS BY MOUTH DAILY WITH SUPPER. TAKE 1 TABLET BY MOUTH TWICE A DAY WITH A MEAL, Disp: 180 tablet, Rfl: 3   metoprolol succinate (TOPROL-XL) 25 MG 24 hr tablet, Take 1 tablet (25 mg total) by mouth daily., Disp: 90 tablet, Rfl: 1   omeprazole (PRILOSEC) 40 MG capsule, TAKE 1 CAPSULE (40 MG TOTAL) BY MOUTH DAILY BEFORE SUPPER., Disp: 90 capsule, Rfl: 1   ondansetron (ZOFRAN ODT) 4 MG disintegrating tablet, Take 1 tablet (4 mg total) by mouth every 8 (eight) hours as needed for nausea or vomiting., Disp: 20 tablet, Rfl: 0   promethazine (PHENERGAN) 25 MG tablet, Take 1 tablet (25 mg total) by mouth every 6 (six) hours as needed for nausea., Disp: 15 tablet, Rfl: 0   rizatriptan (MAXALT) 10 MG tablet, Take 1 tablet (10 mg total) by mouth as needed for migraine. May repeat in 2 hours if needed, Disp: 10 tablet, Rfl: 8   rosuvastatin (CRESTOR) 5 MG tablet, TAKE 1 TABLET (5 MG TOTAL) BY MOUTH 2 (TWO) TIMES A WEEK., Disp: 13 tablet, Rfl: 3   SYRINGE-NEEDLE, DISP, 3 ML 25G X 1" 3 ML MISC, Use for monthly B12 injections, Disp: 12 each, Rfl: 0   triamcinolone cream (KENALOG) 0.1 %, Apply 1 application topically 2 (two) times daily as needed (for itching)., Disp: 28.4 g, Rfl: 1   VITAMIN B1-B12 IM, Inject into the muscle., Disp: , Rfl:    Objective:     There were no vitals filed for this visit.    There is no height or weight on file to calculate BMI.    Physical Exam:    ***   Electronically signed by:  Benito Mccreedy D.[facility] 3:51 PM 06/12/21

## 2021-06-13 ENCOUNTER — Ambulatory Visit: Payer: No Typology Code available for payment source | Admitting: Sports Medicine

## 2021-06-13 ENCOUNTER — Ambulatory Visit: Payer: No Typology Code available for payment source | Admitting: Physical Therapy

## 2021-06-17 ENCOUNTER — Encounter: Payer: Self-pay | Admitting: Sports Medicine

## 2021-06-18 ENCOUNTER — Other Ambulatory Visit: Payer: Self-pay

## 2021-06-18 ENCOUNTER — Encounter: Payer: Self-pay | Admitting: Physical Therapy

## 2021-06-18 ENCOUNTER — Ambulatory Visit: Payer: No Typology Code available for payment source | Admitting: Physical Therapy

## 2021-06-18 DIAGNOSIS — M25571 Pain in right ankle and joints of right foot: Secondary | ICD-10-CM

## 2021-06-18 DIAGNOSIS — R252 Cramp and spasm: Secondary | ICD-10-CM

## 2021-06-18 DIAGNOSIS — R2689 Other abnormalities of gait and mobility: Secondary | ICD-10-CM

## 2021-06-18 DIAGNOSIS — M25671 Stiffness of right ankle, not elsewhere classified: Secondary | ICD-10-CM | POA: Diagnosis not present

## 2021-06-18 NOTE — Therapy (Signed)
[place]. [place], [place], [address] Phone: [phone]   Fax:  [phone]  Physical Therapy Treatment  Patient Details  Name: Lisa Duran MRN: [id number] Date of Birth: 1980-09-30 Referring Provider (PT): Glennon Mac, DO   Encounter Date: 06/18/2021   PT End of Session - 06/18/21 1400     Visit Number 3    Number of Visits 12    Date for PT Re-Evaluation 07/17/21    PT Start Time 1316    PT Stop Time 1400    PT Time Calculation (min) 44 min    Activity Tolerance Patient tolerated treatment well    Behavior During Therapy [facility] for tasks assessed/performed             Past Medical History:  Diagnosis Date   ADHD (attention deficit hyperactivity disorder)    ALLERGIC RHINITIS    Allergy    Anemia    ANXIETY    Depression    DIABETES MELLITUS, TYPE II    Fibromyalgia 2018   GERD (gastroesophageal reflux disease)    MIGRAINE, COMMON    Obesity, unspecified    OVARIAN CYST     Past Surgical History:  Procedure Laterality Date   NO PAST SURGERIES      There were no vitals filed for this visit.   Subjective Assessment - 06/18/21 1321     Subjective CAM boot D/C'd. Patient arrives without the boot, reports increased pain today after walking her dog in the neighborhood. She was not having difficulty walking in her home or office, but feels the uneven surface and increased distance may have been too much.    Pertinent History anemia, DM, fibromyalgia, anx/dep, torn Rt calf muscle 2019    How long can you walk comfortably? cannot walk normally at this time    Diagnostic tests [place] no tears    Patient Stated Goals get back to walking big dog and hiking; going up/down stairs    Currently in Pain? No/denies    Pain Onset More than a month ago                [facility] PT Assessment - 06/18/21 0001       PROM   Right Ankle Dorsiflexion 15                           [facility]  Adult PT Treatment/Exercise - 06/18/21 0001       Manual Therapy   Manual Therapy Joint mobilization;Soft tissue mobilization;Myofascial release    Joint Mobilization talo crural, calcaneal inversion/eversion    Soft tissue mobilization Gastroc and soleus deep tissue mobilization followed by stretch into DF with knee ext and knee flex.    Myofascial Release plantar fascia      Ankle Exercises: Aerobic   Nustep L4 x 5      Ankle Exercises: Standing   Heel Walk (Round Trip) 2 x 20'    Toe Walk (Round Trip) 2 x 20'    Other Standing Ankle Exercises Standing foot and toe flexion, 2 x 10 with 5 sec hold.      Ankle Exercises: Seated   Other Seated Ankle Exercises PROM for toe flexion and extension 2 x 10 second holds.    Other Seated Ankle Exercises Resisted Inversion and Eversion against yellow T band 2 x 10 reps.  PT Education - 06/18/21 1359     Education Details HEP reviewed and maintained at current exercises.    Person(s) Educated Patient    Methods Explanation;Demonstration    Comprehension Verbalized understanding;Returned demonstration              PT Short Term Goals - 06/18/21 1404       PT SHORT TERM GOAL #1   Title I with initial HEP    Baseline Exrcises added    Time 4    Period Weeks    Status Achieved    Target Date 07/09/21               PT Long Term Goals - 06/05/21 1302       PT LONG TERM GOAL #1   Title improved right ankle DF to 5-10 deg actively to normalize gait    Time 6    Period Weeks    Status New    Target Date 07/17/21      PT LONG TERM GOAL #2   Title Able to complete >= 10 Rt single leg heel raises without difficulty or pain increase    Time 6    Period Weeks    Status New      PT LONG TERM GOAL #3   Title able to ascend/descend stairs with a reciprocal gait pattern and no increase in right foot pain    Time 6    Period Weeks    Status New      PT LONG TERM GOAL #4   Title improved  right SLS to >= 10 sec    Time 6    Period Weeks    Status New      PT LONG TERM GOAL #5   Title able to safely amb community distances on level and unlevel surfaces with a normal gait pattern and without increased foot pain.    Time 6    Period Weeks    Status New      Additional Long Term Goals   Additional Long Term Goals Yes      PT LONG TERM GOAL #6   Title ind with HEP and its progression    Time 6    Period Weeks    Status New                   Plan - 06/18/21 1401     Clinical Impression Statement CAM boot D/C'd. Patient has been walking in home and office without boot, no issues. However, she walked her dog in the neighborhood without boot and noticed increased pain afterwards. She tolerated deep tissue mobilization and stretch well, demonstrates increased DF to 0-15 degrees. increaesd resistance iwth strengthening exercises as well. Progressing toward goals.    Personal Factors and Comorbidities Comorbidity 3+    Comorbidities anemia, DM, fibromyalgia, anx/dep, torn Rt calf muscle 2019    Examination-Activity Limitations Stairs;Locomotion Level    Stability/Clinical Decision Making Stable/Uncomplicated    Rehab Potential Excellent    PT Frequency 2x / week    PT Duration Other (comment)   5w   PT Treatment/Interventions ADLs/Self Care Home Management;Cryotherapy;Electrical Stimulation;Iontophoresis 4mg /ml Dexamethasone;Moist Heat;Neuromuscular re-education;Balance training;Therapeutic exercise;Therapeutic activities;Stair training;Gait training;Patient/family education;Manual techniques;Dry needling    PT Next Visit Plan Progress soft tissue mobility, strength in foot and ankle, and ROM. Possibly SLS activities.    PT Home Exercise Plan [id number],  ARZFELMA    Consulted and Agree with Plan of Care Patient  Patient will benefit from skilled therapeutic intervention in order to improve the following deficits and impairments:  Difficulty walking,  Decreased range of motion, Pain, Decreased balance, Decreased strength, Impaired flexibility, Increased muscle spasms  Visit Diagnosis: Stiffness of right ankle, not elsewhere classified  Pain in right ankle and joints of right foot  Other abnormalities of gait and mobility  Cramp and spasm     Problem List Patient Active Problem List   Diagnosis Date Noted   B12 deficiency 05/09/2021   Dyslipidemia 04/14/2019   Enteritis 01/12/2019   Sleep walking disorder 04/06/2017   Iron deficiency anemia 03/27/2017   Fatigue 02/03/2017   Arthralgia 02/03/2017   Fibromyalgia 02/03/2017   GERD (gastroesophageal reflux disease) 06/24/2015   Eczema 06/22/2015   Depression 06/22/2015   Tachycardia 06/16/2014   Paresthesias 06/12/2014   Food allergy    ADHD (attention deficit hyperactivity disorder) 01/27/2011   Anxiety 02/20/2010   ALLERGIC RHINITIS 11/21/2009   OVARIAN CYST 11/21/2009   Diabetes mellitus type 2 with neurological manifestations ([place]) 11/20/2009   Class 2 obesity in adult 11/20/2009   Migraine without aura 11/20/2009    Marcelina Morel, DPT 06/18/2021, 2:05 PM  [place]. [place], [place], [address] Phone: [phone]   Fax:  [phone]  Name: Lisa Duran MRN: [id number] Date of Birth: [birth year]

## 2021-06-19 ENCOUNTER — Encounter: Payer: Self-pay | Admitting: Internal Medicine

## 2021-06-19 DIAGNOSIS — E1149 Type 2 diabetes mellitus with other diabetic neurological complication: Secondary | ICD-10-CM

## 2021-06-20 ENCOUNTER — Encounter: Payer: Self-pay | Admitting: Physical Therapy

## 2021-06-20 ENCOUNTER — Ambulatory Visit: Payer: No Typology Code available for payment source | Admitting: Physical Therapy

## 2021-06-20 ENCOUNTER — Other Ambulatory Visit: Payer: Self-pay

## 2021-06-20 DIAGNOSIS — R2689 Other abnormalities of gait and mobility: Secondary | ICD-10-CM

## 2021-06-20 DIAGNOSIS — M25571 Pain in right ankle and joints of right foot: Secondary | ICD-10-CM

## 2021-06-20 DIAGNOSIS — M25671 Stiffness of right ankle, not elsewhere classified: Secondary | ICD-10-CM | POA: Diagnosis not present

## 2021-06-20 DIAGNOSIS — R252 Cramp and spasm: Secondary | ICD-10-CM

## 2021-06-20 MED ORDER — FREESTYLE LIBRE 2 SENSOR MISC: 5 refills | Status: DC

## 2021-06-20 NOTE — Patient Instructions (Signed)
Access Code: [place] URL: [email or web address] Date: 06/20/2021 Prepared by: Ethel Rana  Exercises Seated Ankle Eversion with Resistance - 1 x daily - 7 x weekly - 2 sets - 10 reps Seated Figure 4 Ankle Inversion with Resistance - 1 x daily - 7 x weekly - 2 sets - 10 reps Long Sitting Ankle Inversion with Resistance - 1 x daily - 7 x weekly - 2 sets - 10 reps Seated Ankle Inversion with Anchored Resistance - 1 x daily - 7 x weekly - 2 sets - 10 reps

## 2021-06-20 NOTE — Therapy (Signed)
[place]. [place], [place], [address] Phone: [phone]   Fax:  [phone]  Physical Therapy Treatment  Patient Details  Name: Lisa Duran MRN: [id number] Date of Birth: August 30, 1980 Referring Provider (PT): Glennon Mac, DO   Encounter Date: 06/20/2021   PT End of Session - 06/20/21 0840     Visit Number 4    Number of Visits 12    Date for PT Re-Evaluation 07/17/21    PT Start Time 0800    PT Stop Time 0840    PT Time Calculation (min) 40 min    Activity Tolerance Patient tolerated treatment well;Patient limited by pain    Behavior During Therapy [facility] for tasks assessed/performed             Past Medical History:  Diagnosis Date   ADHD (attention deficit hyperactivity disorder)    ALLERGIC RHINITIS    Allergy    Anemia    ANXIETY    Depression    DIABETES MELLITUS, TYPE II    Fibromyalgia 2018   GERD (gastroesophageal reflux disease)    MIGRAINE, COMMON    Obesity, unspecified    OVARIAN CYST     Past Surgical History:  Procedure Laterality Date   NO PAST SURGERIES      There were no vitals filed for this visit.   Subjective Assessment - 06/20/21 0804     Subjective Patient reports increased pain after having to walk more than usual at work yesterday.    Pertinent History anemia, DM, fibromyalgia, anx/dep, torn Rt calf muscle 2019    How long can you walk comfortably? cannot walk normally at this time    Diagnostic tests [place] no tears    Patient Stated Goals get back to walking big dog and hiking; going up/down stairs    Currently in Pain? Yes    Pain Score 3     Pain Location Foot    Pain Orientation Right                               [facility] Adult PT Treatment/Exercise - 06/20/21 0001       Manual Therapy   Manual Therapy Soft tissue mobilization;Myofascial release;Passive ROM    Soft tissue mobilization Gastroc and soleus deep tissue mobilization  followed by stretch into DF with knee ext and knee flex.    Myofascial Release plantar fascia      Ankle Exercises: Aerobic   Nustep L4 x 6      Ankle Exercises: Seated   Towel Inversion/Eversion Limitations Introduced Red theraband resistance, 2 x 10 reps each                     PT Education - 06/20/21 0839     Education Details HEP updated iwth resisted Inv/Ever. Educated to progressive increase in strengthening and ROM activities. Recommended orthotic inserts with referal to [employer].    Person(s) Educated Patient    Methods Explanation;Demonstration;Handout    Comprehension Returned demonstration;Verbalized understanding              PT Short Term Goals - 06/18/21 1404       PT SHORT TERM GOAL #1   Title I with initial HEP    Baseline Exrcises added    Time 4    Period Weeks    Status Achieved    Target Date 07/09/21  PT Long Term Goals - 06/05/21 1302       PT LONG TERM GOAL #1   Title improved right ankle DF to 5-10 deg actively to normalize gait    Time 6    Period Weeks    Status New    Target Date 07/17/21      PT LONG TERM GOAL #2   Title Able to complete >= 10 Rt single leg heel raises without difficulty or pain increase    Time 6    Period Weeks    Status New      PT LONG TERM GOAL #3   Title able to ascend/descend stairs with a reciprocal gait pattern and no increase in right foot pain    Time 6    Period Weeks    Status New      PT LONG TERM GOAL #4   Title improved right SLS to >= 10 sec    Time 6    Period Weeks    Status New      PT LONG TERM GOAL #5   Title able to safely amb community distances on level and unlevel surfaces with a normal gait pattern and without increased foot pain.    Time 6    Period Weeks    Status New      Additional Long Term Goals   Additional Long Term Goals Yes      PT LONG TERM GOAL #6   Title ind with HEP and its progression    Time 6    Period Weeks     Status New                   Plan - 06/20/21 0841     Clinical Impression Statement Patient with increased soreness today after more walking than expected at work yesterday. Focused treatment on calf-trigger points, STM, stretch to gastroc and soleus. Updated HEP for inv/ever. Plan to decrease treatments to 1 x/week as she is progressing well.    Personal Factors and Comorbidities Comorbidity 3+    Comorbidities anemia, DM, fibromyalgia, anx/dep, torn Rt calf muscle 2019    Examination-Activity Limitations Stairs;Locomotion Level    Stability/Clinical Decision Making Stable/Uncomplicated    Clinical Decision Making Low    Rehab Potential Excellent    PT Frequency 1x / week    PT Duration 4 weeks   5w   PT Treatment/Interventions ADLs/Self Care Home Management;Cryotherapy;Electrical Stimulation;Iontophoresis 4mg /ml Dexamethasone;Moist Heat;Neuromuscular re-education;Balance training;Therapeutic exercise;Therapeutic activities;Stair training;Gait training;Patient/family education;Manual techniques;Dry needling    PT Next Visit Plan Progress soft tissue mobility, strength in foot and ankle, and ROM. Possibly SLS activities.    PT Home Exercise Plan [id number],  ARZFELMA,  [place] and Agree with Plan of Care Patient             Patient will benefit from skilled therapeutic intervention in order to improve the following deficits and impairments:  Difficulty walking, Decreased range of motion, Pain, Decreased balance, Decreased strength, Impaired flexibility, Increased muscle spasms  Visit Diagnosis: Stiffness of right ankle, not elsewhere classified  Pain in right ankle and joints of right foot  Other abnormalities of gait and mobility  Cramp and spasm     Problem List Patient Active Problem List   Diagnosis Date Noted   B12 deficiency 05/09/2021   Dyslipidemia 04/14/2019   Enteritis 01/12/2019   Sleep walking disorder 04/06/2017   Iron deficiency anemia  03/27/2017   Fatigue 02/03/2017   Arthralgia  02/03/2017   Fibromyalgia 02/03/2017   GERD (gastroesophageal reflux disease) 06/24/2015   Eczema 06/22/2015   Depression 06/22/2015   Tachycardia 06/16/2014   Paresthesias 06/12/2014   Food allergy    ADHD (attention deficit hyperactivity disorder) 01/27/2011   Anxiety 02/20/2010   ALLERGIC RHINITIS 11/21/2009   OVARIAN CYST 11/21/2009   Diabetes mellitus type 2 with neurological manifestations ([place]) 11/20/2009   Class 2 obesity in adult 11/20/2009   Migraine without aura 11/20/2009    Marcelina Morel, DPT 06/20/2021, 8:44 AM  [place]. [place], [place], [address] Phone: [phone]   Fax:  [phone]  Name: Lisa Duran MRN: [id number] Date of Birth: [birth year]

## 2021-06-25 ENCOUNTER — Ambulatory Visit: Payer: No Typology Code available for payment source | Admitting: Physical Therapy

## 2021-06-25 MED ORDER — METFORMIN HCL 1000 MG PO TABS: ORAL_TABLET | ORAL | 5 refills | Status: DC

## 2021-06-25 NOTE — Addendum Note (Signed)
Addended by: Lauralyn Primes on: 06/25/2021 12:46 PM   Modules accepted: Orders

## 2021-06-27 ENCOUNTER — Ambulatory Visit: Payer: No Typology Code available for payment source | Admitting: Physical Therapy

## 2021-06-27 ENCOUNTER — Other Ambulatory Visit: Payer: Self-pay

## 2021-06-27 ENCOUNTER — Encounter: Payer: Self-pay | Admitting: Physical Therapy

## 2021-06-27 DIAGNOSIS — M25671 Stiffness of right ankle, not elsewhere classified: Secondary | ICD-10-CM | POA: Diagnosis not present

## 2021-06-27 DIAGNOSIS — R252 Cramp and spasm: Secondary | ICD-10-CM

## 2021-06-27 DIAGNOSIS — M25571 Pain in right ankle and joints of right foot: Secondary | ICD-10-CM

## 2021-06-27 DIAGNOSIS — R600 Localized edema: Secondary | ICD-10-CM

## 2021-06-27 DIAGNOSIS — R2689 Other abnormalities of gait and mobility: Secondary | ICD-10-CM

## 2021-06-27 NOTE — Therapy (Signed)
[address] [place], [place], [address] Phone: [phone]   Fax:  [phone]  Physical Therapy Treatment  Patient Details  Name: Lisa Duran MRN: [id number] Date of Birth: 1980-10-22 Referring Provider (PT): Glennon Mac, DO   Encounter Date: 06/27/2021   PT End of Session - 06/27/21 0841     Visit Number 5    Number of Visits 12    Date for PT Re-Evaluation 07/17/21    PT Start Time 0805    PT Stop Time 0845    PT Time Calculation (min) 40 min    Activity Tolerance Patient limited by pain;Patient tolerated treatment well    Behavior During Therapy [facility] for tasks assessed/performed             Past Medical History:  Diagnosis Date   ADHD (attention deficit hyperactivity disorder)    ALLERGIC RHINITIS    Allergy    Anemia    ANXIETY    Depression    DIABETES MELLITUS, TYPE II    Fibromyalgia 2018   GERD (gastroesophageal reflux disease)    MIGRAINE, COMMON    Obesity, unspecified    OVARIAN CYST     Past Surgical History:  Procedure Laterality Date   NO PAST SURGERIES      There were no vitals filed for this visit.   Subjective Assessment - 06/27/21 0808     Subjective Patient reports increased pain and spasms in foot, she is somewhat discouraged. She did obtain arch support insoles and they appear to be helping.    Pertinent History anemia, DM, fibromyalgia, anx/dep, torn Rt calf muscle 2019    How long can you walk comfortably? cannot walk normally at this time    Diagnostic tests [place] no tears    Patient Stated Goals get back to walking big dog and hiking; going up/down stairs    Currently in Pain? Yes    Pain Score 4     Pain Location Foot    Pain Orientation Right    Pain Descriptors / Indicators Sharp    Pain Type Acute pain    Pain Radiating Towards in foot, with calf spasms    Pain Onset More than a month ago    Pain Frequency Constant                                [facility] Adult PT Treatment/Exercise - 06/27/21 0001       Ambulation/Gait   Gait Comments Patient observed with antalgic gait pattern, decreased W through RLE.      Modalities   Modalities Vasopneumatic      Vasopneumatic   Number Minutes Vasopneumatic  15 minutes    Vasopnuematic Location  Ankle    Vasopneumatic Pressure Low    Vasopneumatic Temperature  34      Manual Therapy   Manual Therapy Soft tissue mobilization;Passive ROM    Manual therapy comments Patient reports pain and spasm in foot. Great toe flexor sharply TTP when put on stretch.    Soft tissue mobilization Cross friction massage to post tibialis muscle and tendon    Passive ROM PROM after STM for DF, Inversion/eversion      Ankle Exercises: Seated   Ankle Circles/Pumps AROM;Right;10 reps    Other Seated Ankle Exercises AROM for inversion/Eversion  PT Education - 06/27/21 0839     Education Details Educated to aching after cross friction massage. Educated to back off on passive stretch of toe flexors and extensors, move to AROM.    Person(s) Educated Patient    Methods Explanation    Comprehension Verbalized understanding              PT Short Term Goals - 06/18/21 1404       PT SHORT TERM GOAL #1   Title I with initial HEP    Baseline Exrcises added    Time 4    Period Weeks    Status Achieved    Target Date 07/09/21               PT Long Term Goals - 06/27/21 0931       PT LONG TERM GOAL #1   Title improved right ankle DF to 5-10 deg actively to normalize gait    Time 3    Period Weeks    Status On-going      PT LONG TERM GOAL #2   Title Able to complete >= 10 Rt single leg heel raises without difficulty or pain increase    Baseline Able to complete B heel raises, but not SLS    Time 3    Period Weeks    Status On-going      PT LONG TERM GOAL #3   Title able to ascend/descend stairs with a reciprocal gait  pattern and no increase in right foot pain    Time 3    Period Weeks    Status On-going      PT LONG TERM GOAL #4   Title improved right SLS to >= 10 sec    Time 3    Period Weeks    Status On-going      PT LONG TERM GOAL #5   Title able to safely amb community distances on level and unlevel surfaces with a normal gait pattern and without increased foot pain.    Time 3    Period Weeks    Status On-going      PT LONG TERM GOAL #6   Title ind with HEP and its progression    Time 3    Period Weeks    Status On-going                   Plan - 06/27/21 0841     Clinical Impression Statement Patient arrived discouraged due to increased pain and spasm in R foot. She reports that her arch feels better with insoles and treatment, but she is once again favoring the R foot, observed during gait. Therapist performed cross friction massage to tib posterior muscle followed by stretch in ankle DF inv/ever, and AROM for same motions. Intrinsic great toe flexor tendon very TTP, patient educated to back off on PROM and just perform AROM for toe flex/ext. Finished with vasopneumatic treatment.    Personal Factors and Comorbidities Comorbidity 3+    Comorbidities anemia, DM, fibromyalgia, anx/dep, torn Rt calf muscle 2019    Examination-Activity Limitations Stairs;Locomotion Level    Stability/Clinical Decision Making Stable/Uncomplicated    Clinical Decision Making Low    Rehab Potential Excellent    PT Frequency 1x / week    PT Duration 3 weeks   5w   PT Treatment/Interventions ADLs/Self Care Home Management;Cryotherapy;Electrical Stimulation;Iontophoresis 4mg /ml Dexamethasone;Moist Heat;Neuromuscular re-education;Balance training;Therapeutic exercise;Therapeutic activities;Stair training;Gait training;Patient/family education;Manual techniques;Dry needling    PT Next Visit Plan  Assess response to today's treatment and progress iwth STM as tolerated. Continue strenghtening. Possibly SLS  activities.    PT Home Exercise Plan [id number],  Gervais,  Amado and Agree with Plan of Care Patient             Patient will benefit from skilled therapeutic intervention in order to improve the following deficits and impairments:  Difficulty walking, Decreased range of motion, Pain, Decreased balance, Decreased strength, Impaired flexibility, Increased muscle spasms  Visit Diagnosis: Stiffness of right ankle, not elsewhere classified  Localized edema  Pain in right ankle and joints of right foot  Other abnormalities of gait and mobility  Cramp and spasm     Problem List Patient Active Problem List   Diagnosis Date Noted   B12 deficiency 05/09/2021   Dyslipidemia 04/14/2019   Enteritis 01/12/2019   Sleep walking disorder 04/06/2017   Iron deficiency anemia 03/27/2017   Fatigue 02/03/2017   Arthralgia 02/03/2017   Fibromyalgia 02/03/2017   GERD (gastroesophageal reflux disease) 06/24/2015   Eczema 06/22/2015   Depression 06/22/2015   Tachycardia 06/16/2014   Paresthesias 06/12/2014   Food allergy    ADHD (attention deficit hyperactivity disorder) 01/27/2011   Anxiety 02/20/2010   ALLERGIC RHINITIS 11/21/2009   OVARIAN CYST 11/21/2009   Diabetes mellitus type 2 with neurological manifestations ([place]) 11/20/2009   Class 2 obesity in adult 11/20/2009   Migraine without aura 11/20/2009    Marcelina Morel, DPT 06/27/2021, 9:34 AM  [address] [place], [place], [address] Phone: [phone]   Fax:  [phone]  Name: Shalia Bartko MRN: [id number] Date of Birth: [birth year]

## 2021-06-28 ENCOUNTER — Other Ambulatory Visit: Payer: Self-pay | Admitting: Internal Medicine

## 2021-06-28 MED ORDER — AMPHETAMINE-DEXTROAMPHET ER 20 MG PO CP24: 20.0000 mg | ORAL_CAPSULE | Freq: Every day | ORAL | 0 refills | Status: DC | PRN

## 2021-07-01 ENCOUNTER — Other Ambulatory Visit: Payer: Self-pay

## 2021-07-01 ENCOUNTER — Ambulatory Visit (INDEPENDENT_AMBULATORY_CARE_PROVIDER_SITE_OTHER): Payer: No Typology Code available for payment source | Admitting: Internal Medicine

## 2021-07-01 ENCOUNTER — Encounter: Payer: Self-pay | Admitting: Internal Medicine

## 2021-07-01 VITALS — BP 140/90 | HR 108 | Ht 64.0 in | Wt 231.8 lb

## 2021-07-01 DIAGNOSIS — Z6839 Body mass index (BMI) 39.0-39.9, adult: Secondary | ICD-10-CM | POA: Diagnosis not present

## 2021-07-01 DIAGNOSIS — E1149 Type 2 diabetes mellitus with other diabetic neurological complication: Secondary | ICD-10-CM | POA: Diagnosis not present

## 2021-07-01 DIAGNOSIS — E785 Hyperlipidemia, unspecified: Secondary | ICD-10-CM | POA: Diagnosis not present

## 2021-07-01 LAB — POCT GLYCOSYLATED HEMOGLOBIN (HGB A1C): Hemoglobin A1C: 7.6 % — AB (ref 4.0–5.6)

## 2021-07-01 MED ORDER — HUMULIN N KWIKPEN 100 UNIT/ML ~~LOC~~ SUPN: 18.0000 [IU] | PEN_INJECTOR | Freq: Two times a day (BID) | SUBCUTANEOUS | 3 refills | Status: DC

## 2021-07-01 MED ORDER — INSULIN REGULAR HUMAN 100 UNIT/ML IJ SOLN: 8.0000 [IU] | Freq: Three times a day (TID) | INTRAMUSCULAR | 3 refills | Status: DC

## 2021-07-01 MED ORDER — OZEMPIC (0.25 OR 0.5 MG/DOSE) 2 MG/1.5ML ~~LOC~~ SOPN: 0.5000 mg | PEN_INJECTOR | SUBCUTANEOUS | 3 refills | Status: DC

## 2021-07-01 NOTE — Progress Notes (Signed)
=ubjective:     Patient ID: Lisa Duran, female   DOB: 1980/10/11, 40 y.o.   MRN: [id number]  This visit occurred during the SARS-CoV-2 public health emergency.  Safety protocols were in place, including screening questions prior to the visit, additional usage of staff PPE, and extensive cleaning of exam room while observing appropriate contact time as indicated for disinfecting solutions.   HPI [name] is a pleasant 40 y.o.  woman returning for f/u for DM2, insulin dependent, uncontrolled, dx 25/4270, w/o complications. Last visit 4 months ago.  Interim history: No increased urination, blurry vision, nausea, chest pain.  She feels she gained weight since last visit -per our scale, she lost 7 pounds. She has a little eczema on the right foot, but otherwise no rashes right now. Before last visit he started to feel much better after she started back on iv iron and also started im B12.  Her sugars also improved.  Reviewed HbA1c levels: Lab Results  Component Value Date   HGBA1C 7.5 (A) 02/21/2021   HGBA1C 7.6 (A) 10/16/2020   HGBA1C 8.6 (A) 06/12/2020   HGBA1C 8.5 (A) 11/07/2019   HGBA1C 8.4 (A) 08/09/2019   HGBA1C 9.1 (A) 04/14/2019   HGBA1C 9.0 (H) 10/20/2018   HGBA1C 7.2 (A) 05/27/2018   HGBA1C 9.4 (A) 01/22/2018   HGBA1C 6.2 05/25/2017   She is on:   - Metformin 1000 mg 2x a day  - NPH 30 units 2x a day >> 40 units in a.m. and 22-24 units at bedtime >> 34 units and 26-30 units at bedtime >> 26-28 units and 22-24 units at bedtime - Regular  15-17 units 3 times a day before meals >> 10-14 (18) units before B and L and 4-8 units before D >> 8 units before B and L and 8-12 units before D We stopped glipizide ER and also Januvia. We tried Cycloset 0.8 mg daily >> stopped end of 11/2016 b/c GERD/AP/C We tried Trulicity >> could not tolerate it: N/V/AP. She restarted Januvia 09/2016. She was on Invokana 100 mg in am (started 12/2014) >> 2 yeast inf >> tx with Diflucan; she was exhausted  and had nocturia >> stopped 03/12/2015 Tried Victoza 2 mo ago >> AP, severe GERD, inj site rxn Tried Januvia >> tolerated it well, but stopped when Invokana was suggested.  She was also on Actos, but taken off b/c good control. She had problems tolerating insulins in the past.  She was previously on Lantus and NovoLog.  Basaglar and [place] caused eczema and also GI symptoms.  She has a [employer], structural.  She checks her sugars more than 4 times a day with her CGM:   Previously:   She has hypoglycemia awareness in the 80s.  No CKD: Lab Results  Component Value Date   BUN 10 05/10/2021   Lab Results  Component Value Date   CREATININE 0.67 05/10/2021   No MAU: Lab Results  Component Value Date   MICRALBCREAT 0.5 05/10/2021   MICRALBCREAT 28 05/09/2020   MICRALBCREAT 0.8 10/20/2018   MICRALBCREAT 0.5 02/03/2017   MICRALBCREAT 0.7 12/20/2015   + HL: Lab Results  Component Value Date   CHOL 151 11/06/2020   HDL 67.80 11/06/2020   LDLCALC 71 11/06/2020   TRIG 64.0 11/06/2020   CHOLHDL 2 11/06/2020  Not on a statin  Last eye exam 2022: reportedly No DR- [facility].  + Numbness and tingling in hand and feet.  She feels much  better on Cymbalta and Neurontin.  She had CP >> had a stress test 07/04/2015 >>  Normal.  She is on metoprolol for tachycardia. She has fibromyalgia.   She also has a history of iron deficiency anemia and had iron infusions.   In 2019, she just got out of a 5-year relationship.  She was under a lot of stress.  She is on Cymbalta for anxiety.  Review of Systems + see HPI  Past Medical History:  Diagnosis Date   ADHD (attention deficit hyperactivity disorder)    ALLERGIC RHINITIS    Allergy    Anemia    ANXIETY    Depression    DIABETES MELLITUS, TYPE II    Fibromyalgia 2018   GERD (gastroesophageal reflux disease)    MIGRAINE, COMMON    Obesity, unspecified    OVARIAN CYST    Past Surgical History:   Procedure Laterality Date   NO PAST SURGERIES     Social History   Socioeconomic History   Marital status: Single    Spouse name: Not on file   Number of children: Not on file   Years of education: Not on file   Highest education level: Not on file  Occupational History   Not on file  Tobacco Use   Smoking status: Former    Types: Cigarettes    Quit date: 08/20/2002    Years since quitting: 18.8   Smokeless tobacco: Never   Tobacco comments:    single, separated from spouse 02/2010.   Vaping Use   Vaping Use: Never used  Substance and Sexual Activity   Alcohol use: Yes    Comment: couple of drinks a week   Drug use: No   Sexual activity: Not Currently    Birth control/protection: I.U.D.  Other Topics Concern   Not on file  Social History Narrative   Single, separated from spouse 02/2010   Social Determinants of Health   Financial Resource Strain: Not on file  Food Insecurity: Not on file  Transportation Needs: Not on file  Physical Activity: Not on file  Stress: Not on file  Social Connections: Not on file  Intimate Partner Violence: Not on file   Current Outpatient Medications on File Prior to Visit  Medication Sig Dispense Refill   ALPRAZolam (XANAX) 0.5 MG tablet TAKE 1 TABLET(0.5 MG) BY MOUTH THREE TIMES DAILY AS NEEDED FOR ANXIETY 60 tablet 3   amphetamine-dextroamphetamine (ADDERALL XR) 20 MG 24 hr capsule Take 1 capsule (20 mg total) by mouth daily as needed (school or work days). 30 capsule 0   Continuous Blood Gluc Receiver (FREESTYLE LIBRE 2 READER) DEVI USE AS DIRECTED 1 each 0   Continuous Blood Gluc Sensor (FREESTYLE LIBRE 2 SENSOR) MISC APPLY EVERY 14 DAYS 2 each 5   DULoxetine (CYMBALTA) 30 MG capsule Take 1 capsule (30 mg total) by mouth daily. Take in addition to 60 mg cap for total of 90 mg daily 90 capsule 1   DULoxetine (CYMBALTA) 60 MG capsule TAKE 1 CAPSULE BY MOUTH EVERY DAY 90 capsule 1   EPINEPHrine 0.3 mg/0.3 mL IJ SOAJ injection       Fexofenadine HCl (ALLEGRA ALLERGY PO) Take by mouth.     gabapentin (NEURONTIN) 100 MG capsule Take 2 capsules (200 mg total) by mouth at bedtime. 180 capsule 1   glucose blood (ONETOUCH VERIO) test strip Use 2x a day 200 each 3   insulin NPH Human (NOVOLIN N) 100 UNIT/ML injection Inject 20-40 Units into the  skin. 36 units in am and 26 - 30 units at night time     insulin regular (NOVOLIN R RELION) 100 units/mL injection Inject 0.1-0.16 mLs (10-16 Units total) into the skin 3 (three) times daily before meals. (Patient taking differently: Inject 10-16 Units into the skin 3 (three) times daily before meals. Inject 10-16 units into the skin 3 times daily before meals) 10 mL 11   Insulin Syringe-Needle U-100 (B-D INS SYR ULTRAFINE .5CC/30G) 30G X 1/2" 0.5 ML MISC Use to inject insulin once daily. 100 each 11   levonorgestrel (MIRENA) 20 MCG/24HR IUD 1 Intra Uterine Device (1 each total) by Intrauterine route once. 1 each 0   meloxicam (MOBIC) 15 MG tablet Take 1 tablet (15 mg total) by mouth daily. 30 tablet 0   metFORMIN (GLUCOPHAGE) 1000 MG tablet TAKE 2 TABLETS BY MOUTH DAILY WITH SUPPER. TAKE 1 TABLET BY MOUTH TWICE A DAY WITH A MEAL 90 tablet 5   metoprolol succinate (TOPROL-XL) 25 MG 24 hr tablet Take 1 tablet (25 mg total) by mouth daily. 90 tablet 1   omeprazole (PRILOSEC) 40 MG capsule TAKE 1 CAPSULE (40 MG TOTAL) BY MOUTH DAILY BEFORE SUPPER. 90 capsule 1   ondansetron (ZOFRAN ODT) 4 MG disintegrating tablet Take 1 tablet (4 mg total) by mouth every 8 (eight) hours as needed for nausea or vomiting. 20 tablet 0   promethazine (PHENERGAN) 25 MG tablet Take 1 tablet (25 mg total) by mouth every 6 (six) hours as needed for nausea. 15 tablet 0   rizatriptan (MAXALT) 10 MG tablet Take 1 tablet (10 mg total) by mouth as needed for migraine. May repeat in 2 hours if needed 10 tablet 8   rosuvastatin (CRESTOR) 5 MG tablet TAKE 1 TABLET (5 MG TOTAL) BY MOUTH 2 (TWO) TIMES A WEEK. 13 tablet 3    SYRINGE-NEEDLE, DISP, 3 ML 25G X 1" 3 ML MISC Use for monthly B12 injections 12 each 0   triamcinolone cream (KENALOG) 0.1 % Apply 1 application topically 2 (two) times daily as needed (for itching). 28.4 g 1   VITAMIN B1-B12 IM Inject into the muscle.     No current facility-administered medications on file prior to visit.   Allergies  Allergen Reactions   Chicken Allergy Anaphylaxis and Shortness Of Breath   Covid-19 Mrna Vacc (Moderna) Anaphylaxis   Other Itching, Swelling and Other (See Comments)    NUTS (of ANY kind): Lips and mouth swell, but no breathing impairment & migraines and eczema are triggered   Propofol Shortness Of Breath    SOB, chest tightness, wheezing after colonoscopy on 09-18-15   Basaglar Kwikpen [Insulin Glargine] Palpitations and Hypertension   Adhesive [Tape] Other (See Comments)    Makes the skin VERY RED    Gluten Meal Diarrhea   Peanut-Containing Drug Products Itching, Swelling and Other (See Comments)    Lips and mouth swell, but no breathing impairment & migraines and eczema are triggered    Novolog [Insulin Aspart] Rash   Tyler Aas [Insulin Degludec] Rash    eczema   Victoza [Liraglutide] Rash   Family History  Problem Relation Age of Onset   Diabetes Mother    Hyperlipidemia Mother    CAD Mother    CVA Mother    Diabetes Father    Hyperlipidemia Father    Coronary artery disease Father    Hypertension Father    Cancer Father        esoph   Stroke Father    Cancer  Maternal Grandfather 85       Prostate and Lung   Diabetes Sister        Gestational   Graves' disease Maternal Aunt    Cancer Maternal Uncle        Brain   Stroke Paternal Grandmother    Heart disease Paternal Grandmother    Stroke Paternal Grandfather    Heart disease Paternal Grandfather    Diabetes Sister        Gestational and Type II   Hashimoto's thyroiditis Cousin    Cancer Maternal Uncle        Brain       Objective:   Physical Exam BP 140/90 (BP Location:  Left Arm, Patient Position: Sitting, Cuff Size: Normal)   Pulse (!) 108   Ht 5\' 4"  (1.626 m)   Wt 231 lb 12.8 oz (105.1 kg)   SpO2 96%   BMI 39.79 kg/m   Wt Readings from Last 3 Encounters:  07/01/21 231 lb 12.8 oz (105.1 kg)  05/10/21 234 lb (106.1 kg)  05/10/21 234 lb (106.1 kg)   Constitutional: overweight, in NAD Eyes: PERRLA, EOMI, no exophthalmos ENT: moist mucous membranes, no thyromegaly, no cervical lymphadenopathy Cardiovascular: Tachycardia, RR, No MRG Respiratory: CTA B Gastrointestinal: abdomen soft, NT, ND, BS+ Musculoskeletal: no deformities, strength intact in all 4 Skin: moist, warm, no rashes Neurological: no tremor with outstretched hands, DTR normal in all 4  Assessment:     1. DM2, insulin-dependent, uncontrolled, without complications - r/o autoimmunity Component     Latest Ref Rng 08/17/2012  Glutamic Acid Decarb Ab     <=1.0 U/mL <1.0  Pancreatic Islet Cell Antibody     < 5 JDF Units <5  C-Peptide     0.80 - 3.90 ng/mL 1.84  Glucose     70 - 99 mg/dL 104 (H)     2. Obesity class II BMI Classification: < 18.5 underweight  18.5-24.9 normal weight  25.0-29.9 overweight  30.0-34.9 class I obesity  35.0-39.9 class II obesity  ? 40.0 class III obesity   3. HL  Plan:     1. Patient with longstanding, uncontrolled, type 2 diabetes, on metformin and basal-bolus insulin regimen, with improved control at the last few visits.  Her sugars improved further after she obtained a freestyle libre CGM.  Unfortunately, we could not use SGLT2 inhibitors and GLP-1 receptor agonist in the past due to previous intolerance.  Also, she could not tolerate entire dose of metformin with dinner so she is taking now this twice a day.  We also had to switch from NovoLog to regular insulin due to the rash.  She had intolerances to other insulins so we have to continue with NPH and regular insulin for now.  At last visit, sugars were much less fluctuating compared to before.   Sugars were higher from 4 AM to 7 AM and also after dinner.  Upon questioning, she was occasionally missing the NPH at night if sugars are dropping too low after taking the recommended dose.  We decreased the dose of NPH at night at that time and we discussed that if the sugars were dropping during the day, she could also decrease the morning NPH dose.  We did not change the regular doses.  I referred her to nutrition at last visit per her request and she did have the nutrition appointment. -At last visit we discussed about the possibility of retrying a GLP-1 receptor agonist now that she is feeling much better,  and she was open to the idea.  We did not start this yet. CGM interpretation: -At today's visit, we reviewed her CGM downloads: It appears that 83% of values are in target range (goal >70%), while 30% are higher than 180 (goal <25%), and 4% are lower than 70 (goal <4%).  The calculated average blood sugar is 132.  The projected HbA1c for the next 3 months (GMI) is 6.5%. -Reviewing the CGM trends, it appears that her sugars improved compared to last visit.  In fact, I believe that these are probably the best blood sugars that I have seen for her in the last few years.  Sugars appear to be excellent after lunch, but they are quite fluctuating in the second half of the night and in the morning.  The majority of the blood sugars are still within the target range, but with increased variability.  Reviewing individual daily traces, she occasionally drops her blood sugars in the middle of the night under 70 but occasionally today go above 180.  There is no clear pattern and even though I feel that these fluctuations are related to the NPH dosing, I cannot clearly correlate with higher doses with the blood sugar drops and lower doses with her blood sugar increases.  At this visit, we discussed about possibly reducing the NPH dose at night but in this case, we again discussed about possibly trying a GLP-1  receptor agonist.  She mentions that her sister is taking this and had great success with weight loss and diabetes control.  She would be interested in starting Ozempic at the low dose.  For now, I advised her to NPH in the morning and at night but will keep the regular insulin doses the same.  I did advise her that she may need to reduce these, also, after Ozempic reaches equilibrium concentration. - I suggested to:  Patient Instructions  Please continue: - Metformin 1000 mg 2x a day with meals - NPH 26-28 units and 22-24 units at bedtime - Regular insulin 8 units before B and L and 8-12 units before D  Please return in 3-4 months.  - we checked her HbA1c: 7.6% (but higher than expected from her CGM) - advised to check sugars at different times of the day - 4x a day, rotating check times - advised for yearly eye exams >> she is UTD - return to clinic in 3-4 months    2. Obesity class II -She gained 11 pounds before the last 2 visits combined. -We will continue metformin which is weight stabilizing long-term.   -At this visit, we will also retry a GLP-1 receptor agonist I suggested Ozempic -At this visit, she lost 7 pounds  3. HL -Reviewed latest lipid panel from 10/2020: Fractions at goal Lab Results  Component Value Date   CHOL 151 11/06/2020   HDL 67.80 11/06/2020   LDLCALC 71 11/06/2020   TRIG 64.0 11/06/2020   CHOLHDL 2 11/06/2020  -She is not on a statin  Philemon Kingdom, MD PhD [facility]

## 2021-07-01 NOTE — Patient Instructions (Signed)
Please continue: - Metformin 1000 mg 2x a day with meals  Decrease: - NPH 26 units and 18-20 units at bedtime - Regular insulin 8-10 units before meals  Please start Ozempic: - 0.25 mg weekly in a.m. (for example on Sunday morning) x 4 weeks, then increase to 0.5 mg weekly in a.m. if no nausea or hypoglycemia.  Please return in 3-4 months.

## 2021-07-02 ENCOUNTER — Ambulatory Visit: Payer: No Typology Code available for payment source | Admitting: Physical Therapy

## 2021-07-02 ENCOUNTER — Encounter: Payer: Self-pay | Admitting: Physical Therapy

## 2021-07-02 DIAGNOSIS — R600 Localized edema: Secondary | ICD-10-CM

## 2021-07-02 DIAGNOSIS — M25671 Stiffness of right ankle, not elsewhere classified: Secondary | ICD-10-CM | POA: Diagnosis not present

## 2021-07-02 DIAGNOSIS — R2689 Other abnormalities of gait and mobility: Secondary | ICD-10-CM

## 2021-07-02 DIAGNOSIS — M25571 Pain in right ankle and joints of right foot: Secondary | ICD-10-CM

## 2021-07-02 DIAGNOSIS — R252 Cramp and spasm: Secondary | ICD-10-CM

## 2021-07-02 NOTE — Patient Instructions (Signed)
Access Code: [email or web address] URL: [email or web address] Date: 07/02/2021 Prepared by: Ethel Rana  Exercises Single Leg Stance - 1 x daily - 7 x weekly - 4 reps - 20 hold Leg Swing Single Leg Balance - 1 x daily - 7 x weekly - 4 reps - 20 hold Arch Lifting - 1 x daily - 7 x weekly - 2 sets - 10 reps Seated Anterior Tibialis Stretch - 1 x daily - 7 x weekly - 3 reps - 20 hold

## 2021-07-02 NOTE — Therapy (Signed)
[place]. [place], [place], [address] Phone: [phone]   Fax:  [phone]  Physical Therapy Treatment  Patient Details  Name: Lisa Duran MRN: [id number] Date of Birth: 01-Apr-1981 Referring Provider (PT): Glennon Mac, DO   Encounter Date: 07/02/2021   PT End of Session - 07/02/21 0842     Visit Number 6    Number of Visits 12    Date for PT Re-Evaluation 07/17/21    PT Start Time 0801    PT Stop Time 0840    PT Time Calculation (min) 39 min    Activity Tolerance Patient tolerated treatment well    Behavior During Therapy [facility] for tasks assessed/performed             Past Medical History:  Diagnosis Date   ADHD (attention deficit hyperactivity disorder)    ALLERGIC RHINITIS    Allergy    Anemia    ANXIETY    Depression    DIABETES MELLITUS, TYPE II    Fibromyalgia 2018   GERD (gastroesophageal reflux disease)    MIGRAINE, COMMON    Obesity, unspecified    OVARIAN CYST     Past Surgical History:  Procedure Laterality Date   NO PAST SURGERIES      There were no vitals filed for this visit.   Subjective Assessment - 07/02/21 0804     Subjective Patient reports acute tenderness resolved the next day and she has had just her normal tenderness since then. She arrives wearing different shoes, which she felt able to wear due to improved pain, they are still supportive.    Pertinent History anemia, DM, fibromyalgia, anx/dep, torn Rt calf muscle 2019    How long can you walk comfortably? cannot walk normally at this time    Diagnostic tests [place] no tears    Patient Stated Goals get back to walking big dog and hiking; going up/down stairs    Currently in Pain? Yes    Pain Score 1     Pain Location Foot    Pain Orientation Right    Pain Descriptors / Indicators Aching    Pain Type Acute pain    Pain Onset More than a month ago    Pain Frequency Intermittent    Multiple Pain Sites No                                [facility] Adult PT Treatment/Exercise - 07/02/21 0001       Ambulation/Gait   Gait Comments Patient's gait demosntrates improved weight shift onto RLE.      Manual Therapy   Manual Therapy Soft tissue mobilization;Passive ROM    Manual therapy comments Improved spasm, but she reports continued foot spasms.    Soft tissue mobilization Cross friction massage to post tibialis muscle and tendon as well as gastroc and soleus.    Passive ROM PROM after STM for DF, Inversion/eversion      Ankle Exercises: Seated   Towel Crunch 5 reps    Towel Inversion/Eversion 5 reps    Towel Inversion/Eversion Limitations Introduced Red theraband resistance, 2 x 10 reps each    [place] Pickup introduced for HEP      Ankle Exercises: Standing   Other Standing Ankle Exercises single limb stance on R, static initially, progress to small LLE movments to increase challenge.      Ankle Exercises: Aerobic   Stationary  Bike 3.0 x 5 minutes.                     PT Education - 07/02/21 [phone]     Education Details Progressed HEP as noted.    Person(s) Educated Patient    Methods Handout;Demonstration;Explanation    Comprehension Verbalized understanding;Returned demonstration              PT Short Term Goals - 06/18/21 1404       PT SHORT TERM GOAL #1   Title I with initial HEP    Baseline Exrcises added    Time 4    Period Weeks    Status Achieved    Target Date 07/09/21               PT Long Term Goals - 07/02/21 0845       PT LONG TERM GOAL #1   Title improved right ankle DF to 5-10 deg actively to normalize gait    Time 3    Period Weeks    Status Achieved      PT LONG TERM GOAL #2   Title Able to complete >= 10 Rt single leg heel raises without difficulty or pain increase    Baseline Able to complete B heel raises, but not SLS    Time 2    Period Weeks    Status On-going      PT LONG TERM GOAL #3   Title able to  ascend/descend stairs with a reciprocal gait pattern and no increase in right foot pain    Baseline N/T due to pain    Time 3    Period Weeks    Status On-going      PT LONG TERM GOAL #4   Title improved right SLS to >= 10 sec    Baseline 8 sec    Time 2    Period Weeks    Status On-going      PT LONG TERM GOAL #5   Title able to safely amb community distances on level and unlevel surfaces with a normal gait pattern and without increased foot pain.    Baseline Limited gait distances.    Time 2    Period Weeks    Status On-going      PT LONG TERM GOAL #6   Title ind with HEP and its progression    Time 3    Period Weeks    Status On-going                   Plan - 07/02/21 0843     Clinical Impression Statement Patient reports her acute tendon pain resoved after 1-2 days and she has progressed since then. She continues to have spasms in R calf and reports R foot spasms as well. She tolerated STM and stretch well, progressed HEP to include single limb stance on R, which she also tolerated today with no C/O increaed pain. Advised to progress HEP slowy and monitor her response to increasing activities.    Personal Factors and Comorbidities Comorbidity 3+    Comorbidities anemia, DM, fibromyalgia, anx/dep, torn Rt calf muscle 2019    Examination-Activity Limitations Stairs;Locomotion Level    Stability/Clinical Decision Making Stable/Uncomplicated    Clinical Decision Making Low    Rehab Potential Excellent    PT Frequency 1x / week    PT Duration 2 weeks   5w   PT Treatment/Interventions ADLs/Self Care Home Management;Cryotherapy;Electrical Stimulation;Iontophoresis 4mg /ml Dexamethasone;Moist Heat;Neuromuscular  re-education;Balance training;Therapeutic exercise;Therapeutic activities;Stair training;Gait training;Patient/family education;Manual techniques;Dry needling    PT Next Visit Plan PRogress HEP as tolerated.    PT Home Exercise Plan [id number],  [place],  [place],   [place] and Agree with Plan of Care Patient             Patient will benefit from skilled therapeutic intervention in order to improve the following deficits and impairments:  Difficulty walking, Decreased range of motion, Pain, Decreased balance, Decreased strength, Impaired flexibility, Increased muscle spasms  Visit Diagnosis: Stiffness of right ankle, not elsewhere classified  Localized edema  Pain in right ankle and joints of right foot  Other abnormalities of gait and mobility  Cramp and spasm     Problem List Patient Active Problem List   Diagnosis Date Noted   B12 deficiency 05/09/2021   Dyslipidemia 04/14/2019   Enteritis 01/12/2019   Sleep walking disorder 04/06/2017   Iron deficiency anemia 03/27/2017   Fatigue 02/03/2017   Arthralgia 02/03/2017   Fibromyalgia 02/03/2017   GERD (gastroesophageal reflux disease) 06/24/2015   Eczema 06/22/2015   Depression 06/22/2015   Tachycardia 06/16/2014   Paresthesias 06/12/2014   Food allergy    ADHD (attention deficit hyperactivity disorder) 01/27/2011   Anxiety 02/20/2010   ALLERGIC RHINITIS 11/21/2009   OVARIAN CYST 11/21/2009   Diabetes mellitus type 2 with neurological manifestations ([place]) 11/20/2009   Class 2 obesity in adult 11/20/2009   Migraine without aura 11/20/2009    Marcelina Morel, DPT 07/02/2021, 10:25 AM  [place]. [place], [place], [address] Phone: [phone]   Fax:  [phone]  Name: Lisa Duran MRN: [id number] Date of Birth: 02/27/1981

## 2021-07-08 ENCOUNTER — Encounter: Payer: Self-pay | Admitting: Internal Medicine

## 2021-07-08 ENCOUNTER — Other Ambulatory Visit (HOSPITAL_COMMUNITY): Payer: Self-pay

## 2021-07-08 ENCOUNTER — Telehealth: Payer: Self-pay | Admitting: Pharmacy Technician

## 2021-07-08 ENCOUNTER — Encounter: Payer: Self-pay | Admitting: Hematology

## 2021-07-08 NOTE — Telephone Encounter (Signed)
Patient Advocate Encounter  Received notification from [place] that prior authorization for [facility] 2MG /1.5ML is required.   PA submitted on 11.28.22 Key [id number] Status is pending   [facility] will continue to follow  Luciano Cutter, CPhT Patient Advocate [facility] Phone: [phone] Fax:  [phone]

## 2021-07-09 ENCOUNTER — Ambulatory Visit: Payer: No Typology Code available for payment source | Admitting: Physical Therapy

## 2021-07-09 ENCOUNTER — Other Ambulatory Visit (HOSPITAL_COMMUNITY): Payer: Self-pay

## 2021-07-09 NOTE — Telephone Encounter (Signed)
Received notification from [place] regarding a prior authorization for Lisa Duran. Authorization has been APPROVED from 11.28.22 to 1.28.23.    Authorization # [id number]

## 2021-07-11 ENCOUNTER — Ambulatory Visit: Payer: No Typology Code available for payment source | Attending: Sports Medicine | Admitting: Physical Therapy

## 2021-07-11 ENCOUNTER — Encounter: Payer: Self-pay | Admitting: Physical Therapy

## 2021-07-11 ENCOUNTER — Other Ambulatory Visit: Payer: Self-pay

## 2021-07-11 DIAGNOSIS — R600 Localized edema: Secondary | ICD-10-CM | POA: Diagnosis present

## 2021-07-11 DIAGNOSIS — R252 Cramp and spasm: Secondary | ICD-10-CM | POA: Insufficient documentation

## 2021-07-11 DIAGNOSIS — R2689 Other abnormalities of gait and mobility: Secondary | ICD-10-CM | POA: Insufficient documentation

## 2021-07-11 DIAGNOSIS — M25571 Pain in right ankle and joints of right foot: Secondary | ICD-10-CM | POA: Diagnosis present

## 2021-07-11 DIAGNOSIS — M25671 Stiffness of right ankle, not elsewhere classified: Secondary | ICD-10-CM | POA: Diagnosis not present

## 2021-07-11 NOTE — Therapy (Signed)
[place]. [place], [place], [address] Phone: [phone]   Fax:  [phone]  Physical Therapy Treatment  Patient Details  Name: Lisa Duran MRN: [id number] Date of Birth: September 03, 1980 Referring Provider (PT): Glennon Mac, DO   Encounter Date: 07/11/2021   PT End of Session - 07/11/21 0844     Visit Number 7    Number of Visits 12    Date for PT Re-Evaluation 07/17/21    PT Start Time 0801    PT Stop Time 0843    PT Time Calculation (min) 42 min    Activity Tolerance Patient tolerated treatment well    Behavior During Therapy [facility] for tasks assessed/performed             Past Medical History:  Diagnosis Date   ADHD (attention deficit hyperactivity disorder)    ALLERGIC RHINITIS    Allergy    Anemia    ANXIETY    Depression    DIABETES MELLITUS, TYPE II    Fibromyalgia 2018   GERD (gastroesophageal reflux disease)    MIGRAINE, COMMON    Obesity, unspecified    OVARIAN CYST     Past Surgical History:  Procedure Laterality Date   NO PAST SURGERIES      There were no vitals filed for this visit.   Subjective Assessment - 07/11/21 0805     Subjective Patient reports less pain. sahe can now perform "a couple" single limb heel raises on her R. She is progressing with T band exercises. She reports continued muscle spasms in R foot.    Pertinent History anemia, DM, fibromyalgia, anx/dep, torn Rt calf muscle 2019    How long can you walk comfortably? cannot walk normally at this time    Diagnostic tests [place] no tears    Patient Stated Goals get back to walking big dog and hiking; going up/down stairs    Currently in Pain? No/denies    Pain Onset More than a month ago                               [facility] Adult PT Treatment/Exercise - 07/11/21 0001       Vasopneumatic   Number Minutes Vasopneumatic  15 minutes    Vasopnuematic Location  Ankle    Vasopneumatic Pressure  Medium    Vasopneumatic Temperature  34      Manual Therapy   Manual Therapy Soft tissue mobilization;Passive ROM    Manual therapy comments foot spasms continue    Soft tissue mobilization Cross friction massage to post tibialis muscle and tendon as well as gastroc and soleus, and intrinsic foot muscles.    Passive ROM PROM after STM for DF, Inversion/eversion toe extension.      Ankle Exercises: Aerobic   Stationary Bike 3.5 x 6 minutes      Ankle Exercises: Standing   SLS R LE SLS on solid surface, moving LLE forward and back. Also performed arch crunches wtih small for/back weihgt hsifts over r foot in SLS.    Rocker Board 1 minute    Other Standing Ankle Exercises rocker board, B and U rocking forward and back, lateral, 10 reps each.                     PT Education - 07/11/21 0845     Education Details Continued progression of HEP, slowly as challenges increaes.  Person(s) Educated Patient    Methods Explanation    Comprehension Other (comment)              PT Short Term Goals - 06/18/21 1404       PT SHORT TERM GOAL #1   Title I with initial HEP    Baseline Exrcises added    Time 4    Period Weeks    Status Achieved    Target Date 07/09/21               PT Long Term Goals - 07/11/21 1200       PT LONG TERM GOAL #1   Title improved right ankle DF to 5-10 deg actively to normalize gait    Time 3    Period Weeks    Status Achieved      PT LONG TERM GOAL #2   Title Able to complete >= 10 Rt single leg heel raises without difficulty or pain increase    Baseline Able to complete 2-3.    Time 2    Period Weeks    Status On-going      PT LONG TERM GOAL #3   Title able to ascend/descend stairs with a reciprocal gait pattern and no increase in right foot pain    Baseline N/T due to pain    Time 3    Period Weeks    Status On-going      PT LONG TERM GOAL #4   Title improved right SLS to >= 10 sec    Baseline > 10 sec    Time 2     Period Weeks    Status Achieved      PT LONG TERM GOAL #5   Title able to safely amb community distances on level and unlevel surfaces with a normal gait pattern and without increased foot pain.    Baseline Patient reports improved pain with walking, but still limits her walks with her dog.    Time 2    Period Weeks    Status On-going      PT LONG TERM GOAL #6   Title ind with HEP and its progression    Time 3    Period Weeks    Status On-going                   Plan - 07/11/21 0844     Clinical Impression Statement Patient is prgressing well, with decresaed pain, but continues to have spasms in foot, with trigger points in foot and along post tib muscle and tendon. She reports noted instability and weakness in R foot and ankle in SLS, but is improving with practice.    Personal Factors and Comorbidities Comorbidity 3+    Comorbidities anemia, DM, fibromyalgia, anx/dep, torn Rt calf muscle 2019    Examination-Activity Limitations Stairs;Locomotion Level    Stability/Clinical Decision Making Stable/Uncomplicated    Clinical Decision Making Low    Rehab Potential Excellent    PT Frequency 1x / week    PT Duration 2 weeks   5w   PT Treatment/Interventions ADLs/Self Care Home Management;Cryotherapy;Electrical Stimulation;Iontophoresis 4mg /ml Dexamethasone;Moist Heat;Neuromuscular re-education;Balance training;Therapeutic exercise;Therapeutic activities;Stair training;Gait training;Patient/family education;Manual techniques;Dry needling    PT Next Visit Plan Progress HEP as tolerated.    PT Home Exercise Plan [id number],  [place],  [place],  [place] and Agree with Plan of Care Patient             Patient will benefit from  skilled therapeutic intervention in order to improve the following deficits and impairments:  Difficulty walking, Decreased range of motion, Pain, Decreased balance, Decreased strength, Impaired flexibility, Increased muscle spasms  Visit  Diagnosis: Stiffness of right ankle, not elsewhere classified  Localized edema  Pain in right ankle and joints of right foot  Other abnormalities of gait and mobility  Cramp and spasm     Problem List Patient Active Problem List   Diagnosis Date Noted   B12 deficiency 05/09/2021   Dyslipidemia 04/14/2019   Enteritis 01/12/2019   Sleep walking disorder 04/06/2017   Iron deficiency anemia 03/27/2017   Fatigue 02/03/2017   Arthralgia 02/03/2017   Fibromyalgia 02/03/2017   GERD (gastroesophageal reflux disease) 06/24/2015   Eczema 06/22/2015   Depression 06/22/2015   Tachycardia 06/16/2014   Paresthesias 06/12/2014   Food allergy    ADHD (attention deficit hyperactivity disorder) 01/27/2011   Anxiety 02/20/2010   ALLERGIC RHINITIS 11/21/2009   OVARIAN CYST 11/21/2009   Diabetes mellitus type 2 with neurological manifestations ([place]) 11/20/2009   Class 2 obesity in adult 11/20/2009   Migraine without aura 11/20/2009    Lisa Duran, Lisa Duran 07/11/2021, 12:03 PM  [place]. [place], [place], [address] Phone: [phone]   Fax:  [phone]  Name: Lisa Duran MRN: [id number] Date of Birth: 1980/10/23

## 2021-07-18 ENCOUNTER — Ambulatory Visit: Payer: No Typology Code available for payment source | Admitting: Physical Therapy

## 2021-07-18 ENCOUNTER — Encounter: Payer: Self-pay | Admitting: Physical Therapy

## 2021-07-18 ENCOUNTER — Other Ambulatory Visit: Payer: Self-pay

## 2021-07-18 DIAGNOSIS — M25671 Stiffness of right ankle, not elsewhere classified: Secondary | ICD-10-CM

## 2021-07-18 DIAGNOSIS — R2689 Other abnormalities of gait and mobility: Secondary | ICD-10-CM

## 2021-07-18 DIAGNOSIS — R252 Cramp and spasm: Secondary | ICD-10-CM

## 2021-07-18 DIAGNOSIS — R600 Localized edema: Secondary | ICD-10-CM

## 2021-07-18 DIAGNOSIS — M25571 Pain in right ankle and joints of right foot: Secondary | ICD-10-CM

## 2021-07-18 NOTE — Therapy (Signed)
[place]. [place], [place], [address] Phone: [phone]   Fax:  [phone]  Physical Therapy Treatment and Discharge Summary  PHYSICAL THERAPY DISCHARGE SUMMARY  Visits from Start of Care: 8  Current functional level related to goals / functional outcomes: All goals met except she is performing 5 single limb heel raises on R, progressing toward LTG of 10.   Remaining deficits: R gastroc weakness   Education / Equipment: HEP   Patient agrees to discharge. Patient goals were partially met. Patient is being discharged due to maximized rehab potential.   Patient Details  Name: Lisa Duran MRN: [id number] Date of Birth: 01/17/1981 Referring Provider (PT): Glennon Mac, DO   Encounter Date: 07/18/2021   PT End of Session - 07/18/21 0828     Visit Number 8    Number of Visits 12    Date for PT Re-Evaluation 07/17/21    PT Start Time 0803    PT Stop Time 0829    PT Time Calculation (min) 26 min    Activity Tolerance Patient tolerated treatment well    Behavior During Therapy [facility] for tasks assessed/performed             Past Medical History:  Diagnosis Date   ADHD (attention deficit hyperactivity disorder)    ALLERGIC RHINITIS    Allergy    Anemia    ANXIETY    Depression    DIABETES MELLITUS, TYPE II    Fibromyalgia 2018   GERD (gastroesophageal reflux disease)    MIGRAINE, COMMON    Obesity, unspecified    OVARIAN CYST     Past Surgical History:  Procedure Laterality Date   NO PAST SURGERIES      There were no vitals filed for this visit.   Subjective Assessment - 07/18/21 0825     Subjective Patient reports she is progressing well with HEP, feels like she is ready to take over on her own at this point. Continues to have spasms in her foot at night.    Pertinent History anemia, DM, fibromyalgia, anx/dep, torn Rt calf muscle 2019    How long can you walk comfortably? cannot walk  normally at this time    Diagnostic tests [place] no tears    Patient Stated Goals get back to walking big dog and hiking; going up/down stairs    Currently in Pain? No/denies    Pain Onset More than a month ago                [facility] PT Assessment - 07/18/21 0001       AROM   Right Ankle Dorsiflexion 17    Right Ankle Inversion 27    Right Ankle Eversion 18                           [facility] Adult PT Treatment/Exercise - 07/18/21 0001       Ankle Exercises: Aerobic   Stationary Bike 3.5 x 5 minutes      Ankle Exercises: Standing   Other Standing Ankle Exercises Standing on RLE on physiodisc with BUE support, weight shifting on foot to activate arch.                     PT Education - 07/18/21 0828     Education Details How to progress HEP, SLS, heel raises on R.    Person(s) Educated Patient  Methods Explanation    Comprehension Verbalized understanding              PT Short Term Goals - 06/18/21 1404       PT SHORT TERM GOAL #1   Title I with initial HEP    Baseline Exrcises added    Time 4    Period Weeks    Status Achieved    Target Date 07/09/21               PT Long Term Goals - 07/18/21 0824       PT LONG TERM GOAL #1   Title improved right ankle DF to 5-10 deg actively to normalize gait    Baseline 17    Time 3    Period Weeks    Status Achieved      PT LONG TERM GOAL #2   Title Able to complete >= 10 Rt single leg heel raises without difficulty or pain increase    Baseline Able to complete 5    Time 2    Period Weeks    Status Not Met      PT LONG TERM GOAL #3   Title able to ascend/descend stairs with a reciprocal gait pattern and no increase in right foot pain    Baseline N/T due to pain    Time 3    Period Weeks    Status Achieved      PT LONG TERM GOAL #4   Title improved right SLS to >= 10 sec    Baseline > 10 sec    Time 2    Period Weeks    Status Achieved      PT LONG TERM GOAL #5    Title able to safely amb community distances on level and unlevel surfaces with a normal gait pattern and without increased foot pain.    Baseline Patient reports improved pain with walking, and is progressing in distance.    Time 2    Period Weeks    Status Achieved      PT LONG TERM GOAL #6   Title ind with HEP and its progression    Time 3    Period Weeks    Status Achieved                   Plan - 07/18/21 0829     Clinical Impression Statement Patient has progressed to the point she is able to take over further rehab, with an HEP that will accomodate improvement. She continues to have muscle cramps in her foot at night, but she is progressing with ROM and strength in R ankle and foot.    Personal Factors and Comorbidities Comorbidity 3+    Comorbidities anemia, DM, fibromyalgia, anx/dep, torn Rt calf muscle 2019    Examination-Activity Limitations Stairs;Locomotion Level    Stability/Clinical Decision Making Stable/Uncomplicated    Rehab Potential Excellent    PT Frequency 1x / week    PT Duration 2 weeks   5w   PT Treatment/Interventions ADLs/Self Care Home Management;Cryotherapy;Electrical Stimulation;Iontophoresis 63m/ml Dexamethasone;Moist Heat;Neuromuscular re-education;Balance training;Therapeutic exercise;Therapeutic activities;Stair training;Gait training;Patient/family education;Manual techniques;Dry needling    PT Next Visit Plan Progress HEP as tolerated.    PT Home Exercise Plan [id number]  [place]gree with Plan of Care Patient             Patient will benefit from skilled therapeutic intervention in order to improve the following  deficits and impairments:  Difficulty walking, Decreased range of motion, Pain, Decreased balance, Decreased strength, Impaired flexibility, Increased muscle spasms  Visit Diagnosis: Stiffness of right ankle, not elsewhere classified  Localized edema  Pain in right ankle and joints  of right foot  Other abnormalities of gait and mobility  Cramp and spasm     Problem List Patient Active Problem List   Diagnosis Date Noted   B12 deficiency 05/09/2021   Dyslipidemia 04/14/2019   Enteritis 01/12/2019   Sleep walking disorder 04/06/2017   Iron deficiency anemia 03/27/2017   Fatigue 02/03/2017   Arthralgia 02/03/2017   Fibromyalgia 02/03/2017   GERD (gastroesophageal reflux disease) 06/24/2015   Eczema 06/22/2015   Depression 06/22/2015   Tachycardia 06/16/2014   Paresthesias 06/12/2014   Food allergy    ADHD (attention deficit hyperactivity disorder) 01/27/2011   Anxiety 02/20/2010   ALLERGIC RHINITIS 11/21/2009   OVARIAN CYST 11/21/2009   Diabetes mellitus type 2 with neurological manifestations ([place]) 11/20/2009   Class 2 obesity in adult 11/20/2009   Migraine without aura 11/20/2009    Marcelina Morel, DPT 07/18/2021, 8:32 AM  [place]. [place], [place], [address] Phone: [phone]   Fax:  [phone]  Name: Lisa Duran MRN: [id number] Date of Birth: 04-Jun-1981

## 2021-07-19 ENCOUNTER — Other Ambulatory Visit: Payer: Self-pay | Admitting: Internal Medicine

## 2021-08-02 ENCOUNTER — Other Ambulatory Visit: Payer: Self-pay | Admitting: Internal Medicine

## 2021-08-02 MED ORDER — AMPHETAMINE-DEXTROAMPHET ER 20 MG PO CP24: 20.0000 mg | ORAL_CAPSULE | Freq: Every day | ORAL | 0 refills | Status: DC | PRN

## 2021-08-15 ENCOUNTER — Encounter: Payer: Self-pay | Admitting: Internal Medicine

## 2021-08-22 ENCOUNTER — Encounter: Payer: Self-pay | Admitting: Internal Medicine

## 2021-08-22 DIAGNOSIS — E1149 Type 2 diabetes mellitus with other diabetic neurological complication: Secondary | ICD-10-CM

## 2021-08-22 MED ORDER — "BD INSULIN SYRINGE ULTRAFINE 30G X 1/2"" 0.5 ML MISC": 11 refills | Status: DC

## 2021-08-22 MED ORDER — "SYRINGE/NEEDLE (DISP) 25G X 1"" 3 ML MISC": 0 refills | Status: DC

## 2021-09-09 ENCOUNTER — Other Ambulatory Visit: Payer: Self-pay | Admitting: Pharmacy Technician

## 2021-09-09 ENCOUNTER — Other Ambulatory Visit: Payer: Self-pay | Admitting: Internal Medicine

## 2021-09-10 MED ORDER — AMPHETAMINE-DEXTROAMPHET ER 20 MG PO CP24: 20.0000 mg | ORAL_CAPSULE | Freq: Every day | ORAL | 0 refills | Status: DC | PRN

## 2021-09-27 ENCOUNTER — Other Ambulatory Visit: Payer: Self-pay | Admitting: Internal Medicine

## 2021-09-30 ENCOUNTER — Encounter: Payer: Self-pay | Admitting: Internal Medicine

## 2021-10-08 ENCOUNTER — Encounter: Payer: Self-pay | Admitting: Internal Medicine

## 2021-10-08 ENCOUNTER — Other Ambulatory Visit: Payer: Self-pay

## 2021-10-08 ENCOUNTER — Ambulatory Visit: Payer: No Typology Code available for payment source | Admitting: Internal Medicine

## 2021-10-08 VITALS — BP 128/88 | HR 128 | Ht 64.0 in | Wt 209.6 lb

## 2021-10-08 DIAGNOSIS — E785 Hyperlipidemia, unspecified: Secondary | ICD-10-CM

## 2021-10-08 DIAGNOSIS — Z6839 Body mass index (BMI) 39.0-39.9, adult: Secondary | ICD-10-CM | POA: Diagnosis not present

## 2021-10-08 DIAGNOSIS — E1149 Type 2 diabetes mellitus with other diabetic neurological complication: Secondary | ICD-10-CM

## 2021-10-08 LAB — POCT GLYCOSYLATED HEMOGLOBIN (HGB A1C): Hemoglobin A1C: 6.9 % — AB (ref 4.0–5.6)

## 2021-10-08 MED ORDER — HUMULIN N KWIKPEN 100 UNIT/ML ~~LOC~~ SUPN: 10.0000 [IU] | PEN_INJECTOR | Freq: Two times a day (BID) | SUBCUTANEOUS | 3 refills | Status: DC

## 2021-10-08 NOTE — Progress Notes (Signed)
=ubjective:     Patient ID: Lisa Duran, female   DOB: [birth year], 41 y.o.   MRN: [id number]  This visit occurred during the SARS-CoV-2 public health emergency.  Safety protocols were in place, including screening questions prior to the visit, additional usage of staff PPE, and extensive cleaning of exam room while observing appropriate contact time as indicated for disinfecting solutions.   HPI Lisa Duran is a pleasant 41 y.o.  woman returning for f/u for DM2, insulin dependent, uncontrolled, dx 65/4650, w/o complications. Last visit 4 months ago.  Interim history: No increased urination, blurry vision, nausea, chest pain. She had Covid 08/2021. She started Ozempic >> sugars improved tremendously.  However, she still has constipation, gas, nausea >> improved; but also has joint and mm aches >> not improved. She had back pain - improved with hydration.   Reviewed HbA1c levels: Lab Results  Component Value Date   HGBA1C 7.6 (A) 07/01/2021   HGBA1C 7.5 (A) 02/21/2021   HGBA1C 7.6 (A) 10/16/2020   HGBA1C 8.6 (A) 06/12/2020   HGBA1C 8.5 (A) 11/07/2019   HGBA1C 8.4 (A) 08/09/2019   HGBA1C 9.1 (A) 04/14/2019   HGBA1C 9.0 (H) 10/20/2018   HGBA1C 7.2 (A) 05/27/2018   HGBA1C 9.4 (A) 01/22/2018   She is on:   - Metformin 1000 mg 2x a day  - NPH 26-28 units and 22-24 units at bedtime >> 0-12 units 2x a day - Regular  8 units before B and L and 8-12 units before D >> 3-6 1-2x a day - Ozempic 0.25 >> 0.5 mg weekly - started 07/19/2021 We stopped glipizide ER and also Januvia. We tried Cycloset 0.8 mg daily >> stopped end of 11/2016 b/c GERD/AP/C We tried Trulicity >> could not tolerate it: N/V/AP. She restarted Januvia 09/2016. She was on Invokana 100 mg in am (started 12/2014) >> 2 yeast inf >> tx with Diflucan; she was exhausted and had nocturia >> stopped 03/12/2015 Tried Victoza 2 mo ago >> AP, severe GERD, inj site rxn Tried Januvia >> tolerated it well, but stopped when Invokana was  suggested.  She was also on Actos, but taken off b/c good control. She had problems tolerating insulins in the past.  She was previously on Lantus and NovoLog.  Basaglar and [place] caused eczema and also GI symptoms.  She has a [employer], structural.  She checks her sugars more than 4 times a day with her CGM -changed to Dexcom CGM since last visit:   Previously:   Previously:   She has hypoglycemia awareness in the 80s.  No CKD: Lab Results  Component Value Date   BUN 10 05/10/2021   Lab Results  Component Value Date   CREATININE 0.67 05/10/2021   No MAU: Lab Results  Component Value Date   MICRALBCREAT 0.5 05/10/2021   MICRALBCREAT 28 05/09/2020   MICRALBCREAT 0.8 10/20/2018   MICRALBCREAT 0.5 02/03/2017   MICRALBCREAT 0.7 12/20/2015   + HL: Lab Results  Component Value Date   CHOL 151 11/06/2020   HDL 67.80 11/06/2020   LDLCALC 71 11/06/2020   TRIG 64.0 11/06/2020   CHOLHDL 2 11/06/2020  Not on a statin  Last eye exam fall 2022: reportedly No DR, IOP high - changed opthalmologists, - [facility].  + Numbness and tingling in hand and feet.  She feels much better on Cymbalta and Neurontin.  She had CP >> had a stress test 07/04/2015 >>  Normal.  She is on metoprolol for  tachycardia. She has fibromyalgia.   She also has a history of iron deficiency anemia and had iron infusions.   In 2019, she just got out of a 5-year relationship.  She was under a lot of stress.  She is on Cymbalta for anxiety.  Review of Systems + see HPI  Past Medical History:  Diagnosis Date   ADHD (attention deficit hyperactivity disorder)    ALLERGIC RHINITIS    Allergy    Anemia    ANXIETY    Depression    DIABETES MELLITUS, TYPE II    Fibromyalgia 2018   GERD (gastroesophageal reflux disease)    MIGRAINE, COMMON    Obesity, unspecified    OVARIAN CYST    Past Surgical History:  Procedure Laterality Date   NO PAST SURGERIES     Social  History   Socioeconomic History   Marital status: Single    Spouse name: Not on file   Number of children: Not on file   Years of education: Not on file   Highest education level: Not on file  Occupational History   Not on file  Tobacco Use   Smoking status: Former    Types: Cigarettes    Quit date: 08/20/2002    Years since quitting: 19.1   Smokeless tobacco: Never   Tobacco comments:    single, separated from spouse 02/2010.   Vaping Use   Vaping Use: Never used  Substance and Sexual Activity   Alcohol use: Yes    Comment: couple of drinks a week   Drug use: No   Sexual activity: Not Currently    Birth control/protection: I.U.D.  Other Topics Concern   Not on file  Social History Narrative   Single, separated from spouse 02/2010   Social Determinants of Health   Financial Resource Strain: Not on file  Food Insecurity: Not on file  Transportation Needs: Not on file  Physical Activity: Not on file  Stress: Not on file  Social Connections: Not on file  Intimate Partner Violence: Not on file   Current Outpatient Medications on File Prior to Visit  Medication Sig Dispense Refill   ALPRAZolam (XANAX) 0.5 MG tablet TAKE 1 TABLET(0.5 MG) BY MOUTH THREE TIMES DAILY AS NEEDED FOR ANXIETY 60 tablet 3   amphetamine-dextroamphetamine (ADDERALL XR) 20 MG 24 hr capsule Take 1 capsule (20 mg total) by mouth daily as needed (school or work days). 30 capsule 0   Continuous Blood Gluc Receiver (FREESTYLE LIBRE 2 READER) DEVI USE AS DIRECTED 1 each 0   Continuous Blood Gluc Sensor (FREESTYLE LIBRE 2 SENSOR) MISC APPLY EVERY 14 DAYS 2 each 5   DULoxetine (CYMBALTA) 30 MG capsule Take 1 capsule (30 mg total) by mouth daily. Take in addition to 60 mg cap for total of 90 mg daily 90 capsule 1   DULoxetine (CYMBALTA) 60 MG capsule TAKE 1 CAPSULE BY MOUTH EVERY DAY 90 capsule 1   EPINEPHrine 0.3 mg/0.3 mL IJ SOAJ injection      Fexofenadine HCl (ALLEGRA ALLERGY PO) Take by mouth.      gabapentin (NEURONTIN) 100 MG capsule Take 2 capsules (200 mg total) by mouth at bedtime. 180 capsule 1   glucose blood (ONETOUCH VERIO) test strip Use 2x a day 200 each 3   Insulin NPH, Human,, Isophane, (HUMULIN N KWIKPEN) 100 UNIT/ML Kiwkpen Inject 18-28 Units into the skin 2 (two) times daily. 30 mL 3   insulin regular (HUMULIN R) 100 units/mL injection Inject 0.08-0.1 mLs (8-10 Units  total) into the skin 3 (three) times daily before meals. 20 mL 3   Insulin Syringe-Needle U-100 (B-D INS SYR ULTRAFINE .5CC/30G) 30G X 1/2" 0.5 ML MISC Use to inject insulin once daily. 100 each 11   levonorgestrel (MIRENA) 20 MCG/24HR IUD 1 Intra Uterine Device (1 each total) by Intrauterine route once. 1 each 0   meloxicam (MOBIC) 15 MG tablet Take 1 tablet (15 mg total) by mouth daily. 30 tablet 0   metFORMIN (GLUCOPHAGE) 1000 MG tablet TAKE 2 TABLETS BY MOUTH DAILY WITH SUPPER. TAKE 1 TABLET BY MOUTH TWICE A DAY WITH A MEAL 90 tablet 5   metoprolol succinate (TOPROL-XL) 25 MG 24 hr tablet Take 1 tablet (25 mg total) by mouth daily. 90 tablet 1   omeprazole (PRILOSEC) 40 MG capsule TAKE 1 CAPULE BY MOUTH BEFORE SUPPER DAILY 90 capsule 1   ondansetron (ZOFRAN ODT) 4 MG disintegrating tablet Take 1 tablet (4 mg total) by mouth every 8 (eight) hours as needed for nausea or vomiting. 20 tablet 0   promethazine (PHENERGAN) 25 MG tablet Take 1 tablet (25 mg total) by mouth every 6 (six) hours as needed for nausea. 15 tablet 0   rizatriptan (MAXALT) 10 MG tablet Take 1 tablet (10 mg total) by mouth as needed for migraine. May repeat in 2 hours if needed 10 tablet 8   rosuvastatin (CRESTOR) 5 MG tablet TAKE 1 TABLET(5 MG TOTAL) BY MOUTH 2(TWO) TIMES A WEEK 13 tablet 3   Semaglutide,0.25 or 0.5MG /DOS, (OZEMPIC, 0.25 OR 0.5 MG/DOSE,) 2 MG/1.5ML SOPN Inject 0.5 mg into the skin once a week. 1.5 mL 3   SYRINGE-NEEDLE, DISP, 3 ML 25G X 1" 3 ML MISC Use for monthly B12 injections 12 each 0   triamcinolone cream (KENALOG) 0.1 %  Apply 1 application topically 2 (two) times daily as needed (for itching). 28.4 g 1   VITAMIN B1-B12 IM Inject into the muscle.     No current facility-administered medications on file prior to visit.   Allergies  Allergen Reactions   Chicken Allergy Anaphylaxis and Shortness Of Breath   Covid-19 Mrna Vacc (Moderna) Anaphylaxis   Other Itching, Swelling and Other (See Comments)    NUTS (of ANY kind): Lips and mouth swell, but no breathing impairment & migraines and eczema are triggered   Propofol Shortness Of Breath    SOB, chest tightness, wheezing after colonoscopy on 09-18-15   Basaglar Kwikpen [Insulin Glargine] Palpitations and Hypertension   Adhesive [Tape] Other (See Comments)    Makes the skin VERY RED    Gluten Meal Diarrhea   Peanut-Containing Drug Products Itching, Swelling and Other (See Comments)    Lips and mouth swell, but no breathing impairment & migraines and eczema are triggered    Novolog [Insulin Aspart] Rash   Tyler Aas [Insulin Degludec] Rash    eczema   Victoza [Liraglutide] Rash   Family History  Problem Relation Age of Onset   Diabetes Mother    Hyperlipidemia Mother    CAD Mother    CVA Mother    Diabetes Father    Hyperlipidemia Father    Coronary artery disease Father    Hypertension Father    Cancer Father        esoph   Stroke Father    Cancer Maternal Grandfather 82       Prostate and Lung   Diabetes Sister        Gestational   Graves' disease Maternal Aunt    Cancer Maternal Uncle  Brain   Stroke Paternal Grandmother    Heart disease Paternal Grandmother    Stroke Paternal Grandfather    Heart disease Paternal Grandfather    Diabetes Sister        Gestational and Type II   Hashimoto's thyroiditis Cousin    Cancer Maternal Uncle        Brain       Objective:   Physical Exam BP 128/88 (BP Location: Right Arm, Patient Position: Sitting, Cuff Size: Normal)    Pulse (!) 128    Ht 5\' 4"  (1.626 m)    Wt 209 lb 9.6 oz (95.1 kg)     SpO2 99%    BMI 35.98 kg/m   Wt Readings from Last 3 Encounters:  10/08/21 209 lb 9.6 oz (95.1 kg)  07/01/21 231 lb 12.8 oz (105.1 kg)  05/10/21 234 lb (106.1 kg)   Constitutional: overweight, in NAD Eyes: PERRLA, EOMI, no exophthalmos ENT: moist mucous membranes, no thyromegaly, no cervical lymphadenopathy Cardiovascular: Tachycardia, RR, No MRG Respiratory: CTA B Gastrointestinal: abdomen soft, NT, ND, BS+ Musculoskeletal: no deformities, strength intact in all 4 Skin: moist, warm, no rashes Neurological: no tremor with outstretched hands, DTR normal in all 4 Diabetic Foot Exam - Simple   Simple Foot Form Diabetic Foot exam was performed with the following findings: Yes 10/08/2021  3:20 PM  Visual Inspection No deformities, no ulcerations, no other skin breakdown bilaterally: Yes Sensation Testing Intact to touch and monofilament testing bilaterally: Yes Pulse Check Posterior Tibialis and Dorsalis pulse intact bilaterally: Yes Comments     Assessment:     1. DM2, insulin-dependent, uncontrolled, without complications - r/o autoimmunity Component     Latest Ref Rng 08/17/2012  Glutamic Acid Decarb Ab     <=1.0 U/mL <1.0  Pancreatic Islet Cell Antibody     < 5 JDF Units <5  C-Peptide     0.80 - 3.90 ng/mL 1.84  Glucose     70 - 99 mg/dL 104 (H)     2. Obesity class II BMI Classification: < 18.5 underweight  18.5-24.9 normal weight  25.0-29.9 overweight  30.0-34.9 class I obesity  35.0-39.9 class II obesity  ? 40.0 class III obesity   3. HL  Plan:     1. Patient with longstanding, uncontrolled, type 2 diabetes, on metformin, and basal bolus insulin regimen at last visit, to which we added a weekly GLP-1 receptor agonist. -Sugars improved after we switched from NovoLog to regular insulin due to rash.  She also has other intolerances to analog insulins so we had her on NPH instead.  Sugars improved further after also starting the freestyle libre CGM.  She also  worked with nutrition in the past.  At last visit, sugars were better, excellent after lunch but quite fluctuating in the second half of the night and in the morning.  We added Ozempic at that time per her request.  I advised her to start at a low dose and increase as tolerated.  She was finally able to start approximately 2 months ago on the low dose.  She was quite symptomatic with nausea, constipation, and increased gas, for which she was taking Tums and GasX.  The symptoms started to improve but then exacerbated again after she increased the dose.  She also has generalized muscle and joint aches, unclear if from [place].  Sugars started to improve since last visit to the point of lows so she had to back off the insulin drastically. CGM interpretation: -At  today's visit, we reviewed her CGM downloads: It appears that 95% of values are in target range (goal >70%), while 3% are higher than 180 (goal <25%), and 2% are lower than 70 (goal <4%).  The calculated average blood sugar is 127.  The projected HbA1c for the next 3 months (GMI) is 6.4%. -Reviewing the CGM trends, sugars are within target range throughout the majority of the day with only some lower blood sugars around 1 AM and 4 PM.  As of now, she is taking NPH 10 to 12 units either in the morning or at night, rarely twice a day.  However, she takes regular insulin between 3 to 6 units before meals.  She refused her meals as she is not hungry so she has very few carbs per day.  I advised her to back off the dose of Ozempic due to her symptoms to 0.25 mg weekly.  If the symptoms persist after decreasing the dose, we may need to stop it.  If she feels well on it, she can consider increasing the dose further by alternating the 0.25 mg with 0.5 mg every other week.  As of now, I advised her to stop regular insulin on a daily basis but may needed with larger meals.  I advised her to take NPH twice a day, but the low dose, 10 units. - I suggested to:  Patient  Instructions  Please continue: - Metformin 1000 mg 2x a day with meals  Please decrease: - Ozempic 0.25 mg weekly  Use: - NPH 10 units 2x a day  Try to stay off: - Regular insulin (maybe use 4-6 units before a larger meal)  Please return in 3-4 months.  - we checked her HbA1c: 6.9% (best she had in a long time!) - advised to check sugars at different times of the day - 4x a day, rotating check times - advised for yearly eye exams >> she is UTD - Foot exam performed today - return to clinic in 3-4 months    2. Obesity class II -At last visit, we restarted Ozempic >> she is having a hard time tolerating it but is determined to continue with it.  We will decrease the dose at today's visit for better tolerance -Before last visit, she lost 7 pounds -At this visit, she lost 22 pounds since last visit!!  3. HL -Reviewed latest lipid panel from 10/2020: Fractions at goal: Lab Results  Component Value Date   CHOL 151 11/06/2020   HDL 67.80 11/06/2020   LDLCALC 71 11/06/2020   TRIG 64.0 11/06/2020   CHOLHDL 2 11/06/2020  -She is not on a statin -She has an appointment with PCP coming up  Philemon Kingdom, MD PhD [facility]

## 2021-10-08 NOTE — Patient Instructions (Addendum)
Please continue: - Metformin 1000 mg 2x a day with meals  Please decrease: - Ozempic 0.25 mg weekly  Use: - NPH 10 units 2x a day  Try to stay off: - Regular insulin (maybe use 4-6 units before a larger meal)  Please return in 3-4 months.

## 2021-10-10 ENCOUNTER — Other Ambulatory Visit: Payer: Self-pay | Admitting: Internal Medicine

## 2021-10-10 MED ORDER — AMPHETAMINE-DEXTROAMPHET ER 20 MG PO CP24: 20.0000 mg | ORAL_CAPSULE | Freq: Every day | ORAL | 0 refills | Status: DC | PRN

## 2021-10-23 ENCOUNTER — Other Ambulatory Visit: Payer: Self-pay

## 2021-10-23 NOTE — Telephone Encounter (Signed)
Inbound fax from [facility] requesting a refill for Humalin R. ?Voicemail left for pt to confirm request. Mychart message sent as well ?

## 2021-11-08 ENCOUNTER — Ambulatory Visit: Payer: No Typology Code available for payment source | Admitting: Internal Medicine

## 2021-11-12 ENCOUNTER — Encounter: Payer: Self-pay | Admitting: Internal Medicine

## 2021-11-12 ENCOUNTER — Other Ambulatory Visit: Payer: Self-pay | Admitting: Internal Medicine

## 2021-11-12 DIAGNOSIS — R109 Unspecified abdominal pain: Secondary | ICD-10-CM

## 2021-11-13 ENCOUNTER — Other Ambulatory Visit (INDEPENDENT_AMBULATORY_CARE_PROVIDER_SITE_OTHER): Payer: No Typology Code available for payment source

## 2021-11-13 DIAGNOSIS — R109 Unspecified abdominal pain: Secondary | ICD-10-CM

## 2021-11-13 LAB — URINALYSIS, ROUTINE W REFLEX MICROSCOPIC
Bilirubin Urine: NEGATIVE
Hgb urine dipstick: NEGATIVE
Ketones, ur: NEGATIVE
Leukocytes,Ua: NEGATIVE
Nitrite: NEGATIVE
RBC / HPF: NONE SEEN (ref 0–?)
Specific Gravity, Urine: 1.015 (ref 1.000–1.030)
Total Protein, Urine: NEGATIVE
Urine Glucose: NEGATIVE
Urobilinogen, UA: 0.2 (ref 0.0–1.0)
pH: 7.5 (ref 5.0–8.0)

## 2021-11-14 LAB — URINE CULTURE

## 2021-11-15 ENCOUNTER — Encounter: Payer: Self-pay | Admitting: Internal Medicine

## 2021-11-17 ENCOUNTER — Encounter: Payer: Self-pay | Admitting: Internal Medicine

## 2021-11-17 NOTE — Progress Notes (Signed)
? ? ? ? ?Subjective:  ? ? Patient ID: Lisa Duran, female    DOB: 1981/02/23, 41 y.o.   MRN: [id number] ? ?This visit occurred during the SARS-CoV-2 public health emergency.  Safety protocols were in place, including screening questions prior to the visit, additional usage of staff PPE, and extensive cleaning of exam room while observing appropriate contact time as indicated for disinfecting solutions.   ? ? ?HPI ?Baby is here for follow up of her chronic medical problems, including DM, hld, migraines, fibromyalgia, GERD, anemia, ADD, depression, anxiety, obesity, B12 def ? ?She is taking her medications daily as prescribed. ? ? ?Weaned herself off gabapentin and does not feel like she needed it.   She also dec'd the cymbalta to 60 mg only.  She is doing well with both of these changes.   ? ?Has increased her walking.  Energy is better.   ? ?Medications and allergies reviewed with patient and updated if appropriate. ? ?Current Outpatient Medications on File Prior to Visit  ?Medication Sig Dispense Refill  ? ALPRAZolam (XANAX) 0.5 MG tablet TAKE 1 TABLET(0.5 MG) BY MOUTH THREE TIMES DAILY AS NEEDED FOR ANXIETY 60 tablet 3  ? amphetamine-dextroamphetamine (ADDERALL XR) 20 MG 24 hr capsule Take 1 capsule (20 mg total) by mouth daily as needed (school or work days). 30 capsule 0  ? DULoxetine (CYMBALTA) 60 MG capsule TAKE 1 CAPSULE BY MOUTH EVERY DAY 90 capsule 1  ? EPINEPHrine 0.3 mg/0.3 mL IJ SOAJ injection     ? Fexofenadine HCl (ALLEGRA ALLERGY PO) Take by mouth.    ? glucose blood (ONETOUCH VERIO) test strip Use 2x a day 200 each 3  ? Insulin NPH, Human,, Isophane, (HUMULIN N KWIKPEN) 100 UNIT/ML Kiwkpen Inject 10-12 Units into the skin 2 (two) times daily. 30 mL 3  ? Insulin Syringe-Needle U-100 (B-D INS SYR ULTRAFINE .5CC/30G) 30G X 1/2" 0.5 ML MISC Use to inject insulin once daily. 100 each 11  ? levonorgestrel (MIRENA) 20 MCG/24HR IUD 1 Intra Uterine Device (1 each total) by Intrauterine route once. 1 each 0  ?  meloxicam (MOBIC) 15 MG tablet Take 1 tablet (15 mg total) by mouth daily. 30 tablet 0  ? metFORMIN (GLUCOPHAGE) 1000 MG tablet TAKE 2 TABLETS BY MOUTH DAILY WITH SUPPER. TAKE 1 TABLET BY MOUTH TWICE A DAY WITH A MEAL 90 tablet 5  ? metoprolol succinate (TOPROL-XL) 25 MG 24 hr tablet Take 1 tablet (25 mg total) by mouth daily. 90 tablet 1  ? omeprazole (PRILOSEC) 40 MG capsule TAKE 1 CAPULE BY MOUTH BEFORE SUPPER DAILY 90 capsule 1  ? ondansetron (ZOFRAN ODT) 4 MG disintegrating tablet Take 1 tablet (4 mg total) by mouth every 8 (eight) hours as needed for nausea or vomiting. 20 tablet 0  ? promethazine (PHENERGAN) 25 MG tablet Take 1 tablet (25 mg total) by mouth every 6 (six) hours as needed for nausea. 15 tablet 0  ? rizatriptan (MAXALT) 10 MG tablet Take 1 tablet (10 mg total) by mouth as needed for migraine. May repeat in 2 hours if needed 10 tablet 8  ? rosuvastatin (CRESTOR) 5 MG tablet TAKE 1 TABLET(5 MG TOTAL) BY MOUTH 2(TWO) TIMES A WEEK 13 tablet 3  ? Semaglutide,0.25 or 0.'5MG'$ /DOS, (OZEMPIC, 0.25 OR 0.5 MG/DOSE,) 2 MG/1.5ML SOPN Inject 0.5 mg into the skin once a week. 1.5 mL 3  ? SYRINGE-NEEDLE, DISP, 3 ML 25G X 1" 3 ML MISC Use for monthly B12 injections 12 each 0  ? triamcinolone  cream (KENALOG) 0.1 % Apply 1 application topically 2 (two) times daily as needed (for itching). 28.4 g 1  ? VITAMIN B1-B12 IM Inject into the muscle.    ? Continuous Blood Gluc Receiver (FREESTYLE LIBRE 2 READER) DEVI USE AS DIRECTED (Patient not taking: Reported on 11/18/2021) 1 each 0  ? Continuous Blood Gluc Sensor (FREESTYLE LIBRE 2 SENSOR) MISC APPLY EVERY 14 DAYS (Patient not taking: Reported on 11/18/2021) 2 each 5  ? ?No current facility-administered medications on file prior to visit.  ? ? ? ?Review of Systems  ?Constitutional:  Negative for chills and fever.  ?Respiratory:  Negative for cough, shortness of breath and wheezing.   ?Cardiovascular:  Negative for chest pain, palpitations and leg swelling.  ?Neurological:   Negative for light-headedness and headaches.  ? ?   ?Objective:  ? ?Vitals:  ? 11/18/21 0845  ?BP: 110/84  ?Pulse: (!) 114  ?Temp: 97.8 ?F (36.6 ?C)  ?SpO2: 97%  ? ?BP Readings from Last 3 Encounters:  ?11/18/21 110/84  ?10/08/21 128/88  ?07/01/21 140/90  ? ?Wt Readings from Last 3 Encounters:  ?11/18/21 201 lb 2 oz (91.2 kg)  ?10/08/21 209 lb 9.6 oz (95.1 kg)  ?07/01/21 231 lb 12.8 oz (105.1 kg)  ? ?Body mass index is 34.52 kg/m?. ? ?  ?Physical Exam ?Constitutional:   ?   General: She is not in acute distress. ?   Appearance: Normal appearance.  ?HENT:  ?   Head: Normocephalic and atraumatic.  ?Eyes:  ?   Conjunctiva/sclera: Conjunctivae normal.  ?Cardiovascular:  ?   Rate and Rhythm: Normal rate and regular rhythm.  ?   Heart sounds: Normal heart sounds. No murmur heard. ?Pulmonary:  ?   Effort: Pulmonary effort is normal. No respiratory distress.  ?   Breath sounds: Normal breath sounds. No wheezing.  ?Musculoskeletal:  ?   Cervical back: Neck supple.  ?   Right lower leg: No edema.  ?   Left lower leg: No edema.  ?Lymphadenopathy:  ?   Cervical: No cervical adenopathy.  ?Skin: ?   Findings: No rash.  ?Neurological:  ?   Mental Status: She is alert. Mental status is at baseline.  ?Psychiatric:     ?   Mood and Affect: Mood normal.     ?   Behavior: Behavior normal.  ? ?   ? ?Lab Results  ?Component Value Date  ? WBC 9.8 05/10/2021  ? HGB 12.9 05/10/2021  ? HCT 40.6 05/10/2021  ? PLT 389.0 05/10/2021  ? GLUCOSE 195 (H) 05/10/2021  ? CHOL 151 11/06/2020  ? TRIG 64.0 11/06/2020  ? HDL 67.80 11/06/2020  ? [place] 71 11/06/2020  ? ALT 18 05/10/2021  ? AST 13 05/10/2021  ? NA 136 05/10/2021  ? K 3.8 05/10/2021  ? CL 101 05/10/2021  ? CREATININE 0.67 05/10/2021  ? BUN 10 05/10/2021  ? CO2 25 05/10/2021  ? TSH 4.08 11/06/2020  ? INR 1.1 01/11/2019  ? HGBA1C 6.9 (A) 10/08/2021  ? MICROALBUR <0.7 05/10/2021  ? ? ? ?Assessment & Plan:  ? ? ?See Problem List for Assessment and Plan of chronic medical problems.  ? ? ?

## 2021-11-17 NOTE — Patient Instructions (Addendum)
? ? ? ?  Blood work was ordered.   ? ? ?Medications changes include :   adderall 20 mg twice daily ? ? ?Your prescription(s) have been sent to your pharmacy.  ? ? ? ? ?Return in about 6 months (around 05/20/2022) for CPE. ? ?

## 2021-11-18 ENCOUNTER — Ambulatory Visit (INDEPENDENT_AMBULATORY_CARE_PROVIDER_SITE_OTHER): Payer: No Typology Code available for payment source | Admitting: Internal Medicine

## 2021-11-18 VITALS — BP 110/84 | HR 114 | Temp 97.8°F | Ht 64.0 in | Wt 201.1 lb

## 2021-11-18 DIAGNOSIS — E538 Deficiency of other specified B group vitamins: Secondary | ICD-10-CM

## 2021-11-18 DIAGNOSIS — K219 Gastro-esophageal reflux disease without esophagitis: Secondary | ICD-10-CM

## 2021-11-18 DIAGNOSIS — D508 Other iron deficiency anemias: Secondary | ICD-10-CM | POA: Diagnosis not present

## 2021-11-18 DIAGNOSIS — E785 Hyperlipidemia, unspecified: Secondary | ICD-10-CM

## 2021-11-18 DIAGNOSIS — F3289 Other specified depressive episodes: Secondary | ICD-10-CM

## 2021-11-18 DIAGNOSIS — E1149 Type 2 diabetes mellitus with other diabetic neurological complication: Secondary | ICD-10-CM | POA: Diagnosis not present

## 2021-11-18 DIAGNOSIS — M797 Fibromyalgia: Secondary | ICD-10-CM

## 2021-11-18 DIAGNOSIS — Z6839 Body mass index (BMI) 39.0-39.9, adult: Secondary | ICD-10-CM

## 2021-11-18 DIAGNOSIS — F419 Anxiety disorder, unspecified: Secondary | ICD-10-CM | POA: Diagnosis not present

## 2021-11-18 DIAGNOSIS — G43009 Migraine without aura, not intractable, without status migrainosus: Secondary | ICD-10-CM | POA: Diagnosis not present

## 2021-11-18 DIAGNOSIS — F900 Attention-deficit hyperactivity disorder, predominantly inattentive type: Secondary | ICD-10-CM

## 2021-11-18 LAB — CBC WITH DIFFERENTIAL/PLATELET
Basophils Absolute: 0.1 10*3/uL (ref 0.0–0.1)
Basophils Relative: 0.7 % (ref 0.0–3.0)
Eosinophils Absolute: 1 10*3/uL — ABNORMAL HIGH (ref 0.0–0.7)
Eosinophils Relative: 10.2 % — ABNORMAL HIGH (ref 0.0–5.0)
HCT: 43.1 % (ref 36.0–46.0)
Hemoglobin: 13.7 g/dL (ref 12.0–15.0)
Lymphocytes Relative: 19.7 % (ref 12.0–46.0)
Lymphs Abs: 1.9 10*3/uL (ref 0.7–4.0)
MCHC: 31.9 g/dL (ref 30.0–36.0)
MCV: 77.2 fl — ABNORMAL LOW (ref 78.0–100.0)
Monocytes Absolute: 0.6 10*3/uL (ref 0.1–1.0)
Monocytes Relative: 6.6 % (ref 3.0–12.0)
Neutro Abs: 6.2 10*3/uL (ref 1.4–7.7)
Neutrophils Relative %: 62.8 % (ref 43.0–77.0)
Platelets: 441 10*3/uL — ABNORMAL HIGH (ref 150.0–400.0)
RBC: 5.58 Mil/uL — ABNORMAL HIGH (ref 3.87–5.11)
RDW: 18.2 % — ABNORMAL HIGH (ref 11.5–15.5)
WBC: 9.8 10*3/uL (ref 4.0–10.5)

## 2021-11-18 LAB — COMPREHENSIVE METABOLIC PANEL
ALT: 15 U/L (ref 0–35)
AST: 18 U/L (ref 0–37)
Albumin: 4.6 g/dL (ref 3.5–5.2)
Alkaline Phosphatase: 109 U/L (ref 39–117)
BUN: 9 mg/dL (ref 6–23)
CO2: 26 mEq/L (ref 19–32)
Calcium: 9.8 mg/dL (ref 8.4–10.5)
Chloride: 100 mEq/L (ref 96–112)
Creatinine, Ser: 0.72 mg/dL (ref 0.40–1.20)
GFR: 102.62 mL/min (ref >60.00)
Glucose, Bld: 106 mg/dL — ABNORMAL HIGH (ref 70–99)
Potassium: 4.2 mEq/L (ref 3.5–5.1)
Sodium: 138 mEq/L (ref 135–145)
Total Bilirubin: 0.4 mg/dL (ref 0.2–1.2)
Total Protein: 8.1 g/dL (ref 6.0–8.3)

## 2021-11-18 LAB — LIPID PANEL
Cholesterol: 147 mg/dL (ref 0–200)
HDL: 62.1 mg/dL (ref >39.00)
LDL Cholesterol: 70 mg/dL (ref 0–99)
NonHDL: 84.76
Total CHOL/HDL Ratio: 2
Triglycerides: 76 mg/dL (ref 0.0–149.0)
VLDL: 15.2 mg/dL (ref 0.0–40.0)

## 2021-11-18 LAB — IBC PANEL
Iron: 42 ug/dL (ref 42–145)
Saturation Ratios: 10.4 % — ABNORMAL LOW (ref 20.0–50.0)
TIBC: 403.2 ug/dL (ref 250.0–450.0)
Transferrin: 288 mg/dL (ref 212.0–360.0)

## 2021-11-18 LAB — VITAMIN B12: Vitamin B-12: 376 pg/mL (ref 211–911)

## 2021-11-18 LAB — FERRITIN: Ferritin: 30.7 ng/mL (ref 10.0–291.0)

## 2021-11-18 MED ORDER — AMPHETAMINE-DEXTROAMPHETAMINE 20 MG PO TABS: 20.0000 mg | ORAL_TABLET | Freq: Two times a day (BID) | ORAL | 0 refills | Status: DC

## 2021-11-18 MED ORDER — DULOXETINE HCL 60 MG PO CPEP: 60.0000 mg | ORAL_CAPSULE | Freq: Every day | ORAL | 2 refills | Status: DC

## 2021-11-18 MED ORDER — CYANOCOBALAMIN 1000 MCG/ML IJ SOLN: 1000.0000 ug | Freq: Once | INTRAMUSCULAR | 0 refills | Status: AC

## 2021-11-18 MED ORDER — METOPROLOL SUCCINATE ER 25 MG PO TB24: 25.0000 mg | ORAL_TABLET | Freq: Every day | ORAL | 1 refills | Status: DC

## 2021-11-18 NOTE — Assessment & Plan Note (Addendum)
Chronic ?Well-controlled ?Weaned off gabapentin - no longer needs it, decreased to  cymbalta 60 mg daily ?Continue Cymbalta 60 mg daily ?Encouraged regular exercise ?

## 2021-11-18 NOTE — Assessment & Plan Note (Addendum)
Chronic ?Controlled, Stable ?Continue Cymbalta 60 mg daily, Xanax 0.5 mg 3 times daily as needed ?

## 2021-11-18 NOTE — Assessment & Plan Note (Signed)
Chronic Regular exercise and healthy diet encouraged Check lipid panel  Continue Crestor 5 mg twice weekly 

## 2021-11-18 NOTE — Assessment & Plan Note (Addendum)
Chronic ?Adderall does not seem as effective since starting Ozempic-May be not getting absorbed as well, especially being an extended release ?No side effects ?Continue Adderall med change to 20 mg short acting formula twice daily ?

## 2021-11-18 NOTE — Assessment & Plan Note (Addendum)
Chronic ?Controlled, Stable ?Continue Cymbalta 60 mg daily ?

## 2021-11-18 NOTE — Assessment & Plan Note (Addendum)
Chronic ?Losing weight with Ozempic and lifestyle ?Encouraged healthy lifestyle-regular exercise, healthy diet-diabetic diet, decrease portions ?

## 2021-11-18 NOTE — Assessment & Plan Note (Signed)
Chronic ?Overall controlled ?Continue axert 12.5 mg daily as needed or OTC Excedrin Migraine ?

## 2021-11-18 NOTE — Assessment & Plan Note (Addendum)
Chronic ?GERD not controlled ?Continue omeprazole 40 mg daily ?Taking mylanta as needed ?Will start OTC pepcid 20 mg daily ?

## 2021-11-18 NOTE — Assessment & Plan Note (Signed)
Chronic ?Has needed intermittent iron infusions ?CBC, iron panel ?

## 2021-11-18 NOTE — Assessment & Plan Note (Signed)
Chronic ?Management per Dr. Cruzita Lederer ? ?Lab Results  ?Component Value Date  ? HGBA1C 6.9 (A) 10/08/2021  ? ? ?

## 2021-11-18 NOTE — Assessment & Plan Note (Signed)
Chronic ?Doing B12 injections monthly ?Check B12 level ?

## 2021-11-21 ENCOUNTER — Other Ambulatory Visit: Payer: Self-pay | Admitting: Internal Medicine

## 2021-11-21 ENCOUNTER — Encounter: Payer: Self-pay | Admitting: Internal Medicine

## 2021-12-17 ENCOUNTER — Encounter: Payer: Self-pay | Admitting: Internal Medicine

## 2021-12-18 ENCOUNTER — Other Ambulatory Visit: Payer: Self-pay | Admitting: Internal Medicine

## 2022-01-07 NOTE — Progress Notes (Unsigned)
Benito Mccreedy D.[place] Phone: [phone]   Assessment and Plan:     There are no diagnoses linked to this encounter.  ***   Pertinent previous records reviewed include ***   Follow Up: ***     Subjective:   I, Marqus Macphee, am serving as a Education administrator for Doctor Glennon Mac  Chief Complaint: left knee pain   HPI:   01/08/2022 Patient is a 41 year old female complaining of left knee pain. Patient states  Relevant Historical Information: ***  Additional pertinent review of systems negative.   Current Outpatient Medications:    ALPRAZolam (XANAX) 0.5 MG tablet, TAKE 1 TABLET(0.5 MG) BY MOUTH THREE TIMES DAILY AS NEEDED FOR ANXIETY, Disp: 60 tablet, Rfl: 5   amphetamine-dextroamphetamine (ADDERALL) 20 MG tablet, Take 1 tablet (20 mg total) by mouth 2 (two) times daily., Disp: 60 tablet, Rfl: 0   Continuous Blood Gluc Receiver ([place]) DEVI, by Does not apply route., Disp: , Rfl:    Continuous Blood Gluc Sensor (DEXCOM G6 SENSOR) MISC, by Does not apply route., Disp: , Rfl:    Continuous Blood Gluc Transmit (DEXCOM G6 TRANSMITTER) MISC, by Does not apply route., Disp: , Rfl:    DULoxetine (CYMBALTA) 60 MG capsule, Take 1 capsule (60 mg total) by mouth daily., Disp: 90 capsule, Rfl: 2   EPINEPHrine 0.3 mg/0.3 mL IJ SOAJ injection, , Disp: , Rfl:    Fexofenadine HCl (ALLEGRA ALLERGY PO), Take by mouth., Disp: , Rfl:    glucose blood (ONETOUCH VERIO) test strip, Use 2x a day, Disp: 200 each, Rfl: 3   Insulin NPH, Human,, Isophane, (HUMULIN N KWIKPEN) 100 UNIT/ML Kiwkpen, Inject 10-12 Units into the skin 2 (two) times daily., Disp: 30 mL, Rfl: 3   Insulin Syringe-Needle U-100 (B-D INS SYR ULTRAFINE .5CC/30G) 30G X 1/2" 0.5 ML MISC, Use to inject insulin once daily., Disp: 100 each, Rfl: 11   levonorgestrel (MIRENA) 20 MCG/24HR IUD, 1 Intra Uterine Device (1 each total) by Intrauterine  route once., Disp: 1 each, Rfl: 0   meloxicam (MOBIC) 15 MG tablet, Take 1 tablet (15 mg total) by mouth daily., Disp: 30 tablet, Rfl: 0   metFORMIN (GLUCOPHAGE) 1000 MG tablet, TAKE 2 TABLETS BY MOUTH DAILY WITH SUPPER. TAKE 1 TABLET BY MOUTH TWICE A DAY WITH A MEAL, Disp: 90 tablet, Rfl: 5   metoprolol succinate (TOPROL-XL) 25 MG 24 hr tablet, Take 1 tablet (25 mg total) by mouth daily., Disp: 90 tablet, Rfl: 1   omeprazole (PRILOSEC) 40 MG capsule, TAKE 1 CAPULE BY MOUTH BEFORE SUPPER DAILY, Disp: 90 capsule, Rfl: 1   ondansetron (ZOFRAN ODT) 4 MG disintegrating tablet, Take 1 tablet (4 mg total) by mouth every 8 (eight) hours as needed for nausea or vomiting., Disp: 20 tablet, Rfl: 0   OZEMPIC, 0.25 OR 0.5 MG/DOSE, 2 MG/1.5ML SOPN, INJECT 0.'5MG'$  INTO THE SKIN ONCE A WEEK, Disp: 1.5 mL, Rfl: 3   promethazine (PHENERGAN) 25 MG tablet, Take 1 tablet (25 mg total) by mouth every 6 (six) hours as needed for nausea., Disp: 15 tablet, Rfl: 0   rizatriptan (MAXALT) 10 MG tablet, Take 1 tablet (10 mg total) by mouth as needed for migraine. May repeat in 2 hours if needed, Disp: 10 tablet, Rfl: 8   rosuvastatin (CRESTOR) 5 MG tablet, TAKE 1 TABLET(5 MG TOTAL) BY MOUTH 2(TWO) TIMES A WEEK, Disp: 13 tablet, Rfl: 3   SYRINGE-NEEDLE, DISP,  3 ML 25G X 1" 3 ML MISC, Use for monthly B12 injections, Disp: 12 each, Rfl: 0   triamcinolone cream (KENALOG) 0.1 %, Apply 1 application topically 2 (two) times daily as needed (for itching)., Disp: 28.4 g, Rfl: 1   VITAMIN B1-B12 IM, Inject into the muscle., Disp: , Rfl:    Objective:     There were no vitals filed for this visit.    There is no height or weight on file to calculate BMI.    Physical Exam:    ***   Electronically signed by:  Benito Mccreedy D.[facility] 8:04 AM 01/07/22

## 2022-01-08 ENCOUNTER — Ambulatory Visit: Payer: No Typology Code available for payment source | Admitting: Sports Medicine

## 2022-01-08 ENCOUNTER — Ambulatory Visit (INDEPENDENT_AMBULATORY_CARE_PROVIDER_SITE_OTHER): Payer: No Typology Code available for payment source

## 2022-01-08 VITALS — BP 118/80 | HR 120 | Ht 64.0 in | Wt 201.0 lb

## 2022-01-08 DIAGNOSIS — M25562 Pain in left knee: Secondary | ICD-10-CM

## 2022-01-08 DIAGNOSIS — G8929 Other chronic pain: Secondary | ICD-10-CM | POA: Diagnosis not present

## 2022-01-08 DIAGNOSIS — M1712 Unilateral primary osteoarthritis, left knee: Secondary | ICD-10-CM

## 2022-01-08 NOTE — Patient Instructions (Addendum)
Good to see you Knee HEP  As needed follow up  

## 2022-01-09 ENCOUNTER — Other Ambulatory Visit: Payer: Self-pay | Admitting: Internal Medicine

## 2022-01-09 MED ORDER — AMPHETAMINE-DEXTROAMPHETAMINE 20 MG PO TABS: 20.0000 mg | ORAL_TABLET | Freq: Two times a day (BID) | ORAL | 0 refills | Status: DC

## 2022-02-20 ENCOUNTER — Encounter: Payer: Self-pay | Admitting: Internal Medicine

## 2022-02-20 ENCOUNTER — Ambulatory Visit (INDEPENDENT_AMBULATORY_CARE_PROVIDER_SITE_OTHER): Payer: No Typology Code available for payment source | Admitting: Internal Medicine

## 2022-02-20 VITALS — BP 120/88 | HR 86 | Ht 64.0 in | Wt 190.2 lb

## 2022-02-20 DIAGNOSIS — E785 Hyperlipidemia, unspecified: Secondary | ICD-10-CM

## 2022-02-20 DIAGNOSIS — Z6839 Body mass index (BMI) 39.0-39.9, adult: Secondary | ICD-10-CM | POA: Diagnosis not present

## 2022-02-20 DIAGNOSIS — E1149 Type 2 diabetes mellitus with other diabetic neurological complication: Secondary | ICD-10-CM | POA: Diagnosis not present

## 2022-02-20 LAB — POCT GLYCOSYLATED HEMOGLOBIN (HGB A1C): Hemoglobin A1C: 7 % — AB (ref 4.0–5.6)

## 2022-02-20 MED ORDER — METFORMIN HCL 1000 MG PO TABS: ORAL_TABLET | ORAL | 3 refills | Status: DC

## 2022-02-20 NOTE — Progress Notes (Signed)
=ubjective:     Patient ID: Lisa Duran, female   DOB: 1980/09/24, 41 y.o.   MRN: [id number]  HPI Ms Lisa Duran is a pleasant 41 y.o.  woman returning for f/u for DM2, insulin dependent, uncontrolled, dx 32/9924, w/o complications. Last visit 5 months ago.  Interim history: No increased urination, blurry vision, nausea, chest pain. After starting Ozempic, she had constipation, gas, nausea, but these improved. Now on Pepcid and Mylanta 2x a week. She continues to have joint and muscle aches. Stopped Tums as GERD improved. She lost 19 more pounds since last visit.  Since starting Ozempic, she feels that she lost almost 50 pounds.  She feels much better  Reviewed HbA1c levels: Lab Results  Component Value Date   HGBA1C 6.9 (A) 10/08/2021   HGBA1C 7.6 (A) 07/01/2021   HGBA1C 7.5 (A) 02/21/2021   HGBA1C 7.6 (A) 10/16/2020   HGBA1C 8.6 (A) 06/12/2020   HGBA1C 8.5 (A) 11/07/2019   HGBA1C 8.4 (A) 08/09/2019   HGBA1C 9.1 (A) 04/14/2019   HGBA1C 9.0 (H) 10/20/2018   HGBA1C 7.2 (A) 05/27/2018   She is on:   - Metformin 1000 mg 2x a day  - NPH 26-28 units and 22-24 units at bedtime >> 0-12 units 2x a day >> 10 >> 12 units 2x a day - Regular  8 units before B and L and 8-12 units before D >> 3-6 1-2x a day >> 4-6 units before large meals only - rarely - Ozempic 0.25 >> 0.5 mg weekly - started 07/19/2021 >> 0.25 >> 0.5 mg weekly due to muscle and joint aches We stopped glipizide ER and also Januvia. We tried Cycloset 0.8 mg daily >> stopped end of 11/2016 b/c GERD/AP/C We tried Trulicity >> could not tolerate it: N/V/AP. She restarted Januvia 09/2016. She was on Invokana 100 mg in am (started 12/2014) >> 2 yeast inf >> tx with Diflucan; she was exhausted and had nocturia >> stopped 03/12/2015 Tried Victoza 2 mo ago >> AP, severe GERD, inj site rxn Tried Januvia >> tolerated it well, but stopped when Invokana was suggested.  She was also on Actos, but taken off b/c good control. She had problems  tolerating insulins in the past.  She was previously on Lantus and NovoLog.  Basaglar and [place] caused eczema and also GI symptoms.  She has a [employer], structural.  She checks her sugars more than 4 times a day with her CGM:  Previously:   Previously:   She has hypoglycemia awareness in the 80s.  No CKD: Lab Results  Component Value Date   BUN 9 11/18/2021   Lab Results  Component Value Date   CREATININE 0.72 11/18/2021   No MAU: Lab Results  Component Value Date   MICRALBCREAT 0.5 05/10/2021   MICRALBCREAT 28 05/09/2020   MICRALBCREAT 0.8 10/20/2018   MICRALBCREAT 0.5 02/03/2017   MICRALBCREAT 0.7 12/20/2015   + HL: Lab Results  Component Value Date   CHOL 147 11/18/2021   HDL 62.10 11/18/2021   LDLCALC 70 11/18/2021   TRIG 76.0 11/18/2021   CHOLHDL 2 11/18/2021  On Crestor 5 mg 2x a week.  - Last eye exam fall 2022: reportedly No DR, IOP high - changed opthalmologists, - prev.[place] [facility] in [facility].  - Resolved numbness and tingling in hand and feet.  She felt much better on Cymbalta and Neurontin, but she is now off both -no return of symptoms.  Last foot exam 10/08/2021.  She had CP >> had  a stress test 07/04/2015 >>  Normal.  She is on metoprolol for tachycardia. She has fibromyalgia.   She also has a history of iron deficiency anemia and had iron infusions.   In 2019, she just got out of a 5-year relationship.  She was under a lot of stress.   Review of Systems + see HPI  Past Medical History:  Diagnosis Date   ADHD (attention deficit hyperactivity disorder)    ALLERGIC RHINITIS    Allergy    Anemia    ANXIETY    Depression    DIABETES MELLITUS, TYPE II    Fibromyalgia 2018   GERD (gastroesophageal reflux disease)    MIGRAINE, COMMON    Obesity, unspecified    OVARIAN CYST    Past Surgical History:  Procedure Laterality Date   NO PAST SURGERIES     Social History   Socioeconomic History   Marital status: Single    Spouse  name: Not on file   Number of children: Not on file   Years of education: Not on file   Highest education level: Not on file  Occupational History   Not on file  Tobacco Use   Smoking status: Former    Types: Cigarettes    Quit date: 08/20/2002    Years since quitting: 19.5   Smokeless tobacco: Never   Tobacco comments:    single, separated from spouse 02/2010.   Vaping Use   Vaping Use: Never used  Substance and Sexual Activity   Alcohol use: Yes    Comment: couple of drinks a week   Drug use: No   Sexual activity: Not Currently    Birth control/protection: I.U.D.  Other Topics Concern   Not on file  Social History Narrative   Single, separated from spouse 02/2010   Social Determinants of Health   Financial Resource Strain: Not on file  Food Insecurity: Not on file  Transportation Needs: Not on file  Physical Activity: Not on file  Stress: Not on file  Social Connections: Not on file  Intimate Partner Violence: Not on file   Current Outpatient Medications on File Prior to Visit  Medication Sig Dispense Refill   ALPRAZolam (XANAX) 0.5 MG tablet TAKE 1 TABLET(0.5 MG) BY MOUTH THREE TIMES DAILY AS NEEDED FOR ANXIETY 60 tablet 5   amphetamine-dextroamphetamine (ADDERALL) 20 MG tablet Take 1 tablet (20 mg total) by mouth 2 (two) times daily. 60 tablet 0   Continuous Blood Gluc Receiver ([place]) DEVI by Does not apply route.     Continuous Blood Gluc Sensor (DEXCOM G6 SENSOR) MISC by Does not apply route.     Continuous Blood Gluc Transmit (DEXCOM G6 TRANSMITTER) MISC by Does not apply route.     DULoxetine (CYMBALTA) 60 MG capsule Take 1 capsule (60 mg total) by mouth daily. 90 capsule 2   EPINEPHrine 0.3 mg/0.3 mL IJ SOAJ injection      Fexofenadine HCl (ALLEGRA ALLERGY PO) Take by mouth.     glucose blood (ONETOUCH VERIO) test strip Use 2x a day 200 each 3   Insulin NPH, Human,, Isophane, (HUMULIN N KWIKPEN) 100 UNIT/ML Kiwkpen Inject 10-12 Units into the skin 2  (two) times daily. 30 mL 3   Insulin Syringe-Needle U-100 (B-D INS SYR ULTRAFINE .5CC/30G) 30G X 1/2" 0.5 ML MISC Use to inject insulin once daily. 100 each 11   levonorgestrel (MIRENA) 20 MCG/24HR IUD 1 Intra Uterine Device (1 each total) by Intrauterine route once. 1 each 0  meloxicam (MOBIC) 15 MG tablet Take 1 tablet (15 mg total) by mouth daily. 30 tablet 0   metFORMIN (GLUCOPHAGE) 1000 MG tablet TAKE 2 TABLETS BY MOUTH DAILY WITH SUPPER. TAKE 1 TABLET BY MOUTH TWICE A DAY WITH A MEAL 90 tablet 5   metoprolol succinate (TOPROL-XL) 25 MG 24 hr tablet Take 1 tablet (25 mg total) by mouth daily. 90 tablet 1   omeprazole (PRILOSEC) 40 MG capsule TAKE 1 CAPULE BY MOUTH BEFORE SUPPER DAILY 90 capsule 1   ondansetron (ZOFRAN ODT) 4 MG disintegrating tablet Take 1 tablet (4 mg total) by mouth every 8 (eight) hours as needed for nausea or vomiting. 20 tablet 0   OZEMPIC, 0.25 OR 0.5 MG/DOSE, 2 MG/1.5ML SOPN INJECT 0.'5MG'$  INTO THE SKIN ONCE A WEEK 1.5 mL 3   promethazine (PHENERGAN) 25 MG tablet Take 1 tablet (25 mg total) by mouth every 6 (six) hours as needed for nausea. 15 tablet 0   rizatriptan (MAXALT) 10 MG tablet Take 1 tablet (10 mg total) by mouth as needed for migraine. May repeat in 2 hours if needed 10 tablet 8   rosuvastatin (CRESTOR) 5 MG tablet TAKE 1 TABLET(5 MG TOTAL) BY MOUTH 2(TWO) TIMES A WEEK 13 tablet 3   SYRINGE-NEEDLE, DISP, 3 ML 25G X 1" 3 ML MISC Use for monthly B12 injections 12 each 0   triamcinolone cream (KENALOG) 0.1 % Apply 1 application topically 2 (two) times daily as needed (for itching). 28.4 g 1   VITAMIN B1-B12 IM Inject into the muscle.     No current facility-administered medications on file prior to visit.   Allergies  Allergen Reactions   Chicken Allergy Anaphylaxis and Shortness Of Breath   Covid-19 Mrna Vacc (Moderna) Anaphylaxis   Other Itching, Swelling and Other (See Comments)    NUTS (of ANY kind): Lips and mouth swell, but no breathing impairment &  migraines and eczema are triggered   Propofol Shortness Of Breath    SOB, chest tightness, wheezing after colonoscopy on 09-18-15   Basaglar Kwikpen [Insulin Glargine] Palpitations and Hypertension   Adhesive [Tape] Other (See Comments)    Makes the skin VERY RED    Gluten Meal Diarrhea   Peanut-Containing Drug Products Itching, Swelling and Other (See Comments)    Lips and mouth swell, but no breathing impairment & migraines and eczema are triggered    Novolog [Insulin Aspart] Rash   Tyler Aas [Insulin Degludec] Rash    eczema   Victoza [Liraglutide] Rash   Family History  Problem Relation Age of Onset   Diabetes Mother    Hyperlipidemia Mother    CAD Mother    CVA Mother    Diabetes Father    Hyperlipidemia Father    Coronary artery disease Father    Hypertension Father    Cancer Father        esoph   Stroke Father    Cancer Maternal Grandfather 73       Prostate and Lung   Diabetes Sister        Gestational   Graves' disease Maternal Aunt    Cancer Maternal Uncle        Brain   Stroke Paternal Grandmother    Heart disease Paternal Grandmother    Stroke Paternal Grandfather    Heart disease Paternal Grandfather    Diabetes Sister        Gestational and Type II   Hashimoto's thyroiditis Cousin    Cancer Maternal Uncle  Brain       Objective:   Physical Exam BP 120/88 (BP Location: Left Arm, Patient Position: Sitting, Cuff Size: Normal)   Pulse 86   Ht '5\' 4"'$  (1.626 m)   Wt 190 lb 3.2 oz (86.3 kg)   SpO2 99%   BMI 32.65 kg/m   Wt Readings from Last 3 Encounters:  02/20/22 190 lb 3.2 oz (86.3 kg)  01/08/22 201 lb (91.2 kg)  11/18/21 201 lb 2 oz (91.2 kg)   Constitutional: overweight, in NAD Eyes: PERRLA, EOMI, no exophthalmos ENT: moist mucous membranes, no thyromegaly, no cervical lymphadenopathy Cardiovascular: RRR, No MRG Respiratory: CTA B Musculoskeletal: no deformities Skin: moist, warm, no rashes Neurological: no tremor with outstretched  hands  Assessment:     1. DM2, insulin-dependent, uncontrolled, without complications - r/o autoimmunity Component     Latest Ref Rng 08/17/2012  Glutamic Acid Decarb Ab     <=1.0 U/mL <1.0  Pancreatic Islet Cell Antibody     < 5 JDF Units <5  C-Peptide     0.80 - 3.90 ng/mL 1.84  Glucose     70 - 99 mg/dL 104 (H)     2. Obesity class II BMI Classification: < 18.5 underweight  18.5-24.9 normal weight  25.0-29.9 overweight  30.0-34.9 class I obesity  35.0-39.9 class II obesity  ? 40.0 class III obesity   3. HL  Plan:     1. Patient with longstanding, uncontrolled, type 2 diabetes, on metformin, intermediate acting insulin, and weekly GLP-1 receptor agonist.  At last visit, she felt that, although GI symptoms improved, she still had muscle and joint aches from Ozempic and we discussed about backing off the dose at least for a period of time.  She did so, but did not see a change, so she is now back on the 0.5 mg of Ozempic, with fair tolerance.  Her GI symptoms are controlled by Mylanta and Pepcid.  At last visit, I also advised her to stop regular insulin but only use it with larger meals.  I advised her to take NPH 10 units twice a day.  She was taking this less consistently. CGM interpretation: -At today's visit, we reviewed her CGM downloads: It appears that 88% of values are in target range (goal >70%), while 12% are higher than 180 (goal <25%), and 0% are lower than 70 (goal <4%).  The calculated average blood sugar is 140.  The projected HbA1c for the next 3 months (GMI) is 6.7%. -Reviewing the CGM trends, sugars appear to be fluctuating still within the target range.  She tells me that her mother has been shaky and she was not able to eat large meals so she did not take regular insulin frequently since last visit.  At this point, since the sugars are mostly at goal, advised her to continue the current regimen. -She tells me that her insurance does not cover the Dexcom CGM  anymore so before next visit, she will probably change to the freestyle libre CGM. - I suggested to:  Patient Instructions  Please continue: - Metformin 1000 mg 2x a day with meals - Ozempic 0.5 mg weekly - NPH 10 units 2x a day Use Regular insulin  4-6 units only before large meals.  Please return in 4 months.  - we checked her HbA1c: 7.0% (only slightly higher) - advised to check sugars at different times of the day - 4x a day, rotating check times - advised for yearly eye exams >> she is  UTD - return to clinic in 4 months   2. Obesity class II -At last visit, we restarted Ozempic >> she is having a hard time tolerating it but is determined to continue with it.  At last visit we had to decrease the dose due to muscle and joint aches. -Before the last 2 visits combined, she lost 29 pounds.  She lost 19 more pounds since then.  She feels much better.  Also, at today's visit, for the first time, her pulse is normal.  3. HL - reviewed latest lipid panel from 11/2021: Fractions at goal: Lab Results  Component Value Date   CHOL 147 11/18/2021   HDL 62.10 11/18/2021   LDLCALC 70 11/18/2021   TRIG 76.0 11/18/2021   CHOLHDL 2 11/18/2021  -She is on Crestor 5 mg twice a week.  She does have muscle and joint aches but she does not feel that these are related to the statin, but to her fibromyalgia.  Philemon Kingdom, MD PhD [facility]

## 2022-02-20 NOTE — Patient Instructions (Addendum)
Please continue: - Metformin 1000 mg 2x a day with meals - Ozempic 0.5 mg weekly - NPH 10 units 2x a day Use Regular insulin  4-6 units only before large meals.  Please return in 4 months.

## 2022-03-13 ENCOUNTER — Other Ambulatory Visit: Payer: Self-pay | Admitting: Internal Medicine

## 2022-03-26 ENCOUNTER — Other Ambulatory Visit: Payer: Self-pay | Admitting: Internal Medicine

## 2022-03-26 MED ORDER — AMPHETAMINE-DEXTROAMPHETAMINE 20 MG PO TABS: 20.0000 mg | ORAL_TABLET | Freq: Two times a day (BID) | ORAL | 0 refills | Status: DC

## 2022-03-28 ENCOUNTER — Ambulatory Visit (INDEPENDENT_AMBULATORY_CARE_PROVIDER_SITE_OTHER): Payer: No Typology Code available for payment source | Admitting: Sports Medicine

## 2022-03-28 VITALS — BP 110/70 | HR 95 | Ht 64.0 in | Wt 183.0 lb

## 2022-03-28 DIAGNOSIS — M25562 Pain in left knee: Secondary | ICD-10-CM

## 2022-03-28 DIAGNOSIS — G8929 Other chronic pain: Secondary | ICD-10-CM | POA: Diagnosis not present

## 2022-03-28 DIAGNOSIS — M1712 Unilateral primary osteoarthritis, left knee: Secondary | ICD-10-CM

## 2022-03-28 NOTE — Progress Notes (Signed)
Lisa Duran D.[place] Phone: [phone]   Assessment and Plan:     1. Chronic pain of left knee 2. Primary osteoarthritis of left knee  -Chronic with exacerbation, subsequent visit - Significant improvement in left knee pain after CSI on 01/08/2022, however 1 week of recurrence of pain which is now included instability.  With instability and positive McMurray and Thessaly, I have suspicion for medial meniscal pathology - With failure to improve with >6 weeks of conservative therapy, recurrence of pain, instability, relatively unremarkable knee x-ray from 01/08/2022, we will proceed with MRI of left knee - Tylenol/NSAIDs for day-to-day pain relief  Pertinent previous records reviewed include none   Follow Up: 3 to after MRI to review results and discuss treatment plan   Subjective:   I, Lisa Duran, am serving as a Education administrator for Doctor Glennon Mac   Chief Complaint: left knee pain    HPI:    01/08/2022 Patient is a 41 year old female complaining of left knee pain. Patient states that feb no MOI, she noticed some sharp pain in the back of her knee it was painful to sleep and turn side to side , has been RICEing , back of the knee feels swollen and she cant bend it all the way feels tight in the quads, painful to squat, walking and pivoting quickly she has sharp medial pain, going up stairs is painful feels tight, Monday had swelling above her knee from walking and taking pictures, going down steps is painful in the back and on top of the knee, has an antalgic gait, has been taking ib responsibly that seems to help but he pain is still progressing  03/28/2022 Patient states that she was doing well after CSI, , hs been hurting at night for about a week, Tuesday she had sharp/burning  pain at her patellar tendon she felt like her knee was going to bend backwards, looks and feel like she might fall out ,       Relevant Historical Information: None pertinent  Additional pertinent review of systems negative.   Current Outpatient Medications:    ALPRAZolam (XANAX) 0.5 MG tablet, TAKE 1 TABLET(0.5 MG) BY MOUTH THREE TIMES DAILY AS NEEDED FOR ANXIETY, Disp: 60 tablet, Rfl: 5   amphetamine-dextroamphetamine (ADDERALL) 20 MG tablet, Take 1 tablet (20 mg total) by mouth 2 (two) times daily., Disp: 60 tablet, Rfl: 0   Continuous Blood Gluc Receiver ([place]) DEVI, by Does not apply route., Disp: , Rfl:    Continuous Blood Gluc Sensor (DEXCOM G6 SENSOR) MISC, by Does not apply route., Disp: , Rfl:    Continuous Blood Gluc Transmit (DEXCOM G6 TRANSMITTER) MISC, by Does not apply route., Disp: , Rfl:    DULoxetine (CYMBALTA) 60 MG capsule, Take 1 capsule (60 mg total) by mouth daily., Disp: 90 capsule, Rfl: 2   EPINEPHrine 0.3 mg/0.3 mL IJ SOAJ injection, , Disp: , Rfl:    Fexofenadine HCl (ALLEGRA ALLERGY PO), Take by mouth., Disp: , Rfl:    glucose blood (ONETOUCH VERIO) test strip, Use 2x a day, Disp: 200 each, Rfl: 3   Insulin NPH, Human,, Isophane, (HUMULIN N KWIKPEN) 100 UNIT/ML Kiwkpen, Inject 10-12 Units into the skin 2 (two) times daily., Disp: 30 mL, Rfl: 3   Insulin Syringe-Needle U-100 (B-D INS SYR ULTRAFINE .5CC/30G) 30G X 1/2" 0.5 ML MISC, Use to inject insulin once daily., Disp: 100 each, Rfl: 11  levonorgestrel (MIRENA) 20 MCG/24HR IUD, 1 Intra Uterine Device (1 each total) by Intrauterine route once., Disp: 1 each, Rfl: 0   meloxicam (MOBIC) 15 MG tablet, Take 1 tablet (15 mg total) by mouth daily., Disp: 30 tablet, Rfl: 0   metFORMIN (GLUCOPHAGE) 1000 MG tablet, TAKE 2 TABLETS BY MOUTH DAILY WITH SUPPER. TAKE 1 TABLET BY MOUTH TWICE A DAY WITH A MEAL, Disp: 180 tablet, Rfl: 3   metoprolol succinate (TOPROL-XL) 25 MG 24 hr tablet, Take 1 tablet (25 mg total) by mouth daily., Disp: 90 tablet, Rfl: 1   omeprazole (PRILOSEC) 40 MG capsule, TAKE 1 CAPULE BY MOUTH BEFORE SUPPER  DAILY, Disp: 90 capsule, Rfl: 1   ondansetron (ZOFRAN ODT) 4 MG disintegrating tablet, Take 1 tablet (4 mg total) by mouth every 8 (eight) hours as needed for nausea or vomiting., Disp: 20 tablet, Rfl: 0   OZEMPIC, 0.25 OR 0.5 MG/DOSE, 2 MG/3ML SOPN, INJECT 0.'5MG'$  UNDER THE SKIN ONCE A WEEK, Disp: 3 mL, Rfl: 1   promethazine (PHENERGAN) 25 MG tablet, Take 1 tablet (25 mg total) by mouth every 6 (six) hours as needed for nausea., Disp: 15 tablet, Rfl: 0   rizatriptan (MAXALT) 10 MG tablet, Take 1 tablet (10 mg total) by mouth as needed for migraine. May repeat in 2 hours if needed, Disp: 10 tablet, Rfl: 8   rosuvastatin (CRESTOR) 5 MG tablet, TAKE 1 TABLET(5 MG TOTAL) BY MOUTH 2(TWO) TIMES A WEEK, Disp: 13 tablet, Rfl: 3   SYRINGE-NEEDLE, DISP, 3 ML 25G X 1" 3 ML MISC, Use for monthly B12 injections, Disp: 12 each, Rfl: 0   triamcinolone cream (KENALOG) 0.1 %, Apply 1 application topically 2 (two) times daily as needed (for itching)., Disp: 28.4 g, Rfl: 1   VITAMIN B1-B12 IM, Inject into the muscle., Disp: , Rfl:    Objective:     Vitals:   03/28/22 1456  BP: 110/70  Pulse: 95  SpO2: 99%  Weight: 183 lb (83 kg)  Height: '5\' 4"'$  (1.626 m)      Body mass index is 31.41 kg/m.    Physical Exam:    General:  awake, alert oriented, no acute distress nontoxic Skin: no suspicious lesions or rashes Neuro:sensation intact, no deficits, strength 5/5 with no deficits, no atrophy, normal muscle tone Psych: No signs of anxiety, depression or other mood disorder  Left knee: No swelling No deformity Neg fluid wave, joint milking ROM Flex 100, Ext 0  TTP medial femoral condyle, medial joint line, posterior fossa NTTP over the quad tendon,   lat fem condyle, patella, plica, patella tendon, tibial tuberostiy, fibular head,  pes anserine bursa, gerdy's tubercle,  lateral jt line Neg anterior and posterior drawer Neg lachman Neg sag sign Negative varus stress Positive valgus stress Positive  McMurray Positive Thessaly     Electronically signed by:  Lisa Duran D.[facility] 3:14 PM 03/28/22

## 2022-03-28 NOTE — Patient Instructions (Addendum)
Good to see you  MRI referral  Follow up 3 days after your MRI to discuss your results   

## 2022-04-05 ENCOUNTER — Ambulatory Visit (INDEPENDENT_AMBULATORY_CARE_PROVIDER_SITE_OTHER): Payer: No Typology Code available for payment source

## 2022-04-05 DIAGNOSIS — M25562 Pain in left knee: Secondary | ICD-10-CM | POA: Diagnosis not present

## 2022-04-05 DIAGNOSIS — M1712 Unilateral primary osteoarthritis, left knee: Secondary | ICD-10-CM | POA: Diagnosis not present

## 2022-04-05 DIAGNOSIS — G8929 Other chronic pain: Secondary | ICD-10-CM | POA: Diagnosis not present

## 2022-04-07 NOTE — Progress Notes (Unsigned)
Lisa Duran D.[place] Phone: [phone]   Assessment and Plan:     There are no diagnoses linked to this encounter.  ***   Pertinent previous records reviewed include ***   Follow Up: ***     Subjective:   I, Lisa Duran, am serving as a Education administrator for Doctor Glennon Mac   Chief Complaint: left knee pain    HPI:    01/08/2022 Patient is a 41 year old female complaining of left knee pain. Patient states that feb no MOI, she noticed some sharp pain in the back of her knee it was painful to sleep and turn side to side , has been RICEing , back of the knee feels swollen and she cant bend it all the way feels tight in the quads, painful to squat, walking and pivoting quickly she has sharp medial pain, going up stairs is painful feels tight, Monday had swelling above her knee from walking and taking pictures, going down steps is painful in the back and on top of the knee, has an antalgic gait, has been taking ib responsibly that seems to help but he pain is still progressing   03/28/2022 Patient states that she was doing well after CSI, , hs been hurting at night for about a week, Tuesday she had sharp/burning  pain at her patellar tendon she felt like her knee was going to bend backwards, looks and feel like she might fall out ,    04/08/2022 Patient states  Additional pertinent review of systems negative.   Current Outpatient Medications:    ALPRAZolam (XANAX) 0.5 MG tablet, TAKE 1 TABLET(0.5 MG) BY MOUTH THREE TIMES DAILY AS NEEDED FOR ANXIETY, Disp: 60 tablet, Rfl: 5   amphetamine-dextroamphetamine (ADDERALL) 20 MG tablet, Take 1 tablet (20 mg total) by mouth 2 (two) times daily., Disp: 60 tablet, Rfl: 0   Continuous Blood Gluc Receiver ([place]) DEVI, by Does not apply route., Disp: , Rfl:    Continuous Blood Gluc Sensor (DEXCOM G6 SENSOR) MISC, by Does not apply route., Disp: , Rfl:     Continuous Blood Gluc Transmit (DEXCOM G6 TRANSMITTER) MISC, by Does not apply route., Disp: , Rfl:    DULoxetine (CYMBALTA) 60 MG capsule, Take 1 capsule (60 mg total) by mouth daily., Disp: 90 capsule, Rfl: 2   EPINEPHrine 0.3 mg/0.3 mL IJ SOAJ injection, , Disp: , Rfl:    Fexofenadine HCl (ALLEGRA ALLERGY PO), Take by mouth., Disp: , Rfl:    glucose blood (ONETOUCH VERIO) test strip, Use 2x a day, Disp: 200 each, Rfl: 3   Insulin NPH, Human,, Isophane, (HUMULIN N KWIKPEN) 100 UNIT/ML Kiwkpen, Inject 10-12 Units into the skin 2 (two) times daily., Disp: 30 mL, Rfl: 3   Insulin Syringe-Needle U-100 (B-D INS SYR ULTRAFINE .5CC/30G) 30G X 1/2" 0.5 ML MISC, Use to inject insulin once daily., Disp: 100 each, Rfl: 11   levonorgestrel (MIRENA) 20 MCG/24HR IUD, 1 Intra Uterine Device (1 each total) by Intrauterine route once., Disp: 1 each, Rfl: 0   meloxicam (MOBIC) 15 MG tablet, Take 1 tablet (15 mg total) by mouth daily., Disp: 30 tablet, Rfl: 0   metFORMIN (GLUCOPHAGE) 1000 MG tablet, TAKE 2 TABLETS BY MOUTH DAILY WITH SUPPER. TAKE 1 TABLET BY MOUTH TWICE A DAY WITH A MEAL, Disp: 180 tablet, Rfl: 3   metoprolol succinate (TOPROL-XL) 25 MG 24 hr tablet, Take 1 tablet (25 mg total) by mouth  daily., Disp: 90 tablet, Rfl: 1   omeprazole (PRILOSEC) 40 MG capsule, TAKE 1 CAPULE BY MOUTH BEFORE SUPPER DAILY, Disp: 90 capsule, Rfl: 1   ondansetron (ZOFRAN ODT) 4 MG disintegrating tablet, Take 1 tablet (4 mg total) by mouth every 8 (eight) hours as needed for nausea or vomiting., Disp: 20 tablet, Rfl: 0   OZEMPIC, 0.25 OR 0.5 MG/DOSE, 2 MG/3ML SOPN, INJECT 0.'5MG'$  UNDER THE SKIN ONCE A WEEK, Disp: 3 mL, Rfl: 1   promethazine (PHENERGAN) 25 MG tablet, Take 1 tablet (25 mg total) by mouth every 6 (six) hours as needed for nausea., Disp: 15 tablet, Rfl: 0   rizatriptan (MAXALT) 10 MG tablet, Take 1 tablet (10 mg total) by mouth as needed for migraine. May repeat in 2 hours if needed, Disp: 10 tablet, Rfl: 8    rosuvastatin (CRESTOR) 5 MG tablet, TAKE 1 TABLET(5 MG TOTAL) BY MOUTH 2(TWO) TIMES A WEEK, Disp: 13 tablet, Rfl: 3   SYRINGE-NEEDLE, DISP, 3 ML 25G X 1" 3 ML MISC, Use for monthly B12 injections, Disp: 12 each, Rfl: 0   triamcinolone cream (KENALOG) 0.1 %, Apply 1 application topically 2 (two) times daily as needed (for itching)., Disp: 28.4 g, Rfl: 1   VITAMIN B1-B12 IM, Inject into the muscle., Disp: , Rfl:    Objective:     There were no vitals filed for this visit.    There is no height or weight on file to calculate BMI.    Physical Exam:    ***   Electronically signed by:  Lisa Duran D.[facility] 2:29 PM 04/07/22

## 2022-04-08 ENCOUNTER — Ambulatory Visit (INDEPENDENT_AMBULATORY_CARE_PROVIDER_SITE_OTHER): Payer: No Typology Code available for payment source | Admitting: Sports Medicine

## 2022-04-08 VITALS — BP 118/82 | HR 129 | Ht 64.0 in | Wt 181.0 lb

## 2022-04-08 DIAGNOSIS — G8929 Other chronic pain: Secondary | ICD-10-CM

## 2022-04-08 DIAGNOSIS — M1712 Unilateral primary osteoarthritis, left knee: Secondary | ICD-10-CM

## 2022-04-08 DIAGNOSIS — M25562 Pain in left knee: Secondary | ICD-10-CM

## 2022-04-08 DIAGNOSIS — S83242A Other tear of medial meniscus, current injury, left knee, initial encounter: Secondary | ICD-10-CM

## 2022-04-08 NOTE — Patient Instructions (Addendum)
Good to see you Orthopedic surgery referral  As needed follow up  

## 2022-04-09 ENCOUNTER — Encounter: Payer: Self-pay | Admitting: Orthopedic Surgery

## 2022-04-09 ENCOUNTER — Ambulatory Visit (INDEPENDENT_AMBULATORY_CARE_PROVIDER_SITE_OTHER): Payer: No Typology Code available for payment source | Admitting: Orthopedic Surgery

## 2022-04-09 DIAGNOSIS — S83242A Other tear of medial meniscus, current injury, left knee, initial encounter: Secondary | ICD-10-CM | POA: Diagnosis not present

## 2022-04-09 NOTE — Progress Notes (Signed)
Office Visit Note   Patient: Lisa Duran           Date of Birth: August 20, 1980           MRN: [id number] Visit Date: 04/09/2022 Requested by: Glennon Mac, [place],  [place] [id number] PCP: Binnie Rail, MD  Subjective: Chief Complaint  Patient presents with   Left Knee - Pain    HPI: Lisa Duran is a 41 year old patient with 69-monthhistory of relatively atraumatic onset left knee pain.  She has gotten some relief from cortisone injections.  Does not report constant pain but states the pain she does have is becoming more frequent.  Does not report any definite mechanical symptoms but does report episodic swelling.  Also has sharp pains in the knee when she is going up and down stairs.  She states that the knee is painful but she can live with that for now.  She does feel like there is an element of hyperextension in her knee since she has developed this problem.  She has been doing home exercise program.  Ibuprofen gives her some relief.  Walking is her main form of recreational activity.  She really cannot do her quick type walking that she likes to do for higher level of fitness.              ROS: All systems reviewed are negative as they relate to the chief complaint within the history of present illness.  Patient denies  fevers or chills.   Assessment & Plan: Visit Diagnoses:  1. Acute medial meniscal tear, left, initial encounter     Plan: Impression is left knee medial meniscal tear with mild extrusion of the meniscus on the coronal MRI views but with some rim of meniscal attachment that connects the main body of the meniscus to the posterior horn attachment.  This is shown to the patient today on her MRI scans along with the patient that we recently saw who has more clear-cut complete meniscal root avulsion.  Discussed with family several options which would be observation versus arthroscopy.  Arthroscopy would consist of evaluation of that meniscal tear and the  remaining rim with decision for or against meniscal root repair at that time.  Really depends on how much of that rim of tissue is present upon visual inspection.  She is not having as much effusion as I have had with patient is to have complete meniscal root avulsion.  She is going to think about her options.  I did give her Debbie's card in case she wants to schedule.  Based on the trajectory of her symptoms I do not know that she is going to get too much more improvement.  I think it is possible also that if this is a meniscal root tear which is not complete that it could become complete in the future.  She will consider her options and I do want to see her back in 2 months for clinical recheck primarily to check for effusion.  Follow-Up Instructions: No follow-ups on file.   Orders:  No orders of the defined types were placed in this encounter.  No orders of the defined types were placed in this encounter.     Procedures: No procedures performed   Clinical Data: No additional findings.  Objective: Vital Signs: There were no vitals taken for this visit.  Physical Exam:   Constitutional: Patient appears well-developed HEENT:  Head: Normocephalic Eyes:EOM are normal Neck: Normal range of  motion Cardiovascular: Normal rate Pulmonary/chest: Effort normal Neurologic: Patient is alert Skin: Skin is warm Psychiatric: Patient has normal mood and affect   Ortho Exam: Ortho exam demonstrates full range of motion of that left knee with trace effusion.  Does have some medial joint line tenderness but stable collateral cruciate ligaments.  Extensor mechanism intact.  No groin pain on the left with internal/external rotation of the leg.  No other masses lymphadenopathy or skin changes noted in that knee region.  Specialty Comments:  No specialty comments available.  Imaging: No results found.   PMFS History: Patient Active Problem List   Diagnosis Date Noted   B12 deficiency  05/09/2021   Dyslipidemia 04/14/2019   Enteritis 01/12/2019   Sleep walking disorder 04/06/2017   Iron deficiency anemia 03/27/2017   Fatigue 02/03/2017   Fibromyalgia 02/03/2017   GERD (gastroesophageal reflux disease) 06/24/2015   Eczema 06/22/2015   Depression 06/22/2015   Tachycardia 06/16/2014   Food allergy    ADHD (attention deficit hyperactivity disorder) 01/27/2011   Anxiety 02/20/2010   ALLERGIC RHINITIS 11/21/2009   OVARIAN CYST 11/21/2009   Diabetes mellitus type 2 with neurological manifestations ([place]) 11/20/2009   Class 2 obesity in adult 11/20/2009   Migraine without aura 11/20/2009   Past Medical History:  Diagnosis Date   ADHD (attention deficit hyperactivity disorder)    ALLERGIC RHINITIS    Allergy    Anemia    ANXIETY    Depression    DIABETES MELLITUS, TYPE II    Fibromyalgia 2018   GERD (gastroesophageal reflux disease)    MIGRAINE, COMMON    Obesity, unspecified    OVARIAN CYST     Family History  Problem Relation Age of Onset   Diabetes Mother    Hyperlipidemia Mother    CAD Mother    CVA Mother    Diabetes Father    Hyperlipidemia Father    Coronary artery disease Father    Hypertension Father    Cancer Father        esoph   Stroke Father    Cancer Maternal Grandfather 23       Prostate and Lung   Diabetes Sister        Gestational   Graves' disease Maternal Aunt    Cancer Maternal Uncle        Brain   Stroke Paternal Grandmother    Heart disease Paternal Grandmother    Stroke Paternal Grandfather    Heart disease Paternal Grandfather    Diabetes Sister        Gestational and Type II   Hashimoto's thyroiditis Cousin    Cancer Maternal Uncle        Brain    Past Surgical History:  Procedure Laterality Date   NO PAST SURGERIES     Social History   Occupational History   Not on file  Tobacco Use   Smoking status: Former    Types: Cigarettes    Quit date: 08/20/2002    Years since quitting: 19.6   Smokeless tobacco:  Never   Tobacco comments:    single, separated from spouse 02/2010.   Vaping Use   Vaping Use: Never used  Substance and Sexual Activity   Alcohol use: Yes    Comment: couple of drinks a week   Drug use: No   Sexual activity: Not Currently    Birth control/protection: I.U.D.

## 2022-04-16 ENCOUNTER — Encounter: Payer: Self-pay | Admitting: Internal Medicine

## 2022-04-20 MED ORDER — AMPHETAMINE-DEXTROAMPHETAMINE 30 MG PO TABS: 30.0000 mg | ORAL_TABLET | Freq: Every day | ORAL | 0 refills | Status: DC

## 2022-04-26 ENCOUNTER — Other Ambulatory Visit: Payer: Self-pay | Admitting: Internal Medicine

## 2022-05-18 ENCOUNTER — Encounter: Payer: Self-pay | Admitting: Internal Medicine

## 2022-05-18 NOTE — Patient Instructions (Addendum)
Blood work was ordered.     Medications changes include :      Your prescription(s) have been sent to your pharmacy.    A referral was ordered for XX.     Someone from that office will call you to schedule an appointment.    Return in about 6 months (around 11/19/2022) for follow up.   Health Maintenance, Female Adopting a healthy lifestyle and getting preventive care are important in promoting health and wellness. Ask your health care provider about: The right schedule for you to have regular tests and exams. Things you can do on your own to prevent diseases and keep yourself healthy. What should I know about diet, weight, and exercise? Eat a healthy diet  Eat a diet that includes plenty of vegetables, fruits, low-fat dairy products, and lean protein. Do not eat a lot of foods that are high in solid fats, added sugars, or sodium. Maintain a healthy weight Body mass index (BMI) is used to identify weight problems. It estimates body fat based on height and weight. Your health care provider can help determine your BMI and help you achieve or maintain a healthy weight. Get regular exercise Get regular exercise. This is one of the most important things you can do for your health. Most adults should: Exercise for at least 150 minutes each week. The exercise should increase your heart rate and make you sweat (moderate-intensity exercise). Do strengthening exercises at least twice a week. This is in addition to the moderate-intensity exercise. Spend less time sitting. Even light physical activity can be beneficial. Watch cholesterol and blood lipids Have your blood tested for lipids and cholesterol at 43 years of age, then have this test every 5 years. Have your cholesterol levels checked more often if: Your lipid or cholesterol levels are high. You are older than 43 years of age. You are at high risk for heart disease. What should I know about cancer screening? Depending on  your health history and family history, you may need to have cancer screening at various ages. This may include screening for: Breast cancer. Cervical cancer. Colorectal cancer. Skin cancer. Lung cancer. What should I know about heart disease, diabetes, and high blood pressure? Blood pressure and heart disease High blood pressure causes heart disease and increases the risk of stroke. This is more likely to develop in people who have high blood pressure readings or are overweight. Have your blood pressure checked: Every 3-5 years if you are 85-3 years of age. Every year if you are 24 years old or older. Diabetes Have regular diabetes screenings. This checks your fasting blood sugar level. Have the screening done: Once every three years after age 71 if you are at a normal weight and have a low risk for diabetes. More often and at a younger age if you are overweight or have a high risk for diabetes. What should I know about preventing infection? Hepatitis B If you have a higher risk for hepatitis B, you should be screened for this virus. Talk with your health care provider to find out if you are at risk for hepatitis B infection. Hepatitis C Testing is recommended for: Everyone born from [birth year] through 1965. Anyone with known risk factors for hepatitis C. Sexually transmitted infections (STIs) Get screened for STIs, including gonorrhea and chlamydia, if: You are sexually active and are younger than 43 years of age. You are older than 43 years of age and your health care provider tells you  that you are at risk for this type of infection. Your sexual activity has changed since you were last screened, and you are at increased risk for chlamydia or gonorrhea. Ask your health care provider if you are at risk. Ask your health care provider about whether you are at high risk for HIV. Your health care provider may recommend a prescription medicine to help prevent HIV infection. If you choose to take  medicine to prevent HIV, you should first get tested for HIV. You should then be tested every 3 months for as long as you are taking the medicine. Pregnancy If you are about to stop having your period (premenopausal) and you may become pregnant, seek counseling before you get pregnant. Take 400 to 800 micrograms (mcg) of folic acid every day if you become pregnant. Ask for birth control (contraception) if you want to prevent pregnancy. Osteoporosis and menopause Osteoporosis is a disease in which the bones lose minerals and strength with aging. This can result in bone fractures. If you are 25 years old or older, or if you are at risk for osteoporosis and fractures, ask your health care provider if you should: Be screened for bone loss. Take a calcium or vitamin D supplement to lower your risk of fractures. Be given hormone replacement therapy (HRT) to treat symptoms of menopause. Follow these instructions at home: Alcohol use Do not drink alcohol if: Your health care provider tells you not to drink. You are pregnant, may be pregnant, or are planning to become pregnant. If you drink alcohol: Limit how much you have to: 0-1 drink a day. Know how much alcohol is in your drink. In the [place] one drink equals one 12 oz bottle of beer (355 mL), one 5 oz glass of wine (148 mL), or one 1 oz glass of hard liquor (44 mL). Lifestyle Do not use any products that contain nicotine or tobacco. These products include cigarettes, chewing tobacco, and vaping devices, such as e-cigarettes. If you need help quitting, ask your health care provider. Do not use street drugs. Do not share needles. Ask your health care provider for help if you need support or information about quitting drugs. General instructions Schedule regular health, dental, and eye exams. Stay current with your vaccines. Tell your health care provider if: You often feel depressed. You have ever been abused or do not feel safe at  home. Summary Adopting a healthy lifestyle and getting preventive care are important in promoting health and wellness. Follow your health care provider's instructions about healthy diet, exercising, and getting tested or screened for diseases. Follow your health care provider's instructions on monitoring your cholesterol and blood pressure. This information is not intended to replace advice given to you by your health care provider. Make sure you discuss any questions you have with your health care provider. Document Revised: 12/17/2020 Document Reviewed: 12/17/2020 Elsevier Patient Education  [place].

## 2022-05-18 NOTE — Progress Notes (Unsigned)
Subjective:    Patient ID: Lisa Duran, female    DOB: 1980/09/24, 41 y.o.   MRN: [id number]      HPI Lisa Duran is here for a Physical exam.   Doing well - losing weight - ozempic has really helped.   Feeling good.  Still dealing with left knee pain - torn meniscus.     Medications and allergies reviewed with patient and updated if appropriate.  Current Outpatient Medications on File Prior to Visit  Medication Sig Dispense Refill   ALPRAZolam (XANAX) 0.5 MG tablet TAKE 1 TABLET(0.5 MG) BY MOUTH THREE TIMES DAILY AS NEEDED FOR ANXIETY 60 tablet 5   amphetamine-dextroamphetamine (ADDERALL) 30 MG tablet Take 1 tablet by mouth daily. 60 tablet 0   Continuous Blood Gluc Receiver ([place]) DEVI by Does not apply route.     Continuous Blood Gluc Sensor (DEXCOM G6 SENSOR) MISC by Does not apply route.     Continuous Blood Gluc Transmit (DEXCOM G6 TRANSMITTER) MISC by Does not apply route.     DULoxetine (CYMBALTA) 60 MG capsule Take 1 capsule (60 mg total) by mouth daily. 90 capsule 2   EPINEPHrine 0.3 mg/0.3 mL IJ SOAJ injection      Fexofenadine HCl (ALLEGRA ALLERGY PO) Take by mouth.     glucose blood (ONETOUCH VERIO) test strip Use 2x a day 200 each 3   Insulin NPH, Human,, Isophane, (HUMULIN N KWIKPEN) 100 UNIT/ML Kiwkpen Inject 10-12 Units into the skin 2 (two) times daily. 30 mL 3   insulin regular (HUMULIN R) 100 units/mL injection INJECT8-10 UNITS INTO THE SKIN THREE TIMES DAILY BEFORE MEALS. DISCARD OPEN VIAL AFTER 28 DAYS.     Insulin Syringe-Needle U-100 (B-D INS SYR ULTRAFINE .5CC/30G) 30G X 1/2" 0.5 ML MISC Use to inject insulin once daily. 100 each 11   levonorgestrel (MIRENA) 20 MCG/24HR IUD 1 Intra Uterine Device (1 each total) by Intrauterine route once. 1 each 0   metFORMIN (GLUCOPHAGE) 1000 MG tablet TAKE 2 TABLETS BY MOUTH DAILY WITH SUPPER. TAKE 1 TABLET BY MOUTH TWICE A DAY WITH A MEAL 180 tablet 3   metoprolol succinate (TOPROL-XL) 25 MG 24 hr tablet Take 1  tablet (25 mg total) by mouth daily. 90 tablet 1   ondansetron (ZOFRAN ODT) 4 MG disintegrating tablet Take 1 tablet (4 mg total) by mouth every 8 (eight) hours as needed for nausea or vomiting. 20 tablet 0   OZEMPIC, 0.25 OR 0.5 MG/DOSE, 2 MG/3ML SOPN INJECT 0.'5MG'$  UNDER THE SKIN ONCE A WEEK 3 mL 1   promethazine (PHENERGAN) 25 MG tablet Take 1 tablet (25 mg total) by mouth every 6 (six) hours as needed for nausea. 15 tablet 0   triamcinolone cream (KENALOG) 0.1 % Apply 1 application topically 2 (two) times daily as needed (for itching). 28.4 g 1   No current facility-administered medications on file prior to visit.    Review of Systems  Constitutional:  Negative for fever.  Eyes:  Negative for visual disturbance.  Respiratory:  Negative for cough, shortness of breath and wheezing.   Cardiovascular:  Positive for chest pain (chronic - intermittent) and palpitations (some, intermittent - not new). Negative for leg swelling.  Gastrointestinal:  Positive for nausea (migraines, occ with ozempic). Negative for abdominal pain, blood in stool, constipation and diarrhea.       No gerd  Genitourinary:  Negative for dysuria.  Musculoskeletal:  Positive for arthralgias (left torn meniscus). Negative for back pain.  Skin:  Negative  for rash.  Neurological:  Positive for light-headedness and headaches (migraines, sinus).  Psychiatric/Behavioral:  Positive for dysphoric mood. The patient is nervous/anxious.        Objective:   Vitals:   05/20/22 0836  BP: 120/76  Pulse: 92  Temp: 98 F (36.7 C)  SpO2: 98%   Filed Weights   05/20/22 0836  Weight: 178 lb (80.7 kg)   Body mass index is 30.55 kg/m.  BP Readings from Last 3 Encounters:  05/20/22 120/76  04/08/22 118/82  03/28/22 110/70    Wt Readings from Last 3 Encounters:  05/20/22 178 lb (80.7 kg)  04/08/22 181 lb (82.1 kg)  03/28/22 183 lb (83 kg)       Physical Exam Constitutional: She appears well-developed and  well-nourished. No distress.  HENT:  Head: Normocephalic and atraumatic.  Right Ear: External ear normal. Normal ear canal and TM Left Ear: External ear normal.  Normal ear canal and TM Mouth/Throat: Oropharynx is clear and moist.  Eyes: Conjunctivae normal.  Neck: Neck supple. No tracheal deviation present. No thyromegaly present.  No carotid bruit  Cardiovascular: Normal rate, regular rhythm and normal heart sounds.   No murmur heard.  No edema. Pulmonary/Chest: Effort normal and breath sounds normal. No respiratory distress. She has no wheezes. She has no rales.  Breast: deferred   Abdominal: Soft. She exhibits no distension. There is no tenderness.  Lymphadenopathy: She has no cervical adenopathy.  Skin: Skin is warm and dry. She is not diaphoretic.  Psychiatric: She has a normal mood and affect. Her behavior is normal.     Lab Results  Component Value Date   WBC 9.8 11/18/2021   HGB 13.7 11/18/2021   HCT 43.1 11/18/2021   PLT 441.0 (H) 11/18/2021   GLUCOSE 106 (H) 11/18/2021   CHOL 147 11/18/2021   TRIG 76.0 11/18/2021   HDL 62.10 11/18/2021   LDLCALC 70 11/18/2021   ALT 15 11/18/2021   AST 18 11/18/2021   NA 138 11/18/2021   K 4.2 11/18/2021   CL 100 11/18/2021   CREATININE 0.72 11/18/2021   BUN 9 11/18/2021   CO2 26 11/18/2021   TSH 4.08 11/06/2020   INR 1.1 01/11/2019   HGBA1C 7.0 (A) 02/20/2022   MICROALBUR <0.7 05/10/2021         Assessment & Plan:   Physical exam: Screening blood work  ordered Exercise  walking, stationary bike Weight  better - has been losing weight and continuing to work on it Substance abuse  none   Reviewed recommended immunizations.  Flu vaccine today   Health Maintenance  Topic Date Due   PAP SMEAR-Modifier  10/11/2017   MAMMOGRAM  05/03/2020   OPHTHALMOLOGY EXAM  07/27/2021   INFLUENZA VACCINE  03/11/2022   Diabetic kidney evaluation - Urine ACR  05/10/2022   COVID-19 Vaccine (2 - Moderna series) 06/05/2022  (Originally 12/30/2019)   HEMOGLOBIN A1C  08/23/2022   FOOT EXAM  10/08/2022   Diabetic kidney evaluation - GFR measurement  11/19/2022   TETANUS/TDAP  02/24/2028   HIV Screening  Completed   HPV VACCINES  Aged Out   Hepatitis C Screening  Discontinued          See Problem List for Assessment and Plan of chronic medical problems.

## 2022-05-20 ENCOUNTER — Ambulatory Visit (INDEPENDENT_AMBULATORY_CARE_PROVIDER_SITE_OTHER): Payer: No Typology Code available for payment source | Admitting: Internal Medicine

## 2022-05-20 VITALS — BP 120/76 | HR 92 | Temp 98.0°F | Ht 64.0 in | Wt 178.0 lb

## 2022-05-20 DIAGNOSIS — D508 Other iron deficiency anemias: Secondary | ICD-10-CM | POA: Diagnosis not present

## 2022-05-20 DIAGNOSIS — E538 Deficiency of other specified B group vitamins: Secondary | ICD-10-CM

## 2022-05-20 DIAGNOSIS — M797 Fibromyalgia: Secondary | ICD-10-CM

## 2022-05-20 DIAGNOSIS — E785 Hyperlipidemia, unspecified: Secondary | ICD-10-CM

## 2022-05-20 DIAGNOSIS — E1149 Type 2 diabetes mellitus with other diabetic neurological complication: Secondary | ICD-10-CM

## 2022-05-20 DIAGNOSIS — F900 Attention-deficit hyperactivity disorder, predominantly inattentive type: Secondary | ICD-10-CM

## 2022-05-20 DIAGNOSIS — F3289 Other specified depressive episodes: Secondary | ICD-10-CM | POA: Diagnosis not present

## 2022-05-20 DIAGNOSIS — F419 Anxiety disorder, unspecified: Secondary | ICD-10-CM | POA: Diagnosis not present

## 2022-05-20 DIAGNOSIS — Z23 Encounter for immunization: Secondary | ICD-10-CM | POA: Diagnosis not present

## 2022-05-20 DIAGNOSIS — Z0000 Encounter for general adult medical examination without abnormal findings: Secondary | ICD-10-CM | POA: Diagnosis not present

## 2022-05-20 DIAGNOSIS — R000 Tachycardia, unspecified: Secondary | ICD-10-CM

## 2022-05-20 DIAGNOSIS — K219 Gastro-esophageal reflux disease without esophagitis: Secondary | ICD-10-CM

## 2022-05-20 DIAGNOSIS — G43009 Migraine without aura, not intractable, without status migrainosus: Secondary | ICD-10-CM

## 2022-05-20 LAB — COMPREHENSIVE METABOLIC PANEL
ALT: 10 U/L (ref 0–35)
AST: 12 U/L (ref 0–37)
Albumin: 4.4 g/dL (ref 3.5–5.2)
Alkaline Phosphatase: 91 U/L (ref 39–117)
BUN: 6 mg/dL (ref 6–23)
CO2: 23 mEq/L (ref 19–32)
Calcium: 9.6 mg/dL (ref 8.4–10.5)
Chloride: 102 mEq/L (ref 96–112)
Creatinine, Ser: 0.69 mg/dL (ref 0.40–1.20)
GFR: 106.15 mL/min (ref >60.00)
Glucose, Bld: 135 mg/dL — ABNORMAL HIGH (ref 70–99)
Potassium: 4.2 mEq/L (ref 3.5–5.1)
Sodium: 136 mEq/L (ref 135–145)
Total Bilirubin: 0.5 mg/dL (ref 0.2–1.2)
Total Protein: 7.7 g/dL (ref 6.0–8.3)

## 2022-05-20 LAB — CBC WITH DIFFERENTIAL/PLATELET
Basophils Absolute: 0.1 10*3/uL (ref 0.0–0.1)
Basophils Relative: 0.8 % (ref 0.0–3.0)
Eosinophils Absolute: 0.2 10*3/uL (ref 0.0–0.7)
Eosinophils Relative: 2.1 % (ref 0.0–5.0)
HCT: 41.9 % (ref 36.0–46.0)
Hemoglobin: 13.5 g/dL (ref 12.0–15.0)
Lymphocytes Relative: 18.5 % (ref 12.0–46.0)
Lymphs Abs: 2 10*3/uL (ref 0.7–4.0)
MCHC: 32.3 g/dL (ref 30.0–36.0)
MCV: 79.7 fl (ref 78.0–100.0)
Monocytes Absolute: 0.9 10*3/uL (ref 0.1–1.0)
Monocytes Relative: 8 % (ref 3.0–12.0)
Neutro Abs: 7.5 10*3/uL (ref 1.4–7.7)
Neutrophils Relative %: 70.6 % (ref 43.0–77.0)
Platelets: 332 10*3/uL (ref 150.0–400.0)
RBC: 5.26 Mil/uL — ABNORMAL HIGH (ref 3.87–5.11)
RDW: 16.3 % — ABNORMAL HIGH (ref 11.5–15.5)
WBC: 10.6 10*3/uL — ABNORMAL HIGH (ref 4.0–10.5)

## 2022-05-20 LAB — LIPID PANEL
Cholesterol: 141 mg/dL (ref 0–200)
HDL: 64.2 mg/dL (ref >39.00)
LDL Cholesterol: 62 mg/dL (ref 0–99)
NonHDL: 76.6
Total CHOL/HDL Ratio: 2
Triglycerides: 73 mg/dL (ref 0.0–149.0)
VLDL: 14.6 mg/dL (ref 0.0–40.0)

## 2022-05-20 LAB — TSH: TSH: 2.21 u[IU]/mL (ref 0.35–5.50)

## 2022-05-20 LAB — MICROALBUMIN / CREATININE URINE RATIO
Creatinine,U: 219 mg/dL
Microalb Creat Ratio: 0.6 mg/g (ref 0.0–30.0)
Microalb, Ur: 1.2 mg/dL (ref 0.0–1.9)

## 2022-05-20 LAB — FERRITIN: Ferritin: 23 ng/mL (ref 10.0–291.0)

## 2022-05-20 LAB — IBC PANEL
Iron: 49 ug/dL (ref 42–145)
Saturation Ratios: 12.8 % — ABNORMAL LOW (ref 20.0–50.0)
TIBC: 382.2 ug/dL (ref 250.0–450.0)
Transferrin: 273 mg/dL (ref 212.0–360.0)

## 2022-05-20 LAB — VITAMIN B12: Vitamin B-12: 359 pg/mL (ref 211–911)

## 2022-05-20 MED ORDER — CYANOCOBALAMIN 1000 MCG/ML IJ SOLN: 1000.0000 ug | INTRAMUSCULAR | 1 refills | Status: DC

## 2022-05-20 MED ORDER — RIZATRIPTAN BENZOATE 10 MG PO TABS: 10.0000 mg | ORAL_TABLET | ORAL | 8 refills | Status: DC | PRN

## 2022-05-20 MED ORDER — OMEPRAZOLE 40 MG PO CPDR: DELAYED_RELEASE_CAPSULE | ORAL | 2 refills | Status: DC

## 2022-05-20 MED ORDER — ROSUVASTATIN CALCIUM 5 MG PO TABS: ORAL_TABLET | ORAL | 3 refills | Status: DC

## 2022-05-20 MED ORDER — "SYRINGE/NEEDLE (DISP) 25G X 1"" 3 ML MISC": 0 refills | Status: DC

## 2022-05-20 NOTE — Addendum Note (Signed)
Addended by: Marcina Millard on: 05/20/2022 09:58 AM   Modules accepted: Orders

## 2022-05-20 NOTE — Assessment & Plan Note (Signed)
Chronic Does not tolerate oral iron Check iron panel, CBC

## 2022-05-20 NOTE — Assessment & Plan Note (Signed)
Chronic Controlled Continue Cymbalta 60 mg daily Stressed regular exercise, stress reduction

## 2022-05-20 NOTE — Assessment & Plan Note (Signed)
Chronic Controlled, Stable Continue Adderall 30 mg daily

## 2022-05-20 NOTE — Assessment & Plan Note (Signed)
Chronic Doing B12 injections monthly at home Check B12 level

## 2022-05-20 NOTE — Assessment & Plan Note (Signed)
Chronic Regular exercise and healthy diet encouraged Check lipid panel  Continue Crestor 5 mg 2 days a week

## 2022-05-20 NOTE — Assessment & Plan Note (Addendum)
Chronic GERD controlled Continue omeprazole 40 mg daily Consider trying 40 mg alternating with 20 mg

## 2022-05-20 NOTE — Assessment & Plan Note (Signed)
Chronic Controlled, Stable Continue metoprolol XL 25 mg daily

## 2022-05-20 NOTE — Assessment & Plan Note (Signed)
Chronic Management per Dr. Cruzita Lederer  Lab Results  Component Value Date   HGBA1C 7.0 (A) 02/20/2022   Check urine microalbumin today Stressed regular exercise, diabetic diet

## 2022-05-20 NOTE — Assessment & Plan Note (Signed)
Chronic Controlled, Stable Continue Xanax 0.5 mg 3 times daily as needed, duloxetine 60 mg daily

## 2022-05-20 NOTE — Assessment & Plan Note (Addendum)
Chronic Controlled Continue as Maxalt 10 mg daily as needed or over-the-counter Excedrin Migraine

## 2022-05-20 NOTE — Assessment & Plan Note (Signed)
Chronic Controlled, Stable Continue duloxetine 60 mg daily 

## 2022-06-06 ENCOUNTER — Ambulatory Visit (INDEPENDENT_AMBULATORY_CARE_PROVIDER_SITE_OTHER): Payer: No Typology Code available for payment source | Admitting: Orthopedic Surgery

## 2022-06-06 DIAGNOSIS — S83242A Other tear of medial meniscus, current injury, left knee, initial encounter: Secondary | ICD-10-CM | POA: Diagnosis not present

## 2022-06-07 ENCOUNTER — Encounter: Payer: Self-pay | Admitting: Orthopedic Surgery

## 2022-06-07 NOTE — Progress Notes (Signed)
Office Visit Note   Patient: Lisa Duran           Date of Birth: [birth year]           MRN: [id number] Visit Date: 06/06/2022 Requested by: Binnie Rail, MD [place],  [place] [address] PCP: Binnie Rail, MD  Subjective: Chief Complaint  Patient presents with   Left Knee - Follow-up    HPI: Lisa Duran is a 41 y.o. female who presents to the office reporting left knee pain.  She has a known partial radial tear near the root of the meniscus.  She was doing reasonly well with that until 2 weeks ago when she had a twisting injury to the knee where she felt a pop in the medial posterior aspect of the knee and developed immediate pain along the medial aspect of the knee.  Symptoms have been worse since that event.  Reports new clicking in the knee as well.  Denies much in the way of lateral sided pain.  Her MRI scan is reviewed from prior to this most recent injury and it did show a radial tear near the posterior meniscal root of the medial meniscus with some fibers still intact to the meniscal root.  I think that has likely changed with her recent injury..                ROS: All systems reviewed are negative as they relate to the chief complaint within the history of present illness.  Patient denies fevers or chills.  Assessment & Plan: Visit Diagnoses:  1. Acute medial meniscal tear, left, initial encounter     Plan: Impression is left knee pain with likely meniscal root avulsion in a 41 year old patient.  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  No personal or family history of DVT or pulmonary embolism.  After long discussion with family about the risk and benefits of surgical intervention we decided to proceed with arthroscopic evaluation and either meniscal brisement or meniscal root repair.  The expected postop recovery process for both of those interventions are discussed.  The risk and benefits were also discussed including not limited to  infection nerve vessel damage knee stiffness as well as incomplete pain relief and development of arthritis in that knee potentially before she is 41 years old.  Patient understands risk benefits and wishes to proceed.  I do think if her meniscal root is torn then repair could delay her need for knee replacement.  She would like to do that at the beginning of the year if possible.  That can be arranged and in the meantime encouraged her to do nonweightbearing quad strengthening exercises to help with her recovery.  Follow-Up Instructions: No follow-ups on file.   Orders:  No orders of the defined types were placed in this encounter.  No orders of the defined types were placed in this encounter.     Procedures: No procedures performed   Clinical Data: No additional findings.  Objective: Vital Signs: There were no vitals taken for this visit.  Physical Exam:  Constitutional: Patient appears well-developed HEENT:  Head: Normocephalic Eyes:EOM are normal Neck: Normal range of motion Cardiovascular: Normal rate Pulmonary/chest: Effort normal Neurologic: Patient is alert Skin: Skin is warm Psychiatric: Patient has normal mood and affect  Ortho Exam: Ortho exam demonstrates full range of motion of that left knee.  She has pretty good opening of the medial side for possible instrumentation in the back  of the knee.  Has positive McMurray compression testing for medial compartment pathology along with stable collateral cruciate ligaments.  Extensor mechanism intact.  No masses lymphadenopathy or skin changes noted in that left knee region.  Does have medial joint line tenderness but no lateral joint line tenderness.  Specialty Comments:  No specialty comments available.  Imaging: No results found.   PMFS History: Patient Active Problem List   Diagnosis Date Noted   B12 deficiency 05/09/2021   Dyslipidemia 04/14/2019   Enteritis 01/12/2019   Sleep walking disorder 04/06/2017    Iron deficiency anemia 03/27/2017   Fibromyalgia 02/03/2017   GERD (gastroesophageal reflux disease) 06/24/2015   Eczema 06/22/2015   Depression 06/22/2015   Tachycardia 06/16/2014   Food allergy    ADHD (attention deficit hyperactivity disorder) 01/27/2011   Anxiety 02/20/2010   ALLERGIC RHINITIS 11/21/2009   OVARIAN CYST 11/21/2009   Diabetes mellitus type 2 with neurological manifestations ([place]) 11/20/2009   Class 2 obesity in adult 11/20/2009   Migraine without aura 11/20/2009   Past Medical History:  Diagnosis Date   ADHD (attention deficit hyperactivity disorder)    ALLERGIC RHINITIS    Allergy    Anemia    ANXIETY    Depression    DIABETES MELLITUS, TYPE II    Fibromyalgia 2018   GERD (gastroesophageal reflux disease)    MIGRAINE, COMMON    Obesity, unspecified    OVARIAN CYST     Family History  Problem Relation Age of Onset   Diabetes Mother    Hyperlipidemia Mother    CAD Mother    CVA Mother    Diabetes Father    Hyperlipidemia Father    Coronary artery disease Father    Hypertension Father    Cancer Father        esoph   Stroke Father    Cancer Maternal Grandfather 34       Prostate and Lung   Diabetes Sister        Gestational   Graves' disease Maternal Aunt    Cancer Maternal Uncle        Brain   Stroke Paternal Grandmother    Heart disease Paternal Grandmother    Stroke Paternal Grandfather    Heart disease Paternal Grandfather    Diabetes Sister        Gestational and Type II   Hashimoto's thyroiditis Cousin    Cancer Maternal Uncle        Brain    Past Surgical History:  Procedure Laterality Date   NO PAST SURGERIES     Social History   Occupational History   Not on file  Tobacco Use   Smoking status: Former    Types: Cigarettes    Quit date: 08/20/2002    Years since quitting: 19.8   Smokeless tobacco: Never   Tobacco comments:    single, separated from spouse 02/2010.   Vaping Use   Vaping Use: Never used  Substance and  Sexual Activity   Alcohol use: Yes    Comment: couple of drinks a week   Drug use: No   Sexual activity: Not Currently    Birth control/protection: I.U.D.

## 2022-06-12 ENCOUNTER — Other Ambulatory Visit (HOSPITAL_COMMUNITY): Payer: Self-pay

## 2022-06-25 ENCOUNTER — Encounter: Payer: Self-pay | Admitting: Internal Medicine

## 2022-06-25 ENCOUNTER — Other Ambulatory Visit: Payer: Self-pay | Admitting: Internal Medicine

## 2022-06-26 MED ORDER — AMPHETAMINE-DEXTROAMPHETAMINE 30 MG PO TABS: 30.0000 mg | ORAL_TABLET | Freq: Every day | ORAL | 0 refills | Status: DC

## 2022-06-26 MED ORDER — ALPRAZOLAM 0.5 MG PO TABS: ORAL_TABLET | ORAL | 5 refills | Status: DC

## 2022-06-27 ENCOUNTER — Encounter: Payer: Self-pay | Admitting: Internal Medicine

## 2022-06-27 ENCOUNTER — Ambulatory Visit (INDEPENDENT_AMBULATORY_CARE_PROVIDER_SITE_OTHER): Payer: No Typology Code available for payment source | Admitting: Internal Medicine

## 2022-06-27 VITALS — BP 108/80 | HR 105 | Ht 64.0 in | Wt 173.4 lb

## 2022-06-27 DIAGNOSIS — E1149 Type 2 diabetes mellitus with other diabetic neurological complication: Secondary | ICD-10-CM

## 2022-06-27 DIAGNOSIS — E785 Hyperlipidemia, unspecified: Secondary | ICD-10-CM | POA: Diagnosis not present

## 2022-06-27 LAB — POCT GLYCOSYLATED HEMOGLOBIN (HGB A1C): Hemoglobin A1C: 6.9 % — AB (ref 4.0–5.6)

## 2022-06-27 MED ORDER — OZEMPIC (0.25 OR 0.5 MG/DOSE) 2 MG/3ML ~~LOC~~ SOPN: PEN_INJECTOR | SUBCUTANEOUS | 3 refills | Status: DC

## 2022-06-27 NOTE — Addendum Note (Signed)
Addended by: Cinda Quest on: 06/27/2022 04:20 PM   Modules accepted: Orders

## 2022-06-27 NOTE — Patient Instructions (Addendum)
Please increase: - NPH to 10 units 2x a day  Please continue: - Metformin 1000 mg 2x a day with meals - Ozempic 0.5 mg weekly  Use Regular insulin  4-6 units only before large meals.  Please return in 4 months.

## 2022-06-27 NOTE — Progress Notes (Signed)
=ubjective:     Patient ID: Lisa Duran, female   DOB: 11-20-1980, 41 y.o.   MRN: [id number]  HPI Lisa Duran is a pleasant 41 y.o.  woman returning for f/u for DM2, insulin dependent, uncontrolled, dx 40/9811, w/o complications. Last visit 5 months ago.  Interim history: No increased urination, blurry vision, nausea, chest pain.  She has a torn meniscus and will have to have surgery in 2 months. After starting Ozempic, she had constipation, gas, nausea, but these improved.  She continues on Pepcid and Mylanta prn. Before last visit she was able to stop Tums as GERD improved.  No acid reflux now. After starting Ozempic she felt she lost almost 70 pounds and feeling much better.  Since last visit, she 17 pounds.  Reviewed HbA1c levels: Lab Results  Component Value Date   HGBA1C 7.0 (A) 02/20/2022   HGBA1C 6.9 (A) 10/08/2021   HGBA1C 7.6 (A) 07/01/2021   HGBA1C 7.5 (A) 02/21/2021   HGBA1C 7.6 (A) 10/16/2020   HGBA1C 8.6 (A) 06/12/2020   HGBA1C 8.5 (A) 11/07/2019   HGBA1C 8.4 (A) 08/09/2019   HGBA1C 9.1 (A) 04/14/2019   HGBA1C 9.0 (H) 10/20/2018   She is on:   - Metformin 1000 mg 2x a day  - NPH 26-28 units and 22-24 units at bedtime >> 0-12 units 2x a day >> 10 >> 12 >> 6-8 units 2x a day - Regular  8 units before B and L and 8-12 units before D >> 3-6 1-2x a day >> 4-6 units before large meals only - rarely - Ozempic 0.25 >> 0.5 mg weekly - started 07/19/2021 >> 0.25 >> 0.5 mg weekly due to muscle and joint aches We stopped glipizide ER and also Januvia. We tried Cycloset 0.8 mg daily >> stopped end of 11/2016 b/c GERD/AP/C We tried Trulicity >> could not tolerate it: N/V/AP. She restarted Januvia 09/2016. She was on Invokana 100 mg in am (started 12/2014) >> 2 yeast inf >> tx with Diflucan; she was exhausted and had nocturia >> stopped 03/12/2015 Tried Victoza 2 mo ago >> AP, severe GERD, inj site rxn Tried Januvia >> tolerated it well, but stopped when Invokana was suggested.  She  was also on Actos, but taken off b/c good control. She had problems tolerating insulins in the past.  She was previously on Lantus and NovoLog.  Basaglar and [place] caused eczema and also GI symptoms.  She has a [employer], structural.  She checks her sugars more than 4 times a day with her CGM:  Previously:     She has hypoglycemia awareness in the 80s.  No CKD: Lab Results  Component Value Date   BUN 6 05/20/2022   Lab Results  Component Value Date   CREATININE 0.69 05/20/2022   No MAU: Lab Results  Component Value Date   MICRALBCREAT 0.6 05/20/2022   MICRALBCREAT 0.5 05/10/2021   MICRALBCREAT 28 05/09/2020   MICRALBCREAT 0.8 10/20/2018   MICRALBCREAT 0.5 02/03/2017   + HL: Lab Results  Component Value Date   CHOL 141 05/20/2022   HDL 64.20 05/20/2022   LDLCALC 62 05/20/2022   TRIG 73.0 05/20/2022   CHOLHDL 2 05/20/2022  On Crestor 5 mg 2x a week.  - Last eye exam fall 2022: reportedly No DR, IOP high - changed opthalmologists, - prev.[facility].  - Resolved numbness and tingling in hand and feet.  She felt much better on Cymbalta and Neurontin, but she is now off  both -no return of symptoms.  Last foot exam 10/08/2021.  She had CP >> had a stress test 07/04/2015 >>  Normal.  She is on metoprolol for tachycardia. She has fibromyalgia.   She also has a history of iron deficiency anemia and had iron infusions.   In 2019, she just got out of a 5-year relationship.  She was under a lot of stress.   Review of Systems + see HPI  Past Medical History:  Diagnosis Date   ADHD (attention deficit hyperactivity disorder)    ALLERGIC RHINITIS    Allergy    Anemia    ANXIETY    Depression    DIABETES MELLITUS, TYPE II    Fibromyalgia 2018   GERD (gastroesophageal reflux disease)    MIGRAINE, COMMON    Obesity, unspecified    OVARIAN CYST    Past Surgical History:  Procedure Laterality Date   NO PAST SURGERIES     Social History    Socioeconomic History   Marital status: Single    Spouse name: Not on file   Number of children: Not on file   Years of education: Not on file   Highest education level: Not on file  Occupational History   Not on file  Tobacco Use   Smoking status: Former    Types: Cigarettes    Quit date: 08/20/2002    Years since quitting: 19.8   Smokeless tobacco: Never   Tobacco comments:    single, separated from spouse 02/2010.   Vaping Use   Vaping Use: Never used  Substance and Sexual Activity   Alcohol use: Yes    Comment: couple of drinks a week   Drug use: No   Sexual activity: Not Currently    Birth control/protection: I.U.D.  Other Topics Concern   Not on file  Social History Narrative   Single, separated from spouse 02/2010   Social Determinants of Health   Financial Resource Strain: Not on file  Food Insecurity: Not on file  Transportation Needs: Not on file  Physical Activity: Not on file  Stress: Not on file  Social Connections: Not on file  Intimate Partner Violence: Not on file   Current Outpatient Medications on File Prior to Visit  Medication Sig Dispense Refill   ALPRAZolam (XANAX) 0.5 MG tablet TAKE 1 TABLET(0.5 MG) BY MOUTH THREE TIMES DAILY AS NEEDED FOR ANXIETY 60 tablet 5   amphetamine-dextroamphetamine (ADDERALL) 30 MG tablet Take 1 tablet by mouth daily. 60 tablet 0   Continuous Blood Gluc Receiver ([place]) DEVI by Does not apply route.     Continuous Blood Gluc Sensor (DEXCOM G6 SENSOR) MISC by Does not apply route.     Continuous Blood Gluc Transmit (DEXCOM G6 TRANSMITTER) MISC by Does not apply route.     cyanocobalamin (VITAMIN B12) 1000 MCG/ML injection Inject 1 mL (1,000 mcg total) into the muscle every 30 (thirty) days. 10 mL 1   DULoxetine (CYMBALTA) 60 MG capsule Take 1 capsule (60 mg total) by mouth daily. 90 capsule 2   EPINEPHrine 0.3 mg/0.3 mL IJ SOAJ injection      Fexofenadine HCl (ALLEGRA ALLERGY PO) Take by mouth.     glucose  blood (ONETOUCH VERIO) test strip Use 2x a day 200 each 3   Insulin NPH, Human,, Isophane, (HUMULIN N KWIKPEN) 100 UNIT/ML Kiwkpen Inject 10-12 Units into the skin 2 (two) times daily. 30 mL 3   insulin regular (HUMULIN R) 100 units/mL injection INJECT8-10 UNITS INTO THE  SKIN THREE TIMES DAILY BEFORE MEALS. DISCARD OPEN VIAL AFTER 28 DAYS.     Insulin Syringe-Needle U-100 (B-D INS SYR ULTRAFINE .5CC/30G) 30G X 1/2" 0.5 ML MISC Use to inject insulin once daily. 100 each 11   levonorgestrel (MIRENA) 20 MCG/24HR IUD 1 Intra Uterine Device (1 each total) by Intrauterine route once. 1 each 0   metFORMIN (GLUCOPHAGE) 1000 MG tablet TAKE 2 TABLETS BY MOUTH DAILY WITH SUPPER. TAKE 1 TABLET BY MOUTH TWICE A DAY WITH A MEAL 180 tablet 3   metoprolol succinate (TOPROL-XL) 25 MG 24 hr tablet Take 1 tablet (25 mg total) by mouth daily. 90 tablet 1   omeprazole (PRILOSEC) 40 MG capsule TAKE 1 CAPULE BY MOUTH BEFORE SUPPER DAILY 90 capsule 2   ondansetron (ZOFRAN ODT) 4 MG disintegrating tablet Take 1 tablet (4 mg total) by mouth every 8 (eight) hours as needed for nausea or vomiting. 20 tablet 0   OZEMPIC, 0.25 OR 0.5 MG/DOSE, 2 MG/3ML SOPN INJECT 0.'5MG'$  UNDER THE SKIN ONCE A WEEK 3 mL 1   promethazine (PHENERGAN) 25 MG tablet Take 1 tablet (25 mg total) by mouth every 6 (six) hours as needed for nausea. 15 tablet 0   rizatriptan (MAXALT) 10 MG tablet Take 1 tablet (10 mg total) by mouth as needed for migraine. May repeat in 2 hours if needed 10 tablet 8   rosuvastatin (CRESTOR) 5 MG tablet TAKE 1 TABLET(5 MG TOTAL) BY MOUTH 2(TWO) TIMES A WEEK 13 tablet 3   SYRINGE-NEEDLE, DISP, 3 ML 25G X 1" 3 ML MISC Use for monthly B12 injections 12 each 0   triamcinolone cream (KENALOG) 0.1 % Apply 1 application topically 2 (two) times daily as needed (for itching). 28.4 g 1   No current facility-administered medications on file prior to visit.   Allergies  Allergen Reactions   Chicken Allergy Anaphylaxis and Shortness Of  Breath   Covid-19 Mrna Vacc (Moderna) Anaphylaxis   Other Itching, Swelling and Other (See Comments)    NUTS (of ANY kind): Lips and mouth swell, but no breathing impairment & migraines and eczema are triggered   Propofol Shortness Of Breath    SOB, chest tightness, wheezing after colonoscopy on 09-18-15   Basaglar Kwikpen [Insulin Glargine] Palpitations and Hypertension   Adhesive [Tape] Other (See Comments)    Makes the skin VERY RED    Gluten Meal Diarrhea   Peanut-Containing Drug Products Itching, Swelling and Other (See Comments)    Lips and mouth swell, but no breathing impairment & migraines and eczema are triggered    Novolog [Insulin Aspart] Rash   Tyler Aas [Insulin Degludec] Rash    eczema   Victoza [Liraglutide] Rash   Family History  Problem Relation Age of Onset   Diabetes Mother    Hyperlipidemia Mother    CAD Mother    CVA Mother    Diabetes Father    Hyperlipidemia Father    Coronary artery disease Father    Hypertension Father    Cancer Father        esoph   Stroke Father    Cancer Maternal Grandfather 41       Prostate and Lung   Diabetes Sister        Gestational   Graves' disease Maternal Aunt    Cancer Maternal Uncle        Brain   Stroke Paternal Grandmother    Heart disease Paternal Grandmother    Stroke Paternal Grandfather    Heart disease Paternal Grandfather  Diabetes Sister        Gestational and Type II   Hashimoto's thyroiditis Cousin    Cancer Maternal Uncle        Brain   Objective:   Physical Exam BP 108/80   Pulse (!) 105   Ht '5\' 4"'$  (1.626 m)   Wt 173 lb 6.4 oz (78.7 kg)   SpO2 98%   BMI 29.76 kg/m   Wt Readings from Last 3 Encounters:  06/27/22 173 lb 6.4 oz (78.7 kg)  05/20/22 178 lb (80.7 kg)  04/08/22 181 lb (82.1 kg)   Constitutional: overweight, in NAD Eyes: EOMI, no exophthalmos ENT: no thyromegaly, no cervical lymphadenopathy Cardiovascular: RRR, No MRG Respiratory: CTA B Musculoskeletal: no  deformities Skin: moist, warm, no rashes Neurological: no tremor with outstretched hands  Assessment:     1. DM2, insulin-dependent, uncontrolled, without complications - r/o autoimmunity Component     Latest Ref Rng 08/17/2012  Glutamic Acid Decarb Ab     <=1.0 U/mL <1.0  Pancreatic Islet Cell Antibody     < 5 JDF Units <5  C-Peptide     0.80 - 3.90 ng/mL 1.84  Glucose     70 - 99 mg/dL 104 (H)     2. Overweight BMI Classification: < 18.5 underweight  18.5-24.9 normal weight  25.0-29.9 overweight  30.0-34.9 class I obesity  35.0-39.9 class II obesity  ? 40.0 class III obesity   3. HL  Plan:     1. Patient with longstanding, uncontrolled, type 2 diabetes, on metformin, basal/bolus insulin and weekly GLP-1 receptor agonist, with dramatic improvement of control after starting Ozempic.  At last visit, HbA1c was 7.0%, slightly higher, but still at goal.  At that time, we discussed about using regular insulin only before larger meals and we continued her metformin, Ozempic, and NPH insulin. CGM interpretation: -At today's visit, we reviewed her CGM downloads: It appears that 92% of values are in target range (goal >70%), while 8% are higher than 180 (goal <25%), and 0% are lower than 70 (goal <4%).  The calculated average blood sugar is 140.  The projected HbA1c for the next 3 months (GMI) is 6.7%. -Reviewing the CGM trends, sugars appear to be fluctuating in the middle of the target range or slightly above.  She does have higher blood sugars in the morning, and upon questioning, this is due to drinking coffee with creamer.  She did switch from unsweetened to sweetened creamer since last visit.  We discussed about possibly switching to adding almond milk to the coffee, but she cannot eat nuts.  We discussed about possibly switching back to the unsweetened creamer, if she absolutely has to have this.  In the meantime, since sugars are slightly higher than target, I did advise her to  increase the NPH slightly at both times of the day when she takes it.  I advised her to try to decrease it back after the holidays. - I suggested to:  Patient Instructions  Please increase: - NPH to 10 units 2x a day  Please continue: - Metformin 1000 mg 2x a day with meals - Ozempic 0.5 mg weekly  Use Regular insulin  4-6 units only before large meals.  Please return in 4 months.  - we checked her HbA1c: 6.9% (lower) - advised to check sugars at different times of the day - 4x a day, rotating check times - advised for yearly eye exams >> she is UTD - return to clinic in 3-4 months  2. Overweight -out of the obesity category now -We started Ozempic, initially poorly tolerated but she decided to continue with it.  Her symptoms abated. -She lost approximately 50 pounds after starting Ozempic (of which 19 before last visit).  She felt much better afterwards and her pulse normalized. - she lost 17 more lbs since last OV  3. HL -Reviewed latest lipid panel from 05/2022: All fractions at goal: Lab Results  Component Value Date   CHOL 141 05/20/2022   HDL 64.20 05/20/2022   LDLCALC 62 05/20/2022   TRIG 73.0 05/20/2022   CHOLHDL 2 05/20/2022  -Continues on Crestor 5 mg twice a week.  She does have muscle and joint aches but she does not feel that these are related to the statin, but to her fibromyalgia.  Philemon Kingdom, MD PhD [facility]

## 2022-06-28 MED ORDER — AMPHETAMINE-DEXTROAMPHETAMINE 30 MG PO TABS: 30.0000 mg | ORAL_TABLET | Freq: Every day | ORAL | 0 refills | Status: DC

## 2022-07-08 ENCOUNTER — Telehealth: Payer: Self-pay

## 2022-07-08 ENCOUNTER — Other Ambulatory Visit (HOSPITAL_COMMUNITY): Payer: Self-pay

## 2022-07-08 NOTE — Telephone Encounter (Signed)
Pharmacy Patient Advocate Encounter   Received notification from [facility] that prior authorization for Ozempic (0.25 or 0.5 MG/DOSE) '2MG'$ /3ML pen-injectors  is required/requested.  Per Test Claim: Refill too soon   Called pharmacy to confirm, prescription has been filled and picked up by patient. PA not needed

## 2022-07-10 ENCOUNTER — Encounter: Payer: Self-pay | Admitting: Internal Medicine

## 2022-07-10 DIAGNOSIS — E1149 Type 2 diabetes mellitus with other diabetic neurological complication: Secondary | ICD-10-CM

## 2022-07-10 MED ORDER — INSULIN PEN NEEDLE 32G X 4 MM MISC: 1 refills | Status: DC

## 2022-07-14 ENCOUNTER — Other Ambulatory Visit (INDEPENDENT_AMBULATORY_CARE_PROVIDER_SITE_OTHER): Payer: No Typology Code available for payment source

## 2022-07-14 ENCOUNTER — Telehealth: Payer: Self-pay

## 2022-07-14 ENCOUNTER — Encounter: Payer: Self-pay | Admitting: Internal Medicine

## 2022-07-14 DIAGNOSIS — R300 Dysuria: Secondary | ICD-10-CM | POA: Diagnosis not present

## 2022-07-14 LAB — URINALYSIS, ROUTINE W REFLEX MICROSCOPIC

## 2022-07-14 MED ORDER — NITROFURANTOIN MONOHYD MACRO 100 MG PO CAPS: 100.0000 mg | ORAL_CAPSULE | Freq: Two times a day (BID) | ORAL | 0 refills | Status: DC

## 2022-07-14 NOTE — Telephone Encounter (Signed)
Spoke with patient and wanted to be seen today due to a urgent UTI Dr burns had no availability and patient did not want to wait until the 6th advised patient to go to urgent care or a fast med to be seen right away

## 2022-07-14 NOTE — Telephone Encounter (Signed)
UA, urine culture ordered-please have her come in as soon as possible.

## 2022-07-14 NOTE — Telephone Encounter (Signed)
Patient coming in this afternoon for lab

## 2022-07-15 MED ORDER — FLUCONAZOLE 150 MG PO TABS: 150.0000 mg | ORAL_TABLET | Freq: Once | ORAL | 0 refills | Status: AC

## 2022-07-17 LAB — URINE CULTURE

## 2022-07-22 ENCOUNTER — Encounter: Payer: Self-pay | Admitting: Internal Medicine

## 2022-08-12 ENCOUNTER — Encounter: Payer: Self-pay | Admitting: Hematology

## 2022-08-12 ENCOUNTER — Encounter: Payer: Self-pay | Admitting: Internal Medicine

## 2022-08-13 ENCOUNTER — Encounter: Payer: Self-pay | Admitting: Hematology

## 2022-08-13 ENCOUNTER — Telehealth: Payer: 59 | Admitting: Nurse Practitioner

## 2022-08-13 DIAGNOSIS — T3695XA Adverse effect of unspecified systemic antibiotic, initial encounter: Secondary | ICD-10-CM | POA: Diagnosis not present

## 2022-08-13 DIAGNOSIS — B379 Candidiasis, unspecified: Secondary | ICD-10-CM

## 2022-08-13 DIAGNOSIS — N3000 Acute cystitis without hematuria: Secondary | ICD-10-CM

## 2022-08-13 MED ORDER — FLUCONAZOLE 150 MG PO TABS: 150.0000 mg | ORAL_TABLET | Freq: Once | ORAL | 0 refills | Status: AC

## 2022-08-13 MED ORDER — SULFAMETHOXAZOLE-TRIMETHOPRIM 800-160 MG PO TABS: 1.0000 | ORAL_TABLET | Freq: Two times a day (BID) | ORAL | 0 refills | Status: AC

## 2022-08-13 NOTE — Progress Notes (Signed)
Virtual Visit Consent   Lisa Duran, you are scheduled for a virtual visit with a [facility] provider today. Just as with appointments in the office, your consent must be obtained to participate. Your consent will be active for this visit and any virtual visit you may have with one of our providers in the next 365 days. If you have a MyChart account, a copy of this consent can be sent to you electronically.  As this is a virtual visit, video technology does not allow for your provider to perform a traditional examination. This may limit your provider's ability to fully assess your condition. If your provider identifies any concerns that need to be evaluated in person or the need to arrange testing (such as labs, EKG, etc.), we will make arrangements to do so. Although advances in technology are sophisticated, we cannot ensure that it will always work on either your end or our end. If the connection with a video visit is poor, the visit may have to be switched to a telephone visit. With either a video or telephone visit, we are not always able to ensure that we have a secure connection.  By engaging in this virtual visit, you consent to the provision of healthcare and authorize for your insurance to be billed (if applicable) for the services provided during this visit. Depending on your insurance coverage, you may receive a charge related to this service.  I need to obtain your verbal consent now. Are you willing to proceed with your visit today? Lisa Duran has provided verbal consent on 08/13/2022 for a virtual visit (video or telephone). Apolonio Schneiders, FNP  Date: 08/13/2022 12:59 PM  Virtual Visit via Video Note   I, Apolonio Schneiders, connected with  Lisa Duran  ([id number], 1981-02-19) on 08/13/22 at  1:00 PM EST by a video-enabled telemedicine application and verified that I am speaking with the correct person using two identifiers.  Location: Patient: Virtual Visit Location Patient: Home Provider:  Virtual Visit Location Provider: Home Office   I discussed the limitations of evaluation and management by telemedicine and the availability of in person appointments. The patient expressed understanding and agreed to proceed.    History of Present Illness: Lisa Duran is a 42 y.o. who identifies as a female who was assigned female at birth, and is being seen today for UTI symptoms.  She has been experiencing symptoms of pain with urination and increased frequency.   She had a UTI at the beginning of December. Urine culture returned with E. Coli and resolved well with Macrobid culture on file  Recent Results (from the past 2160 hour(s))  Ferritin     Status: None   Collection Time: 05/20/22  9:13 AM  Result Value Ref Range   Ferritin 23.0 10.0 - 291.0 ng/mL  IBC panel     Status: Abnormal   Collection Time: 05/20/22  9:13 AM  Result Value Ref Range   Iron 49 42 - 145 ug/dL   Transferrin 273.0 212.0 - 360.0 mg/dL   Saturation Ratios 12.8 (L) 20.0 - 50.0 %   TIBC 382.2 250.0 - 450.0 mcg/dL  Microalbumin / creatinine urine ratio     Status: None   Collection Time: 05/20/22  9:13 AM  Result Value Ref Range   Microalb, Ur 1.2 0.0 - 1.9 mg/dL   Creatinine,U 219.0 mg/dL   Microalb Creat Ratio 0.6 0.0 - 30.0 mg/g  Vitamin B12     Status: None   Collection Time: 05/20/22  9:13  AM  Result Value Ref Range   Vitamin B-12 359 211 - 911 pg/mL  TSH     Status: None   Collection Time: 05/20/22  9:13 AM  Result Value Ref Range   TSH 2.21 0.35 - 5.50 uIU/mL  Lipid panel     Status: None   Collection Time: 05/20/22  9:13 AM  Result Value Ref Range   Cholesterol 141 0 - 200 mg/dL    Comment: ATP III Classification       Desirable:  < 200 mg/dL               Borderline High:  200 - 239 mg/dL          High:  > = 240 mg/dL   Triglycerides 73.0 0.0 - 149.0 mg/dL    Comment: Normal:  <150 mg/dLBorderline High:  150 - 199 mg/dL   HDL 64.20 >39.00 mg/dL   VLDL 14.6 0.0 - 40.0 mg/dL   LDL  Cholesterol 62 0 - 99 mg/dL   Total CHOL/HDL Ratio 2     Comment:                Men          Women1/2 Average Risk     3.4          3.3Average Risk          5.0          4.42X Average Risk          9.6          7.13X Average Risk          15.0          11.0                       NonHDL 76.60     Comment: NOTE:  Non-HDL goal should be 30 mg/dL higher than patient's LDL goal (i.e. LDL goal of < 70 mg/dL, would have non-HDL goal of < 100 mg/dL)  CBC with Differential/Platelet     Status: Abnormal   Collection Time: 05/20/22  9:13 AM  Result Value Ref Range   WBC 10.6 (H) 4.0 - 10.5 K/uL   RBC 5.26 (H) 3.87 - 5.11 Mil/uL   Hemoglobin 13.5 12.0 - 15.0 g/dL   HCT 41.9 36.0 - 46.0 %   MCV 79.7 78.0 - 100.0 fl   MCHC 32.3 30.0 - 36.0 g/dL   RDW 16.3 (H) 11.5 - 15.5 %   Platelets 332.0 150.0 - 400.0 K/uL   Neutrophils Relative % 70.6 43.0 - 77.0 %   Lymphocytes Relative 18.5 12.0 - 46.0 %   Monocytes Relative 8.0 3.0 - 12.0 %   Eosinophils Relative 2.1 0.0 - 5.0 %   Basophils Relative 0.8 0.0 - 3.0 %   Neutro Abs 7.5 1.4 - 7.7 K/uL   Lymphs Abs 2.0 0.7 - 4.0 K/uL   Monocytes Absolute 0.9 0.1 - 1.0 K/uL   Eosinophils Absolute 0.2 0.0 - 0.7 K/uL   Basophils Absolute 0.1 0.0 - 0.1 K/uL  Comprehensive metabolic panel     Status: Abnormal   Collection Time: 05/20/22  9:13 AM  Result Value Ref Range   Sodium 136 135 - 145 mEq/L   Potassium 4.2 3.5 - 5.1 mEq/L   Chloride 102 96 - 112 mEq/L   CO2 23 19 - 32 mEq/L   Glucose, Bld 135 (H) 70 - 99 mg/dL   BUN 6  6 - 23 mg/dL   Creatinine, Ser 0.69 0.40 - 1.20 mg/dL   Total Bilirubin 0.5 0.2 - 1.2 mg/dL   Alkaline Phosphatase 91 39 - 117 U/L   AST 12 0 - 37 U/L   ALT 10 0 - 35 U/L   Total Protein 7.7 6.0 - 8.3 g/dL   Albumin 4.4 3.5 - 5.2 g/dL   GFR 106.15 >60.00 mL/min    Comment: Calculated using the CKD-EPI Creatinine Equation (2021)   Calcium 9.6 8.4 - 10.5 mg/dL  POCT HgB A1C     Status: Abnormal   Collection Time: 06/27/22  4:20 PM   Result Value Ref Range   Hemoglobin A1C 6.9 (A) 4.0 - 5.6 %   HbA1c POC (<> result, manual entry)     HbA1c, POC (prediabetic range)     HbA1c, POC (controlled diabetic range)    Urine Culture     Status: Abnormal   Collection Time: 07/14/22  2:14 PM   Specimen: Urine  Result Value Ref Range   Source: URINE    Status: FINAL    Isolate 1: Escherichia coli (A)     Comment: Greater than 100,000 CFU/mL of Escherichia coli      Susceptibility   Escherichia coli - URINE CULTURE, REFLEX    AMOX/CLAVULANIC 4 Sensitive     AMPICILLIN 8 Sensitive     AMPICILLIN/SULBACTAM 4 Sensitive     CEFAZOLIN* <=4 Not Reportable      * For infections other than uncomplicated UTI caused by E. coli, K. pneumoniae or P. mirabilis: Cefazolin is resistant if MIC > or = 8 mcg/mL. (Distinguishing susceptible versus intermediate for isolates with MIC < or = 4 mcg/mL requires additional testing.) For uncomplicated UTI caused by E. coli, K. pneumoniae or P. mirabilis: Cefazolin is susceptible if MIC <32 mcg/mL and predicts susceptible to the oral agents cefaclor, cefdinir, cefpodoxime, cefprozil, cefuroxime, cephalexin and loracarbef.     CEFTAZIDIME <=1 Sensitive     CEFEPIME <=1 Sensitive     CEFTRIAXONE <=1 Sensitive     CIPROFLOXACIN <=0.25 Sensitive     LEVOFLOXACIN <=0.12 Sensitive     GENTAMICIN <=1 Sensitive     IMIPENEM <=0.25 Sensitive     NITROFURANTOIN <=16 Sensitive     PIP/TAZO <=4 Sensitive     TOBRAMYCIN <=1 Sensitive     TRIMETH/SULFA* <=20 Sensitive      * For infections other than uncomplicated UTI caused by E. coli, K. pneumoniae or P. mirabilis: Cefazolin is resistant if MIC > or = 8 mcg/mL. (Distinguishing susceptible versus intermediate for isolates with MIC < or = 4 mcg/mL requires additional testing.) For uncomplicated UTI caused by E. coli, K. pneumoniae or P. mirabilis: Cefazolin is susceptible if MIC <32 mcg/mL and predicts susceptible to the oral agents cefaclor,  cefdinir, cefpodoxime, cefprozil, cefuroxime, cephalexin and loracarbef. Legend: S = Susceptible  I = Intermediate R = Resistant  NS = Not susceptible * = Not tested  NR = Not reported **NN = See antimicrobic comments   Urinalysis, Routine w reflex microscopic     Status: Abnormal   Collection Time: 07/14/22  2:14 PM  Result Value Ref Range   Color, Urine Dark Orange (A) Yellow;Lt. Yellow;Straw;Dark Yellow;Amber;Green;Red;Brown   APPearance Sl Cloudy (A) Clear;Turbid;Slightly Cloudy;Cloudy   Specific Gravity, Urine Color Interference (A) 1.000 - 1.030   pH Color Interference (A) 5.0 - 8.0   Total Protein, Urine Color Interference (A) Negative   Urine Glucose Color Interference (A) Negative  Ketones, ur Color Interference (A) Negative   Bilirubin Urine Color Interference (A) Negative   Hgb urine dipstick Color Interference (A) Negative   Urobilinogen, UA Color Interference (A) 0.0 - 1.0   Leukocytes,Ua Color Interference (A) Negative   Nitrite Color Interference (A) Negative   WBC, UA 21-50/hpf (A) 0-2/hpf   RBC / HPF TNTC(>50/hpf) (A) 0-2/hpf   Mucus, UA Presence of (A) None   Squamous Epithelial / LPF Rare(0-4/hpf) Rare(0-4/hpf)   Bacteria, UA Few(10-50/hpf) (A) None      Problems:  Patient Active Problem List   Diagnosis Date Noted   B12 deficiency 05/09/2021   Dyslipidemia 04/14/2019   Enteritis 01/12/2019   Sleep walking disorder 04/06/2017   Iron deficiency anemia 03/27/2017   Fibromyalgia 02/03/2017   GERD (gastroesophageal reflux disease) 06/24/2015   Eczema 06/22/2015   Depression 06/22/2015   Tachycardia 06/16/2014   Food allergy    ADHD (attention deficit hyperactivity disorder) 01/27/2011   Anxiety 02/20/2010   ALLERGIC RHINITIS 11/21/2009   OVARIAN CYST 11/21/2009   Diabetes mellitus type 2 with neurological manifestations (Ages) 11/20/2009   Class 2 obesity in adult 11/20/2009   Migraine without aura 11/20/2009    Allergies:  Allergies  Allergen  Reactions   Chicken Allergy Anaphylaxis and Shortness Of Breath   Covid-19 Mrna Vacc (Moderna) Anaphylaxis   Other Itching, Swelling and Other (See Comments)    NUTS (of ANY kind): Lips and mouth swell, but no breathing impairment & migraines and eczema are triggered   Propofol Shortness Of Breath    SOB, chest tightness, wheezing after colonoscopy on 09-18-15   Basaglar Kwikpen [Insulin Glargine] Palpitations and Hypertension   Adhesive [Tape] Other (See Comments)    Makes the skin VERY RED    Gluten Meal Diarrhea   Peanut-Containing Drug Products Itching, Swelling and Other (See Comments)    Lips and mouth swell, but no breathing impairment & migraines and eczema are triggered    Novolog [Insulin Aspart] Rash   Tyler Aas [Insulin Degludec] Rash    eczema   Victoza [Liraglutide] Rash   Medications:  Current Outpatient Medications:    ALPRAZolam (XANAX) 0.5 MG tablet, TAKE 1 TABLET(0.5 MG) BY MOUTH THREE TIMES DAILY AS NEEDED FOR ANXIETY, Disp: 60 tablet, Rfl: 5   amphetamine-dextroamphetamine (ADDERALL) 30 MG tablet, Take 1 tablet by mouth daily., Disp: 30 tablet, Rfl: 0   Continuous Blood Gluc Receiver ([place]) DEVI, by Does not apply route., Disp: , Rfl:    Continuous Blood Gluc Sensor (DEXCOM G6 SENSOR) MISC, by Does not apply route., Disp: , Rfl:    Continuous Blood Gluc Transmit (DEXCOM G6 TRANSMITTER) MISC, by Does not apply route., Disp: , Rfl:    cyanocobalamin (VITAMIN B12) 1000 MCG/ML injection, Inject 1 mL (1,000 mcg total) into the muscle every 30 (thirty) days., Disp: 10 mL, Rfl: 1   DULoxetine (CYMBALTA) 60 MG capsule, Take 1 capsule (60 mg total) by mouth daily., Disp: 90 capsule, Rfl: 2   EPINEPHrine 0.3 mg/0.3 mL IJ SOAJ injection, , Disp: , Rfl:    Fexofenadine HCl (ALLEGRA ALLERGY PO), Take by mouth., Disp: , Rfl:    glucose blood (ONETOUCH VERIO) test strip, Use 2x a day, Disp: 200 each, Rfl: 3   Insulin NPH, Human,, Isophane, (HUMULIN N KWIKPEN) 100  UNIT/ML Kiwkpen, Inject 10-12 Units into the skin 2 (two) times daily., Disp: 30 mL, Rfl: 3   Insulin Pen Needle 32G X 4 MM MISC, Use as instructed to administer injections 3X daily,  Disp: 200 each, Rfl: 1   insulin regular (HUMULIN R) 100 units/mL injection, INJECT8-10 UNITS INTO THE SKIN THREE TIMES DAILY BEFORE MEALS. DISCARD OPEN VIAL AFTER 28 DAYS., Disp: , Rfl:    Insulin Syringe-Needle U-100 (B-D INS SYR ULTRAFINE .5CC/30G) 30G X 1/2" 0.5 ML MISC, Use to inject insulin once daily., Disp: 100 each, Rfl: 11   levonorgestrel (MIRENA) 20 MCG/24HR IUD, 1 Intra Uterine Device (1 each total) by Intrauterine route once., Disp: 1 each, Rfl: 0   metFORMIN (GLUCOPHAGE) 1000 MG tablet, TAKE 2 TABLETS BY MOUTH DAILY WITH SUPPER. TAKE 1 TABLET BY MOUTH TWICE A DAY WITH A MEAL, Disp: 180 tablet, Rfl: 3   metoprolol succinate (TOPROL-XL) 25 MG 24 hr tablet, Take 1 tablet (25 mg total) by mouth daily., Disp: 90 tablet, Rfl: 1   nitrofurantoin, macrocrystal-monohydrate, (MACROBID) 100 MG capsule, Take 1 capsule (100 mg total) by mouth 2 (two) times daily., Disp: 14 capsule, Rfl: 0   omeprazole (PRILOSEC) 40 MG capsule, TAKE 1 CAPULE BY MOUTH BEFORE SUPPER DAILY, Disp: 90 capsule, Rfl: 2   ondansetron (ZOFRAN ODT) 4 MG disintegrating tablet, Take 1 tablet (4 mg total) by mouth every 8 (eight) hours as needed for nausea or vomiting., Disp: 20 tablet, Rfl: 0   promethazine (PHENERGAN) 25 MG tablet, Take 1 tablet (25 mg total) by mouth every 6 (six) hours as needed for nausea., Disp: 15 tablet, Rfl: 0   rizatriptan (MAXALT) 10 MG tablet, Take 1 tablet (10 mg total) by mouth as needed for migraine. May repeat in 2 hours if needed, Disp: 10 tablet, Rfl: 8   rosuvastatin (CRESTOR) 5 MG tablet, TAKE 1 TABLET(5 MG TOTAL) BY MOUTH 2(TWO) TIMES A WEEK, Disp: 13 tablet, Rfl: 3   Semaglutide,0.25 or 0.'5MG'$ /DOS, (OZEMPIC, 0.25 OR 0.5 MG/DOSE,) 2 MG/3ML SOPN, INJECT 0.'5MG'$  UNDER THE SKIN ONCE A WEEK, Disp: 9 mL, Rfl: 3    SYRINGE-NEEDLE, DISP, 3 ML 25G X 1" 3 ML MISC, Use for monthly B12 injections, Disp: 12 each, Rfl: 0   triamcinolone cream (KENALOG) 0.1 %, Apply 1 application topically 2 (two) times daily as needed (for itching)., Disp: 28.4 g, Rfl: 1  Observations/Objective: Patient is well-developed, well-nourished in no acute distress.  Resting comfortably  at home.  Head is normocephalic, atraumatic.  No labored breathing.  Speech is clear and coherent with logical content.  Patient is alert and oriented at baseline.    Assessment and Plan: 1. Acute cystitis without hematuria  - sulfamethoxazole-trimethoprim (BACTRIM DS) 800-160 MG tablet; Take 1 tablet by mouth 2 (two) times daily for 5 days.  Dispense: 10 tablet; Refill: 0  2. Antibiotic-induced yeast infection  - fluconazole (DIFLUCAN) 150 MG tablet; Take 1 tablet (150 mg total) by mouth once for 1 dose.  Dispense: 1 tablet; Refill: 0     Follow Up Instructions: I discussed the assessment and treatment plan with the patient. The patient was provided an opportunity to ask questions and all were answered. The patient agreed with the plan and demonstrated an understanding of the instructions.  A copy of instructions were sent to the patient via MyChart unless otherwise noted below.   The patient was advised to call back or seek an in-person evaluation if the symptoms worsen or if the condition fails to improve as anticipated.  Time:  I spent 10 minutes with the patient via telehealth technology discussing the above problems/concerns.    Apolonio Schneiders, FNP

## 2022-08-15 ENCOUNTER — Telehealth: Payer: Self-pay | Admitting: Orthopedic Surgery

## 2022-08-15 NOTE — Telephone Encounter (Signed)
Pt submitted medical release form, and $25.00 cash payment to [employer] for short term disability, Pt states short term form where faxed. Payment made 08/15/22

## 2022-08-22 ENCOUNTER — Other Ambulatory Visit: Payer: Self-pay | Admitting: Internal Medicine

## 2022-08-25 HISTORY — PX: KNEE ARTHROSCOPY: SHX127

## 2022-09-01 ENCOUNTER — Other Ambulatory Visit: Payer: Self-pay | Admitting: Surgical

## 2022-09-01 ENCOUNTER — Encounter: Payer: Self-pay | Admitting: Orthopedic Surgery

## 2022-09-01 DIAGNOSIS — S83242A Other tear of medial meniscus, current injury, left knee, initial encounter: Secondary | ICD-10-CM

## 2022-09-01 MED ORDER — CELECOXIB 100 MG PO CAPS: 100.0000 mg | ORAL_CAPSULE | Freq: Two times a day (BID) | ORAL | 0 refills | Status: DC

## 2022-09-01 MED ORDER — ASPIRIN 81 MG PO CHEW: 81.0000 mg | CHEWABLE_TABLET | Freq: Two times a day (BID) | ORAL | 0 refills | Status: DC

## 2022-09-01 MED ORDER — METHOCARBAMOL 500 MG PO TABS: 500.0000 mg | ORAL_TABLET | Freq: Three times a day (TID) | ORAL | 1 refills | Status: DC | PRN

## 2022-09-01 MED ORDER — OXYCODONE HCL 5 MG PO TABS: 5.0000 mg | ORAL_TABLET | ORAL | 0 refills | Status: DC | PRN

## 2022-09-03 ENCOUNTER — Encounter: Payer: No Typology Code available for payment source | Admitting: Orthopedic Surgery

## 2022-09-09 ENCOUNTER — Telehealth: Payer: Self-pay | Admitting: Orthopedic Surgery

## 2022-09-09 NOTE — Telephone Encounter (Signed)
[employer] forms received. To [employer]. 

## 2022-09-10 ENCOUNTER — Encounter: Payer: Self-pay | Admitting: Internal Medicine

## 2022-09-10 ENCOUNTER — Telehealth: Payer: Self-pay

## 2022-09-10 ENCOUNTER — Ambulatory Visit (INDEPENDENT_AMBULATORY_CARE_PROVIDER_SITE_OTHER): Payer: 59 | Admitting: Surgical

## 2022-09-10 DIAGNOSIS — Z9889 Other specified postprocedural states: Secondary | ICD-10-CM

## 2022-09-10 NOTE — Telephone Encounter (Signed)
Patient needs CPM Continue NWB CPM 0-30 with gradual increase up to 90 by next appt

## 2022-09-10 NOTE — Telephone Encounter (Signed)
Order sent

## 2022-09-11 ENCOUNTER — Encounter: Payer: Self-pay | Admitting: Orthopedic Surgery

## 2022-09-11 MED ORDER — DEXMETHYLPHENIDATE HCL 10 MG PO TABS: 20.0000 mg | ORAL_TABLET | Freq: Two times a day (BID) | ORAL | 0 refills | Status: DC

## 2022-09-11 NOTE — Progress Notes (Addendum)
Post-Op Visit Note   Patient: Lisa Duran           Date of Birth: 03/11/1981           MRN: [id number] Visit Date: 09/10/2022 PCP: Lisa Sanes, MD   Assessment & Plan:  Chief Complaint:  Chief Complaint  Patient presents with   Left Knee - Routine Post Op    L KNEE SCOPE (surgery date 08-25-22)   Visit Diagnoses: No diagnosis found.  Plan: Patient is a 42 year old female who presents s/p left knee arthroscopy with medial meniscal root repair on 09/01/2022.  She is doing well overall and states she has minimal pain, just occasional discomfort.  She denies any calf pain consistently, chest pain, shortness of breath.  She is taking aspirin for DVT prophylaxis.  Does not have to take any opioid medication any longer.  Denies any fevers or chills or any drainage from the incisions that she has noticed.  She has been compliant with the nonweightbearing status.  She has been keeping her leg straight.  On exam, patient has 0 degrees extension.  Small effusion present.  Incisions look to be healing well with sutures intact.  The sutures were removed and replaced with Steri-Strips today.  She has no calf tenderness.  Negative Homans' sign.  Palpable DP pulse.  Intact ankle dorsiflexion and plantarflexion.  She is able to perform straight leg raise multiple times without extensor lag and quite easily.  5/5 quad strength.  Plan is to continue with straight leg raises and start range of motion exercises.  She is okay to slowly work on flexion up to 90 degrees.  The goal is at her next visit in 3 weeks she has range of motion from 0 degrees to 90 degrees.  Will set her up for CPM machine.  She will continue with nonweightbearing status.  Start weightbearing at her next visit after seeing Lisa Duran.  Recommended she call or send MyChart message with any concerns.  Overall she seems to be doing very well.  Continue with aspirin for DVT prophylaxis.  Follow-Up Instructions: No follow-ups on file.    Orders:  No orders of the defined types were placed in this encounter.  No orders of the defined types were placed in this encounter.   Imaging: No results found.  PMFS History: Patient Active Problem List   Diagnosis Date Noted   B12 deficiency 05/09/2021   Dyslipidemia 04/14/2019   Enteritis 01/12/2019   Sleep walking disorder 04/06/2017   Iron deficiency anemia 03/27/2017   Fibromyalgia 02/03/2017   GERD (gastroesophageal reflux disease) 06/24/2015   Eczema 06/22/2015   Depression 06/22/2015   Tachycardia 06/16/2014   Food allergy    ADHD (attention deficit hyperactivity disorder) 01/27/2011   Anxiety 02/20/2010   ALLERGIC RHINITIS 11/21/2009   OVARIAN CYST 11/21/2009   Diabetes mellitus type 2 with neurological manifestations (HCC) 11/20/2009   Class 2 obesity in adult 11/20/2009   Migraine without aura 11/20/2009   Past Medical History:  Diagnosis Date   ADHD (attention deficit hyperactivity disorder)    ALLERGIC RHINITIS    Allergy    Anemia    ANXIETY    Depression    DIABETES MELLITUS, TYPE II    Fibromyalgia 2018   GERD (gastroesophageal reflux disease)    MIGRAINE, COMMON    Obesity, unspecified    OVARIAN CYST     Family History  Problem Relation Age of Onset   Diabetes Mother    Hyperlipidemia Mother  CAD Mother    CVA Mother    Diabetes Father    Hyperlipidemia Father    Coronary artery disease Father    Hypertension Father    Cancer Father        esoph   Stroke Father    Cancer Maternal Grandfather 38       Prostate and Lung   Diabetes Sister        Gestational   Graves' disease Maternal Aunt    Cancer Maternal Uncle        Brain   Stroke Paternal Grandmother    Heart disease Paternal Grandmother    Stroke Paternal Grandfather    Heart disease Paternal Grandfather    Diabetes Sister        Gestational and Type II   Hashimoto's thyroiditis Cousin    Cancer Maternal Uncle        Brain    Past Surgical History:  Procedure  Laterality Date   NO PAST SURGERIES     Social History   Occupational History   Not on file  Tobacco Use   Smoking status: Former    Types: Cigarettes    Quit date: 08/20/2002    Years since quitting: 20.0   Smokeless tobacco: Never   Tobacco comments:    single, separated from spouse 02/2010.   Vaping Use   Vaping Use: Never used  Substance and Sexual Activity   Alcohol use: Yes    Comment: couple of drinks a week   Drug use: No   Sexual activity: Not Currently    Birth control/protection: I.U.D.

## 2022-09-12 ENCOUNTER — Other Ambulatory Visit: Payer: Self-pay

## 2022-09-12 ENCOUNTER — Telehealth: Payer: Self-pay | Admitting: Internal Medicine

## 2022-09-12 MED ORDER — DEXMETHYLPHENIDATE HCL 10 MG PO TABS: 20.0000 mg | ORAL_TABLET | Freq: Two times a day (BID) | ORAL | 0 refills | Status: DC

## 2022-09-12 NOTE — Telephone Encounter (Signed)
[facility] called stated the do not dexmethylphenidate (FOCALIN) 10 MG tablet in stock and that goes for all [facility]. Pt needs to chose an alternative pharmacy

## 2022-09-22 ENCOUNTER — Encounter: Payer: Self-pay | Admitting: Internal Medicine

## 2022-09-22 MED ORDER — EPINEPHRINE 0.3 MG/0.3ML IJ SOAJ: INTRAMUSCULAR | 0 refills | Status: AC

## 2022-10-01 ENCOUNTER — Ambulatory Visit (INDEPENDENT_AMBULATORY_CARE_PROVIDER_SITE_OTHER): Payer: 59 | Admitting: Orthopedic Surgery

## 2022-10-01 ENCOUNTER — Encounter: Payer: Self-pay | Admitting: Orthopedic Surgery

## 2022-10-01 DIAGNOSIS — Z9889 Other specified postprocedural states: Secondary | ICD-10-CM

## 2022-10-01 NOTE — Progress Notes (Signed)
Post-Op Visit Note   Patient: Lisa Duran           Date of Birth: 1980-10-30           MRN: [id number] Visit Date: 10/01/2022 PCP: Binnie Rail, MD   Assessment & Plan:  Chief Complaint:  Chief Complaint  Patient presents with   Left Knee - Routine Post Op   Visit Diagnoses:  1. S/P medial meniscal repair     Plan: Is a 42 year old patient who underwent left knee arthroscopy with meniscal root repair medially 08/25/2022.  No pain or swelling.  On exam she bends easily to 90 degrees and there is no effusion or joint line tenderness.  Plan at this time is that it is okay for her to bend past 90 but no loaded flexion past 90 degrees.  [facility] therapy 2-3 times a week plus a home exercise program to progress to weightbearing as tolerated over the next 2 weeks.  Out of work until March 17.  Return to work on 10/27/2022 and return to see [place] 1 day before hand.  No calf tenderness negative Homans today.  Follow-Up Instructions: No follow-ups on file.   Orders:  Orders Placed This Encounter  Procedures   Ambulatory referral to Physical Therapy   No orders of the defined types were placed in this encounter.   Imaging: No results found.  PMFS History: Patient Active Problem List   Diagnosis Date Noted   B12 deficiency 05/09/2021   Dyslipidemia 04/14/2019   Enteritis 01/12/2019   Sleep walking disorder 04/06/2017   Iron deficiency anemia 03/27/2017   Fibromyalgia 02/03/2017   GERD (gastroesophageal reflux disease) 06/24/2015   Eczema 06/22/2015   Depression 06/22/2015   Tachycardia 06/16/2014   Food allergy    ADHD (attention deficit hyperactivity disorder) 01/27/2011   Anxiety 02/20/2010   ALLERGIC RHINITIS 11/21/2009   OVARIAN CYST 11/21/2009   Diabetes mellitus type 2 with neurological manifestations ([place]) 11/20/2009   Class 2 obesity in adult 11/20/2009   Migraine without aura 11/20/2009   Past Medical History:  Diagnosis Date   ADHD (attention  deficit hyperactivity disorder)    ALLERGIC RHINITIS    Allergy    Anemia    ANXIETY    Depression    DIABETES MELLITUS, TYPE II    Fibromyalgia 2018   GERD (gastroesophageal reflux disease)    MIGRAINE, COMMON    Obesity, unspecified    OVARIAN CYST     Family History  Problem Relation Age of Onset   Diabetes Mother    Hyperlipidemia Mother    CAD Mother    CVA Mother    Diabetes Father    Hyperlipidemia Father    Coronary artery disease Father    Hypertension Father    Cancer Father        esoph   Stroke Father    Cancer Maternal Grandfather 59       Prostate and Lung   Diabetes Sister        Gestational   Graves' disease Maternal Aunt    Cancer Maternal Uncle        Brain   Stroke Paternal Grandmother    Heart disease Paternal Grandmother    Stroke Paternal Grandfather    Heart disease Paternal Grandfather    Diabetes Sister        Gestational and Type II   Hashimoto's thyroiditis Cousin    Cancer Maternal Uncle        Brain  Past Surgical History:  Procedure Laterality Date   NO PAST SURGERIES     Social History   Occupational History   Not on file  Tobacco Use   Smoking status: Former    Types: Cigarettes    Quit date: 08/20/2002    Years since quitting: 20.1   Smokeless tobacco: Never   Tobacco comments:    single, separated from spouse 02/2010.   Vaping Use   Vaping Use: Never used  Substance and Sexual Activity   Alcohol use: Yes    Comment: couple of drinks a week   Drug use: No   Sexual activity: Not Currently    Birth control/protection: I.U.D.

## 2022-10-01 NOTE — Progress Notes (Signed)
L knee scope--08/25/22

## 2022-10-05 NOTE — Telephone Encounter (Signed)
Once she getsto 90 on cpm ok to dc thx

## 2022-10-08 ENCOUNTER — Ambulatory Visit: Payer: 59 | Attending: Orthopedic Surgery | Admitting: Physical Therapy

## 2022-10-08 ENCOUNTER — Encounter: Payer: Self-pay | Admitting: Physical Therapy

## 2022-10-08 DIAGNOSIS — M25662 Stiffness of left knee, not elsewhere classified: Secondary | ICD-10-CM | POA: Insufficient documentation

## 2022-10-08 DIAGNOSIS — M25562 Pain in left knee: Secondary | ICD-10-CM | POA: Insufficient documentation

## 2022-10-08 DIAGNOSIS — R262 Difficulty in walking, not elsewhere classified: Secondary | ICD-10-CM | POA: Insufficient documentation

## 2022-10-08 DIAGNOSIS — Z9889 Other specified postprocedural states: Secondary | ICD-10-CM | POA: Insufficient documentation

## 2022-10-08 NOTE — Therapy (Signed)
OUTPATIENT PHYSICAL THERAPY LOWER EXTREMITY EVALUATION   Patient Name: Lisa Duran MRN: [id number] DOB:November 20, 1980, 42 y.o., female Today's Date: 10/08/2022  END OF SESSION:  PT End of Session - 10/08/22 0926     Visit Number 1    Date for PT Re-Evaluation 01/06/23    Authorization Type [employer]    PT Start Time 0925    PT Stop Time 1020    PT Time Calculation (min) 55 min    Activity Tolerance Patient tolerated treatment well;Patient limited by pain    Behavior During Therapy [facility] for tasks assessed/performed             Past Medical History:  Diagnosis Date   ADHD (attention deficit hyperactivity disorder)    ALLERGIC RHINITIS    Allergy    Anemia    ANXIETY    Depression    DIABETES MELLITUS, TYPE II    Fibromyalgia 2018   GERD (gastroesophageal reflux disease)    MIGRAINE, COMMON    Obesity, unspecified    OVARIAN CYST    Past Surgical History:  Procedure Laterality Date   NO PAST SURGERIES     Patient Active Problem List   Diagnosis Date Noted   B12 deficiency 05/09/2021   Dyslipidemia 04/14/2019   Enteritis 01/12/2019   Sleep walking disorder 04/06/2017   Iron deficiency anemia 03/27/2017   Fibromyalgia 02/03/2017   GERD (gastroesophageal reflux disease) 06/24/2015   Eczema 06/22/2015   Depression 06/22/2015   Tachycardia 06/16/2014   Food allergy    ADHD (attention deficit hyperactivity disorder) 01/27/2011   Anxiety 02/20/2010   ALLERGIC RHINITIS 11/21/2009   OVARIAN CYST 11/21/2009   Diabetes mellitus type 2 with neurological manifestations ([place]) 11/20/2009   Class 2 obesity in adult 11/20/2009   Migraine without aura 11/20/2009    PCP: Billey Gosling, MD  REFERRING PROVIDER: Mittie Bodo, MD  REFERRING DIAG: s/p left meniscus repair  THERAPY DIAG:  Acute pain of left knee  Stiffness of left knee, not elsewhere classified  Difficulty in walking, not elsewhere classified  Rationale for Evaluation and Treatment: Rehabilitation  ONSET DATE:  09/01/22  SUBJECTIVE:   SUBJECTIVE STATEMENT: Not sure of specific MOI, but has had pain for about a year, MRI showed medial meniscus tear, she underwent surgery to repair on 09/01/22, She has been NWB for the past 4 weeks and started WBAT on 10/01/22, c/o increased soreness  PERTINENT HISTORY: See abo e PAIN:  Are you having pain? Yes: NPRS scale: 2/10 Pain location: across the knee Pain description: tight ache Aggravating factors: weight bearing , bending pain up to 6/10 Relieving factors: ice, rest, Tylenol, pain can get down to a 1/10  PRECAUTIONS: None  WEIGHT BEARING RESTRICTIONS: No  FALLS:  Has patient fallen in last 6 months? Yes. Number of falls 1  LIVING ENVIRONMENT: Lives with: lives with their family Lives in: House/apartment Stairs: Yes: Internal: 14 steps; can reach both Has following equipment at home: Walker - 2 wheeled  OCCUPATION: sitting mostly, some walking, lifting up to 30#  PLOF: Independent and walking dog 45 minutes a day, some small hikes  PATIENT GOALS: walk dog, hike, have no pain  NEXT MD VISIT: March 15  OBJECTIVE:   DIAGNOSTIC FINDINGS: see above  PATIENT SURVEYS:  FOTO 20.5  COGNITION: Overall cognitive status: Within functional limits for tasks assessed     SENSATION: WFL  EDEMA:  Circumferential: right 40cm mid patella, left 41 cm  PALPATION: Non tender, mild warmth and edema  LOWER EXTREMITY  ROM:  Active ROM Right eval Left eval  Hip flexion    Hip extension    Hip abduction    Hip adduction    Hip internal rotation    Hip external rotation    Knee flexion  110  Knee extension  0  Ankle dorsiflexion    Ankle plantarflexion    Ankle inversion    Ankle eversion     (Blank rows = not tested)  LOWER EXTREMITY MMT:  MMT Right eval Left eval  Hip flexion    Hip extension    Hip abduction    Hip adduction    Hip internal rotation    Hip external rotation    Knee flexion  4-  Knee extension  4-  Ankle  dorsiflexion    Ankle plantarflexion    Ankle inversion    Ankle eversion     (Blank rows = not tested) FUNCTIONAL TESTS:  Timed up and go (TUG): 18 seconds  GAIT: Distance walked: 50 feet Assistive device utilized: Walker - 2 wheeled Level of assistance: CGA Comments: step to, very poor weight bearing, touching the toe down, but not the heel, unsure and scared to walk, unable to do stairs   TODAY'S TREATMENT:                                                                                                                              DATE:     PATIENT EDUCATION:  Education details: Photographer Person educated: Patient Education method: Explanation, Demonstration, Corporate treasurer cues, Verbal cues, and Handouts Education comprehension: verbalized understanding, returned demonstration, verbal cues required, and tactile cues required  HOME EXERCISE PROGRAM: Access Code: [email or web address] URL: [email or web address] Date: 10/08/2022 Prepared by: Lum Babe  Exercises - Supine Active Straight Leg Raise  - 2 x daily - 7 x weekly - 2 sets - 10 reps - 3 hold - Sidelying Hip Abduction  - 2 x daily - 7 x weekly - 2 sets - 10 reps - 3 hold - Sidelying Hip Adduction  - 2 x daily - 7 x weekly - 2 sets - 10 reps - 3 hold - Prone Hip Extension  - 2 x daily - 7 x weekly - 2 sets - 10 reps - 3 hold  ASSESSMENT:  CLINICAL IMPRESSION: Patient is a 42 y.o. female who was seen today for physical therapy evaluation and treatment for left medial meniscus repair.  She is currently using a FWW and is doing a toe touch step to pattern, she is to be WBAT over the next week.  She has a lot of fear with this.  She has good ROM mild edema.  Biggest issue to day is gait and needed education on no twisting while weight bearing and no hyper flexion, no open chain exercises to start   OBJECTIVE IMPAIRMENTS: Abnormal gait, cardiopulmonary status limiting activity, decreased activity  tolerance, decreased balance, decreased endurance, decreased mobility, difficulty walking, decreased  ROM, decreased strength, increased edema, impaired flexibility, and pain.  REHAB POTENTIAL: Good  CLINICAL DECISION MAKING: Stable/uncomplicated  EVALUATION COMPLEXITY: Low   GOALS: Goals reviewed with patient? Yes  SHORT TERM GOALS: Target date: 10/19/22 Independent with initial HEP Goal status: INITIAL LONG TERM GOALS: Target date: 01/06/23  Independent and safe with advanced HEP Goal status: INITIAL  2.  Resume walking 20 minutes a day Goal status: INITIAL  3.  Increase left knee AROM to WNL's Goal status: INITIAL  4.  Decrease TUG time to 10 seconds Goal status: INITIAL  5.  Walk without device and do stairs step over step Goal status: INITIAL  6.  No pain with return to work Goal status: INITIAL   PLAN:  PT FREQUENCY: 1-2x/week  PT DURATION: 12 weeks  PLANNED INTERVENTIONS: Therapeutic exercises, Therapeutic activity, Neuromuscular re-education, Balance training, Gait training, Patient/Family education, Self Care, Joint mobilization, Stair training, Vasopneumatic device, and Manual therapy  PLAN FOR NEXT SESSION: start bike, closed chain activities, gait WBAT and progress to no device   Cyree Chuong W, PT 10/08/2022, 9:28 AM

## 2022-10-13 ENCOUNTER — Ambulatory Visit: Payer: 59

## 2022-10-13 ENCOUNTER — Ambulatory Visit: Payer: 59 | Attending: Orthopedic Surgery | Admitting: Physical Therapy

## 2022-10-13 ENCOUNTER — Encounter: Payer: Self-pay | Admitting: Physical Therapy

## 2022-10-13 DIAGNOSIS — R262 Difficulty in walking, not elsewhere classified: Secondary | ICD-10-CM | POA: Diagnosis present

## 2022-10-13 DIAGNOSIS — R600 Localized edema: Secondary | ICD-10-CM | POA: Diagnosis present

## 2022-10-13 DIAGNOSIS — M25562 Pain in left knee: Secondary | ICD-10-CM | POA: Insufficient documentation

## 2022-10-13 DIAGNOSIS — M25662 Stiffness of left knee, not elsewhere classified: Secondary | ICD-10-CM | POA: Insufficient documentation

## 2022-10-13 DIAGNOSIS — M25671 Stiffness of right ankle, not elsewhere classified: Secondary | ICD-10-CM | POA: Diagnosis present

## 2022-10-13 NOTE — Therapy (Signed)
OUTPATIENT PHYSICAL THERAPY LOWER EXTREMITY TREATMENT   Patient Name: Lisa Duran MRN: [id number] DOB:1980-09-08, 42 y.o., female Today's Date: 10/13/2022  END OF SESSION:  PT End of Session - 10/13/22 1015     Visit Number 2    Date for PT Re-Evaluation 01/06/23    PT Start Time 1015    PT Stop Time 1100    PT Time Calculation (min) 45 min    Activity Tolerance Patient tolerated treatment well;Patient limited by pain    Behavior During Therapy [facility] for tasks assessed/performed             Past Medical History:  Diagnosis Date   ADHD (attention deficit hyperactivity disorder)    ALLERGIC RHINITIS    Allergy    Anemia    ANXIETY    Depression    DIABETES MELLITUS, TYPE II    Fibromyalgia 2018   GERD (gastroesophageal reflux disease)    MIGRAINE, COMMON    Obesity, unspecified    OVARIAN CYST    Past Surgical History:  Procedure Laterality Date   NO PAST SURGERIES     Patient Active Problem List   Diagnosis Date Noted   B12 deficiency 05/09/2021   Dyslipidemia 04/14/2019   Enteritis 01/12/2019   Sleep walking disorder 04/06/2017   Iron deficiency anemia 03/27/2017   Fibromyalgia 02/03/2017   GERD (gastroesophageal reflux disease) 06/24/2015   Eczema 06/22/2015   Depression 06/22/2015   Tachycardia 06/16/2014   Food allergy    ADHD (attention deficit hyperactivity disorder) 01/27/2011   Anxiety 02/20/2010   ALLERGIC RHINITIS 11/21/2009   OVARIAN CYST 11/21/2009   Diabetes mellitus type 2 with neurological manifestations ([place]) 11/20/2009   Class 2 obesity in adult 11/20/2009   Migraine without aura 11/20/2009    PCP: Billey Gosling, MD  REFERRING PROVIDER: Mittie Bodo, MD  REFERRING DIAG: s/p left meniscus repair  THERAPY DIAG:  Acute pain of left knee  Stiffness of left knee, not elsewhere classified  Difficulty in walking, not elsewhere classified  Rationale for Evaluation and Treatment: Rehabilitation  ONSET DATE: 09/01/22  SUBJECTIVE:    SUBJECTIVE STATEMENT: L knee is sore since she has been putting mote weigh on it  PERTINENT HISTORY: See abo e PAIN:  Are you having pain? Yes: NPRS scale: 0/10 Pain location: across the knee Pain description: tight ache Aggravating factors: weight bearing , bending pain up to 6/10 Relieving factors: ice, rest, Tylenol, pain can get down to a 1/10  PRECAUTIONS: None  WEIGHT BEARING RESTRICTIONS: No  FALLS:  Has patient fallen in last 6 months? Yes. Number of falls 1  LIVING ENVIRONMENT: Lives with: lives with their family Lives in: House/apartment Stairs: Yes: Internal: 14 steps; can reach both Has following equipment at home: Walker - 2 wheeled  OCCUPATION: sitting mostly, some walking, lifting up to 30#  PLOF: Independent and walking dog 45 minutes a day, some small hikes  PATIENT GOALS: walk dog, hike, have no pain  NEXT MD VISIT: March 15  OBJECTIVE:   DIAGNOSTIC FINDINGS: see above  PATIENT SURVEYS:  FOTO 20.5  COGNITION: Overall cognitive status: Within functional limits for tasks assessed     SENSATION: WFL  EDEMA:  Circumferential: right 40cm mid patella, left 41 cm  PALPATION: Non tender, mild warmth and edema  LOWER EXTREMITY ROM:  Active ROM Right eval Left eval  Hip flexion    Hip extension    Hip abduction    Hip adduction    Hip internal rotation  Hip external rotation    Knee flexion  110  Knee extension  0  Ankle dorsiflexion    Ankle plantarflexion    Ankle inversion    Ankle eversion     (Blank rows = not tested)  LOWER EXTREMITY MMT:  MMT Right eval Left eval  Hip flexion    Hip extension    Hip abduction    Hip adduction    Hip internal rotation    Hip external rotation    Knee flexion  4-  Knee extension  4-  Ankle dorsiflexion    Ankle plantarflexion    Ankle inversion    Ankle eversion     (Blank rows = not tested) FUNCTIONAL TESTS:  Timed up and go (TUG): 18 seconds  GAIT: Distance walked: 50  feet Assistive device utilized: Environmental consultant - 2 wheeled Level of assistance: CGA Comments: step to, very poor weight bearing, touching the toe down, but not the heel, unsure and scared to walk, unable to do stairs   TODAY'S TREATMENT:                                                                                                                              DATE:  10/13/22 Bike L 2 x 6 min  Lle PROM flex and Ext Supine LLE SLR x10, 2lb x10 Supine LLE SAQ x10, 2lb 2x10  Bridges 2x10 S2S 2x5 elevated mat, some anterior L knee pain  Standing march w/ and w/o RX x 10 each  Gait SPC 27f x2  Gait 75lb  then 150 ft being cautious not pivot on LLE 4 in step ups with rail x5 then x10  Heel raises 2x10  PATIENT EDUCATION:  Education details: PPhotographerPerson educated: Patient Education method: Explanation, Demonstration, TCorporate treasurercues, Verbal cues, and Handouts Education comprehension: verbalized understanding, returned demonstration, verbal cues required, and tactile cues required  HOME EXERCISE PROGRAM: Access Code: [email or web address] URL: [email or web address] Date: 10/08/2022 Prepared by: MLum Babe Exercises - Supine Active Straight Leg Raise  - 2 x daily - 7 x weekly - 2 sets - 10 reps - 3 hold - Sidelying Hip Abduction  - 2 x daily - 7 x weekly - 2 sets - 10 reps - 3 hold - Sidelying Hip Adduction  - 2 x daily - 7 x weekly - 2 sets - 10 reps - 3 hold - Prone Hip Extension  - 2 x daily - 7 x weekly - 2 sets - 10 reps - 3 hold  ASSESSMENT:  CLINICAL IMPRESSION: Patient is a 42y.o. female who was seen today for physical therapy treatment for left medial meniscus repair.  She has good ROM mild edema. Session progressed with moe functional interventions such as gait without AD and step ups. Some initial L knee pain reported with sit to stands and when controlling the eccentric phase of step ups. Updated HEP   OBJECTIVE IMPAIRMENTS: Abnormal gait,  cardiopulmonary status  limiting activity, decreased activity tolerance, decreased balance, decreased endurance, decreased mobility, difficulty walking, decreased ROM, decreased strength, increased edema, impaired flexibility, and pain.  REHAB POTENTIAL: Good  CLINICAL DECISION MAKING: Stable/uncomplicated  EVALUATION COMPLEXITY: Low   GOALS: Goals reviewed with patient? Yes  SHORT TERM GOALS: Target date: 10/19/22 Independent with initial HEP Goal status: INITIAL LONG TERM GOALS: Target date: 01/06/23  Independent and safe with advanced HEP Goal status: INITIAL  2.  Resume walking 20 minutes a day Goal status: INITIAL  3.  Increase left knee AROM to WNL's Goal status: INITIAL  4.  Decrease TUG time to 10 seconds Goal status: INITIAL  5.  Walk without device and do stairs step over step Goal status: INITIAL  6.  No pain with return to work Goal status: INITIAL   PLAN:  PT FREQUENCY: 1-2x/week  PT DURATION: 12 weeks  PLANNED INTERVENTIONS: Therapeutic exercises, Therapeutic activity, Neuromuscular re-education, Balance training, Gait training, Patient/Family education, Self Care, Joint mobilization, Stair training, Vasopneumatic device, and Manual therapy  PLAN FOR NEXT SESSION: start bike, closed chain activities, gait WBAT and progress to no device   Scot Jun, PTA 10/13/2022, 10:16 AM

## 2022-10-18 ENCOUNTER — Other Ambulatory Visit: Payer: Self-pay | Admitting: Internal Medicine

## 2022-10-20 ENCOUNTER — Encounter: Payer: Self-pay | Admitting: Physical Therapy

## 2022-10-20 ENCOUNTER — Ambulatory Visit: Payer: 59 | Admitting: Physical Therapy

## 2022-10-20 DIAGNOSIS — R262 Difficulty in walking, not elsewhere classified: Secondary | ICD-10-CM

## 2022-10-20 DIAGNOSIS — M25562 Pain in left knee: Secondary | ICD-10-CM

## 2022-10-20 DIAGNOSIS — M25662 Stiffness of left knee, not elsewhere classified: Secondary | ICD-10-CM

## 2022-10-20 DIAGNOSIS — R600 Localized edema: Secondary | ICD-10-CM

## 2022-10-20 DIAGNOSIS — M25671 Stiffness of right ankle, not elsewhere classified: Secondary | ICD-10-CM

## 2022-10-20 NOTE — Therapy (Signed)
OUTPATIENT PHYSICAL THERAPY LOWER EXTREMITY TREATMENT   Patient Name: Lisa Duran MRN: [id number] DOB:1981/03/10, 42 y.o., female Today's Date: 10/20/2022  END OF SESSION:  PT End of Session - 10/20/22 1017     Visit Number 3    Date for PT Re-Evaluation 01/06/23    PT Start Time 1017    PT Stop Time 1100    PT Time Calculation (min) 43 min    Activity Tolerance Patient tolerated treatment well    Behavior During Therapy WFL for tasks assessed/performed             Past Medical History:  Diagnosis Date   ADHD (attention deficit hyperactivity disorder)    ALLERGIC RHINITIS    Allergy    Anemia    ANXIETY    Depression    DIABETES MELLITUS, TYPE II    Fibromyalgia 2018   GERD (gastroesophageal reflux disease)    MIGRAINE, COMMON    Obesity, unspecified    OVARIAN CYST    Past Surgical History:  Procedure Laterality Date   NO PAST SURGERIES     Patient Active Problem List   Diagnosis Date Noted   B12 deficiency 05/09/2021   Dyslipidemia 04/14/2019   Enteritis 01/12/2019   Sleep walking disorder 04/06/2017   Iron deficiency anemia 03/27/2017   Fibromyalgia 02/03/2017   GERD (gastroesophageal reflux disease) 06/24/2015   Eczema 06/22/2015   Depression 06/22/2015   Tachycardia 06/16/2014   Food allergy    ADHD (attention deficit hyperactivity disorder) 01/27/2011   Anxiety 02/20/2010   ALLERGIC RHINITIS 11/21/2009   OVARIAN CYST 11/21/2009   Diabetes mellitus type 2 with neurological manifestations ([place]) 11/20/2009   Class 2 obesity in adult 11/20/2009   Migraine without aura 11/20/2009    PCP: Billey Gosling, MD  REFERRING PROVIDER: Mittie Bodo, MD  REFERRING DIAG: s/p left meniscus repair  THERAPY DIAG:  Acute pain of left knee  Stiffness of left knee, not elsewhere classified  Difficulty in walking, not elsewhere classified  Stiffness of right ankle, not elsewhere classified  Localized edema  Rationale for Evaluation and Treatment:  Rehabilitation  ONSET DATE: 09/01/22  SUBJECTIVE:   SUBJECTIVE STATEMENT: Doing ok, L knee bulked twice on Saturday and once yesterday, very sharp pain. R knee soreness today  PERTINENT HISTORY: See abo e PAIN:  Are you having pain? Yes: NPRS scale: 2/10 Pain location: across the knee Pain description: sore Aggravating factors: weight bearing , bending pain up to 6/10 Relieving factors: ice, rest, Tylenol, pain can get down to a 1/10  PRECAUTIONS: None  WEIGHT BEARING RESTRICTIONS: No  FALLS:  Has patient fallen in last 6 months? Yes. Number of falls 1  LIVING ENVIRONMENT: Lives with: lives with their family Lives in: House/apartment Stairs: Yes: Internal: 14 steps; can reach both Has following equipment at home: Walker - 2 wheeled  OCCUPATION: sitting mostly, some walking, lifting up to 30#  PLOF: Independent and walking dog 45 minutes a day, some small hikes  PATIENT GOALS: walk dog, hike, have no pain  NEXT MD VISIT: March 15  OBJECTIVE:   DIAGNOSTIC FINDINGS: see above  PATIENT SURVEYS:  FOTO 20.5  COGNITION: Overall cognitive status: Within functional limits for tasks assessed     SENSATION: WFL  EDEMA:  Circumferential: right 40cm mid patella, left 41 cm  PALPATION: Non tender, mild warmth and edema  LOWER EXTREMITY ROM:  Active ROM Right eval Left eval  Hip flexion    Hip extension    Hip abduction  Hip adduction    Hip internal rotation    Hip external rotation    Knee flexion  110  Knee extension  0  Ankle dorsiflexion    Ankle plantarflexion    Ankle inversion    Ankle eversion     (Blank rows = not tested)  LOWER EXTREMITY MMT:  MMT Right eval Left eval  Hip flexion    Hip extension    Hip abduction    Hip adduction    Hip internal rotation    Hip external rotation    Knee flexion  4-  Knee extension  4-  Ankle dorsiflexion    Ankle plantarflexion    Ankle inversion    Ankle eversion     (Blank rows = not  tested) FUNCTIONAL TESTS:  Timed up and go (TUG): 18 seconds  GAIT: Distance walked: 50 feet Assistive device utilized: Environmental consultant - 2 wheeled Level of assistance: CGA Comments: step to, very poor weight bearing, touching the toe down, but not the heel, unsure and scared to walk, unable to do stairs   TODAY'S TREATMENT:                                                                                                                              DATE:  10/20/22 Bike L2 x 6 min  LLE PROM flex and Ext LLE LAQ 2lb 2x10 LLE hamstring curls red 2x10 20lb resisted gait 4 way x3  4 inch in step ups x10 each 4 in lateral step ups x10 each Heel raises black bar 2x10 Calf stretch S2S 2x10    10/13/22 Bike L 2 x 6 min  Lle PROM flex and Ext Supine LLE SLR x10, 2lb x10 Supine LLE SAQ x10, 2lb 2x10  Bridges 2x10 S2S 2x5 elevated mat, some anterior L knee pain  Standing march w/ and w/o RX x 10 each  Gait SPC 50f x2  Gait 75lb  then 150 ft being cautious not pivot on LLE 4 in step ups with rail x5 then x10  Heel raises 2x10  PATIENT EDUCATION:  Education details: PPhotographerPerson educated: Patient Education method: Explanation, Demonstration, Tactile cues, Verbal cues, and Handouts Education comprehension: verbalized understanding, returned demonstration, verbal cues required, and tactile cues required  HOME EXERCISE PROGRAM: Access Code: [email or web address] URL: [email or web address] Date: 10/08/2022 Prepared by: MLum Babe Exercises - Supine Active Straight Leg Raise  - 2 x daily - 7 x weekly - 2 sets - 10 reps - 3 hold - Sidelying Hip Abduction  - 2 x daily - 7 x weekly - 2 sets - 10 reps - 3 hold - Sidelying Hip Adduction  - 2 x daily - 7 x weekly - 2 sets - 10 reps - 3 hold - Prone Hip Extension  - 2 x daily - 7 x weekly - 2 sets - 10 reps - 3 hold  ASSESSMENT:  CLINICAL IMPRESSION: Patient is a 42y.o.  female who was seen today for  physical therapy treatment for left medial meniscus repair.  ROM remains well. Progressed session with functional and LLE strengthening interventions. Pt reports he could feel it with step up interventions. LOB x1 with resisted side steps but able to regain balance.   OBJECTIVE IMPAIRMENTS: Abnormal gait, cardiopulmonary status limiting activity, decreased activity tolerance, decreased balance, decreased endurance, decreased mobility, difficulty walking, decreased ROM, decreased strength, increased edema, impaired flexibility, and pain.  REHAB POTENTIAL: Good  CLINICAL DECISION MAKING: Stable/uncomplicated  EVALUATION COMPLEXITY: Low   GOALS: Goals reviewed with patient? Yes  SHORT TERM GOALS: Target date: 10/19/22 Independent with initial HEP Goal status: INITIAL LONG TERM GOALS: Target date: 01/06/23  Independent and safe with advanced HEP Goal status: INITIAL  2.  Resume walking 20 minutes a day Goal status: INITIAL  3.  Increase left knee AROM to WNL's Goal status: INITIAL  4.  Decrease TUG time to 10 seconds Goal status: INITIAL  5.  Walk without device and do stairs step over step Goal status: INITIAL  6.  No pain with return to work Goal status: INITIAL   PLAN:  PT FREQUENCY: 1-2x/week  PT DURATION: 12 weeks  PLANNED INTERVENTIONS: Therapeutic exercises, Therapeutic activity, Neuromuscular re-education, Balance training, Gait training, Patient/Family education, Self Care, Joint mobilization, Stair training, Vasopneumatic device, and Manual therapy  PLAN FOR NEXT SESSION: start bike, closed chain activities, gait WBAT and progress to no device   Scot Jun, PTA 10/20/2022, 10:17 AM

## 2022-10-24 ENCOUNTER — Ambulatory Visit (INDEPENDENT_AMBULATORY_CARE_PROVIDER_SITE_OTHER): Payer: 59 | Admitting: Surgical

## 2022-10-24 ENCOUNTER — Encounter: Payer: Self-pay | Admitting: Surgical

## 2022-10-24 DIAGNOSIS — Z9889 Other specified postprocedural states: Secondary | ICD-10-CM

## 2022-10-24 NOTE — Progress Notes (Signed)
Post-Op Visit Note   Patient: Lisa Duran           Date of Birth: [birth year]           MRN: [id number] Visit Date: 10/24/2022 PCP: Binnie Rail, MD   Assessment & Plan:  Chief Complaint:  Chief Complaint  Patient presents with   Left Knee - Follow-up   Visit Diagnoses:  1. S/P medial meniscal repair     Plan: Patient is a 42 year old female who presents s/p left knee arthroscopy with meniscal root repair on 08/25/2022.  She states that she is doing very well.  She has consistent improvement of pain since seeing Dr. Marlou Sa.  She states that stairs are less and less painful with every day.  She does note that she had an incident where she shuffle to the side in the bathroom and had severe sharp pain in the anterior medial aspect of the left knee.  She states that this pain persisted for the next day or 2 before it has resolved completely.  This was about a week ago.  Denies any significant posterior pain that is new for her.  Just taking Tylenol for pain control.  Go to physical therapy 1 time a week.  She is avoiding loaded knee flexion.  On exam, patient has small effusion.  Incisions look to be healed without evidence of infection or dehiscence.  No calf tenderness.  Negative Homans' sign.  2+ DP pulse of the operative extremity.  Intact ankle dorsiflexion and plantarflexion.  Excellent quad strength rated 5/5.  She has range of motion from 0 degrees to 120 degrees with no significant increase in pain with terminal knee flexion.  Plan is to continue with physical therapy.  Still avoid loaded knee flexion.  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  Will see her back in 6 weeks for clinical recheck with Dr. Marlou Sa.  Really want to pay attention to if her small effusion is still present at that point  Follow-Up Instructions: No follow-ups on file.   Orders:  No orders of the defined types were placed in this  encounter.  No orders of the defined types were placed in this encounter.   Imaging: No results found.  PMFS History: Patient Active Problem List   Diagnosis Date Noted   B12 deficiency 05/09/2021   Dyslipidemia 04/14/2019   Enteritis 01/12/2019   Sleep walking disorder 04/06/2017   Iron deficiency anemia 03/27/2017   Fibromyalgia 02/03/2017   GERD (gastroesophageal reflux disease) 06/24/2015   Eczema 06/22/2015   Depression 06/22/2015   Tachycardia 06/16/2014   Food allergy    ADHD (attention deficit hyperactivity disorder) 01/27/2011   Anxiety 02/20/2010   ALLERGIC RHINITIS 11/21/2009   OVARIAN CYST 11/21/2009   Diabetes mellitus type 2 with neurological manifestations ([place]) 11/20/2009   Class 2 obesity in adult 11/20/2009   Migraine without aura 11/20/2009   Past Medical History:  Diagnosis Date   ADHD (attention deficit hyperactivity disorder)    ALLERGIC RHINITIS    Allergy    Anemia    ANXIETY    Depression    DIABETES MELLITUS, TYPE II    Fibromyalgia 2018   GERD (gastroesophageal reflux disease)    MIGRAINE, COMMON    Obesity, unspecified    OVARIAN CYST     Family History  Problem Relation Age of Onset   Diabetes Mother    Hyperlipidemia Mother  CAD Mother    CVA Mother    Diabetes Father    Hyperlipidemia Father    Coronary artery disease Father    Hypertension Father    Cancer Father        esoph   Stroke Father    Cancer Maternal Grandfather 27       Prostate and Lung   Diabetes Sister        Gestational   Graves' disease Maternal Aunt    Cancer Maternal Uncle        Brain   Stroke Paternal Grandmother    Heart disease Paternal Grandmother    Stroke Paternal Grandfather    Heart disease Paternal Grandfather    Diabetes Sister        Gestational and Type II   Hashimoto's thyroiditis Cousin    Cancer Maternal Uncle        Brain    Past Surgical History:  Procedure Laterality Date   NO PAST SURGERIES     Social History    Occupational History   Not on file  Tobacco Use   Smoking status: Former    Types: Cigarettes    Quit date: 08/20/2002    Years since quitting: 20.1   Smokeless tobacco: Never   Tobacco comments:    single, separated from spouse 02/2010.   Vaping Use   Vaping Use: Never used  Substance and Sexual Activity   Alcohol use: Yes    Comment: couple of drinks a week   Drug use: No   Sexual activity: Not Currently    Birth control/protection: I.U.D.

## 2022-10-27 ENCOUNTER — Ambulatory Visit: Payer: 59 | Admitting: Physical Therapy

## 2022-10-27 ENCOUNTER — Encounter: Payer: Self-pay | Admitting: Physical Therapy

## 2022-10-27 DIAGNOSIS — M25662 Stiffness of left knee, not elsewhere classified: Secondary | ICD-10-CM

## 2022-10-27 DIAGNOSIS — M25562 Pain in left knee: Secondary | ICD-10-CM

## 2022-10-27 DIAGNOSIS — R262 Difficulty in walking, not elsewhere classified: Secondary | ICD-10-CM

## 2022-10-27 DIAGNOSIS — M25671 Stiffness of right ankle, not elsewhere classified: Secondary | ICD-10-CM

## 2022-10-27 NOTE — Therapy (Signed)
OUTPATIENT PHYSICAL THERAPY LOWER EXTREMITY TREATMENT   Patient Name: Lisa Duran MRN: [id number] DOB:07/15/1981, 42 y.o., female Today's Date: 10/27/2022  END OF SESSION:  PT End of Session - 10/27/22 1017     Visit Number 4    Date for PT Re-Evaluation 01/06/23    PT Start Time 1015    PT Stop Time 1100    PT Time Calculation (min) 45 min    Activity Tolerance Patient tolerated treatment well    Behavior During Therapy WFL for tasks assessed/performed             Past Medical History:  Diagnosis Date   ADHD (attention deficit hyperactivity disorder)    ALLERGIC RHINITIS    Allergy    Anemia    ANXIETY    Depression    DIABETES MELLITUS, TYPE II    Fibromyalgia 2018   GERD (gastroesophageal reflux disease)    MIGRAINE, COMMON    Obesity, unspecified    OVARIAN CYST    Past Surgical History:  Procedure Laterality Date   NO PAST SURGERIES     Patient Active Problem List   Diagnosis Date Noted   B12 deficiency 05/09/2021   Dyslipidemia 04/14/2019   Enteritis 01/12/2019   Sleep walking disorder 04/06/2017   Iron deficiency anemia 03/27/2017   Fibromyalgia 02/03/2017   GERD (gastroesophageal reflux disease) 06/24/2015   Eczema 06/22/2015   Depression 06/22/2015   Tachycardia 06/16/2014   Food allergy    ADHD (attention deficit hyperactivity disorder) 01/27/2011   Anxiety 02/20/2010   ALLERGIC RHINITIS 11/21/2009   OVARIAN CYST 11/21/2009   Diabetes mellitus type 2 with neurological manifestations ([place]) 11/20/2009   Class 2 obesity in adult 11/20/2009   Migraine without aura 11/20/2009    PCP: Billey Gosling, MD  REFERRING PROVIDER: Mittie Bodo, MD  REFERRING DIAG: s/p left meniscus repair  THERAPY DIAG:  Acute pain of left knee  Stiffness of left knee, not elsewhere classified  Difficulty in walking, not elsewhere classified  Stiffness of right ankle, not elsewhere classified  Rationale for Evaluation and Treatment: Rehabilitation  ONSET DATE:  09/01/22  SUBJECTIVE:   SUBJECTIVE STATEMENT: Sore today, was on her feet more this weekend. L knee gave out once yesterday taking a side step  PERTINENT HISTORY: See abo e PAIN:  Are you having pain? Yes: NPRS scale: 2/10 Pain location: across the L knee Pain description: sore Aggravating factors: weight bearing , bending pain up to 6/10 Relieving factors: ice, rest, Tylenol, pain can get down to a 1/10  PRECAUTIONS: None  WEIGHT BEARING RESTRICTIONS: No  FALLS:  Has patient fallen in last 6 months? Yes. Number of falls 1  LIVING ENVIRONMENT: Lives with: lives with their family Lives in: House/apartment Stairs: Yes: Internal: 14 steps; can reach both Has following equipment at home: Walker - 2 wheeled  OCCUPATION: sitting mostly, some walking, lifting up to 30#  PLOF: Independent and walking dog 45 minutes a day, some small hikes  PATIENT GOALS: walk dog, hike, have no pain  NEXT MD VISIT: March 15  OBJECTIVE:   DIAGNOSTIC FINDINGS: see above  PATIENT SURVEYS:  FOTO 20.5  COGNITION: Overall cognitive status: Within functional limits for tasks assessed     SENSATION: WFL  EDEMA:  Circumferential: right 40cm mid patella, left 41 cm  PALPATION: Non tender, mild warmth and edema  LOWER EXTREMITY ROM:  Active ROM Right eval Left eval  Hip flexion    Hip extension    Hip abduction  Hip adduction    Hip internal rotation    Hip external rotation    Knee flexion  110  Knee extension  0  Ankle dorsiflexion    Ankle plantarflexion    Ankle inversion    Ankle eversion     (Blank rows = not tested)  LOWER EXTREMITY MMT:  MMT Right eval Left eval  Hip flexion    Hip extension    Hip abduction    Hip adduction    Hip internal rotation    Hip external rotation    Knee flexion  4-  Knee extension  4-  Ankle dorsiflexion    Ankle plantarflexion    Ankle inversion    Ankle eversion     (Blank rows = not tested) FUNCTIONAL TESTS:  Timed up  and go (TUG): 18 seconds  GAIT: Distance walked: 50 feet Assistive device utilized: Environmental consultant - 2 wheeled Level of assistance: CGA Comments: step to, very poor weight bearing, touching the toe down, but not the heel, unsure and scared to walk, unable to do stairs   TODAY'S TREATMENT:                                                                                                                              DATE:  10/27/22 NuStep L 4 x 5 min  Gait outside back building, feels a tight band across the front of the knee, worst during down hill 4in lateral step ups [id number] 6in step ups LLE 2x10 30lb resisted gait 4 way x3  Heel raises 2x10 LLE LAQ 2lb 3x10 LLE HS curls green 2x12  10/20/22 Bike L2 x 6 min  LLE PROM flex and Ext LLE LAQ 2lb 2x10 LLE hamstring curls red 2x10 20lb resisted gait 4 way x3  4 inch in step ups x10 each 4 in lateral step ups x10 each Heel raises black bar 2x10 Calf stretch S2S 2x10    10/13/22 Bike L 2 x 6 min  Lle PROM flex and Ext Supine LLE SLR x10, 2lb x10 Supine LLE SAQ x10, 2lb 2x10  Bridges 2x10 S2S 2x5 elevated mat, some anterior L knee pain  Standing march w/ and w/o RX x 10 each  Gait SPC 4ft x2  Gait 75lb  then 150 ft being cautious not pivot on LLE 4 in step ups with rail x5 then x10  Heel raises 2x10  PATIENT EDUCATION:  Education details: Photographer Person educated: Patient Education method: Explanation, Demonstration, Tactile cues, Verbal cues, and Handouts Education comprehension: verbalized understanding, returned demonstration, verbal cues required, and tactile cues required  HOME EXERCISE PROGRAM: Access Code: [email or web address] URL: [email or web address] Date: 10/08/2022 Prepared by: Lum Babe  Exercises - Supine Active Straight Leg Raise  - 2 x daily - 7 x weekly - 2 sets - 10 reps - 3 hold - Sidelying Hip Abduction  - 2 x daily - 7 x weekly - 2 sets - 10 reps -  3 hold - Sidelying  Hip Adduction  - 2 x daily - 7 x weekly - 2 sets - 10 reps - 3 hold - Prone Hip Extension  - 2 x daily - 7 x weekly - 2 sets - 10 reps - 3 hold  ASSESSMENT:  CLINICAL IMPRESSION: Patient is a 42 y.o. female who was seen today for physical therapy treatment for left medial meniscus repair.  ROM remains well. Progressed session with outdoor ambulation up and down slope. Pt reports she could feel a tight band across the front of her knee with down hill ambulation. No issue wth step ups. No reports fo increase pain  OBJECTIVE IMPAIRMENTS: Abnormal gait, cardiopulmonary status limiting activity, decreased activity tolerance, decreased balance, decreased endurance, decreased mobility, difficulty walking, decreased ROM, decreased strength, increased edema, impaired flexibility, and pain.  REHAB POTENTIAL: Good  CLINICAL DECISION MAKING: Stable/uncomplicated  EVALUATION COMPLEXITY: Low   GOALS: Goals reviewed with patient? Yes  SHORT TERM GOALS: Target date: 10/19/22 Independent with initial HEP Goal status: Met 10/27/22 LONG TERM GOALS: Target date: 01/06/23  Independent and safe with advanced HEP Goal status: INITIAL  2.  Resume walking 20 minutes a day Goal status: Progressing 10/23/22  3.  Increase left knee AROM to WNL's Goal status: INITIAL  4.  Decrease TUG time to 10 seconds Goal status: INITIAL  5.  Walk without device and do stairs step over step Goal status: Partly met 10/27/22  6.  No pain with return to work Goal status: Partly Met 10/27/22   PLAN:  PT FREQUENCY: 1-2x/week  PT DURATION: 12 weeks  PLANNED INTERVENTIONS: Therapeutic exercises, Therapeutic activity, Neuromuscular re-education, Balance training, Gait training, Patient/Family education, Self Care, Joint mobilization, Stair training, Vasopneumatic device, and Manual therapy  PLAN FOR NEXT SESSION: start bike, closed chain activities, gait WBAT and progress to no device   Scot Jun,  PTA 10/27/2022, 10:17 AM

## 2022-10-30 ENCOUNTER — Ambulatory Visit: Payer: No Typology Code available for payment source | Admitting: Internal Medicine

## 2022-10-30 NOTE — Progress Notes (Deleted)
=ubjective:     Patient ID: Lisa Duran, female   DOB: [birth year], 42 y.o.   MRN: [id number]  HPI Lisa Duran is a pleasant 42 y.o.  woman returning for f/u for DM2, insulin dependent, uncontrolled, dx [id number], w/o complications. Last visit 5 months ago.  Interim history: No increased urination, blurry vision, nausea, chest pain.  She has a torn meniscus and had surgery at last visit. After starting Ozempic, she had constipation, gas, nausea, but these improved.  She continues on Pepcid and Mylanta prn. After starting Ozempic she felt she lost almost 70 pounds and feeling much better.  Before last visit, she 17 pounds.  Reviewed HbA1c levels: Lab Results  Component Value Date   HGBA1C 6.9 (A) 06/27/2022   HGBA1C 7.0 (A) 02/20/2022   HGBA1C 6.9 (A) 10/08/2021   HGBA1C 7.6 (A) 07/01/2021   HGBA1C 7.5 (A) 02/21/2021   HGBA1C 7.6 (A) 10/16/2020   HGBA1C 8.6 (A) 06/12/2020   HGBA1C 8.5 (A) 11/07/2019   HGBA1C 8.4 (A) 08/09/2019   HGBA1C 9.1 (A) 04/14/2019   She is on:   - Metformin 1000 mg 2x a day  - NPH 26-28 units and 22-24 units at bedtime >> 0-12 units 2x a day >> 10 >> 12 >> 6-8 units 2x a day - Regular  4-6 units before large meals only - rarely - Ozempic 0.5 mg weekly due to muscle and joint aches We stopped glipizide ER and also Januvia. We tried Cycloset 0.8 mg daily >> stopped end of 11/2016 b/c GERD/AP/C We tried Trulicity >> could not tolerate it: N/V/AP. She restarted Januvia 09/2016. She was on Invokana 100 mg in am (started 12/2014) >> 2 yeast inf >> tx with Diflucan; she was exhausted and had nocturia >> stopped 03/12/2015 Tried Victoza 2 mo ago >> AP, severe GERD, inj site rxn Tried Januvia >> tolerated it well, but stopped when Invokana was suggested.  She was also on Actos, but taken off b/c good control. She had problems tolerating insulins in the past.  She was previously on Lantus and NovoLog.  Basaglar and [place] caused eczema and also GI symptoms.  She has a  [employer], structural.  She checks her sugars more than 4 times a day with her CGM:  Previously:  Previously:   She has hypoglycemia awareness in the 80s.  No CKD: Lab Results  Component Value Date   BUN 6 05/20/2022   Lab Results  Component Value Date   CREATININE 0.69 05/20/2022   No MAU: Lab Results  Component Value Date   MICRALBCREAT 0.6 05/20/2022   MICRALBCREAT 0.5 05/10/2021   MICRALBCREAT 28 05/09/2020   MICRALBCREAT 0.8 10/20/2018   MICRALBCREAT 0.5 02/03/2017   + HL: Lab Results  Component Value Date   CHOL 141 05/20/2022   HDL 64.20 05/20/2022   LDLCALC 62 05/20/2022   TRIG 73.0 05/20/2022   CHOLHDL 2 05/20/2022  On Crestor 5 mg 2x a week.  - Last eye exam fall 2022: reportedly No DR, IOP high - changed opthalmologists, - prev.[place] [facility] in [facility].  - Resolved numbness and tingling in hand and feet.  She felt much better on Cymbalta and Neurontin, but she is now off both -no return of symptoms.  Last foot exam 10/08/2021.  She had CP >> had a stress test 07/04/2015 >>  Normal.  She is on metoprolol for tachycardia. She has fibromyalgia.   She also has a history of iron deficiency anemia and had iron infusions.  In 2019, she just got out of a 5-year relationship.  She was under a lot of stress.   Review of Systems + see HPI  Past Medical History:  Diagnosis Date   ADHD (attention deficit hyperactivity disorder)    ALLERGIC RHINITIS    Allergy    Anemia    ANXIETY    Depression    DIABETES MELLITUS, TYPE II    Fibromyalgia 2018   GERD (gastroesophageal reflux disease)    MIGRAINE, COMMON    Obesity, unspecified    OVARIAN CYST    Past Surgical History:  Procedure Laterality Date   NO PAST SURGERIES     Social History   Socioeconomic History   Marital status: Single    Spouse name: Not on file   Number of children: Not on file   Years of education: Not on file   Highest education level: Not on file  Occupational  History   Not on file  Tobacco Use   Smoking status: Former    Types: Cigarettes    Quit date: 08/20/2002    Years since quitting: 20.2   Smokeless tobacco: Never   Tobacco comments:    single, separated from spouse 02/2010.   Vaping Use   Vaping Use: Never used  Substance and Sexual Activity   Alcohol use: Yes    Comment: couple of drinks a week   Drug use: No   Sexual activity: Not Currently    Birth control/protection: I.U.D.  Other Topics Concern   Not on file  Social History Narrative   Single, separated from spouse 02/2010   Social Determinants of Health   Financial Resource Strain: Not on file  Food Insecurity: Not on file  Transportation Needs: Not on file  Physical Activity: Not on file  Stress: Not on file  Social Connections: Not on file  Intimate Partner Violence: Not on file   Current Outpatient Medications on File Prior to Visit  Medication Sig Dispense Refill   ALPRAZolam (XANAX) 0.5 MG tablet TAKE 1 TABLET(0.5 MG) BY MOUTH THREE TIMES DAILY AS NEEDED FOR ANXIETY 60 tablet 5   aspirin 81 MG chewable tablet CHEW AND SWALLOW 1 TABLET(81 MG) BY MOUTH TWICE DAILY FOR BLOOD CLOT PREVENTION 180 tablet 0   celecoxib (CELEBREX) 100 MG capsule Take 1 capsule (100 mg total) by mouth 2 (two) times daily. 60 capsule 0   Continuous Blood Gluc Receiver ([place]) DEVI by Does not apply route.     Continuous Blood Gluc Sensor (FREESTYLE LIBRE 2 SENSOR) MISC APPLY EVERY 14 DAYS 2 each 2   Continuous Blood Gluc Transmit (DEXCOM G6 TRANSMITTER) MISC by Does not apply route.     cyanocobalamin (VITAMIN B12) 1000 MCG/ML injection Inject 1 mL (1,000 mcg total) into the muscle every 30 (thirty) days. 10 mL 1   dexmethylphenidate (FOCALIN) 10 MG tablet Take 2 tablets (20 mg total) by mouth 2 (two) times daily. 120 tablet 0   DULoxetine (CYMBALTA) 60 MG capsule Take 1 capsule (60 mg total) by mouth daily. 90 capsule 2   EPINEPHrine 0.3 mg/0.3 mL IJ SOAJ injection Use as  needed for hypersensitivity reaction 1 each 0   Fexofenadine HCl (ALLEGRA ALLERGY PO) Take by mouth.     glucose blood (ONETOUCH VERIO) test strip Use 2x a day 200 each 3   Insulin NPH, Human,, Isophane, (HUMULIN N KWIKPEN) 100 UNIT/ML Kiwkpen Inject 10-12 Units into the skin 2 (two) times daily. 30 mL 3   Insulin Pen  Needle 32G X 4 MM MISC Use as instructed to administer injections 3X daily 200 each 1   insulin regular (HUMULIN R) 100 units/mL injection INJECT8-10 UNITS INTO THE SKIN THREE TIMES DAILY BEFORE MEALS. DISCARD OPEN VIAL AFTER 28 DAYS.     Insulin Syringe-Needle U-100 (B-D INS SYR ULTRAFINE .5CC/30G) 30G X 1/2" 0.5 ML MISC Use to inject insulin once daily. 100 each 11   levonorgestrel (MIRENA) 20 MCG/24HR IUD 1 Intra Uterine Device (1 each total) by Intrauterine route once. 1 each 0   metFORMIN (GLUCOPHAGE) 1000 MG tablet TAKE 2 TABLETS BY MOUTH DAILY WITH SUPPER. TAKE 1 TABLET BY MOUTH TWICE A DAY WITH A MEAL 180 tablet 3   methocarbamol (ROBAXIN) 500 MG tablet Take 1 tablet (500 mg total) by mouth every 8 (eight) hours as needed for muscle spasms. 30 tablet 1   metoprolol succinate (TOPROL-XL) 25 MG 24 hr tablet TAKE 1 TABLET(25 MG) BY MOUTH DAILY 90 tablet 1   nitrofurantoin, macrocrystal-monohydrate, (MACROBID) 100 MG capsule Take 1 capsule (100 mg total) by mouth 2 (two) times daily. 14 capsule 0   omeprazole (PRILOSEC) 40 MG capsule TAKE 1 CAPULE BY MOUTH BEFORE SUPPER DAILY 90 capsule 2   ondansetron (ZOFRAN ODT) 4 MG disintegrating tablet Take 1 tablet (4 mg total) by mouth every 8 (eight) hours as needed for nausea or vomiting. 20 tablet 0   oxyCODONE (ROXICODONE) 5 MG immediate release tablet Take 1 tablet (5 mg total) by mouth every 4 (four) hours as needed for severe pain. 30 tablet 0   promethazine (PHENERGAN) 25 MG tablet Take 1 tablet (25 mg total) by mouth every 6 (six) hours as needed for nausea. 15 tablet 0   rizatriptan (MAXALT) 10 MG tablet Take 1 tablet (10 mg total)  by mouth as needed for migraine. May repeat in 2 hours if needed 10 tablet 8   rosuvastatin (CRESTOR) 5 MG tablet TAKE 1 TABLET(5 MG TOTAL) BY MOUTH 2(TWO) TIMES A WEEK 13 tablet 3   Semaglutide,0.25 or 0.5MG /DOS, (OZEMPIC, 0.25 OR 0.5 MG/DOSE,) 2 MG/3ML SOPN INJECT 0.5MG  UNDER THE SKIN ONCE A WEEK 9 mL 3   SYRINGE-NEEDLE, DISP, 3 ML 25G X 1" 3 ML MISC Use for monthly B12 injections 12 each 0   triamcinolone cream (KENALOG) 0.1 % Apply 1 application topically 2 (two) times daily as needed (for itching). 28.4 g 1   No current facility-administered medications on file prior to visit.   Allergies  Allergen Reactions   Chicken Allergy Anaphylaxis and Shortness Of Breath   Covid-19 Mrna Vacc (Moderna) Anaphylaxis   Other Itching, Swelling and Other (See Comments)    NUTS (of ANY kind): Lips and mouth swell, but no breathing impairment & migraines and eczema are triggered   Propofol Shortness Of Breath    SOB, chest tightness, wheezing after colonoscopy on 09-18-15   Basaglar Kwikpen [Insulin Glargine] Palpitations and Hypertension   Adhesive [Tape] Other (See Comments)    Makes the skin VERY RED    Gluten Meal Diarrhea   Peanut-Containing Drug Products Itching, Swelling and Other (See Comments)    Lips and mouth swell, but no breathing impairment & migraines and eczema are triggered    Novolog [Insulin Aspart] Rash   Tyler Aas [Insulin Degludec] Rash    eczema   Victoza [Liraglutide] Rash   Family History  Problem Relation Age of Onset   Diabetes Mother    Hyperlipidemia Mother    CAD Mother    CVA Mother  Diabetes Father    Hyperlipidemia Father    Coronary artery disease Father    Hypertension Father    Cancer Father        esoph   Stroke Father    Cancer Maternal Grandfather 77       Prostate and Lung   Diabetes Sister        Gestational   Graves' disease Maternal Aunt    Cancer Maternal Uncle        Brain   Stroke Paternal Grandmother    Heart disease Paternal  Grandmother    Stroke Paternal Grandfather    Heart disease Paternal Grandfather    Diabetes Sister        Gestational and Type II   Hashimoto's thyroiditis Cousin    Cancer Maternal Uncle        Brain   Objective:   Physical Exam There were no vitals taken for this visit.  Wt Readings from Last 3 Encounters:  06/27/22 173 lb 6.4 oz (78.7 kg)  05/20/22 178 lb (80.7 kg)  04/08/22 181 lb (82.1 kg)   Constitutional: overweight, in NAD Eyes: EOMI, no exophthalmos ENT: no thyromegaly, no cervical lymphadenopathy Cardiovascular: RRR, No MRG Respiratory: CTA B Musculoskeletal: no deformities Skin: no rashes Neurological: no tremor with outstretched hands  Assessment:     1. DM2, insulin-dependent, uncontrolled, without complications - r/o autoimmunity Component     Latest Ref Rng 08/17/2012  Glutamic Acid Decarb Ab     <=1.0 U/mL <1.0  Pancreatic Islet Cell Antibody     < 5 JDF Units <5  C-Peptide     0.80 - 3.90 ng/mL 1.84  Glucose     70 - 99 mg/dL 104 (H)     2. Overweight BMI Classification: < 18.5 underweight  18.5-24.9 normal weight  25.0-29.9 overweight  30.0-34.9 class I obesity  35.0-39.9 class II obesity  ? 40.0 class III obesity   3. HL  Plan:     1. Patient with longstanding, previously uncontrolled, type 2 diabetes, on metformin, basal insulin and weekly GLP-1 receptor agonist with significant improvement in control after starting Ozempic.  At last visit, I advised her to only take her regular insulin before larger meals but we did increase her NPH slightly.  Sugars appears to be fluctuating in the middle of the target range or slightly above.  She had higher blood sugars in the morning and upon questioning, this was due to drinking coffee with creamer.  I advised her to switch from creamer to almond milk or at least to switch back to the unsweetened creamer. CGM interpretation: -At today's visit, we reviewed her CGM downloads: It appears that *** of values  are in target range (goal >70%), while *** are higher than 180 (goal <25%), and *** are lower than 70 (goal <4%).  The calculated average blood sugar is ***.  The projected HbA1c for the next 3 months (GMI) is ***. -Reviewing the CGM trends, ***  - I suggested to:  Patient Instructions  Please continue: - NPH 10 units 2x a day - Regular insulin  4-6 units only before large meals. - Metformin 1000 mg 2x a day with meals - Ozempic 0.5 mg weekly  Please return in 4 months.  - we checked her HbA1c: 7%  - advised to check sugars at different times of the day - 4x a day, rotating check times - advised for yearly eye exams >> she is UTD - return to clinic in 4 months  2. Overweight -out of the obesity category  -We started Ozempic initially poorly tolerated but she continued with it and now tolerated  well. -She lost approximately 87 pounds after starting Ozempic (of which 17 before last visit).  3. HL -Reviewed latest lipid panel from 05/2022: All fractions at goal: Lab Results  Component Value Date   CHOL 141 05/20/2022   HDL 64.20 05/20/2022   LDLCALC 62 05/20/2022   TRIG 73.0 05/20/2022   CHOLHDL 2 05/20/2022  -Continues on Crestor 5 mg twice a week.  She does have muscle and joint aches but she does not feel that these are related to the statin, but to her fibromyalgia.  Philemon Kingdom, MD PhD [facility]

## 2022-10-31 ENCOUNTER — Encounter: Payer: Self-pay | Admitting: Internal Medicine

## 2022-10-31 ENCOUNTER — Encounter: Payer: Self-pay | Admitting: Hematology

## 2022-10-31 ENCOUNTER — Ambulatory Visit (INDEPENDENT_AMBULATORY_CARE_PROVIDER_SITE_OTHER): Payer: 59 | Admitting: Internal Medicine

## 2022-10-31 VITALS — BP 120/84 | HR 82 | Ht 64.0 in | Wt 186.4 lb

## 2022-10-31 DIAGNOSIS — E785 Hyperlipidemia, unspecified: Secondary | ICD-10-CM | POA: Diagnosis not present

## 2022-10-31 DIAGNOSIS — E1149 Type 2 diabetes mellitus with other diabetic neurological complication: Secondary | ICD-10-CM | POA: Diagnosis not present

## 2022-10-31 DIAGNOSIS — E663 Overweight: Secondary | ICD-10-CM

## 2022-10-31 LAB — POCT GLYCOSYLATED HEMOGLOBIN (HGB A1C): Hemoglobin A1C: 6.5 % — AB (ref 4.0–5.6)

## 2022-10-31 NOTE — Patient Instructions (Addendum)
  Please decrease: - NPH to 6 units in am and 4 units at night Try to continue to reduce the dose until you possibly come off.  Continue: {- Regular insulin  4-6 units only before large meals] - Metformin 1000 mg 2x a day with meals - Ozempic 0.5 mg weekly  Please return in 4 months.

## 2022-10-31 NOTE — Progress Notes (Unsigned)
=ubjective:     Patient ID: Lisa Duran, female   DOB: 07-Nov-1980, 42 y.o.   MRN: [id number]  HPI Lisa Duran is a pleasant 42 y.o.  woman returning for f/u for DM2, insulin dependent, uncontrolled, dx [id number], w/o complications. Last visit 5 months ago.  Interim history: No increased urination, blurry vision, nausea, chest pain.  She has a torn meniscus and had surgery since last visit - 09/06/2022.  She had several weeks of inactivity but now started to walk.  She did gain 13 pounds since last visit. After starting Ozempic, she had constipation, gas, nausea, but these improved.  She continues on Pepcid and Mylanta prn. She ~70 pounds on Ozempic and feeling much better.  Reviewed HbA1c levels: Lab Results  Component Value Date   HGBA1C 6.9 (A) 06/27/2022   HGBA1C 7.0 (A) 02/20/2022   HGBA1C 6.9 (A) 10/08/2021   HGBA1C 7.6 (A) 07/01/2021   HGBA1C 7.5 (A) 02/21/2021   HGBA1C 7.6 (A) 10/16/2020   HGBA1C 8.6 (A) 06/12/2020   HGBA1C 8.5 (A) 11/07/2019   HGBA1C 8.4 (A) 08/09/2019   HGBA1C 9.1 (A) 04/14/2019   She is on:   - Metformin 1000 mg 2x a day  - NPH 26-28 units and 22-24 units at bedtime >> 0-12 units 2x a day >> 10 >> 12 >> 6-8 units 2x a day [- Regular  4-6 units before large meals only - rarely] - Ozempic 0.5 mg weekly due to muscle and joint aches We stopped glipizide ER and also Januvia. We tried Cycloset 0.8 mg daily >> stopped end of 11/2016 b/c GERD/AP/C We tried Trulicity >> could not tolerate it: N/V/AP. She restarted Januvia 09/2016. She was on Invokana 100 mg in am (started 12/2014) >> 2 yeast inf >> tx with Diflucan; she was exhausted and had nocturia >> stopped 03/12/2015 Tried Victoza 2 mo ago >> AP, severe GERD, inj site rxn Tried Januvia >> tolerated it well, but stopped when Invokana was suggested.  She was also on Actos, but taken off b/c good control. She had problems tolerating insulins in the past.  She was previously on Lantus and NovoLog.  Basaglar and  [place] caused eczema and also GI symptoms.  She has a [employer], structural.  She checks her sugars more than 4 times a day with her CGM:  Previously:  Previously:   She has hypoglycemia awareness in the 80s.  No CKD: Lab Results  Component Value Date   BUN 6 05/20/2022   Lab Results  Component Value Date   CREATININE 0.69 05/20/2022   No MAU: Lab Results  Component Value Date   MICRALBCREAT 0.6 05/20/2022   MICRALBCREAT 0.5 05/10/2021   MICRALBCREAT 28 05/09/2020   MICRALBCREAT 0.8 10/20/2018   MICRALBCREAT 0.5 02/03/2017   + HL: Lab Results  Component Value Date   CHOL 141 05/20/2022   HDL 64.20 05/20/2022   LDLCALC 62 05/20/2022   TRIG 73.0 05/20/2022   CHOLHDL 2 05/20/2022  On Crestor 5 mg 2x a week.  - Last eye exam 01/2022: reportedly No DR, IOP high - changed opthalmologists, - prev.[facility].  - Resolved numbness and tingling in hand and feet.  She felt much better on Cymbalta and Neurontin, but she is now off both -no return of symptoms.  Last foot exam 10/08/2021.  She had CP >> had a stress test 07/04/2015 >>  Normal.  She is on metoprolol for tachycardia. She has fibromyalgia.   She also has  a history of iron deficiency anemia and had iron infusions.   In 2019, she just got out of a 5-year relationship.  She was under a lot of stress.   Review of Systems + see HPI  Past Medical History:  Diagnosis Date   ADHD (attention deficit hyperactivity disorder)    ALLERGIC RHINITIS    Allergy    Anemia    ANXIETY    Depression    DIABETES MELLITUS, TYPE II    Fibromyalgia 2018   GERD (gastroesophageal reflux disease)    MIGRAINE, COMMON    Obesity, unspecified    OVARIAN CYST    Past Surgical History:  Procedure Laterality Date   NO PAST SURGERIES     Social History   Socioeconomic History   Marital status: Single    Spouse name: Not on file   Number of children: Not on file   Years of education: Not on file    Highest education level: Not on file  Occupational History   Not on file  Tobacco Use   Smoking status: Former    Types: Cigarettes    Quit date: 08/20/2002    Years since quitting: 20.2   Smokeless tobacco: Never   Tobacco comments:    single, separated from spouse 02/2010.   Vaping Use   Vaping Use: Never used  Substance and Sexual Activity   Alcohol use: Yes    Comment: couple of drinks a week   Drug use: No   Sexual activity: Not Currently    Birth control/protection: I.U.D.  Other Topics Concern   Not on file  Social History Narrative   Single, separated from spouse 02/2010   Social Determinants of Health   Financial Resource Strain: Not on file  Food Insecurity: Not on file  Transportation Needs: Not on file  Physical Activity: Not on file  Stress: Not on file  Social Connections: Not on file  Intimate Partner Violence: Not on file   Current Outpatient Medications on File Prior to Visit  Medication Sig Dispense Refill   ALPRAZolam (XANAX) 0.5 MG tablet TAKE 1 TABLET(0.5 MG) BY MOUTH THREE TIMES DAILY AS NEEDED FOR ANXIETY 60 tablet 5   aspirin 81 MG chewable tablet CHEW AND SWALLOW 1 TABLET(81 MG) BY MOUTH TWICE DAILY FOR BLOOD CLOT PREVENTION 180 tablet 0   celecoxib (CELEBREX) 100 MG capsule Take 1 capsule (100 mg total) by mouth 2 (two) times daily. 60 capsule 0   Continuous Blood Gluc Receiver ([place]) DEVI by Does not apply route.     Continuous Blood Gluc Sensor (FREESTYLE LIBRE 2 SENSOR) MISC APPLY EVERY 14 DAYS 2 each 2   Continuous Blood Gluc Transmit (DEXCOM G6 TRANSMITTER) MISC by Does not apply route.     cyanocobalamin (VITAMIN B12) 1000 MCG/ML injection Inject 1 mL (1,000 mcg total) into the muscle every 30 (thirty) days. 10 mL 1   dexmethylphenidate (FOCALIN) 10 MG tablet Take 2 tablets (20 mg total) by mouth 2 (two) times daily. 120 tablet 0   DULoxetine (CYMBALTA) 60 MG capsule Take 1 capsule (60 mg total) by mouth daily. 90 capsule 2    EPINEPHrine 0.3 mg/0.3 mL IJ SOAJ injection Use as needed for hypersensitivity reaction 1 each 0   Fexofenadine HCl (ALLEGRA ALLERGY PO) Take by mouth.     glucose blood (ONETOUCH VERIO) test strip Use 2x a day 200 each 3   Insulin NPH, Human,, Isophane, (HUMULIN N KWIKPEN) 100 UNIT/ML Kiwkpen Inject 10-12 Units into the  skin 2 (two) times daily. 30 mL 3   Insulin Pen Needle 32G X 4 MM MISC Use as instructed to administer injections 3X daily 200 each 1   insulin regular (HUMULIN R) 100 units/mL injection INJECT8-10 UNITS INTO THE SKIN THREE TIMES DAILY BEFORE MEALS. DISCARD OPEN VIAL AFTER 28 DAYS.     Insulin Syringe-Needle U-100 (B-D INS SYR ULTRAFINE .5CC/30G) 30G X 1/2" 0.5 ML MISC Use to inject insulin once daily. 100 each 11   levonorgestrel (MIRENA) 20 MCG/24HR IUD 1 Intra Uterine Device (1 each total) by Intrauterine route once. 1 each 0   metFORMIN (GLUCOPHAGE) 1000 MG tablet TAKE 2 TABLETS BY MOUTH DAILY WITH SUPPER. TAKE 1 TABLET BY MOUTH TWICE A DAY WITH A MEAL 180 tablet 3   methocarbamol (ROBAXIN) 500 MG tablet Take 1 tablet (500 mg total) by mouth every 8 (eight) hours as needed for muscle spasms. 30 tablet 1   metoprolol succinate (TOPROL-XL) 25 MG 24 hr tablet TAKE 1 TABLET(25 MG) BY MOUTH DAILY 90 tablet 1   nitrofurantoin, macrocrystal-monohydrate, (MACROBID) 100 MG capsule Take 1 capsule (100 mg total) by mouth 2 (two) times daily. 14 capsule 0   omeprazole (PRILOSEC) 40 MG capsule TAKE 1 CAPULE BY MOUTH BEFORE SUPPER DAILY 90 capsule 2   ondansetron (ZOFRAN ODT) 4 MG disintegrating tablet Take 1 tablet (4 mg total) by mouth every 8 (eight) hours as needed for nausea or vomiting. 20 tablet 0   oxyCODONE (ROXICODONE) 5 MG immediate release tablet Take 1 tablet (5 mg total) by mouth every 4 (four) hours as needed for severe pain. 30 tablet 0   promethazine (PHENERGAN) 25 MG tablet Take 1 tablet (25 mg total) by mouth every 6 (six) hours as needed for nausea. 15 tablet 0    rizatriptan (MAXALT) 10 MG tablet Take 1 tablet (10 mg total) by mouth as needed for migraine. May repeat in 2 hours if needed 10 tablet 8   rosuvastatin (CRESTOR) 5 MG tablet TAKE 1 TABLET(5 MG TOTAL) BY MOUTH 2(TWO) TIMES A WEEK 13 tablet 3   Semaglutide,0.25 or 0.5MG /DOS, (OZEMPIC, 0.25 OR 0.5 MG/DOSE,) 2 MG/3ML SOPN INJECT 0.5MG  UNDER THE SKIN ONCE A WEEK 9 mL 3   SYRINGE-NEEDLE, DISP, 3 ML 25G X 1" 3 ML MISC Use for monthly B12 injections 12 each 0   triamcinolone cream (KENALOG) 0.1 % Apply 1 application topically 2 (two) times daily as needed (for itching). 28.4 g 1   No current facility-administered medications on file prior to visit.   Allergies  Allergen Reactions   Chicken Allergy Anaphylaxis and Shortness Of Breath   Covid-19 Mrna Vacc (Moderna) Anaphylaxis   Other Itching, Swelling and Other (See Comments)    NUTS (of ANY kind): Lips and mouth swell, but no breathing impairment & migraines and eczema are triggered   Propofol Shortness Of Breath    SOB, chest tightness, wheezing after colonoscopy on 09-18-15   Basaglar Kwikpen [Insulin Glargine] Palpitations and Hypertension   Adhesive [Tape] Other (See Comments)    Makes the skin VERY RED    Gluten Meal Diarrhea   Peanut-Containing Drug Products Itching, Swelling and Other (See Comments)    Lips and mouth swell, but no breathing impairment & migraines and eczema are triggered    Novolog [Insulin Aspart] Rash   Tyler Aas [Insulin Degludec] Rash    eczema   Victoza [Liraglutide] Rash   Family History  Problem Relation Age of Onset   Diabetes Mother    Hyperlipidemia Mother  CAD Mother    CVA Mother    Diabetes Father    Hyperlipidemia Father    Coronary artery disease Father    Hypertension Father    Cancer Father        esoph   Stroke Father    Cancer Maternal Grandfather 80       Prostate and Lung   Diabetes Sister        Gestational   Graves' disease Maternal Aunt    Cancer Maternal Uncle        Brain    Stroke Paternal Grandmother    Heart disease Paternal Grandmother    Stroke Paternal Grandfather    Heart disease Paternal Grandfather    Diabetes Sister        Gestational and Type II   Hashimoto's thyroiditis Cousin    Cancer Maternal Uncle        Brain   Objective:   Physical Exam BP 120/84 (BP Location: Right Arm, Patient Position: Sitting, Cuff Size: Normal)   Pulse 82   Ht 5\' 4"  (1.626 m)   Wt 186 lb 6.4 oz (84.6 kg)   SpO2 97%   BMI 32.00 kg/m   Wt Readings from Last 3 Encounters:  10/31/22 186 lb 6.4 oz (84.6 kg)  06/27/22 173 lb 6.4 oz (78.7 kg)  05/20/22 178 lb (80.7 kg)   Constitutional: overweight, in NAD Eyes: EOMI, no exophthalmos ENT: no thyromegaly, no cervical lymphadenopathy Cardiovascular: RRR, No MRG Respiratory: CTA B Musculoskeletal: no deformities Skin: no rashes Neurological: no tremor with outstretched hands  Assessment:     1. DM2, insulin-dependent, uncontrolled, without complications - r/o autoimmunity Component     Latest Ref Rng 08/17/2012  Glutamic Acid Decarb Ab     <=1.0 U/mL <1.0  Pancreatic Islet Cell Antibody     < 5 JDF Units <5  C-Peptide     0.80 - 3.90 ng/mL 1.84  Glucose     70 - 99 mg/dL 104 (H)     2. Overweight  3. HL  Plan:     1. Patient with longstanding, previously uncontrolled, type 2 diabetes, on metformin, basal insulin and weekly GLP-1 receptor agonist with significant improvement in control after starting Ozempic.  At last visit, I advised her to only take her regular insulin before larger meals but we did increase her NPH slightly.  Sugars appears to be fluctuating in the middle of the target range or slightly above.  She had higher blood sugars in the morning and upon questioning she was drinking coffee with creamer before these hyperglycemic peaks.  I advised her to switch from creamer to almond milk or at least to switch back to unsweetened creamer CGM interpretation: -At today's visit, we reviewed her CGM  downloads: It appears that 98% of values are in target range (goal >70%), while 2% are higher than 180 (goal <25%), and 0% are lower than 70 (goal <4%).  The the calculated average blood sugar is 116.  The projected HbA1c for the next 3 months (GMI) is 6.1%. -Reviewing the CGM trends, sugars are fluctuating within the target range but getting closer to the lower limit of the target range, 70, at night, and occasionally lower during the day.  At today's visit, I advised her to start reducing the NPH dose at night and if the sugars remain on the low side, to continue to back off the insulin doses.  I will check her insulin production again and if this is robust, she  can continue to taper the insulin down to off.  Will not recheck her antipancreatic antibodies.  We checked these in 08/2012 and they were not elevated. - I suggested to:  Patient Instructions  Please decrease: - NPH to 6 units in am and 4 units at night Try to continue to reduce the dose until you possibly come off.  Continue: {- Regular insulin  4-6 units only before large meals] - Metformin 1000 mg 2x a day with meals - Ozempic 0.5 mg weekly  Please return in 4 months.  - we checked her HbA1c: 6.5% (lower) - advised to check sugars at different times of the day - 4x a day, rotating check times - advised for yearly eye exams >> she is UTD - return to clinic in 4 months  2. Overweight -out of the obesity category  -We started Ozempic initially poorly tolerated but she continues with it and now tolerated well -She lost approximately 70 pounds after starting Ozempic (17 before last visit); she gained 13 pounds since last visit, most likely due to her recent surgery and immobility.  However, she is now back to walking.  3. HL -reviewed latest lipid panel from 05/2022: Fractions at goal: Lab Results  Component Value Date   CHOL 141 05/20/2022   HDL 64.20 05/20/2022   LDLCALC 62 05/20/2022   TRIG 73.0 05/20/2022   CHOLHDL 2  05/20/2022  -Continues on Crestor 5 mg twice a week.  She does have muscle and joint aches but she does not feel that these are related to the statin, but due to her fibromyalgia.    Orders Placed This Encounter  Procedures   C-peptide   Glucose, fasting   POCT glycosylated hemoglobin (Hb A1C)   Philemon Kingdom, MD PhD [facility] Endocrinology

## 2022-11-01 LAB — C-PEPTIDE: C-Peptide: 1.47 ng/mL (ref 0.80–3.85)

## 2022-11-01 LAB — GLUCOSE, FASTING: Glucose, Bld: 84 mg/dL (ref 65–99)

## 2022-11-03 ENCOUNTER — Encounter: Payer: Self-pay | Admitting: Physical Therapy

## 2022-11-03 ENCOUNTER — Ambulatory Visit: Payer: 59 | Admitting: Physical Therapy

## 2022-11-03 DIAGNOSIS — R600 Localized edema: Secondary | ICD-10-CM

## 2022-11-03 DIAGNOSIS — M25662 Stiffness of left knee, not elsewhere classified: Secondary | ICD-10-CM

## 2022-11-03 DIAGNOSIS — M25562 Pain in left knee: Secondary | ICD-10-CM

## 2022-11-03 DIAGNOSIS — R262 Difficulty in walking, not elsewhere classified: Secondary | ICD-10-CM

## 2022-11-03 NOTE — Therapy (Signed)
OUTPATIENT PHYSICAL THERAPY LOWER EXTREMITY TREATMENT   Patient Name: Lisa Duran MRN: [id number] DOB:Mar 04, 1981, 42 y.o., female Today's Date: 11/03/2022  END OF SESSION:  PT End of Session - 11/03/22 1014     Visit Number 5    Date for PT Re-Evaluation 01/06/23    PT Start Time 1015    PT Stop Time 1100    PT Time Calculation (min) 45 min    Activity Tolerance Patient tolerated treatment well    Behavior During Therapy WFL for tasks assessed/performed             Past Medical History:  Diagnosis Date   ADHD (attention deficit hyperactivity disorder)    ALLERGIC RHINITIS    Allergy    Anemia    ANXIETY    Depression    DIABETES MELLITUS, TYPE II    Fibromyalgia 2018   GERD (gastroesophageal reflux disease)    MIGRAINE, COMMON    Obesity, unspecified    OVARIAN CYST    Past Surgical History:  Procedure Laterality Date   NO PAST SURGERIES     Patient Active Problem List   Diagnosis Date Noted   B12 deficiency 05/09/2021   Dyslipidemia 04/14/2019   Enteritis 01/12/2019   Sleep walking disorder 04/06/2017   Iron deficiency anemia 03/27/2017   Fibromyalgia 02/03/2017   GERD (gastroesophageal reflux disease) 06/24/2015   Eczema 06/22/2015   Depression 06/22/2015   Tachycardia 06/16/2014   Food allergy    ADHD (attention deficit hyperactivity disorder) 01/27/2011   Anxiety 02/20/2010   ALLERGIC RHINITIS 11/21/2009   OVARIAN CYST 11/21/2009   Diabetes mellitus type 2 with neurological manifestations ([place]) 11/20/2009   Class 2 obesity in adult 11/20/2009   Migraine without aura 11/20/2009    PCP: Billey Gosling, MD  REFERRING PROVIDER: Mittie Bodo, MD  REFERRING DIAG: s/p left meniscus repair  THERAPY DIAG:  Acute pain of left knee  Stiffness of left knee, not elsewhere classified  Difficulty in walking, not elsewhere classified  Localized edema  Rationale for Evaluation and Treatment: Rehabilitation  ONSET DATE: 09/01/22  SUBJECTIVE:   SUBJECTIVE  STATEMENT: Doing well. Going down hills are the worst. Knee buckled once  PERTINENT HISTORY: See abo e PAIN:  Are you having pain? Yes: NPRS scale: 2/10 Pain location: across the L knee Pain description: sore Aggravating factors: weight bearing , bending pain up to 6/10 Relieving factors: ice, rest, Tylenol, pain can get down to a 1/10  PRECAUTIONS: None  WEIGHT BEARING RESTRICTIONS: No  FALLS:  Has patient fallen in last 6 months? Yes. Number of falls 1  LIVING ENVIRONMENT: Lives with: lives with their family Lives in: House/apartment Stairs: Yes: Internal: 14 steps; can reach both Has following equipment at home: Walker - 2 wheeled  OCCUPATION: sitting mostly, some walking, lifting up to 30#  PLOF: Independent and walking dog 45 minutes a day, some small hikes  PATIENT GOALS: walk dog, hike, have no pain  NEXT MD VISIT: March 15  OBJECTIVE:   DIAGNOSTIC FINDINGS: see above  PATIENT SURVEYS:  FOTO 20.5  COGNITION: Overall cognitive status: Within functional limits for tasks assessed     SENSATION: WFL  EDEMA:  Circumferential: right 40cm mid patella, left 41 cm  PALPATION: Non tender, mild warmth and edema  LOWER EXTREMITY ROM:  Active ROM Right eval Left eval L  11/03/22  Hip flexion     Hip extension     Hip abduction     Hip adduction     Hip  internal rotation     Hip external rotation     Knee flexion  110 120  Knee extension  0 0  Ankle dorsiflexion     Ankle plantarflexion     Ankle inversion     Ankle eversion      (Blank rows = not tested)  LOWER EXTREMITY MMT:  MMT Right eval Left eval  Hip flexion    Hip extension    Hip abduction    Hip adduction    Hip internal rotation    Hip external rotation    Knee flexion  4-  Knee extension  4-  Ankle dorsiflexion    Ankle plantarflexion    Ankle inversion    Ankle eversion     (Blank rows = not tested) FUNCTIONAL TESTS:  Timed up and go (TUG): 18 seconds  GAIT: Distance  walked: 50 feet Assistive device utilized: Environmental consultant - 2 wheeled Level of assistance: CGA Comments: step to, very poor weight bearing, touching the toe down, but not the heel, unsure and scared to walk, unable to do stairs   TODAY'S TREATMENT:                                                                                                                              DATE:  11/03/22 Bike L2.5 x 6 min Resisted gait 40lb all directions x3 Attempted 6 in step ups with and without resistance but stopped due to pain 4 in step ups 2x10 Leg press 30lb 2x10 Alt cone taps from airex 2x10 HS curls 25lb x 10  pain reported medial L knee Leg Ext 5 lb 2x10 Hamstring strech  10/27/22 NuStep L 4 x 5 min  Gait outside back building, feels a tight band across the front of the knee, worst during down hill 4in lateral step ups [id number] 6in step ups LLE 2x10 30lb resisted gait 4 way x3  Heel raises 2x10 LLE LAQ 2lb 3x10 LLE HS curls green 2x12  10/20/22 Bike L2 x 6 min  LLE PROM flex and Ext LLE LAQ 2lb 2x10 LLE hamstring curls red 2x10 20lb resisted gait 4 way x3  4 inch in step ups x10 each 4 in lateral step ups x10 each Heel raises black bar 2x10 Calf stretch S2S 2x10    10/13/22 Bike L 2 x 6 min  Lle PROM flex and Ext Supine LLE SLR x10, 2lb x10 Supine LLE SAQ x10, 2lb 2x10  Bridges 2x10 S2S 2x5 elevated mat, some anterior L knee pain  Standing march w/ and w/o RX x 10 each  Gait SPC 56ft x2  Gait 75lb  then 150 ft being cautious not pivot on LLE 4 in step ups with rail x5 then x10  Heel raises 2x10  PATIENT EDUCATION:  Education details: POC/HEP/stairs education and practice Person educated: Patient Education method: Explanation, Demonstration, Tactile cues, Verbal cues, and Handouts Education comprehension: verbalized understanding, returned demonstration, verbal cues required, and tactile cues  required  HOME EXERCISE PROGRAM: Access Code: [email or web address] URL:  [email or web address] Date: 10/08/2022 Prepared by: Lum Babe  Exercises - Supine Active Straight Leg Raise  - 2 x daily - 7 x weekly - 2 sets - 10 reps - 3 hold - Sidelying Hip Abduction  - 2 x daily - 7 x weekly - 2 sets - 10 reps - 3 hold - Sidelying Hip Adduction  - 2 x daily - 7 x weekly - 2 sets - 10 reps - 3 hold - Prone Hip Extension  - 2 x daily - 7 x weekly - 2 sets - 10 reps - 3 hold  ASSESSMENT:  CLINICAL IMPRESSION: Patient is a 42 y.o. female who was seen today for physical therapy treatment for left medial meniscus repair. Pt has progressed increasing her L knee AROM. L knee pain reported during the eccentric control phase fo step downs. Some LLE shaking noted with sit to stands. Positive response to HS stretch. Added HS stretch to HEP  OBJECTIVE IMPAIRMENTS: Abnormal gait, cardiopulmonary status limiting activity, decreased activity tolerance, decreased balance, decreased endurance, decreased mobility, difficulty walking, decreased ROM, decreased strength, increased edema, impaired flexibility, and pain.  REHAB POTENTIAL: Good  CLINICAL DECISION MAKING: Stable/uncomplicated  EVALUATION COMPLEXITY: Low   GOALS: Goals reviewed with patient? Yes  SHORT TERM GOALS: Target date: 10/19/22 Independent with initial HEP Goal status: Met 10/27/22 LONG TERM GOALS: Target date: 01/06/23  Independent and safe with advanced HEP Goal status: INITIAL  2.  Resume walking 20 minutes a day Goal status: Progressing 10/23/22  3.  Increase left knee AROM to WNL's Goal status: INITIAL  4.  Decrease TUG time to 10 seconds Goal status: INITIAL  5.  Walk without device and do stairs step over step Goal status: Partly met 10/27/22  6.  No pain with return to work Goal status: Partly Met 10/27/22   PLAN:  PT FREQUENCY: 1-2x/week  PT DURATION: 12 weeks  PLANNED INTERVENTIONS: Therapeutic exercises, Therapeutic activity, Neuromuscular re-education, Balance  training, Gait training, Patient/Family education, Self Care, Joint mobilization, Stair training, Vasopneumatic device, and Manual therapy  PLAN FOR NEXT SESSION: start bike, closed chain activities, gait WBAT and progress to no device   Scot Jun, PTA 11/03/2022, 10:15 AM

## 2022-11-05 ENCOUNTER — Other Ambulatory Visit: Payer: Self-pay | Admitting: Surgical

## 2022-11-10 ENCOUNTER — Encounter: Payer: Self-pay | Admitting: Physical Therapy

## 2022-11-10 ENCOUNTER — Ambulatory Visit: Payer: 59 | Attending: Orthopedic Surgery | Admitting: Physical Therapy

## 2022-11-10 DIAGNOSIS — M25562 Pain in left knee: Secondary | ICD-10-CM | POA: Insufficient documentation

## 2022-11-10 DIAGNOSIS — M25662 Stiffness of left knee, not elsewhere classified: Secondary | ICD-10-CM | POA: Diagnosis present

## 2022-11-10 DIAGNOSIS — R262 Difficulty in walking, not elsewhere classified: Secondary | ICD-10-CM | POA: Insufficient documentation

## 2022-11-10 DIAGNOSIS — R600 Localized edema: Secondary | ICD-10-CM | POA: Insufficient documentation

## 2022-11-10 NOTE — Therapy (Signed)
OUTPATIENT PHYSICAL THERAPY LOWER EXTREMITY TREATMENT   Patient Name: Lisa Duran MRN: [id number] DOB:1981-06-16, 42 y.o., female Today's Date: 11/10/2022  END OF SESSION:  PT End of Session - 11/10/22 1014     Visit Number 6    Date for PT Re-Evaluation 01/06/23    PT Start Time 1015    PT Stop Time 1100    PT Time Calculation (min) 45 min    Activity Tolerance Patient tolerated treatment well    Behavior During Therapy WFL for tasks assessed/performed             Past Medical History:  Diagnosis Date   ADHD (attention deficit hyperactivity disorder)    ALLERGIC RHINITIS    Allergy    Anemia    ANXIETY    Depression    DIABETES MELLITUS, TYPE II    Fibromyalgia 2018   GERD (gastroesophageal reflux disease)    MIGRAINE, COMMON    Obesity, unspecified    OVARIAN CYST    Past Surgical History:  Procedure Laterality Date   NO PAST SURGERIES     Patient Active Problem List   Diagnosis Date Noted   B12 deficiency 05/09/2021   Dyslipidemia 04/14/2019   Enteritis 01/12/2019   Sleep walking disorder 04/06/2017   Iron deficiency anemia 03/27/2017   Fibromyalgia 02/03/2017   GERD (gastroesophageal reflux disease) 06/24/2015   Eczema 06/22/2015   Depression 06/22/2015   Tachycardia 06/16/2014   Food allergy    ADHD (attention deficit hyperactivity disorder) 01/27/2011   Anxiety 02/20/2010   ALLERGIC RHINITIS 11/21/2009   OVARIAN CYST 11/21/2009   Diabetes mellitus type 2 with neurological manifestations 11/20/2009   Class 2 obesity in adult 11/20/2009   Migraine without aura 11/20/2009    PCP: Billey Gosling, MD  REFERRING PROVIDER: Mittie Bodo, MD  REFERRING DIAG: s/p left meniscus repair  THERAPY DIAG:  Acute pain of left knee  Stiffness of left knee, not elsewhere classified  Difficulty in walking, not elsewhere classified  Localized edema  Rationale for Evaluation and Treatment: Rehabilitation  ONSET DATE: 09/01/22  SUBJECTIVE:   SUBJECTIVE  STATEMENT: "Having a fibromyalgia fair up" "Its been doing good" One episode of pain with a side step  PERTINENT HISTORY: See abo e PAIN:  Are you having pain? Yes: NPRS scale: 1/10 Pain location: across the L knee Pain description: sore Aggravating factors: weight bearing , bending pain up to 6/10 Relieving factors: ice, rest, Tylenol, pain can get down to a 1/10  PRECAUTIONS: None  WEIGHT BEARING RESTRICTIONS: No  FALLS:  Has patient fallen in last 6 months? Yes. Number of falls 1  LIVING ENVIRONMENT: Lives with: lives with their family Lives in: House/apartment Stairs: Yes: Internal: 14 steps; can reach both Has following equipment at home: Walker - 2 wheeled  OCCUPATION: sitting mostly, some walking, lifting up to 30#  PLOF: Independent and walking dog 45 minutes a day, some small hikes  PATIENT GOALS: walk dog, hike, have no pain  NEXT MD VISIT: March 15  OBJECTIVE:   DIAGNOSTIC FINDINGS: see above  PATIENT SURVEYS:  FOTO 20.5  COGNITION: Overall cognitive status: Within functional limits for tasks assessed     SENSATION: WFL  EDEMA:  Circumferential: right 40cm mid patella, left 41 cm  PALPATION: Non tender, mild warmth and edema  LOWER EXTREMITY ROM:  Active ROM Right eval Left eval L  11/03/22  Hip flexion     Hip extension     Hip abduction     Hip adduction  Hip internal rotation     Hip external rotation     Knee flexion  110 120  Knee extension  0 0  Ankle dorsiflexion     Ankle plantarflexion     Ankle inversion     Ankle eversion      (Blank rows = not tested)  LOWER EXTREMITY MMT:  MMT Right eval Left eval  Hip flexion    Hip extension    Hip abduction    Hip adduction    Hip internal rotation    Hip external rotation    Knee flexion  4-  Knee extension  4-  Ankle dorsiflexion    Ankle plantarflexion    Ankle inversion    Ankle eversion     (Blank rows = not tested) FUNCTIONAL TESTS:  Timed up and go (TUG): 18  seconds  GAIT: Distance walked: 50 feet Assistive device utilized: Walker - 2 wheeled Level of assistance: CGA Comments: step to, very poor weight bearing, touching the toe down, but not the heel, unsure and scared to walk, unable to do stairs   TODAY'S TREATMENT:                                                                                                                              DATE:  11/10/22 NuStep L4 x 6 min HS curls 25lb x 10  pain reported medial L knee Leg Ext 5 lb 2x10 S2S on airex 3x5 Alt cone taps from airex 2x10 Side steps and tandem walking on balance beam in //   11/03/22 Bike L2.5 x 6 min Resisted gait 40lb all directions x3 Attempted 6 in step ups with and without resistance but stopped due to pain 4 in step ups 2x10 Leg press 30lb 2x10 Alt cone taps from airex 2x10 HS curls 25lb x 10  pain reported medial L knee Leg Ext 5 lb 2x10 Hamstring strech  10/27/22 NuStep L 4 x 5 min  Gait outside back building, feels a tight band across the front of the knee, worst during down hill 4in lateral step ups LLEx10 6in step ups LLE 2x10 30lb resisted gait 4 way x3  Heel raises 2x10 LLE LAQ 2lb 3x10 LLE HS curls green 2x12  10/20/22 Bike L2 x 6 min  LLE PROM flex and Ext LLE LAQ 2lb 2x10 LLE hamstring curls red 2x10 20lb resisted gait 4 way x3  4 inch in step ups x10 each 4 in lateral step ups x10 each Heel raises black bar 2x10 Calf stretch S2S 2x10    10/13/22 Bike L 2 x 6 min  Lle PROM flex and Ext Supine LLE SLR x10, 2lb x10 Supine LLE SAQ x10, 2lb 2x10  Bridges 2x10 S2S 2x5 elevated mat, some anterior L knee pain  Standing march w/ and w/o RX x 10 each  Gait SPC 73ft x2  Gait 75lb  then 150 ft being cautious not pivot on LLE 4 in step  ups with rail x5 then x10  Heel raises 2x10  PATIENT EDUCATION:  Education details: Photographer Person educated: Patient Education method: Explanation, Demonstration, Tactile cues,  Verbal cues, and Handouts Education comprehension: verbalized understanding, returned demonstration, verbal cues required, and tactile cues required  HOME EXERCISE PROGRAM: Access Code: [email or web address] URL: [email or web address] Date: 10/08/2022 Prepared by: Lum Babe  Exercises - Supine Active Straight Leg Raise  - 2 x daily - 7 x weekly - 2 sets - 10 reps - 3 hold - Sidelying Hip Abduction  - 2 x daily - 7 x weekly - 2 sets - 10 reps - 3 hold - Sidelying Hip Adduction  - 2 x daily - 7 x weekly - 2 sets - 10 reps - 3 hold - Prone Hip Extension  - 2 x daily - 7 x weekly - 2 sets - 10 reps - 3 hold  ASSESSMENT:  CLINICAL IMPRESSION: Patient is a 42 y.o. female who was seen today for physical therapy treatment for left medial meniscus repair. She enters with reports of increase fatigue from fibromyalgia flair up. Session was back down with less intense interventions with extra rest. Some instability with tandem walking.     OBJECTIVE IMPAIRMENTS: Abnormal gait, cardiopulmonary status limiting activity, decreased activity tolerance, decreased balance, decreased endurance, decreased mobility, difficulty walking, decreased ROM, decreased strength, increased edema, impaired flexibility, and pain.  REHAB POTENTIAL: Good  CLINICAL DECISION MAKING: Stable/uncomplicated  EVALUATION COMPLEXITY: Low   GOALS: Goals reviewed with patient? Yes  SHORT TERM GOALS: Target date: 10/19/22 Independent with initial HEP Goal status: Met 10/27/22 LONG TERM GOALS: Target date: 01/06/23  Independent and safe with advanced HEP Goal status: INITIAL  2.  Resume walking 20 minutes a day Goal status: Progressing 10/23/22  3.  Increase left knee AROM to WNL's Goal status: Progressing  4.  Decrease TUG time to 10 seconds Goal status: INITIAL  5.  Walk without device and do stairs step over step Goal status: Partly met 10/27/22  6.  No pain with return to work Goal status: Partly Met  10/27/22   PLAN:  PT FREQUENCY: 1-2x/week  PT DURATION: 12 weeks  PLANNED INTERVENTIONS: Therapeutic exercises, Therapeutic activity, Neuromuscular re-education, Balance training, Gait training, Patient/Family education, Self Care, Joint mobilization, Stair training, Vasopneumatic device, and Manual therapy  PLAN FOR NEXT SESSION: start bike, closed chain activities, gait WBAT and progress to no device   Scot Jun, PTA 11/10/2022, 10:17 AM

## 2022-11-17 ENCOUNTER — Telehealth: Payer: Self-pay | Admitting: Surgical

## 2022-11-17 ENCOUNTER — Ambulatory Visit: Payer: 59 | Admitting: Physical Therapy

## 2022-11-17 ENCOUNTER — Encounter: Payer: Self-pay | Admitting: Physical Therapy

## 2022-11-17 DIAGNOSIS — R600 Localized edema: Secondary | ICD-10-CM

## 2022-11-17 DIAGNOSIS — M25562 Pain in left knee: Secondary | ICD-10-CM

## 2022-11-17 DIAGNOSIS — M25662 Stiffness of left knee, not elsewhere classified: Secondary | ICD-10-CM

## 2022-11-17 DIAGNOSIS — R262 Difficulty in walking, not elsewhere classified: Secondary | ICD-10-CM

## 2022-11-17 NOTE — Therapy (Signed)
OUTPATIENT PHYSICAL THERAPY LOWER EXTREMITY TREATMENT   Patient Name: Lisa Duran MRN: [id number] DOB:August 25, 1980, 42 y.o., female Today's Date: 11/17/2022  END OF SESSION:  PT End of Session - 11/17/22 1014     Visit Number 7    Date for PT Re-Evaluation 01/06/23    PT Start Time 1014    PT Stop Time 1100    PT Time Calculation (min) 46 min    Activity Tolerance Patient tolerated treatment well    Behavior During Therapy WFL for tasks assessed/performed             Past Medical History:  Diagnosis Date   ADHD (attention deficit hyperactivity disorder)    ALLERGIC RHINITIS    Allergy    Anemia    ANXIETY    Depression    DIABETES MELLITUS, TYPE II    Fibromyalgia 2018   GERD (gastroesophageal reflux disease)    MIGRAINE, COMMON    Obesity, unspecified    OVARIAN CYST    Past Surgical History:  Procedure Laterality Date   NO PAST SURGERIES     Patient Active Problem List   Diagnosis Date Noted   B12 deficiency 05/09/2021   Dyslipidemia 04/14/2019   Enteritis 01/12/2019   Sleep walking disorder 04/06/2017   Iron deficiency anemia 03/27/2017   Fibromyalgia 02/03/2017   GERD (gastroesophageal reflux disease) 06/24/2015   Eczema 06/22/2015   Depression 06/22/2015   Tachycardia 06/16/2014   Food allergy    ADHD (attention deficit hyperactivity disorder) 01/27/2011   Anxiety 02/20/2010   ALLERGIC RHINITIS 11/21/2009   OVARIAN CYST 11/21/2009   Diabetes mellitus type 2 with neurological manifestations 11/20/2009   Class 2 obesity in adult 11/20/2009   Migraine without aura 11/20/2009    PCP: Cheryll CockayneStacy Burns, MD  REFERRING PROVIDER: Keitha ButteGS Dean, MD  REFERRING DIAG: s/p left meniscus repair  THERAPY DIAG:  Acute pain of left knee  Stiffness of left knee, not elsewhere classified  Difficulty in walking, not elsewhere classified  Localized edema  Rationale for Evaluation and Treatment: Rehabilitation  ONSET DATE: 09/01/22  SUBJECTIVE:   SUBJECTIVE  STATEMENT: Whole body is better no "Fiber flair" but he L knee is worst pt reports increase L knee pain since Saturday. Scheduled an appointment to see the PA Wednesday. Sharp stabbing pain when walking  PERTINENT HISTORY: See abo e PAIN:  Are you having pain? Yes: NPRS scale: 2/10 Pain location: across the L knee Pain description: sore Aggravating factors: weight bearing , bending pain up to 6/10 Relieving factors: ice, rest, Tylenol, pain can get down to a 1/10  PRECAUTIONS: None  WEIGHT BEARING RESTRICTIONS: No  FALLS:  Has patient fallen in last 6 months? Yes. Number of falls 1  LIVING ENVIRONMENT: Lives with: lives with their family Lives in: House/apartment Stairs: Yes: Internal: 14 steps; can reach both Has following equipment at home: Walker - 2 wheeled  OCCUPATION: sitting mostly, some walking, lifting up to 30#  PLOF: Independent and walking dog 45 minutes a day, some small hikes  PATIENT GOALS: walk dog, hike, have no pain  NEXT MD VISIT: March 15  OBJECTIVE:   DIAGNOSTIC FINDINGS: see above  PATIENT SURVEYS:  FOTO 20.5  COGNITION: Overall cognitive status: Within functional limits for tasks assessed     SENSATION: WFL  EDEMA:  Circumferential: right 40cm mid patella, left 41 cm  PALPATION: Non tender, mild warmth and edema  LOWER EXTREMITY ROM:  Active ROM Right eval Left eval L  11/03/22  Hip flexion  Hip extension     Hip abduction     Hip adduction     Hip internal rotation     Hip external rotation     Knee flexion  110 120  Knee extension  0 0  Ankle dorsiflexion     Ankle plantarflexion     Ankle inversion     Ankle eversion      (Blank rows = not tested)  LOWER EXTREMITY MMT:  MMT Right eval Left eval  Hip flexion    Hip extension    Hip abduction    Hip adduction    Hip internal rotation    Hip external rotation    Knee flexion  4-  Knee extension  4-  Ankle dorsiflexion    Ankle plantarflexion    Ankle  inversion    Ankle eversion     (Blank rows = not tested) FUNCTIONAL TESTS:  Timed up and go (TUG): 18 seconds  GAIT: Distance walked: 50 feet Assistive device utilized: Walker - 2 wheeled Level of assistance: CGA Comments: step to, very poor weight bearing, touching the toe down, but not the heel, unsure and scared to walk, unable to do stairs   TODAY'S TREATMENT:                                                                                                                              DATE:  11/17/22 Bike L2 x 7 min LAQ 3lb 2x10 Hamstring curls green 2x10 S2S 2x10 LLE HS, ITB, & thomas stretch   11/10/22 NuStep L4 x 6 min HS curls 25lb x 10  pain reported medial L knee Leg Ext 5 lb 2x10 S2S on airex 3x5 Alt cone taps from airex 2x10 Side steps and tandem walking on balance beam in //   11/03/22 Bike L2.5 x 6 min Resisted gait 40lb all directions x3 Attempted 6 in step ups with and without resistance but stopped due to pain 4 in step ups 2x10 Leg press 30lb 2x10 Alt cone taps from airex 2x10 HS curls 25lb x 10  pain reported medial L knee Leg Ext 5 lb 2x10 Hamstring strech  10/27/22 NuStep L 4 x 5 min  Gait outside back building, feels a tight band across the front of the knee, worst during down hill 4in lateral step ups LLEx10 6in step ups LLE 2x10 30lb resisted gait 4 way x3  Heel raises 2x10 LLE LAQ 2lb 3x10 LLE HS curls green 2x12  10/20/22 Bike L2 x 6 min  LLE PROM flex and Ext LLE LAQ 2lb 2x10 LLE hamstring curls red 2x10 20lb resisted gait 4 way x3  4 inch in step ups x10 each 4 in lateral step ups x10 each Heel raises black bar 2x10 Calf stretch S2S 2x10    10/13/22 Bike L 2 x 6 min  Lle PROM flex and Ext Supine LLE SLR x10, 2lb x10 Supine LLE SAQ x10, 2lb 2x10  Henreitta Leber  2x10 S2S 2x5 elevated mat, some anterior L knee pain  Standing march w/ and w/o RX x 10 each  Gait SPC 43ft x2  Gait 75lb  then 150 ft being cautious not pivot on LLE 4 in  step ups with rail x5 then x10  Heel raises 2x10  PATIENT EDUCATION:  Education details: Tax inspector Person educated: Patient Education method: Explanation, Demonstration, Tactile cues, Verbal cues, and Handouts Education comprehension: verbalized understanding, returned demonstration, verbal cues required, and tactile cues required  HOME EXERCISE PROGRAM: Access Code: [email or web address] URL: [email or web address] Date: 10/08/2022 Prepared by: Stacie Glaze  Exercises - Supine Active Straight Leg Raise  - 2 x daily - 7 x weekly - 2 sets - 10 reps - 3 hold - Sidelying Hip Abduction  - 2 x daily - 7 x weekly - 2 sets - 10 reps - 3 hold - Sidelying Hip Adduction  - 2 x daily - 7 x weekly - 2 sets - 10 reps - 3 hold - Prone Hip Extension  - 2 x daily - 7 x weekly - 2 sets - 10 reps - 3 hold  ASSESSMENT:  CLINICAL IMPRESSION: Patient is a 42 y.o. female who was seen today for physical therapy treatment for left medial meniscus repair. Feeling better compared to last session, but doe report more knee pain. She repots a sharp stabbing pain when walking. This sensation is repeated with standing L hip and knee flexion. Thomas stretch did improve symptoms slightly. Pt reports that she has an appointment Wednesday.TUG goal met    OBJECTIVE IMPAIRMENTS: Abnormal gait, cardiopulmonary status limiting activity, decreased activity tolerance, decreased balance, decreased endurance, decreased mobility, difficulty walking, decreased ROM, decreased strength, increased edema, impaired flexibility, and pain.  REHAB POTENTIAL: Good  CLINICAL DECISION MAKING: Stable/uncomplicated  EVALUATION COMPLEXITY: Low   GOALS: Goals reviewed with patient? Yes  SHORT TERM GOALS: Target date: 10/19/22 Independent with initial HEP Goal status: Met 10/27/22 LONG TERM GOALS: Target date: 01/06/23  Independent and safe with advanced HEP Goal status: INITIAL  2.  Resume walking 20  minutes a day Goal status: Progressing 10/23/22  3.  Increase left knee AROM to WNL's Goal status: Progressing  4.  Decrease TUG time to 10 seconds Goal status: Met  8.51s 11/17/22  5.  Walk without device and do stairs step over step Goal status: Partly met 10/27/22  6.  No pain with return to work Goal status: Partly Met 10/27/22   PLAN:  PT FREQUENCY: 1-2x/week  PT DURATION: 12 weeks  PLANNED INTERVENTIONS: Therapeutic exercises, Therapeutic activity, Neuromuscular re-education, Balance training, Gait training, Patient/Family education, Self Care, Joint mobilization, Stair training, Vasopneumatic device, and Manual therapy  PLAN FOR NEXT SESSION: start bike, closed chain activities, gait WBAT and progress to no device   Grayce Sessions, PTA 11/17/2022, 10:15 AM

## 2022-11-17 NOTE — Telephone Encounter (Signed)
Patient called asked if she can be worked into Dr. August Saucer or Leane Call schedule because she believe she injured her knee again. Patient said her knee is buckling and having sharp pain and swelling in the knee. The number to contact patient is [phone]

## 2022-11-18 ENCOUNTER — Ambulatory Visit: Payer: 59 | Admitting: Internal Medicine

## 2022-11-18 NOTE — Telephone Encounter (Signed)
Appt scheduled

## 2022-11-19 ENCOUNTER — Other Ambulatory Visit (INDEPENDENT_AMBULATORY_CARE_PROVIDER_SITE_OTHER): Payer: 59

## 2022-11-19 ENCOUNTER — Ambulatory Visit (INDEPENDENT_AMBULATORY_CARE_PROVIDER_SITE_OTHER): Payer: 59 | Admitting: Orthopedic Surgery

## 2022-11-19 ENCOUNTER — Encounter: Payer: Self-pay | Admitting: Internal Medicine

## 2022-11-19 ENCOUNTER — Encounter: Payer: Self-pay | Admitting: Orthopedic Surgery

## 2022-11-19 DIAGNOSIS — Z9889 Other specified postprocedural states: Secondary | ICD-10-CM | POA: Diagnosis not present

## 2022-11-19 NOTE — Progress Notes (Unsigned)
Subjective:    Patient ID: Lisa Duran, female    DOB: [birth year], 42 y.o.   MRN: [id number]     HPI Lisa Duran is here for follow up of her chronic medical problems.  Friday night hurt knee - Saturday night - Pain in left medial heel when she went to bed- woke up with swelling, pain, hard - she rested, elevated and could not bend it /walk on it.  The next day it was done.   Medications and allergies reviewed with patient and updated if appropriate.  Current Outpatient Medications on File Prior to Visit  Medication Sig Dispense Refill   ALPRAZolam (XANAX) 0.5 MG tablet TAKE 1 TABLET(0.5 MG) BY MOUTH THREE TIMES DAILY AS NEEDED FOR ANXIETY 60 tablet 5   aspirin 81 MG chewable tablet CHEW AND SWALLOW 1 TABLET(81 MG) BY MOUTH TWICE DAILY FOR BLOOD CLOT PREVENTION 180 tablet 0   celecoxib (CELEBREX) 100 MG capsule TAKE 1 CAPSULE(100 MG) BY MOUTH TWICE DAILY 60 capsule 0   Continuous Blood Gluc Receiver (DEXCOM G6 RECEIVER) DEVI by Does not apply route.     Continuous Blood Gluc Sensor (FREESTYLE LIBRE 2 SENSOR) MISC APPLY EVERY 14 DAYS 2 each 2   Continuous Blood Gluc Transmit (DEXCOM G6 TRANSMITTER) MISC by Does not apply route.     DULoxetine (CYMBALTA) 60 MG capsule Take 1 capsule (60 mg total) by mouth daily. 90 capsule 2   EPINEPHrine 0.3 mg/0.3 mL IJ SOAJ injection Use as needed for hypersensitivity reaction 1 each 0   Fexofenadine HCl (ALLEGRA ALLERGY PO) Take by mouth.     glucose blood (ONETOUCH VERIO) test strip Use 2x a day 200 each 3   insulin regular (HUMULIN R) 100 units/mL injection INJECT8-10 UNITS INTO THE SKIN THREE TIMES DAILY BEFORE MEALS. DISCARD OPEN VIAL AFTER 28 DAYS.     Insulin Syringe-Needle U-100 (B-D INS SYR ULTRAFINE .5CC/30G) 30G X 1/2" 0.5 ML MISC Use to inject insulin once daily. 100 each 11   levonorgestrel (MIRENA) 20 MCG/24HR IUD 1 Intra Uterine Device (1 each total) by Intrauterine route once. 1 each 0   metFORMIN (GLUCOPHAGE) 1000 MG tablet TAKE 2  TABLETS BY MOUTH DAILY WITH SUPPER. TAKE 1 TABLET BY MOUTH TWICE A DAY WITH A MEAL 180 tablet 3   methocarbamol (ROBAXIN) 500 MG tablet Take 1 tablet (500 mg total) by mouth every 8 (eight) hours as needed for muscle spasms. 30 tablet 1   omeprazole (PRILOSEC) 40 MG capsule TAKE 1 CAPULE BY MOUTH BEFORE SUPPER DAILY 90 capsule 2   ondansetron (ZOFRAN ODT) 4 MG disintegrating tablet Take 1 tablet (4 mg total) by mouth every 8 (eight) hours as needed for nausea or vomiting. 20 tablet 0   oxyCODONE (ROXICODONE) 5 MG immediate release tablet Take 1 tablet (5 mg total) by mouth every 4 (four) hours as needed for severe pain. 30 tablet 0   promethazine (PHENERGAN) 25 MG tablet Take 1 tablet (25 mg total) by mouth every 6 (six) hours as needed for nausea. 15 tablet 0   rizatriptan (MAXALT) 10 MG tablet Take 1 tablet (10 mg total) by mouth as needed for migraine. May repeat in 2 hours if needed 10 tablet 8   rosuvastatin (CRESTOR) 5 MG tablet TAKE 1 TABLET(5 MG TOTAL) BY MOUTH 2(TWO) TIMES A WEEK 13 tablet 3   Semaglutide,0.25 or 0.5MG /DOS, (OZEMPIC, 0.25 OR 0.5 MG/DOSE,) 2 MG/3ML SOPN INJECT 0.5MG  UNDER THE SKIN ONCE A WEEK 9 mL 3  SYRINGE-NEEDLE, DISP, 3 ML 25G X 1" 3 ML MISC Use for monthly B12 injections 12 each 0   triamcinolone cream (KENALOG) 0.1 % Apply 1 application topically 2 (two) times daily as needed (for itching). 28.4 g 1   Insulin NPH, Human,, Isophane, (HUMULIN N KWIKPEN) 100 UNIT/ML Kiwkpen Inject 10-12 Units into the skin 2 (two) times daily. (Patient not taking: Reported on 11/20/2022) 30 mL 3   Insulin Pen Needle 32G X 4 MM MISC Use as instructed to administer injections 3X daily (Patient not taking: Reported on 11/20/2022) 200 each 1   No current facility-administered medications on file prior to visit.     Review of Systems  Constitutional:  Negative for fever.  HENT:  Positive for congestion and sneezing.   Respiratory:  Positive for cough (at night and morning - mucus in chest)  and wheezing (with mucus in chest). Negative for shortness of breath.   Cardiovascular:  Positive for palpitations (occ - ? anxiety related). Negative for chest pain and leg swelling.  Neurological:  Positive for light-headedness (occ). Negative for headaches.       Objective:   Vitals:   11/20/22 0835  BP: 108/72  Pulse: 80  Temp: 98.6 F (37 C)  SpO2: 98%   BP Readings from Last 3 Encounters:  11/20/22 108/72  10/31/22 120/84  06/27/22 108/80   Wt Readings from Last 3 Encounters:  11/20/22 184 lb (83.5 kg)  10/31/22 186 lb 6.4 oz (84.6 kg)  06/27/22 173 lb 6.4 oz (78.7 kg)   Body mass index is 31.58 kg/m.    Physical Exam Constitutional:      General: She is not in acute distress.    Appearance: Normal appearance.  HENT:     Head: Normocephalic and atraumatic.  Eyes:     Conjunctiva/sclera: Conjunctivae normal.  Cardiovascular:     Rate and Rhythm: Normal rate and regular rhythm.     Heart sounds: Normal heart sounds.  Pulmonary:     Effort: Pulmonary effort is normal. No respiratory distress.     Breath sounds: Normal breath sounds. No wheezing.  Musculoskeletal:     Cervical back: Neck supple.     Right lower leg: No edema.     Left lower leg: No edema.  Lymphadenopathy:     Cervical: No cervical adenopathy.  Skin:    General: Skin is warm and dry.     Findings: No rash.  Neurological:     Mental Status: She is alert. Mental status is at baseline.  Psychiatric:        Mood and Affect: Mood normal.        Behavior: Behavior normal.        Lab Results  Component Value Date   WBC 10.6 (H) 05/20/2022   HGB 13.5 05/20/2022   HCT 41.9 05/20/2022   PLT 332.0 05/20/2022   GLUCOSE 84 10/31/2022   CHOL 141 05/20/2022   TRIG 73.0 05/20/2022   HDL 64.20 05/20/2022   LDLCALC 62 05/20/2022   ALT 10 05/20/2022   AST 12 05/20/2022   NA 136 05/20/2022   K 4.2 05/20/2022   CL 102 05/20/2022   CREATININE 0.69 05/20/2022   BUN 6 05/20/2022   CO2 23  05/20/2022   TSH 2.21 05/20/2022   INR 1.1 01/11/2019   HGBA1C 6.5 (A) 10/31/2022   MICROALBUR 1.2 05/20/2022     Assessment & Plan:    See Problem List for Assessment and Plan of chronic medical problems.

## 2022-11-19 NOTE — Patient Instructions (Addendum)
      Blood work was ordered.   The lab is on the first floor.    Medications changes include :   Adderall 20 mg twice a day      Return in about 6 months (around 05/22/2023) for Physical Exam.

## 2022-11-19 NOTE — Progress Notes (Signed)
Post-Op Visit Note   Patient: Lisa Duran           Date of Birth: [birth year]           MRN: [id number] Visit Date: 11/19/2022 PCP: Pincus Sanes, MD   Assessment & Plan:  Chief Complaint:  Chief Complaint  Patient presents with   Left Knee - Pain   Visit Diagnoses:  1. S/P medial meniscal repair     Plan: [name] is a patient with left knee pain.  She had left knee scope with meniscal root repair on 08/25/2022.  On Friday she was trying to catch a small dog who was running fast in the street.  This occurred on 11/14/2022.  Noticed pain after that going up and down the neighbor stairs.  She did kneel on the look under a car.  Since then it has been painful.  Stepping back she reports sharp pain.  Localizes the pain medially.  She is not having much pain in the back but more of the pain is in the front.  On examination there is no effusion.  Range of motion is full.  Does have some anteromedial joint line tenderness.  Collateral crucial ligaments are stable.  Do not detect any crepitus or grinding or mechanical symptoms with passive or active range of motion of the knee.  Impression is possible injury to the meniscal root repair.  Fortunately there is not much effusion.  But she is having a lot more sharp shooting pain that she was having in the past.  Need MRI scan of that left knee to evaluate for recurrent tear.  Follow-up after that study  Follow-Up Instructions: No follow-ups on file.   Orders:  Orders Placed This Encounter  Procedures   MR Knee Left w/o contrast   XR KNEE 3 VIEW LEFT   No orders of the defined types were placed in this encounter.   Imaging: XR KNEE 3 VIEW LEFT  Result Date: 11/19/2022 AP lateral merchant radiographs left knee reviewed.  No acute fracture.  Joint space maintained.  Minimal degenerative changes.   PMFS History: Patient Active Problem List   Diagnosis Date Noted   B12 deficiency 05/09/2021   Dyslipidemia 04/14/2019   Enteritis  01/12/2019   Sleep walking disorder 04/06/2017   Iron deficiency anemia 03/27/2017   Fibromyalgia 02/03/2017   GERD (gastroesophageal reflux disease) 06/24/2015   Eczema 06/22/2015   Depression 06/22/2015   Tachycardia 06/16/2014   Food allergy    ADHD (attention deficit hyperactivity disorder) 01/27/2011   Anxiety 02/20/2010   ALLERGIC RHINITIS 11/21/2009   OVARIAN CYST 11/21/2009   Diabetes mellitus type 2 with neurological manifestations 11/20/2009   Class 2 obesity in adult 11/20/2009   Migraine without aura 11/20/2009   Past Medical History:  Diagnosis Date   ADHD (attention deficit hyperactivity disorder)    ALLERGIC RHINITIS    Allergy    Anemia    ANXIETY    Depression    DIABETES MELLITUS, TYPE II    Fibromyalgia 2018   GERD (gastroesophageal reflux disease)    MIGRAINE, COMMON    Obesity, unspecified    OVARIAN CYST     Family History  Problem Relation Age of Onset   Diabetes Mother    Hyperlipidemia Mother    CAD Mother    CVA Mother    Diabetes Father    Hyperlipidemia Father    Coronary artery disease Father    Hypertension Father    Cancer Father  esoph   Stroke Father    Cancer Maternal Grandfather 47       Prostate and Lung   Diabetes Sister        Gestational   Graves' disease Maternal Aunt    Cancer Maternal Uncle        Brain   Stroke Paternal Grandmother    Heart disease Paternal Grandmother    Stroke Paternal Grandfather    Heart disease Paternal Grandfather    Diabetes Sister        Gestational and Type II   Hashimoto's thyroiditis Cousin    Cancer Maternal Uncle        Brain    Past Surgical History:  Procedure Laterality Date   NO PAST SURGERIES     Social History   Occupational History   Not on file  Tobacco Use   Smoking status: Former    Types: Cigarettes    Quit date: 08/20/2002    Years since quitting: 20.2   Smokeless tobacco: Never   Tobacco comments:    single, separated from spouse 02/2010.   Vaping Use    Vaping Use: Never used  Substance and Sexual Activity   Alcohol use: Yes    Comment: couple of drinks a week   Drug use: No   Sexual activity: Not Currently    Birth control/protection: I.U.D.

## 2022-11-20 ENCOUNTER — Ambulatory Visit
Admission: RE | Admit: 2022-11-20 | Discharge: 2022-11-20 | Disposition: A | Discharge status: Home / Self Care | Payer: 59 | Source: Ambulatory Visit | Attending: Orthopedic Surgery | Admitting: Orthopedic Surgery

## 2022-11-20 ENCOUNTER — Ambulatory Visit (INDEPENDENT_AMBULATORY_CARE_PROVIDER_SITE_OTHER): Payer: 59 | Admitting: Internal Medicine

## 2022-11-20 VITALS — BP 108/72 | HR 80 | Temp 98.6°F | Ht 64.0 in | Wt 184.0 lb

## 2022-11-20 DIAGNOSIS — F3289 Other specified depressive episodes: Secondary | ICD-10-CM | POA: Diagnosis not present

## 2022-11-20 DIAGNOSIS — E785 Hyperlipidemia, unspecified: Secondary | ICD-10-CM

## 2022-11-20 DIAGNOSIS — R000 Tachycardia, unspecified: Secondary | ICD-10-CM

## 2022-11-20 DIAGNOSIS — F419 Anxiety disorder, unspecified: Secondary | ICD-10-CM

## 2022-11-20 DIAGNOSIS — Z9889 Other specified postprocedural states: Secondary | ICD-10-CM

## 2022-11-20 DIAGNOSIS — E538 Deficiency of other specified B group vitamins: Secondary | ICD-10-CM | POA: Diagnosis not present

## 2022-11-20 DIAGNOSIS — D508 Other iron deficiency anemias: Secondary | ICD-10-CM | POA: Diagnosis not present

## 2022-11-20 DIAGNOSIS — M797 Fibromyalgia: Secondary | ICD-10-CM

## 2022-11-20 DIAGNOSIS — F900 Attention-deficit hyperactivity disorder, predominantly inattentive type: Secondary | ICD-10-CM

## 2022-11-20 DIAGNOSIS — G43009 Migraine without aura, not intractable, without status migrainosus: Secondary | ICD-10-CM

## 2022-11-20 DIAGNOSIS — E1149 Type 2 diabetes mellitus with other diabetic neurological complication: Secondary | ICD-10-CM | POA: Diagnosis not present

## 2022-11-20 DIAGNOSIS — K219 Gastro-esophageal reflux disease without esophagitis: Secondary | ICD-10-CM

## 2022-11-20 LAB — CBC WITH DIFFERENTIAL/PLATELET
Basophils Absolute: 0.1 10*3/uL (ref 0.0–0.1)
Basophils Relative: 0.8 % (ref 0.0–3.0)
Eosinophils Absolute: 1.4 10*3/uL — ABNORMAL HIGH (ref 0.0–0.7)
Eosinophils Relative: 15.8 % — ABNORMAL HIGH (ref 0.0–5.0)
HCT: 41.4 % (ref 36.0–46.0)
Hemoglobin: 13.4 g/dL (ref 12.0–15.0)
Lymphocytes Relative: 23.4 % (ref 12.0–46.0)
Lymphs Abs: 2 10*3/uL (ref 0.7–4.0)
MCHC: 32.4 g/dL (ref 30.0–36.0)
MCV: 80.9 fl (ref 78.0–100.0)
Monocytes Absolute: 0.7 10*3/uL (ref 0.1–1.0)
Monocytes Relative: 7.7 % (ref 3.0–12.0)
Neutro Abs: 4.5 10*3/uL (ref 1.4–7.7)
Neutrophils Relative %: 52.3 % (ref 43.0–77.0)
Platelets: 332 10*3/uL (ref 150.0–400.0)
RBC: 5.11 Mil/uL (ref 3.87–5.11)
RDW: 16.4 % — ABNORMAL HIGH (ref 11.5–15.5)
WBC: 8.7 10*3/uL (ref 4.0–10.5)

## 2022-11-20 LAB — COMPREHENSIVE METABOLIC PANEL
ALT: 9 U/L (ref 0–35)
AST: 13 U/L (ref 0–37)
Albumin: 4.2 g/dL (ref 3.5–5.2)
Alkaline Phosphatase: 125 U/L — ABNORMAL HIGH (ref 39–117)
BUN: 9 mg/dL (ref 6–23)
CO2: 26 mEq/L (ref 19–32)
Calcium: 9.6 mg/dL (ref 8.4–10.5)
Chloride: 102 mEq/L (ref 96–112)
Creatinine, Ser: 0.68 mg/dL (ref 0.40–1.20)
GFR: 106.14 mL/min (ref >60.00)
Glucose, Bld: 117 mg/dL — ABNORMAL HIGH (ref 70–99)
Potassium: 4.7 mEq/L (ref 3.5–5.1)
Sodium: 136 mEq/L (ref 135–145)
Total Bilirubin: 0.3 mg/dL (ref 0.2–1.2)
Total Protein: 7.5 g/dL (ref 6.0–8.3)

## 2022-11-20 LAB — LIPID PANEL
Cholesterol: 198 mg/dL (ref 0–200)
HDL: 59.7 mg/dL (ref >39.00)
LDL Cholesterol: 121 mg/dL — ABNORMAL HIGH (ref 0–99)
NonHDL: 138.67
Total CHOL/HDL Ratio: 3
Triglycerides: 87 mg/dL (ref 0.0–149.0)
VLDL: 17.4 mg/dL (ref 0.0–40.0)

## 2022-11-20 LAB — IBC PANEL
Iron: 41 ug/dL — ABNORMAL LOW (ref 42–145)
Saturation Ratios: 10.2 % — ABNORMAL LOW (ref 20.0–50.0)
TIBC: 403.2 ug/dL (ref 250.0–450.0)
Transferrin: 288 mg/dL (ref 212.0–360.0)

## 2022-11-20 LAB — FERRITIN: Ferritin: 16.3 ng/mL (ref 10.0–291.0)

## 2022-11-20 LAB — VITAMIN B12: Vitamin B-12: 892 pg/mL (ref 211–911)

## 2022-11-20 MED ORDER — METOPROLOL SUCCINATE ER 25 MG PO TB24: ORAL_TABLET | ORAL | 1 refills | Status: DC

## 2022-11-20 MED ORDER — CYANOCOBALAMIN 1000 MCG/ML IJ SOLN: 1000.0000 ug | INTRAMUSCULAR | 1 refills | Status: DC

## 2022-11-20 MED ORDER — AMPHETAMINE-DEXTROAMPHETAMINE 20 MG PO TABS: 20.0000 mg | ORAL_TABLET | Freq: Two times a day (BID) | ORAL | 0 refills | Status: DC

## 2022-11-20 NOTE — Assessment & Plan Note (Signed)
Chronic Controlled, Stable Continue metoprolol XL 25 mg daily 

## 2022-11-20 NOTE — Assessment & Plan Note (Addendum)
Chronic Controlled Continue as Maxalt 10 mg daily as needed or over-the-counter Excedrin Migraine Also on metoprolol

## 2022-11-20 NOTE — Assessment & Plan Note (Signed)
Chronic Controlled, Stable Continue Xanax 0.5 mg 3 times daily as needed, duloxetine 60 mg daily 

## 2022-11-20 NOTE — Assessment & Plan Note (Addendum)
Chronic GERD controlled, occ flare Continue omeprazole 40 mg every other day to every other 2 days

## 2022-11-20 NOTE — Assessment & Plan Note (Addendum)
Chronic Controlled, Stable Has not been able to try Focalin 20 mg twice daily Resume adderall 20 mg bid

## 2022-11-20 NOTE — Assessment & Plan Note (Signed)
Chronic Regular exercise and healthy diet encouraged Check lipid panel  Continue Crestor 5 mg 2 days a week 

## 2022-11-20 NOTE — Assessment & Plan Note (Addendum)
Chronic Management per Dr. Elvera Lennox   Lab Results  Component Value Date   HGBA1C 6.5 (A) 10/31/2022   Sugars controlled Stressed regular exercise, diabetic diet

## 2022-11-20 NOTE — Assessment & Plan Note (Signed)
Chronic ?Controlled, Stable ?Continue duloxetine 60 mg daily ?

## 2022-11-20 NOTE — Assessment & Plan Note (Signed)
Chronic Doing B12 injections monthly at home Check B12 level 

## 2022-11-20 NOTE — Assessment & Plan Note (Signed)
Chronic Does not tolerate oral iron Check iron panel, CBC 

## 2022-11-20 NOTE — Assessment & Plan Note (Signed)
Chronic Controlled Continue Cymbalta 60 mg daily Stressed regular exercise, stress reduction 

## 2022-11-24 ENCOUNTER — Ambulatory Visit: Payer: 59 | Admitting: Physical Therapy

## 2022-11-26 ENCOUNTER — Ambulatory Visit: Payer: 59 | Admitting: Surgical

## 2022-12-01 ENCOUNTER — Encounter: Payer: Self-pay | Admitting: Physical Therapy

## 2022-12-01 ENCOUNTER — Ambulatory Visit: Payer: 59 | Admitting: Physical Therapy

## 2022-12-01 DIAGNOSIS — R262 Difficulty in walking, not elsewhere classified: Secondary | ICD-10-CM

## 2022-12-01 DIAGNOSIS — M25562 Pain in left knee: Secondary | ICD-10-CM | POA: Diagnosis not present

## 2022-12-01 DIAGNOSIS — M25662 Stiffness of left knee, not elsewhere classified: Secondary | ICD-10-CM

## 2022-12-01 DIAGNOSIS — R600 Localized edema: Secondary | ICD-10-CM

## 2022-12-01 NOTE — Therapy (Signed)
OUTPATIENT PHYSICAL THERAPY LOWER EXTREMITY TREATMENT   Patient Name: Lisa Duran MRN: [id number] DOB:[birth year], 42 y.o., female Today's Date: 12/01/2022  END OF SESSION:  PT End of Session - 12/01/22 1014     Visit Number 8    Date for PT Re-Evaluation 01/06/23    PT Start Time 1015    PT Stop Time 1100    PT Time Calculation (min) 45 min    Activity Tolerance Patient tolerated treatment well    Behavior During Therapy WFL for tasks assessed/performed             Past Medical History:  Diagnosis Date   ADHD (attention deficit hyperactivity disorder)    ALLERGIC RHINITIS    Allergy    Anemia    ANXIETY    Depression    DIABETES MELLITUS, TYPE II    Fibromyalgia 2018   GERD (gastroesophageal reflux disease)    MIGRAINE, COMMON    Obesity, unspecified    OVARIAN CYST    Past Surgical History:  Procedure Laterality Date   NO PAST SURGERIES     Patient Active Problem List   Diagnosis Date Noted   B12 deficiency 05/09/2021   Dyslipidemia 04/14/2019   Enteritis 01/12/2019   Sleep walking disorder 04/06/2017   Iron deficiency anemia 03/27/2017   Fibromyalgia 02/03/2017   GERD (gastroesophageal reflux disease) 06/24/2015   Eczema 06/22/2015   Depression 06/22/2015   Tachycardia 06/16/2014   Food allergy    ADHD (attention deficit hyperactivity disorder) 01/27/2011   Anxiety 02/20/2010   ALLERGIC RHINITIS 11/21/2009   OVARIAN CYST 11/21/2009   Diabetes mellitus type 2 with neurological manifestations 11/20/2009   Class 2 obesity in adult 11/20/2009   Migraine without aura 11/20/2009    PCP: Cheryll Cockayne, MD  REFERRING PROVIDER: Keitha Butte, MD  REFERRING DIAG: s/p left meniscus repair  THERAPY DIAG:  Acute pain of left knee  Stiffness of left knee, not elsewhere classified  Difficulty in walking, not elsewhere classified  Localized edema  Rationale for Evaluation and Treatment: Rehabilitation  ONSET DATE: 09/01/22  SUBJECTIVE:   SUBJECTIVE  STATEMENT: Feeling good, not catching as often as it was. Been taking it easier   PERTINENT HISTORY: See abo e PAIN:  Are you having pain? Yes: NPRS scale: 0/10 Pain location: across the L knee Pain description: sore Aggravating factors: weight bearing , bending pain up to 6/10 Relieving factors: ice, rest, Tylenol, pain can get down to a 1/10  PRECAUTIONS: None  WEIGHT BEARING RESTRICTIONS: No  FALLS:  Has patient fallen in last 6 months? Yes. Number of falls 1  LIVING ENVIRONMENT: Lives with: lives with their family Lives in: House/apartment Stairs: Yes: Internal: 14 steps; can reach both Has following equipment at home: Walker - 2 wheeled  OCCUPATION: sitting mostly, some walking, lifting up to 30#  PLOF: Independent and walking dog 45 minutes a day, some small hikes  PATIENT GOALS: walk dog, hike, have no pain  NEXT MD VISIT: March 15  OBJECTIVE:   DIAGNOSTIC FINDINGS: see above  PATIENT SURVEYS:  FOTO 20.5  COGNITION: Overall cognitive status: Within functional limits for tasks assessed     SENSATION: WFL  EDEMA:  Circumferential: right 40cm mid patella, left 41 cm  PALPATION: Non tender, mild warmth and edema  LOWER EXTREMITY ROM:  Active ROM Right eval Left eval L  11/03/22 L 12/01/22  Hip flexion      Hip extension      Hip abduction  Hip adduction      Hip internal rotation      Hip external rotation      Knee flexion  110 120 120  Knee extension  0 0 0  Ankle dorsiflexion      Ankle plantarflexion      Ankle inversion      Ankle eversion       (Blank rows = not tested)  LOWER EXTREMITY MMT:  MMT Right eval Left eval  Hip flexion    Hip extension    Hip abduction    Hip adduction    Hip internal rotation    Hip external rotation    Knee flexion  4-  Knee extension  4-  Ankle dorsiflexion    Ankle plantarflexion    Ankle inversion    Ankle eversion     (Blank rows = not tested) FUNCTIONAL TESTS:  Timed up and go  (TUG): 18 seconds  GAIT: Distance walked: 50 feet Assistive device utilized: Environmental consultant - 2 wheeled Level of assistance: CGA Comments: step to, very poor weight bearing, touching the toe down, but not the heel, unsure and scared to walk, unable to do stairs   TODAY'S TREATMENT:                                                                                                                              DATE:  12/01/22 Bike L2 x 6 min Checked ROM and goals S2S on airex 2x10 Step ups 6in LLE 2x10 Hamstring curls 2x10 Leg Ext 10lb 2x10 LAQ LLE 5lb 2x12 Hamstring curls LLE green 2x15 Ball squeeze 2x10  11/17/22 Bike L2 x 7 min LAQ 3lb 2x10 Hamstring curls green 2x10 S2S 2x10 LLE HS, ITB, & thomas stretch   11/10/22 NuStep L4 x 6 min HS curls 25lb x 10  pain reported medial L knee Leg Ext 5 lb 2x10 S2S on airex 3x5 Alt cone taps from airex 2x10 Side steps and tandem walking on balance beam in //   11/03/22 Bike L2.5 x 6 min Resisted gait 40lb all directions x3 Attempted 6 in step ups with and without resistance but stopped due to pain 4 in step ups 2x10 Leg press 30lb 2x10 Alt cone taps from airex 2x10 HS curls 25lb x 10  pain reported medial L knee Leg Ext 5 lb 2x10 Hamstring strech   PATIENT EDUCATION:  Education details: Tax inspector Person educated: Patient Education method: Explanation, Demonstration, Actor cues, Verbal cues, and Handouts Education comprehension: verbalized understanding, returned demonstration, verbal cues required, and tactile cues required  HOME EXERCISE PROGRAM: Access Code: [email or web address] URL: [email or web address] Date: 10/08/2022 Prepared by: Stacie Glaze  Exercises - Supine Active Straight Leg Raise  - 2 x daily - 7 x weekly - 2 sets - 10 reps - 3 hold - Sidelying Hip Abduction  - 2 x daily - 7 x weekly - 2 sets - 10 reps - 3 hold - Sidelying  Hip Adduction  - 2 x daily - 7 x weekly - 2 sets - 10 reps  - 3 hold - Prone Hip Extension  - 2 x daily - 7 x weekly - 2 sets - 10 reps - 3 hold  ASSESSMENT:  CLINICAL IMPRESSION: Patient is a 42 y.o. female who was seen today for physical therapy treatment for left medial meniscus repair. Feeling better compared to last session, receive a MRI recently and has a MD appointment Friday. Pt has full L knee AROM. No issue completing today's interventions. Some LE shaking with sit to stands and LAQ. Cue for eccentric control of step downs.    OBJECTIVE IMPAIRMENTS: Abnormal gait, cardiopulmonary status limiting activity, decreased activity tolerance, decreased balance, decreased endurance, decreased mobility, difficulty walking, decreased ROM, decreased strength, increased edema, impaired flexibility, and pain.  REHAB POTENTIAL: Good  CLINICAL DECISION MAKING: Stable/uncomplicated  EVALUATION COMPLEXITY: Low   GOALS: Goals reviewed with patient? Yes  SHORT TERM GOALS: Target date: 10/19/22 Independent with initial HEP Goal status: Met 10/27/22  LONG TERM GOALS: Target date: 01/06/23  Independent and safe with advanced HEP Goal status: INITIAL  2.  Resume walking 20 minutes a day Goal status: Progressing 10/23/22  3.  Increase left knee AROM to WNL's Goal status: Met 12/01/2022  4.  Decrease TUG time to 10 seconds Goal status: Met  8.51s 11/17/22  5.  Walk without device and do stairs step over step Goal status: Partly met 10/27/22  6.  No pain with return to work Goal status: Met 4 /22/24   PLAN:  PT FREQUENCY: 1-2x/week  PT DURATION: 12 weeks  PLANNED INTERVENTIONS: Therapeutic exercises, Therapeutic activity, Neuromuscular re-education, Balance training, Gait training, Patient/Family education, Self Care, Joint mobilization, Stair training, Vasopneumatic device, and Manual therapy  PLAN FOR NEXT SESSION: start bike, closed chain activities, gait WBAT and progress to no device   Grayce Sessions, PTA 12/01/2022, 10:14 AM

## 2022-12-02 ENCOUNTER — Telehealth: Payer: Self-pay | Admitting: Orthopedic Surgery

## 2022-12-02 NOTE — Telephone Encounter (Signed)
[employer] forms received. Patient seen 4/8. Please advise work status and provide note. Forms to [employer].

## 2022-12-05 ENCOUNTER — Telehealth: Payer: Self-pay

## 2022-12-05 ENCOUNTER — Ambulatory Visit (INDEPENDENT_AMBULATORY_CARE_PROVIDER_SITE_OTHER): Payer: 59 | Admitting: Surgical

## 2022-12-05 DIAGNOSIS — Z9889 Other specified postprocedural states: Secondary | ICD-10-CM | POA: Diagnosis not present

## 2022-12-05 DIAGNOSIS — M25562 Pain in left knee: Secondary | ICD-10-CM

## 2022-12-05 MED ORDER — MELOXICAM 15 MG PO TABS: 15.0000 mg | ORAL_TABLET | Freq: Every day | ORAL | 0 refills | Status: DC

## 2022-12-05 NOTE — Telephone Encounter (Signed)
VOB submitted for Durolane, left knee.  

## 2022-12-05 NOTE — Telephone Encounter (Signed)
Left knee gel injection -Lisa Duran

## 2022-12-07 ENCOUNTER — Encounter: Payer: Self-pay | Admitting: Orthopedic Surgery

## 2022-12-07 MED ORDER — BUPIVACAINE HCL 0.25 % IJ SOLN
4.0000 mL | INTRAMUSCULAR | Status: AC | PRN
Start: 2022-12-05 — End: 2022-12-05
  Administered 2022-12-05: 4 mL via INTRA_ARTICULAR

## 2022-12-07 MED ORDER — LIDOCAINE HCL 1 % IJ SOLN
5.0000 mL | INTRAMUSCULAR | Status: AC | PRN
Start: 2022-12-05 — End: 2022-12-05
  Administered 2022-12-05: 5 mL

## 2022-12-07 NOTE — Progress Notes (Signed)
Office Visit Note   Patient: Lisa Duran           Date of Birth: 1980-09-19           MRN: [id number] Visit Date: 12/05/2022 Requested by: Pincus Sanes, MD [address] [place],  [place] [address] PCP: Pincus Sanes, MD  Subjective: Chief Complaint  Patient presents with   Left Knee - Follow-up    HPI: Lisa Duran is a 42 y.o. female who presents to the office for MRI review. Patient denies any changes in symptoms.  Continues to complain mainly of medial knee pain that is worse with ambulation.  Mostly localizes pain to the posterior medial joint line with some pain in the lateral joint line as well that is new for her.  She feels she has occasional new popping sensation since her reinjury.  Causes an ache at night but does not wake her from sleep at night.  She feels she has increased pain compared with when she saw Dr. August Saucer prior to MRI scan.  MRI results revealed: MR Knee Left w/o contrast  Result Date: 11/20/2022 CLINICAL DATA:  Medial left knee pain. Prior surgery for meniscal tear 08/2022. Trying to catch dog from running off 1 week ago and got pain in left knee. EXAM: MRI OF THE LEFT KNEE WITHOUT CONTRAST TECHNIQUE: Multiplanar, multisequence MR imaging of the knee was performed. No intravenous contrast was administered. COMPARISON:  Left knee radiographs 11/19/2022 and 01/08/2022; MRI left knee 04/05/2022. FINDINGS: MENISCI Medial meniscus: There is absence of approximately 60% of the central aspect of the meniscal triangle of the root of the posterior horn of the medial meniscus (sagittal series 10 images 06/12/2012 and coronal series 9, image 15). There is oblique linear mildly decreased T1 and centrally decreased T2 and peripherally increased T2 signal of a postsurgical channel within the proximal medial tibia from an anterior inferior to posterior superior orientation extending to the region of the prior meniscal tear/attenuated meniscus. There is also minimal increased T2  signal measuring only 1-2 mm in craniocaudal height at the peripheral inferior aspect of the mid AP dimension of the body of the medial meniscus (coronal series 9, images 18 and 19), a tiny undersurface tear. Previously there was similar signal in this region and this is mass of the changed from 04/05/2022. There is mild diffuse intermediate proton density signal throughout the junction of the body and posterior horn of the medial meniscus, unchanged from prior. Lateral meniscus:  Intact. LIGAMENTS Cruciates: The ACL and PCL are intact. Collaterals: The medial collateral ligament is intact. The fibular collateral ligament, biceps femoris tendon, iliotibial band, and popliteus tendon are intact. CARTILAGE Patellofemoral: There is again high-grade patellofemoral cartilage degenerative change, with full-thickness cartilage loss and regions of the patellar apex and adjacent medial patellar facet, and diffusely throughout the medial trochlea. Mild-to-moderate superomedial trochlear degenerative spurring is unchanged. Medial: There is high-grade partial to full-thickness cartilage loss throughout the majority of the weight-bearing medial femoral condyle, similar to prior. This appears slightly worsened within the slightly lateral aspect of the weight-bearing medial femoral condyle where there is new moderate subchondral marrow edema (coronal series 7, image 17). There is also slightly increased mild subchondral marrow edema within the medial aspect of the medial tibial plateau where there is focal high-grade partial to full-thickness cartilage loss that is mildly worsened from prior. Lateral: There is again mild-to-moderate subchondral cystic change within the slightly anteromedial aspect of the lateral tibial plateau. Mild thinning of the  weight-bearing lateral femoral condyle cartilage. Joint: Tiny joint effusion. Normal Hoffa's fat pad. No plical thickening. Popliteal Fossa:  No Baker's cyst. Extensor Mechanism:   Intact quadriceps tendon and patellar tendon. Bones:  No acute fracture or dislocation. Other: None. IMPRESSION: 1. There is absence of approximately 60% of the central aspect of the meniscal triangle of the root of the posterior horn of the medial meniscus where previously there was a complex radial tear. There also appears to be postsurgical change of a transtibial tunnel suture repair of this region of the medial meniscus. Given there is slightly less meniscal tissue present in the region of this prior tear compared to prior preoperative 04/05/2022 MRI, recommend clinical correlation with the prior operative report as to whether a partial meniscectomy was performed in this region. 2. Moderate to severe medial and patellofemoral compartment cartilage degenerative changes. This appears slightly worsened within the slightly lateral aspect of the weight-bearing medial femoral condyle and medial aspect of the medial tibial plateau where there is new subchondral marrow edema. 3. Tiny joint effusion. Electronically Signed   By: Neita Garnet M.D.   On: 11/20/2022 14:43                 ROS: All systems reviewed are negative as they relate to the chief complaint within the history of present illness.  Patient denies fevers or chills.  Assessment & Plan: Visit Diagnoses:  1. S/P medial meniscal repair     Plan: Lisa Duran is a 42 y.o. female who presents to the office for review of left knee MRI scan.  She is s/p left knee meniscal root repair on 08/25/2022.  Overall she feels her symptoms are somewhat worse compared with her last examination.  She is only taking Tylenol for pain control.  We reviewed the MRI scan imaging today as well as correlating this with her arthroscopy imaging from the time of meniscal root repair.  It is evident on the sagittal view of the MRI scan that there is still appears to be some meniscal tissue that aligns with the tibial tunnel.  There is no definitive re-tear of the meniscal  root though this is really difficult to completely rule out without arthroscopy imaging.  She would like to avoid any repeat surgery and avoid knee replacement for as long as she can.  After discussion of options, she would like to try Toradol injection with preapproval for gel injection.  She does already have some early arthritis in the medial compartment that was evident from arthroscopy imaging.  Will try her on combination of Tylenol and meloxicam instead of just taking Tylenol.  Follow-up for gel injection in 3 to 4 weeks.  We will also keep her current work restrictions with no lifting more than about 5 pounds.  Follow-Up Instructions: No follow-ups on file.   Orders:  No orders of the defined types were placed in this encounter.  Meds ordered this encounter  Medications   meloxicam (MOBIC) 15 MG tablet    Sig: Take 1 tablet (15 mg total) by mouth daily.    Dispense:  30 tablet    Refill:  0      Procedures: Large Joint Inj: L knee on 12/05/2022 11:46 AM Indications: diagnostic evaluation, joint swelling and pain Details: 18 G 1.5 in needle, superolateral approach  Arthrogram: No  Medications: 5 mL lidocaine 1 %; 4 mL bupivacaine 0.25 % (4 cc of bupivacaine mixed with 1 cc of Toradol) Aspirate: 3 mL Outcome: tolerated well,  no immediate complications Procedure, treatment alternatives, risks and benefits explained, specific risks discussed. Consent was given by the patient. Immediately prior to procedure a time out was called to verify the correct patient, procedure, equipment, support staff and site/side marked as required. Patient was prepped and draped in the usual sterile fashion.       Clinical Data: No additional findings.  Objective: Vital Signs: There were no vitals taken for this visit.  Physical Exam:  Constitutional: Patient appears well-developed HEENT:  Head: Normocephalic Eyes:EOM are normal Neck: Normal range of motion Cardiovascular: Normal  rate Pulmonary/chest: Effort normal Neurologic: Patient is alert Skin: Skin is warm Psychiatric: Patient has normal mood and affect  Ortho Exam: Ortho exam demonstrates left knee with trace effusion.  No significant tenderness over the lateral joint line.  She does have moderate tenderness over the medial and posterior medial joint line.  She has good range of motion from 0 degrees extension to 120 degrees of knee flexion.  Able to perform straight leg raise without extensor lag.  Excellent quad strength rated 5/5.  Incisions are well-healed from prior surgery.  No calf tenderness.  Negative Homans' sign.  She has increased pain with terminal flexion of the knee.  Specialty Comments:  No specialty comments available.  Imaging: No results found.   PMFS History: Patient Active Problem List   Diagnosis Date Noted   B12 deficiency 05/09/2021   Dyslipidemia 04/14/2019   Enteritis 01/12/2019   Sleep walking disorder 04/06/2017   Iron deficiency anemia 03/27/2017   Fibromyalgia 02/03/2017   GERD (gastroesophageal reflux disease) 06/24/2015   Eczema 06/22/2015   Depression 06/22/2015   Tachycardia 06/16/2014   Food allergy    ADHD (attention deficit hyperactivity disorder) 01/27/2011   Anxiety 02/20/2010   ALLERGIC RHINITIS 11/21/2009   OVARIAN CYST 11/21/2009   Diabetes mellitus type 2 with neurological manifestations (HCC) 11/20/2009   Class 2 obesity in adult 11/20/2009   Migraine without aura 11/20/2009   Past Medical History:  Diagnosis Date   ADHD (attention deficit hyperactivity disorder)    ALLERGIC RHINITIS    Allergy    Anemia    ANXIETY    Depression    DIABETES MELLITUS, TYPE II    Fibromyalgia 2018   GERD (gastroesophageal reflux disease)    MIGRAINE, COMMON    Obesity, unspecified    OVARIAN CYST     Family History  Problem Relation Age of Onset   Diabetes Mother    Hyperlipidemia Mother    CAD Mother    CVA Mother    Diabetes Father    Hyperlipidemia  Father    Coronary artery disease Father    Hypertension Father    Cancer Father        esoph   Stroke Father    Cancer Maternal Grandfather 78       Prostate and Lung   Diabetes Sister        Gestational   Graves' disease Maternal Aunt    Cancer Maternal Uncle        Brain   Stroke Paternal Grandmother    Heart disease Paternal Grandmother    Stroke Paternal Grandfather    Heart disease Paternal Grandfather    Diabetes Sister        Gestational and Type II   Hashimoto's thyroiditis Cousin    Cancer Maternal Uncle        Brain    Past Surgical History:  Procedure Laterality Date   NO PAST SURGERIES  Social History   Occupational History   Not on file  Tobacco Use   Smoking status: Former    Types: Cigarettes    Quit date: 08/20/2002    Years since quitting: 20.3   Smokeless tobacco: Never   Tobacco comments:    single, separated from spouse 02/2010.   Vaping Use   Vaping Use: Never used  Substance and Sexual Activity   Alcohol use: Yes    Comment: couple of drinks a week   Drug use: No   Sexual activity: Not Currently    Birth control/protection: I.U.D.

## 2022-12-08 ENCOUNTER — Other Ambulatory Visit: Payer: Self-pay | Admitting: Surgical

## 2022-12-08 ENCOUNTER — Ambulatory Visit: Payer: 59 | Admitting: Physical Therapy

## 2022-12-10 ENCOUNTER — Ambulatory Visit: Payer: 59 | Admitting: Orthopedic Surgery

## 2022-12-17 NOTE — Telephone Encounter (Signed)
Noted for [employer]. 

## 2022-12-17 NOTE — Telephone Encounter (Signed)
Okay to be back at work as of 12/05/2022 with no lifting more than 5 pounds per Luke's note.

## 2022-12-25 ENCOUNTER — Encounter: Payer: Self-pay | Admitting: Internal Medicine

## 2022-12-25 NOTE — Progress Notes (Signed)
Subjective:    Patient ID: Lisa Duran, female    DOB: May 05, 1981, 42 y.o.   MRN: [id number]      HPI Lisa Duran is here for  Chief Complaint  Patient presents with   Tick Removal    Tick bite noticed Sunday/Monday. Area is swollen and hard and would like to have it looked at.     Tick bite - mons pubis - 5 nights ago.  Also a smaller bite on her right thigh.  Her pubic area is indurated, red and swollen.  It itches - she is taking benadryl.  It is sore.  She has been cleaning it with an antibacterial soap.    It was [place] tick. She did remove it.  No fever  Medications and allergies reviewed with patient and updated if appropriate.  Current Outpatient Medications on File Prior to Visit  Medication Sig Dispense Refill   ALPRAZolam (XANAX) 0.5 MG tablet TAKE 1 TABLET(0.5 MG) BY MOUTH THREE TIMES DAILY AS NEEDED FOR ANXIETY 60 tablet 5   amphetamine-dextroamphetamine (ADDERALL) 20 MG tablet Take 1 tablet (20 mg total) by mouth 2 (two) times daily. 180 tablet 0   aspirin 81 MG chewable tablet CHEW AND SWALLOW 1 TABLET(81 MG) BY MOUTH TWICE DAILY FOR BLOOD CLOT PREVENTION 180 tablet 0   celecoxib (CELEBREX) 100 MG capsule TAKE 1 CAPSULE(100 MG) BY MOUTH TWICE DAILY 60 capsule 0   Continuous Blood Gluc Receiver (DEXCOM G6 RECEIVER) DEVI by Does not apply route.     Continuous Blood Gluc Sensor (FREESTYLE LIBRE 2 SENSOR) MISC APPLY EVERY 14 DAYS 2 each 2   Continuous Blood Gluc Transmit (DEXCOM G6 TRANSMITTER) MISC by Does not apply route.     cyanocobalamin (VITAMIN B12) 1000 MCG/ML injection Inject 1 mL (1,000 mcg total) into the muscle every 30 (thirty) days. 10 mL 1   DULoxetine (CYMBALTA) 60 MG capsule Take 1 capsule (60 mg total) by mouth daily. 90 capsule 2   EPINEPHrine 0.3 mg/0.3 mL IJ SOAJ injection Use as needed for hypersensitivity reaction 1 each 0   Fexofenadine HCl (ALLEGRA ALLERGY PO) Take by mouth.     glucose blood (ONETOUCH VERIO) test strip Use 2x a day 200 each 3    insulin regular (HUMULIN R) 100 units/mL injection INJECT8-10 UNITS INTO THE SKIN THREE TIMES DAILY BEFORE MEALS. DISCARD OPEN VIAL AFTER 28 DAYS.     Insulin Syringe-Needle U-100 (B-D INS SYR ULTRAFINE .5CC/30G) 30G X 1/2" 0.5 ML MISC Use to inject insulin once daily. 100 each 11   levonorgestrel (MIRENA) 20 MCG/24HR IUD 1 Intra Uterine Device (1 each total) by Intrauterine route once. 1 each 0   meloxicam (MOBIC) 15 MG tablet Take 1 tablet (15 mg total) by mouth daily. 30 tablet 0   metFORMIN (GLUCOPHAGE) 1000 MG tablet TAKE 2 TABLETS BY MOUTH DAILY WITH SUPPER. TAKE 1 TABLET BY MOUTH TWICE A DAY WITH A MEAL 180 tablet 3   methocarbamol (ROBAXIN) 500 MG tablet Take 1 tablet (500 mg total) by mouth every 8 (eight) hours as needed for muscle spasms. 30 tablet 1   metoprolol succinate (TOPROL-XL) 25 MG 24 hr tablet TAKE 1 TABLET(25 MG) BY MOUTH DAILY 90 tablet 1   omeprazole (PRILOSEC) 40 MG capsule TAKE 1 CAPULE BY MOUTH BEFORE SUPPER DAILY 90 capsule 2   ondansetron (ZOFRAN ODT) 4 MG disintegrating tablet Take 1 tablet (4 mg total) by mouth every 8 (eight) hours as needed for nausea or vomiting. 20 tablet 0  oxyCODONE (ROXICODONE) 5 MG immediate release tablet Take 1 tablet (5 mg total) by mouth every 4 (four) hours as needed for severe pain. 30 tablet 0   promethazine (PHENERGAN) 25 MG tablet Take 1 tablet (25 mg total) by mouth every 6 (six) hours as needed for nausea. 15 tablet 0   rizatriptan (MAXALT) 10 MG tablet Take 1 tablet (10 mg total) by mouth as needed for migraine. May repeat in 2 hours if needed 10 tablet 8   rosuvastatin (CRESTOR) 5 MG tablet TAKE 1 TABLET(5 MG TOTAL) BY MOUTH 2(TWO) TIMES A WEEK 13 tablet 3   Semaglutide,0.25 or 0.5MG /DOS, (OZEMPIC, 0.25 OR 0.5 MG/DOSE,) 2 MG/3ML SOPN INJECT 0.5MG  UNDER THE SKIN ONCE A WEEK 9 mL 3   SYRINGE-NEEDLE, DISP, 3 ML 25G X 1" 3 ML MISC Use for monthly B12 injections 12 each 0   triamcinolone cream (KENALOG) 0.1 % Apply 1 application  topically 2 (two) times daily as needed (for itching). 28.4 g 1   No current facility-administered medications on file prior to visit.    Review of Systems     Objective:   Vitals:   12/26/22 0800  BP: 106/80  Pulse: (!) 101  Temp: 98.6 F (37 C)  SpO2: 99%   BP Readings from Last 3 Encounters:  12/26/22 106/80  11/20/22 108/72  10/31/22 120/84   Wt Readings from Last 3 Encounters:  12/26/22 181 lb (82.1 kg)  11/20/22 184 lb (83.5 kg)  10/31/22 186 lb 6.4 oz (84.6 kg)   Body mass index is 31.07 kg/m.    Physical Exam Constitutional:      General: She is not in acute distress.    Appearance: Normal appearance. She is not ill-appearing.  HENT:     Head: Normocephalic and atraumatic.  Skin:    General: Skin is warm and dry.     Comments: 2 Very small Scabbed areas on right medial proximal thigh with minimal erythema surrounding - possible insect bite.  Mon pubis with 2 inch diameter area of induration, tenderness and erythema with surrounding erythema.  No open wound or fluctuance  Neurological:     Mental Status: She is alert.            Assessment & Plan:    See Problem List for Assessment and Plan of chronic medical problems.

## 2022-12-25 NOTE — Patient Instructions (Addendum)
       Medications changes include :   doxycycline 100 mg twice daily x 10 days, diflucan      Return if symptoms worsen or fail to improve.

## 2022-12-26 ENCOUNTER — Ambulatory Visit (INDEPENDENT_AMBULATORY_CARE_PROVIDER_SITE_OTHER): Payer: 59 | Admitting: Internal Medicine

## 2022-12-26 VITALS — BP 106/80 | HR 101 | Temp 98.6°F | Ht 64.0 in | Wt 181.0 lb

## 2022-12-26 DIAGNOSIS — S30860A Insect bite (nonvenomous) of lower back and pelvis, initial encounter: Secondary | ICD-10-CM | POA: Diagnosis not present

## 2022-12-26 DIAGNOSIS — L03311 Cellulitis of abdominal wall: Secondary | ICD-10-CM | POA: Insufficient documentation

## 2022-12-26 DIAGNOSIS — W57XXXA Bitten or stung by nonvenomous insect and other nonvenomous arthropods, initial encounter: Secondary | ICD-10-CM | POA: Diagnosis not present

## 2022-12-26 MED ORDER — FLUCONAZOLE 150 MG PO TABS: 150.0000 mg | ORAL_TABLET | Freq: Once | ORAL | 0 refills | Status: AC

## 2022-12-26 MED ORDER — DOXYCYCLINE HYCLATE 100 MG PO TABS: 100.0000 mg | ORAL_TABLET | Freq: Two times a day (BID) | ORAL | 0 refills | Status: AC

## 2022-12-26 NOTE — Assessment & Plan Note (Signed)
Acute Secondary to insect bite-tick bite She did remove the tick and it was a [place] tick Area is indurated, erythematous, tender and itching Start doxycycline 100 mg twice daily x 7 days She will monitor closely and let me know if symptoms or not improving in the next 48 hours and if did not resolve completely

## 2022-12-26 NOTE — Assessment & Plan Note (Signed)
Acute She noticed a tick on herself 5 nights ago and did remove it-it was a [place] tick She does have evidence of cellulitis and will treat with doxycycline 100 mg twice daily x 10 days Discussed alpha-gal allergy which she is aware of.  No increased risk of Lyme disease Tick prevention discussed

## 2022-12-26 NOTE — Assessment & Plan Note (Signed)
Acute Insect-tick bite mons pubis 5 nights ago with evidence of cellulitis She did remove the tick herself will treat infection

## 2023-01-19 ENCOUNTER — Other Ambulatory Visit: Payer: Self-pay

## 2023-01-19 DIAGNOSIS — Z9889 Other specified postprocedural states: Secondary | ICD-10-CM

## 2023-01-29 ENCOUNTER — Ambulatory Visit: Payer: 59 | Admitting: Orthopedic Surgery

## 2023-01-29 ENCOUNTER — Ambulatory Visit (INDEPENDENT_AMBULATORY_CARE_PROVIDER_SITE_OTHER): Payer: 59 | Admitting: Sports Medicine

## 2023-01-29 VITALS — BP 108/78 | HR 97 | Ht 64.0 in | Wt 178.0 lb

## 2023-01-29 DIAGNOSIS — M25551 Pain in right hip: Secondary | ICD-10-CM | POA: Diagnosis not present

## 2023-01-29 MED ORDER — MELOXICAM 15 MG PO TABS
15.0000 mg | ORAL_TABLET | Freq: Every day | ORAL | 0 refills | Status: DC
Start: 2023-01-29 — End: 2023-07-16

## 2023-01-29 NOTE — Patient Instructions (Signed)
-   Start meloxicam 15 mg daily x2 weeks.  If still having pain after 2 weeks, complete 3rd-week of meloxicam. May use remaining meloxicam as needed once daily for pain control.  Do not to use additional NSAIDs while taking meloxicam.  May use Tylenol [phone] mg 2 to 3 times a day for breakthrough pain. Hip HEP  2 week follow up

## 2023-01-29 NOTE — Progress Notes (Signed)
Aleen Sells [name] [address] [place] [address] Phone: [phone]   Assessment and Plan:     1. Right hip pain -Acute, uncomplicated, initial sports medicine visit - Most consistent with intra-articular hip pain without red flag symptoms on physical exam.  Likely flared with lifting and moving activities at work - Start meloxicam 15 mg daily x2 weeks.  If still having pain after 2 weeks, complete 3rd-week of meloxicam. May use remaining meloxicam as needed once daily for pain control.  Do not to use additional NSAIDs while taking meloxicam.  May use Tylenol [phone] mg 2 to 3 times a day for breakthrough pain. - Start HEP for hip - Patient's wedding is in August, so we will attempt to have patient's treatment plan optimized before that time  Other orders - meloxicam (MOBIC) 15 MG tablet; Take 1 tablet (15 mg total) by mouth daily.    Pertinent previous records reviewed include none   Follow Up: 2 weeks for reevaluation.  If no improvement or worsening symptoms, would obtain right hip x-ray and could consider intra-articular hip CSI versus PT   Subjective:   I, [name], am serving as a Neurosurgeon for Doctor Richardean Sale  Chief Complaint: right hip pain   HPI:   01/29/23 Patient is a 42 year old female complaining of right hip pain. Patient states that she has been in pain for about a month , has pain when she goes up stares. Yesterday she had a really bad flare the pain radiates to her glute. She isn't able to sleep and or lift her leg when she is laying. The pain is sharpe and shooting down the front of her shin. The pain has made her lower back tighten up. Antalgic gait, if she twist and turns she has a sharp pain, pain when sitting , ADLS are painful all around. Ice , heat , meloxicam short term , ibu aren't touching the pain. Does get tingling but no numbness . Isnt able to workout on the stationary bike because it is  painful . She is getting married August 3rd    Relevant Historical Information: DM type II  Additional pertinent review of systems negative.   Current Outpatient Medications:    ALPRAZolam (XANAX) 0.5 MG tablet, TAKE 1 TABLET(0.5 MG) BY MOUTH THREE TIMES DAILY AS NEEDED FOR ANXIETY, Disp: 60 tablet, Rfl: 5   amphetamine-dextroamphetamine (ADDERALL) 20 MG tablet, Take 1 tablet (20 mg total) by mouth 2 (two) times daily., Disp: 180 tablet, Rfl: 0   aspirin 81 MG chewable tablet, CHEW AND SWALLOW 1 TABLET(81 MG) BY MOUTH TWICE DAILY FOR BLOOD CLOT PREVENTION, Disp: 180 tablet, Rfl: 0   celecoxib (CELEBREX) 100 MG capsule, TAKE 1 CAPSULE(100 MG) BY MOUTH TWICE DAILY, Disp: 60 capsule, Rfl: 0   Continuous Blood Gluc Receiver (DEXCOM G6 RECEIVER) DEVI, by Does not apply route., Disp: , Rfl:    Continuous Blood Gluc Sensor (FREESTYLE LIBRE 2 SENSOR) MISC, APPLY EVERY 14 DAYS, Disp: 2 each, Rfl: 2   Continuous Blood Gluc Transmit (DEXCOM G6 TRANSMITTER) MISC, by Does not apply route., Disp: , Rfl:    cyanocobalamin (VITAMIN B12) 1000 MCG/ML injection, Inject 1 mL (1,000 mcg total) into the muscle every 30 (thirty) days., Disp: 10 mL, Rfl: 1   DULoxetine (CYMBALTA) 60 MG capsule, Take 1 capsule (60 mg total) by mouth daily., Disp: 90 capsule, Rfl: 2   EPINEPHrine 0.3 mg/0.3 mL IJ SOAJ injection, Use as needed  for hypersensitivity reaction, Disp: 1 each, Rfl: 0   Fexofenadine HCl (ALLEGRA ALLERGY PO), Take by mouth., Disp: , Rfl:    glucose blood (ONETOUCH VERIO) test strip, Use 2x a day, Disp: 200 each, Rfl: 3   insulin regular (HUMULIN R) 100 units/mL injection, INJECT8-10 UNITS INTO THE SKIN THREE TIMES DAILY BEFORE MEALS. DISCARD OPEN VIAL AFTER 28 DAYS., Disp: , Rfl:    Insulin Syringe-Needle U-100 (B-D INS SYR ULTRAFINE .5CC/30G) 30G X 1/2" 0.5 ML MISC, Use to inject insulin once daily., Disp: 100 each, Rfl: 11   levonorgestrel (MIRENA) 20 MCG/24HR IUD, 1 Intra Uterine Device (1 each total) by  Intrauterine route once., Disp: 1 each, Rfl: 0   meloxicam (MOBIC) 15 MG tablet, Take 1 tablet (15 mg total) by mouth daily., Disp: 30 tablet, Rfl: 0   meloxicam (MOBIC) 15 MG tablet, Take 1 tablet (15 mg total) by mouth daily., Disp: 30 tablet, Rfl: 0   metFORMIN (GLUCOPHAGE) 1000 MG tablet, TAKE 2 TABLETS BY MOUTH DAILY WITH SUPPER. TAKE 1 TABLET BY MOUTH TWICE A DAY WITH A MEAL, Disp: 180 tablet, Rfl: 3   methocarbamol (ROBAXIN) 500 MG tablet, Take 1 tablet (500 mg total) by mouth every 8 (eight) hours as needed for muscle spasms., Disp: 30 tablet, Rfl: 1   metoprolol succinate (TOPROL-XL) 25 MG 24 hr tablet, TAKE 1 TABLET(25 MG) BY MOUTH DAILY, Disp: 90 tablet, Rfl: 1   omeprazole (PRILOSEC) 40 MG capsule, TAKE 1 CAPULE BY MOUTH BEFORE SUPPER DAILY, Disp: 90 capsule, Rfl: 2   ondansetron (ZOFRAN ODT) 4 MG disintegrating tablet, Take 1 tablet (4 mg total) by mouth every 8 (eight) hours as needed for nausea or vomiting., Disp: 20 tablet, Rfl: 0   oxyCODONE (ROXICODONE) 5 MG immediate release tablet, Take 1 tablet (5 mg total) by mouth every 4 (four) hours as needed for severe pain., Disp: 30 tablet, Rfl: 0   promethazine (PHENERGAN) 25 MG tablet, Take 1 tablet (25 mg total) by mouth every 6 (six) hours as needed for nausea., Disp: 15 tablet, Rfl: 0   rizatriptan (MAXALT) 10 MG tablet, Take 1 tablet (10 mg total) by mouth as needed for migraine. May repeat in 2 hours if needed, Disp: 10 tablet, Rfl: 8   rosuvastatin (CRESTOR) 5 MG tablet, TAKE 1 TABLET(5 MG TOTAL) BY MOUTH 2(TWO) TIMES A WEEK, Disp: 13 tablet, Rfl: 3   Semaglutide,0.25 or 0.5MG /DOS, (OZEMPIC, 0.25 OR 0.5 MG/DOSE,) 2 MG/3ML SOPN, INJECT 0.5MG  UNDER THE SKIN ONCE A WEEK, Disp: 9 mL, Rfl: 3   SYRINGE-NEEDLE, DISP, 3 ML 25G X 1" 3 ML MISC, Use for monthly B12 injections, Disp: 12 each, Rfl: 0   triamcinolone cream (KENALOG) 0.1 %, Apply 1 application topically 2 (two) times daily as needed (for itching)., Disp: 28.4 g, Rfl: 1    Objective:     Vitals:   01/29/23 0839  BP: 108/78  Pulse: 97  SpO2: 99%  Weight: 178 lb (80.7 kg)  Height: 5\' 4"  (1.626 m)      Body mass index is 30.55 kg/m.    Physical Exam:    General: awake, alert, and oriented no acute distress, nontoxic Skin: no suspicious lesions or rashes Neuro:sensation intact distally with no deficits, normal muscle tone, no atrophy, strength 5/5 in all tested lower ext groups Psych: normal mood and affect, speech clear   Right hip: No deformity, swelling or wasting ROM Flexion 80 with pain, ext 30, IR 30 with pain, ER 40 with pain TTP hip  flexors, greater trochanter NTTP over the   gluteal musculature, si joint, lumbar spine Negative log roll with FROM Positive FABER for C-shaped pain Positive FADIR for C-shaped pain Positive piriformis test for groin pain without radicular symptoms Positive right trendelenberg Gait antalgic, favoring left leg   Electronically signed by:  Aleen Sells [name] 9:09 AM 01/29/23

## 2023-02-03 NOTE — Progress Notes (Signed)
Lisa Duran [name] [address] [place] [address] Phone: [phone]   Assessment and Plan:     1. Right hip pain -Subacute, improving, subsequent visit - Overall improvement, though patient continues to have daily pain.  Most consistent with intra-articular hip pathology based on physical exam.  Suspect flare occurred due to lifting and moving activities at work - Discontinue meloxicam and may use remainder as needed - Recommend Tylenol for day-to-day pain relief - Continue HEP for hip - Patient elected for intra-articular hip CSI.  Tolerated well per note below - Our goal is to get patient feeling significantly better before her wedding in August   Procedure: Ultrasound Guided Hip Acetabulofemoral Joint Injection Side: Right Diagnosis: Right hip pain [place] Indication:  - accuracy is paramount for diagnosis - to ensure therapeutic efficacy or procedural success - to reduce procedural risk  After explaining the procedure, viable alternatives, risks, and answering any questions, consent was given verbally. The site was cleaned with chlorhexidine prep. An ultrasound transducer was placed on the anterior thigh/hip.   The acetabular joint, labrum, and femoral shaft were identified.  The neurovascular structures were identified and an approach was found specifically avoiding these structures.  A steroid injection was performed under ultrasound guidance with sterile technique using 2ml of 1% lidocaine without epinephrine and 40 mg of triamcinolone (KENALOG) 40mg /ml. This was well tolerated and resulted in  relief.  Needle was removed and dressing placed and post injection instructions were given including  a discussion of likely return of pain today after the anesthetic wears off (with the possibility of worsened pain) until the steroid starts to work in 1-3 days.   Pt was advised to call or return to clinic if these symptoms worsen or fail to  improve as anticipated.   Pertinent previous records reviewed include none   Follow Up: 3 weeks for reevaluation.  If no improvement or worsening of symptoms, could consider repeat meloxicam versus prednisone course   Subjective:   I, Lisa Duran, am serving as a Neurosurgeon for Doctor Richardean Sale   Chief Complaint: right hip pain    HPI:    01/29/23 Patient is a 42 year old female complaining of right hip pain. Patient states that she has been in pain for about a month , has pain when she goes up stares. Yesterday she had a really bad flare the pain radiates to her glute. She isn't able to sleep and or lift her leg when she is laying. The pain is sharpe and shooting down the front of her shin. The pain has made her lower back tighten up. Antalgic gait, if she twist and turns she has a sharp pain, pain when sitting , ADLS are painful all around. Ice , heat , meloxicam short term , ibu aren't touching the pain. Does get tingling but no numbness . Isnt able to workout on the stationary bike because it is painful . She is getting married August 3rd    02/11/2023 Patient states she is better not all the way resolved but much improved. Pain with sleep and sitting and lifting to chest    Relevant Historical Information: DM type II  Additional pertinent review of systems negative.   Current Outpatient Medications:    ALPRAZolam (XANAX) 0.5 MG tablet, TAKE 1 TABLET(0.5 MG) BY MOUTH THREE TIMES DAILY AS NEEDED FOR ANXIETY, Disp: 60 tablet, Rfl: 5   amphetamine-dextroamphetamine (ADDERALL) 20 MG tablet, Take 1 tablet (20 mg  total) by mouth 2 (two) times daily., Disp: 180 tablet, Rfl: 0   aspirin 81 MG chewable tablet, CHEW AND SWALLOW 1 TABLET(81 MG) BY MOUTH TWICE DAILY FOR BLOOD CLOT PREVENTION, Disp: 180 tablet, Rfl: 0   celecoxib (CELEBREX) 100 MG capsule, TAKE 1 CAPSULE(100 MG) BY MOUTH TWICE DAILY, Disp: 60 capsule, Rfl: 0   Continuous Blood Gluc Receiver (DEXCOM G6 RECEIVER) DEVI, by  Does not apply route., Disp: , Rfl:    Continuous Blood Gluc Sensor (FREESTYLE LIBRE 2 SENSOR) MISC, APPLY EVERY 14 DAYS, Disp: 2 each, Rfl: 2   Continuous Blood Gluc Transmit (DEXCOM G6 TRANSMITTER) MISC, by Does not apply route., Disp: , Rfl:    cyanocobalamin (VITAMIN B12) 1000 MCG/ML injection, Inject 1 mL (1,000 mcg total) into the muscle every 30 (thirty) days., Disp: 10 mL, Rfl: 1   DULoxetine (CYMBALTA) 60 MG capsule, Take 1 capsule (60 mg total) by mouth daily., Disp: 90 capsule, Rfl: 2   EPINEPHrine 0.3 mg/0.3 mL IJ SOAJ injection, Use as needed for hypersensitivity reaction, Disp: 1 each, Rfl: 0   Fexofenadine HCl (ALLEGRA ALLERGY PO), Take by mouth., Disp: , Rfl:    glucose blood (ONETOUCH VERIO) test strip, Use 2x a day, Disp: 200 each, Rfl: 3   insulin regular (HUMULIN R) 100 units/mL injection, INJECT8-10 UNITS INTO THE SKIN THREE TIMES DAILY BEFORE MEALS. DISCARD OPEN VIAL AFTER 28 DAYS., Disp: , Rfl:    Insulin Syringe-Needle U-100 (B-D INS SYR ULTRAFINE .5CC/30G) 30G X 1/2" 0.5 ML MISC, Use to inject insulin once daily., Disp: 100 each, Rfl: 11   levonorgestrel (MIRENA) 20 MCG/24HR IUD, 1 Intra Uterine Device (1 each total) by Intrauterine route once., Disp: 1 each, Rfl: 0   meloxicam (MOBIC) 15 MG tablet, Take 1 tablet (15 mg total) by mouth daily., Disp: 30 tablet, Rfl: 0   meloxicam (MOBIC) 15 MG tablet, Take 1 tablet (15 mg total) by mouth daily., Disp: 30 tablet, Rfl: 0   metFORMIN (GLUCOPHAGE) 1000 MG tablet, TAKE 2 TABLETS BY MOUTH DAILY WITH SUPPER. TAKE 1 TABLET BY MOUTH TWICE A DAY WITH A MEAL, Disp: 180 tablet, Rfl: 3   methocarbamol (ROBAXIN) 500 MG tablet, Take 1 tablet (500 mg total) by mouth every 8 (eight) hours as needed for muscle spasms., Disp: 30 tablet, Rfl: 1   metoprolol succinate (TOPROL-XL) 25 MG 24 hr tablet, TAKE 1 TABLET(25 MG) BY MOUTH DAILY, Disp: 90 tablet, Rfl: 1   omeprazole (PRILOSEC) 40 MG capsule, TAKE 1 CAPULE BY MOUTH BEFORE SUPPER DAILY, Disp:  90 capsule, Rfl: 2   ondansetron (ZOFRAN ODT) 4 MG disintegrating tablet, Take 1 tablet (4 mg total) by mouth every 8 (eight) hours as needed for nausea or vomiting., Disp: 20 tablet, Rfl: 0   oxyCODONE (ROXICODONE) 5 MG immediate release tablet, Take 1 tablet (5 mg total) by mouth every 4 (four) hours as needed for severe pain., Disp: 30 tablet, Rfl: 0   promethazine (PHENERGAN) 25 MG tablet, Take 1 tablet (25 mg total) by mouth every 6 (six) hours as needed for nausea., Disp: 15 tablet, Rfl: 0   rizatriptan (MAXALT) 10 MG tablet, Take 1 tablet (10 mg total) by mouth as needed for migraine. May repeat in 2 hours if needed, Disp: 10 tablet, Rfl: 8   rosuvastatin (CRESTOR) 5 MG tablet, TAKE 1 TABLET(5 MG TOTAL) BY MOUTH 2(TWO) TIMES A WEEK, Disp: 13 tablet, Rfl: 3   Semaglutide,0.25 or 0.5MG /DOS, (OZEMPIC, 0.25 OR 0.5 MG/DOSE,) 2 MG/3ML SOPN, INJECT 0.5MG  UNDER  THE SKIN ONCE A WEEK, Disp: 9 mL, Rfl: 3   SYRINGE-NEEDLE, DISP, 3 ML 25G X 1" 3 ML MISC, Use for monthly B12 injections, Disp: 12 each, Rfl: 0   triamcinolone cream (KENALOG) 0.1 %, Apply 1 application topically 2 (two) times daily as needed (for itching)., Disp: 28.4 g, Rfl: 1   Objective:     Vitals:   02/11/23 1415  BP: 102/80  Pulse: 86  SpO2: 99%  Weight: 178 lb (80.7 kg)  Height: 5\' 4"  (1.626 m)      Body mass index is 30.55 kg/m.    Physical Exam:    General: awake, alert, and oriented no acute distress, nontoxic Skin: no suspicious lesions or rashes Neuro:sensation intact distally with no deficits, normal muscle tone, no atrophy, strength 5/5 in all tested lower ext groups Psych: normal mood and affect, speech clear   Right hip: No deformity, swelling or wasting ROM Flexion 80 with pain, ext 30, IR 30 with pain, ER 40 with pain TTP hip flexors, greater trochanter NTTP over the   gluteal musculature, si joint, lumbar spine Negative log roll with FROM Positive FABER for C-shaped pain Positive FADIR for C-shaped  pain Positive piriformis test for groin pain without radicular symptoms Positive right trendelenberg Gait antalgic, favoring left leg    Electronically signed by:  Lisa Duran [name] 4:47 PM 02/11/23

## 2023-02-09 ENCOUNTER — Ambulatory Visit (INDEPENDENT_AMBULATORY_CARE_PROVIDER_SITE_OTHER): Payer: 59 | Admitting: Orthopedic Surgery

## 2023-02-09 DIAGNOSIS — M1712 Unilateral primary osteoarthritis, left knee: Secondary | ICD-10-CM

## 2023-02-10 ENCOUNTER — Encounter: Payer: Self-pay | Admitting: Orthopedic Surgery

## 2023-02-10 DIAGNOSIS — M1712 Unilateral primary osteoarthritis, left knee: Secondary | ICD-10-CM | POA: Diagnosis not present

## 2023-02-10 MED ORDER — SODIUM HYALURONATE 60 MG/3ML IX PRSY
60.0000 mg | PREFILLED_SYRINGE | INTRA_ARTICULAR | Status: AC | PRN
Start: 2023-02-10 — End: 2023-02-10
  Administered 2023-02-10: 60 mg via INTRA_ARTICULAR

## 2023-02-10 MED ORDER — LIDOCAINE HCL 1 % IJ SOLN
5.0000 mL | INTRAMUSCULAR | Status: AC | PRN
Start: 2023-02-10 — End: 2023-02-10
  Administered 2023-02-10: 5 mL

## 2023-02-10 NOTE — Progress Notes (Signed)
   Procedure Note  Patient: Lisa Duran             Date of Birth: 05-27-1981           MRN: [id number]             Visit Date: 02/09/2023  Procedures: Visit Diagnoses:  1. Arthritis of left knee     Large Joint Inj: L knee on 02/10/2023 9:40 PM Indications: diagnostic evaluation, joint swelling and pain Details: 18 G 1.5 in needle, superolateral approach  Arthrogram: No  Medications: 5 mL lidocaine 1 %; 60 mg Sodium Hyaluronate 60 MG/3ML Outcome: tolerated well, no immediate complications Procedure, treatment alternatives, risks and benefits explained, specific risks discussed. Consent was given by the patient. Immediately prior to procedure a time out was called to verify the correct patient, procedure, equipment, support staff and site/side marked as required. Patient was prepped and draped in the usual sterile fashion.

## 2023-02-11 ENCOUNTER — Ambulatory Visit (INDEPENDENT_AMBULATORY_CARE_PROVIDER_SITE_OTHER): Payer: 59

## 2023-02-11 ENCOUNTER — Other Ambulatory Visit: Payer: Self-pay

## 2023-02-11 ENCOUNTER — Ambulatory Visit (INDEPENDENT_AMBULATORY_CARE_PROVIDER_SITE_OTHER): Payer: 59 | Admitting: Sports Medicine

## 2023-02-11 VITALS — BP 102/80 | HR 86 | Ht 64.0 in | Wt 178.0 lb

## 2023-02-11 DIAGNOSIS — M25551 Pain in right hip: Secondary | ICD-10-CM

## 2023-02-11 NOTE — Patient Instructions (Signed)
Stop meloxicam  Tylenol [phone] mg 2-3 times a day for pain relief  Continue HEP  3 week follow up

## 2023-02-16 ENCOUNTER — Ambulatory Visit: Payer: 59 | Admitting: Orthopedic Surgery

## 2023-02-24 ENCOUNTER — Other Ambulatory Visit: Payer: Self-pay

## 2023-02-24 DIAGNOSIS — E1149 Type 2 diabetes mellitus with other diabetic neurological complication: Secondary | ICD-10-CM

## 2023-02-24 MED ORDER — METFORMIN HCL 1000 MG PO TABS
ORAL_TABLET | ORAL | 0 refills | Status: DC
Start: 2023-02-24 — End: 2023-03-02

## 2023-03-02 ENCOUNTER — Ambulatory Visit (INDEPENDENT_AMBULATORY_CARE_PROVIDER_SITE_OTHER): Payer: 59 | Admitting: Internal Medicine

## 2023-03-02 ENCOUNTER — Encounter: Payer: Self-pay | Admitting: Internal Medicine

## 2023-03-02 VITALS — BP 118/76 | HR 98 | Ht 64.0 in | Wt 176.8 lb

## 2023-03-02 DIAGNOSIS — Z7984 Long term (current) use of oral hypoglycemic drugs: Secondary | ICD-10-CM

## 2023-03-02 DIAGNOSIS — E663 Overweight: Secondary | ICD-10-CM | POA: Diagnosis not present

## 2023-03-02 DIAGNOSIS — Z7985 Long-term (current) use of injectable non-insulin antidiabetic drugs: Secondary | ICD-10-CM

## 2023-03-02 DIAGNOSIS — E785 Hyperlipidemia, unspecified: Secondary | ICD-10-CM | POA: Diagnosis not present

## 2023-03-02 DIAGNOSIS — E1149 Type 2 diabetes mellitus with other diabetic neurological complication: Secondary | ICD-10-CM

## 2023-03-02 LAB — HEMOGLOBIN A1C: Hemoglobin A1C: 6.4

## 2023-03-02 MED ORDER — FREESTYLE LIBRE 3 SENSOR MISC: 1.0000 | 3 refills | Status: DC

## 2023-03-02 MED ORDER — METFORMIN HCL 1000 MG PO TABS: ORAL_TABLET | ORAL | 3 refills | Status: DC

## 2023-03-02 NOTE — Progress Notes (Signed)
=ubjective:     Patient ID: Lisa Duran, female   DOB: 01/04/1981, 42 y.o.   MRN: [id number]  HPI Ms Wilensky is a pleasant 42 y.o.  woman returning for f/u for DM2, insulin dependent, uncontrolled, dx 05/2009, w/o complications. Last visit 4 months ago.  Interim history: No increased urination, blurry vision, nausea, chest pain. After starting Ozempic, she had constipation, gas, nausea, but these improved.  She continues on Pepcid and Mylanta prn. She lost approximately 70 pounds on Ozempic before last visit.  At last visit, however, she gained 13 pounds after having had a torn meniscus and knee surgery in 08/2022.  She had several weeks of inactivity but she was starting to walk again. She is getting married in 12 days.  Reviewed HbA1c levels: Lab Results  Component Value Date   HGBA1C 6.5 (A) 10/31/2022   HGBA1C 6.9 (A) 06/27/2022   HGBA1C 7.0 (A) 02/20/2022   HGBA1C 6.9 (A) 10/08/2021   HGBA1C 7.6 (A) 07/01/2021   HGBA1C 7.5 (A) 02/21/2021   HGBA1C 7.6 (A) 10/16/2020   HGBA1C 8.6 (A) 06/12/2020   HGBA1C 8.5 (A) 11/07/2019   HGBA1C 8.4 (A) 08/09/2019   She is on:   - Metformin 1000 mg 2x a day  - NPH 26-28 units and 22-24 units at bedtime >> 0-12 units 2x a day >> 10 >> 12 >> 6-8 units 2x a day >> off now - Regular  4-6 units before large meals only - rarely  >> off now - Ozempic 0.5 mg weekly  We stopped glipizide ER and also Januvia. We tried Cycloset 0.8 mg daily >> stopped end of 11/2016 b/c GERD/AP/C We tried Trulicity >> could not tolerate it: N/V/AP. She restarted Januvia 09/2016. She was on Invokana 100 mg in am (started 12/2014) >> 2 yeast inf >> tx with Diflucan; she was exhausted and had nocturia >> stopped 03/12/2015 Tried Victoza 2 mo ago >> AP, severe GERD, inj site rxn Tried Januvia >> tolerated it well, but stopped when Invokana was suggested.  She was also on Actos, but taken off b/c good control. She had problems tolerating insulins in the past.  She was  previously on Lantus and NovoLog.  Basaglar and [place] caused eczema and also GI symptoms.  She has a [employer].  She checks her sugars more than 4 times a day with her CGM - not in the last mo:   Prev.:  Previously:   She has hypoglycemia awareness in the 80s.  No CKD: Lab Results  Component Value Date   BUN 9 11/20/2022   Lab Results  Component Value Date   CREATININE 0.68 11/20/2022   No MAU: Lab Results  Component Value Date   MICRALBCREAT 0.6 05/20/2022   MICRALBCREAT 0.5 05/10/2021   MICRALBCREAT 28 05/09/2020   MICRALBCREAT 0.8 10/20/2018   MICRALBCREAT 0.5 02/03/2017   + HL: Lab Results  Component Value Date   CHOL 198 11/20/2022   HDL 59.70 11/20/2022   LDLCALC 121 (H) 11/20/2022   TRIG 87.0 11/20/2022   CHOLHDL 3 11/20/2022  On Crestor 5 mg 2x a week.  - Last eye exam 01/2023: reportedly No DR, IOP high - changed opthalmologists, - prev.[facility] in [facility].  - Resolved numbness and tingling in hand and feet.  She felt much better on Cymbalta and Neurontin, but she is now off both -no return of symptoms.  Last foot exam 10/31/2022.  She had CP >> had a stress test 07/04/2015 >>  Normal.  She is on metoprolol for tachycardia. She has fibromyalgia.   She also has a history of iron deficiency anemia and had iron infusions.   In 2019, she just got out of a 5-year relationship.  She was under a lot of stress.   Review of Systems + see HPI  Past Medical History:  Diagnosis Date   ADHD (attention deficit hyperactivity disorder)    ALLERGIC RHINITIS    Allergy    Anemia    ANXIETY    Depression    DIABETES MELLITUS, TYPE II    Fibromyalgia 2018   GERD (gastroesophageal reflux disease)    MIGRAINE, COMMON    Obesity, unspecified    OVARIAN CYST    Past Surgical History:  Procedure Laterality Date   NO PAST SURGERIES     Social History   Socioeconomic History   Marital status: Single    Spouse name: Not on file    Number of children: Not on file   Years of education: Not on file   Highest education level: Bachelor's degree (e.g., BA, AB, BS)  Occupational History   Not on file  Tobacco Use   Smoking status: Former    Current packs/day: 0.00    Types: Cigarettes    Quit date: 08/20/2002    Years since quitting: 20.5   Smokeless tobacco: Never   Tobacco comments:    single, separated from spouse 02/2010.   Vaping Use   Vaping status: Never Used  Substance and Sexual Activity   Alcohol use: Yes    Comment: couple of drinks a week   Drug use: No   Sexual activity: Not Currently    Birth control/protection: I.U.D.  Other Topics Concern   Not on file  Social History Narrative   Single, separated from spouse 02/2010   Social Determinants of Health   Financial Resource Strain: Low Risk  (11/18/2022)   Overall Financial Resource Strain (CARDIA)    Difficulty of Paying Living Expenses: Not very hard  Food Insecurity: No Food Insecurity (11/18/2022)   Hunger Vital Sign    Worried About Running Out of Food in the Last Year: Never true    Ran Out of Food in the Last Year: Never true  Transportation Needs: No Transportation Needs (11/18/2022)   PRAPARE - Administrator, Civil Service (Medical): No    Lack of Transportation (Non-Medical): No  Physical Activity: Unknown (11/18/2022)   Exercise Vital Sign    Days of Exercise per Week: Patient declined    Minutes of Exercise per Session: Not on file  Stress: Stress Concern Present (11/18/2022)   [employer] of Occupational Health - Occupational Stress Questionnaire    Feeling of Stress : To some extent  Social Connections: Socially Integrated (11/18/2022)   Social Connection and Isolation Panel [NHANES]    Frequency of Communication with Friends and Family: Twice a week    Frequency of Social Gatherings with Friends and Family: Once a week    Attends Religious Services: More than 4 times per year    Active Member of [employer] or Organizations:  Yes    Attends Engineer, structural: More than 4 times per year    Marital Status: Living with partner  Intimate Partner Violence: Not on file   Current Outpatient Medications on File Prior to Visit  Medication Sig Dispense Refill   ALPRAZolam (XANAX) 0.5 MG tablet TAKE 1 TABLET(0.5 MG) BY MOUTH THREE TIMES DAILY AS NEEDED FOR ANXIETY 60 tablet  5   amphetamine-dextroamphetamine (ADDERALL) 20 MG tablet Take 1 tablet (20 mg total) by mouth 2 (two) times daily. 180 tablet 0   aspirin 81 MG chewable tablet CHEW AND SWALLOW 1 TABLET(81 MG) BY MOUTH TWICE DAILY FOR BLOOD CLOT PREVENTION 180 tablet 0   celecoxib (CELEBREX) 100 MG capsule TAKE 1 CAPSULE(100 MG) BY MOUTH TWICE DAILY 60 capsule 0   Continuous Blood Gluc Receiver (DEXCOM G6 RECEIVER) DEVI by Does not apply route.     Continuous Blood Gluc Sensor (FREESTYLE LIBRE 2 SENSOR) MISC APPLY EVERY 14 DAYS 2 each 2   Continuous Blood Gluc Transmit (DEXCOM G6 TRANSMITTER) MISC by Does not apply route.     cyanocobalamin (VITAMIN B12) 1000 MCG/ML injection Inject 1 mL (1,000 mcg total) into the muscle every 30 (thirty) days. 10 mL 1   DULoxetine (CYMBALTA) 60 MG capsule Take 1 capsule (60 mg total) by mouth daily. 90 capsule 2   EPINEPHrine 0.3 mg/0.3 mL IJ SOAJ injection Use as needed for hypersensitivity reaction 1 each 0   Fexofenadine HCl (ALLEGRA ALLERGY PO) Take by mouth.     glucose blood (ONETOUCH VERIO) test strip Use 2x a day 200 each 3   insulin regular (HUMULIN R) 100 units/mL injection INJECT8-10 UNITS INTO THE SKIN THREE TIMES DAILY BEFORE MEALS. DISCARD OPEN VIAL AFTER 28 DAYS.     Insulin Syringe-Needle U-100 (B-D INS SYR ULTRAFINE .5CC/30G) 30G X 1/2" 0.5 ML MISC Use to inject insulin once daily. 100 each 11   levonorgestrel (MIRENA) 20 MCG/24HR IUD 1 Intra Uterine Device (1 each total) by Intrauterine route once. 1 each 0   meloxicam (MOBIC) 15 MG tablet Take 1 tablet (15 mg total) by mouth daily. 30 tablet 0   meloxicam  (MOBIC) 15 MG tablet Take 1 tablet (15 mg total) by mouth daily. 30 tablet 0   metFORMIN (GLUCOPHAGE) 1000 MG tablet TAKE 2 TABLETS BY MOUTH DAILY WITH SUPPER. 180 tablet 0   methocarbamol (ROBAXIN) 500 MG tablet Take 1 tablet (500 mg total) by mouth every 8 (eight) hours as needed for muscle spasms. 30 tablet 1   metoprolol succinate (TOPROL-XL) 25 MG 24 hr tablet TAKE 1 TABLET(25 MG) BY MOUTH DAILY 90 tablet 1   omeprazole (PRILOSEC) 40 MG capsule TAKE 1 CAPULE BY MOUTH BEFORE SUPPER DAILY 90 capsule 2   ondansetron (ZOFRAN ODT) 4 MG disintegrating tablet Take 1 tablet (4 mg total) by mouth every 8 (eight) hours as needed for nausea or vomiting. 20 tablet 0   oxyCODONE (ROXICODONE) 5 MG immediate release tablet Take 1 tablet (5 mg total) by mouth every 4 (four) hours as needed for severe pain. 30 tablet 0   promethazine (PHENERGAN) 25 MG tablet Take 1 tablet (25 mg total) by mouth every 6 (six) hours as needed for nausea. 15 tablet 0   rizatriptan (MAXALT) 10 MG tablet Take 1 tablet (10 mg total) by mouth as needed for migraine. May repeat in 2 hours if needed 10 tablet 8   rosuvastatin (CRESTOR) 5 MG tablet TAKE 1 TABLET(5 MG TOTAL) BY MOUTH 2(TWO) TIMES A WEEK 13 tablet 3   Semaglutide,0.25 or 0.5MG /DOS, (OZEMPIC, 0.25 OR 0.5 MG/DOSE,) 2 MG/3ML SOPN INJECT 0.5MG  UNDER THE SKIN ONCE A WEEK 9 mL 3   SYRINGE-NEEDLE, DISP, 3 ML 25G X 1" 3 ML MISC Use for monthly B12 injections 12 each 0   triamcinolone cream (KENALOG) 0.1 % Apply 1 application topically 2 (two) times daily as needed (for itching). 28.4 g  1   No current facility-administered medications on file prior to visit.   Allergies  Allergen Reactions   Chicken Allergy Anaphylaxis and Shortness Of Breath   Covid-19 Mrna Vacc (Moderna) Anaphylaxis   Other Itching, Swelling and Other (See Comments)    NUTS (of ANY kind): Lips and mouth swell, but no breathing impairment & migraines and eczema are triggered   Propofol Shortness Of Breath     SOB, chest tightness, wheezing after colonoscopy on 09-18-15   Basaglar Kwikpen [Insulin Glargine] Palpitations and Hypertension   Adhesive [Tape] Other (See Comments)    Makes the skin VERY RED    Gluten Meal Diarrhea   Peanut-Containing Drug Products Itching, Swelling and Other (See Comments)    Lips and mouth swell, but no breathing impairment & migraines and eczema are triggered    Novolog [Insulin Aspart] Rash   Evaristo Bury [Insulin Degludec] Rash    eczema   Victoza [Liraglutide] Rash   Family History  Problem Relation Age of Onset   Diabetes Mother    Hyperlipidemia Mother    CAD Mother    CVA Mother    Diabetes Father    Hyperlipidemia Father    Coronary artery disease Father    Hypertension Father    Cancer Father        esoph   Stroke Father    Cancer Maternal Grandfather 29       Prostate and Lung   Diabetes Sister        Gestational   Graves' disease Maternal Aunt    Cancer Maternal Uncle        Brain   Stroke Paternal Grandmother    Heart disease Paternal Grandmother    Stroke Paternal Grandfather    Heart disease Paternal Grandfather    Diabetes Sister        Gestational and Type II   Hashimoto's thyroiditis Cousin    Cancer Maternal Uncle        Brain   Objective:   Physical Exam BP 118/76   Pulse 98   Ht 5\' 4"  (1.626 m)   Wt 176 lb 12.8 oz (80.2 kg)   SpO2 99%   BMI 30.35 kg/m   Wt Readings from Last 3 Encounters:  03/02/23 176 lb 12.8 oz (80.2 kg)  02/11/23 178 lb (80.7 kg)  01/29/23 178 lb (80.7 kg)   Constitutional: overweight, in NAD Eyes: EOMI, no exophthalmos ENT: no thyromegaly, no cervical lymphadenopathy Cardiovascular: tachycardia, RR, No MRG Respiratory: CTA B Musculoskeletal: no deformities Skin: no rashes Neurological: no tremor with outstretched hands  Assessment:     1. DM2, insulin-dependent, uncontrolled, without complications - r/o autoimmunity Component     Latest Ref Rng 08/17/2012  Glutamic Acid Decarb Ab     <=1.0  U/mL <1.0  Pancreatic Islet Cell Antibody     < 5 JDF Units <5  C-Peptide     0.80 - 3.90 ng/mL 1.84  Glucose     70 - 99 mg/dL 696 (H)     Component     Latest Ref Rng 10/31/2022  Glucose     65 - 99 mg/dL 84   C-Peptide     2.95 - 3.85 ng/mL 1.47    2. Obesity class 1  3. HL  Plan:     1. Patient with longstanding, previously uncontrolled type 2 diabetes, on metformin, weekly GLP-1 receptor agonist, previously on long-acting insulin and prn regular insulin only before large meals.  At last visit, sugars were fluctuating  within the target range but getting closer to the lower limit of the target range and occasionally lower during the day.  We discussed about reducing the dose of NPH gradually and then stopping it if the sugars remained controlled.  Her insulin production and antipancreatic antibodies were normal in 2014 and we repeated her insulin CGM interpretation: -At today's visit, we reviewed her CGM downloads: It appears that 96% of values are in target range (goal >70%), while 4% are higher than 180 (goal <25%), and 0% are lower than 70 (goal <4%).  The calculated average blood sugar is 133. -Reviewing the CGM trends, sugars are fluctuating within the target range with only occasional mild hyperglycemic spikes.  She stopped her insulin since last visit and sugars did not increase.  She came off the CGM a month ago as she does not think that this is covered by her insurance without being on insulin.  I would like for her to stay on the CGM only for blood sugar checks but also for possible alarms and for behavioral modification.  I called in a prescription for the freestyle libre 3 to [facility].  We discussed that if this is not covered, she will need to check sugars manually.  For now, we will continue only metformin and Ozempic.  I refilled her metformin. - I suggested to:  Patient Instructions  Please continue: - Metformin 1000 mg 2x a day with meals - Ozempic 0.5 mg  weekly  Please return in 4-6 months.  - we checked her HbA1c: 6.4% (lower)  - advised to check sugars at different times of the day - 4x a day, rotating check times - advised for yearly eye exams >> she is UTD - return to clinic in 4-6 months  2. Obesity class 1 -Ozempic was initially poorly tolerated but she then started to tolerate it better and now has no consistent side effects from it -She lost approximately 70 pounds after starting Ozempic (17 before last visit); she gained 17 pounds right before her last visit due to surgery and subsequent immobility, but she restarted walking right before last visit. - lost 10 lbs since last OV  3. HL -Reviewed latest lipid panel from 11/2022: Fractions at goal with the exception of a high LDL: Lab Results  Component Value Date   CHOL 198 11/20/2022   HDL 59.70 11/20/2022   LDLCALC 121 (H) 11/20/2022   TRIG 87.0 11/20/2022   CHOLHDL 3 11/20/2022  -She is on Crestor 5 mg twice a week.  She does have muscle and joint aches but she does not feel that these are related to the statin, but due to her fibromyalgia.    Carlus Pavlov, MD PhD [facility]

## 2023-03-02 NOTE — Patient Instructions (Signed)
Please continue: - Metformin 1000 mg 2x a day with meals - Ozempic 0.5 mg weekly  Please return in 4-6 months.

## 2023-03-03 NOTE — Progress Notes (Signed)
Aleen Sells [name] [address] [place] [address] Phone: [phone]   Assessment and Plan:    1. Right hip pain -Subacute, mild improvement, subsequent visit - Overall improvement after intra-articular hip CSI performed at previous office visit on 02/11/2023, however patient continues to have sharp hip pain in a C-shaped pattern and now with symptoms radiating into right leg - Since patient had some relief with intra-articular hip injection, I feel that patient may have intra-articular hip pathology.  No significant findings on x-ray.  No signs of lumbar pathology on physical exam or HPI.  Due to failure to improve with >6 weeks which has included NSAID course, HEP, CSI, I recommend further evaluation with right hip MRI with contrast.  Order placed today -Discussed that ideally we could wait longer before restarting NSAID course, however patient's wedding is in 10 days.  We will restart NSAID course to decrease pain through wedding and honeymoon.  Start meloxicam 15 mg daily x2 weeks.  If still having pain after 2 weeks, complete 3rd-week of meloxicam. May use remaining meloxicam as needed once daily for pain control.  Do not to use additional NSAIDs while taking meloxicam.  May use Tylenol [phone] mg 2 to 3 times a day for breakthrough pain.    Pertinent previous records reviewed include none   Follow Up: 4 days after MRI to review results and discuss treatment plan   Subjective:   I, Jerene Canny, am serving as a Neurosurgeon for Doctor Richardean Sale   Chief Complaint: right hip pain    HPI:    01/29/23 Patient is a 42 year old female complaining of right hip pain. Patient states that she has been in pain for about a month , has pain when she goes up stares. Yesterday she had a really bad flare the pain radiates to her glute. She isn't able to sleep and or lift her leg when she is laying. The pain is sharpe and shooting down the front  of her shin. The pain has made her lower back tighten up. Antalgic gait, if she twist and turns she has a sharp pain, pain when sitting , ADLS are painful all around. Ice , heat , meloxicam short term , ibu aren't touching the pain. Does get tingling but no numbness . Isnt able to workout on the stationary bike because it is painful . She is getting married August 3rd     02/11/2023 Patient states she is better not all the way resolved but much improved. Pain with sleep and sitting and lifting to chest   03/04/2023 Patient states much better than last time, still has pain but its after she does activity goes up stairs. Endorses antalgic gait . Pain now radiates down her thigh and to her foot . She is not able to lay on her right side     Relevant Historical Information: DM type II  Additional pertinent review of systems negative.   Current Outpatient Medications:    ALPRAZolam (XANAX) 0.5 MG tablet, TAKE 1 TABLET(0.5 MG) BY MOUTH THREE TIMES DAILY AS NEEDED FOR ANXIETY, Disp: 60 tablet, Rfl: 5   amphetamine-dextroamphetamine (ADDERALL) 20 MG tablet, Take 1 tablet (20 mg total) by mouth 2 (two) times daily., Disp: 180 tablet, Rfl: 0   aspirin 81 MG chewable tablet, CHEW AND SWALLOW 1 TABLET(81 MG) BY MOUTH TWICE DAILY FOR BLOOD CLOT PREVENTION, Disp: 180 tablet, Rfl: 0   celecoxib (CELEBREX) 100 MG capsule, TAKE 1  CAPSULE(100 MG) BY MOUTH TWICE DAILY, Disp: 60 capsule, Rfl: 0   Continuous Glucose Sensor (FREESTYLE LIBRE 3 SENSOR) MISC, 1 each by Does not apply route every 14 (fourteen) days., Disp: 6 each, Rfl: 3   cyanocobalamin (VITAMIN B12) 1000 MCG/ML injection, Inject 1 mL (1,000 mcg total) into the muscle every 30 (thirty) days., Disp: 10 mL, Rfl: 1   DULoxetine (CYMBALTA) 60 MG capsule, Take 1 capsule (60 mg total) by mouth daily., Disp: 90 capsule, Rfl: 2   EPINEPHrine 0.3 mg/0.3 mL IJ SOAJ injection, Use as needed for hypersensitivity reaction, Disp: 1 each, Rfl: 0   Fexofenadine HCl  (ALLEGRA ALLERGY PO), Take by mouth., Disp: , Rfl:    glucose blood (ONETOUCH VERIO) test strip, Use 2x a day, Disp: 200 each, Rfl: 3   Insulin Syringe-Needle U-100 (B-D INS SYR ULTRAFINE .5CC/30G) 30G X 1/2" 0.5 ML MISC, Use to inject insulin once daily., Disp: 100 each, Rfl: 11   levonorgestrel (MIRENA) 20 MCG/24HR IUD, 1 Intra Uterine Device (1 each total) by Intrauterine route once., Disp: 1 each, Rfl: 0   meloxicam (MOBIC) 15 MG tablet, Take 1 tablet (15 mg total) by mouth daily., Disp: 30 tablet, Rfl: 0   meloxicam (MOBIC) 15 MG tablet, Take 1 tablet (15 mg total) by mouth daily., Disp: 30 tablet, Rfl: 0   metFORMIN (GLUCOPHAGE) 1000 MG tablet, TAKE 2 TABLETS BY MOUTH DAILY WITH SUPPER., Disp: 180 tablet, Rfl: 3   methocarbamol (ROBAXIN) 500 MG tablet, Take 1 tablet (500 mg total) by mouth every 8 (eight) hours as needed for muscle spasms., Disp: 30 tablet, Rfl: 1   metoprolol succinate (TOPROL-XL) 25 MG 24 hr tablet, TAKE 1 TABLET(25 MG) BY MOUTH DAILY, Disp: 90 tablet, Rfl: 1   omeprazole (PRILOSEC) 40 MG capsule, TAKE 1 CAPULE BY MOUTH BEFORE SUPPER DAILY, Disp: 90 capsule, Rfl: 2   ondansetron (ZOFRAN ODT) 4 MG disintegrating tablet, Take 1 tablet (4 mg total) by mouth every 8 (eight) hours as needed for nausea or vomiting., Disp: 20 tablet, Rfl: 0   oxyCODONE (ROXICODONE) 5 MG immediate release tablet, Take 1 tablet (5 mg total) by mouth every 4 (four) hours as needed for severe pain., Disp: 30 tablet, Rfl: 0   promethazine (PHENERGAN) 25 MG tablet, Take 1 tablet (25 mg total) by mouth every 6 (six) hours as needed for nausea., Disp: 15 tablet, Rfl: 0   rizatriptan (MAXALT) 10 MG tablet, Take 1 tablet (10 mg total) by mouth as needed for migraine. May repeat in 2 hours if needed, Disp: 10 tablet, Rfl: 8   rosuvastatin (CRESTOR) 5 MG tablet, TAKE 1 TABLET(5 MG TOTAL) BY MOUTH 2(TWO) TIMES A WEEK, Disp: 13 tablet, Rfl: 3   Semaglutide,0.25 or 0.5MG /DOS, (OZEMPIC, 0.25 OR 0.5 MG/DOSE,) 2  MG/3ML SOPN, INJECT 0.5MG  UNDER THE SKIN ONCE A WEEK, Disp: 9 mL, Rfl: 3   SYRINGE-NEEDLE, DISP, 3 ML 25G X 1" 3 ML MISC, Use for monthly B12 injections, Disp: 12 each, Rfl: 0   triamcinolone cream (KENALOG) 0.1 %, Apply 1 application topically 2 (two) times daily as needed (for itching)., Disp: 28.4 g, Rfl: 1   Objective:     Vitals:   03/04/23 1554  Pulse: 93  SpO2: 96%  Weight: 176 lb (79.8 kg)  Height: 5\' 4"  (1.626 m)      Body mass index is 30.21 kg/m.    Physical Exam:    General: awake, alert, and oriented no acute distress, nontoxic Skin: no suspicious lesions or  rashes Neuro:sensation intact distally with no deficits, normal muscle tone, no atrophy, strength 5/5 in all tested lower ext groups Psych: normal mood and affect, speech clear   Right hip: No deformity, swelling or wasting ROM Flexion 80 with pain, ext 30, IR 30 with pain, ER 40 with pain TTP hip flexors, greater trochanter NTTP over the   gluteal musculature, si joint, lumbar spine Negative log roll with FROM Positive FABER for C-shaped pain Positive FADIR for C-shaped pain Positive piriformis test for groin pain without radicular symptoms Positive right trendelenberg Gait antalgic, favoring left leg      Electronically signed by:  Aleen Sells [name] 4:08 PM 03/04/23

## 2023-03-04 ENCOUNTER — Ambulatory Visit (INDEPENDENT_AMBULATORY_CARE_PROVIDER_SITE_OTHER): Payer: 59 | Admitting: Sports Medicine

## 2023-03-04 VITALS — HR 93 | Ht 64.0 in | Wt 176.0 lb

## 2023-03-04 DIAGNOSIS — M25551 Pain in right hip: Secondary | ICD-10-CM | POA: Diagnosis not present

## 2023-03-04 NOTE — Patient Instructions (Addendum)
OPI: lets be friends  Lisa Duran: chit chat  Right hip MRI w contrast  - Start meloxicam 15 mg daily x2 weeks.  If still having pain after 2 weeks, complete 3rd-week of meloxicam. May use remaining meloxicam as needed once daily for pain control.  Do not to use additional NSAIDs while taking meloxicam.  May use Tylenol [phone] mg 2 to 3 times a day for breakthrough pain. Continue HEP  Follow up 4 days after MRI to discuss results

## 2023-03-24 ENCOUNTER — Telehealth: Payer: Self-pay

## 2023-03-24 NOTE — Telephone Encounter (Signed)
Denial of the hip MRI stating patient needs six weeks of doctor provided treatment and a follow up to discus failure of those treatments before approving the MRI. Looks like patient will be at 6 weeks August 31st. I can re run patient once she has a follow up appointment

## 2023-03-27 ENCOUNTER — Encounter: Payer: Self-pay | Admitting: Sports Medicine

## 2023-04-01 ENCOUNTER — Other Ambulatory Visit: Payer: Self-pay | Admitting: Internal Medicine

## 2023-04-02 MED ORDER — AMPHETAMINE-DEXTROAMPHETAMINE 20 MG PO TABS: 20.0000 mg | ORAL_TABLET | Freq: Two times a day (BID) | ORAL | 0 refills | Status: DC

## 2023-04-14 ENCOUNTER — Other Ambulatory Visit: Payer: Self-pay | Admitting: Internal Medicine

## 2023-04-15 ENCOUNTER — Other Ambulatory Visit: Payer: Self-pay | Admitting: Sports Medicine

## 2023-04-15 NOTE — Telephone Encounter (Signed)
I talked to them we are doing with contrast Arthrogram. It is all set, they are supposed to call you back. If they don't, you may want to reach back out to them

## 2023-04-15 NOTE — Addendum Note (Signed)
Addended by: Jerene Canny R on: 04/15/2023 02:38 PM   Modules accepted: Orders

## 2023-05-05 ENCOUNTER — Encounter: Payer: Self-pay | Admitting: Internal Medicine

## 2023-05-05 MED ORDER — ALPRAZOLAM 0.5 MG PO TABS: ORAL_TABLET | ORAL | 5 refills | Status: DC

## 2023-05-11 ENCOUNTER — Encounter: Payer: Self-pay | Admitting: Internal Medicine

## 2023-05-12 ENCOUNTER — Ambulatory Visit (INDEPENDENT_AMBULATORY_CARE_PROVIDER_SITE_OTHER): Payer: 59 | Admitting: Internal Medicine

## 2023-05-12 ENCOUNTER — Other Ambulatory Visit: Payer: Self-pay | Admitting: Internal Medicine

## 2023-05-12 ENCOUNTER — Encounter: Payer: Self-pay | Admitting: Internal Medicine

## 2023-05-12 VITALS — BP 114/86 | HR 88 | Temp 98.3°F | Ht 64.0 in | Wt 179.0 lb

## 2023-05-12 DIAGNOSIS — J0100 Acute maxillary sinusitis, unspecified: Secondary | ICD-10-CM

## 2023-05-12 DIAGNOSIS — J019 Acute sinusitis, unspecified: Secondary | ICD-10-CM | POA: Insufficient documentation

## 2023-05-12 MED ORDER — FLUCONAZOLE 150 MG PO TABS: 150.0000 mg | ORAL_TABLET | Freq: Once | ORAL | 0 refills | Status: AC

## 2023-05-12 MED ORDER — AMOXICILLIN-POT CLAVULANATE 875-125 MG PO TABS: 1.0000 | ORAL_TABLET | Freq: Two times a day (BID) | ORAL | 0 refills | Status: DC

## 2023-05-12 NOTE — Patient Instructions (Addendum)
     Medications changes include :   Augmentin and diflucan        Return if symptoms worsen or fail to improve.

## 2023-05-12 NOTE — Assessment & Plan Note (Signed)
Acute Likely bacterial  Start Augmentin 875-125 mg BID x 10 day Diflucan if needed for yeast infection otc cold medications Rest, fluid Call if no improvement

## 2023-05-12 NOTE — Progress Notes (Signed)
Subjective:    Patient ID: Lisa Duran, female    DOB: [birth year], 42 y.o.   MRN: [id number]      HPI Lisa Duran is here for  Chief Complaint  Patient presents with   Sinus Problem    Headache sinus pressure over a week    She is here for an acute visit for cold symptoms.   Her symptoms started just over one week ago  She is experiencing cold symptoms that have progressed.  She states sweats, congestion, ear pain, sinus pain, pressure, sore throat, hoarseness, cough and headaches.  No SOB, fever, wheeze.    She has tried taking mucinex, tylenol   Home covid test is negative.      Medications and allergies reviewed with patient and updated if appropriate.  Current Outpatient Medications on File Prior to Visit  Medication Sig Dispense Refill   ALPRAZolam (XANAX) 0.5 MG tablet TAKE 1 TABLET(0.5 MG) BY MOUTH THREE TIMES DAILY AS NEEDED FOR ANXIETY 60 tablet 5   amphetamine-dextroamphetamine (ADDERALL) 20 MG tablet Take 1 tablet (20 mg total) by mouth 2 (two) times daily. 180 tablet 0   aspirin 81 MG chewable tablet CHEW AND SWALLOW 1 TABLET(81 MG) BY MOUTH TWICE DAILY FOR BLOOD CLOT PREVENTION 180 tablet 0   celecoxib (CELEBREX) 100 MG capsule TAKE 1 CAPSULE(100 MG) BY MOUTH TWICE DAILY 60 capsule 0   Continuous Glucose Sensor (FREESTYLE LIBRE 3 SENSOR) MISC 1 each by Does not apply route every 14 (fourteen) days. 6 each 3   cyanocobalamin (VITAMIN B12) 1000 MCG/ML injection Inject 1 mL (1,000 mcg total) into the muscle every 30 (thirty) days. 10 mL 1   DULoxetine (CYMBALTA) 60 MG capsule Take 1 capsule (60 mg total) by mouth daily. 90 capsule 2   EPINEPHrine 0.3 mg/0.3 mL IJ SOAJ injection Use as needed for hypersensitivity reaction 1 each 0   Fexofenadine HCl (ALLEGRA ALLERGY PO) Take by mouth.     glucose blood (ONETOUCH VERIO) test strip Use 2x a day 200 each 3   Insulin Syringe-Needle U-100 (B-D INS SYR ULTRAFINE .5CC/30G) 30G X 1/2" 0.5 ML MISC Use to inject insulin once  daily. 100 each 11   levonorgestrel (MIRENA) 20 MCG/24HR IUD 1 Intra Uterine Device (1 each total) by Intrauterine route once. 1 each 0   meloxicam (MOBIC) 15 MG tablet Take 1 tablet (15 mg total) by mouth daily. 30 tablet 0   meloxicam (MOBIC) 15 MG tablet Take 1 tablet (15 mg total) by mouth daily. 30 tablet 0   metFORMIN (GLUCOPHAGE) 1000 MG tablet TAKE 2 TABLETS BY MOUTH DAILY WITH SUPPER. 180 tablet 3   methocarbamol (ROBAXIN) 500 MG tablet Take 1 tablet (500 mg total) by mouth every 8 (eight) hours as needed for muscle spasms. 30 tablet 1   omeprazole (PRILOSEC) 40 MG capsule TAKE 1 CAPSULE BY MOUTH DAILY BEFORE SUPPER 90 capsule 2   ondansetron (ZOFRAN ODT) 4 MG disintegrating tablet Take 1 tablet (4 mg total) by mouth every 8 (eight) hours as needed for nausea or vomiting. 20 tablet 0   oxyCODONE (ROXICODONE) 5 MG immediate release tablet Take 1 tablet (5 mg total) by mouth every 4 (four) hours as needed for severe pain. 30 tablet 0   promethazine (PHENERGAN) 25 MG tablet Take 1 tablet (25 mg total) by mouth every 6 (six) hours as needed for nausea. 15 tablet 0   rizatriptan (MAXALT) 10 MG tablet Take 1 tablet (10 mg total) by mouth as needed for  migraine. May repeat in 2 hours if needed 10 tablet 8   rosuvastatin (CRESTOR) 5 MG tablet TAKE 1 TABLET(5 MG TOTAL) BY MOUTH 2(TWO) TIMES A WEEK 13 tablet 3   Semaglutide,0.25 or 0.5MG /DOS, (OZEMPIC, 0.25 OR 0.5 MG/DOSE,) 2 MG/3ML SOPN INJECT 0.5MG  UNDER THE SKIN ONCE A WEEK 9 mL 3   SYRINGE-NEEDLE, DISP, 3 ML 25G X 1" 3 ML MISC Use for monthly B12 injections 12 each 0   triamcinolone cream (KENALOG) 0.1 % Apply 1 application topically 2 (two) times daily as needed (for itching). 28.4 g 1   No current facility-administered medications on file prior to visit.    Review of Systems  Constitutional:  Positive for diaphoresis (mild). Negative for fever.  HENT:  Positive for congestion (improved), ear pain (pressure right ear, fullness), postnasal  drip, sinus pressure (right side and frontal region), sinus pain, sore throat (last week) and voice change.   Respiratory:  Positive for cough. Negative for shortness of breath and wheezing.   Neurological:  Positive for headaches.       Objective:   Vitals:   05/12/23 1443  BP: 114/86  Pulse: 88  Temp: 98.3 F (36.8 C)  SpO2: 96%   BP Readings from Last 3 Encounters:  05/12/23 114/86  03/02/23 118/76  02/11/23 102/80   Wt Readings from Last 3 Encounters:  05/12/23 179 lb (81.2 kg)  03/04/23 176 lb (79.8 kg)  03/02/23 176 lb 12.8 oz (80.2 kg)   Body mass index is 30.73 kg/m.    Physical Exam Constitutional:      General: She is not in acute distress.    Appearance: Normal appearance. She is not ill-appearing.  HENT:     Head: Normocephalic and atraumatic.     Right Ear: Tympanic membrane, ear canal and external ear normal.     Left Ear: Tympanic membrane, ear canal and external ear normal.     Mouth/Throat:     Mouth: Mucous membranes are moist.     Pharynx: No oropharyngeal exudate or posterior oropharyngeal erythema.  Eyes:     Conjunctiva/sclera: Conjunctivae normal.  Cardiovascular:     Rate and Rhythm: Normal rate and regular rhythm.  Pulmonary:     Effort: Pulmonary effort is normal. No respiratory distress.     Breath sounds: Normal breath sounds. No wheezing or rales.  Musculoskeletal:     Cervical back: Neck supple. No tenderness.  Lymphadenopathy:     Cervical: No cervical adenopathy.  Skin:    General: Skin is warm and dry.  Neurological:     Mental Status: She is alert.            Assessment & Plan:    See Problem List for Assessment and Plan of chronic medical problems.

## 2023-05-14 ENCOUNTER — Ambulatory Visit
Admission: RE | Admit: 2023-05-14 | Discharge: 2023-05-14 | Disposition: A | Discharge status: Home / Self Care | Payer: 59 | Source: Ambulatory Visit | Attending: Sports Medicine | Admitting: Sports Medicine

## 2023-05-14 DIAGNOSIS — M25551 Pain in right hip: Secondary | ICD-10-CM

## 2023-05-14 MED ORDER — IOPAMIDOL (ISOVUE-M 200) INJECTION 41%
10.0000 mL | Freq: Once | INTRAMUSCULAR | Status: AC
  Administered 2023-05-14: 10 mL via INTRA_ARTICULAR

## 2023-05-20 NOTE — Progress Notes (Unsigned)
Lisa Duran [name] [address] [place] [address] Phone: [phone]   Assessment and Plan:     There are no diagnoses linked to this encounter.  ***   Pertinent previous records reviewed include ***   Follow Up: ***     Subjective:   I, Lisa Duran, am serving as a Neurosurgeon for Doctor Richardean Sale   Chief Complaint: right hip pain    HPI:    01/29/23 Patient is a 42 year old female complaining of right hip pain. Patient states that she has been in pain for about a month , has pain when she goes up stares. Yesterday she had a really bad flare the pain radiates to her glute. She isn't able to sleep and or lift her leg when she is laying. The pain is sharpe and shooting down the front of her shin. The pain has made her lower back tighten up. Antalgic gait, if she twist and turns she has a sharp pain, pain when sitting , ADLS are painful all around. Ice , heat , meloxicam short term , ibu aren't touching the pain. Does get tingling but no numbness . Isnt able to workout on the stationary bike because it is painful . She is getting married August 3rd     02/11/2023 Patient states she is better not all the way resolved but much improved. Pain with sleep and sitting and lifting to chest   03/04/2023 Patient states much better than last time, still has pain but its after she does activity goes up stairs. Endorses antalgic gait . Pain now radiates down her thigh and to her foot . She is not able to lay on her right side    05/21/2023 Patient states   Relevant Historical Information: DM type II  Additional pertinent review of systems negative.   Current Outpatient Medications:    ALPRAZolam (XANAX) 0.5 MG tablet, TAKE 1 TABLET(0.5 MG) BY MOUTH THREE TIMES DAILY AS NEEDED FOR ANXIETY, Disp: 60 tablet, Rfl: 5   amoxicillin-clavulanate (AUGMENTIN) 875-125 MG tablet, Take 1 tablet by mouth 2 (two) times daily for 10 days., Disp: 20  tablet, Rfl: 0   amphetamine-dextroamphetamine (ADDERALL) 20 MG tablet, Take 1 tablet (20 mg total) by mouth 2 (two) times daily., Disp: 180 tablet, Rfl: 0   aspirin 81 MG chewable tablet, CHEW AND SWALLOW 1 TABLET(81 MG) BY MOUTH TWICE DAILY FOR BLOOD CLOT PREVENTION, Disp: 180 tablet, Rfl: 0   celecoxib (CELEBREX) 100 MG capsule, TAKE 1 CAPSULE(100 MG) BY MOUTH TWICE DAILY, Disp: 60 capsule, Rfl: 0   Continuous Glucose Sensor (FREESTYLE LIBRE 3 SENSOR) MISC, 1 each by Does not apply route every 14 (fourteen) days., Disp: 6 each, Rfl: 3   cyanocobalamin (VITAMIN B12) 1000 MCG/ML injection, Inject 1 mL (1,000 mcg total) into the muscle every 30 (thirty) days., Disp: 10 mL, Rfl: 1   DULoxetine (CYMBALTA) 60 MG capsule, Take 1 capsule (60 mg total) by mouth daily., Disp: 90 capsule, Rfl: 2   EPINEPHrine 0.3 mg/0.3 mL IJ SOAJ injection, Use as needed for hypersensitivity reaction, Disp: 1 each, Rfl: 0   Fexofenadine HCl (ALLEGRA ALLERGY PO), Take by mouth., Disp: , Rfl:    glucose blood (ONETOUCH VERIO) test strip, Use 2x a day, Disp: 200 each, Rfl: 3   Insulin Syringe-Needle U-100 (B-D INS SYR ULTRAFINE .5CC/30G) 30G X 1/2" 0.5 ML MISC, Use to inject insulin once daily., Disp: 100 each, Rfl: 11   levonorgestrel (MIRENA)  20 MCG/24HR IUD, 1 Intra Uterine Device (1 each total) by Intrauterine route once., Disp: 1 each, Rfl: 0   meloxicam (MOBIC) 15 MG tablet, Take 1 tablet (15 mg total) by mouth daily., Disp: 30 tablet, Rfl: 0   meloxicam (MOBIC) 15 MG tablet, Take 1 tablet (15 mg total) by mouth daily., Disp: 30 tablet, Rfl: 0   metFORMIN (GLUCOPHAGE) 1000 MG tablet, TAKE 2 TABLETS BY MOUTH DAILY WITH SUPPER., Disp: 180 tablet, Rfl: 3   methocarbamol (ROBAXIN) 500 MG tablet, Take 1 tablet (500 mg total) by mouth every 8 (eight) hours as needed for muscle spasms., Disp: 30 tablet, Rfl: 1   metoprolol succinate (TOPROL-XL) 25 MG 24 hr tablet, TAKE 1 TABLET BY MOUTH DAILY, Disp: 90 tablet, Rfl: 3    omeprazole (PRILOSEC) 40 MG capsule, TAKE 1 CAPSULE BY MOUTH DAILY BEFORE SUPPER, Disp: 90 capsule, Rfl: 2   ondansetron (ZOFRAN ODT) 4 MG disintegrating tablet, Take 1 tablet (4 mg total) by mouth every 8 (eight) hours as needed for nausea or vomiting., Disp: 20 tablet, Rfl: 0   oxyCODONE (ROXICODONE) 5 MG immediate release tablet, Take 1 tablet (5 mg total) by mouth every 4 (four) hours as needed for severe pain., Disp: 30 tablet, Rfl: 0   promethazine (PHENERGAN) 25 MG tablet, Take 1 tablet (25 mg total) by mouth every 6 (six) hours as needed for nausea., Disp: 15 tablet, Rfl: 0   rizatriptan (MAXALT) 10 MG tablet, Take 1 tablet (10 mg total) by mouth as needed for migraine. May repeat in 2 hours if needed, Disp: 10 tablet, Rfl: 8   rosuvastatin (CRESTOR) 5 MG tablet, TAKE 1 TABLET(5 MG TOTAL) BY MOUTH 2(TWO) TIMES A WEEK, Disp: 13 tablet, Rfl: 3   Semaglutide,0.25 or 0.5MG /DOS, (OZEMPIC, 0.25 OR 0.5 MG/DOSE,) 2 MG/3ML SOPN, INJECT 0.5MG  UNDER THE SKIN ONCE A WEEK, Disp: 9 mL, Rfl: 3   SYRINGE-NEEDLE, DISP, 3 ML 25G X 1" 3 ML MISC, Use for monthly B12 injections, Disp: 12 each, Rfl: 0   triamcinolone cream (KENALOG) 0.1 %, Apply 1 application topically 2 (two) times daily as needed (for itching)., Disp: 28.4 g, Rfl: 1   Objective:     There were no vitals filed for this visit.    There is no height or weight on file to calculate BMI.    Physical Exam:    ***   Electronically signed by:  Lisa Duran [name] 3:15 PM 05/20/23

## 2023-05-21 ENCOUNTER — Ambulatory Visit: Payer: 59 | Admitting: Sports Medicine

## 2023-05-21 ENCOUNTER — Encounter: Payer: Self-pay | Admitting: Internal Medicine

## 2023-05-21 VITALS — BP 110/80 | HR 98 | Ht 64.0 in | Wt 179.0 lb

## 2023-05-21 DIAGNOSIS — G8929 Other chronic pain: Secondary | ICD-10-CM

## 2023-05-21 DIAGNOSIS — M25551 Pain in right hip: Secondary | ICD-10-CM

## 2023-05-21 DIAGNOSIS — M25562 Pain in left knee: Secondary | ICD-10-CM | POA: Diagnosis not present

## 2023-05-21 DIAGNOSIS — M1712 Unilateral primary osteoarthritis, left knee: Secondary | ICD-10-CM | POA: Diagnosis not present

## 2023-05-21 DIAGNOSIS — S73191A Other sprain of right hip, initial encounter: Secondary | ICD-10-CM | POA: Diagnosis not present

## 2023-05-21 NOTE — Patient Instructions (Addendum)
Flu immunization administered today.     Blood work was ordered.   The lab is on the first floor.    Medications changes include :   None     Return in about 6 months (around 11/20/2023) for follow up.    Health Maintenance, Female Adopting a healthy lifestyle and getting preventive care are important in promoting health and wellness. Ask your health care provider about: The right schedule for you to have regular tests and exams. Things you can do on your own to prevent diseases and keep yourself healthy. What should I know about diet, weight, and exercise? Eat a healthy diet  Eat a diet that includes plenty of vegetables, fruits, low-fat dairy products, and lean protein. Do not eat a lot of foods that are high in solid fats, added sugars, or sodium. Maintain a healthy weight Body mass index (BMI) is used to identify weight problems. It estimates body fat based on height and weight. Your health care provider can help determine your BMI and help you achieve or maintain a healthy weight. Get regular exercise Get regular exercise. This is one of the most important things you can do for your health. Most adults should: Exercise for at least 150 minutes each week. The exercise should increase your heart rate and make you sweat (moderate-intensity exercise). Do strengthening exercises at least twice a week. This is in addition to the moderate-intensity exercise. Spend less time sitting. Even light physical activity can be beneficial. Watch cholesterol and blood lipids Have your blood tested for lipids and cholesterol at 44 years of age, then have this test every 5 years. Have your cholesterol levels checked more often if: Your lipid or cholesterol levels are high. You are older than 44 years of age. You are at high risk for heart disease. What should I know about cancer screening? Depending on your health history and family history, you may need to have cancer screening at  various ages. This may include screening for: Breast cancer. Cervical cancer. Colorectal cancer. Skin cancer. Lung cancer. What should I know about heart disease, diabetes, and high blood pressure? Blood pressure and heart disease High blood pressure causes heart disease and increases the risk of stroke. This is more likely to develop in people who have high blood pressure readings or are overweight. Have your blood pressure checked: Every 3-5 years if you are 34-86 years of age. Every year if you are 79 years old or older. Diabetes Have regular diabetes screenings. This checks your fasting blood sugar level. Have the screening done: Once every three years after age 109 if you are at a normal weight and have a low risk for diabetes. More often and at a younger age if you are overweight or have a high risk for diabetes. What should I know about preventing infection? Hepatitis B If you have a higher risk for hepatitis B, you should be screened for this virus. Talk with your health care provider to find out if you are at risk for hepatitis B infection. Hepatitis C Testing is recommended for: Everyone born from [birth year] through 1967. Anyone with known risk factors for hepatitis C. Sexually transmitted infections (STIs) Get screened for STIs, including gonorrhea and chlamydia, if: You are sexually active and are younger than 44 years of age. You are older than 44 years of age and your health care provider tells you that you are at risk for this type of infection. Your sexual activity has changed since  you were last screened, and you are at increased risk for chlamydia or gonorrhea. Ask your health care provider if you are at risk. Ask your health care provider about whether you are at high risk for HIV. Your health care provider may recommend a prescription medicine to help prevent HIV infection. If you choose to take medicine to prevent HIV, you should first get tested for HIV. You should then be  tested every 3 months for as long as you are taking the medicine. Pregnancy If you are about to stop having your period (premenopausal) and you may become pregnant, seek counseling before you get pregnant. Take 400 to 800 micrograms (mcg) of folic acid every day if you become pregnant. Ask for birth control (contraception) if you want to prevent pregnancy. Osteoporosis and menopause Osteoporosis is a disease in which the bones lose minerals and strength with aging. This can result in bone fractures. If you are 47 years old or older, or if you are at risk for osteoporosis and fractures, ask your health care provider if you should: Be screened for bone loss. Take a calcium or vitamin D supplement to lower your risk of fractures. Be given hormone replacement therapy (HRT) to treat symptoms of menopause. Follow these instructions at home: Alcohol use Do not drink alcohol if: Your health care provider tells you not to drink. You are pregnant, may be pregnant, or are planning to become pregnant. If you drink alcohol: Limit how much you have to: 0-1 drink a day. Know how much alcohol is in your drink. In the [place] one drink equals one 12 oz bottle of beer (355 mL), one 5 oz glass of wine (148 mL), or one 1 oz glass of hard liquor (44 mL). Lifestyle Do not use any products that contain nicotine or tobacco. These products include cigarettes, chewing tobacco, and vaping devices, such as e-cigarettes. If you need help quitting, ask your health care provider. Do not use street drugs. Do not share needles. Ask your health care provider for help if you need support or information about quitting drugs. General instructions Schedule regular health, dental, and eye exams. Stay current with your vaccines. Tell your health care provider if: You often feel depressed. You have ever been abused or do not feel safe at home. Summary Adopting a healthy lifestyle and getting preventive care are important in  promoting health and wellness. Follow your health care provider's instructions about healthy diet, exercising, and getting tested or screened for diseases. Follow your health care provider's instructions on monitoring your cholesterol and blood pressure. This information is not intended to replace advice given to you by your health care provider. Make sure you discuss any questions you have with your health care provider. Document Revised: 12/17/2020 Document Reviewed: 12/17/2020 Elsevier Patient Education  2024 [employer].

## 2023-05-21 NOTE — Patient Instructions (Signed)
Ortho referral  As needed follow up

## 2023-05-21 NOTE — Progress Notes (Signed)
Subjective:    Patient ID: Lisa Duran, female    DOB: 29-Mar-1981, 42 y.o.   MRN: [id number]      HPI Fernando is here for a Physical exam and her chronic medical problems.    Has labral tear right hip.  Will see ortho.      Medications and allergies reviewed with patient and updated if appropriate.  Current Outpatient Medications on File Prior to Visit  Medication Sig Dispense Refill   ALPRAZolam (XANAX) 0.5 MG tablet TAKE 1 TABLET(0.5 MG) BY MOUTH THREE TIMES DAILY AS NEEDED FOR ANXIETY 60 tablet 5   amphetamine-dextroamphetamine (ADDERALL) 20 MG tablet Take 1 tablet (20 mg total) by mouth 2 (two) times daily. 180 tablet 0   aspirin 81 MG chewable tablet CHEW AND SWALLOW 1 TABLET(81 MG) BY MOUTH TWICE DAILY FOR BLOOD CLOT PREVENTION 180 tablet 0   celecoxib (CELEBREX) 100 MG capsule TAKE 1 CAPSULE(100 MG) BY MOUTH TWICE DAILY 60 capsule 0   Continuous Glucose Sensor (FREESTYLE LIBRE 3 SENSOR) MISC 1 each by Does not apply route every 14 (fourteen) days. 6 each 3   cyanocobalamin (VITAMIN B12) 1000 MCG/ML injection Inject 1 mL (1,000 mcg total) into the muscle every 30 (thirty) days. 10 mL 1   EPINEPHrine 0.3 mg/0.3 mL IJ SOAJ injection Use as needed for hypersensitivity reaction 1 each 0   Fexofenadine HCl (ALLEGRA ALLERGY PO) Take by mouth.     glucose blood (ONETOUCH VERIO) test strip Use 2x a day 200 each 3   Insulin Syringe-Needle U-100 (B-D INS SYR ULTRAFINE .5CC/30G) 30G X 1/2" 0.5 ML MISC Use to inject insulin once daily. 100 each 11   levonorgestrel (MIRENA) 20 MCG/24HR IUD 1 Intra Uterine Device (1 each total) by Intrauterine route once. 1 each 0   meloxicam (MOBIC) 15 MG tablet Take 1 tablet (15 mg total) by mouth daily. 30 tablet 0   meloxicam (MOBIC) 15 MG tablet Take 1 tablet (15 mg total) by mouth daily. 30 tablet 0   metFORMIN (GLUCOPHAGE) 1000 MG tablet TAKE 2 TABLETS BY MOUTH DAILY WITH SUPPER. 180 tablet 3   methocarbamol (ROBAXIN) 500 MG tablet Take 1 tablet  (500 mg total) by mouth every 8 (eight) hours as needed for muscle spasms. 30 tablet 1   metoprolol succinate (TOPROL-XL) 25 MG 24 hr tablet TAKE 1 TABLET BY MOUTH DAILY 90 tablet 3   omeprazole (PRILOSEC) 40 MG capsule TAKE 1 CAPSULE BY MOUTH DAILY BEFORE SUPPER 90 capsule 2   ondansetron (ZOFRAN ODT) 4 MG disintegrating tablet Take 1 tablet (4 mg total) by mouth every 8 (eight) hours as needed for nausea or vomiting. 20 tablet 0   promethazine (PHENERGAN) 25 MG tablet Take 1 tablet (25 mg total) by mouth every 6 (six) hours as needed for nausea. 15 tablet 0   rizatriptan (MAXALT) 10 MG tablet Take 1 tablet (10 mg total) by mouth as needed for migraine. May repeat in 2 hours if needed 10 tablet 8   rosuvastatin (CRESTOR) 5 MG tablet TAKE 1 TABLET(5 MG TOTAL) BY MOUTH 2(TWO) TIMES A WEEK 13 tablet 3   Semaglutide,0.25 or 0.5MG /DOS, (OZEMPIC, 0.25 OR 0.5 MG/DOSE,) 2 MG/3ML SOPN INJECT 0.5MG  UNDER THE SKIN ONCE A WEEK 9 mL 3   SYRINGE-NEEDLE, DISP, 3 ML 25G X 1" 3 ML MISC Use for monthly B12 injections 12 each 0   triamcinolone cream (KENALOG) 0.1 % Apply 1 application topically 2 (two) times daily as needed (for itching). 28.4 g 1  No current facility-administered medications on file prior to visit.    Review of Systems  Constitutional:  Negative for fever.  Eyes:  Negative for visual disturbance.  Respiratory:  Negative for cough, shortness of breath and wheezing.   Cardiovascular:  Positive for chest pain (better - shorter episodes) and palpitations (occ). Negative for leg swelling.  Gastrointestinal:  Negative for abdominal pain, blood in stool, constipation and diarrhea.       No gerd  Genitourinary:  Negative for dysuria.  Musculoskeletal:  Positive for arthralgias. Negative for back pain.  Skin:  Negative for rash.  Neurological:  Positive for light-headedness (occ - sometimes with palps) and headaches (weather and sinus).  Psychiatric/Behavioral:  Negative for dysphoric mood and sleep  disturbance. The patient is not nervous/anxious.        Objective:   Vitals:   05/22/23 0818  BP: 106/78  Pulse: 98  Temp: 98.7 F (37.1 C)  SpO2: 97%   Filed Weights   05/22/23 0818  Weight: 178 lb (80.7 kg)   Body mass index is 30.55 kg/m.  BP Readings from Last 3 Encounters:  05/22/23 106/78  05/21/23 110/80  05/12/23 114/86    Wt Readings from Last 3 Encounters:  05/22/23 178 lb (80.7 kg)  05/21/23 179 lb (81.2 kg)  05/12/23 179 lb (81.2 kg)       Physical Exam Constitutional: She appears well-developed and well-nourished. No distress.  HENT:  Head: Normocephalic and atraumatic.  Right Ear: External ear normal. Normal ear canal and TM Left Ear: External ear normal.  Normal ear canal and TM Mouth/Throat: Oropharynx is clear and moist.  Eyes: Conjunctivae normal.  Neck: Neck supple. No tracheal deviation present. No thyromegaly present.  No carotid bruit  Cardiovascular: Normal rate, regular rhythm and normal heart sounds.   No murmur heard.  No edema. Pulmonary/Chest: Effort normal and breath sounds normal. No respiratory distress. She has no wheezes. She has no rales.  Breast: deferred   Abdominal: Soft. She exhibits no distension. There is no tenderness.  Lymphadenopathy: She has no cervical adenopathy.  Skin: Skin is warm and dry. She is not diaphoretic.  Psychiatric: She has a normal mood and affect. Her behavior is normal.     Lab Results  Component Value Date   WBC 8.7 11/20/2022   HGB 13.4 11/20/2022   HCT 41.4 11/20/2022   PLT 332.0 11/20/2022   GLUCOSE 117 (H) 11/20/2022   CHOL 198 11/20/2022   TRIG 87.0 11/20/2022   HDL 59.70 11/20/2022   LDLCALC 121 (H) 11/20/2022   ALT 9 11/20/2022   AST 13 11/20/2022   NA 136 11/20/2022   K 4.7 11/20/2022   CL 102 11/20/2022   CREATININE 0.68 11/20/2022   BUN 9 11/20/2022   CO2 26 11/20/2022   TSH 2.21 05/20/2022   INR 1.1 01/11/2019   HGBA1C 6.5 (A) 10/31/2022   MICROALBUR 1.2 05/20/2022          Assessment & Plan:   Physical exam: Screening blood work  ordered Exercise regular - water aerobics Weight-encourage weight loss Substance abuse  none   Reviewed recommended immunizations.   Flu immunization administered today.     Health Maintenance  Topic Date Due   Cervical Cancer Screening (HPV/Pap Cotest)  10/12/2015   MAMMOGRAM  05/03/2020   OPHTHALMOLOGY EXAM  07/27/2021   INFLUENZA VACCINE  03/12/2023   Diabetic kidney evaluation - Urine ACR  05/21/2023   HEMOGLOBIN A1C  05/03/2023   COVID-19 Vaccine (2 -  2023-24 season) 06/07/2023 (Originally 04/12/2023)   FOOT EXAM  10/31/2023   Diabetic kidney evaluation - eGFR measurement  11/20/2023   DTaP/Tdap/Td (4 - Td or Tdap) 02/24/2028   HIV Screening  Completed   HPV VACCINES  Aged Out   Hepatitis C Screening  Discontinued      Mammogram and Pap up-to-date-will get reports    See Problem List for Assessment and Plan of chronic medical problems.

## 2023-05-22 ENCOUNTER — Telehealth: Payer: Self-pay

## 2023-05-22 ENCOUNTER — Ambulatory Visit: Payer: 59 | Admitting: Internal Medicine

## 2023-05-22 VITALS — BP 106/78 | HR 98 | Temp 98.7°F | Ht 64.0 in | Wt 178.0 lb

## 2023-05-22 DIAGNOSIS — E1149 Type 2 diabetes mellitus with other diabetic neurological complication: Secondary | ICD-10-CM | POA: Diagnosis not present

## 2023-05-22 DIAGNOSIS — F419 Anxiety disorder, unspecified: Secondary | ICD-10-CM

## 2023-05-22 DIAGNOSIS — D508 Other iron deficiency anemias: Secondary | ICD-10-CM

## 2023-05-22 DIAGNOSIS — E785 Hyperlipidemia, unspecified: Secondary | ICD-10-CM | POA: Diagnosis not present

## 2023-05-22 DIAGNOSIS — F900 Attention-deficit hyperactivity disorder, predominantly inattentive type: Secondary | ICD-10-CM

## 2023-05-22 DIAGNOSIS — Z0000 Encounter for general adult medical examination without abnormal findings: Secondary | ICD-10-CM | POA: Diagnosis not present

## 2023-05-22 DIAGNOSIS — M797 Fibromyalgia: Secondary | ICD-10-CM

## 2023-05-22 DIAGNOSIS — E538 Deficiency of other specified B group vitamins: Secondary | ICD-10-CM

## 2023-05-22 DIAGNOSIS — Z7984 Long term (current) use of oral hypoglycemic drugs: Secondary | ICD-10-CM

## 2023-05-22 DIAGNOSIS — R000 Tachycardia, unspecified: Secondary | ICD-10-CM

## 2023-05-22 DIAGNOSIS — Z23 Encounter for immunization: Secondary | ICD-10-CM

## 2023-05-22 DIAGNOSIS — G43009 Migraine without aura, not intractable, without status migrainosus: Secondary | ICD-10-CM

## 2023-05-22 DIAGNOSIS — F3289 Other specified depressive episodes: Secondary | ICD-10-CM

## 2023-05-22 DIAGNOSIS — K219 Gastro-esophageal reflux disease without esophagitis: Secondary | ICD-10-CM

## 2023-05-22 DIAGNOSIS — D72825 Bandemia: Secondary | ICD-10-CM

## 2023-05-22 LAB — CBC WITH DIFFERENTIAL/PLATELET
Basophils Absolute: 0 10*3/uL (ref 0.0–0.1)
Basophils Relative: 0.1 % (ref 0.0–3.0)
Eosinophils Absolute: 0 10*3/uL (ref 0.0–0.7)
Eosinophils Relative: 0 % (ref 0.0–5.0)
HCT: 43.5 % (ref 36.0–46.0)
Hemoglobin: 14.1 g/dL (ref 12.0–15.0)
Lymphocytes Relative: 5.9 % — ABNORMAL LOW (ref 12.0–46.0)
Lymphs Abs: 1.1 10*3/uL (ref 0.7–4.0)
MCHC: 32.3 g/dL (ref 30.0–36.0)
MCV: 83.4 fL (ref 78.0–100.0)
Monocytes Absolute: 0.8 10*3/uL (ref 0.1–1.0)
Monocytes Relative: 4.6 % (ref 3.0–12.0)
Neutro Abs: 16.5 10*3/uL — ABNORMAL HIGH (ref 1.4–7.7)
Neutrophils Relative %: 89.4 % — ABNORMAL HIGH (ref 43.0–77.0)
Platelets: 508 10*3/uL — ABNORMAL HIGH (ref 150.0–400.0)
RBC: 5.22 Mil/uL — ABNORMAL HIGH (ref 3.87–5.11)
RDW: 14.4 % (ref 11.5–15.5)
WBC: 18.5 10*3/uL (ref 4.0–10.5)

## 2023-05-22 LAB — IBC PANEL
Iron: 45 ug/dL (ref 42–145)
Saturation Ratios: 9.9 % — ABNORMAL LOW (ref 20.0–50.0)
TIBC: 453.6 ug/dL — ABNORMAL HIGH (ref 250.0–450.0)
Transferrin: 324 mg/dL (ref 212.0–360.0)

## 2023-05-22 LAB — COMPREHENSIVE METABOLIC PANEL
ALT: 12 U/L (ref 0–35)
AST: 11 U/L (ref 0–37)
Albumin: 4.6 g/dL (ref 3.5–5.2)
Alkaline Phosphatase: 96 U/L (ref 39–117)
BUN: 14 mg/dL (ref 6–23)
CO2: 23 meq/L (ref 19–32)
Calcium: 10.3 mg/dL (ref 8.4–10.5)
Chloride: 99 meq/L (ref 96–112)
Creatinine, Ser: 0.75 mg/dL (ref 0.40–1.20)
GFR: 96.69 mL/min (ref >60.00)
Glucose, Bld: 257 mg/dL — ABNORMAL HIGH (ref 70–99)
Potassium: 4.1 meq/L (ref 3.5–5.1)
Sodium: 134 meq/L — ABNORMAL LOW (ref 135–145)
Total Bilirubin: 0.3 mg/dL (ref 0.2–1.2)
Total Protein: 7.6 g/dL (ref 6.0–8.3)

## 2023-05-22 LAB — LIPID PANEL
Cholesterol: 160 mg/dL (ref 0–200)
HDL: 77.6 mg/dL (ref >39.00)
LDL Cholesterol: 65 mg/dL (ref 0–99)
NonHDL: 82.49
Total CHOL/HDL Ratio: 2
Triglycerides: 86 mg/dL (ref 0.0–149.0)
VLDL: 17.2 mg/dL (ref 0.0–40.0)

## 2023-05-22 LAB — MICROALBUMIN / CREATININE URINE RATIO
Creatinine,U: 110.9 mg/dL
Microalb Creat Ratio: 0.9 mg/g (ref 0.0–30.0)
Microalb, Ur: 1 mg/dL (ref 0.0–1.9)

## 2023-05-22 LAB — VITAMIN B12: Vitamin B-12: 1034 pg/mL — ABNORMAL HIGH (ref 211–911)

## 2023-05-22 LAB — FERRITIN: Ferritin: 13.5 ng/mL (ref 10.0–291.0)

## 2023-05-22 LAB — TSH: TSH: 0.64 u[IU]/mL (ref 0.35–5.50)

## 2023-05-22 LAB — HEMOGLOBIN A1C: Hgb A1c MFr Bld: 6.7 % — ABNORMAL HIGH (ref 4.6–6.5)

## 2023-05-22 NOTE — Assessment & Plan Note (Signed)
Chronic GERD controlled, occ flare Continue omeprazole 40 mg every other day to every other 2 days

## 2023-05-22 NOTE — Assessment & Plan Note (Signed)
Chronic Regular exercise and healthy diet encouraged Check lipid panel  Continue Crestor 5 mg 2 days a week

## 2023-05-22 NOTE — Assessment & Plan Note (Addendum)
Chronic Controlled, Stable Continue Xanax 0.5 mg 3 times daily as needed

## 2023-05-22 NOTE — Addendum Note (Signed)
Addended by: Pincus Sanes on: 05/22/2023 12:37 PM   Modules accepted: Orders

## 2023-05-22 NOTE — Assessment & Plan Note (Signed)
Chronic Controlled, Stable Continue metoprolol XL 25 mg daily

## 2023-05-22 NOTE — Assessment & Plan Note (Addendum)
Chronic Controlled Stopped duloxetine - has had occ mild flares since being off - manageable Stressed regular exercise, stress reduction

## 2023-05-22 NOTE — Assessment & Plan Note (Signed)
Chronic Management per Dr. Elvera Lennox   Lab Results  Component Value Date   HGBA1C 6.5 (A) 10/31/2022   Sugars controlled Check urine microalbumin Stressed regular exercise, diabetic diet

## 2023-05-22 NOTE — Assessment & Plan Note (Addendum)
Chronic Controlled, Stable Continue Adderall 20 mg bid

## 2023-05-22 NOTE — Assessment & Plan Note (Addendum)
Chronic Controlled, Stable Stopped duloxetine - doing well - monitor w/o medication for now

## 2023-05-22 NOTE — Addendum Note (Signed)
Addended by: Karma Ganja on: 05/22/2023 04:31 PM   Modules accepted: Orders

## 2023-05-22 NOTE — Assessment & Plan Note (Signed)
Chronic Doing B12 injections monthly at home Check B12 level 

## 2023-05-22 NOTE — Assessment & Plan Note (Signed)
Chronic Controlled Continue Maxalt 10 mg daily as needed or over-the-counter Excedrin Migraine Also on metoprolol

## 2023-05-22 NOTE — Assessment & Plan Note (Signed)
Chronic Does not tolerate oral iron-causes constipation Check iron panel, CBC

## 2023-05-22 NOTE — Telephone Encounter (Signed)
Result note sent to patient.  Elevated WBC likely related to steroid injection she had yesterday.  Will repeat CBC in 2 weeks

## 2023-06-03 ENCOUNTER — Ambulatory Visit (HOSPITAL_BASED_OUTPATIENT_CLINIC_OR_DEPARTMENT_OTHER): Payer: 59 | Admitting: Orthopaedic Surgery

## 2023-06-03 DIAGNOSIS — S73191A Other sprain of right hip, initial encounter: Secondary | ICD-10-CM | POA: Diagnosis not present

## 2023-06-03 NOTE — Progress Notes (Signed)
Chief Complaint: Right hip pain     History of Present Illness:    Lisa Duran is a 42 y.o. female presents with right hip pain after June.  She did not have any specific injury although she was helping to move at work at this time.  She works locally for [employer].  She does have a history of a left knee meniscal root repair done with Dr. August Saucer in January of this year.  She has previously had an injection into the right hip with Dr. Jean Rosenthal which gave her 1 week of relief.  She has been working on a home exercise program.  She states that the pain is in the C-shaped distribution of the right hip and groin area.  She states this is catching and clicking.    Surgical History:   None  PMH/PSH/Family History/Social History/Meds/Allergies:    Past Medical History:  Diagnosis Date   ADHD (attention deficit hyperactivity disorder)    ALLERGIC RHINITIS    Allergy    Anemia    ANXIETY    Depression    DIABETES MELLITUS, TYPE II    Fibromyalgia 2018   GERD (gastroesophageal reflux disease)    MIGRAINE, COMMON    Obesity, unspecified    OVARIAN CYST    Past Surgical History:  Procedure Laterality Date   NO PAST SURGERIES     Social History   Socioeconomic History   Marital status: Married    Spouse name: Not on file   Number of children: Not on file   Years of education: Not on file   Highest education level: Bachelor's degree (e.g., BA, AB, BS)  Occupational History   Not on file  Tobacco Use   Smoking status: Former    Current packs/day: 0.00    Types: Cigarettes    Quit date: 08/20/2002    Years since quitting: 20.8   Smokeless tobacco: Never   Tobacco comments:    single, separated from spouse 02/2010.   Vaping Use   Vaping status: Never Used  Substance and Sexual Activity   Alcohol use: Yes    Comment: couple of drinks a week   Drug use: No   Sexual activity: Not Currently    Birth control/protection: I.U.D.  Other  Topics Concern   Not on file  Social History Narrative   Single, separated from spouse 02/2010   Social Determinants of Health   Financial Resource Strain: Low Risk  (11/18/2022)   Overall Financial Resource Strain (CARDIA)    Difficulty of Paying Living Expenses: Not very hard  Food Insecurity: No Food Insecurity (11/18/2022)   Hunger Vital Sign    Worried About Running Out of Food in the Last Year: Never true    Ran Out of Food in the Last Year: Never true  Transportation Needs: No Transportation Needs (11/18/2022)   PRAPARE - Administrator, Civil Service (Medical): No    Lack of Transportation (Non-Medical): No  Physical Activity: Unknown (11/18/2022)   Exercise Vital Sign    Days of Exercise per Week: Patient declined    Minutes of Exercise per Session: Not on file  Stress: Stress Concern Present (11/18/2022)   [employer] of Occupational Health - Occupational Stress Questionnaire    Feeling of Stress : To some extent  Social Connections:  Socially Integrated (11/18/2022)   Social Connection and Isolation Panel [NHANES]    Frequency of Communication with Friends and Family: Twice a week    Frequency of Social Gatherings with Friends and Family: Once a week    Attends Religious Services: More than 4 times per year    Active Member of [employer] or Organizations: Yes    Attends Engineer, structural: More than 4 times per year    Marital Status: Living with partner   Family History  Problem Relation Age of Onset   Diabetes Mother    Hyperlipidemia Mother    CAD Mother    CVA Mother    Diabetes Father    Hyperlipidemia Father    Coronary artery disease Father    Hypertension Father    Cancer Father        esoph   Stroke Father    Cancer Maternal Grandfather 79       Prostate and Lung   Diabetes Sister        Gestational   Graves' disease Maternal Aunt    Cancer Maternal Uncle        Brain   Stroke Paternal Grandmother    Heart disease Paternal  Grandmother    Stroke Paternal Grandfather    Heart disease Paternal Grandfather    Diabetes Sister        Gestational and Type II   Hashimoto's thyroiditis Cousin    Cancer Maternal Uncle        Brain   Allergies  Allergen Reactions   Chicken Allergy Anaphylaxis and Shortness Of Breath   Covid-19 Mrna Vacc (Moderna) Anaphylaxis   Other Itching, Swelling and Other (See Comments)    NUTS (of ANY kind): Lips and mouth swell, but no breathing impairment & migraines and eczema are triggered   Propofol Shortness Of Breath    SOB, chest tightness, wheezing after colonoscopy on 09-18-15   Basaglar Kwikpen [Insulin Glargine] Palpitations and Hypertension   Adhesive [Tape] Other (See Comments)    Makes the skin VERY RED    Gluten Meal Diarrhea   Peanut-Containing Drug Products Itching, Swelling and Other (See Comments)    Lips and mouth swell, but no breathing impairment & migraines and eczema are triggered    Novolog [Insulin Aspart] Rash   Evaristo Bury [Insulin Degludec] Rash    eczema   Victoza [Liraglutide] Rash   Current Outpatient Medications  Medication Sig Dispense Refill   ALPRAZolam (XANAX) 0.5 MG tablet TAKE 1 TABLET(0.5 MG) BY MOUTH THREE TIMES DAILY AS NEEDED FOR ANXIETY 60 tablet 5   amphetamine-dextroamphetamine (ADDERALL) 20 MG tablet Take 1 tablet (20 mg total) by mouth 2 (two) times daily. 180 tablet 0   aspirin 81 MG chewable tablet CHEW AND SWALLOW 1 TABLET(81 MG) BY MOUTH TWICE DAILY FOR BLOOD CLOT PREVENTION 180 tablet 0   celecoxib (CELEBREX) 100 MG capsule TAKE 1 CAPSULE(100 MG) BY MOUTH TWICE DAILY 60 capsule 0   Continuous Glucose Sensor (FREESTYLE LIBRE 3 SENSOR) MISC 1 each by Does not apply route every 14 (fourteen) days. 6 each 3   cyanocobalamin (VITAMIN B12) 1000 MCG/ML injection Inject 1 mL (1,000 mcg total) into the muscle every 30 (thirty) days. 10 mL 1   EPINEPHrine 0.3 mg/0.3 mL IJ SOAJ injection Use as needed for hypersensitivity reaction 1 each 0    Fexofenadine HCl (ALLEGRA ALLERGY PO) Take by mouth.     glucose blood (ONETOUCH VERIO) test strip Use 2x a day 200 each 3  Insulin Syringe-Needle U-100 (B-D INS SYR ULTRAFINE .5CC/30G) 30G X 1/2" 0.5 ML MISC Use to inject insulin once daily. 100 each 11   levonorgestrel (MIRENA) 20 MCG/24HR IUD 1 Intra Uterine Device (1 each total) by Intrauterine route once. 1 each 0   meloxicam (MOBIC) 15 MG tablet Take 1 tablet (15 mg total) by mouth daily. 30 tablet 0   meloxicam (MOBIC) 15 MG tablet Take 1 tablet (15 mg total) by mouth daily. 30 tablet 0   metFORMIN (GLUCOPHAGE) 1000 MG tablet TAKE 2 TABLETS BY MOUTH DAILY WITH SUPPER. 180 tablet 3   methocarbamol (ROBAXIN) 500 MG tablet Take 1 tablet (500 mg total) by mouth every 8 (eight) hours as needed for muscle spasms. 30 tablet 1   metoprolol succinate (TOPROL-XL) 25 MG 24 hr tablet TAKE 1 TABLET BY MOUTH DAILY 90 tablet 3   omeprazole (PRILOSEC) 40 MG capsule TAKE 1 CAPSULE BY MOUTH DAILY BEFORE SUPPER 90 capsule 2   ondansetron (ZOFRAN ODT) 4 MG disintegrating tablet Take 1 tablet (4 mg total) by mouth every 8 (eight) hours as needed for nausea or vomiting. 20 tablet 0   promethazine (PHENERGAN) 25 MG tablet Take 1 tablet (25 mg total) by mouth every 6 (six) hours as needed for nausea. 15 tablet 0   rizatriptan (MAXALT) 10 MG tablet Take 1 tablet (10 mg total) by mouth as needed for migraine. May repeat in 2 hours if needed 10 tablet 8   rosuvastatin (CRESTOR) 5 MG tablet TAKE 1 TABLET(5 MG TOTAL) BY MOUTH 2(TWO) TIMES A WEEK 13 tablet 3   Semaglutide,0.25 or 0.5MG /DOS, (OZEMPIC, 0.25 OR 0.5 MG/DOSE,) 2 MG/3ML SOPN INJECT 0.5MG  UNDER THE SKIN ONCE A WEEK 9 mL 3   SYRINGE-NEEDLE, DISP, 3 ML 25G X 1" 3 ML MISC Use for monthly B12 injections 12 each 0   triamcinolone cream (KENALOG) 0.1 % Apply 1 application topically 2 (two) times daily as needed (for itching). 28.4 g 1   No current facility-administered medications for this visit.   No results  found.  Review of Systems:   A ROS was performed including pertinent positives and negatives as documented in the HPI.  Physical Exam :   Constitutional: NAD and appears stated age Neurological: Alert and oriented Psych: Appropriate affect and cooperative There were no vitals taken for this visit.   Comprehensive Musculoskeletal Exam:    Inspection Right Left  Skin No atrophy or gross abnormalities appreciated No atrophy or gross abnormalities appreciated  Palpation    Tenderness None None  Crepitus None None  Range of Motion    Flexion (passive) 120 120  Extension 30 30  IR 30 with pain 30  ER 40 45  Strength    Flexion  5/5 5/5  Extension 5/5 5/5  Special Tests    FABER Negative Negative  FADIR Positive Negative  ER Lag/Capsular Insufficiency Negative Negative  Instability Negative Negative  Sacroiliac pain Negative  Negative   Instability    Generalized Laxity No No  Neurologic    sciatic, femoral, obturator nerves intact to light sensation  Vascular/Lymphatic    DP pulse 2+ 2+  Lumbar Exam    Patient has symmetric lumbar range of motion with negative pain referral to hip     Imaging:   Xray (4 views right hip): Normal  MRI (right hip): Anterior superior labral tear  I personally reviewed and interpreted the radiographs.   Assessment:   42 y.o. female with an anterior superior labral tear of the  right hip which is consistent with her C-shaped pain and instability.  She is experiencing mechanical symptoms at this time.  She has previously had an injection with Dr. Jean Rosenthal which gave her temporary very significant relief.  She has been working on a home exercise strengthening program.  Given this we did discuss additional options.  I do believe that she would be a candidate for right hip arthroscopy with labral repair.  At this time I do believe that enrolling her in physical therapy for prehab would be beneficial to help get her strengthening the right hip.  I  did discuss risks and limitations associated with this.  I did discuss the rehab associated with arthroscopic labral pair.  After discussion she is elected for this  Plan :    -Plan for right hip arthroscopy with labral repair   After a lengthy discussion of treatment options, including risks, benefits, alternatives, complications of surgical and nonsurgical conservative options, the patient elected surgical repair.   The patient  is aware of the material risks  and complications including, but not limited to injury to adjacent structures, neurovascular injury, infection, numbness, bleeding, implant failure, thermal burns, stiffness, persistent pain, failure to heal, disease transmission from allograft, need for further surgery, dislocation, anesthetic risks, blood clots, risks of death,and others. The probabilities of surgical success and failure discussed with patient given their particular co-morbidities.The time and nature of expected rehabilitation and recovery was discussed.The patient's questions were all answered preoperatively.  No barriers to understanding were noted. I explained the natural history of the disease process and Rx rationale.  I explained to the patient what I considered to be reasonable expectations given their personal situation.  The final treatment plan was arrived at through a shared patient decision making process model.      I personally saw and evaluated the patient, and participated in the management and treatment plan.  Huel Cote, MD Attending Physician, Orthopedic Surgery  This document was dictated using Dragon voice recognition software. A reasonable attempt at proof reading has been made to minimize errors.

## 2023-06-04 ENCOUNTER — Ambulatory Visit: Payer: 59 | Admitting: Dermatology

## 2023-06-04 ENCOUNTER — Encounter: Payer: Self-pay | Admitting: Dermatology

## 2023-06-04 VITALS — BP 99/76 | HR 94

## 2023-06-04 DIAGNOSIS — W908XXA Exposure to other nonionizing radiation, initial encounter: Secondary | ICD-10-CM

## 2023-06-04 DIAGNOSIS — Z1283 Encounter for screening for malignant neoplasm of skin: Secondary | ICD-10-CM

## 2023-06-04 DIAGNOSIS — D1801 Hemangioma of skin and subcutaneous tissue: Secondary | ICD-10-CM | POA: Diagnosis not present

## 2023-06-04 DIAGNOSIS — D2371 Other benign neoplasm of skin of right lower limb, including hip: Secondary | ICD-10-CM | POA: Diagnosis not present

## 2023-06-04 DIAGNOSIS — D239 Other benign neoplasm of skin, unspecified: Secondary | ICD-10-CM

## 2023-06-04 DIAGNOSIS — L821 Other seborrheic keratosis: Secondary | ICD-10-CM

## 2023-06-04 DIAGNOSIS — L570 Actinic keratosis: Secondary | ICD-10-CM | POA: Diagnosis not present

## 2023-06-04 DIAGNOSIS — L578 Other skin changes due to chronic exposure to nonionizing radiation: Secondary | ICD-10-CM

## 2023-06-04 DIAGNOSIS — D229 Melanocytic nevi, unspecified: Secondary | ICD-10-CM

## 2023-06-04 DIAGNOSIS — Z808 Family history of malignant neoplasm of other organs or systems: Secondary | ICD-10-CM

## 2023-06-04 DIAGNOSIS — L814 Other melanin hyperpigmentation: Secondary | ICD-10-CM | POA: Diagnosis not present

## 2023-06-04 NOTE — Progress Notes (Signed)
   New Patient Visit   Subjective  Lisa Duran is a 42 y.o. female who presents for the following: Skin Cancer Screening and Full Body Skin Exam  The patient presents for Total-Body Skin Exam (TBSE) for skin cancer screening and mole check. The patient has spots, moles and lesions to be evaluated, some may be new or changing/ Pt has hx of "precancer" and family hx of NMSC  The following portions of the chart were reviewed this encounter and updated as appropriate: medications, allergies, medical history  Review of Systems:  No other skin or systemic complaints except as noted in HPI or Assessment and Plan.  Objective  Well appearing patient in no apparent distress; mood and affect are within normal limits.  A full examination was performed including scalp, head, eyes, ears, nose, lips, neck, chest, axillae, abdomen, back, buttocks, bilateral upper extremities, bilateral lower extremities, hands, feet, fingers, toes, fingernails, and toenails. All findings within normal limits unless otherwise noted below.   Relevant physical exam findings are noted in the Assessment and Plan.    Assessment & Plan   SKIN CANCER SCREENING PERFORMED TODAY.  ACTINIC DAMAGE - Chronic condition, secondary to cumulative UV/sun exposure - diffuse scaly erythematous macules with underlying dyspigmentation - Recommend daily broad spectrum sunscreen SPF 30+ to sun-exposed areas, reapply every 2 hours as needed.  - Staying in the shade or wearing long sleeves, sun glasses (UVA+UVB protection) and wide brim hats (4-inch brim around the entire circumference of the hat) are also recommended for sun protection.  - Call for new or changing lesions.  MELANOCYTIC NEVI - Tan-brown and/or pink-flesh-colored symmetric macules and papules - Benign appearing on exam today - Observation - Call clinic for new or changing moles - Recommend daily use of broad spectrum spf 30+ sunscreen to sun-exposed areas.   SEBORRHEIC  KERATOSIS - Stuck-on, waxy, tan-brown papules and/or plaques  - Benign-appearing - Discussed benign etiology and prognosis. - Observe - Call for any changes  LENTIGINES Exam: scattered tan macules Due to sun exposure Treatment Plan: Benign-appearing, observe. Recommend daily broad spectrum sunscreen SPF 30+ to sun-exposed areas, reapply every 2 hours as needed.  Call for any changes    HEMANGIOMA Exam: red papule(s) Discussed benign nature. Recommend observation. Call for changes.   DERMATOFIBROMA right ankle Exam: Firm pink/brown papulenodule with dimple sign. Treatment Plan: A dermatofibroma is a benign growth possibly related to trauma, such as an insect bite, cut from shaving, or inflamed acne-type bump.  Treatment options to remove include shave or excision with resulting scar and risk of recurrence.  Since benign-appearing and not bothersome, will observe for now.    AK (actinic keratosis) Head - Anterior (Face)  Destruction of lesion - Head - Anterior (Face)  Destruction method: cryotherapy     Return in about 1 year (around 06/03/2024) for TBSE.  I, Tillie Fantasia, CMA, am acting as scribe for Gwenith Daily, MD.   Documentation: I have reviewed the above documentation for accuracy and completeness, and I agree with the above.  Gwenith Daily, MD

## 2023-06-04 NOTE — Patient Instructions (Addendum)
Skin Education :  We counseled the patient regarding the following: Sun screen (SPF 30 or greater) should be applied during peak UV exposure (between 10am and 2pm) and reapplied after exercise or swimming.  The ABCDEs of melanoma were reviewed with the patient, and the importance of monthly self-examination of moles was emphasized. Should any moles change in shape or color, or itch, bleed or burn, pt will contact our office for evaluation sooner then their interval appointment.  Plan: Sunscreen Recommendations We recommended a broad spectrum sunscreen with a SPF of 30 or higher. SPF 30 sunscreens block approximately 97 percent of the sun's harmful rays. Sunscreens should be applied at least 15 minutes prior to expected sun exposure and then every 2 hours after that as long as sun exposure continues. If swimming or exercising sunscreen should be reapplied every 45 minutes to an hour after getting wet or sweating. One ounce, or the equivalent of a shot glass full of sunscreen, is adequate to protect the skin not covered by a bathing suit. We also recommended a lip balm with a sunscreen as well. Sun protective clothing can be used in lieu of sunscreen but must be worn the entire time you are exposed to the sun's rays.   Important Information   Due to recent changes in healthcare laws, you may see results of your pathology and/or laboratory studies on MyChart before the doctors have had a chance to review them. We understand that in some cases there may be results that are confusing or concerning to you. Please understand that not all results are received at the same time and often the doctors may need to interpret multiple results in order to provide you with the best plan of care or course of treatment. Therefore, we ask that you please give [place] 2 business days to thoroughly review all your results before contacting the office for clarification. Should we see a critical lab result, you will be contacted  sooner.     If You Need Anything After Your Visit   If you have any questions or concerns for your doctor, please call our main line at [phone]. If no one answers, please leave a voicemail as directed and we will return your call as soon as possible. Messages left after 4 pm will be answered the following business day.    You may also send [place] a message via MyChart. We typically respond to MyChart messages within 1-2 business days.  For prescription refills, please ask your pharmacy to contact our office. Our fax number is [phone].  If you have an urgent issue when the clinic is closed that cannot wait until the next business day, you can page your doctor at the number below.     Please note that while we do our best to be available for urgent issues outside of office hours, we are not available 24/7.    If you have an urgent issue and are unable to reach [place], you may choose to seek medical care at your doctor's office, retail clinic, urgent care center, or emergency room.   If you have a medical emergency, please immediately call 911 or go to the emergency department. In the event of inclement weather, please call our main line at [phone] for an update on the status of any delays or closures.  Dermatology Medication Tips: Please keep the boxes that topical medications come in in order to help keep track of the instructions about where and how to use these. Pharmacies  typically print the medication instructions only on the boxes and not directly on the medication tubes.   If your medication is too expensive, please contact our office at [phone] or send [place] a message through MyChart.    We are unable to tell what your co-pay for medications will be in advance as this is different depending on your insurance coverage. However, we may be able to find a substitute medication at lower cost or fill out paperwork to get insurance to cover a needed medication.    If a prior  authorization is required to get your medication covered by your insurance company, please allow [place] 1-2 business days to complete this process.   Drug prices often vary depending on where the prescription is filled and some pharmacies may offer cheaper prices.   The website www.goodrx.com contains coupons for medications through different pharmacies. The prices here do not account for what the cost may be with help from insurance (it may be cheaper with your insurance), but the website can give you the price if you did not use any insurance.  - You can print the associated coupon and take it with your prescription to the pharmacy.  - You may also stop by our office during regular business hours and pick up a GoodRx coupon card.  - If you need your prescription sent electronically to a different pharmacy, notify our office through [facility] or by phone at [phone]

## 2023-06-06 ENCOUNTER — Other Ambulatory Visit: Payer: Self-pay | Admitting: Internal Medicine

## 2023-06-06 ENCOUNTER — Ambulatory Visit (HOSPITAL_BASED_OUTPATIENT_CLINIC_OR_DEPARTMENT_OTHER): Payer: 59 | Attending: Orthopaedic Surgery | Admitting: Physical Therapy

## 2023-06-06 ENCOUNTER — Other Ambulatory Visit: Payer: Self-pay

## 2023-06-06 ENCOUNTER — Encounter (HOSPITAL_BASED_OUTPATIENT_CLINIC_OR_DEPARTMENT_OTHER): Payer: Self-pay | Admitting: Physical Therapy

## 2023-06-06 DIAGNOSIS — S73191A Other sprain of right hip, initial encounter: Secondary | ICD-10-CM | POA: Insufficient documentation

## 2023-06-06 DIAGNOSIS — M25551 Pain in right hip: Secondary | ICD-10-CM | POA: Insufficient documentation

## 2023-06-06 NOTE — Therapy (Signed)
OUTPATIENT PHYSICAL THERAPY EVALUATION   Patient Name: Lisa Duran MRN: [id number] DOB:09-04-1980, 42 y.o., female Today's Date: 06/06/2023  END OF SESSION:  PT End of Session - 06/06/23 0911     Visit Number 1    Number of Visits 3    Date for PT Re-Evaluation 07/06/23    PT Start Time 0830    PT Stop Time 0911    PT Time Calculation (min) 41 min    Activity Tolerance Patient tolerated treatment well    Behavior During Therapy [facility] for tasks assessed/performed             Past Medical History:  Diagnosis Date   ADHD (attention deficit hyperactivity disorder)    ALLERGIC RHINITIS    Allergy    Anemia    ANXIETY    Depression    DIABETES MELLITUS, TYPE II    Fibromyalgia 2018   GERD (gastroesophageal reflux disease)    MIGRAINE, COMMON    Obesity, unspecified    OVARIAN CYST    Past Surgical History:  Procedure Laterality Date   NO PAST SURGERIES     Patient Active Problem List   Diagnosis Date Noted   Cellulitis of abdominal wall 12/26/2022   B12 deficiency 05/09/2021   Dyslipidemia 04/14/2019   Enteritis 01/12/2019   Sleep walking disorder 04/06/2017   Iron deficiency anemia 03/27/2017   Fibromyalgia 02/03/2017   GERD (gastroesophageal reflux disease) 06/24/2015   Eczema 06/22/2015   Depression 06/22/2015   Tachycardia 06/16/2014   Food allergy    ADHD (attention deficit hyperactivity disorder) 01/27/2011   Anxiety 02/20/2010   Allergic rhinitis 11/21/2009   OVARIAN CYST 11/21/2009   Diabetes mellitus type 2 with neurological manifestations (HCC) 11/20/2009   Class 2 obesity in adult 11/20/2009   Migraine without aura 11/20/2009    REFERRING PROVIDER:  Huel Cote, MD     REFERRING DIAG: [phone] (ICD-10-CM) - Tear of right acetabular labrum, initial encounter  Hip strengthening prehab  Rationale for Evaluation and Treatment: Rehabilitation  THERAPY DIAG:  Pain in right hip  ONSET DATE: a few months, surgery date  TBD   SUBJECTIVE:                                                                                                                                                                                           SUBJECTIVE STATEMENT: Had left meniscus root repair done and feels like the pain in the right hip is causing issues with that now.   PERTINENT HISTORY:  Fibromyalgia, Iron deficiency  PAIN:  Are you having pain? Yes: NPRS scale: mild right now/10 Pain location: Rt hip  and left knee Pain description: ache Aggravating factors: activity, sitting too long Relieving factors: stretching and getting weight off of knee  PRECAUTIONS:  None  RED FLAGS: None   WEIGHT BEARING RESTRICTIONS:  No  FALLS:  Has patient fallen in last 6 months? No  OCCUPATION:  Print production planner, has to lift boxes of medications  PLOF:  Independent  PATIENT GOALS:  Get ready for surgery   OBJECTIVE:  Note: Objective measures were completed at Evaluation unless otherwise noted.  COGNITIVE STATUS: Within functional limits for tasks assessed   SENSATION: WFL  EDEMA:  No  POSTURE:  No Significant postural limitations    GAIT:  Comments: WFL      TREATMENT:                                                                                                                               DATE: 10/26 EVAL Gait with crutches  See HEP Discussed stability stools as option for time at work    PATIENT EDUCATION:  Education details: Teacher, music of condition, POC, HEP, exercise form/rationale Person educated: Patient Education method: Programmer, multimedia, Demonstration, Tactile cues, Verbal cues, and Handouts Education comprehension: verbalized understanding, returned demonstration, verbal cues required, and tactile cues required  HOME EXERCISE PROGRAM: [email or web address]   ASSESSMENT:  CLINICAL IMPRESSION: Patient is a 42 y.o. F who was seen today for physical therapy evaluation and treatment for right hip  pain and prehab exercises for labral repair. Overall she is doing very well and was able to feel activation pattern in exercises. Will work with HEP and f/u PRN prior to surgery, re-eval to be performed post op.      REHAB POTENTIAL: Good  CLINICAL DECISION MAKING: Stable/uncomplicated  EVALUATION COMPLEXITY: Low   GOALS: Goals reviewed with patient? Yes  SHORT TERM GOALS: Target date: 11/25  F/u if necessary for HEP changes Baseline: Goal status: INITIAL   PLAN:  PT FREQUENCY:  1-2 more visits in the next 30 days  PT DURATION: POC date  PLANNED INTERVENTIONS: 97164- PT Re-evaluation, 97110-Therapeutic exercises, 97530- Therapeutic activity, 97112- Neuromuscular re-education, 97535- Self Care, 62130- Manual therapy, and 97116- Gait training.  PLAN FOR NEXT SESSION: f/u PRN for HEP or re-eval post op   Lisa Duran C. Joelle Flessner PT, DPT 06/06/23 12:34 PM

## 2023-06-08 ENCOUNTER — Encounter: Payer: Self-pay | Admitting: Internal Medicine

## 2023-06-15 ENCOUNTER — Other Ambulatory Visit (HOSPITAL_COMMUNITY): Payer: Self-pay

## 2023-06-15 ENCOUNTER — Encounter: Payer: Self-pay | Admitting: Hematology

## 2023-06-26 ENCOUNTER — Telehealth: Payer: Self-pay | Admitting: Orthopaedic Surgery

## 2023-06-26 NOTE — Telephone Encounter (Signed)
Patient left a message requesting a call back to discuss how long she can expect to be out of work post op? Two weeks, four weeks? What is weight bearing status during that time? And will there be any other restrictions she will need to follow? Please call to discuss.

## 2023-06-29 NOTE — Telephone Encounter (Signed)
LMOM to call back

## 2023-07-07 ENCOUNTER — Other Ambulatory Visit: Payer: Self-pay | Admitting: Internal Medicine

## 2023-07-12 HISTORY — PX: HIP ARTHROSCOPY W/ LABRAL REPAIR: SHX1750

## 2023-07-15 ENCOUNTER — Ambulatory Visit (HOSPITAL_BASED_OUTPATIENT_CLINIC_OR_DEPARTMENT_OTHER): Payer: Self-pay | Admitting: Orthopaedic Surgery

## 2023-07-15 DIAGNOSIS — S73191A Other sprain of right hip, initial encounter: Secondary | ICD-10-CM

## 2023-07-15 NOTE — Pre-Procedure Instructions (Signed)
Surgical Instructions   Your procedure is scheduled on July 21, 2023. Report to [facility] Main Entrance "A" at 11:00 A.M., then check in with the Admitting office. Any questions or running late day of surgery: call [phone]  Questions prior to your surgery date: call [phone], Monday-Friday, 8am-4pm. If you experience any cold or flu symptoms such as cough, fever, chills, shortness of breath, etc. between now and your scheduled surgery, please notify us at the above number.     Remember:  Do not eat after midnight the night before your surgery   You may drink clear liquids until 10:00 AM the morning of your surgery.   Clear liquids allowed are: Water, Non-Citrus Juices (without pulp), Carbonated Beverages, Clear Tea (no milk, honey, etc.), Black Coffee Only (NO MILK, CREAM OR POWDERED CREAMER of any kind), and Gatorade.    Take these medicines the morning of surgery with A SIP OF WATER: Fexofenadine HCl (ALLEGRA ALLERGY PO)  metoprolol succinate (TOPROL-XL)  omeprazole (PRILOSEC)    May take these medicines IF NEEDED: ALPRAZolam (XANAX)  EPINEPHrine pen methocarbamol (ROBAXIN)  ondansetron (ZOFRAN ODT)  promethazine (PHENERGAN)  rizatriptan (MAXALT)    One week prior to surgery, STOP taking any Aspirin (unless otherwise instructed by your surgeon) Aleve, Naproxen, Ibuprofen, Motrin, Advil, Goody's, BC's, all herbal medications, fish oil, and non-prescription vitamins. This includes your medication: celecoxib (CELEBREX) and meloxicam (MOBIC)    WHAT DO I DO ABOUT MY DIABETES MEDICATION?   Do not take metFORMIN (GLUCOPHAGE) the morning of surgery.  STOP taking your OZEMPIC one week prior to surgery. DO NOT take any doses after December 2nd.   HOW TO MANAGE YOUR DIABETES BEFORE AND AFTER SURGERY  Why is it important to control my blood sugar before and after surgery? Improving blood sugar levels before and after surgery helps healing and can limit  problems. A way of improving blood sugar control is eating a healthy diet by:  Eating less sugar and carbohydrates  Increasing activity/exercise  Talking with your doctor about reaching your blood sugar goals High blood sugars (greater than 180 mg/dL) can raise your risk of infections and slow your recovery, so you will need to focus on controlling your diabetes during the weeks before surgery. Make sure that the doctor who takes care of your diabetes knows about your planned surgery including the date and location.  How do I manage my blood sugar before surgery? Check your blood sugar at least 4 times a day, starting 2 days before surgery, to make sure that the level is not too high or low.  Check your blood sugar the morning of your surgery when you wake up and every 2 hours until you get to the Short Stay unit.  If your blood sugar is less than 70 mg/dL, you will need to treat for low blood sugar: Do not take insulin. Treat a low blood sugar (less than 70 mg/dL) with  cup of clear juice (cranberry or apple), 4 glucose tablets, OR glucose gel. Recheck blood sugar in 15 minutes after treatment (to make sure it is greater than 70 mg/dL). If your blood sugar is not greater than 70 mg/dL on recheck, call [phone] for further instructions. Report your blood sugar to the short stay nurse when you get to Short Stay.  If you are admitted to the hospital after surgery: Your blood sugar will be checked by the staff and you will probably be given insulin after surgery (instead of oral diabetes medicines) to make sure  you have good blood sugar levels. The goal for blood sugar control after surgery is 80-180 mg/dL.                      Do NOT Smoke (Tobacco/Vaping) for 24 hours prior to your procedure.  If you use a CPAP at night, you may bring your mask/headgear for your overnight stay.   You will be asked to remove any contacts, glasses, piercing's, hearing aid's, dentures/partials prior to  surgery. Please bring cases for these items if needed.    Patients discharged the day of surgery will not be allowed to drive home, and someone needs to stay with them for 24 hours.  SURGICAL WAITING ROOM VISITATION Patients may have no more than 2 support people in the waiting area - these visitors may rotate.   Pre-op nurse will coordinate an appropriate time for 1 ADULT support person, who may not rotate, to accompany patient in pre-op.  Children under the age of 44 must have an adult with them who is not the patient and must remain in the main waiting area with an adult.  If the patient needs to stay at the hospital during part of their recovery, the visitor guidelines for inpatient rooms apply.  Please refer to the [facility] website for the visitor guidelines for any additional information.   If you received a COVID test during your pre-op visit  it is requested that you wear a mask when out in public, stay away from anyone that may not be feeling well and notify your surgeon if you develop symptoms. If you have been in contact with anyone that has tested positive in the last 10 days please notify you surgeon.      Pre-operative CHG Bathing Instructions   You can play a key role in reducing the risk of infection after surgery. Your skin needs to be as free of germs as possible. You can reduce the number of germs on your skin by washing with CHG (chlorhexidine gluconate) soap before surgery. CHG is an antiseptic soap that kills germs and continues to kill germs even after washing.   DO NOT use if you have an allergy to chlorhexidine/CHG or antibacterial soaps. If your skin becomes reddened or irritated, stop using the CHG and notify one of our RNs at [phone].              TAKE A SHOWER THE NIGHT BEFORE SURGERY AND THE DAY OF SURGERY    Please keep in mind the following:  DO NOT shave, including legs and underarms, 48 hours prior to surgery.   You may shave your face before/day  of surgery.  Place clean sheets on your bed the night before surgery Use a clean washcloth (not used since being washed) for each shower. DO NOT sleep with pet's night before surgery.  CHG Shower Instructions:  Wash your face and private area with normal soap. If you choose to wash your hair, wash first with your normal shampoo.  After you use shampoo/soap, rinse your hair and body thoroughly to remove shampoo/soap residue.  Turn the water OFF and apply half the bottle of CHG soap to a CLEAN washcloth.  Apply CHG soap ONLY FROM YOUR NECK DOWN TO YOUR TOES (washing for 3-5 minutes)  DO NOT use CHG soap on face, private areas, open wounds, or sores.  Pay special attention to the area where your surgery is being performed.  If you are having back surgery, having  someone wash your back for you may be helpful. Wait 2 minutes after CHG soap is applied, then you may rinse off the CHG soap.  Pat dry with a clean towel  Put on clean pajamas    Additional instructions for the day of surgery: DO NOT APPLY any lotions, deodorants, cologne, or perfumes.   Do not wear jewelry or makeup Do not wear nail polish, gel polish, artificial nails, or any other type of covering on natural nails (fingers and toes) Do not bring valuables to the hospital. [facility] is not responsible for valuables/personal belongings. Put on clean/comfortable clothes.  Please brush your teeth.  Ask your nurse before applying any prescription medications to the skin.

## 2023-07-16 ENCOUNTER — Ambulatory Visit: Payer: 59 | Admitting: Sports Medicine

## 2023-07-16 ENCOUNTER — Encounter (HOSPITAL_COMMUNITY)
Admission: RE | Admit: 2023-07-16 | Discharge: 2023-07-16 | Disposition: A | Discharge status: Home / Self Care | Payer: 59 | Source: Ambulatory Visit | Attending: Orthopaedic Surgery | Admitting: Orthopaedic Surgery

## 2023-07-16 ENCOUNTER — Other Ambulatory Visit: Payer: Self-pay

## 2023-07-16 ENCOUNTER — Encounter (HOSPITAL_COMMUNITY): Payer: Self-pay

## 2023-07-16 VITALS — BP 102/71 | HR 86 | Temp 97.8°F | Resp 18 | Ht 64.0 in | Wt 179.2 lb

## 2023-07-16 DIAGNOSIS — K219 Gastro-esophageal reflux disease without esophagitis: Secondary | ICD-10-CM | POA: Insufficient documentation

## 2023-07-16 DIAGNOSIS — G43909 Migraine, unspecified, not intractable, without status migrainosus: Secondary | ICD-10-CM | POA: Diagnosis not present

## 2023-07-16 DIAGNOSIS — E119 Type 2 diabetes mellitus without complications: Secondary | ICD-10-CM | POA: Diagnosis not present

## 2023-07-16 DIAGNOSIS — Z87891 Personal history of nicotine dependence: Secondary | ICD-10-CM | POA: Diagnosis not present

## 2023-07-16 DIAGNOSIS — D649 Anemia, unspecified: Secondary | ICD-10-CM | POA: Diagnosis not present

## 2023-07-16 DIAGNOSIS — G8918 Other acute postprocedural pain: Secondary | ICD-10-CM | POA: Insufficient documentation

## 2023-07-16 DIAGNOSIS — Z01812 Encounter for preprocedural laboratory examination: Secondary | ICD-10-CM | POA: Diagnosis present

## 2023-07-16 DIAGNOSIS — S73191A Other sprain of right hip, initial encounter: Secondary | ICD-10-CM | POA: Diagnosis not present

## 2023-07-16 DIAGNOSIS — X58XXXA Exposure to other specified factors, initial encounter: Secondary | ICD-10-CM | POA: Diagnosis not present

## 2023-07-16 DIAGNOSIS — F909 Attention-deficit hyperactivity disorder, unspecified type: Secondary | ICD-10-CM | POA: Diagnosis not present

## 2023-07-16 DIAGNOSIS — M797 Fibromyalgia: Secondary | ICD-10-CM | POA: Diagnosis not present

## 2023-07-16 LAB — COMPREHENSIVE METABOLIC PANEL
ALT: 12 U/L (ref 0–44)
AST: 16 U/L (ref 15–41)
Albumin: 3.9 g/dL (ref 3.5–5.0)
Alkaline Phosphatase: 75 U/L (ref 38–126)
Anion gap: 11 (ref 5–15)
BUN: 10 mg/dL (ref 6–20)
CO2: 25 mmol/L (ref 22–32)
Calcium: 9.6 mg/dL (ref 8.9–10.3)
Chloride: 101 mmol/L (ref 98–111)
Creatinine, Ser: 0.71 mg/dL (ref 0.44–1.00)
GFR, Estimated: >60 mL/min (ref >60)
Glucose, Bld: 120 mg/dL — ABNORMAL HIGH (ref 70–99)
Potassium: 4.1 mmol/L (ref 3.5–5.1)
Sodium: 137 mmol/L (ref 135–145)
Total Bilirubin: 0.7 mg/dL (ref <1.2)
Total Protein: 7.1 g/dL (ref 6.5–8.1)

## 2023-07-16 LAB — CBC
HCT: 43.2 % (ref 36.0–46.0)
Hemoglobin: 13.3 g/dL (ref 12.0–15.0)
MCH: 25.7 pg — ABNORMAL LOW (ref 26.0–34.0)
MCHC: 30.8 g/dL (ref 30.0–36.0)
MCV: 83.6 fL (ref 80.0–100.0)
Platelets: 337 10*3/uL (ref 150–400)
RBC: 5.17 MIL/uL — ABNORMAL HIGH (ref 3.87–5.11)
RDW: 14.6 % (ref 11.5–15.5)
WBC: 7.3 10*3/uL (ref 4.0–10.5)
nRBC: 0 % (ref 0.0–0.2)

## 2023-07-16 LAB — GLUCOSE, CAPILLARY: Glucose-Capillary: 109 mg/dL — ABNORMAL HIGH (ref 70–99)

## 2023-07-16 NOTE — Progress Notes (Signed)
PCP - Dr. Cheryll Cockayne Cardiologist - Dr. Arrie Aran  PPM/ICD - N/A Device Orders - N/A  Rep Notified - N/A  Chest x-ray - N/A EKG - 07/16/23 Stress Test - 01/29/21 ECHO - 01/28/21 Cardiac Cath - DENIES  Sleep Study - YES, NOT DIAGNOSED WITH ANY OSA CPAP - N/A  Fasting Blood Sugar - 100-120'S Checks Blood Sugar __1___ times a day  Last dose of GLP1 agonist-  07/10/23  GLP1 instructions: HOLD DOS WITH LAST DOSE ON 07/13/23  Blood Thinner Instructions: Y Aspirin Instructions:Y  ERAS Protcol - Y PRE-SURGERY Ensure or G2- Y  COVID TEST- N/A   Anesthesia review: YES, history of ST; new EKG, diabetic, prior history of waking up with propofol and not remembering for a short time after prior knee surgery.    Patient denies shortness of breath, fever, cough and chest pain at PAT appointment. Patient denies any respiratory issues at this time.    All instructions explained to the patient, with a verbal understanding of the material. Patient agrees to go over the instructions while at home for a better understanding. Patient also instructed to self quarantine after being tested for COVID-19. The opportunity to ask questions was provided.

## 2023-07-16 NOTE — Pre-Procedure Instructions (Signed)
Surgical Instructions   Your procedure is scheduled on July 21, 2023. Report to [facility] Main Entrance "A" at 11:00 A.M., then check in with the Admitting office. Any questions or running late day of surgery: call [phone]  Questions prior to your surgery date: call [phone], Monday-Friday, 8am-4pm. If you experience any cold or flu symptoms such as cough, fever, chills, shortness of breath, etc. between now and your scheduled surgery, please notify us at the above number.     Remember:  Do not eat after midnight the night before your surgery   You may drink clear liquids until 10:00 AM the morning of your surgery.   Clear liquids allowed are: Water, Non-Citrus Juices (without pulp), Carbonated Beverages, Clear Tea (no milk, honey, etc.), Black Coffee Only (NO MILK, CREAM OR POWDERED CREAMER of any kind), and Gatorade.    Take these medicines the morning of surgery with A SIP OF WATER:   metoprolol succinate (TOPROL-XL)  omeprazole (PRILOSEC)    May take these medicines IF NEEDED: ALPRAZolam (XANAX)  EPINEPHrine pen Fexofenadine HCl (ALLEGRA ALLERGY PO)  methocarbamol (ROBAXIN)  ondansetron (ZOFRAN ODT)  promethazine (PHENERGAN)  rizatriptan (MAXALT)    One week prior to surgery, STOP taking any Aspirin (unless otherwise instructed by your surgeon) Aleve, Naproxen, Ibuprofen, Motrin, Advil, Goody's, BC's, all herbal medications, fish oil, and non-prescription vitamins. This includes your medication: celecoxib (CELEBREX) and meloxicam (MOBIC)    WHAT DO I DO ABOUT MY DIABETES MEDICATION?   Do not take metFORMIN (GLUCOPHAGE) the morning of surgery.  STOP taking your OZEMPIC one week prior to surgery. DO NOT take any doses after December 2nd.   HOW TO MANAGE YOUR DIABETES BEFORE AND AFTER SURGERY  Why is it important to control my blood sugar before and after surgery? Improving blood sugar levels before and after surgery helps healing and can limit  problems. A way of improving blood sugar control is eating a healthy diet by:  Eating less sugar and carbohydrates  Increasing activity/exercise  Talking with your doctor about reaching your blood sugar goals High blood sugars (greater than 180 mg/dL) can raise your risk of infections and slow your recovery, so you will need to focus on controlling your diabetes during the weeks before surgery. Make sure that the doctor who takes care of your diabetes knows about your planned surgery including the date and location.  How do I manage my blood sugar before surgery? Check your blood sugar at least 4 times a day, starting 2 days before surgery, to make sure that the level is not too high or low.  Check your blood sugar the morning of your surgery when you wake up and every 2 hours until you get to the Short Stay unit.  If your blood sugar is less than 70 mg/dL, you will need to treat for low blood sugar: Do not take insulin. Treat a low blood sugar (less than 70 mg/dL) with  cup of clear juice (cranberry or apple), 4 glucose tablets, OR glucose gel. Recheck blood sugar in 15 minutes after treatment (to make sure it is greater than 70 mg/dL). If your blood sugar is not greater than 70 mg/dL on recheck, call [phone] for further instructions. Report your blood sugar to the short stay nurse when you get to Short Stay.  If you are admitted to the hospital after surgery: Your blood sugar will be checked by the staff and you will probably be given insulin after surgery (instead of oral diabetes medicines) to  make sure you have good blood sugar levels. The goal for blood sugar control after surgery is 80-180 mg/dL.                      Do NOT Smoke (Tobacco/Vaping) for 24 hours prior to your procedure.  If you use a CPAP at night, you may bring your mask/headgear for your overnight stay.   You will be asked to remove any contacts, glasses, piercing's, hearing aid's, dentures/partials prior to  surgery. Please bring cases for these items if needed.    Patients discharged the day of surgery will not be allowed to drive home, and someone needs to stay with them for 24 hours.  SURGICAL WAITING ROOM VISITATION Patients may have no more than 2 support people in the waiting area - these visitors may rotate.   Pre-op nurse will coordinate an appropriate time for 1 ADULT support person, who may not rotate, to accompany patient in pre-op.  Children under the age of 32 must have an adult with them who is not the patient and must remain in the main waiting area with an adult.  If the patient needs to stay at the hospital during part of their recovery, the visitor guidelines for inpatient rooms apply.  Please refer to the [facility] website for the visitor guidelines for any additional information.   If you received a COVID test during your pre-op visit  it is requested that you wear a mask when out in public, stay away from anyone that may not be feeling well and notify your surgeon if you develop symptoms. If you have been in contact with anyone that has tested positive in the last 10 days please notify you surgeon.      Pre-operative CHG Bathing Instructions   You can play a key role in reducing the risk of infection after surgery. Your skin needs to be as free of germs as possible. You can reduce the number of germs on your skin by washing with CHG (chlorhexidine gluconate) soap before surgery. CHG is an antiseptic soap that kills germs and continues to kill germs even after washing.   DO NOT use if you have an allergy to chlorhexidine/CHG or antibacterial soaps. If your skin becomes reddened or irritated, stop using the CHG and notify one of our RNs at [phone].              TAKE A SHOWER THE NIGHT BEFORE SURGERY AND THE DAY OF SURGERY    Please keep in mind the following:  DO NOT shave, including legs and underarms, 48 hours prior to surgery.   You may shave your face before/day  of surgery.  Place clean sheets on your bed the night before surgery Use a clean washcloth (not used since being washed) for each shower. DO NOT sleep with pet's night before surgery.  CHG Shower Instructions:  Wash your face and private area with normal soap. If you choose to wash your hair, wash first with your normal shampoo.  After you use shampoo/soap, rinse your hair and body thoroughly to remove shampoo/soap residue.  Turn the water OFF and apply half the bottle of CHG soap to a CLEAN washcloth.  Apply CHG soap ONLY FROM YOUR NECK DOWN TO YOUR TOES (washing for 3-5 minutes)  DO NOT use CHG soap on face, private areas, open wounds, or sores.  Pay special attention to the area where your surgery is being performed.  If you are having back  surgery, having someone wash your back for you may be helpful. Wait 2 minutes after CHG soap is applied, then you may rinse off the CHG soap.  Pat dry with a clean towel  Put on clean pajamas    Additional instructions for the day of surgery: DO NOT APPLY any lotions, deodorants, cologne, or perfumes.   Do not wear jewelry or makeup Do not wear nail polish, gel polish, artificial nails, or any other type of covering on natural nails (fingers and toes) Do not bring valuables to the hospital. [facility] is not responsible for valuables/personal belongings. Put on clean/comfortable clothes.  Please brush your teeth.  Ask your nurse before applying any prescription medications to the skin.

## 2023-07-17 ENCOUNTER — Encounter (HOSPITAL_COMMUNITY): Payer: Self-pay

## 2023-07-17 NOTE — Progress Notes (Signed)
Anesthesia Chart Review:  Case: [id number] Date/Time: 07/21/23 1245   Procedure: RIGHT Arthroscopy Hip with Labral Repair (Right)   Anesthesia type: General   Pre-op diagnosis: RIGHT HIP LABRAL TEAR   Location: [facility] ROOM 05 / [facility]   Surgeons: Huel Cote, MD       DISCUSSION: Patient is a 42 year old female scheduled for the above procedure.  History includes former smoker (quit 08/20/02), DM2, GERD, fibromyalgia, migraines, ADHD, anemia.  She had cardiology evaluation by Dr. Clifton James initially on 12/19/20 for tachycardia, dyspnea with occasional chest pressure. TSH was normal and had not noted much change when Adderall held for a month.  Some improvement in dyspnea after an iron infusion. He ordered long term monitor, echo, and exercise myocardial stress test. May 2022 monitor was considered overall normal with SR and very rare PVCs/PACs. 01/28/21 echo showed LVEF 65 to 70%, no regional wall motion abnormalities, normal RV systolic function, mild aortic valve calcification and sclerosis but no evidence of aortic stenosis or aortic regurgitation. 01/29/21 stress test was normal, low risk, EF 72%.  Continue Toprol recommended with follow-up around 2 years.   A1c 6.7% on 05/22/23. She has a [employer] 3 sensor. Meds include metformin, Ozemic. Last Ozempic and ASA are scheduled for 07/13/23.  Anesthesia team to evaluate on the day of surgery. She reported reported "prior history of waking up with propofol and not remembering for a short time after prior knee surgery."     VS: BP 102/71   Pulse 86   Temp 36.6 C   Resp 18   Ht 5\' 4"  (1.626 m)   Wt 81.3 kg   LMP  (LMP Unknown) Comment: pt stated may have had a cycle in early 2023  SpO2 99%   BMI 30.76 kg/m    PROVIDERS: Pincus Sanes, MD is PCP  Verne Carrow, MD is cardiologist Carlus Pavlov, MD is endocrinologist   LABS: Labs reviewed: Acceptable for surgery. (all labs ordered are listed, but only abnormal  results are displayed)  Labs Reviewed  GLUCOSE, CAPILLARY - Abnormal; Notable for the following components:      Result Value   Glucose-Capillary 109 (*)    All other components within normal limits  COMPREHENSIVE METABOLIC PANEL - Abnormal; Notable for the following components:   Glucose, Bld 120 (*)    All other components within normal limits  CBC - Abnormal; Notable for the following components:   RBC 5.17 (*)    MCH 25.7 (*)    All other components within normal limits     IMAGES: MRI Right Hip 05/14/23: IMPRESSION: 1. Small focal tear of the anterosuperior labrum of the right hip. 2. Mild tendinosis of the bilateral gluteus minimus tendons. Trace right peritrochanteric bursal fluid. 3. Tendinosis of the bilateral hamstring tendon origins, left greater than right. 4. Mild edema within the right quadratus femoris muscle in the ischiofemoral space, although no anatomic narrowing of the space. Findings are nonspecific but can be seen in the setting of ischiofemoral impingement.    EKG: 07/16/23: Sinus rhythm at 82 bpm with short PR (PR 104 ms) Low voltage QRS Borderline ECG - Previous tracing on 12/19/20 showed ST at 115 bpm, PR 136 ms.   CV: Exercise Myocardial Perfusion Study 01/29/21: Fair exercise capacity, achieved 7 METS Normal BP response to exercise Upsloping ST segment depression was noted during stress. No EKG evidence of ischemia The left ventricular ejection fraction is hyperdynamic (>65%). Nuclear stress EF: 72%. The study is normal. This  is a low risk study.    Echo 01/28/21: IMPRESSIONS   1. Left ventricular ejection fraction, by estimation, is 65 to 70%. The  left ventricle has normal function. The left ventricle has no regional  wall motion abnormalities. Left ventricular diastolic parameters were  normal. The average left ventricular  global longitudinal strain is -20.9 %. The global longitudinal strain is  normal.   2. Right ventricular  systolic function is normal. The right ventricular  size is normal.   3. The mitral valve is normal in structure. No evidence of mitral valve  regurgitation. No evidence of mitral stenosis.   4. The aortic valve is tricuspid. There is mild calcification of the  aortic valve. Aortic valve regurgitation is not visualized. Mild aortic  valve sclerosis is present, with no evidence of aortic valve stenosis.   5. The inferior vena cava is normal in size with greater than 50%  respiratory variability, suggesting right atrial pressure of 3 mmHg.  - Comparison(s): No prior Echocardiogram.  - Conclusion(s)/Recommendation(s): Normal biventricular function without  evidence of hemodynamically significant valvular heart disease.    Long term monitor 12/22/20 - 01/05/21: Sinus rhythm ( Patient had a min HR of 58 bpm, max HR of 165 bpm, and avg HR of 95 bpm) Rare premature atrial contractions Rare premature ventricular contractions.   Past Medical History:  Diagnosis Date   ADHD (attention deficit hyperactivity disorder)    ALLERGIC RHINITIS    Allergy    Anemia    ANXIETY    Depression    DIABETES MELLITUS, TYPE II    Fibromyalgia 2018   GERD (gastroesophageal reflux disease)    MIGRAINE, COMMON    Obesity, unspecified    OVARIAN CYST     Past Surgical History:  Procedure Laterality Date   KNEE ARTHROSCOPY Left 08/25/2022    MEDICATIONS:  ALPRAZolam (XANAX) 0.5 MG tablet   amphetamine-dextroamphetamine (ADDERALL) 20 MG tablet   Continuous Glucose Sensor (FREESTYLE LIBRE 3 SENSOR) MISC   cyanocobalamin (VITAMIN B12) 1000 MCG/ML injection   EPINEPHrine 0.3 mg/0.3 mL IJ SOAJ injection   Fexofenadine HCl (ALLEGRA ALLERGY PO)   glucose blood (ONETOUCH VERIO) test strip   Insulin Syringe-Needle U-100 (B-D INS SYR ULTRAFINE .5CC/30G) 30G X 1/2" 0.5 ML MISC   levonorgestrel (MIRENA) 20 MCG/24HR IUD   metFORMIN (GLUCOPHAGE) 1000 MG tablet   metoprolol succinate (TOPROL-XL) 25 MG 24 hr  tablet   omeprazole (PRILOSEC) 40 MG capsule   ondansetron (ZOFRAN ODT) 4 MG disintegrating tablet   OZEMPIC, 0.25 OR 0.5 MG/DOSE, 2 MG/3ML SOPN   promethazine (PHENERGAN) 25 MG tablet   rizatriptan (MAXALT) 10 MG tablet   rosuvastatin (CRESTOR) 5 MG tablet   SYRINGE-NEEDLE, DISP, 3 ML 25G X 1" 3 ML MISC   triamcinolone cream (KENALOG) 0.1 %   No current facility-administered medications for this encounter.    Shonna Chock, PA-C Surgical Short Stay/Anesthesiology [facility] Phone [phone] [facility] Phone [phone] 07/17/2023 2:42 PM

## 2023-07-17 NOTE — Anesthesia Preprocedure Evaluation (Signed)
Anesthesia Evaluation  Patient identified by MRN, date of birth, ID band Patient awake    Reviewed: Allergy & Precautions, NPO status , Patient's Chart, lab work & pertinent test results, reviewed documented beta blocker date and time   Airway Mallampati: I  TM Distance: >3 FB Neck ROM: Full    Dental no notable dental hx. (+) Teeth Intact, Dental Advisory Given   Pulmonary former smoker   Pulmonary exam normal breath sounds clear to auscultation       Cardiovascular Normal cardiovascular exam Rhythm:Regular Rate:Normal  TTE 2022  1. Left ventricular ejection fraction, by estimation, is 65 to 70%. The  left ventricle has normal function. The left ventricle has no regional  wall motion abnormalities. Left ventricular diastolic parameters were  normal. The average left ventricular  global longitudinal strain is -20.9 %. The global longitudinal strain is  normal.   2. Right ventricular systolic function is normal. The right ventricular  size is normal.   3. The mitral valve is normal in structure. No evidence of mitral valve  regurgitation. No evidence of mitral stenosis.   4. The aortic valve is tricuspid. There is mild calcification of the  aortic valve. Aortic valve regurgitation is not visualized. Mild aortic  valve sclerosis is present, with no evidence of aortic valve stenosis.   5. The inferior vena cava is normal in size with greater than 50%  respiratory variability, suggesting right atrial pressure of 3 mmHg.   Stress Test 2022  Fair exercise capacity, achieved 7 METS  Normal BP response to exercise  Upsloping ST segment depression was noted during stress. No EKG evidence of ischemia  The left ventricular ejection fraction is hyperdynamic (>65%).  Nuclear stress EF: 72%.  The study is normal.  This is a low risk study.     Neuro/Psych  Headaches PSYCHIATRIC DISORDERS Anxiety Depression        GI/Hepatic Neg liver ROS,GERD  ,,  Endo/Other  diabetes, Type 2, Insulin Dependent    Renal/GU negative Renal ROS  negative genitourinary   Musculoskeletal  (+)  Fibromyalgia -  Abdominal   Peds  (+) ADHD Hematology negative hematology ROS (+)   Anesthesia Other Findings Allergy to propofol. Pt states that she became short of breath and wheezing after and EGD in 2017. She was told it was due to the propofol. Since then, she has received propofol for a knee arthroscopy and had no issues. She is amenable to receiving propofol today.   Reproductive/Obstetrics                             Anesthesia Physical Anesthesia Plan  ASA: 3  Anesthesia Plan: General   Post-op Pain Management: Tylenol PO (pre-op)*   Induction: Intravenous  PONV Risk Score and Plan: 3 and Midazolam, Dexamethasone and Ondansetron  Airway Management Planned: Oral ETT  Additional Equipment:   Intra-op Plan:   Post-operative Plan: Extubation in OR  Informed Consent: I have reviewed the patients History and Physical, chart, labs and discussed the procedure including the risks, benefits and alternatives for the proposed anesthesia with the patient or authorized representative who has indicated his/her understanding and acceptance.     Dental advisory given  Plan Discussed with: CRNA  Anesthesia Plan Comments: (PAT note written 07/17/2023 by Shonna Chock, PA-C.  )       Anesthesia Quick Evaluation

## 2023-07-21 ENCOUNTER — Ambulatory Visit (HOSPITAL_COMMUNITY)
Admission: RE | Admit: 2023-07-21 | Discharge: 2023-07-21 | Disposition: A | Discharge status: Home / Self Care | Payer: 59 | Attending: Orthopaedic Surgery | Admitting: Orthopaedic Surgery

## 2023-07-21 ENCOUNTER — Ambulatory Visit (HOSPITAL_COMMUNITY): Payer: 59

## 2023-07-21 ENCOUNTER — Ambulatory Visit (HOSPITAL_COMMUNITY): Payer: 59 | Admitting: Vascular Surgery

## 2023-07-21 ENCOUNTER — Other Ambulatory Visit: Payer: Self-pay

## 2023-07-21 ENCOUNTER — Encounter (HOSPITAL_COMMUNITY)
Admission: RE | Disposition: A | Discharge status: Home / Self Care | Payer: Self-pay | Source: Home / Self Care | Attending: Orthopaedic Surgery

## 2023-07-21 ENCOUNTER — Ambulatory Visit (HOSPITAL_BASED_OUTPATIENT_CLINIC_OR_DEPARTMENT_OTHER): Payer: 59 | Admitting: Anesthesiology

## 2023-07-21 ENCOUNTER — Encounter (HOSPITAL_COMMUNITY): Payer: Self-pay | Admitting: Orthopaedic Surgery

## 2023-07-21 DIAGNOSIS — E119 Type 2 diabetes mellitus without complications: Secondary | ICD-10-CM | POA: Insufficient documentation

## 2023-07-21 DIAGNOSIS — K219 Gastro-esophageal reflux disease without esophagitis: Secondary | ICD-10-CM | POA: Diagnosis not present

## 2023-07-21 DIAGNOSIS — Z794 Long term (current) use of insulin: Secondary | ICD-10-CM | POA: Insufficient documentation

## 2023-07-21 DIAGNOSIS — G8918 Other acute postprocedural pain: Secondary | ICD-10-CM

## 2023-07-21 DIAGNOSIS — S73191A Other sprain of right hip, initial encounter: Secondary | ICD-10-CM

## 2023-07-21 DIAGNOSIS — Z7984 Long term (current) use of oral hypoglycemic drugs: Secondary | ICD-10-CM | POA: Insufficient documentation

## 2023-07-21 DIAGNOSIS — Z87891 Personal history of nicotine dependence: Secondary | ICD-10-CM | POA: Insufficient documentation

## 2023-07-21 DIAGNOSIS — M25351 Other instability, right hip: Secondary | ICD-10-CM | POA: Insufficient documentation

## 2023-07-21 DIAGNOSIS — F419 Anxiety disorder, unspecified: Secondary | ICD-10-CM | POA: Diagnosis not present

## 2023-07-21 LAB — POCT PREGNANCY, URINE: Preg Test, Ur: NEGATIVE

## 2023-07-21 LAB — GLUCOSE, CAPILLARY
Glucose-Capillary: 102 mg/dL — ABNORMAL HIGH (ref 70–99)
Glucose-Capillary: 98 mg/dL (ref 70–99)
Glucose-Capillary: 87 mg/dL (ref 70–99)
Glucose-Capillary: 94 mg/dL (ref 70–99)

## 2023-07-21 SURGERY — ARTHROSCOPY, HIP, WITH LABRUM REPAIR
Anesthesia: General | Site: Hip | Laterality: Right

## 2023-07-21 MED ORDER — DEXAMETHASONE SODIUM PHOSPHATE 10 MG/ML IJ SOLN
INTRAMUSCULAR | Status: DC | PRN
  Administered 2023-07-21: 10 mg via INTRAVENOUS

## 2023-07-21 MED ORDER — GABAPENTIN 300 MG PO CAPS: 300.0000 mg | ORAL_CAPSULE | Freq: Once | ORAL | Status: AC

## 2023-07-21 MED ORDER — FENTANYL CITRATE (PF) 250 MCG/5ML IJ SOLN
INTRAMUSCULAR | Status: AC
  Filled 2023-07-21: qty 5

## 2023-07-21 MED ORDER — LIDOCAINE 2% (20 MG/ML) 5 ML SYRINGE
INTRAMUSCULAR | Status: AC
  Filled 2023-07-21: qty 5

## 2023-07-21 MED ORDER — BUPIVACAINE HCL (PF) 0.25 % IJ SOLN
INTRAMUSCULAR | Status: AC
  Filled 2023-07-21: qty 30

## 2023-07-21 MED ORDER — BUPIVACAINE-EPINEPHRINE (PF) 0.25% -1:200000 IJ SOLN
INTRAMUSCULAR | Status: AC
  Filled 2023-07-21: qty 30

## 2023-07-21 MED ORDER — FENTANYL CITRATE (PF) 100 MCG/2ML IJ SOLN
25.0000 ug | INTRAMUSCULAR | Status: DC | PRN
  Administered 2023-07-21 (×2): 50 ug via INTRAVENOUS

## 2023-07-21 MED ORDER — ROCURONIUM BROMIDE 10 MG/ML (PF) SYRINGE
PREFILLED_SYRINGE | INTRAVENOUS | Status: DC | PRN
  Administered 2023-07-21: 50 mg via INTRAVENOUS

## 2023-07-21 MED ORDER — HYDROMORPHONE HCL 1 MG/ML IJ SOLN
INTRAMUSCULAR | Status: AC
  Filled 2023-07-21: qty 1

## 2023-07-21 MED ORDER — EPINEPHRINE PF 1 MG/ML IJ SOLN
INTRAMUSCULAR | Status: AC
Start: 2023-07-21 — End: ?
  Filled 2023-07-21: qty 2

## 2023-07-21 MED ORDER — LIDOCAINE 2% (20 MG/ML) 5 ML SYRINGE
INTRAMUSCULAR | Status: DC | PRN
  Administered 2023-07-21: 60 mg via INTRAVENOUS

## 2023-07-21 MED ORDER — CEFAZOLIN SODIUM-DEXTROSE 2-4 GM/100ML-% IV SOLN
2.0000 g | INTRAVENOUS | Status: AC
  Administered 2023-07-21: 2 g via INTRAVENOUS

## 2023-07-21 MED ORDER — ASPIRIN 325 MG PO TBEC: 325.0000 mg | DELAYED_RELEASE_TABLET | Freq: Every day | ORAL | 0 refills | Status: DC

## 2023-07-21 MED ORDER — SODIUM CHLORIDE (PF) 0.9 % IJ SOLN: INTRAMUSCULAR | Status: DC | PRN

## 2023-07-21 MED ORDER — GABAPENTIN 300 MG PO CAPS
ORAL_CAPSULE | ORAL | Status: AC
  Administered 2023-07-21: 300 mg via ORAL
  Filled 2023-07-21: qty 1

## 2023-07-21 MED ORDER — ACETAMINOPHEN 500 MG PO TABS
ORAL_TABLET | ORAL | Status: AC
  Administered 2023-07-21: 1000 mg via ORAL
  Filled 2023-07-21: qty 2

## 2023-07-21 MED ORDER — MIDAZOLAM HCL 2 MG/2ML IJ SOLN
INTRAMUSCULAR | Status: AC
  Filled 2023-07-21: qty 2

## 2023-07-21 MED ORDER — PHENYLEPHRINE 80 MCG/ML (10ML) SYRINGE FOR IV PUSH (FOR BLOOD PRESSURE SUPPORT)
PREFILLED_SYRINGE | INTRAVENOUS | Status: DC | PRN
  Administered 2023-07-21: 240 ug via INTRAVENOUS

## 2023-07-21 MED ORDER — HYDROMORPHONE HCL 1 MG/ML IJ SOLN
0.2500 mg | INTRAMUSCULAR | Status: DC | PRN
  Administered 2023-07-21 (×2): 0.5 mg via INTRAVENOUS

## 2023-07-21 MED ORDER — KETOROLAC TROMETHAMINE 30 MG/ML IJ SOLN
INTRAMUSCULAR | Status: DC | PRN
  Administered 2023-07-21: 30 mg via INTRAVENOUS

## 2023-07-21 MED ORDER — LACTATED RINGERS IV SOLN: INTRAVENOUS | Status: DC

## 2023-07-21 MED ORDER — ONDANSETRON HCL 4 MG/2ML IJ SOLN
INTRAMUSCULAR | Status: AC
  Filled 2023-07-21: qty 2

## 2023-07-21 MED ORDER — PROPOFOL 10 MG/ML IV BOLUS
INTRAVENOUS | Status: AC
  Filled 2023-07-21: qty 20

## 2023-07-21 MED ORDER — ACETAMINOPHEN 500 MG PO TABS: 1000.0000 mg | ORAL_TABLET | Freq: Once | ORAL | Status: AC

## 2023-07-21 MED ORDER — FENTANYL CITRATE (PF) 100 MCG/2ML IJ SOLN
INTRAMUSCULAR | Status: AC
  Filled 2023-07-21: qty 2

## 2023-07-21 MED ORDER — PHENYLEPHRINE 80 MCG/ML (10ML) SYRINGE FOR IV PUSH (FOR BLOOD PRESSURE SUPPORT)
PREFILLED_SYRINGE | INTRAVENOUS | Status: AC
  Filled 2023-07-21: qty 10

## 2023-07-21 MED ORDER — 0.9 % SODIUM CHLORIDE (POUR BTL) OPTIME
TOPICAL | Status: DC | PRN
  Administered 2023-07-21: 1000 mL

## 2023-07-21 MED ORDER — CEFAZOLIN SODIUM-DEXTROSE 2-4 GM/100ML-% IV SOLN
INTRAVENOUS | Status: AC
  Filled 2023-07-21: qty 100

## 2023-07-21 MED ORDER — FENTANYL CITRATE (PF) 250 MCG/5ML IJ SOLN
INTRAMUSCULAR | Status: DC | PRN
  Administered 2023-07-21: 100 ug via INTRAVENOUS
  Administered 2023-07-21: 50 ug via INTRAVENOUS

## 2023-07-21 MED ORDER — CHLORHEXIDINE GLUCONATE 0.12 % MT SOLN: 15.0000 mL | Freq: Once | OROMUCOSAL | Status: AC

## 2023-07-21 MED ORDER — OXYCODONE HCL 5 MG PO TABS
5.0000 mg | ORAL_TABLET | Freq: Once | ORAL | Status: AC
  Administered 2023-07-21: 5 mg via ORAL

## 2023-07-21 MED ORDER — IBUPROFEN 800 MG PO TABS: 800.0000 mg | ORAL_TABLET | Freq: Three times a day (TID) | ORAL | 0 refills | Status: DC

## 2023-07-21 MED ORDER — BUPIVACAINE HCL 0.25 % IJ SOLN
INTRAMUSCULAR | Status: DC | PRN
  Administered 2023-07-21: 20 mL

## 2023-07-21 MED ORDER — SODIUM CHLORIDE 0.9 % IV SOLN: INTRAVENOUS | Status: DC

## 2023-07-21 MED ORDER — TRANEXAMIC ACID-NACL 1000-0.7 MG/100ML-% IV SOLN
1000.0000 mg | INTRAVENOUS | Status: AC
  Administered 2023-07-21: 1000 mg via INTRAVENOUS
  Filled 2023-07-21: qty 100

## 2023-07-21 MED ORDER — SUGAMMADEX SODIUM 200 MG/2ML IV SOLN
INTRAVENOUS | Status: DC | PRN
  Administered 2023-07-21: 200 mg via INTRAVENOUS

## 2023-07-21 MED ORDER — PROPOFOL 10 MG/ML IV BOLUS
INTRAVENOUS | Status: DC | PRN
  Administered 2023-07-21: 130 mg via INTRAVENOUS

## 2023-07-21 MED ORDER — ORAL CARE MOUTH RINSE: 15.0000 mL | Freq: Once | OROMUCOSAL | Status: AC

## 2023-07-21 MED ORDER — ROCURONIUM BROMIDE 10 MG/ML (PF) SYRINGE
PREFILLED_SYRINGE | INTRAVENOUS | Status: AC
  Filled 2023-07-21: qty 10

## 2023-07-21 MED ORDER — DIPHENHYDRAMINE HCL 50 MG/ML IJ SOLN
INTRAMUSCULAR | Status: DC | PRN
  Administered 2023-07-21: 12.5 mg via INTRAVENOUS

## 2023-07-21 MED ORDER — MIDAZOLAM HCL 2 MG/2ML IJ SOLN
INTRAMUSCULAR | Status: DC | PRN
  Administered 2023-07-21: 2 mg via INTRAVENOUS

## 2023-07-21 MED ORDER — KETOROLAC TROMETHAMINE 30 MG/ML IJ SOLN
INTRAMUSCULAR | Status: AC
  Filled 2023-07-21: qty 1

## 2023-07-21 MED ORDER — DEXAMETHASONE SODIUM PHOSPHATE 10 MG/ML IJ SOLN
INTRAMUSCULAR | Status: AC
  Filled 2023-07-21: qty 1

## 2023-07-21 MED ORDER — ACETAMINOPHEN 500 MG PO TABS: 500.0000 mg | ORAL_TABLET | Freq: Three times a day (TID) | ORAL | 0 refills | Status: AC

## 2023-07-21 MED ORDER — OXYCODONE HCL 5 MG PO TABS: 5.0000 mg | ORAL_TABLET | ORAL | 0 refills | Status: DC | PRN

## 2023-07-21 MED ORDER — ONDANSETRON HCL 4 MG/2ML IJ SOLN
INTRAMUSCULAR | Status: DC | PRN
  Administered 2023-07-21: 4 mg via INTRAVENOUS

## 2023-07-21 MED ORDER — PROPOFOL 500 MG/50ML IV EMUL
INTRAVENOUS | Status: DC | PRN
  Administered 2023-07-21: 25 ug/kg/min via INTRAVENOUS

## 2023-07-21 MED ORDER — EPHEDRINE 5 MG/ML INJ
INTRAVENOUS | Status: AC
  Filled 2023-07-21: qty 5

## 2023-07-21 MED ORDER — CHLORHEXIDINE GLUCONATE 0.12 % MT SOLN
OROMUCOSAL | Status: AC
  Administered 2023-07-21: 15 mL via OROMUCOSAL
  Filled 2023-07-21: qty 15

## 2023-07-21 MED ORDER — OXYCODONE HCL 5 MG PO TABS
ORAL_TABLET | ORAL | Status: AC
  Filled 2023-07-21: qty 1

## 2023-07-21 SURGICAL SUPPLY — 54 items
ANCHOR SUT 1.4 FLEX (Anchor) IMPLANT
BIT DRILL FLEX NANOTACK (BIT) IMPLANT
BLADE SAMURAI STR FULL RADIUS (BLADE) IMPLANT
BLADE SURG 11 STRL SS (BLADE) ×1 IMPLANT
CANNULA OBTURATOR FLOWPORT ST5 (CANNULA) IMPLANT
CHLORAPREP W/TINT 26 (MISCELLANEOUS) ×1 IMPLANT
COOLER ICEMAN CLASSIC (MISCELLANEOUS) IMPLANT
COVER SURGICAL LIGHT HANDLE (MISCELLANEOUS) IMPLANT
DISSECTOR 4.2MMX19CM HL (MISCELLANEOUS) ×1 IMPLANT
DRAPE C-ARM 42X72 X-RAY (DRAPES) ×1 IMPLANT
DRAPE STERI IOBAN 125X83 (DRAPES) ×1 IMPLANT
DRAPE U-SHAPE 47X51 STRL (DRAPES) ×2 IMPLANT
DRSG TEGADERM 4X4.75 (GAUZE/BANDAGES/DRESSINGS) ×2 IMPLANT
FEE RENTAL EQUIP HIP INSTR KIT (INSTRUMENTS) IMPLANT
GAUZE SPONGE 4X4 12PLY STRL (GAUZE/BANDAGES/DRESSINGS) ×1 IMPLANT
GAUZE SPONGE 4X4 16PLY XRAY LF (GAUZE/BANDAGES/DRESSINGS) IMPLANT
GAUZE XEROFORM 5X9 LF (GAUZE/BANDAGES/DRESSINGS) IMPLANT
GLOVE BIO SURGEON STRL SZ7.5 (GLOVE) ×1 IMPLANT
GLOVE BIOGEL PI IND STRL 6.5 (GLOVE) ×1 IMPLANT
GLOVE BIOGEL PI IND STRL 8 (GLOVE) ×1 IMPLANT
GLOVE ECLIPSE 6.0 STRL STRAW (GLOVE) ×1 IMPLANT
GOWN STRL REUS W/ TWL LRG LVL3 (GOWN DISPOSABLE) ×2 IMPLANT
GOWN STRL REUS W/ TWL XL LVL3 (GOWN DISPOSABLE) ×1 IMPLANT
INSTRUMENT ORTHO TEXT HIP FEM (INSTRUMENTS) IMPLANT
KIT BASIN OR (CUSTOM PROCEDURE TRAY) ×1 IMPLANT
KIT HIP ARTHROSCOPY (ORTHOPEDIC DISPOSABLE SUPPLIES) ×1 IMPLANT
KIT PATIENT POSITION LRG (KITS) IMPLANT
KIT PORTAL ENTRY HIP ACCESS (KITS) IMPLANT
KIT TURNOVER KIT B (KITS) ×1 IMPLANT
MANIFOLD NEPTUNE II (INSTRUMENTS) ×2 IMPLANT
NDL INJECTOR II CARTRIDGE (MISCELLANEOUS) IMPLANT
NDL SPNL 18GX3.5 QUINCKE PK (NEEDLE) ×1 IMPLANT
NEEDLE INJECTOR II CARTRIDGE (MISCELLANEOUS) ×1 IMPLANT
NEEDLE SPNL 18GX3.5 QUINCKE PK (NEEDLE) ×1 IMPLANT
PACK SRG BSC III STRL LF ECLPS (CUSTOM PROCEDURE TRAY) ×1 IMPLANT
PAD ARMBOARD 7.5X6 YLW CONV (MISCELLANEOUS) ×2 IMPLANT
PAD COLD SHLDR WRAP-ON (PAD) IMPLANT
PASSER SUT 1.5D CRESCENT (INSTRUMENTS) IMPLANT
PASSER SUT 70D UP ANGLED (INSTRUMENTS) IMPLANT
SPIKE FLUID TRANSFER (MISCELLANEOUS) ×1 IMPLANT
SUT ETHILON 3 0 FSL (SUTURE) IMPLANT
SUT FIBERWIRE #2 38 T-5 BLUE (SUTURE)
SUT XBRAID 1.4 BLUE (SUTURE) IMPLANT
SUTURE FIBERWR #2 38 T-5 BLUE (SUTURE) IMPLANT
SUTURE TAPE XBRAID 1.2 BLUE 45 (SUTURE) IMPLANT
SUTURETAPE XBRAID 1.2 BLUE 45 (SUTURE) ×1
SYR 50ML LL SCALE MARK (SYRINGE) ×1 IMPLANT
TOWEL GREEN STERILE (TOWEL DISPOSABLE) ×1 IMPLANT
TRAY ARTHROSCOPY HIP STRE FEE (INSTRUMENTS) ×1 IMPLANT
TRAY PIVOT PORT STRE FEE (INSTRUMENTS) ×1 IMPLANT
TUBE CONNECTING 12X1/4 (SUCTIONS) ×2 IMPLANT
TUBING ARTHROSCOPY IRRIG 16FT (MISCELLANEOUS) ×1 IMPLANT
WAND APOLLO RF 50D ABLATOR (BUR) IMPLANT
WATER STERILE IRR 1000ML POUR (IV SOLUTION) ×1 IMPLANT

## 2023-07-21 NOTE — Op Note (Signed)
Date of Surgery: 07/21/2023  INDICATIONS: Lisa Duran is a 42 y.o.-year-old female with right hip instability.  The risk and benefits of the procedure were discussed in detail and documented in the pre-operative evaluation.   PREOPERATIVE DIAGNOSIS: 1. Right hip instability  POSTOPERATIVE DIAGNOSIS: Same.  PROCEDURE: 1. Right arthroscopic labral repair  SURGEON: Benancio Deeds MD  ASSISTANT: Kerby Less, ATC  ANESTHESIA:  general  IV FLUIDS AND URINE: See anesthesia record.  ANTIBIOTICS: Ancef  ESTIMATED BLOOD LOSS: 5 mL.  IMPLANTS:  Implant Name Type Inv. Item Serial No. Manufacturer Lot No. LRB No. Used Action  ANCHOR SUT 1.4 FLEX - [id number] Anchor ANCHOR SUT 1.4 FLEX  [employer] [id number] Right 1 Implanted  ANCHOR SUT 1.4 FLEX - [id number] Anchor ANCHOR SUT 1.4 FLEX  [employer] [id number] Right 1 Implanted    DRAINS: None  CULTURES: None  COMPLICATIONS: none  DESCRIPTION OF PROCEDURE:   Cartilage Intact femoral and acetabular cartilage   Labrum Frayed, torn appearing   Boundaries of labral tear Convention (3 o'clock anterior, 9 o'clock posterior) Anterior boundary: 3 o'clock Posterior boundary: 1 o'clock   OPERATIVE REPORT:  The patient was brought to the operating room, placed supine on the operating table, and bony prominences were padded.  The traction boots were applied with padding to ensure that safe traction could be applied through the feet.  The contralateral limb was abducted maximally and light traction was applied.  The operative leg was brought into neutral position.  The flouroscopic c-arm was brought between the legs for an AP image.  The patient was prepped and draped in a sterile fashion.  Time-out was performed and landmarks were identified. Traction was obtained and care was taken to ensure the least amount of force necessary to allow safe access to the joint of 8-48mm.  This was checked with fluoroscopy.    Next we placed an  anterolateral portal under the assistance of fluoroscopy.  First, fluoroscopy was used to estimate the trajectory and starting point.  A 5mm incision with a #11 blade was made and a straight hemostat was used to dilate the portal through the appropriate tract.  We then placed a 14-gauge hypodermic needle with careful technique to be as close to the femoral head as possible and parallel to the sorcele to ensure no iatrogenic damage to the labrum.  This released the negative pressure environment and the amount of traction was adjusted to maintain the 8-75mm of distraction.  A nitinol wire was placed through the needle and flouroscopy was used to ensure it extended to the medial wall of the acetabulum.  The Flowport from [employer] was placed over the wire and the nitinol wire was retracted to just inside the capsule during insertion of the dilator and cannula to minimize the risk of breakage. The arthroscope was placed next and we visualized the anterior triangle.     We then placed the anterior portal under direct visualization using the technique described above.  This was safely placed as well without damage to the labrum or femoral head.  We then switched our arthroscope to the anterior portal to ensure we were not through the labrum - we were safely through the capsule only.  We then proceeded with a transverse capsulotomy connecting the 2 portals in the same plane utilizing the Samurai blade from [employer].  We identified the anterior inferior iliac spine proximally, the psoas tendon medially and the rectus tendon laterally as landmarks.  We then proceeded  with a diagnostic arthroscopy - the results can be found in the findings section above.    We then used the 50 degree hip specific radiofrequency device from [employer]. and a 4mm shaver to clear the superior acetabulum and expose the subspinous region.  Next we exposed the acetabular rim leaving the chondral labral junction intact.   Working from both portals, the acetabular rim/subspinous region was reshaped with 5.5 mm bur consistent with the preoperative three-dimensional imaging.   When adequate reshaping was obtained we then proceeded with the labral repair. We placed 2 anchors at the 1:00 and 2:30 positions. The sutures were passed using the crescent Nanopass from [employer].  This resulted in anatomic labral repair.  We debrided the loose cartilage at the rim and residual degenerative labral tissue.  Traction was let down with total traction time of 33 minutes.     Finally, we performed a complete capsular closure with tape suture.  She was replaced in the anterior and posterior limb of the reported capsulotomy with excellent apposition. We then removed the arthroscope and closed the incisions with 3-0 nylon simple stitches.  A sterile dressing was applied..  The patient was awakened from anesthesia and transferred to PACU in stable condition. Postoperative care includes:       POSTOPERATIVE PLAN:    2 weeks touchdown weight bearing, hip labral repair protocol Formal physical therapy will begin this week. ASA 325 Daily for DVT prophylaxis  Benancio Deeds, MD 3:18 PM

## 2023-07-21 NOTE — Transfer of Care (Signed)
Immediate Anesthesia Transfer of Care Note  Patient: Emelene Heppler  Procedure(s) Performed: RIGHT Arthroscopy Hip with Labral Repair (Right: Hip)  Patient Location: PACU  Anesthesia Type:General  Level of Consciousness: sedated  Airway & Oxygen Therapy: Patient Spontanous Breathing and Patient connected to face mask oxygen  Post-op Assessment: Report given to RN and Post -op Vital signs reviewed and stable  Post vital signs: Reviewed and stable  Last Vitals:  Vitals Value Taken Time  BP 101/68 07/21/23 1520  Temp    Pulse 68 07/21/23 1524  Resp 12 07/21/23 1524  SpO2 100 % 07/21/23 1524  Vitals shown include unfiled device data.  Last Pain:  Vitals:   07/21/23 1520  TempSrc:   PainSc: Asleep         Complications: No notable events documented.

## 2023-07-21 NOTE — Brief Op Note (Signed)
   Brief Op Note  Date of Surgery: 07/21/2023  Preoperative Diagnosis: RIGHT HIP LABRAL TEAR  Postoperative Diagnosis: same  Procedure: Procedure(s): RIGHT Arthroscopy Hip with Labral Repair  Implants: Implant Name Type Inv. Item Serial No. Manufacturer Lot No. LRB No. Used Action  ANCHOR SUT 1.4 FLEX - [id number] Anchor ANCHOR SUT 1.4 FLEX  [employer] [id number] Right 1 Implanted  ANCHOR SUT 1.4 FLEX - [id number] Anchor ANCHOR SUT 1.4 FLEX  [employer] [id number] Right 1 Implanted    Surgeons: Surgeon(s): Huel Cote, MD  Anesthesia: General    Estimated Blood Loss: See anesthesia record  Complications: None  Condition to PACU: Stable  Benancio Deeds, MD 07/21/2023 3:18 PM

## 2023-07-21 NOTE — Anesthesia Procedure Notes (Signed)
Procedure Name: Intubation Date/Time: 07/21/2023 1:48 PM  Performed by: Alwyn Ren, CRNAPre-anesthesia Checklist: Patient identified, Emergency Drugs available, Suction available and Patient being monitored Patient Re-evaluated:Patient Re-evaluated prior to induction Oxygen Delivery Method: Circle system utilized Preoxygenation: Pre-oxygenation with 100% oxygen Induction Type: IV induction Ventilation: Mask ventilation without difficulty Laryngoscope Size: Miller and 2 Grade View: Grade I Tube type: Oral Tube size: 7.0 mm Number of attempts: 1 Airway Equipment and Method: Stylet and Oral airway Placement Confirmation: ETT inserted through vocal cords under direct vision, positive ETCO2 and breath sounds checked- equal and bilateral Secured at: 21 cm Tube secured with: Tape Dental Injury: Teeth and Oropharynx as per pre-operative assessment

## 2023-07-21 NOTE — H&P (Signed)
Chief Complaint: Right hip pain        History of Present Illness:      Lisa Duran is a 42 y.o. female presents with right hip pain after June.  She did not have any specific injury although she was helping to move at work at this time.  She works locally for [employer].  She does have a history of a left knee meniscal root repair done with Dr. August Saucer in January of this year.  She has previously had an injection into the right hip with Dr. Jean Rosenthal which gave her 1 week of relief.  She has been working on a home exercise program.  She states that the pain is in the C-shaped distribution of the right hip and groin area.  She states this is catching and clicking.       Surgical History:   None   PMH/PSH/Family History/Social History/Meds/Allergies:         Past Medical History:  Diagnosis Date   ADHD (attention deficit hyperactivity disorder)     ALLERGIC RHINITIS     Allergy     Anemia     ANXIETY     Depression     DIABETES MELLITUS, TYPE II     Fibromyalgia 2018   GERD (gastroesophageal reflux disease)     MIGRAINE, COMMON     Obesity, unspecified     OVARIAN CYST               Past Surgical History:  Procedure Laterality Date   NO PAST SURGERIES            Social History         Socioeconomic History   Marital status: Married      Spouse name: Not on file   Number of children: Not on file   Years of education: Not on file   Highest education level: Bachelor's degree (e.g., BA, AB, BS)  Occupational History   Not on file  Tobacco Use   Smoking status: Former      Current packs/day: 0.00      Types: Cigarettes      Quit date: 08/20/2002      Years since quitting: 20.8   Smokeless tobacco: Never   Tobacco comments:      single, separated from spouse 02/2010.   Vaping Use   Vaping status: Never Used  Substance and Sexual Activity   Alcohol use: Yes      Comment: couple of drinks a week   Drug use: No   Sexual activity: Not Currently       Birth control/protection: I.U.D.  Other Topics Concern   Not on file  Social History Narrative    Single, separated from spouse 02/2010    Social Determinants of Health        Financial Resource Strain: Low Risk  (11/18/2022)    Overall Financial Resource Strain (CARDIA)     Difficulty of Paying Living Expenses: Not very hard  Food Insecurity: No Food Insecurity (11/18/2022)    Hunger Vital Sign     Worried About Running Out of Food in the Last Year: Never true     Ran Out of Food in the Last Year: Never true  Transportation Needs: No Transportation Needs (11/18/2022)    PRAPARE - Therapist, art (Medical): No     Lack of Transportation (Non-Medical): No  Physical Activity: Unknown (11/18/2022)    Exercise  Vital Sign     Days of Exercise per Week: Patient declined     Minutes of Exercise per Session: Not on file  Stress: Stress Concern Present (11/18/2022)    [employer] of Occupational Health - Occupational Stress Questionnaire     Feeling of Stress : To some extent  Social Connections: Socially Integrated (11/18/2022)    Social Connection and Isolation Panel [NHANES]     Frequency of Communication with Friends and Family: Twice a week     Frequency of Social Gatherings with Friends and Family: Once a week     Attends Religious Services: More than 4 times per year     Active Member of [employer] or Organizations: Yes     Attends Engineer, structural: More than 4 times per year     Marital Status: Living with partner         Family History  Problem Relation Age of Onset   Diabetes Mother     Hyperlipidemia Mother     CAD Mother     CVA Mother     Diabetes Father     Hyperlipidemia Father     Coronary artery disease Father     Hypertension Father     Cancer Father          esoph   Stroke Father     Cancer Maternal Grandfather 71        Prostate and Lung   Diabetes Sister          Gestational   Graves' disease Maternal Aunt     Cancer  Maternal Uncle          Brain   Stroke Paternal Grandmother     Heart disease Paternal Grandmother     Stroke Paternal Grandfather     Heart disease Paternal Grandfather     Diabetes Sister          Gestational and Type II   Hashimoto's thyroiditis Cousin     Cancer Maternal Uncle          Brain        Allergies       Allergies  Allergen Reactions   Chicken Allergy Anaphylaxis and Shortness Of Breath   Covid-19 Mrna Vacc (Moderna) Anaphylaxis   Other Itching, Swelling and Other (See Comments)      NUTS (of ANY kind): Lips and mouth swell, but no breathing impairment & migraines and eczema are triggered   Propofol Shortness Of Breath      SOB, chest tightness, wheezing after colonoscopy on 09-18-15   Basaglar Kwikpen [Insulin Glargine] Palpitations and Hypertension   Adhesive [Tape] Other (See Comments)      Makes the skin VERY RED     Gluten Meal Diarrhea   Peanut-Containing Drug Products Itching, Swelling and Other (See Comments)      Lips and mouth swell, but no breathing impairment & migraines and eczema are triggered     Novolog [Insulin Aspart] Rash   Evaristo Bury [Insulin Degludec] Rash      eczema   Victoza [Liraglutide] Rash            Current Outpatient Medications  Medication Sig Dispense Refill   ALPRAZolam (XANAX) 0.5 MG tablet TAKE 1 TABLET(0.5 MG) BY MOUTH THREE TIMES DAILY AS NEEDED FOR ANXIETY 60 tablet 5   amphetamine-dextroamphetamine (ADDERALL) 20 MG tablet Take 1 tablet (20 mg total) by mouth 2 (two) times daily. 180 tablet 0   aspirin 81 MG chewable tablet CHEW  AND SWALLOW 1 TABLET(81 MG) BY MOUTH TWICE DAILY FOR BLOOD CLOT PREVENTION 180 tablet 0   celecoxib (CELEBREX) 100 MG capsule TAKE 1 CAPSULE(100 MG) BY MOUTH TWICE DAILY 60 capsule 0   Continuous Glucose Sensor (FREESTYLE LIBRE 3 SENSOR) MISC 1 each by Does not apply route every 14 (fourteen) days. 6 each 3   cyanocobalamin (VITAMIN B12) 1000 MCG/ML injection Inject 1 mL (1,000 mcg total) into the  muscle every 30 (thirty) days. 10 mL 1   EPINEPHrine 0.3 mg/0.3 mL IJ SOAJ injection Use as needed for hypersensitivity reaction 1 each 0   Fexofenadine HCl (ALLEGRA ALLERGY PO) Take by mouth.       glucose blood (ONETOUCH VERIO) test strip Use 2x a day 200 each 3   Insulin Syringe-Needle U-100 (B-D INS SYR ULTRAFINE .5CC/30G) 30G X 1/2" 0.5 ML MISC Use to inject insulin once daily. 100 each 11   levonorgestrel (MIRENA) 20 MCG/24HR IUD 1 Intra Uterine Device (1 each total) by Intrauterine route once. 1 each 0   meloxicam (MOBIC) 15 MG tablet Take 1 tablet (15 mg total) by mouth daily. 30 tablet 0   meloxicam (MOBIC) 15 MG tablet Take 1 tablet (15 mg total) by mouth daily. 30 tablet 0   metFORMIN (GLUCOPHAGE) 1000 MG tablet TAKE 2 TABLETS BY MOUTH DAILY WITH SUPPER. 180 tablet 3   methocarbamol (ROBAXIN) 500 MG tablet Take 1 tablet (500 mg total) by mouth every 8 (eight) hours as needed for muscle spasms. 30 tablet 1   metoprolol succinate (TOPROL-XL) 25 MG 24 hr tablet TAKE 1 TABLET BY MOUTH DAILY 90 tablet 3   omeprazole (PRILOSEC) 40 MG capsule TAKE 1 CAPSULE BY MOUTH DAILY BEFORE SUPPER 90 capsule 2   ondansetron (ZOFRAN ODT) 4 MG disintegrating tablet Take 1 tablet (4 mg total) by mouth every 8 (eight) hours as needed for nausea or vomiting. 20 tablet 0   promethazine (PHENERGAN) 25 MG tablet Take 1 tablet (25 mg total) by mouth every 6 (six) hours as needed for nausea. 15 tablet 0   rizatriptan (MAXALT) 10 MG tablet Take 1 tablet (10 mg total) by mouth as needed for migraine. May repeat in 2 hours if needed 10 tablet 8   rosuvastatin (CRESTOR) 5 MG tablet TAKE 1 TABLET(5 MG TOTAL) BY MOUTH 2(TWO) TIMES A WEEK 13 tablet 3   Semaglutide,0.25 or 0.5MG /DOS, (OZEMPIC, 0.25 OR 0.5 MG/DOSE,) 2 MG/3ML SOPN INJECT 0.5MG  UNDER THE SKIN ONCE A WEEK 9 mL 3   SYRINGE-NEEDLE, DISP, 3 ML 25G X 1" 3 ML MISC Use for monthly B12 injections 12 each 0   triamcinolone cream (KENALOG) 0.1 % Apply 1 application  topically 2 (two) times daily as needed (for itching). 28.4 g 1      No current facility-administered medications for this visit.      Imaging Results (Last 48 hours)  No results found.     Review of Systems:   A ROS was performed including pertinent positives and negatives as documented in the HPI.   Physical Exam :   Constitutional: NAD and appears stated age Neurological: Alert and oriented Psych: Appropriate affect and cooperative There were no vitals taken for this visit.    Comprehensive Musculoskeletal Exam:     Inspection Right Left  Skin No atrophy or gross abnormalities appreciated No atrophy or gross abnormalities appreciated  Palpation      Tenderness None None  Crepitus None None  Range of Motion      Flexion (passive)  120 120  Extension 30 30  IR 30 with pain 30  ER 40 45  Strength      Flexion  5/5 5/5  Extension 5/5 5/5  Special Tests      FABER Negative Negative  FADIR Positive Negative  ER Lag/Capsular Insufficiency Negative Negative  Instability Negative Negative  Sacroiliac pain Negative  Negative   Instability      Generalized Laxity No No  Neurologic      sciatic, femoral, obturator nerves intact to light sensation  Vascular/Lymphatic      DP pulse 2+ 2+  Lumbar Exam      Patient has symmetric lumbar range of motion with negative pain referral to hip        Imaging:   Xray (4 views right hip): Normal   MRI (right hip): Anterior superior labral tear   I personally reviewed and interpreted the radiographs.     Assessment:   42 y.o. female with an anterior superior labral tear of the right hip which is consistent with her C-shaped pain and instability.  She is experiencing mechanical symptoms at this time.  She has previously had an injection with Dr. Jean Rosenthal which gave her temporary very significant relief.  She has been working on a home exercise strengthening program.  Given this we did discuss additional options.  I do believe that  she would be a candidate for right hip arthroscopy with labral repair.  At this time I do believe that enrolling her in physical therapy for prehab would be beneficial to help get her strengthening the right hip.  I did discuss risks and limitations associated with this.  I did discuss the rehab associated with arthroscopic labral pair.  After discussion she is elected for this   Plan :     -Plan for right hip arthroscopy with labral repair     After a lengthy discussion of treatment options, including risks, benefits, alternatives, complications of surgical and nonsurgical conservative options, the patient elected surgical repair.    The patient  is aware of the material risks  and complications including, but not limited to injury to adjacent structures, neurovascular injury, infection, numbness, bleeding, implant failure, thermal burns, stiffness, persistent pain, failure to heal, disease transmission from allograft, need for further surgery, dislocation, anesthetic risks, blood clots, risks of death,and others. The probabilities of surgical success and failure discussed with patient given their particular co-morbidities.The time and nature of expected rehabilitation and recovery was discussed.The patient's questions were all answered preoperatively.  No barriers to understanding were noted. I explained the natural history of the disease process and Rx rationale.  I explained to the patient what I considered to be reasonable expectations given their personal situation.  The final treatment plan was arrived at through a shared patient decision making process model.           I personally saw and evaluated the patient, and participated in the management and treatment plan.   Huel Cote, MD Attending Physician, Orthopedic Surgery   This document was dictated using Dragon voice recognition software. A reasonable attempt at proof reading has been made to minimize errors.

## 2023-07-21 NOTE — Discharge Instructions (Signed)
Discharge Instructions    Attending Surgeon: Huel Cote, MD Office Phone Number: [phone]   Diagnosis and Procedures:    Surgeries Performed: Right hip instability  Discharge Plan:    Diet: Resume usual diet. Begin with light or bland foods.  Drink plenty of fluids.  Activity:  Weight bearing as tolerated right leg. You are advised to go home directly from the hospital or surgical center. Restrict your activities.  GENERAL INSTRUCTIONS: 1.  Please apply ice to your wound to help with swelling and inflammation. This will improve your comfort and your overall recovery following surgery.     2. Please call Dr. Serena Croissant office at [phone] with questions Monday-Friday during business hours. If no one answers, please leave a message and someone should get back to the patient within 24 hours. For emergencies please call 911 or proceed to the emergency room.   3. Patient to notify surgical team if experiences any of the following: Bowel/Bladder dysfunction, uncontrolled pain, nerve/muscle weakness, incision with increased drainage or redness, nausea/vomiting and Fever greater than 101.0 F.  Be alert for signs of infection including redness, streaking, odor, fever or chills. Be alert for excessive pain or bleeding and notify your surgeon immediately.  WOUND INSTRUCTIONS:   Leave your dressing, cast, or splint in place until your post operative visit.  Keep it clean and dry.  Always keep the incision clean and dry until the staples/sutures are removed. If there is no drainage from the incision you should keep it open to air. If there is drainage from the incision you must keep it covered at all times until the drainage stops  Do not soak in a bath tub, hot tub, pool, lake or other body of water until 21 days after your surgery and your incision is completely dry and healed.  If you have removable sutures (or staples) they must be removed 10-14 days (unless otherwise  instructed) from the day of your surgery.     1)  Elevate the extremity as much as possible.  2)  Keep the dressing clean and dry.  3)  Please call us if the dressing becomes wet or dirty.  4)  If you are experiencing worsening pain or worsening swelling, please call.     MEDICATIONS: Resume all previous home medications at the previous prescribed dose and frequency unless otherwise noted Start taking the  pain medications on an as-needed basis as prescribed  Please taper down pain medication over the next week following surgery.  Ideally you should not require a refill of any narcotic pain medication.  Take pain medication with food to minimize nausea. In addition to the prescribed pain medication, you may take over-the-counter pain relievers such as Tylenol.  Do NOT take additional tylenol if your pain medication already has tylenol in it.  Aspirin 325mg  daily per instructions on bottle. Narcotic policy: Per [facility] clinic policy, our goal is ensure optimal postoperative pain control with a multimodal pain management strategy. For all [facility] patients, our goal is to wean post-operative narcotic medications by 6 weeks post-operatively, and many times sooner. If this is not possible due to utilization of pain medication prior to surgery, your [facility] doctor will support your acute post-operative pain control for the first 6 weeks postoperatively, with a plan to transition you back to your primary pain team following that. Cyndia Skeeters will work to ensure a Therapist, occupational.       FOLLOWUP INSTRUCTIONS: 1. Follow up at the Physical Therapy Clinic  3-4 days following surgery. This appointment should be scheduled unless other arrangements have been made.The Physical Therapy scheduling number is [phone] if an appointment has not already been arranged.  2. Contact Dr. Serena Croissant office during office hours at [phone] or the practice after hours line at [phone] for non-emergencies.  For medical emergencies call 911.   Discharge Location: Home

## 2023-07-21 NOTE — Interval H&P Note (Signed)
History and Physical Interval Note:  07/21/2023 11:47 AM  Lisa Duran  has presented today for surgery, with the diagnosis of RIGHT HIP LABRAL TEAR.  The various methods of treatment have been discussed with the patient and family. After consideration of risks, benefits and other options for treatment, the patient has consented to  Procedure(s): RIGHT Arthroscopy Hip with Labral Repair (Right) as a surgical intervention.  The patient's history has been reviewed, patient examined, no change in status, stable for surgery.  I have reviewed the patient's chart and labs.  Questions were answered to the patient's satisfaction.     Huel Cote

## 2023-07-22 NOTE — Anesthesia Postprocedure Evaluation (Signed)
Anesthesia Post Note  Patient: Nickayla Albiter  Procedure(s) Performed: RIGHT Arthroscopy Hip with Labral Repair (Right: Hip)     Patient location during evaluation: PACU Anesthesia Type: General Level of consciousness: awake and alert Pain management: pain level controlled Vital Signs Assessment: post-procedure vital signs reviewed and stable Respiratory status: spontaneous breathing, nonlabored ventilation, respiratory function stable and patient connected to nasal cannula oxygen Cardiovascular status: blood pressure returned to baseline and stable Postop Assessment: no apparent nausea or vomiting Anesthetic complications: no  No notable events documented.  Last Vitals:  Vitals:   07/21/23 1615 07/21/23 1630  BP: 106/80 102/74  Pulse: 73 60  Resp: 17 12  Temp:  36.6 C  SpO2: 93% 99%    Last Pain:  Vitals:   07/21/23 1609  TempSrc:   PainSc: 5    Pain Goal:                   Shanikqua Zarzycki L Christie Copley

## 2023-07-25 ENCOUNTER — Encounter (HOSPITAL_BASED_OUTPATIENT_CLINIC_OR_DEPARTMENT_OTHER): Payer: Self-pay | Admitting: Physical Therapy

## 2023-07-25 ENCOUNTER — Other Ambulatory Visit: Payer: Self-pay

## 2023-07-25 ENCOUNTER — Ambulatory Visit (HOSPITAL_BASED_OUTPATIENT_CLINIC_OR_DEPARTMENT_OTHER): Payer: 59 | Attending: Orthopaedic Surgery | Admitting: Physical Therapy

## 2023-07-25 DIAGNOSIS — M25551 Pain in right hip: Secondary | ICD-10-CM | POA: Diagnosis present

## 2023-07-25 DIAGNOSIS — R262 Difficulty in walking, not elsewhere classified: Secondary | ICD-10-CM | POA: Diagnosis present

## 2023-07-25 DIAGNOSIS — M6281 Muscle weakness (generalized): Secondary | ICD-10-CM | POA: Diagnosis present

## 2023-07-25 NOTE — Therapy (Addendum)
OUTPATIENT PHYSICAL THERAPY TREATMENT   Patient Name: Lisa Duran MRN: [id number] DOB:Dec 13, 1980, 42 y.o., female Today's Date: 07/25/2023  END OF SESSION:  PT End of Session - 07/25/23 1224     Visit Number 2    Number of Visits 22    Date for PT Re-Evaluation 10/10/23    Authorization Type [employer]    PT Start Time 0915    PT Stop Time 0945    PT Time Calculation (min) 30 min    Activity Tolerance Patient tolerated treatment well    Behavior During Therapy WFL for tasks assessed/performed             Past Medical History:  Diagnosis Date   ADHD (attention deficit hyperactivity disorder)    ALLERGIC RHINITIS    Allergy    Anemia    ANXIETY    Depression    DIABETES MELLITUS, TYPE II    Fibromyalgia 2018   GERD (gastroesophageal reflux disease)    MIGRAINE, COMMON    Obesity, unspecified    OVARIAN CYST    Past Surgical History:  Procedure Laterality Date   KNEE ARTHROSCOPY Left 08/25/2022   Patient Active Problem List   Diagnosis Date Noted   Tear of right acetabular labrum 07/21/2023   Cellulitis of abdominal wall 12/26/2022   B12 deficiency 05/09/2021   Dyslipidemia 04/14/2019   Enteritis 01/12/2019   Sleep walking disorder 04/06/2017   Iron deficiency anemia 03/27/2017   Fibromyalgia 02/03/2017   GERD (gastroesophageal reflux disease) 06/24/2015   Eczema 06/22/2015   Depression 06/22/2015   Tachycardia 06/16/2014   Food allergy    ADHD (attention deficit hyperactivity disorder) 01/27/2011   Anxiety 02/20/2010   Allergic rhinitis 11/21/2009   OVARIAN CYST 11/21/2009   Diabetes mellitus type 2 with neurological manifestations (HCC) 11/20/2009   Class 2 obesity in adult 11/20/2009   Migraine without aura 11/20/2009    REFERRING PROVIDER:  Huel Cote, MD     REFERRING DIAG: [phone] (ICD-10-CM) - Tear of right acetabular labrum, initial encounter   Post op labral repair  Rationale for Evaluation and Treatment: Rehabilitation  THERAPY  DIAG:  Pain in right hip  Difficulty in walking, not elsewhere classified  Muscle weakness (generalized)  ONSET DATE: 07/21/23   SUBJECTIVE:                                                                                                                                                                                           SUBJECTIVE STATEMENT: It is really doing okay.  Some return of sensation into mid thigh that feels a little bit like burning. PERTINENT HISTORY:  Fibromyalgia, Iron  deficiency  PAIN:  Are you having pain? Yes: NPRS scale: Minimal/10 Pain location: Rt hip Pain description: ache Aggravating factors: Activation of hip flexors, sitting too long Relieving factors: stretching and getting weight off of knee  PRECAUTIONS:  None  RED FLAGS: None   WEIGHT BEARING RESTRICTIONS:  Yes touchdown weightbearing for 2 weeks  FALLS:  Has patient fallen in last 6 months? No  OCCUPATION:  Print production planner, has to lift boxes of medications  PLOF:  Independent  PATIENT GOALS:  Walk her dog that is about 100 pounds, hiking   OBJECTIVE:  Note: Objective measures were completed at Evaluation unless otherwise noted.  COGNITIVE STATUS: Within functional limits for tasks assessed   SENSATION: WFL  EDEMA:  No  POSTURE:  No Significant postural limitations  FOTO:  12/14: 42  GAIT:  12/14 Comments: Arrived with bilateral axillary crutches and hip brace with touchdown weightbearing precautions followed      TREATMENT:                                                                                                                               Treatment                            12/14:  See HEP for exercises discussed Change bandages Passive range of motion Supine leg extended internal and external rotation motion, glutes set to reduce anterior hip discomfort     PATIENT EDUCATION:  Education details: Anatomy of condition, POC, HEP, exercise  form/rationale Person educated: Patient Education method: Explanation, Demonstration, Tactile cues, Verbal cues, and Handouts Education comprehension: verbalized understanding, returned demonstration, verbal cues required, and tactile cues required  HOME EXERCISE PROGRAM: [id number]-prehab program  New postop program: [facility]   ASSESSMENT:  CLINICAL IMPRESSION: Patient is a 42 y.o. F who was seen today for physical therapy evaluation and treatment status post right hip labral repair.  At this time she has met short-term range of motion goals and was educated on the importance of reduced hip flexor activation.  She is ambulating well with reduced weightbearing and use of bilateral crutches.    REHAB POTENTIAL: Good  CLINICAL DECISION MAKING: Stable/uncomplicated  EVALUATION COMPLEXITY: Low   GOALS: Goals reviewed with patient? Yes  SHORT TERM GOALS: Target date: 1/7  Pain free flexion to 90 Baseline: Goal status: INITIAL   2.  Demo controlled quad set with hold Baseline:  Goal status: INITIAL   3.  30s sit to stand without pain or compensation Baseline:  Goal status: INITIAL   4.  SLS 30s without compensation Baseline:  Goal status: INITIAL   LONG TERM GOALS:  Able to demo step up on 6" step with level pelvis and proper form Baseline:  Goal status: INITIAL Week 8 2/4  2.  Will tolerate at least 3 min on elliptical, demonstrating good tolerance to repetitive weight bearing motion Baseline:  Goal status: INITIAL  Week 8 2/4  3.  Demonstrate proper form in at least 10 continuous lunges without increased pain Baseline:  Goal status: INITIAL  Week 12 3/4  4.  Demonstrate gentle, double and single foot plyometric motions with good proximal form Baseline:  Goal status: INITIAL Week 12 3/4  5.  Patient will demonstrate appropriate proximal control for navigating unstable surfaces and return to hiking Baseline:  Goal status: INITIAL   6.  Patient will be able  to walk her dog without limitation by her hip Baseline:  Goal status: INITIAL    PLAN:  PT FREQUENCY: 1-2 visits per week  PT DURATION: POC date  PLANNED INTERVENTIONS: 97164- PT Re-evaluation, 97110-Therapeutic exercises, 97530- Therapeutic activity, 97112- Neuromuscular re-education, 97535- Self Care, 65784- Manual therapy, [phone]- Gait training, [phone]- Aquatic Therapy, Patient/Family education, Balance training, Stair training, Taping, Dry Needling, Joint mobilization, Spinal mobilization, Scar mobilization, Cryotherapy, and Moist heat.  PLAN FOR NEXT SESSION: per protocol   Kishan Wachsmuth C. Bueford Arp PT, DPT 07/25/23 12:33 PM

## 2023-07-31 ENCOUNTER — Ambulatory Visit (HOSPITAL_BASED_OUTPATIENT_CLINIC_OR_DEPARTMENT_OTHER): Payer: 59 | Admitting: Physical Therapy

## 2023-07-31 ENCOUNTER — Encounter (HOSPITAL_BASED_OUTPATIENT_CLINIC_OR_DEPARTMENT_OTHER): Payer: Self-pay | Admitting: Orthopaedic Surgery

## 2023-07-31 ENCOUNTER — Encounter (HOSPITAL_BASED_OUTPATIENT_CLINIC_OR_DEPARTMENT_OTHER): Payer: Self-pay | Admitting: Physical Therapy

## 2023-07-31 ENCOUNTER — Ambulatory Visit (HOSPITAL_BASED_OUTPATIENT_CLINIC_OR_DEPARTMENT_OTHER): Payer: 59 | Admitting: Orthopaedic Surgery

## 2023-07-31 DIAGNOSIS — M25551 Pain in right hip: Secondary | ICD-10-CM | POA: Diagnosis not present

## 2023-07-31 DIAGNOSIS — M6281 Muscle weakness (generalized): Secondary | ICD-10-CM

## 2023-07-31 DIAGNOSIS — S73191A Other sprain of right hip, initial encounter: Secondary | ICD-10-CM

## 2023-07-31 DIAGNOSIS — R262 Difficulty in walking, not elsewhere classified: Secondary | ICD-10-CM

## 2023-07-31 NOTE — Progress Notes (Signed)
Post Operative Evaluation    Procedure/Date of Surgery: 12/10 right hip labral repair  Interval History:   Status post right hip arthroscopic labral repair overall doing extremely well.  She has been utilizing crutches.  She is overall progressing with physical therapy.  She does have some outside soreness but nothing deep in the groin.   PMH/PSH/Family History/Social History/Meds/Allergies:    Past Medical History:  Diagnosis Date   ADHD (attention deficit hyperactivity disorder)    ALLERGIC RHINITIS    Allergy    Anemia    ANXIETY    Depression    DIABETES MELLITUS, TYPE II    Fibromyalgia 2018   GERD (gastroesophageal reflux disease)    MIGRAINE, COMMON    Obesity, unspecified    OVARIAN CYST    Past Surgical History:  Procedure Laterality Date   KNEE ARTHROSCOPY Left 08/25/2022   Social History   Socioeconomic History   Marital status: Married    Spouse name: Not on file   Number of children: Not on file   Years of education: Not on file   Highest education level: Bachelor's degree (e.g., BA, AB, BS)  Occupational History   Not on file  Tobacco Use   Smoking status: Former    Current packs/day: 0.00    Types: Cigarettes    Quit date: 08/20/2002    Years since quitting: 20.9   Smokeless tobacco: Never   Tobacco comments:    single, separated from spouse 02/2010.   Vaping Use   Vaping status: Never Used  Substance and Sexual Activity   Alcohol use: Yes    Comment: couple of drinks a week   Drug use: No   Sexual activity: Not Currently    Birth control/protection: I.U.D.  Other Topics Concern   Not on file  Social History Narrative   Single, separated from spouse 02/2010   Social Drivers of Health   Financial Resource Strain: Low Risk  (11/18/2022)   Overall Financial Resource Strain (CARDIA)    Difficulty of Paying Living Expenses: Not very hard  Food Insecurity: No Food Insecurity (11/18/2022)   Hunger Vital Sign     Worried About Running Out of Food in the Last Year: Never true    Ran Out of Food in the Last Year: Never true  Transportation Needs: No Transportation Needs (11/18/2022)   PRAPARE - Administrator, Civil Service (Medical): No    Lack of Transportation (Non-Medical): No  Physical Activity: Unknown (11/18/2022)   Exercise Vital Sign    Days of Exercise per Week: Patient declined    Minutes of Exercise per Session: Not on file  Stress: Stress Concern Present (11/18/2022)   [employer] of Occupational Health - Occupational Stress Questionnaire    Feeling of Stress : To some extent  Social Connections: Socially Integrated (11/18/2022)   Social Connection and Isolation Panel [NHANES]    Frequency of Communication with Friends and Family: Twice a week    Frequency of Social Gatherings with Friends and Family: Once a week    Attends Religious Services: More than 4 times per year    Active Member of [employer] or Organizations: Yes    Attends Engineer, structural: More than 4 times per year    Marital Status: Living with partner   Family History  Problem Relation Age of Onset   Diabetes Mother    Hyperlipidemia Mother    CAD Mother    CVA Mother    Diabetes Father    Hyperlipidemia Father    Coronary artery disease Father    Hypertension Father    Cancer Father        esoph   Stroke Father    Cancer Maternal Grandfather 32       Prostate and Lung   Diabetes Sister        Gestational   Graves' disease Maternal Aunt    Cancer Maternal Uncle        Brain   Stroke Paternal Grandmother    Heart disease Paternal Grandmother    Stroke Paternal Grandfather    Heart disease Paternal Grandfather    Diabetes Sister        Gestational and Type II   Hashimoto's thyroiditis Cousin    Cancer Maternal Uncle        Brain   Allergies  Allergen Reactions   Chicken Allergy Anaphylaxis and Shortness Of Breath   Covid-19 Mrna Vacc (Moderna) Anaphylaxis   Other Itching,  Swelling and Other (See Comments)    NUTS (of ANY kind): Lips and mouth swell, but no breathing impairment & migraines and eczema are triggered   Propofol Shortness Of Breath    SOB, chest tightness, wheezing after colonoscopy on 09-18-15  Received propofol for surgery 07/21/23 without complication   Basaglar Stephanie Coup [Insulin Glargine] Palpitations and Hypertension   Adhesive [Tape] Other (See Comments)    Makes the skin VERY RED    Gluten Meal Diarrhea   Peanut-Containing Drug Products Itching, Swelling and Other (See Comments)    Lips and mouth swell, but no breathing impairment & migraines and eczema are triggered    Novolog [Insulin Aspart (Human Analog)] Rash   Evaristo Bury [Insulin Degludec] Rash    eczema   Victoza [Liraglutide] Rash   Current Outpatient Medications  Medication Sig Dispense Refill   acetaminophen (TYLENOL) 500 MG tablet Take 1 tablet (500 mg total) by mouth every 8 (eight) hours for 10 days. 30 tablet 0   ALPRAZolam (XANAX) 0.5 MG tablet TAKE 1 TABLET(0.5 MG) BY MOUTH THREE TIMES DAILY AS NEEDED FOR ANXIETY 60 tablet 5   amphetamine-dextroamphetamine (ADDERALL) 20 MG tablet Take 1 tablet (20 mg total) by mouth 2 (two) times daily. 180 tablet 0   aspirin EC 325 MG tablet Take 1 tablet (325 mg total) by mouth daily. 14 tablet 0   Continuous Glucose Sensor (FREESTYLE LIBRE 3 SENSOR) MISC 1 each by Does not apply route every 14 (fourteen) days. 6 each 3   cyanocobalamin (VITAMIN B12) 1000 MCG/ML injection Inject 1 mL (1,000 mcg total) into the muscle every 30 (thirty) days. 10 mL 1   EPINEPHrine 0.3 mg/0.3 mL IJ SOAJ injection Use as needed for hypersensitivity reaction 1 each 0   Fexofenadine HCl (ALLEGRA ALLERGY PO) Take 1 tablet by mouth daily as needed (Allergies).     glucose blood (ONETOUCH VERIO) test strip Use 2x a day 200 each 3   ibuprofen (ADVIL) 800 MG tablet Take 1 tablet (800 mg total) by mouth every 8 (eight) hours for 10 days. Please take with food, please  alternate with acetaminophen 30 tablet 0   Insulin Syringe-Needle U-100 (B-D INS SYR ULTRAFINE .5CC/30G) 30G X 1/2" 0.5 ML MISC Use to inject insulin once daily. 100 each 11   levonorgestrel (MIRENA) 20 MCG/24HR IUD 1 Intra Uterine Device (1  each total) by Intrauterine route once. 1 each 0   metFORMIN (GLUCOPHAGE) 1000 MG tablet TAKE 2 TABLETS BY MOUTH DAILY WITH SUPPER. 180 tablet 3   metoprolol succinate (TOPROL-XL) 25 MG 24 hr tablet TAKE 1 TABLET BY MOUTH DAILY 90 tablet 3   omeprazole (PRILOSEC) 40 MG capsule TAKE 1 CAPSULE BY MOUTH DAILY BEFORE SUPPER (Patient taking differently: Take 40 mg by mouth every other day.) 90 capsule 2   ondansetron (ZOFRAN ODT) 4 MG disintegrating tablet Take 1 tablet (4 mg total) by mouth every 8 (eight) hours as needed for nausea or vomiting. 20 tablet 0   oxyCODONE (ROXICODONE) 5 MG immediate release tablet Take 1 tablet (5 mg total) by mouth every 4 (four) hours as needed for severe pain (pain score 7-10) or breakthrough pain. 10 tablet 0   OZEMPIC, 0.25 OR 0.5 MG/DOSE, 2 MG/3ML SOPN INJECT 0.5MG  UNDER THE SKIN ONCE A WEEK 9 mL 3   promethazine (PHENERGAN) 25 MG tablet Take 1 tablet (25 mg total) by mouth every 6 (six) hours as needed for nausea. 15 tablet 0   rizatriptan (MAXALT) 10 MG tablet Take 1 tablet (10 mg total) by mouth as needed for migraine. May repeat in 2 hours if needed 10 tablet 8   rosuvastatin (CRESTOR) 5 MG tablet TAKE 1 TABLET(5 MG) BY MOUTH 2 TIMES A WEEK 13 tablet 3   SYRINGE-NEEDLE, DISP, 3 ML 25G X 1" 3 ML MISC Use for monthly B12 injections 12 each 0   triamcinolone cream (KENALOG) 0.1 % Apply 1 application topically 2 (two) times daily as needed (for itching). 28.4 g 1   No current facility-administered medications for this visit.   No results found.  Review of Systems:   A ROS was performed including pertinent positives and negatives as documented in the HPI.   Musculoskeletal Exam:    There were no vitals taken for this  visit.  Right hip incisions are well-appearing without erythema or drainage.  She has 30 degrees internal/external rotation of the right hip without pain.  She is able to perform a straight leg raise on the right.  Walks with mild antalgic gait.  Distal neurosensory exam is intact  Imaging:      I personally reviewed and interpreted the radiographs.   Assessment:   2 weeks status post right hip arthroscopic labral repair overall doing very well.  At this time she will progress her weightbearing as tolerated.  I will plan to see her back in 4 weeks for reassessment  Plan :    -Return to clinic 4 weeks to reassess      I personally saw and evaluated the patient, and participated in the management and treatment plan.  Huel Cote, MD Attending Physician, Orthopedic Surgery  This document was dictated using Dragon voice recognition software. A reasonable attempt at proof reading has been made to minimize errors.

## 2023-07-31 NOTE — Therapy (Signed)
OUTPATIENT PHYSICAL THERAPY TREATMENT   Patient Name: Lisa Duran MRN: [id number] DOB:1980-08-15, 42 y.o., female Today's Date: 07/31/2023  END OF SESSION:  PT End of Session - 07/31/23 1017     Visit Number 3    Number of Visits 22    Date for PT Re-Evaluation 10/10/23    Authorization Type UHC    PT Start Time 1018    PT Stop Time 1056    PT Time Calculation (min) 38 min    Activity Tolerance Patient tolerated treatment well    Behavior During Therapy WFL for tasks assessed/performed             Past Medical History:  Diagnosis Date   ADHD (attention deficit hyperactivity disorder)    ALLERGIC RHINITIS    Allergy    Anemia    ANXIETY    Depression    DIABETES MELLITUS, TYPE II    Fibromyalgia 2018   GERD (gastroesophageal reflux disease)    MIGRAINE, COMMON    Obesity, unspecified    OVARIAN CYST    Past Surgical History:  Procedure Laterality Date   KNEE ARTHROSCOPY Left 08/25/2022   Patient Active Problem List   Diagnosis Date Noted   Tear of right acetabular labrum 07/21/2023   Cellulitis of abdominal wall 12/26/2022   B12 deficiency 05/09/2021   Dyslipidemia 04/14/2019   Enteritis 01/12/2019   Sleep walking disorder 04/06/2017   Iron deficiency anemia 03/27/2017   Fibromyalgia 02/03/2017   GERD (gastroesophageal reflux disease) 06/24/2015   Eczema 06/22/2015   Depression 06/22/2015   Tachycardia 06/16/2014   Food allergy    ADHD (attention deficit hyperactivity disorder) 01/27/2011   Anxiety 02/20/2010   Allergic rhinitis 11/21/2009   OVARIAN CYST 11/21/2009   Diabetes mellitus type 2 with neurological manifestations (HCC) 11/20/2009   Class 2 obesity in adult 11/20/2009   Migraine without aura 11/20/2009    REFERRING PROVIDER:  Huel Cote, MD     REFERRING DIAG: [phone] (ICD-10-CM) - Tear of right acetabular labrum, initial encounter   Post op labral repair  Rationale for Evaluation and Treatment: Rehabilitation  THERAPY  DIAG:  Pain in right hip  Difficulty in walking, not elsewhere classified  Muscle weakness (generalized)  ONSET DATE: 07/21/23   SUBJECTIVE:                                                                                                                                                                                           SUBJECTIVE STATEMENT: Patient states hip is doing alright. HEP going well. Some soreness in hip, using ice machine    PERTINENT HISTORY:  Fibromyalgia, Iron deficiency  PAIN:  Are you having pain? Yes: NPRS scale: Minimal/10 Pain location: Rt hip Pain description: ache Aggravating factors: Activation of hip flexors, sitting too long Relieving factors: stretching and getting weight off of knee  PRECAUTIONS:  None  RED FLAGS: None   WEIGHT BEARING RESTRICTIONS:  Yes touchdown weightbearing for 2 weeks  FALLS:  Has patient fallen in last 6 months? No  OCCUPATION:  Print production planner, has to lift boxes of medications  PLOF:  Independent  PATIENT GOALS:  Walk her dog that is about 100 pounds, hiking   OBJECTIVE:  Note: Objective measures were completed at Evaluation unless otherwise noted.  COGNITIVE STATUS: Within functional limits for tasks assessed   SENSATION: WFL  EDEMA:  No  POSTURE:  No Significant postural limitations  FOTO:  12/14: 42  GAIT:  12/14 Comments: Arrived with bilateral axillary crutches and hip brace with touchdown weightbearing precautions followed      TREATMENT:                                                                                                                              07/31/23 STM to hip flexors, adductors Glute set 5 x 10 second holds TA activation with bent knee fall outs 2 x 10 Bridge 3 x 10 Sidelying clam 2 x 10  Prone hamstring curls 3 x 10 Prone hip ER/IR 2 x 10  Quadruped rocking x 15 Quadruped pelvic tilts x 15   Treatment                            12/14:  See HEP  for exercises discussed Change bandages Passive range of motion Supine leg extended internal and external rotation motion, glutes set to reduce anterior hip discomfort     PATIENT EDUCATION:  Education details: Anatomy of condition, POC, HEP, exercise form/rationale Person educated: Patient Education method: Explanation, Demonstration, Tactile cues, Verbal cues, and Handouts Education comprehension: verbalized understanding, returned demonstration, verbal cues required, and tactile cues required  HOME EXERCISE PROGRAM: [email or web address]-prehab program  New postop program: [email or web address]   ASSESSMENT:  CLINICAL IMPRESSION: Patient progressing very well through labral repair progression. Good mechanics after initial cueing and demonstration. Patient will continue to benefit from physical therapy in order to improve function and reduce impairment.    Patient is a 42 y.o. F who was seen today for physical therapy evaluation and treatment status post right hip labral repair.  At this time she has met short-term range of motion goals and was educated on the importance of reduced hip flexor activation.  She is ambulating well with reduced weightbearing and use of bilateral crutches.    REHAB POTENTIAL: Good  CLINICAL DECISION MAKING: Stable/uncomplicated  EVALUATION COMPLEXITY: Low   GOALS: Goals reviewed with patient? Yes  SHORT TERM GOALS: Target date: 1/7  Pain free flexion to 90 Baseline: Goal status: INITIAL   2.  Demo controlled quad set with  hold Baseline:  Goal status: INITIAL   3.  30s sit to stand without pain or compensation Baseline:  Goal status: INITIAL   4.  SLS 30s without compensation Baseline:  Goal status: INITIAL   LONG TERM GOALS:  Able to demo step up on 6" step with level pelvis and proper form Baseline:  Goal status: INITIAL Week 8 2/4  2.  Will tolerate at least 3 min on elliptical, demonstrating good tolerance to repetitive weight bearing  motion Baseline:  Goal status: INITIAL  Week 8 2/4  3.  Demonstrate proper form in at least 10 continuous lunges without increased pain Baseline:  Goal status: INITIAL  Week 12 3/4  4.  Demonstrate gentle, double and single foot plyometric motions with good proximal form Baseline:  Goal status: INITIAL Week 12 3/4  5.  Patient will demonstrate appropriate proximal control for navigating unstable surfaces and return to hiking Baseline:  Goal status: INITIAL   6.  Patient will be able to walk her dog without limitation by her hip Baseline:  Goal status: INITIAL    PLAN:  PT FREQUENCY: 1-2 visits per week  PT DURATION: POC date  PLANNED INTERVENTIONS: 97164- PT Re-evaluation, 97110-Therapeutic exercises, 97530- Therapeutic activity, 97112- Neuromuscular re-education, 97535- Self Care, 16109- Manual therapy, [phone]- Gait training, [phone]- Aquatic Therapy, Patient/Family education, Balance training, Stair training, Taping, Dry Needling, Joint mobilization, Spinal mobilization, Scar mobilization, Cryotherapy, and Moist heat.  PLAN FOR NEXT SESSION: per protocol   Reola Mosher Mance Vallejo, PT 07/31/2023, 10:18 AM

## 2023-08-02 ENCOUNTER — Other Ambulatory Visit (HOSPITAL_BASED_OUTPATIENT_CLINIC_OR_DEPARTMENT_OTHER): Payer: Self-pay | Admitting: Orthopaedic Surgery

## 2023-08-03 ENCOUNTER — Ambulatory Visit: Payer: 59 | Admitting: Internal Medicine

## 2023-08-07 ENCOUNTER — Ambulatory Visit (HOSPITAL_BASED_OUTPATIENT_CLINIC_OR_DEPARTMENT_OTHER): Payer: 59 | Admitting: Physical Therapy

## 2023-08-07 ENCOUNTER — Encounter (HOSPITAL_BASED_OUTPATIENT_CLINIC_OR_DEPARTMENT_OTHER): Payer: Self-pay | Admitting: Physical Therapy

## 2023-08-07 DIAGNOSIS — M6281 Muscle weakness (generalized): Secondary | ICD-10-CM

## 2023-08-07 DIAGNOSIS — M25551 Pain in right hip: Secondary | ICD-10-CM

## 2023-08-07 DIAGNOSIS — R262 Difficulty in walking, not elsewhere classified: Secondary | ICD-10-CM

## 2023-08-07 NOTE — Therapy (Signed)
OUTPATIENT PHYSICAL THERAPY TREATMENT   Patient Name: Lisa Duran MRN: [id number] DOB:1981-08-07, 42 y.o., female Today's Date: 08/07/2023  END OF SESSION:  PT End of Session - 08/07/23 1020     Visit Number 4    Number of Visits 22    Date for PT Re-Evaluation 10/10/23    Authorization Type UHC    PT Start Time 1020    PT Stop Time 1058    PT Time Calculation (min) 38 min    Activity Tolerance Patient tolerated treatment well    Behavior During Therapy WFL for tasks assessed/performed             Past Medical History:  Diagnosis Date   ADHD (attention deficit hyperactivity disorder)    ALLERGIC RHINITIS    Allergy    Anemia    ANXIETY    Depression    DIABETES MELLITUS, TYPE II    Fibromyalgia 2018   GERD (gastroesophageal reflux disease)    MIGRAINE, COMMON    Obesity, unspecified    OVARIAN CYST    Past Surgical History:  Procedure Laterality Date   KNEE ARTHROSCOPY Left 08/25/2022   Patient Active Problem List   Diagnosis Date Noted   Tear of right acetabular labrum 07/21/2023   Cellulitis of abdominal wall 12/26/2022   B12 deficiency 05/09/2021   Dyslipidemia 04/14/2019   Enteritis 01/12/2019   Sleep walking disorder 04/06/2017   Iron deficiency anemia 03/27/2017   Fibromyalgia 02/03/2017   GERD (gastroesophageal reflux disease) 06/24/2015   Eczema 06/22/2015   Depression 06/22/2015   Tachycardia 06/16/2014   Food allergy    ADHD (attention deficit hyperactivity disorder) 01/27/2011   Anxiety 02/20/2010   Allergic rhinitis 11/21/2009   OVARIAN CYST 11/21/2009   Diabetes mellitus type 2 with neurological manifestations (HCC) 11/20/2009   Class 2 obesity in adult 11/20/2009   Migraine without aura 11/20/2009    REFERRING PROVIDER:  Huel Cote, MD     REFERRING DIAG: [phone] (ICD-10-CM) - Tear of right acetabular labrum, initial encounter   Post op labral repair  Rationale for Evaluation and Treatment: Rehabilitation  THERAPY  DIAG:  Pain in right hip  Difficulty in walking, not elsewhere classified  Muscle weakness (generalized)  ONSET DATE: 07/21/23   SUBJECTIVE:                                                                                                                                                                                           SUBJECTIVE STATEMENT: Patient states the last 2 days she feels like she has more movement. Left knee has been bothering her. Walking without crutches at home and  doing well. Still painful if trying to walk further without crutches. Doing HEP.     PERTINENT HISTORY:  Fibromyalgia, Iron deficiency  PAIN:  Are you having pain? Yes: NPRS scale: Minimal/10 Pain location: Rt hip Pain description: ache Aggravating factors: Activation of hip flexors, sitting too long Relieving factors: stretching and getting weight off of knee  PRECAUTIONS:  None  RED FLAGS: None   WEIGHT BEARING RESTRICTIONS:  Yes touchdown weightbearing for 2 weeks  FALLS:  Has patient fallen in last 6 months? No  OCCUPATION:  Print production planner, has to lift boxes of medications  PLOF:  Independent  PATIENT GOALS:  Walk her dog that is about 100 pounds, hiking   OBJECTIVE:  Note: Objective measures were completed at Evaluation unless otherwise noted.  COGNITIVE STATUS: Within functional limits for tasks assessed   SENSATION: WFL  EDEMA:  No  POSTURE:  No Significant postural limitations  FOTO:  12/14: 42  GAIT:  12/14 Comments: Arrived with bilateral axillary crutches and hip brace with touchdown weightbearing precautions followed      TREATMENT:                                                                                                                              08/07/23 STM to hip flexors, adductors Bridge 3 x 10 Sidelying clam RTB 3 x 10 Prone quad stretch 5 x 20 second holds Prone hamstring curls 2# 3 x 10 Gait with unilateral crutch/  [facility]  07/31/23 STM to hip flexors, adductors Glute set 5 x 10 second holds TA activation with bent knee fall outs 2 x 10 Bridge 3 x 10 Sidelying clam 2 x 10  Prone hamstring curls 3 x 10 Prone hip ER/IR 2 x 10  Quadruped rocking x 15 Quadruped pelvic tilts x 15   Treatment                            12/14:  See HEP for exercises discussed Change bandages Passive range of motion Supine leg extended internal and external rotation motion, glutes set to reduce anterior hip discomfort     PATIENT EDUCATION:  Education details: Anatomy of condition, POC, HEP, exercise form/rationale Person educated: Patient Education method: Explanation, Demonstration, Tactile cues, Verbal cues, and Handouts Education comprehension: verbalized understanding, returned demonstration, verbal cues required, and tactile cues required  HOME EXERCISE PROGRAM: [email or web address]-prehab program  New postop program: [email or web address]   ASSESSMENT:  CLINICAL IMPRESSION: Patient progressing very well through labral repair progression. Good mechanics after initial cueing and demonstration. Began gait with reducing UE support and educated on proper form and progression. Patient will continue to benefit from physical therapy in order to improve function and reduce impairment.    Patient is a 42 y.o. F who was seen today for physical therapy evaluation and treatment status post right hip labral repair.  At this time she has met short-term range of motion  goals and was educated on the importance of reduced hip flexor activation.  She is ambulating well with reduced weightbearing and use of bilateral crutches.    REHAB POTENTIAL: Good  CLINICAL DECISION MAKING: Stable/uncomplicated  EVALUATION COMPLEXITY: Low   GOALS: Goals reviewed with patient? Yes  SHORT TERM GOALS: Target date: 1/7  Pain free flexion to 90 Baseline: Goal status: INITIAL   2.  Demo controlled quad set with hold Baseline:  Goal status: INITIAL    3.  30s sit to stand without pain or compensation Baseline:  Goal status: INITIAL   4.  SLS 30s without compensation Baseline:  Goal status: INITIAL   LONG TERM GOALS:  Able to demo step up on 6" step with level pelvis and proper form Baseline:  Goal status: INITIAL Week 8 2/4  2.  Will tolerate at least 3 min on elliptical, demonstrating good tolerance to repetitive weight bearing motion Baseline:  Goal status: INITIAL  Week 8 2/4  3.  Demonstrate proper form in at least 10 continuous lunges without increased pain Baseline:  Goal status: INITIAL  Week 12 3/4  4.  Demonstrate gentle, double and single foot plyometric motions with good proximal form Baseline:  Goal status: INITIAL Week 12 3/4  5.  Patient will demonstrate appropriate proximal control for navigating unstable surfaces and return to hiking Baseline:  Goal status: INITIAL   6.  Patient will be able to walk her dog without limitation by her hip Baseline:  Goal status: INITIAL    PLAN:  PT FREQUENCY: 1-2 visits per week  PT DURATION: POC date  PLANNED INTERVENTIONS: 97164- PT Re-evaluation, 97110-Therapeutic exercises, 97530- Therapeutic activity, 97112- Neuromuscular re-education, 97535- Self Care, 16109- Manual therapy, [phone]- Gait training, [phone]- Aquatic Therapy, Patient/Family education, Balance training, Stair training, Taping, Dry Needling, Joint mobilization, Spinal mobilization, Scar mobilization, Cryotherapy, and Moist heat.  PLAN FOR NEXT SESSION: per protocol   Reola Mosher Sayeed Weatherall, PT 08/07/2023, 11:01 AM

## 2023-08-14 ENCOUNTER — Encounter (HOSPITAL_BASED_OUTPATIENT_CLINIC_OR_DEPARTMENT_OTHER): Payer: Self-pay | Admitting: Physical Therapy

## 2023-08-14 ENCOUNTER — Ambulatory Visit (HOSPITAL_BASED_OUTPATIENT_CLINIC_OR_DEPARTMENT_OTHER): Payer: 59 | Attending: Orthopaedic Surgery | Admitting: Physical Therapy

## 2023-08-14 DIAGNOSIS — M25551 Pain in right hip: Secondary | ICD-10-CM | POA: Diagnosis present

## 2023-08-14 DIAGNOSIS — M6281 Muscle weakness (generalized): Secondary | ICD-10-CM | POA: Diagnosis present

## 2023-08-14 DIAGNOSIS — R262 Difficulty in walking, not elsewhere classified: Secondary | ICD-10-CM | POA: Insufficient documentation

## 2023-08-14 NOTE — Therapy (Signed)
 OUTPATIENT PHYSICAL THERAPY TREATMENT   Patient Name: Lisa Duran MRN: [id number] DOB:04-13-1981, 43 y.o., female Today's Date: 08/14/2023  END OF SESSION:  PT End of Session - 08/14/23 1019     Visit Number 5    Number of Visits 22    Date for PT Re-Evaluation 10/10/23    Authorization Type U[employer]    PT Start Time 1018    PT Stop Time 1056    PT Time Calculation (min) 38 min    Activity Tolerance Patient tolerated treatment well    Behavior During Therapy WFL for tasks assessed/performed             Past Medical History:  Diagnosis Date   ADHD (attention deficit hyperactivity disorder)    ALLERGIC RHINITIS    Allergy    Anemia    ANXIETY    Depression    DIABETES MELLITUS, TYPE II    Fibromyalgia 2018   GERD (gastroesophageal reflux disease)    MIGRAINE, COMMON    Obesity, unspecified    OVARIAN CYST    Past Surgical History:  Procedure Laterality Date   KNEE ARTHROSCOPY Left 08/25/2022   Patient Active Problem List   Diagnosis Date Noted   Tear of right acetabular labrum 07/21/2023   Cellulitis of abdominal wall 12/26/2022   B12 deficiency 05/09/2021   Dyslipidemia 04/14/2019   Enteritis 01/12/2019   Sleep walking disorder 04/06/2017   Iron  deficiency anemia 03/27/2017   Fibromyalgia 02/03/2017   GERD (gastroesophageal reflux disease) 06/24/2015   Eczema 06/22/2015   Depression 06/22/2015   Tachycardia 06/16/2014   Food allergy    ADHD (attention deficit hyperactivity disorder) 01/27/2011   Anxiety 02/20/2010   Allergic rhinitis 11/21/2009   OVARIAN CYST 11/21/2009   Diabetes mellitus type 2 with neurological manifestations (HCC) 11/20/2009   Class 2 obesity in adult 11/20/2009   Migraine without aura 11/20/2009    REFERRING PROVIDER:  Genelle Standing, MD     REFERRING DIAG: [phone] (ICD-10-CM) - Tear of right acetabular labrum, initial encounter   Post op labral repair  Rationale for Evaluation and Treatment: Rehabilitation  THERAPY DIAG:   Pain in right hip  Difficulty in walking, not elsewhere classified  Muscle weakness (generalized)  ONSET DATE: 07/21/23   SUBJECTIVE:                                                                                                                                                                                           SUBJECTIVE STATEMENT: Patient states clam is still tough. Gradually getting back to more activity. Hip flexor gets irritated with sitting more than 15 minutes  PERTINENT HISTORY:  Fibromyalgia, Iron  deficiency  PAIN:  Are you having pain? Yes: NPRS scale: Minimal/10 Pain location: Rt hip Pain description: ache Aggravating factors: Activation of hip flexors, sitting too long Relieving factors: stretching and getting weight off of knee  PRECAUTIONS:  None  RED FLAGS: None   WEIGHT BEARING RESTRICTIONS:  Yes touchdown weightbearing for 2 weeks  FALLS:  Has patient fallen in last 6 months? No  OCCUPATION:  Print production planner, has to lift boxes of medications  PLOF:  Independent  PATIENT GOALS:  Walk her dog that is about 100 pounds, hiking   OBJECTIVE:  Note: Objective measures were completed at Evaluation unless otherwise noted.  COGNITIVE STATUS: Within functional limits for tasks assessed   SENSATION: WFL  EDEMA:  No  POSTURE:  No Significant postural limitations  FOTO:  12/14: 42  GAIT:  12/14 Comments: Arrived with bilateral axillary crutches and hip brace with touchdown weightbearing precautions followed      TREATMENT:                                                                                                                              08/14/23 Bridge 1 x 10DL, 2 x 10 SL Prone hip extension 3 x 10 Tall kneeling hip hinge 2 x 10 Tall kneeling with shoulder extension GTB 2 x 10  1/2 kneeling glute set with ab set 10 x 10 second holds STS 2 x 10 Gait without AD Weight shifts with glute activation lateral and A/P 1 x  20 each   08/07/23 STM to hip flexors, adductors Bridge 3 x 10 Sidelying clam RTB 3 x 10 Prone quad stretch 5 x 20 second holds Prone hamstring curls 2# 3 x 10 Gait with unilateral crutch/ [facility]  07/31/23 STM to hip flexors, adductors Glute set 5 x 10 second holds TA activation with bent knee fall outs 2 x 10 Bridge 3 x 10 Sidelying clam 2 x 10  Prone hamstring curls 3 x 10 Prone hip ER/IR 2 x 10  Quadruped rocking x 15 Quadruped pelvic tilts x 15   Treatment                            12/14:  See HEP for exercises discussed Change bandages Passive range of motion Supine leg extended internal and external rotation motion, glutes set to reduce anterior hip discomfort     PATIENT EDUCATION:  Education details: Anatomy of condition, POC, HEP, exercise form/rationale Person educated: Patient Education method: Explanation, Demonstration, Tactile cues, Verbal cues, and Handouts Education comprehension: verbalized understanding, returned demonstration, verbal cues required, and tactile cues required  HOME EXERCISE PROGRAM: [email or web address]-prehab program  New postop program: [email or web address]   ASSESSMENT:  CLINICAL IMPRESSION: Patient now over 3 weeks post op and progressing well with new exercises. Fatigue noted after kneeling exercises. Good mechanics with STS. Continued glute weakness evident with gait on R stance. Moderate  to high fatigue at end of session. Patient will continue to benefit from physical therapy in order to improve function and reduce impairment.    Patient is a 43 y.o. F who was seen today for physical therapy evaluation and treatment status post right hip labral repair.  At this time she has met short-term range of motion goals and was educated on the importance of reduced hip flexor activation.  She is ambulating well with reduced weightbearing and use of bilateral crutches.    REHAB POTENTIAL: Good  CLINICAL DECISION MAKING: Stable/uncomplicated  EVALUATION  COMPLEXITY: Low   GOALS: Goals reviewed with patient? Yes  SHORT TERM GOALS: Target date: 1/7  Pain free flexion to 90 Baseline: Goal status: INITIAL   2.  Demo controlled quad set with hold Baseline:  Goal status: INITIAL   3.  30s sit to stand without pain or compensation Baseline:  Goal status: INITIAL   4.  SLS 30s without compensation Baseline:  Goal status: INITIAL   LONG TERM GOALS:  Able to demo step up on 6 step with level pelvis and proper form Baseline:  Goal status: INITIAL Week 8 2/4  2.  Will tolerate at least 3 min on elliptical, demonstrating good tolerance to repetitive weight bearing motion Baseline:  Goal status: INITIAL  Week 8 2/4  3.  Demonstrate proper form in at least 10 continuous lunges without increased pain Baseline:  Goal status: INITIAL  Week 12 3/4  4.  Demonstrate gentle, double and single foot plyometric motions with good proximal form Baseline:  Goal status: INITIAL Week 12 3/4  5.  Patient will demonstrate appropriate proximal control for navigating unstable surfaces and return to hiking Baseline:  Goal status: INITIAL   6.  Patient will be able to walk her dog without limitation by her hip Baseline:  Goal status: INITIAL    PLAN:  PT FREQUENCY: 1-2 visits per week  PT DURATION: POC date  PLANNED INTERVENTIONS: 97164- PT Re-evaluation, 97110-Therapeutic exercises, 97530- Therapeutic activity, 97112- Neuromuscular re-education, 97535- Self Care, 02859- Manual therapy, [phone]- Gait training, [phone]- Aquatic Therapy, Patient/Family education, Balance training, Stair training, Taping, Dry Needling, Joint mobilization, Spinal mobilization, Scar mobilization, Cryotherapy, and Moist heat.  PLAN FOR NEXT SESSION: per protocol   Prentice GORMAN Stains, PT 08/14/2023, 10:57 AM

## 2023-08-17 ENCOUNTER — Encounter (HOSPITAL_BASED_OUTPATIENT_CLINIC_OR_DEPARTMENT_OTHER): Payer: Self-pay | Admitting: Physical Therapy

## 2023-08-17 ENCOUNTER — Ambulatory Visit (HOSPITAL_BASED_OUTPATIENT_CLINIC_OR_DEPARTMENT_OTHER): Payer: 59 | Admitting: Physical Therapy

## 2023-08-17 DIAGNOSIS — M25551 Pain in right hip: Secondary | ICD-10-CM

## 2023-08-17 DIAGNOSIS — R262 Difficulty in walking, not elsewhere classified: Secondary | ICD-10-CM

## 2023-08-17 NOTE — Therapy (Signed)
 OUTPATIENT PHYSICAL THERAPY TREATMENT   Patient Name: Lisa Duran MRN: [id number] DOB:11/15/1980, 43 y.o., female Today's Date: 08/17/2023  END OF SESSION:  PT End of Session - 08/17/23 1449     Visit Number 6    Number of Visits 22    Date for PT Re-Evaluation 10/10/23    Authorization Type UHC    PT Start Time 1447    PT Stop Time 1531    PT Time Calculation (min) 44 min    Activity Tolerance Patient tolerated treatment well    Behavior During Therapy WFL for tasks assessed/performed              Past Medical History:  Diagnosis Date   ADHD (attention deficit hyperactivity disorder)    ALLERGIC RHINITIS    Allergy    Anemia    ANXIETY    Depression    DIABETES MELLITUS, TYPE II    Fibromyalgia 2018   GERD (gastroesophageal reflux disease)    MIGRAINE, COMMON    Obesity, unspecified    OVARIAN CYST    Past Surgical History:  Procedure Laterality Date   KNEE ARTHROSCOPY Left 08/25/2022   Patient Active Problem List   Diagnosis Date Noted   Tear of right acetabular labrum 07/21/2023   Cellulitis of abdominal wall 12/26/2022   B12 deficiency 05/09/2021   Dyslipidemia 04/14/2019   Enteritis 01/12/2019   Sleep walking disorder 04/06/2017   Iron  deficiency anemia 03/27/2017   Fibromyalgia 02/03/2017   GERD (gastroesophageal reflux disease) 06/24/2015   Eczema 06/22/2015   Depression 06/22/2015   Tachycardia 06/16/2014   Food allergy    ADHD (attention deficit hyperactivity disorder) 01/27/2011   Anxiety 02/20/2010   Allergic rhinitis 11/21/2009   OVARIAN CYST 11/21/2009   Diabetes mellitus type 2 with neurological manifestations (HCC) 11/20/2009   Class 2 obesity in adult 11/20/2009   Migraine without aura 11/20/2009    REFERRING PROVIDER:  Genelle Standing, MD     REFERRING DIAG: [phone] (ICD-10-CM) - Tear of right acetabular labrum, initial encounter   Post op labral repair  Rationale for Evaluation and Treatment: Rehabilitation  THERAPY  DIAG:  Pain in right hip  Difficulty in walking, not elsewhere classified  ONSET DATE: 07/21/23   SUBJECTIVE:                                                                                                                                                                                           SUBJECTIVE STATEMENT: Went on a work retreat and was actually able to sit for longer. Still gets sore the more I walk. I am using my left knee.  PERTINENT HISTORY:  Fibromyalgia, Iron  deficiency  PAIN:  Are you having pain? Yes: NPRS scale: Minimal/10 Pain location: Rt hip Pain description: ache Aggravating factors: Activation of hip flexors, sitting too long Relieving factors: stretching and getting weight off of knee  PRECAUTIONS:  None  RED FLAGS: None   WEIGHT BEARING RESTRICTIONS:  Yes touchdown weightbearing for 2 weeks  FALLS:  Has patient fallen in last 6 months? No  OCCUPATION:  Print production planner, has to lift boxes of medications  PLOF:  Independent  PATIENT GOALS:  Walk her dog that is about 100 pounds, hiking   OBJECTIVE:  Note: Objective measures were completed at Evaluation unless otherwise noted.  COGNITIVE STATUS: Within functional limits for tasks assessed   SENSATION: WFL  EDEMA:  No  POSTURE:  No Significant postural limitations  FOTO:  12/14: 42  GAIT:  12/14 Comments: Arrived with bilateral axillary crutches and hip brace with touchdown weightbearing precautions followed      TREATMENT:                                                                                                                              Treatment                            08/17/23:  Gait training- pressing Rt ASIS anteriorly rather than opening laterally at toe off Sidelying hip abd x10, circles x10   x2 Clam & reverse Prone hip ext + HS curl in extension Roller to ITB & lateral HS, quads- edu in self rolling Half kneel + Y,progressed yellow tband with  fatigue Tall kneel + lat pull; + post resistance   08/14/23 Bridge 1 x 10DL, 2 x 10 SL Prone hip extension 3 x 10 Tall kneeling hip hinge 2 x 10 Tall kneeling with shoulder extension GTB 2 x 10  1/2 kneeling glute set with ab set 10 x 10 second holds STS 2 x 10 Gait without AD Weight shifts with glute activation lateral and A/P 1 x 20 each   08/07/23 STM to hip flexors, adductors Bridge 3 x 10 Sidelying clam RTB 3 x 10 Prone quad stretch 5 x 20 second holds Prone hamstring curls 2# 3 x 10 Gait with unilateral crutch/ SPC     PATIENT EDUCATION:  Education details: Teacher, Music of condition, POC, HEP, exercise form/rationale Person educated: Patient Education method: Explanation, Demonstration, Tactile cues, Verbal cues, and Handouts Education comprehension: verbalized understanding, returned demonstration, verbal cues required, and tactile cues required  HOME EXERCISE PROGRAM: [email or web address]-prehab program  New postop program: [email or web address]   ASSESSMENT:  CLINICAL IMPRESSION: Progressed resistance in tall kneeling position. Requested that gait pattern is primary focus at this time. Returns to work tomorrow and encouraged regular movement.     Patient is a 43 y.o. F who was seen today for physical therapy evaluation and treatment status post right hip labral repair.  At this time  she has met short-term range of motion goals and was educated on the importance of reduced hip flexor activation.  She is ambulating well with reduced weightbearing and use of bilateral crutches.    REHAB POTENTIAL: Good  CLINICAL DECISION MAKING: Stable/uncomplicated  EVALUATION COMPLEXITY: Low   GOALS: Goals reviewed with patient? Yes  SHORT TERM GOALS: Target date: 1/7  Pain free flexion to 90 Baseline: Goal status: INITIAL   2.  Demo controlled quad set with hold Baseline:  Goal status: INITIAL   3.  30s sit to stand without pain or compensation Baseline:  Goal status: INITIAL   4.   SLS 30s without compensation Baseline:  Goal status: INITIAL   LONG TERM GOALS:  Able to demo step up on 6 step with level pelvis and proper form Baseline:  Goal status: INITIAL Week 8 2/4  2.  Will tolerate at least 3 min on elliptical, demonstrating good tolerance to repetitive weight bearing motion Baseline:  Goal status: INITIAL  Week 8 2/4  3.  Demonstrate proper form in at least 10 continuous lunges without increased pain Baseline:  Goal status: INITIAL  Week 12 3/4  4.  Demonstrate gentle, double and single foot plyometric motions with good proximal form Baseline:  Goal status: INITIAL Week 12 3/4  5.  Patient will demonstrate appropriate proximal control for navigating unstable surfaces and return to hiking Baseline:  Goal status: INITIAL   6.  Patient will be able to walk her dog without limitation by her hip Baseline:  Goal status: INITIAL    PLAN:  PT FREQUENCY: 1-2 visits per week  PT DURATION: POC date  PLANNED INTERVENTIONS: 97164- PT Re-evaluation, 97110-Therapeutic exercises, 97530- Therapeutic activity, 97112- Neuromuscular re-education, 97535- Self Care, 02859- Manual therapy, [phone]- Gait training, [phone]- Aquatic Therapy, Patient/Family education, Balance training, Stair training, Taping, Dry Needling, Joint mobilization, Spinal mobilization, Scar mobilization, Cryotherapy, and Moist heat.  PLAN FOR NEXT SESSION: per protocol  Geoge Lawrance C. Maddon Horton PT, DPT 08/17/23 3:32 PM

## 2023-08-18 ENCOUNTER — Encounter (HOSPITAL_BASED_OUTPATIENT_CLINIC_OR_DEPARTMENT_OTHER): Payer: Self-pay

## 2023-08-18 ENCOUNTER — Encounter (HOSPITAL_BASED_OUTPATIENT_CLINIC_OR_DEPARTMENT_OTHER): Payer: 59 | Admitting: Physical Therapy

## 2023-08-21 ENCOUNTER — Encounter (HOSPITAL_BASED_OUTPATIENT_CLINIC_OR_DEPARTMENT_OTHER): Payer: Self-pay | Admitting: Physical Therapy

## 2023-08-21 ENCOUNTER — Ambulatory Visit (HOSPITAL_BASED_OUTPATIENT_CLINIC_OR_DEPARTMENT_OTHER): Payer: 59 | Admitting: Physical Therapy

## 2023-08-21 DIAGNOSIS — M6281 Muscle weakness (generalized): Secondary | ICD-10-CM

## 2023-08-21 DIAGNOSIS — M25551 Pain in right hip: Secondary | ICD-10-CM | POA: Diagnosis not present

## 2023-08-21 DIAGNOSIS — R262 Difficulty in walking, not elsewhere classified: Secondary | ICD-10-CM

## 2023-08-21 NOTE — Therapy (Signed)
 OUTPATIENT PHYSICAL THERAPY TREATMENT   Patient Name: Lisa Duran MRN: [id number] DOB:1981-02-04, 43 y.o., female Today's Date: 08/21/2023  END OF SESSION:  PT End of Session - 08/21/23 0935     Visit Number 7    Number of Visits 22    Date for PT Re-Evaluation 10/10/23    Authorization Type UHC    PT Start Time 0935    PT Stop Time 1013    PT Time Calculation (min) 38 min    Activity Tolerance Patient tolerated treatment well    Behavior During Therapy WFL for tasks assessed/performed              Past Medical History:  Diagnosis Date   ADHD (attention deficit hyperactivity disorder)    ALLERGIC RHINITIS    Allergy    Anemia    ANXIETY    Depression    DIABETES MELLITUS, TYPE II    Fibromyalgia 2018   GERD (gastroesophageal reflux disease)    MIGRAINE, COMMON    Obesity, unspecified    OVARIAN CYST    Past Surgical History:  Procedure Laterality Date   KNEE ARTHROSCOPY Left 08/25/2022   Patient Active Problem List   Diagnosis Date Noted   Tear of right acetabular labrum 07/21/2023   Cellulitis of abdominal wall 12/26/2022   B12 deficiency 05/09/2021   Dyslipidemia 04/14/2019   Enteritis 01/12/2019   Sleep walking disorder 04/06/2017   Iron  deficiency anemia 03/27/2017   Fibromyalgia 02/03/2017   GERD (gastroesophageal reflux disease) 06/24/2015   Eczema 06/22/2015   Depression 06/22/2015   Tachycardia 06/16/2014   Food allergy    ADHD (attention deficit hyperactivity disorder) 01/27/2011   Anxiety 02/20/2010   Allergic rhinitis 11/21/2009   OVARIAN CYST 11/21/2009   Diabetes mellitus type 2 with neurological manifestations (HCC) 11/20/2009   Class 2 obesity in adult 11/20/2009   Migraine without aura 11/20/2009    REFERRING PROVIDER:  Genelle Standing, MD     REFERRING DIAG: [phone] (ICD-10-CM) - Tear of right acetabular labrum, initial encounter   Post op labral repair  Rationale for Evaluation and Treatment: Rehabilitation  THERAPY  DIAG:  Pain in right hip  Difficulty in walking, not elsewhere classified  Muscle weakness (generalized)  ONSET DATE: 07/21/23   SUBJECTIVE:                                                                                                                                                                                           SUBJECTIVE STATEMENT: Patient states a little sore today from fibromyalgia symptoms. Feeling weaker today. Last session was a lot of work, in a good way. Focusing  on gait mechanics.    PERTINENT HISTORY:  Fibromyalgia, Iron  deficiency  PAIN:  Are you having pain? Yes: NPRS scale: Minimal/10 Pain location: Rt hip Pain description: ache Aggravating factors: Activation of hip flexors, sitting too long Relieving factors: stretching and getting weight off of knee  PRECAUTIONS:  None  RED FLAGS: None   WEIGHT BEARING RESTRICTIONS:  Yes touchdown weightbearing for 2 weeks  FALLS:  Has patient fallen in last 6 months? No  OCCUPATION:  Print production planner, has to lift boxes of medications  PLOF:  Independent  PATIENT GOALS:  Walk her dog that is about 100 pounds, hiking   OBJECTIVE:  Note: Objective measures were completed at Evaluation unless otherwise noted.  COGNITIVE STATUS: Within functional limits for tasks assessed   SENSATION: WFL  EDEMA:  No  POSTURE:  No Significant postural limitations  FOTO:  12/14: 42  GAIT:  12/14 Comments: Arrived with bilateral axillary crutches and hip brace with touchdown weightbearing precautions followed      TREATMENT:                                                                                                                              08/21/23 Sidelying hip abduction 2 x 10, circles CW/CCW 2 x 10 each Sidelying reverse clams 2 x 10 Tall kneeling hip hinge 2 x 10 1/2 kneeling glute/ab sets with overhead press 3# 2 x 10, with diagonal lift 3# 2 x 10  Squat chair taps 2 x 10    Treatment                             08/17/23:  Gait training- pressing Rt ASIS anteriorly rather than opening laterally at toe off Sidelying hip abd x10, circles x10   x2 Clam & reverse Prone hip ext + HS curl in extension Roller to ITB & lateral HS, quads- edu in self rolling Half kneel + Y,progressed yellow tband with fatigue Tall kneel + lat pull; + post resistance   08/14/23 Bridge 1 x 10DL, 2 x 10 SL Prone hip extension 3 x 10 Tall kneeling hip hinge 2 x 10 Tall kneeling with shoulder extension GTB 2 x 10  1/2 kneeling glute set with ab set 10 x 10 second holds STS 2 x 10 Gait without AD Weight shifts with glute activation lateral and A/P 1 x 20 each   08/07/23 STM to hip flexors, adductors Bridge 3 x 10 Sidelying clam RTB 3 x 10 Prone quad stretch 5 x 20 second holds Prone hamstring curls 2# 3 x 10 Gait with unilateral crutch/ SPC     PATIENT EDUCATION:  Education details: Teacher, Music of condition, POC, HEP, exercise form/rationale Person educated: Patient Education method: Explanation, Demonstration, Tactile cues, Verbal cues, and Handouts Education comprehension: verbalized understanding, returned demonstration, verbal cues required, and tactile cues required  HOME EXERCISE PROGRAM: [email or web address]-prehab program  New postop program: [facility]  ASSESSMENT:  CLINICAL IMPRESSION: Continued with glute strengthening and hip flexor elongation. Increased fatigue overall today, intermittent rest breaks during session. Intermittent cuieng for pelvic alignment and glute/core activation. Patient will continue to benefit from physical therapy in order to improve function and reduce impairment.     Patient is a 43 y.o. F who was seen today for physical therapy evaluation and treatment status post right hip labral repair.  At this time she has met short-term range of motion goals and was educated on the importance of reduced hip flexor activation.  She is ambulating well with reduced  weightbearing and use of bilateral crutches.    REHAB POTENTIAL: Good  CLINICAL DECISION MAKING: Stable/uncomplicated  EVALUATION COMPLEXITY: Low   GOALS: Goals reviewed with patient? Yes  SHORT TERM GOALS: Target date: 1/7  Pain free flexion to 90 Baseline: Goal status: INITIAL   2.  Demo controlled quad set with hold Baseline:  Goal status: INITIAL   3.  30s sit to stand without pain or compensation Baseline:  Goal status: INITIAL   4.  SLS 30s without compensation Baseline:  Goal status: INITIAL   LONG TERM GOALS:  Able to demo step up on 6 step with level pelvis and proper form Baseline:  Goal status: INITIAL Week 8 2/4  2.  Will tolerate at least 3 min on elliptical, demonstrating good tolerance to repetitive weight bearing motion Baseline:  Goal status: INITIAL  Week 8 2/4  3.  Demonstrate proper form in at least 10 continuous lunges without increased pain Baseline:  Goal status: INITIAL  Week 12 3/4  4.  Demonstrate gentle, double and single foot plyometric motions with good proximal form Baseline:  Goal status: INITIAL Week 12 3/4  5.  Patient will demonstrate appropriate proximal control for navigating unstable surfaces and return to hiking Baseline:  Goal status: INITIAL   6.  Patient will be able to walk her dog without limitation by her hip Baseline:  Goal status: INITIAL    PLAN:  PT FREQUENCY: 1-2 visits per week  PT DURATION: POC date  PLANNED INTERVENTIONS: 97164- PT Re-evaluation, 97110-Therapeutic exercises, 97530- Therapeutic activity, 97112- Neuromuscular re-education, 97535- Self Care, 02859- Manual therapy, [phone]- Gait training, [phone]- Aquatic Therapy, Patient/Family education, Balance training, Stair training, Taping, Dry Needling, Joint mobilization, Spinal mobilization, Scar mobilization, Cryotherapy, and Moist heat.  PLAN FOR NEXT SESSION: per protocol   Prentice RAMAN Bette Brienza, PT 08/21/2023, 10:13 AM

## 2023-08-25 ENCOUNTER — Ambulatory Visit (HOSPITAL_BASED_OUTPATIENT_CLINIC_OR_DEPARTMENT_OTHER): Payer: 59 | Admitting: Physical Therapy

## 2023-08-25 ENCOUNTER — Encounter (HOSPITAL_BASED_OUTPATIENT_CLINIC_OR_DEPARTMENT_OTHER): Payer: Self-pay

## 2023-08-28 ENCOUNTER — Encounter (HOSPITAL_BASED_OUTPATIENT_CLINIC_OR_DEPARTMENT_OTHER): Payer: Self-pay | Admitting: Physical Therapy

## 2023-08-28 ENCOUNTER — Ambulatory Visit (HOSPITAL_BASED_OUTPATIENT_CLINIC_OR_DEPARTMENT_OTHER): Payer: 59 | Admitting: Physical Therapy

## 2023-08-28 ENCOUNTER — Ambulatory Visit (HOSPITAL_BASED_OUTPATIENT_CLINIC_OR_DEPARTMENT_OTHER): Payer: 59 | Admitting: Orthopaedic Surgery

## 2023-08-28 DIAGNOSIS — M25551 Pain in right hip: Secondary | ICD-10-CM

## 2023-08-28 DIAGNOSIS — R262 Difficulty in walking, not elsewhere classified: Secondary | ICD-10-CM

## 2023-08-28 DIAGNOSIS — M6281 Muscle weakness (generalized): Secondary | ICD-10-CM

## 2023-08-28 DIAGNOSIS — S73191A Other sprain of right hip, initial encounter: Secondary | ICD-10-CM

## 2023-08-28 NOTE — Therapy (Signed)
OUTPATIENT PHYSICAL THERAPY TREATMENT   Patient Name: Lisa Duran MRN: [id number] DOB:05/10/1981, 43 y.o., female Today's Date: 08/28/2023  Progress Note   Reporting Period 07/25/23 to 08/28/23   See note below for Objective Data and Assessment of Progress/Goals   END OF SESSION:  PT End of Session - 08/28/23 0926     Visit Number 8    Number of Visits 22    Date for PT Re-Evaluation 10/10/23    Authorization Type [employer]    PT Start Time 4098    PT Stop Time 1010    PT Time Calculation (min) 42 min    Activity Tolerance Patient tolerated treatment well    Behavior During Therapy [facility] for tasks assessed/performed              Past Medical History:  Diagnosis Date   ADHD (attention deficit hyperactivity disorder)    ALLERGIC RHINITIS    Allergy    Anemia    ANXIETY    Depression    DIABETES MELLITUS, TYPE II    Fibromyalgia 2018   GERD (gastroesophageal reflux disease)    MIGRAINE, COMMON    Obesity, unspecified    OVARIAN CYST    Past Surgical History:  Procedure Laterality Date   KNEE ARTHROSCOPY Left 08/25/2022   Patient Active Problem List   Diagnosis Date Noted   Tear of right acetabular labrum 07/21/2023   Cellulitis of abdominal wall 12/26/2022   B12 deficiency 05/09/2021   Dyslipidemia 04/14/2019   Enteritis 01/12/2019   Sleep walking disorder 04/06/2017   Iron deficiency anemia 03/27/2017   Fibromyalgia 02/03/2017   GERD (gastroesophageal reflux disease) 06/24/2015   Eczema 06/22/2015   Depression 06/22/2015   Tachycardia 06/16/2014   Food allergy    ADHD (attention deficit hyperactivity disorder) 01/27/2011   Anxiety 02/20/2010   Allergic rhinitis 11/21/2009   OVARIAN CYST 11/21/2009   Diabetes mellitus type 2 with neurological manifestations (HCC) 11/20/2009   Class 2 obesity in adult 11/20/2009   Migraine without aura 11/20/2009    REFERRING PROVIDER:  Huel Cote, MD     REFERRING DIAG: [id number] (ICD-10-CM) - Tear of right  acetabular labrum, initial encounter   Post op labral repair  Rationale for Evaluation and Treatment: Rehabilitation  THERAPY DIAG:  Pain in right hip  Difficulty in walking, not elsewhere classified  Muscle weakness (generalized)  ONSET DATE: 07/21/23   SUBJECTIVE:                                                                                                                                                                                           SUBJECTIVE STATEMENT: Patient  states doing better than earlier this week. Hip feeling good. Tight in hip flexor from driving. Glutes have been sore.    PERTINENT HISTORY:  Fibromyalgia, Iron deficiency  PAIN:  Are you having pain? Yes: NPRS scale: Minimal/10 Pain location: Rt hip Pain description: ache Aggravating factors: Activation of hip flexors, sitting too long Relieving factors: stretching and getting weight off of knee  PRECAUTIONS:  None  RED FLAGS: None   WEIGHT BEARING RESTRICTIONS:  Yes touchdown weightbearing for 2 weeks  FALLS:  Has patient fallen in last 6 months? No  OCCUPATION:  Print production planner, has to lift boxes of medications  PLOF:  Independent  PATIENT GOALS:  Walk her dog that is about 100 pounds, hiking   OBJECTIVE:  Note: Objective measures were completed at Evaluation unless otherwise noted.  COGNITIVE STATUS: Within functional limits for tasks assessed   SENSATION: WFL  EDEMA:  No  POSTURE:  No Significant postural limitations  FOTO:  12/14: 42 08/28/23: 72 % function  GAIT:  12/14 Comments: Arrived with bilateral axillary crutches and hip brace with touchdown weightbearing precautions followed   LOWER EXTREMITY  ROM:  08/28/23 WFL all hip planes  Active ROM Right 08/28/2023 Left 08/28/2023  Hip flexion    Hip extension    Hip abduction    Hip adduction    Hip internal rotation    Hip external rotation    Knee flexion    Knee extension    Ankle dorsiflexion     Ankle plantarflexion    Ankle inversion    Ankle eversion     (Blank rows = not tested)*= pain  LOWER EXTREMITY MMT:  MMT Right 08/28/2023 Left 08/28/2023  Hip flexion 55.4 52.6  Hip extension 60.7 62.2  Hip abduction 56.6 61.7  Hip adduction    Hip internal rotation    Hip external rotation    Knee flexion 44.4 55.5  Knee extension 70.8 70.6  Ankle dorsiflexion    Ankle plantarflexion    Ankle inversion    Ankle eversion     (Blank rows = not tested) *= pain  Reassessment 08/28/23 30 second STS 22 Reps without compensation SLS:  >40 seconds on RLE with good pelvic control  TREATMENT:                                                                                                                              08/28/23 Reassessment  Standing hip hinge 3 x 10 STS with RLE bias 2 x 10  Lateral stepping 4 x 10 feet x 2  08/21/23 Sidelying hip abduction 2 x 10, circles CW/CCW 2 x 10 each Sidelying reverse clams 2 x 10 Tall kneeling hip hinge 2 x 10 1/2 kneeling glute/ab sets with overhead press 3# 2 x 10, with diagonal lift 3# 2 x 10  Squat chair taps 2 x 10    Treatment  08/17/23:  Gait training- pressing Rt ASIS anteriorly rather than opening laterally at toe off Sidelying hip abd x10, circles x10   x2 Clam & reverse Prone hip ext + HS curl in extension Roller to ITB & lateral HS, quads- edu in self rolling Half kneel + Y,progressed yellow tband with fatigue Tall kneel + lat pull; + post resistance   08/14/23 Bridge 1 x 10DL, 2 x 10 SL Prone hip extension 3 x 10 Tall kneeling hip hinge 2 x 10 Tall kneeling with shoulder extension GTB 2 x 10  1/2 kneeling glute set with ab set 10 x 10 second holds STS 2 x 10 Gait without AD Weight shifts with glute activation lateral and A/P 1 x 20 each   08/07/23 STM to hip flexors, adductors Bridge 3 x 10 Sidelying clam RTB 3 x 10 Prone quad stretch 5 x 20 second holds Prone hamstring curls 2# 3 x  10 Gait with unilateral crutch/ SPC     PATIENT EDUCATION:  Education details: Anatomy of condition, POC, HEP, exercise form/rationale; 08/28/23: HEP, reassessment findings Person educated: Patient Education method: Explanation, Demonstration, Tactile cues, Verbal cues, and Handouts Education comprehension: verbalized understanding, returned demonstration, verbal cues required, and tactile cues required  HOME EXERCISE PROGRAM: [email or web address]-prehab program  New postop program: [email or web address]   ASSESSMENT:  CLINICAL IMPRESSION: Patient has met 4/4 short term goals and 0/6 long term goals with ability to complete HEP and improvement in symptoms, strength, ROM, activity tolerance, gait, balance, and functional mobility. Remaining goals not met due to continued deficits in strength, functional mobility, gait, activity tolerance but she has made made great progress toward remaining goals.  Patient will continue to benefit from skilled physical therapy in order to improve function and reduce impairment.      Patient is a 43 y.o. F who was seen today for physical therapy evaluation and treatment status post right hip labral repair.  At this time she has met short-term range of motion goals and was educated on the importance of reduced hip flexor activation.  She is ambulating well with reduced weightbearing and use of bilateral crutches.    REHAB POTENTIAL: Good  CLINICAL DECISION MAKING: Stable/uncomplicated  EVALUATION COMPLEXITY: Low   GOALS: Goals reviewed with patient? Yes  SHORT TERM GOALS: Target date: 1/7  Pain free flexion to 90 Baseline: Goal status: MET  2.  Demo controlled quad set with hold Baseline:  Goal status: MET  3.  30s sit to stand without pain or compensation Baseline:  22 reps Goal status: MET  4.  SLS 30s without compensation Baseline:  Goal status: MET  LONG TERM GOALS:  Able to demo step up on 6" step with level pelvis and proper form Baseline:  Goal  status: INITIAL Week 8 2/4  2.  Will tolerate at least 3 min on elliptical, demonstrating good tolerance to repetitive weight bearing motion Baseline:  Goal status: INITIAL  Week 8 2/4  3.  Demonstrate proper form in at least 10 continuous lunges without increased pain Baseline:  Goal status: INITIAL  Week 12 3/4  4.  Demonstrate gentle, double and single foot plyometric motions with good proximal form Baseline:  Goal status: INITIAL Week 12 3/4  5.  Patient will demonstrate appropriate proximal control for navigating unstable surfaces and return to hiking Baseline:  Goal status: INITIAL   6.  Patient will be able to walk her dog without limitation by her hip Baseline:  Goal status: INITIAL    PLAN:  PT FREQUENCY: 1-2 visits per week  PT DURATION: POC date  PLANNED INTERVENTIONS: 97164- PT Re-evaluation, 97110-Therapeutic exercises, 97530- Therapeutic activity, 97112- Neuromuscular re-education, 97535- Self Care, 16109- Manual therapy, [phone]- Gait training, [phone]- Aquatic Therapy, Patient/Family education, Balance training, Stair training, Taping, Dry Needling, Joint mobilization, Spinal mobilization, Scar mobilization, Cryotherapy, and Moist heat.  PLAN FOR NEXT SESSION: per protocol   Wyman Songster, PT 08/28/2023, 9:27 AM

## 2023-08-28 NOTE — Progress Notes (Signed)
Post Operative Evaluation    Procedure/Date of Surgery: 12/10 right hip labral repair  Interval History:   Status post right hip arthroscopic labral repair overall doing extremely well.  She is now walking only occasionally with 1 crutch for longer distances.  Overall she is doing extremely well with no pain.  She is very happy with her surgical outcome   PMH/PSH/Family History/Social History/Meds/Allergies:    Past Medical History:  Diagnosis Date   ADHD (attention deficit hyperactivity disorder)    ALLERGIC RHINITIS    Allergy    Anemia    ANXIETY    Depression    DIABETES MELLITUS, TYPE II    Fibromyalgia 2018   GERD (gastroesophageal reflux disease)    MIGRAINE, COMMON    Obesity, unspecified    OVARIAN CYST    Past Surgical History:  Procedure Laterality Date   KNEE ARTHROSCOPY Left 08/25/2022   Social History   Socioeconomic History   Marital status: Married    Spouse name: Not on file   Number of children: Not on file   Years of education: Not on file   Highest education level: Bachelor's degree (e.g., BA, AB, BS)  Occupational History   Not on file  Tobacco Use   Smoking status: Former    Current packs/day: 0.00    Types: Cigarettes    Quit date: 08/20/2002    Years since quitting: 21.0   Smokeless tobacco: Never   Tobacco comments:    single, separated from spouse 02/2010.   Vaping Use   Vaping status: Never Used  Substance and Sexual Activity   Alcohol use: Yes    Comment: couple of drinks a week   Drug use: No   Sexual activity: Not Currently    Birth control/protection: I.U.D.  Other Topics Concern   Not on file  Social History Narrative   Single, separated from spouse 02/2010   Social Drivers of Health   Financial Resource Strain: Low Risk  (11/18/2022)   Overall Financial Resource Strain (CARDIA)    Difficulty of Paying Living Expenses: Not very hard  Food Insecurity: No Food Insecurity (11/18/2022)    Hunger Vital Sign    Worried About Running Out of Food in the Last Year: Never true    Ran Out of Food in the Last Year: Never true  Transportation Needs: No Transportation Needs (11/18/2022)   PRAPARE - Administrator, Civil Service (Medical): No    Lack of Transportation (Non-Medical): No  Physical Activity: Unknown (11/18/2022)   Exercise Vital Sign    Days of Exercise per Week: Patient declined    Minutes of Exercise per Session: Not on file  Stress: Stress Concern Present (11/18/2022)   [employer] of Occupational Health - Occupational Stress Questionnaire    Feeling of Stress : To some extent  Social Connections: Socially Integrated (11/18/2022)   Social Connection and Isolation Panel [NHANES]    Frequency of Communication with Friends and Family: Twice a week    Frequency of Social Gatherings with Friends and Family: Once a week    Attends Religious Services: More than 4 times per year    Active Member of [employer] or Organizations: Yes    Attends Engineer, structural: More than 4 times per year    Marital Status: Living with partner  Family History  Problem Relation Age of Onset   Diabetes Mother    Hyperlipidemia Mother    CAD Mother    CVA Mother    Diabetes Father    Hyperlipidemia Father    Coronary artery disease Father    Hypertension Father    Cancer Father        esoph   Stroke Father    Cancer Maternal Grandfather 13       Prostate and Lung   Diabetes Sister        Gestational   Graves' disease Maternal Aunt    Cancer Maternal Uncle        Brain   Stroke Paternal Grandmother    Heart disease Paternal Grandmother    Stroke Paternal Grandfather    Heart disease Paternal Grandfather    Diabetes Sister        Gestational and Type II   Hashimoto's thyroiditis Cousin    Cancer Maternal Uncle        Brain   Allergies  Allergen Reactions   Chicken Allergy Anaphylaxis and Shortness Of Breath   Covid-19 Mrna Vacc (Moderna) Anaphylaxis    Other Itching, Swelling and Other (See Comments)    NUTS (of ANY kind): Lips and mouth swell, but no breathing impairment & migraines and eczema are triggered   Propofol Shortness Of Breath    SOB, chest tightness, wheezing after colonoscopy on 09-18-15  Received propofol for surgery 07/21/23 without complication   Basaglar Stephanie Coup [Insulin Glargine] Palpitations and Hypertension   Adhesive [Tape] Other (See Comments)    Makes the skin VERY RED    Gluten Meal Diarrhea   Peanut-Containing Drug Products Itching, Swelling and Other (See Comments)    Lips and mouth swell, but no breathing impairment & migraines and eczema are triggered    Novolog [Insulin Aspart (Human Analog)] Rash   Evaristo Bury [Insulin Degludec] Rash    eczema   Victoza [Liraglutide] Rash   Current Outpatient Medications  Medication Sig Dispense Refill   ALPRAZolam (XANAX) 0.5 MG tablet TAKE 1 TABLET(0.5 MG) BY MOUTH THREE TIMES DAILY AS NEEDED FOR ANXIETY 60 tablet 5   amphetamine-dextroamphetamine (ADDERALL) 20 MG tablet Take 1 tablet (20 mg total) by mouth 2 (two) times daily. 180 tablet 0   aspirin EC 325 MG tablet Take 1 tablet (325 mg total) by mouth daily. 14 tablet 0   Continuous Glucose Sensor (FREESTYLE LIBRE 3 SENSOR) MISC 1 each by Does not apply route every 14 (fourteen) days. 6 each 3   cyanocobalamin (VITAMIN B12) 1000 MCG/ML injection Inject 1 mL (1,000 mcg total) into the muscle every 30 (thirty) days. 10 mL 1   EPINEPHrine 0.3 mg/0.3 mL IJ SOAJ injection Use as needed for hypersensitivity reaction 1 each 0   Fexofenadine HCl (ALLEGRA ALLERGY PO) Take 1 tablet by mouth daily as needed (Allergies).     glucose blood (ONETOUCH VERIO) test strip Use 2x a day 200 each 3   ibuprofen (ADVIL) 800 MG tablet TAKE 1 TABLET( 800 MG TOTAL) BY MOUTH EVERY 8 HOURS FOR 10 DAYS. TAKE WITH FOOD, ALTERNATE WITH ACETAMINOPHEN 30 tablet 0   Insulin Syringe-Needle U-100 (B-D INS SYR ULTRAFINE .5CC/30G) 30G X 1/2" 0.5 ML MISC Use  to inject insulin once daily. 100 each 11   levonorgestrel (MIRENA) 20 MCG/24HR IUD 1 Intra Uterine Device (1 each total) by Intrauterine route once. 1 each 0   metFORMIN (GLUCOPHAGE) 1000 MG tablet TAKE 2 TABLETS BY MOUTH DAILY WITH SUPPER. 180  tablet 3   metoprolol succinate (TOPROL-XL) 25 MG 24 hr tablet TAKE 1 TABLET BY MOUTH DAILY 90 tablet 3   omeprazole (PRILOSEC) 40 MG capsule TAKE 1 CAPSULE BY MOUTH DAILY BEFORE SUPPER (Patient taking differently: Take 40 mg by mouth every other day.) 90 capsule 2   ondansetron (ZOFRAN ODT) 4 MG disintegrating tablet Take 1 tablet (4 mg total) by mouth every 8 (eight) hours as needed for nausea or vomiting. 20 tablet 0   oxyCODONE (ROXICODONE) 5 MG immediate release tablet Take 1 tablet (5 mg total) by mouth every 4 (four) hours as needed for severe pain (pain score 7-10) or breakthrough pain. 10 tablet 0   OZEMPIC, 0.25 OR 0.5 MG/DOSE, 2 MG/3ML SOPN INJECT 0.5MG  UNDER THE SKIN ONCE A WEEK 9 mL 3   promethazine (PHENERGAN) 25 MG tablet Take 1 tablet (25 mg total) by mouth every 6 (six) hours as needed for nausea. 15 tablet 0   rizatriptan (MAXALT) 10 MG tablet Take 1 tablet (10 mg total) by mouth as needed for migraine. May repeat in 2 hours if needed 10 tablet 8   rosuvastatin (CRESTOR) 5 MG tablet TAKE 1 TABLET(5 MG) BY MOUTH 2 TIMES A WEEK 13 tablet 3   SYRINGE-NEEDLE, DISP, 3 ML 25G X 1" 3 ML MISC Use for monthly B12 injections 12 each 0   triamcinolone cream (KENALOG) 0.1 % Apply 1 application topically 2 (two) times daily as needed (for itching). 28.4 g 1   No current facility-administered medications for this visit.   No results found.  Review of Systems:   A ROS was performed including pertinent positives and negatives as documented in the HPI.   Musculoskeletal Exam:    There were no vitals taken for this visit.  Right hip incisions are well-appearing without erythema or drainage.  She has 30 degrees internal/external rotation of the right  hip without pain.  She is able to perform a straight leg raise on the right.  Walks with mild antalgic gait.  Distal neurosensory exam is intact  Imaging:      I personally reviewed and interpreted the radiographs.   Assessment:   6 weeks status post right hip arthroscopic labral repair overall doing very well.  He is overall doing extremely well.  At this time I will plan to see her back in 6 weeks for final check  Plan :    -Return to clinic 6 weeks to reassess      I personally saw and evaluated the patient, and participated in the management and treatment plan.  Huel Cote, MD Attending Physician, Orthopedic Surgery  This document was dictated using Dragon voice recognition software. A reasonable attempt at proof reading has been made to minimize errors.

## 2023-09-01 ENCOUNTER — Encounter (HOSPITAL_BASED_OUTPATIENT_CLINIC_OR_DEPARTMENT_OTHER): Payer: Self-pay | Admitting: Physical Therapy

## 2023-09-01 ENCOUNTER — Ambulatory Visit (HOSPITAL_BASED_OUTPATIENT_CLINIC_OR_DEPARTMENT_OTHER): Payer: 59 | Admitting: Physical Therapy

## 2023-09-01 DIAGNOSIS — M25551 Pain in right hip: Secondary | ICD-10-CM

## 2023-09-01 DIAGNOSIS — M6281 Muscle weakness (generalized): Secondary | ICD-10-CM

## 2023-09-01 DIAGNOSIS — R262 Difficulty in walking, not elsewhere classified: Secondary | ICD-10-CM

## 2023-09-01 NOTE — Therapy (Signed)
OUTPATIENT PHYSICAL THERAPY TREATMENT   Patient Name: Lisa Duran MRN: [id number] DOB:[birth year], 43 y.o., female Today's Date: 09/01/2023  Progress Note   Reporting Period 07/25/23 to 08/28/23   See note below for Objective Data and Assessment of Progress/Goals   END OF SESSION:  PT End of Session - 09/01/23 0851     Visit Number 9    Number of Visits 22    Date for PT Re-Evaluation 10/10/23    Authorization Type [employer]    PT Start Time 0849    PT Stop Time 0927    PT Time Calculation (min) 38 min    Activity Tolerance Patient tolerated treatment well    Behavior During Therapy [facility] for tasks assessed/performed              Past Medical History:  Diagnosis Date   ADHD (attention deficit hyperactivity disorder)    ALLERGIC RHINITIS    Allergy    Anemia    ANXIETY    Depression    DIABETES MELLITUS, TYPE II    Fibromyalgia 2018   GERD (gastroesophageal reflux disease)    MIGRAINE, COMMON    Obesity, unspecified    OVARIAN CYST    Past Surgical History:  Procedure Laterality Date   KNEE ARTHROSCOPY Left 08/25/2022   Patient Active Problem List   Diagnosis Date Noted   Tear of right acetabular labrum 07/21/2023   Cellulitis of abdominal wall 12/26/2022   B12 deficiency 05/09/2021   Dyslipidemia 04/14/2019   Enteritis 01/12/2019   Sleep walking disorder 04/06/2017   Iron deficiency anemia 03/27/2017   Fibromyalgia 02/03/2017   GERD (gastroesophageal reflux disease) 06/24/2015   Eczema 06/22/2015   Depression 06/22/2015   Tachycardia 06/16/2014   Food allergy    ADHD (attention deficit hyperactivity disorder) 01/27/2011   Anxiety 02/20/2010   Allergic rhinitis 11/21/2009   OVARIAN CYST 11/21/2009   Diabetes mellitus type 2 with neurological manifestations (HCC) 11/20/2009   Class 2 obesity in adult 11/20/2009   Migraine without aura 11/20/2009    REFERRING PROVIDER:  Huel Cote, MD     REFERRING DIAG: [phone] (ICD-10-CM) - Tear of right  acetabular labrum, initial encounter   Post op labral repair  Rationale for Evaluation and Treatment: Rehabilitation  THERAPY DIAG:  Pain in right hip  Difficulty in walking, not elsewhere classified  Muscle weakness (generalized)  ONSET DATE: 07/21/23   SUBJECTIVE:                                                                                                                                                                                           SUBJECTIVE STATEMENT: Hip  is sore but from working it, its the stabbing pain in left, medial peripatellar region of knee that hurts.    PERTINENT HISTORY:  Fibromyalgia, Iron deficiency  PAIN:  Are you having pain? Yes: NPRS scale: Minimal/10 Pain location: Rt hip Pain description: ache Aggravating factors: Activation of hip flexors, sitting too long Relieving factors: stretching and getting weight off of knee  PRECAUTIONS:  None  RED FLAGS: None   WEIGHT BEARING RESTRICTIONS:  Yes touchdown weightbearing for 2 weeks  FALLS:  Has patient fallen in last 6 months? No  OCCUPATION:  Print production planner, has to lift boxes of medications  PLOF:  Independent  PATIENT GOALS:  Walk her dog that is about 100 pounds, hiking   OBJECTIVE:  Note: Objective measures were completed at Evaluation unless otherwise noted.  COGNITIVE STATUS: Within functional limits for tasks assessed   SENSATION: WFL  EDEMA:  No  POSTURE:  No Significant postural limitations  FOTO:  12/14: 42 Sep 21, 2023: 72 % function  GAIT:  12/14 Comments: Arrived with bilateral axillary crutches and hip brace with touchdown weightbearing precautions followed   LOWER EXTREMITY  ROM:  09-21-23 WFL all hip planes  Active ROM Right Sep 21, 2023 Left 2023-09-21  Hip flexion    Hip extension    Hip abduction    Hip adduction    Hip internal rotation    Hip external rotation    Knee flexion    Knee extension    Ankle dorsiflexion    Ankle  plantarflexion    Ankle inversion    Ankle eversion     (Blank rows = not tested)*= pain  LOWER EXTREMITY MMT:  MMT Right 09-21-23 Left 09-21-23  Hip flexion 55.4 52.6  Hip extension 60.7 62.2  Hip abduction 56.6 61.7  Hip adduction    Hip internal rotation    Hip external rotation    Knee flexion 44.4 55.5  Knee extension 70.8 70.6  Ankle dorsiflexion    Ankle plantarflexion    Ankle inversion    Ankle eversion     (Blank rows = not tested) *= pain  Reassessment 2023-09-21 30 second STS 22 Reps without compensation SLS:  >40 seconds on RLE with good pelvic control  TREATMENT:                                                                                                                              Treatment                            1/21:  Mcconnell for lateral to medial patellar tracking on left side Ball bw knees- squat to tap table, also with ball removed Single leg lateral step down 4" SLS with head turns Heel raises ball bw ankles Gait- trunk rotation, arm swing, hinge forward slightly Dead lift review   09/21/23 Reassessment  Standing hip hinge 3 x 10 STS with RLE bias 2 x  10  Lateral stepping 4 x 10 feet x 2  08/21/23 Sidelying hip abduction 2 x 10, circles CW/CCW 2 x 10 each Sidelying reverse clams 2 x 10 Tall kneeling hip hinge 2 x 10 1/2 kneeling glute/ab sets with overhead press 3# 2 x 10, with diagonal lift 3# 2 x 10  Squat chair taps 2 x 10    Treatment                            08/17/23:  Gait training- pressing Rt ASIS anteriorly rather than opening laterally at toe off Sidelying hip abd x10, circles x10   x2 Clam & reverse Prone hip ext + HS curl in extension Roller to ITB & lateral HS, quads- edu in self rolling Half kneel + Y,progressed yellow tband with fatigue Tall kneel + lat pull; + post resistance   08/14/23 Bridge 1 x 10DL, 2 x 10 SL Prone hip extension 3 x 10 Tall kneeling hip hinge 2 x 10 Tall kneeling with shoulder extension  GTB 2 x 10  1/2 kneeling glute set with ab set 10 x 10 second holds STS 2 x 10 Gait without AD Weight shifts with glute activation lateral and A/P 1 x 20 each   08/07/23 STM to hip flexors, adductors Bridge 3 x 10 Sidelying clam RTB 3 x 10 Prone quad stretch 5 x 20 second holds Prone hamstring curls 2# 3 x 10 Gait with unilateral crutch/ SPC     PATIENT EDUCATION:  Education details: Anatomy of condition, POC, HEP, exercise form/rationale; 08/28/23: HEP, reassessment findings Person educated: Patient Education method: Explanation, Demonstration, Tactile cues, Verbal cues, and Handouts Education comprehension: verbalized understanding, returned demonstration, verbal cues required, and tactile cues required  HOME EXERCISE PROGRAM: [email or web address]-prehab program  New postop program: [id number]   ASSESSMENT:  CLINICAL IMPRESSION: Able to improve patellar tracking and decrease pain with tape today- will cont to use PRN. Trunk rotation is lacking in gait and paired with increased extension through trunk- will continue to practice alternating pattern.     REHAB POTENTIAL: Good  CLINICAL DECISION MAKING: Stable/uncomplicated  EVALUATION COMPLEXITY: Low   GOALS: Goals reviewed with patient? Yes  SHORT TERM GOALS: Target date: 1/7  Pain free flexion to 90 Baseline: Goal status: MET  2.  Demo controlled quad set with hold Baseline:  Goal status: MET  3.  30s sit to stand without pain or compensation Baseline:  22 reps Goal status: MET  4.  SLS 30s without compensation Baseline:  Goal status: MET  LONG TERM GOALS:  Able to demo step up on 6" step with level pelvis and proper form Baseline:  Goal status: INITIAL Week 8 2/4  2.  Will tolerate at least 3 min on elliptical, demonstrating good tolerance to repetitive weight bearing motion Baseline:  Goal status: INITIAL  Week 8 2/4  3.  Demonstrate proper form in at least 10 continuous lunges without increased  pain Baseline:  Goal status: INITIAL  Week 12 3/4  4.  Demonstrate gentle, double and single foot plyometric motions with good proximal form Baseline:  Goal status: INITIAL Week 12 3/4  5.  Patient will demonstrate appropriate proximal control for navigating unstable surfaces and return to hiking Baseline:  Goal status: INITIAL   6.  Patient will be able to walk her dog without limitation by her hip Baseline:  Goal status: INITIAL    PLAN:  PT FREQUENCY: 1-2 visits per week  PT DURATION: POC date  PLANNED INTERVENTIONS: 97164- PT Re-evaluation, 97110-Therapeutic exercises, 97530- Therapeutic activity, 97112- Neuromuscular re-education, [phone]- Self Care, 64403- Manual therapy, [phone]- Gait training, [phone]- Aquatic Therapy, Patient/Family education, Balance training, Stair training, Taping, Dry Needling, Joint mobilization, Spinal mobilization, Scar mobilization, Cryotherapy, and Moist heat.  PLAN FOR NEXT SESSION: per protocol   Aarish Rockers C. Ariyanna Oien PT, DPT 09/01/23 9:28 AM

## 2023-09-03 ENCOUNTER — Ambulatory Visit: Payer: 59 | Admitting: Internal Medicine

## 2023-09-04 ENCOUNTER — Telehealth (HOSPITAL_BASED_OUTPATIENT_CLINIC_OR_DEPARTMENT_OTHER): Payer: Self-pay | Admitting: Physical Therapy

## 2023-09-04 ENCOUNTER — Encounter (HOSPITAL_BASED_OUTPATIENT_CLINIC_OR_DEPARTMENT_OTHER): Payer: Self-pay

## 2023-09-04 ENCOUNTER — Ambulatory Visit (HOSPITAL_BASED_OUTPATIENT_CLINIC_OR_DEPARTMENT_OTHER): Payer: 59 | Admitting: Physical Therapy

## 2023-09-04 NOTE — Therapy (Incomplete)
OUTPATIENT PHYSICAL THERAPY TREATMENT   Patient Name: Lisa Duran MRN: [id number] DOB:July 13, 1981, 43 y.o., female Today's Date: 09/04/2023  Progress Note   Reporting Period 07/25/23 to 08/28/23   See note below for Objective Data and Assessment of Progress/Goals   END OF SESSION:     Past Medical History:  Diagnosis Date   ADHD (attention deficit hyperactivity disorder)    ALLERGIC RHINITIS    Allergy    Anemia    ANXIETY    Depression    DIABETES MELLITUS, TYPE II    Fibromyalgia 2018   GERD (gastroesophageal reflux disease)    MIGRAINE, COMMON    Obesity, unspecified    OVARIAN CYST    Past Surgical History:  Procedure Laterality Date   KNEE ARTHROSCOPY Left 08/25/2022   Patient Active Problem List   Diagnosis Date Noted   Tear of right acetabular labrum 07/21/2023   Cellulitis of abdominal wall 12/26/2022   B12 deficiency 05/09/2021   Dyslipidemia 04/14/2019   Enteritis 01/12/2019   Sleep walking disorder 04/06/2017   Iron deficiency anemia 03/27/2017   Fibromyalgia 02/03/2017   GERD (gastroesophageal reflux disease) 06/24/2015   Eczema 06/22/2015   Depression 06/22/2015   Tachycardia 06/16/2014   Food allergy    ADHD (attention deficit hyperactivity disorder) 01/27/2011   Anxiety 02/20/2010   Allergic rhinitis 11/21/2009   OVARIAN CYST 11/21/2009   Diabetes mellitus type 2 with neurological manifestations (HCC) 11/20/2009   Class 2 obesity in adult 11/20/2009   Migraine without aura 11/20/2009    REFERRING PROVIDER:  Huel Cote, MD     REFERRING DIAG: [phone] (ICD-10-CM) - Tear of right acetabular labrum, initial encounter   Post op labral repair  Rationale for Evaluation and Treatment: Rehabilitation  THERAPY DIAG:  No diagnosis found.  ONSET DATE: 07/21/23   SUBJECTIVE:                                                                                                                                                                                            SUBJECTIVE STATEMENT: ***   PERTINENT HISTORY:  Fibromyalgia, Iron deficiency  PAIN:  Are you having pain? Yes: NPRS scale: Minimal/10 Pain location: Rt hip Pain description: ache Aggravating factors: Activation of hip flexors, sitting too long Relieving factors: stretching and getting weight off of knee  PRECAUTIONS:  None  RED FLAGS: None   WEIGHT BEARING RESTRICTIONS:  Yes touchdown weightbearing for 2 weeks  FALLS:  Has patient fallen in last 6 months? No  OCCUPATION:  Print production planner, has to lift boxes of medications  PLOF:  Independent  PATIENT GOALS:  Walk her dog that is  about 100 pounds, hiking   OBJECTIVE:  Note: Objective measures were completed at Evaluation unless otherwise noted.  COGNITIVE STATUS: Within functional limits for tasks assessed   SENSATION: WFL  EDEMA:  No  POSTURE:  No Significant postural limitations  FOTO:  12/14: 42 2023/09/27: 72 % function  GAIT:  12/14 Comments: Arrived with bilateral axillary crutches and hip brace with touchdown weightbearing precautions followed   LOWER EXTREMITY  ROM:  09-27-23 WFL all hip planes  Active ROM Right September 27, 2023 Left 09-27-23  Hip flexion    Hip extension    Hip abduction    Hip adduction    Hip internal rotation    Hip external rotation    Knee flexion    Knee extension    Ankle dorsiflexion    Ankle plantarflexion    Ankle inversion    Ankle eversion     (Blank rows = not tested)*= pain  LOWER EXTREMITY MMT:  MMT Right September 27, 2023 Left 09-27-23  Hip flexion 55.4 52.6  Hip extension 60.7 62.2  Hip abduction 56.6 61.7  Hip adduction    Hip internal rotation    Hip external rotation    Knee flexion 44.4 55.5  Knee extension 70.8 70.6  Ankle dorsiflexion    Ankle plantarflexion    Ankle inversion    Ankle eversion     (Blank rows = not tested) *= pain  Reassessment 09/27/2023 30 second STS 22 Reps without compensation SLS:  >40 seconds  on RLE with good pelvic control  TREATMENT:                                                                                                                              Treatment                            ***:  ***   Treatment                            1/21:  Mcconnell for lateral to medial patellar tracking on left side Ball bw knees- squat to tap table, also with ball removed Single leg lateral step down 4" SLS with head turns Heel raises ball bw ankles Gait- trunk rotation, arm swing, hinge forward slightly Dead lift review   2023/09/27 Reassessment  Standing hip hinge 3 x 10 STS with RLE bias 2 x 10  Lateral stepping 4 x 10 feet x 2  08/21/23 Sidelying hip abduction 2 x 10, circles CW/CCW 2 x 10 each Sidelying reverse clams 2 x 10 Tall kneeling hip hinge 2 x 10 1/2 kneeling glute/ab sets with overhead press 3# 2 x 10, with diagonal lift 3# 2 x 10  Squat chair taps 2 x 10      PATIENT EDUCATION:  Education details: Anatomy of condition, POC, HEP, exercise form/rationale; 2023-09-27: HEP, reassessment findings Person educated: Patient  Education method: Explanation, Demonstration, Tactile cues, Verbal cues, and Handouts Education comprehension: verbalized understanding, returned demonstration, verbal cues required, and tactile cues required  HOME EXERCISE PROGRAM: [email or web address]-prehab program  New postop program: [id number]   ASSESSMENT:  CLINICAL IMPRESSION: ***    REHAB POTENTIAL: Good  CLINICAL DECISION MAKING: Stable/uncomplicated  EVALUATION COMPLEXITY: Low   GOALS: Goals reviewed with patient? Yes  SHORT TERM GOALS: Target date: 1/7  Pain free flexion to 90 Baseline: Goal status: MET  2.  Demo controlled quad set with hold Baseline:  Goal status: MET  3.  30s sit to stand without pain or compensation Baseline:  22 reps Goal status: MET  4.  SLS 30s without compensation Baseline:  Goal status: MET  LONG TERM GOALS:  Able to demo step up on  6" step with level pelvis and proper form Baseline:  Goal status: INITIAL Week 8 2/4  2.  Will tolerate at least 3 min on elliptical, demonstrating good tolerance to repetitive weight bearing motion Baseline:  Goal status: INITIAL  Week 8 2/4  3.  Demonstrate proper form in at least 10 continuous lunges without increased pain Baseline:  Goal status: INITIAL  Week 12 3/4  4.  Demonstrate gentle, double and single foot plyometric motions with good proximal form Baseline:  Goal status: INITIAL Week 12 3/4  5.  Patient will demonstrate appropriate proximal control for navigating unstable surfaces and return to hiking Baseline:  Goal status: INITIAL   6.  Patient will be able to walk her dog without limitation by her hip Baseline:  Goal status: INITIAL    PLAN:  PT FREQUENCY: 1-2 visits per week  PT DURATION: POC date  PLANNED INTERVENTIONS: 97164- PT Re-evaluation, 97110-Therapeutic exercises, 97530- Therapeutic activity, 97112- Neuromuscular re-education, 97535- Self Care, 40981- Manual therapy, [phone]- Gait training, [phone]- Aquatic Therapy, Patient/Family education, Balance training, Stair training, Taping, Dry Needling, Joint mobilization, Spinal mobilization, Scar mobilization, Cryotherapy, and Moist heat.  PLAN FOR NEXT SESSION: per protocol   Anevay Campanella C. Imagine Nest PT, DPT 09/04/23 7:50 AM

## 2023-09-04 NOTE — Telephone Encounter (Signed)
LVM regarding NS for today's appt and advised of next appt date/time.   Lisa Duran C. [name] PT, DPT 09/04/23 9:05 AM

## 2023-09-08 ENCOUNTER — Ambulatory Visit (HOSPITAL_BASED_OUTPATIENT_CLINIC_OR_DEPARTMENT_OTHER): Payer: 59 | Admitting: Physical Therapy

## 2023-09-08 DIAGNOSIS — M25551 Pain in right hip: Secondary | ICD-10-CM | POA: Diagnosis not present

## 2023-09-08 DIAGNOSIS — R262 Difficulty in walking, not elsewhere classified: Secondary | ICD-10-CM

## 2023-09-08 DIAGNOSIS — M6281 Muscle weakness (generalized): Secondary | ICD-10-CM

## 2023-09-08 NOTE — Therapy (Signed)
OUTPATIENT PHYSICAL THERAPY TREATMENT   Patient Name: Lisa Duran MRN: [id number] DOB:1981/06/05, 43 y.o., female Today's Date: 09/08/2023   END OF SESSION:  PT End of Session - 09/08/23 1433     Visit Number 10    Number of Visits 22    Date for PT Re-Evaluation 10/10/23    Authorization Type [employer]    Progress Note Due on Visit 18    PT Start Time 1434    PT Stop Time 1512    PT Time Calculation (min) 38 min    Activity Tolerance Patient tolerated treatment well    Behavior During Therapy WFL for tasks assessed/performed              Past Medical History:  Diagnosis Date   ADHD (attention deficit hyperactivity disorder)    ALLERGIC RHINITIS    Allergy    Anemia    ANXIETY    Depression    DIABETES MELLITUS, TYPE II    Fibromyalgia 2018   GERD (gastroesophageal reflux disease)    MIGRAINE, COMMON    Obesity, unspecified    OVARIAN CYST    Past Surgical History:  Procedure Laterality Date   KNEE ARTHROSCOPY Left 08/25/2022   Patient Active Problem List   Diagnosis Date Noted   Tear of right acetabular labrum 07/21/2023   Cellulitis of abdominal wall 12/26/2022   B12 deficiency 05/09/2021   Dyslipidemia 04/14/2019   Enteritis 01/12/2019   Sleep walking disorder 04/06/2017   Iron deficiency anemia 03/27/2017   Fibromyalgia 02/03/2017   GERD (gastroesophageal reflux disease) 06/24/2015   Eczema 06/22/2015   Depression 06/22/2015   Tachycardia 06/16/2014   Food allergy    ADHD (attention deficit hyperactivity disorder) 01/27/2011   Anxiety 02/20/2010   Allergic rhinitis 11/21/2009   OVARIAN CYST 11/21/2009   Diabetes mellitus type 2 with neurological manifestations (HCC) 11/20/2009   Class 2 obesity in adult 11/20/2009   Migraine without aura 11/20/2009    REFERRING PROVIDER:  Huel Cote, MD     REFERRING DIAG: [phone] (ICD-10-CM) - Tear of right acetabular labrum, initial encounter   Post op labral repair  Rationale for Evaluation and  Treatment: Rehabilitation  THERAPY DIAG:  Pain in right hip  Difficulty in walking, not elsewhere classified  Muscle weakness (generalized)  ONSET DATE: 07/21/23   SUBJECTIVE:                                                                                                                                                                                           SUBJECTIVE STATEMENT: Patient states overall doing alright. Sore in R hip. L knee is bothering her.  PERTINENT HISTORY:  Fibromyalgia, Iron deficiency  PAIN:  Are you having pain? Yes: NPRS scale: Minimal/10 Pain location: Rt hip Pain description: ache Aggravating factors: Activation of hip flexors, sitting too long Relieving factors: stretching and getting weight off of knee  PRECAUTIONS:  None  RED FLAGS: None   WEIGHT BEARING RESTRICTIONS:  Yes touchdown weightbearing for 2 weeks  FALLS:  Has patient fallen in last 6 months? No  OCCUPATION:  Print production planner, has to lift boxes of medications  PLOF:  Independent  PATIENT GOALS:  Walk her dog that is about 100 pounds, hiking   OBJECTIVE:  Note: Objective measures were completed at Evaluation unless otherwise noted.  COGNITIVE STATUS: Within functional limits for tasks assessed   SENSATION: WFL  EDEMA:  No  POSTURE:  No Significant postural limitations  FOTO:  12/14: 42 08/28/23: 72 % function  GAIT:  12/14 Comments: Arrived with bilateral axillary crutches and hip brace with touchdown weightbearing precautions followed   LOWER EXTREMITY  ROM:  08/28/23 WFL all hip planes  Active ROM Right 08/28/2023 Left 08/28/2023  Hip flexion    Hip extension    Hip abduction    Hip adduction    Hip internal rotation    Hip external rotation    Knee flexion    Knee extension    Ankle dorsiflexion    Ankle plantarflexion    Ankle inversion    Ankle eversion     (Blank rows = not tested)*= pain  LOWER EXTREMITY MMT:  MMT Right 08/28/2023  Left 08/28/2023  Hip flexion 55.4 52.6  Hip extension 60.7 62.2  Hip abduction 56.6 61.7  Hip adduction    Hip internal rotation    Hip external rotation    Knee flexion 44.4 55.5  Knee extension 70.8 70.6  Ankle dorsiflexion    Ankle plantarflexion    Ankle inversion    Ankle eversion     (Blank rows = not tested) *= pain  Reassessment 08/28/23 30 second STS 22 Reps without compensation SLS:  >40 seconds on RLE with good pelvic control  TREATMENT:                                                                                                                              09/08/23 Elliptical 5 minutes - dynamic warm up Squat chair taps 2 x 10  Standing hip hinge dowel 1x 10, 3# 2 x 10 Plank on knees 3 x 20 second holds SLS with vectors 3 x 5 second holds  Treatment                            1/21:  Mcconnell for lateral to medial patellar tracking on left side Ball bw knees- squat to tap table, also with ball removed Single leg lateral step down 4" SLS with head turns Heel raises ball bw ankles Gait- trunk rotation, arm swing, hinge forward slightly  Dead lift review   08/28/23 Reassessment  Standing hip hinge 3 x 10 STS with RLE bias 2 x 10  Lateral stepping 4 x 10 feet x 2  08/21/23 Sidelying hip abduction 2 x 10, circles CW/CCW 2 x 10 each Sidelying reverse clams 2 x 10 Tall kneeling hip hinge 2 x 10 1/2 kneeling glute/ab sets with overhead press 3# 2 x 10, with diagonal lift 3# 2 x 10  Squat chair taps 2 x 10    Treatment                            08/17/23:  Gait training- pressing Rt ASIS anteriorly rather than opening laterally at toe off Sidelying hip abd x10, circles x10   x2 Clam & reverse Prone hip ext + HS curl in extension Roller to ITB & lateral HS, quads- edu in self rolling Half kneel + Y,progressed yellow tband with fatigue Tall kneel + lat pull; + post resistance   08/14/23 Bridge 1 x 10DL, 2 x 10 SL Prone hip extension 3 x 10 Tall kneeling  hip hinge 2 x 10 Tall kneeling with shoulder extension GTB 2 x 10  1/2 kneeling glute set with ab set 10 x 10 second holds STS 2 x 10 Gait without AD Weight shifts with glute activation lateral and A/P 1 x 20 each   08/07/23 STM to hip flexors, adductors Bridge 3 x 10 Sidelying clam RTB 3 x 10 Prone quad stretch 5 x 20 second holds Prone hamstring curls 2# 3 x 10 Gait with unilateral crutch/ SPC     PATIENT EDUCATION:  Education details: Anatomy of condition, POC, HEP, exercise form/rationale; 08/28/23: HEP, reassessment findings Person educated: Patient Education method: Explanation, Demonstration, Tactile cues, Verbal cues, and Handouts Education comprehension: verbalized understanding, returned demonstration, verbal cues required, and tactile cues required  HOME EXERCISE PROGRAM: [email or web address]-prehab program  New postop program: [email or web address]   ASSESSMENT:  CLINICAL IMPRESSION: Began session on elliptical but fatigue evident following. Continued with glute strengthening and provided cueing for hip hinging mechanics as there are impaired mechanics following fatigue. Moderate to high fatigue at end of session. Patient will continue to benefit from physical therapy in order to improve function and reduce impairment.     REHAB POTENTIAL: Good  CLINICAL DECISION MAKING: Stable/uncomplicated  EVALUATION COMPLEXITY: Low   GOALS: Goals reviewed with patient? Yes  SHORT TERM GOALS: Target date: 1/7  Pain free flexion to 90 Baseline: Goal status: MET  2.  Demo controlled quad set with hold Baseline:  Goal status: MET  3.  30s sit to stand without pain or compensation Baseline:  22 reps Goal status: MET  4.  SLS 30s without compensation Baseline:  Goal status: MET  LONG TERM GOALS:  Able to demo step up on 6" step with level pelvis and proper form Baseline:  Goal status: INITIAL Week 8 2/4  2.  Will tolerate at least 3 min on elliptical, demonstrating good  tolerance to repetitive weight bearing motion Baseline:  Goal status: INITIAL  Week 8 2/4  3.  Demonstrate proper form in at least 10 continuous lunges without increased pain Baseline:  Goal status: INITIAL  Week 12 3/4  4.  Demonstrate gentle, double and single foot plyometric motions with good proximal form Baseline:  Goal status: INITIAL Week 12 3/4  5.  Patient will demonstrate appropriate proximal control for navigating unstable surfaces and return to hiking Baseline:  Goal  status: INITIAL   6.  Patient will be able to walk her dog without limitation by her hip Baseline:  Goal status: INITIAL    PLAN:  PT FREQUENCY: 1-2 visits per week  PT DURATION: POC date  PLANNED INTERVENTIONS: 97164- PT Re-evaluation, 97110-Therapeutic exercises, 97530- Therapeutic activity, 97112- Neuromuscular re-education, 97535- Self Care, 29562- Manual therapy, [phone]- Gait training, [phone]- Aquatic Therapy, Patient/Family education, Balance training, Stair training, Taping, Dry Needling, Joint mobilization, Spinal mobilization, Scar mobilization, Cryotherapy, and Moist heat.  PLAN FOR NEXT SESSION: per protocol    Reola Mosher Babak Lucus, PT 09/08/2023, 3:12 PM

## 2023-09-09 ENCOUNTER — Other Ambulatory Visit: Payer: Self-pay | Admitting: Internal Medicine

## 2023-09-10 ENCOUNTER — Encounter (HOSPITAL_BASED_OUTPATIENT_CLINIC_OR_DEPARTMENT_OTHER): Payer: Self-pay | Admitting: Physical Therapy

## 2023-09-10 ENCOUNTER — Ambulatory Visit (HOSPITAL_BASED_OUTPATIENT_CLINIC_OR_DEPARTMENT_OTHER): Payer: 59 | Admitting: Physical Therapy

## 2023-09-10 DIAGNOSIS — M25551 Pain in right hip: Secondary | ICD-10-CM | POA: Diagnosis not present

## 2023-09-10 DIAGNOSIS — M6281 Muscle weakness (generalized): Secondary | ICD-10-CM

## 2023-09-10 DIAGNOSIS — R262 Difficulty in walking, not elsewhere classified: Secondary | ICD-10-CM

## 2023-09-10 MED ORDER — AMPHETAMINE-DEXTROAMPHETAMINE 20 MG PO TABS: 20.0000 mg | ORAL_TABLET | Freq: Two times a day (BID) | ORAL | 0 refills | Status: DC

## 2023-09-10 NOTE — Patient Instructions (Signed)

## 2023-09-10 NOTE — Therapy (Signed)
OUTPATIENT PHYSICAL THERAPY TREATMENT   Patient Name: Lisa Duran MRN: [id number] DOB:1981-03-19, 43 y.o., female Today's Date: 09/10/2023   END OF SESSION:  PT End of Session - 09/10/23 0929     Visit Number 11    Number of Visits 22    Date for PT Re-Evaluation 10/10/23    Authorization Type [employer]    Progress Note Due on Visit 18    PT Start Time 0929    PT Stop Time 1012    PT Time Calculation (min) 43 min    Activity Tolerance Patient tolerated treatment well    Behavior During Therapy WFL for tasks assessed/performed               Past Medical History:  Diagnosis Date   ADHD (attention deficit hyperactivity disorder)    ALLERGIC RHINITIS    Allergy    Anemia    ANXIETY    Depression    DIABETES MELLITUS, TYPE II    Fibromyalgia 2018   GERD (gastroesophageal reflux disease)    MIGRAINE, COMMON    Obesity, unspecified    OVARIAN CYST    Past Surgical History:  Procedure Laterality Date   KNEE ARTHROSCOPY Left 08/25/2022   Patient Active Problem List   Diagnosis Date Noted   Tear of right acetabular labrum 07/21/2023   Cellulitis of abdominal wall 12/26/2022   B12 deficiency 05/09/2021   Dyslipidemia 04/14/2019   Enteritis 01/12/2019   Sleep walking disorder 04/06/2017   Iron deficiency anemia 03/27/2017   Fibromyalgia 02/03/2017   GERD (gastroesophageal reflux disease) 06/24/2015   Eczema 06/22/2015   Depression 06/22/2015   Tachycardia 06/16/2014   Food allergy    ADHD (attention deficit hyperactivity disorder) 01/27/2011   Anxiety 02/20/2010   Allergic rhinitis 11/21/2009   OVARIAN CYST 11/21/2009   Diabetes mellitus type 2 with neurological manifestations (HCC) 11/20/2009   Class 2 obesity in adult 11/20/2009   Migraine without aura 11/20/2009    REFERRING PROVIDER:  Huel Cote, MD     REFERRING DIAG: [phone] (ICD-10-CM) - Tear of right acetabular labrum, initial encounter   Post op labral repair  Rationale for Evaluation and  Treatment: Rehabilitation  THERAPY DIAG:  Pain in right hip  Difficulty in walking, not elsewhere classified  Muscle weakness (generalized)  ONSET DATE: 07/21/23   SUBJECTIVE:                                                                                                                                                                                           SUBJECTIVE STATEMENT: Feeling discomfort from Rt lower back to post knee. Would like to retape  left knee.    PERTINENT HISTORY:  Fibromyalgia, Iron deficiency  PAIN:  Are you having pain? Yes: NPRS scale: Minimal/10 Pain location: Rt hip Pain description: ache Aggravating factors: Activation of hip flexors, sitting too long Relieving factors: stretching and getting weight off of knee  PRECAUTIONS:  None  RED FLAGS: None   WEIGHT BEARING RESTRICTIONS:  Yes touchdown weightbearing for 2 weeks  FALLS:  Has patient fallen in last 6 months? No  OCCUPATION:  Print production planner, has to lift boxes of medications  PLOF:  Independent  PATIENT GOALS:  Walk her dog that is about 100 pounds, hiking   OBJECTIVE:  Note: Objective measures were completed at Evaluation unless otherwise noted.  COGNITIVE STATUS: Within functional limits for tasks assessed   SENSATION: WFL  EDEMA:  No  POSTURE:  No Significant postural limitations  FOTO:  12/14: 42 08/28/23: 72 % function  GAIT:  12/14 Comments: Arrived with bilateral axillary crutches and hip brace with touchdown weightbearing precautions followed   LOWER EXTREMITY  ROM:  08/28/23 WFL all hip planes  Active ROM Right 08/28/2023 Left 08/28/2023  Hip flexion    Hip extension    Hip abduction    Hip adduction    Hip internal rotation    Hip external rotation    Knee flexion    Knee extension    Ankle dorsiflexion    Ankle plantarflexion    Ankle inversion    Ankle eversion     (Blank rows = not tested)*= pain  LOWER EXTREMITY MMT:  MMT  Right 08/28/2023 Left 08/28/2023  Hip flexion 55.4 52.6  Hip extension 60.7 62.2  Hip abduction 56.6 61.7  Hip adduction    Hip internal rotation    Hip external rotation    Knee flexion 44.4 55.5  Knee extension 70.8 70.6  Ankle dorsiflexion    Ankle plantarflexion    Ankle inversion    Ankle eversion     (Blank rows = not tested) *= pain  Reassessment 08/28/23 30 second STS 22 Reps without compensation SLS:  >40 seconds on RLE with good pelvic control  TREATMENT:                                                                                                                               Treatment                            1/30:  Self MET- Lt hip flexors/Rt extensors, iso abd/add x3 rounds Trigger Point Dry Needling  Initial Treatment: Pt instructed on Dry Needling rational, procedures, and possible side effects. Pt instructed to expect mild to moderate muscle soreness later in the day and/or into the next day.  Pt instructed in methods to reduce muscle soreness. Pt instructed to continue prescribed HEP. Patient verbalized understanding of these instructions and education.   Patient Verbal Consent Given: Yes Education Handout Provided: in  pt instructions Muscles Treated: Rt glut max/med, piriformis Electrical Stimulation Performed: No Treatment Response/Outcome: twitch with decreased concordant pain Roller to Rt quads, STM to musculature addressed with DN Lt SIJ PA spring mob  Elliptical Leg press Mcconnell tape Lt patella lateral to medial pull   09/08/23 Elliptical 5 minutes - dynamic warm up Squat chair taps 2 x 10  Standing hip hinge dowel 1x 10, 3# 2 x 10 Plank on knees 3 x 20 second holds SLS with vectors 3 x 5 second holds  Treatment                            1/21:  Mcconnell for lateral to medial patellar tracking on left side Ball bw knees- squat to tap table, also with ball removed Single leg lateral step down 4" SLS with head turns Heel raises  ball bw ankles Gait- trunk rotation, arm swing, hinge forward slightly Dead lift review   31-Aug-2023 Reassessment  Standing hip hinge 3 x 10 STS with RLE bias 2 x 10  Lateral stepping 4 x 10 feet x 2  08/21/23 Sidelying hip abduction 2 x 10, circles CW/CCW 2 x 10 each Sidelying reverse clams 2 x 10 Tall kneeling hip hinge 2 x 10 1/2 kneeling glute/ab sets with overhead press 3# 2 x 10, with diagonal lift 3# 2 x 10  Squat chair taps 2 x 10    Treatment                            08/17/23:  Gait training- pressing Rt ASIS anteriorly rather than opening laterally at toe off Sidelying hip abd x10, circles x10   x2 Clam & reverse Prone hip ext + HS curl in extension Roller to ITB & lateral HS, quads- edu in self rolling Half kneel + Y,progressed yellow tband with fatigue Tall kneel + lat pull; + post resistance      PATIENT EDUCATION:  Education details: Anatomy of condition, POC, HEP, exercise form/rationale; 08/31/23: HEP, reassessment findings Person educated: Patient Education method: Explanation, Demonstration, Tactile cues, Verbal cues, and Handouts Education comprehension: verbalized understanding, returned demonstration, verbal cues required, and tactile cues required  HOME EXERCISE PROGRAM: [email or web address]-prehab program  New postop program: [email or web address]   ASSESSMENT:  CLINICAL IMPRESSION: Functional pelvic rotation resulting in LLD and consistent with pain complaints. Pt will begin rolling Lt VL as well and continue HEP to support neutral pelvis.      REHAB POTENTIAL: Good  CLINICAL DECISION MAKING: Stable/uncomplicated  EVALUATION COMPLEXITY: Low   GOALS: Goals reviewed with patient? Yes  SHORT TERM GOALS: Target date: 1/7  Pain free flexion to 90 Baseline: Goal status: MET  2.  Demo controlled quad set with hold Baseline:  Goal status: MET  3.  30s sit to stand without pain or compensation Baseline:  22 reps Goal status: MET  4.  SLS 30s without  compensation Baseline:  Goal status: MET  LONG TERM GOALS:  Able to demo step up on 6" step with level pelvis and proper form Baseline:  Goal status: INITIAL Week 8 2/4  2.  Will tolerate at least 3 min on elliptical, demonstrating good tolerance to repetitive weight bearing motion Baseline:  Goal status: INITIAL  Week 8 2/4  3.  Demonstrate proper form in at least 10 continuous lunges without increased pain Baseline:  Goal status: INITIAL  Week 12 3/4  4.  Demonstrate  gentle, double and single foot plyometric motions with good proximal form Baseline:  Goal status: INITIAL Week 12 3/4  5.  Patient will demonstrate appropriate proximal control for navigating unstable surfaces and return to hiking Baseline:  Goal status: INITIAL   6.  Patient will be able to walk her dog without limitation by her hip Baseline:  Goal status: INITIAL    PLAN:  PT FREQUENCY: 1-2 visits per week  PT DURATION: POC date  PLANNED INTERVENTIONS: 97164- PT Re-evaluation, 97110-Therapeutic exercises, 97530- Therapeutic activity, 97112- Neuromuscular re-education, 97535- Self Care, 40981- Manual therapy, [phone]- Gait training, [phone]- Aquatic Therapy, Patient/Family education, Balance training, Stair training, Taping, Dry Needling, Joint mobilization, Spinal mobilization, Scar mobilization, Cryotherapy, and Moist heat.  PLAN FOR NEXT SESSION: per protocol   Soul Hackman C. Alexzandria Massman PT, DPT 09/10/23 10:14 AM

## 2023-09-14 ENCOUNTER — Ambulatory Visit (INDEPENDENT_AMBULATORY_CARE_PROVIDER_SITE_OTHER): Payer: 59 | Admitting: Sports Medicine

## 2023-09-14 VITALS — BP 110/82 | HR 67 | Ht 64.0 in

## 2023-09-14 DIAGNOSIS — M25562 Pain in left knee: Secondary | ICD-10-CM | POA: Diagnosis not present

## 2023-09-14 DIAGNOSIS — G8929 Other chronic pain: Secondary | ICD-10-CM

## 2023-09-14 DIAGNOSIS — M1712 Unilateral primary osteoarthritis, left knee: Secondary | ICD-10-CM

## 2023-09-14 NOTE — Progress Notes (Signed)
Lisa Duran [name] [address] [place] [address] Phone: [phone]   Assessment and Plan:     1. Primary osteoarthritis of left knee 2. Chronic pain of left knee  -Chronic with exacerbation, subsequent visit - Consistent with flare of left knee osteoarthritis and meniscal tear.  Likely occurring with patient participating in physical therapy to rehab right hip after arthroscopy with labral repair which is overall improving right hip pain - Patient elected for intra-articular CSI.  Tolerated well per note below.  CSI may temporarily increase blood glucose in patient with past medical history of DM type II - Patient has had significant benefit from HA injections in the past.  Will obtain prior authorization for HA injection at today's visit and patient can call and ask for HA injection if knee pain returns within 2025 calendar year  Procedure: Knee Joint Injection Side: Left Indication: Flare of osteoarthritis and meniscal tear  Risks explained and consent was given verbally. The site was cleaned with alcohol prep. A needle was introduced with an anterio-lateral approach. Injection given using 2mL of 1% lidocaine without epinephrine and 1mL of kenalog 40mg /ml. This was well tolerated and resulted in symptomatic relief.  Needle was removed, hemostasis achieved, and post injection instructions were explained.   Pt was advised to call or return to clinic if these symptoms worsen or fail to improve as anticipated.   Pertinent previous records reviewed include none  Follow Up: As needed. Will obtain prior authorization for HA injection at today's visit and patient can call and ask for HA injection if knee pain returns within 2025 calendar year   Subjective:   I, Lisa Duran, am serving as a scribe for Dr. Richardean Sale  Chief Complaint: Left knee pain   HPI:   05/21/23 Patient states left knee pain and continued right hip pain    09/14/23 Today patient states Left knee is in a lot of pain that started 2 weeks ago. Been going in PT for her hip labrel tear repair surgery and the PT thought it was a good idea to be seen. Patient states it feels like it did when she tore her meniscus. The right knee is starting to hurt as well. Pain is sharp and shooting. Has to use crutches sometimes. Also doing RICE  Relevant Historical Information: DM type ll  Additional pertinent review of systems negative.   Current Outpatient Medications:    ALPRAZolam (XANAX) 0.5 MG tablet, TAKE 1 TABLET(0.5 MG) BY MOUTH THREE TIMES DAILY AS NEEDED FOR ANXIETY, Disp: 60 tablet, Rfl: 5   amphetamine-dextroamphetamine (ADDERALL) 20 MG tablet, Take 1 tablet (20 mg total) by mouth 2 (two) times daily., Disp: 180 tablet, Rfl: 0   aspirin EC 325 MG tablet, Take 1 tablet (325 mg total) by mouth daily., Disp: 14 tablet, Rfl: 0   Continuous Glucose Sensor (FREESTYLE LIBRE 3 SENSOR) MISC, 1 each by Does not apply route every 14 (fourteen) days., Disp: 6 each, Rfl: 3   cyanocobalamin (VITAMIN B12) 1000 MCG/ML injection, Inject 1 mL (1,000 mcg total) into the muscle every 30 (thirty) days., Disp: 10 mL, Rfl: 1   EPINEPHrine 0.3 mg/0.3 mL IJ SOAJ injection, Use as needed for hypersensitivity reaction, Disp: 1 each, Rfl: 0   Fexofenadine HCl (ALLEGRA ALLERGY PO), Take 1 tablet by mouth daily as needed (Allergies)., Disp: , Rfl:    glucose blood (ONETOUCH VERIO) test strip, Use 2x a day, Disp: 200 each, Rfl: 3  ibuprofen (ADVIL) 800 MG tablet, TAKE 1 TABLET( 800 MG TOTAL) BY MOUTH EVERY 8 HOURS FOR 10 DAYS. TAKE WITH FOOD, ALTERNATE WITH ACETAMINOPHEN, Disp: 30 tablet, Rfl: 0   Insulin Syringe-Needle U-100 (B-D INS SYR ULTRAFINE .5CC/30G) 30G X 1/2" 0.5 ML MISC, Use to inject insulin once daily., Disp: 100 each, Rfl: 11   levonorgestrel (MIRENA) 20 MCG/24HR IUD, 1 Intra Uterine Device (1 each total) by Intrauterine route once., Disp: 1 each, Rfl: 0   metFORMIN  (GLUCOPHAGE) 1000 MG tablet, TAKE 2 TABLETS BY MOUTH DAILY WITH SUPPER., Disp: 180 tablet, Rfl: 3   metoprolol succinate (TOPROL-XL) 25 MG 24 hr tablet, TAKE 1 TABLET BY MOUTH DAILY, Disp: 90 tablet, Rfl: 3   omeprazole (PRILOSEC) 40 MG capsule, TAKE 1 CAPSULE BY MOUTH DAILY BEFORE SUPPER (Patient taking differently: Take 40 mg by mouth every other day.), Disp: 90 capsule, Rfl: 2   ondansetron (ZOFRAN ODT) 4 MG disintegrating tablet, Take 1 tablet (4 mg total) by mouth every 8 (eight) hours as needed for nausea or vomiting., Disp: 20 tablet, Rfl: 0   oxyCODONE (ROXICODONE) 5 MG immediate release tablet, Take 1 tablet (5 mg total) by mouth every 4 (four) hours as needed for severe pain (pain score 7-10) or breakthrough pain., Disp: 10 tablet, Rfl: 0   OZEMPIC, 0.25 OR 0.5 MG/DOSE, 2 MG/3ML SOPN, INJECT 0.5MG  UNDER THE SKIN ONCE A WEEK, Disp: 9 mL, Rfl: 3   promethazine (PHENERGAN) 25 MG tablet, Take 1 tablet (25 mg total) by mouth every 6 (six) hours as needed for nausea., Disp: 15 tablet, Rfl: 0   rizatriptan (MAXALT) 10 MG tablet, Take 1 tablet (10 mg total) by mouth as needed for migraine. May repeat in 2 hours if needed, Disp: 10 tablet, Rfl: 8   rosuvastatin (CRESTOR) 5 MG tablet, TAKE 1 TABLET(5 MG) BY MOUTH 2 TIMES A WEEK, Disp: 13 tablet, Rfl: 3   SYRINGE-NEEDLE, DISP, 3 ML 25G X 1" 3 ML MISC, Use for monthly B12 injections, Disp: 12 each, Rfl: 0   triamcinolone cream (KENALOG) 0.1 %, Apply 1 application topically 2 (two) times daily as needed (for itching)., Disp: 28.4 g, Rfl: 1   Objective:     Vitals:   09/14/23 1038  BP: 110/82  Pulse: 67  SpO2: 95%  Height: 5\' 4"  (1.626 m)      Body mass index is 30.55 kg/m.    Physical Exam:     General:  awake, alert oriented, no acute distress nontoxic Skin: no suspicious lesions or rashes Neuro:sensation intact and strength 5/5 with no deficits, no atrophy, normal muscle tone Psych: No signs of anxiety, depression or other mood  disorder  Left knee: Mild swelling No deformity Positive fluid wave, joint milking ROM Flex 110, Ext 0 TTP medial joint line NTTP over the quad tendon, medial fem condyle, lat fem condyle, patella, plica, patella tendon, tibial tuberostiy, fibular head, posterior fossa, pes anserine bursa, gerdy's tubercle,   lateral jt line Neg anterior and posterior drawer Neg lachman Neg sag sign Negative varus stress Negative valgus stress Positive McMurray     Gait antalgic, favoring right leg      Electronically signed by:  Lisa Duran [name] 11:04 AM 09/14/23

## 2023-09-14 NOTE — Patient Instructions (Addendum)
Good to see you  Injection in  Left knee today  We will work on left knee gel injection approval Follow up as needed when wanting gel injection

## 2023-09-15 ENCOUNTER — Ambulatory Visit (HOSPITAL_BASED_OUTPATIENT_CLINIC_OR_DEPARTMENT_OTHER): Payer: 59 | Attending: Orthopaedic Surgery | Admitting: Physical Therapy

## 2023-09-15 ENCOUNTER — Encounter (HOSPITAL_BASED_OUTPATIENT_CLINIC_OR_DEPARTMENT_OTHER): Payer: Self-pay | Admitting: Physical Therapy

## 2023-09-15 DIAGNOSIS — R262 Difficulty in walking, not elsewhere classified: Secondary | ICD-10-CM | POA: Diagnosis present

## 2023-09-15 DIAGNOSIS — M25551 Pain in right hip: Secondary | ICD-10-CM | POA: Insufficient documentation

## 2023-09-15 DIAGNOSIS — M6281 Muscle weakness (generalized): Secondary | ICD-10-CM | POA: Insufficient documentation

## 2023-09-15 NOTE — Therapy (Signed)
 OUTPATIENT PHYSICAL THERAPY TREATMENT   Patient Name: Lisa Duran MRN: [id number] DOB:1981/01/09, 43 y.o., female Today's Date: 09/15/2023   END OF SESSION:  PT End of Session - 09/15/23 0925     Visit Number 12    Number of Visits 22    Date for PT Re-Evaluation 10/10/23    Authorization Type [employer]    Progress Note Due on Visit 18    PT Start Time 0929    PT Stop Time 1011    PT Time Calculation (min) 42 min    Activity Tolerance Patient tolerated treatment well    Behavior During Therapy WFL for tasks assessed/performed               Past Medical History:  Diagnosis Date   ADHD (attention deficit hyperactivity disorder)    ALLERGIC RHINITIS    Allergy    Anemia    ANXIETY    Depression    DIABETES MELLITUS, TYPE II    Fibromyalgia 2018   GERD (gastroesophageal reflux disease)    MIGRAINE, COMMON    Obesity, unspecified    OVARIAN CYST    Past Surgical History:  Procedure Laterality Date   KNEE ARTHROSCOPY Left 08/25/2022   Patient Active Problem List   Diagnosis Date Noted   Tear of right acetabular labrum 07/21/2023   Cellulitis of abdominal wall 12/26/2022   B12 deficiency 05/09/2021   Dyslipidemia 04/14/2019   Enteritis 01/12/2019   Sleep walking disorder 04/06/2017   Iron  deficiency anemia 03/27/2017   Fibromyalgia 02/03/2017   GERD (gastroesophageal reflux disease) 06/24/2015   Eczema 06/22/2015   Depression 06/22/2015   Tachycardia 06/16/2014   Food allergy    ADHD (attention deficit hyperactivity disorder) 01/27/2011   Anxiety 02/20/2010   Allergic rhinitis 11/21/2009   OVARIAN CYST 11/21/2009   Diabetes mellitus type 2 with neurological manifestations (HCC) 11/20/2009   Class 2 obesity in adult 11/20/2009   Migraine without aura 11/20/2009    REFERRING PROVIDER:  Genelle Standing, MD     REFERRING DIAG: [phone] (ICD-10-CM) - Tear of right acetabular labrum, initial encounter   Post op labral repair  Rationale for Evaluation and  Treatment: Rehabilitation  THERAPY DIAG:  Pain in right hip  Difficulty in walking, not elsewhere classified  Muscle weakness (generalized)  ONSET DATE: 07/21/23   SUBJECTIVE:                                                                                                                                                                                           SUBJECTIVE STATEMENT: Patient states L knee injection helpful. Compensating causes L hip flexor to get  irritated. R knee has bothering her some too.    PERTINENT HISTORY:  Fibromyalgia, Iron  deficiency  PAIN:  Are you having pain? Yes: NPRS scale: Minimal/10 Pain location: Rt hip Pain description: ache Aggravating factors: Activation of hip flexors, sitting too long Relieving factors: stretching and getting weight off of knee  PRECAUTIONS:  None  RED FLAGS: None   WEIGHT BEARING RESTRICTIONS:  Yes touchdown weightbearing for 2 weeks  FALLS:  Has patient fallen in last 6 months? No  OCCUPATION:  Print production planner, has to lift boxes of medications  PLOF:  Independent  PATIENT GOALS:  Walk her dog that is about 100 pounds, hiking   OBJECTIVE:  Note: Objective measures were completed at Evaluation unless otherwise noted.  COGNITIVE STATUS: Within functional limits for tasks assessed   SENSATION: WFL  EDEMA:  No  POSTURE:  No Significant postural limitations  FOTO:  12/14: 42 08/28/23: 72 % function  GAIT:  12/14 Comments: Arrived with bilateral axillary crutches and hip brace with touchdown weightbearing precautions followed   LOWER EXTREMITY  ROM:  08/28/23 WFL all hip planes  Active ROM Right 08/28/2023 Left 08/28/2023  Hip flexion    Hip extension    Hip abduction    Hip adduction    Hip internal rotation    Hip external rotation    Knee flexion    Knee extension    Ankle dorsiflexion    Ankle plantarflexion    Ankle inversion    Ankle eversion     (Blank rows = not tested)*=  pain  LOWER EXTREMITY MMT:  MMT Right 08/28/2023 Left 08/28/2023  Hip flexion 55.4 52.6  Hip extension 60.7 62.2  Hip abduction 56.6 61.7  Hip adduction    Hip internal rotation    Hip external rotation    Knee flexion 44.4 55.5  Knee extension 70.8 70.6  Ankle dorsiflexion    Ankle plantarflexion    Ankle inversion    Ankle eversion     (Blank rows = not tested) *= pain  Reassessment 08/28/23 30 second STS 22 Reps without compensation SLS:  >40 seconds on RLE with good pelvic control  TREATMENT:                                                                                                                               09/15/23 Manual; STM to hip flexors, adductors Self MET- Lt hip flexors/Rt extensors, add/abd with belt and ball 10 x 5 second holds each SLS with vectors 3 x 5 second holds SLS with head turns  2 x 10  Hip hinge 10# 2 x 10  Staggered stance hip hinge 1 x 10   Treatment                            1/30:  Self MET- Lt hip flexors/Rt extensors, iso abd/add x3 rounds Trigger Point Dry Needling  Initial Treatment:  Pt instructed on Dry Needling rational, procedures, and possible side effects. Pt instructed to expect mild to moderate muscle soreness later in the day and/or into the next day.  Pt instructed in methods to reduce muscle soreness. Pt instructed to continue prescribed HEP. Patient verbalized understanding of these instructions and education.   Patient Verbal Consent Given: Yes Education Handout Provided: in pt instructions Muscles Treated: Rt glut max/med, piriformis Electrical Stimulation Performed: No Treatment Response/Outcome: twitch with decreased concordant pain Roller to Rt quads, STM to musculature addressed with DN Lt SIJ PA spring mob  Elliptical Leg press Mcconnell tape Lt patella lateral to medial pull   09/08/23 Elliptical 5 minutes - dynamic warm up Squat chair taps 2 x 10  Standing hip hinge dowel 1x 10, 3# 2 x 10 Plank on  knees 3 x 20 second holds SLS with vectors 3 x 5 second holds  Treatment                            1/21:  Mcconnell for lateral to medial patellar tracking on left side Ball bw knees- squat to tap table, also with ball removed Single leg lateral step down 4 SLS with head turns Heel raises ball bw ankles Gait- trunk rotation, arm swing, hinge forward slightly Dead lift review   Sep 26, 2023 Reassessment  Standing hip hinge 3 x 10 STS with RLE bias 2 x 10  Lateral stepping 4 x 10 feet x 2  08/21/23 Sidelying hip abduction 2 x 10, circles CW/CCW 2 x 10 each Sidelying reverse clams 2 x 10 Tall kneeling hip hinge 2 x 10 1/2 kneeling glute/ab sets with overhead press 3# 2 x 10, with diagonal lift 3# 2 x 10  Squat chair taps 2 x 10    Treatment                            08/17/23:  Gait training- pressing Rt ASIS anteriorly rather than opening laterally at toe off Sidelying hip abd x10, circles x10   x2 Clam & reverse Prone hip ext + HS curl in extension Roller to ITB & lateral HS, quads- edu in self rolling Half kneel + Y,progressed yellow tband with fatigue Tall kneel + lat pull; + post resistance      PATIENT EDUCATION:  Education details: Anatomy of condition, POC, HEP, exercise form/rationale; 09/26/23: HEP, reassessment findings Person educated: Patient Education method: Explanation, Demonstration, Tactile cues, Verbal cues, and Handouts Education comprehension: verbalized understanding, returned demonstration, verbal cues required, and tactile cues required  HOME EXERCISE PROGRAM: [email or web address]-prehab program  New postop program: [email or web address]   ASSESSMENT:  CLINICAL IMPRESSION: Patient with hyperactive and tender hip flexors/adductors which improves with manual. METs for pelvic alignment with education on home performance. Continued with glute strength and single leg stability with emphasis on pelvic control. Demonstrating good hip hinge mechanics. LE fatigue evident at EOS.  Patient will continue to benefit from physical therapy in order to improve function and reduce impairment.      REHAB POTENTIAL: Good  CLINICAL DECISION MAKING: Stable/uncomplicated  EVALUATION COMPLEXITY: Low   GOALS: Goals reviewed with patient? Yes  SHORT TERM GOALS: Target date: 1/7  Pain free flexion to 90 Baseline: Goal status: MET  2.  Demo controlled quad set with hold Baseline:  Goal status: MET  3.  30s sit to stand without pain or compensation Baseline:  22 reps Goal status:  MET  4.  SLS 30s without compensation Baseline:  Goal status: MET  LONG TERM GOALS:  Able to demo step up on 6 step with level pelvis and proper form Baseline:  Goal status: INITIAL Week 8 2/4  2.  Will tolerate at least 3 min on elliptical, demonstrating good tolerance to repetitive weight bearing motion Baseline:  Goal status: INITIAL  Week 8 2/4  3.  Demonstrate proper form in at least 10 continuous lunges without increased pain Baseline:  Goal status: INITIAL  Week 12 3/4  4.  Demonstrate gentle, double and single foot plyometric motions with good proximal form Baseline:  Goal status: INITIAL Week 12 3/4  5.  Patient will demonstrate appropriate proximal control for navigating unstable surfaces and return to hiking Baseline:  Goal status: INITIAL   6.  Patient will be able to walk her dog without limitation by her hip Baseline:  Goal status: INITIAL    PLAN:  PT FREQUENCY: 1-2 visits per week  PT DURATION: POC date  PLANNED INTERVENTIONS: 97164- PT Re-evaluation, 97110-Therapeutic exercises, 97530- Therapeutic activity, 97112- Neuromuscular re-education, 97535- Self Care, [id number]- Manual therapy, [phone]- Gait training, [phone]- Aquatic Therapy, Patient/Family education, Balance training, Stair training, Taping, Dry Needling, Joint mobilization, Spinal mobilization, Scar mobilization, Cryotherapy, and Moist heat.  PLAN FOR NEXT SESSION: per protocol    Prentice RAMAN  Juwan Vences, PT 09/15/2023, 10:13 AM

## 2023-09-17 ENCOUNTER — Ambulatory Visit: Payer: 59 | Admitting: Internal Medicine

## 2023-09-18 ENCOUNTER — Telehealth (HOSPITAL_BASED_OUTPATIENT_CLINIC_OR_DEPARTMENT_OTHER): Payer: Self-pay | Admitting: Physical Therapy

## 2023-09-18 ENCOUNTER — Ambulatory Visit (HOSPITAL_BASED_OUTPATIENT_CLINIC_OR_DEPARTMENT_OTHER): Payer: 59 | Admitting: Physical Therapy

## 2023-09-18 ENCOUNTER — Encounter (HOSPITAL_BASED_OUTPATIENT_CLINIC_OR_DEPARTMENT_OTHER): Payer: Self-pay

## 2023-09-18 ENCOUNTER — Telehealth: Payer: Self-pay

## 2023-09-18 NOTE — Telephone Encounter (Signed)
 Patient no show, left message for patient to make her aware of missed appointment and reminded her of next appointment.   9:16 AM, 09/18/23 Beather Liming PT, DPT Physical Therapist at [facility]

## 2023-09-18 NOTE — Telephone Encounter (Signed)
 Durolane authorized for left knee   Specialist office visits, Durolane [id number] and procedures 20610/20611 are covered at 80% of the allowable. Deductibles do not apply to these services. If out of pocket is met, coverage goes to 100%. No pre-cert or referrals needed. Medical notes must be submitted with the claim. Include medical necessity, diagnosis, and all clinicals.  Document scanned

## 2023-09-22 ENCOUNTER — Encounter (HOSPITAL_BASED_OUTPATIENT_CLINIC_OR_DEPARTMENT_OTHER): Payer: Self-pay | Admitting: Physical Therapy

## 2023-09-22 ENCOUNTER — Ambulatory Visit (HOSPITAL_BASED_OUTPATIENT_CLINIC_OR_DEPARTMENT_OTHER): Payer: 59 | Admitting: Physical Therapy

## 2023-09-22 DIAGNOSIS — M25551 Pain in right hip: Secondary | ICD-10-CM | POA: Diagnosis not present

## 2023-09-22 DIAGNOSIS — M6281 Muscle weakness (generalized): Secondary | ICD-10-CM

## 2023-09-22 DIAGNOSIS — R262 Difficulty in walking, not elsewhere classified: Secondary | ICD-10-CM

## 2023-09-22 NOTE — Therapy (Signed)
OUTPATIENT PHYSICAL THERAPY TREATMENT   Patient Name: Lisa Duran MRN: [id number] DOB:03/31/1981, 43 y.o., female Today's Date: 09/22/2023   END OF SESSION:  PT End of Session - 09/22/23 0932     Visit Number 13    Number of Visits 22    Date for PT Re-Evaluation 10/10/23    Authorization Type [employer]    Progress Note Due on Visit 18    PT Start Time 0931    PT Stop Time 1012    PT Time Calculation (min) 41 min    Activity Tolerance Patient tolerated treatment well    Behavior During Therapy WFL for tasks assessed/performed               Past Medical History:  Diagnosis Date   ADHD (attention deficit hyperactivity disorder)    ALLERGIC RHINITIS    Allergy    Anemia    ANXIETY    Depression    DIABETES MELLITUS, TYPE II    Fibromyalgia 2018   GERD (gastroesophageal reflux disease)    MIGRAINE, COMMON    Obesity, unspecified    OVARIAN CYST    Past Surgical History:  Procedure Laterality Date   KNEE ARTHROSCOPY Left 08/25/2022   Patient Active Problem List   Diagnosis Date Noted   Tear of right acetabular labrum 07/21/2023   Cellulitis of abdominal wall 12/26/2022   B12 deficiency 05/09/2021   Dyslipidemia 04/14/2019   Enteritis 01/12/2019   Sleep walking disorder 04/06/2017   Iron deficiency anemia 03/27/2017   Fibromyalgia 02/03/2017   GERD (gastroesophageal reflux disease) 06/24/2015   Eczema 06/22/2015   Depression 06/22/2015   Tachycardia 06/16/2014   Food allergy    ADHD (attention deficit hyperactivity disorder) 01/27/2011   Anxiety 02/20/2010   Allergic rhinitis 11/21/2009   OVARIAN CYST 11/21/2009   Diabetes mellitus type 2 with neurological manifestations (HCC) 11/20/2009   Class 2 obesity in adult 11/20/2009   Migraine without aura 11/20/2009    REFERRING PROVIDER:  Huel Cote, MD     REFERRING DIAG: [phone] (ICD-10-CM) - Tear of right acetabular labrum, initial encounter   Post op labral repair  Rationale for Evaluation and  Treatment: Rehabilitation  THERAPY DIAG:  Pain in right hip  Difficulty in walking, not elsewhere classified  Muscle weakness (generalized)  ONSET DATE: 07/21/23   SUBJECTIVE:                                                                                                                                                                                           SUBJECTIVE STATEMENT: Left knee got really angry- got steroid injection on 2/3, feels a lot  better. Rt hip is sore but not bad, hip flexors are tight.   PERTINENT HISTORY:  Fibromyalgia, Iron deficiency  PAIN:  Are you having pain? Yes: NPRS scale: Minimal/10 Pain location: Rt hip Pain description: ache Aggravating factors: Activation of hip flexors, sitting too long Relieving factors: stretching and getting weight off of knee  PRECAUTIONS:  None  RED FLAGS: None   WEIGHT BEARING RESTRICTIONS:  Yes touchdown weightbearing for 2 weeks  FALLS:  Has patient fallen in last 6 months? No  OCCUPATION:  Print production planner, has to lift boxes of medications  PLOF:  Independent  PATIENT GOALS:  Walk her dog that is about 100 pounds, hiking   OBJECTIVE:  Note: Objective measures were completed at Evaluation unless otherwise noted.  COGNITIVE STATUS: Within functional limits for tasks assessed   SENSATION: WFL  EDEMA:  No  POSTURE:  No Significant postural limitations  FOTO:  12/14: 42 08/28/23: 72 % function  GAIT:  12/14 Comments: Arrived with bilateral axillary crutches and hip brace with touchdown weightbearing precautions followed   LOWER EXTREMITY  ROM:  08/28/23 WFL all hip planes  Active ROM Right 08/28/2023 Left 08/28/2023  Hip flexion    Hip extension    Hip abduction    Hip adduction    Hip internal rotation    Hip external rotation    Knee flexion    Knee extension    Ankle dorsiflexion    Ankle plantarflexion    Ankle inversion    Ankle eversion     (Blank rows = not tested)*=  pain  LOWER EXTREMITY MMT:  MMT Right 08/28/2023 Left 08/28/2023  Hip flexion 55.4 52.6  Hip extension 60.7 62.2  Hip abduction 56.6 61.7  Hip adduction    Hip internal rotation    Hip external rotation    Knee flexion 44.4 55.5  Knee extension 70.8 70.6  Ankle dorsiflexion    Ankle plantarflexion    Ankle inversion    Ankle eversion     (Blank rows = not tested) *= pain  Reassessment 08/28/23 30 second STS 22 Reps without compensation SLS:  >40 seconds on RLE with good pelvic control  TREATMENT:                                                                                                                               Treatment                            2/11: Blank lines following charge title = not provided on this treatment date.   Manual:  TPDN No IASTM Rt TFL, VL There-ex: Modified half kneel- knee on table, glut set for hip flexor stretch Resisted walking lateral & post cable.  Bent over row green loop- cues for glut set There-Act:  Self Care:  Nuro-Re-ed:  Gait Training: Weight shift into wall for glut set progressed to gait pattern in  full   09/15/23 Manual; STM to hip flexors, adductors Self MET- Lt hip flexors/Rt extensors, add/abd with belt and ball 10 x 5 second holds each SLS with vectors 3 x 5 second holds SLS with head turns  2 x 10  Hip hinge 10# 2 x 10  Staggered stance hip hinge 1 x 10   Treatment                            1/30:  Self MET- Lt hip flexors/Rt extensors, iso abd/add x3 rounds Trigger Point Dry Needling  Initial Treatment: Pt instructed on Dry Needling rational, procedures, and possible side effects. Pt instructed to expect mild to moderate muscle soreness later in the day and/or into the next day.  Pt instructed in methods to reduce muscle soreness. Pt instructed to continue prescribed HEP. Patient verbalized understanding of these instructions and education.   Patient Verbal Consent Given: Yes Education Handout  Provided: in pt instructions Muscles Treated: Rt glut max/med, piriformis Electrical Stimulation Performed: No Treatment Response/Outcome: twitch with decreased concordant pain Roller to Rt quads, STM to musculature addressed with DN Lt SIJ PA spring mob  Elliptical Leg press Mcconnell tape Lt patella lateral to medial pull   09/08/23 Elliptical 5 minutes - dynamic warm up Squat chair taps 2 x 10  Standing hip hinge dowel 1x 10, 3# 2 x 10 Plank on knees 3 x 20 second holds SLS with vectors 3 x 5 second holds  Treatment                            1/21:  Mcconnell for lateral to medial patellar tracking on left side Ball bw knees- squat to tap table, also with ball removed Single leg lateral step down 4" SLS with head turns Heel raises ball bw ankles Gait- trunk rotation, arm swing, hinge forward slightly Dead lift review   09/16/23 Reassessment  Standing hip hinge 3 x 10 STS with RLE bias 2 x 10  Lateral stepping 4 x 10 feet x 2  08/21/23 Sidelying hip abduction 2 x 10, circles CW/CCW 2 x 10 each Sidelying reverse clams 2 x 10 Tall kneeling hip hinge 2 x 10 1/2 kneeling glute/ab sets with overhead press 3# 2 x 10, with diagonal lift 3# 2 x 10  Squat chair taps 2 x 10    Treatment                            08/17/23:  Gait training- pressing Rt ASIS anteriorly rather than opening laterally at toe off Sidelying hip abd x10, circles x10   x2 Clam & reverse Prone hip ext + HS curl in extension Roller to ITB & lateral HS, quads- edu in self rolling Half kneel + Y,progressed yellow tband with fatigue Tall kneel + lat pull; + post resistance      PATIENT EDUCATION:  Education details: Anatomy of condition, POC, HEP, exercise form/rationale; 2023-09-16: HEP, reassessment findings Person educated: Patient Education method: Explanation, Demonstration, Tactile cues, Verbal cues, and Handouts Education comprehension: verbalized understanding, returned demonstration, verbal  cues required, and tactile cues required  HOME EXERCISE PROGRAM: [email or web address]-prehab program  New postop program: [email or web address]   ASSESSMENT:  CLINICAL IMPRESSION: Improved glut set with anterior progression of pelvis in stance phase.      REHAB POTENTIAL: Good  CLINICAL DECISION MAKING: Stable/uncomplicated  EVALUATION COMPLEXITY: Low   GOALS: Goals reviewed with patient? Yes  SHORT TERM GOALS: Target date: 1/7  Pain free flexion to 90 Baseline: Goal status: MET  2.  Demo controlled quad set with hold Baseline:  Goal status: MET  3.  30s sit to stand without pain or compensation Baseline:  22 reps Goal status: MET  4.  SLS 30s without compensation Baseline:  Goal status: MET  LONG TERM GOALS:  Able to demo step up on 6" step with level pelvis and proper form Baseline:  Goal status: INITIAL Week 8 2/4  2.  Will tolerate at least 3 min on elliptical, demonstrating good tolerance to repetitive weight bearing motion Baseline:  Goal status: INITIAL  Week 8 2/4  3.  Demonstrate proper form in at least 10 continuous lunges without increased pain Baseline:  Goal status: INITIAL  Week 12 3/4  4.  Demonstrate gentle, double and single foot plyometric motions with good proximal form Baseline:  Goal status: INITIAL Week 12 3/4  5.  Patient will demonstrate appropriate proximal control for navigating unstable surfaces and return to hiking Baseline:  Goal status: INITIAL   6.  Patient will be able to walk her dog without limitation by her hip Baseline:  Goal status: INITIAL    PLAN:  PT FREQUENCY: 1-2 visits per week  PT DURATION: POC date  PLANNED INTERVENTIONS: 97164- PT Re-evaluation, 97110-Therapeutic exercises, 97530- Therapeutic activity, 97112- Neuromuscular re-education, 97535- Self Care, 40981- Manual therapy, [phone]- Gait training, [phone]- Aquatic Therapy, Patient/Family education, Balance training, Stair training, Taping, Dry Needling, Joint mobilization,  Spinal mobilization, Scar mobilization, Cryotherapy, and Moist heat.  PLAN FOR NEXT SESSION: per protocol    Saray Capasso C. Angie Piercey PT, DPT 09/22/23 10:13 AM

## 2023-09-25 ENCOUNTER — Ambulatory Visit (HOSPITAL_BASED_OUTPATIENT_CLINIC_OR_DEPARTMENT_OTHER): Payer: 59 | Admitting: Physical Therapy

## 2023-09-29 ENCOUNTER — Encounter (HOSPITAL_BASED_OUTPATIENT_CLINIC_OR_DEPARTMENT_OTHER): Payer: 59 | Admitting: Physical Therapy

## 2023-10-02 ENCOUNTER — Ambulatory Visit (HOSPITAL_BASED_OUTPATIENT_CLINIC_OR_DEPARTMENT_OTHER): Payer: 59 | Admitting: Physical Therapy

## 2023-10-02 ENCOUNTER — Encounter (HOSPITAL_BASED_OUTPATIENT_CLINIC_OR_DEPARTMENT_OTHER): Payer: Self-pay | Admitting: Physical Therapy

## 2023-10-02 DIAGNOSIS — M25551 Pain in right hip: Secondary | ICD-10-CM

## 2023-10-02 DIAGNOSIS — M6281 Muscle weakness (generalized): Secondary | ICD-10-CM

## 2023-10-02 DIAGNOSIS — R262 Difficulty in walking, not elsewhere classified: Secondary | ICD-10-CM

## 2023-10-02 NOTE — Therapy (Signed)
OUTPATIENT PHYSICAL THERAPY TREATMENT   Patient Name: Lisa Duran MRN: [id number] DOB:1980/12/24, 43 y.o., female Today's Date: 10/02/2023   END OF SESSION:  PT End of Session - 10/02/23 0928     Visit Number 14    Number of Visits 22    Date for PT Re-Evaluation 10/10/23    Authorization Type [employer]    Progress Note Due on Visit 18    PT Start Time 0932    PT Stop Time 1012    PT Time Calculation (min) 40 min    Activity Tolerance Patient tolerated treatment well    Behavior During Therapy WFL for tasks assessed/performed               Past Medical History:  Diagnosis Date   ADHD (attention deficit hyperactivity disorder)    ALLERGIC RHINITIS    Allergy    Anemia    ANXIETY    Depression    DIABETES MELLITUS, TYPE II    Fibromyalgia 2018   GERD (gastroesophageal reflux disease)    MIGRAINE, COMMON    Obesity, unspecified    OVARIAN CYST    Past Surgical History:  Procedure Laterality Date   KNEE ARTHROSCOPY Left 08/25/2022   Patient Active Problem List   Diagnosis Date Noted   Tear of right acetabular labrum 07/21/2023   Cellulitis of abdominal wall 12/26/2022   B12 deficiency 05/09/2021   Dyslipidemia 04/14/2019   Enteritis 01/12/2019   Sleep walking disorder 04/06/2017   Iron deficiency anemia 03/27/2017   Fibromyalgia 02/03/2017   GERD (gastroesophageal reflux disease) 06/24/2015   Eczema 06/22/2015   Depression 06/22/2015   Tachycardia 06/16/2014   Food allergy    ADHD (attention deficit hyperactivity disorder) 01/27/2011   Anxiety 02/20/2010   Allergic rhinitis 11/21/2009   OVARIAN CYST 11/21/2009   Diabetes mellitus type 2 with neurological manifestations (HCC) 11/20/2009   Class 2 obesity in adult 11/20/2009   Migraine without aura 11/20/2009    REFERRING PROVIDER:  Huel Cote, MD     REFERRING DIAG: [phone] (ICD-10-CM) - Tear of right acetabular labrum, initial encounter   Post op labral repair  Rationale for Evaluation and  Treatment: Rehabilitation  THERAPY DIAG:  Pain in right hip  Difficulty in walking, not elsewhere classified  Muscle weakness (generalized)  ONSET DATE: 07/21/23   SUBJECTIVE:                                                                                                                                                                                           SUBJECTIVE STATEMENT: Patient states L knee hurts and head hurts today. Injection helped but yesterday  knee pain started again. Hip is sore but trying to walk better. Doing HEP.   PERTINENT HISTORY:  Fibromyalgia, Iron deficiency  PAIN:  Are you having pain? Yes: NPRS scale: Minimal/10 Pain location: Rt hip Pain description: ache Aggravating factors: Activation of hip flexors, sitting too long Relieving factors: stretching and getting weight off of knee  PRECAUTIONS:  None  RED FLAGS: None   WEIGHT BEARING RESTRICTIONS:  Yes touchdown weightbearing for 2 weeks  FALLS:  Has patient fallen in last 6 months? No  OCCUPATION:  Print production planner, has to lift boxes of medications  PLOF:  Independent  PATIENT GOALS:  Walk her dog that is about 100 pounds, hiking   OBJECTIVE:  Note: Objective measures were completed at Evaluation unless otherwise noted.  COGNITIVE STATUS: Within functional limits for tasks assessed   SENSATION: WFL  EDEMA:  No  POSTURE:  No Significant postural limitations  FOTO:  12/14: 42 08/28/23: 72 % function  GAIT:  12/14 Comments: Arrived with bilateral axillary crutches and hip brace with touchdown weightbearing precautions followed   LOWER EXTREMITY  ROM:  08/28/23 WFL all hip planes  Active ROM Right 08/28/2023 Left 08/28/2023  Hip flexion    Hip extension    Hip abduction    Hip adduction    Hip internal rotation    Hip external rotation    Knee flexion    Knee extension    Ankle dorsiflexion    Ankle plantarflexion    Ankle inversion    Ankle eversion      (Blank rows = not tested)*= pain  LOWER EXTREMITY MMT:  MMT Right 08/28/2023 Left 08/28/2023  Hip flexion 55.4 52.6  Hip extension 60.7 62.2  Hip abduction 56.6 61.7  Hip adduction    Hip internal rotation    Hip external rotation    Knee flexion 44.4 55.5  Knee extension 70.8 70.6  Ankle dorsiflexion    Ankle plantarflexion    Ankle inversion    Ankle eversion     (Blank rows = not tested) *= pain  Reassessment 08/28/23 30 second STS 22 Reps without compensation SLS:  >40 seconds on RLE with good pelvic control  TREATMENT:                                                                                                                              10/02/23 Single leg hip hinge 2 x 10 Split stance hip hinge with row 5# 2 x 10  Shuttle SL 2 x 10 50#; sidelying 2 x 10 37# Manual: STM to R hip flexors/ adductors  Treatment                            2/11: Blank lines following charge title = not provided on this treatment date.   Manual:  TPDN No IASTM Rt TFL, VL There-ex: Modified half kneel- knee on table, glut set for hip  flexor stretch Resisted walking lateral & post cable.  Bent over row green loop- cues for glut set There-Act:  Self Care:  Nuro-Re-ed:  Gait Training: Weight shift into wall for glut set progressed to gait pattern in full   09/15/23 Manual; STM to hip flexors, adductors Self MET- Lt hip flexors/Rt extensors, add/abd with belt and ball 10 x 5 second holds each SLS with vectors 3 x 5 second holds SLS with head turns  2 x 10  Hip hinge 10# 2 x 10  Staggered stance hip hinge 1 x 10   Treatment                            1/30:  Self MET- Lt hip flexors/Rt extensors, iso abd/add x3 rounds Trigger Point Dry Needling  Initial Treatment: Pt instructed on Dry Needling rational, procedures, and possible side effects. Pt instructed to expect mild to moderate muscle soreness later in the day and/or into the next day.  Pt instructed in methods to reduce  muscle soreness. Pt instructed to continue prescribed HEP. Patient verbalized understanding of these instructions and education.   Patient Verbal Consent Given: Yes Education Handout Provided: in pt instructions Muscles Treated: Rt glut max/med, piriformis Electrical Stimulation Performed: No Treatment Response/Outcome: twitch with decreased concordant pain Roller to Rt quads, STM to musculature addressed with DN Lt SIJ PA spring mob  Elliptical Leg press Mcconnell tape Lt patella lateral to medial pull   09/08/23 Elliptical 5 minutes - dynamic warm up Squat chair taps 2 x 10  Standing hip hinge dowel 1x 10, 3# 2 x 10 Plank on knees 3 x 20 second holds SLS with vectors 3 x 5 second holds  Treatment                            1/21:  Mcconnell for lateral to medial patellar tracking on left side Ball bw knees- squat to tap table, also with ball removed Single leg lateral step down 4" SLS with head turns Heel raises ball bw ankles Gait- trunk rotation, arm swing, hinge forward slightly Dead lift review   Aug 29, 2023 Reassessment  Standing hip hinge 3 x 10 STS with RLE bias 2 x 10  Lateral stepping 4 x 10 feet x 2  08/21/23 Sidelying hip abduction 2 x 10, circles CW/CCW 2 x 10 each Sidelying reverse clams 2 x 10 Tall kneeling hip hinge 2 x 10 1/2 kneeling glute/ab sets with overhead press 3# 2 x 10, with diagonal lift 3# 2 x 10  Squat chair taps 2 x 10    Treatment                            08/17/23:  Gait training- pressing Rt ASIS anteriorly rather than opening laterally at toe off Sidelying hip abd x10, circles x10   x2 Clam & reverse Prone hip ext + HS curl in extension Roller to ITB & lateral HS, quads- edu in self rolling Half kneel + Y,progressed yellow tband with fatigue Tall kneel + lat pull; + post resistance      PATIENT EDUCATION:  Education details: Anatomy of condition, POC, HEP, exercise form/rationale; 2023-08-29: HEP, reassessment findings Person  educated: Patient Education method: Explanation, Demonstration, Tactile cues, Verbal cues, and Handouts Education comprehension: verbalized understanding, returned demonstration, verbal cues required, and tactile cues required  HOME EXERCISE  PROGRAM: [email or web address]-prehab program  New postop program: [email or web address]   ASSESSMENT:  CLINICAL IMPRESSION: Continued with glute strengthening which is tolerated well. Emphasis on single leg exercises today due to L knee pain. Fatigue 8/10 at end of session. Manual performed with decrease in tissue tension.  Patient will continue to benefit from physical therapy in order to improve function and reduce impairment.      REHAB POTENTIAL: Good  CLINICAL DECISION MAKING: Stable/uncomplicated  EVALUATION COMPLEXITY: Low   GOALS: Goals reviewed with patient? Yes  SHORT TERM GOALS: Target date: 1/7  Pain free flexion to 90 Baseline: Goal status: MET  2.  Demo controlled quad set with hold Baseline:  Goal status: MET  3.  30s sit to stand without pain or compensation Baseline:  22 reps Goal status: MET  4.  SLS 30s without compensation Baseline:  Goal status: MET  LONG TERM GOALS:  Able to demo step up on 6" step with level pelvis and proper form Baseline:  Goal status: INITIAL Week 8 2/4  2.  Will tolerate at least 3 min on elliptical, demonstrating good tolerance to repetitive weight bearing motion Baseline:  Goal status: INITIAL  Week 8 2/4  3.  Demonstrate proper form in at least 10 continuous lunges without increased pain Baseline:  Goal status: INITIAL  Week 12 3/4  4.  Demonstrate gentle, double and single foot plyometric motions with good proximal form Baseline:  Goal status: INITIAL Week 12 3/4  5.  Patient will demonstrate appropriate proximal control for navigating unstable surfaces and return to hiking Baseline:  Goal status: INITIAL   6.  Patient will be able to walk her dog without limitation by her hip Baseline:   Goal status: INITIAL    PLAN:  PT FREQUENCY: 1-2 visits per week  PT DURATION: POC date  PLANNED INTERVENTIONS: 97164- PT Re-evaluation, 97110-Therapeutic exercises, 97530- Therapeutic activity, 97112- Neuromuscular re-education, 97535- Self Care, 16109- Manual therapy, [phone]- Gait training, [phone]- Aquatic Therapy, Patient/Family education, Balance training, Stair training, Taping, Dry Needling, Joint mobilization, Spinal mobilization, Scar mobilization, Cryotherapy, and Moist heat.  PLAN FOR NEXT SESSION: per protocol     Wyman Songster, PT 10/02/2023, 10:17 AM

## 2023-10-06 ENCOUNTER — Ambulatory Visit (HOSPITAL_BASED_OUTPATIENT_CLINIC_OR_DEPARTMENT_OTHER): Payer: 59 | Admitting: Physical Therapy

## 2023-10-06 ENCOUNTER — Encounter (HOSPITAL_BASED_OUTPATIENT_CLINIC_OR_DEPARTMENT_OTHER): Payer: Self-pay | Admitting: Physical Therapy

## 2023-10-06 DIAGNOSIS — M25551 Pain in right hip: Secondary | ICD-10-CM | POA: Diagnosis not present

## 2023-10-06 DIAGNOSIS — M6281 Muscle weakness (generalized): Secondary | ICD-10-CM

## 2023-10-06 DIAGNOSIS — R262 Difficulty in walking, not elsewhere classified: Secondary | ICD-10-CM

## 2023-10-06 NOTE — Therapy (Signed)
 OUTPATIENT PHYSICAL THERAPY TREATMENT   Patient Name: Lisa Duran MRN: [id number] DOB:[birth year], 43 y.o., female Today's Date: 10/06/2023   END OF SESSION:  PT End of Session - 10/06/23 0932     Visit Number 15    Number of Visits 22    Date for PT Re-Evaluation 10/10/23    Authorization Type [employer]    Progress Note Due on Visit 18    PT Start Time 0931    PT Stop Time 1009    PT Time Calculation (min) 38 min    Activity Tolerance Patient tolerated treatment well    Behavior During Therapy WFL for tasks assessed/performed                Past Medical History:  Diagnosis Date   ADHD (attention deficit hyperactivity disorder)    ALLERGIC RHINITIS    Allergy    Anemia    ANXIETY    Depression    DIABETES MELLITUS, TYPE II    Fibromyalgia 2018   GERD (gastroesophageal reflux disease)    MIGRAINE, COMMON    Obesity, unspecified    OVARIAN CYST    Past Surgical History:  Procedure Laterality Date   KNEE ARTHROSCOPY Left 08/25/2022   Patient Active Problem List   Diagnosis Date Noted   Tear of right acetabular labrum 07/21/2023   Cellulitis of abdominal wall 12/26/2022   B12 deficiency 05/09/2021   Dyslipidemia 04/14/2019   Enteritis 01/12/2019   Sleep walking disorder 04/06/2017   Iron deficiency anemia 03/27/2017   Fibromyalgia 02/03/2017   GERD (gastroesophageal reflux disease) 06/24/2015   Eczema 06/22/2015   Depression 06/22/2015   Tachycardia 06/16/2014   Food allergy    ADHD (attention deficit hyperactivity disorder) 01/27/2011   Anxiety 02/20/2010   Allergic rhinitis 11/21/2009   OVARIAN CYST 11/21/2009   Diabetes mellitus type 2 with neurological manifestations (HCC) 11/20/2009   Class 2 obesity in adult 11/20/2009   Migraine without aura 11/20/2009    REFERRING PROVIDER:  Huel Cote, MD     REFERRING DIAG: [phone] (ICD-10-CM) - Tear of right acetabular labrum, initial encounter   Post op labral repair  Rationale for Evaluation  and Treatment: Rehabilitation  THERAPY DIAG:  Pain in right hip  Difficulty in walking, not elsewhere classified  Muscle weakness (generalized)  ONSET DATE: 07/21/23 Days since surgery: 77    SUBJECTIVE:                                                                                                                                                                                           SUBJECTIVE STATEMENT: Overall it is feeling good. The hip  flexor still just gets tight. Balance still needs to improve. Left knee is irritated but not as bad as it has been. I feel more confident.   PERTINENT HISTORY:  Fibromyalgia, Iron deficiency  PAIN:  Are you having pain? Yes: NPRS scale: Minimal/10 Pain location: Rt hip Pain description: ache Aggravating factors: Activation of hip flexors, sitting too long Relieving factors: stretching and getting weight off of knee  PRECAUTIONS:  None  RED FLAGS: None   WEIGHT BEARING RESTRICTIONS:  Yes touchdown weightbearing for 2 weeks  FALLS:  Has patient fallen in last 6 months? No  OCCUPATION:  Print production planner, has to lift boxes of medications  PLOF:  Independent  PATIENT GOALS:  Walk her dog that is about 100 pounds, hiking   OBJECTIVE:  Note: Objective measures were completed at Evaluation unless otherwise noted.  COGNITIVE STATUS: Within functional limits for tasks assessed   SENSATION: WFL  EDEMA:  No  POSTURE:  No Significant postural limitations  FOTO:  12/14: 42 08/28/23: 72 % function  GAIT:  12/14 Comments: Arrived with bilateral axillary crutches and hip brace with touchdown weightbearing precautions followed   LOWER EXTREMITY  ROM:  08/28/23 WFL all hip planes  Active ROM Right 08/28/2023 Left 08/28/2023  Hip flexion    Hip extension    Hip abduction    Hip adduction    Hip internal rotation    Hip external rotation    Knee flexion    Knee extension    Ankle dorsiflexion    Ankle plantarflexion     Ankle inversion    Ankle eversion     (Blank rows = not tested)*= pain  LOWER EXTREMITY MMT:  MMT Right 08/28/2023 Left 08/28/2023  Hip flexion 55.4 52.6  Hip extension 60.7 62.2  Hip abduction 56.6 61.7  Hip adduction    Hip internal rotation    Hip external rotation    Knee flexion 44.4 55.5  Knee extension 70.8 70.6  Ankle dorsiflexion    Ankle plantarflexion    Ankle inversion    Ankle eversion     (Blank rows = not tested) *= pain  Reassessment 08/28/23 30 second STS 22 Reps without compensation SLS:  >40 seconds on RLE with good pelvic control  TREATMENT:                                                                                                                              Treatment                            2/25: Blank lines following charge title = not provided on this treatment date.   Manual:  TPDN No  There-ex: Reviewed all HEP There-Act:  Self Care: Importance of awareness, stretching Nuro-Re-ed:  Gait Training:    10/02/23 Single leg hip hinge 2 x 10 Split stance hip hinge with row 5# 2 x 10  Shuttle SL  2 x 10 50#; sidelying 2 x 10 37# Manual: STM to R hip flexors/ adductors  Treatment                            2/11: Blank lines following charge title = not provided on this treatment date.   Manual:  TPDN No IASTM Rt TFL, VL There-ex: Modified half kneel- knee on table, glut set for hip flexor stretch Resisted walking lateral & post cable.  Bent over row green loop- cues for glut set There-Act:  Self Care:  Nuro-Re-ed:  Gait Training: Weight shift into wall for glut set progressed to gait pattern in full   09/15/23 Manual; STM to hip flexors, adductors Self MET- Lt hip flexors/Rt extensors, add/abd with belt and ball 10 x 5 second holds each SLS with vectors 3 x 5 second holds SLS with head turns  2 x 10  Hip hinge 10# 2 x 10  Staggered stance hip hinge 1 x 10    PATIENT EDUCATION:  Education details: Anatomy of  condition, POC, HEP, exercise form/rationale; 08/28/23: HEP, reassessment findings Person educated: Patient Education method: Explanation, Demonstration, Tactile cues, Verbal cues, and Handouts Education comprehension: verbalized understanding, returned demonstration, verbal cues required, and tactile cues required  HOME EXERCISE PROGRAM: [email or web address]-prehab program  New postop program: [id number]   ASSESSMENT:  CLINICAL IMPRESSION: Added balance and post chain engagement to HEP. Next visit is D/C and is prepared to begin aqua aerobics on Sat.      REHAB POTENTIAL: Good  CLINICAL DECISION MAKING: Stable/uncomplicated  EVALUATION COMPLEXITY: Low   GOALS: Goals reviewed with patient? Yes  SHORT TERM GOALS: Target date: 1/7  Pain free flexion to 90 Baseline: Goal status: MET  2.  Demo controlled quad set with hold Baseline:  Goal status: MET  3.  30s sit to stand without pain or compensation Baseline:  22 reps Goal status: MET  4.  SLS 30s without compensation Baseline:  Goal status: MET  LONG TERM GOALS:  Able to demo step up on 6" step with level pelvis and proper form Baseline:  Goal status: INITIAL Week 8 2/4  2.  Will tolerate at least 3 min on elliptical, demonstrating good tolerance to repetitive weight bearing motion Baseline:  Goal status: INITIAL  Week 8 2/4  3.  Demonstrate proper form in at least 10 continuous lunges without increased pain Baseline:  Goal status: INITIAL  Week 12 3/4  4.  Demonstrate gentle, double and single foot plyometric motions with good proximal form Baseline:  Goal status: INITIAL Week 12 3/4  5.  Patient will demonstrate appropriate proximal control for navigating unstable surfaces and return to hiking Baseline:  Goal status: INITIAL   6.  Patient will be able to walk her dog without limitation by her hip Baseline:  Goal status: INITIAL    PLAN:  PT FREQUENCY: 1-2 visits per week  PT DURATION: POC date  PLANNED  INTERVENTIONS: 97164- PT Re-evaluation, 97110-Therapeutic exercises, 97530- Therapeutic activity, 97112- Neuromuscular re-education, 97535- Self Care, 40981- Manual therapy, [phone]- Gait training, [phone]- Aquatic Therapy, Patient/Family education, Balance training, Stair training, Taping, Dry Needling, Joint mobilization, Spinal mobilization, Scar mobilization, Cryotherapy, and Moist heat.  PLAN FOR NEXT SESSION: d/c    Lisa Duran PT, DPT 10/06/23 10:11 AM

## 2023-10-08 ENCOUNTER — Other Ambulatory Visit: Payer: Self-pay | Admitting: Internal Medicine

## 2023-10-08 DIAGNOSIS — E1149 Type 2 diabetes mellitus with other diabetic neurological complication: Secondary | ICD-10-CM

## 2023-10-09 ENCOUNTER — Encounter (HOSPITAL_BASED_OUTPATIENT_CLINIC_OR_DEPARTMENT_OTHER): Payer: 59 | Admitting: Orthopaedic Surgery

## 2023-10-09 ENCOUNTER — Encounter (HOSPITAL_BASED_OUTPATIENT_CLINIC_OR_DEPARTMENT_OTHER): Payer: Self-pay | Admitting: Physical Therapy

## 2023-10-09 ENCOUNTER — Ambulatory Visit (HOSPITAL_BASED_OUTPATIENT_CLINIC_OR_DEPARTMENT_OTHER): Payer: 59 | Admitting: Physical Therapy

## 2023-10-09 DIAGNOSIS — M25551 Pain in right hip: Secondary | ICD-10-CM

## 2023-10-09 DIAGNOSIS — R262 Difficulty in walking, not elsewhere classified: Secondary | ICD-10-CM

## 2023-10-09 DIAGNOSIS — M6281 Muscle weakness (generalized): Secondary | ICD-10-CM

## 2023-10-09 NOTE — Therapy (Signed)
 OUTPATIENT PHYSICAL THERAPY TREATMENT   Patient Name: Lisa Duran MRN: [id number] DOB:1980-10-05, 43 y.o., female Today's Date: 10/09/2023 PHYSICAL THERAPY DISCHARGE SUMMARY  Visits from Start of Care: 16  Current functional level related to goals / functional outcomes: See below   Remaining deficits: See below   Education / Equipment: See below   Patient agrees to discharge. Patient goals were met. Patient is being discharged due to meeting the stated rehab goals.Ready to transition to HEP and self management.    Progress Note   Reporting Period 08/28/23 to 10/09/23   See note below for Objective Data and Assessment of Progress/Goals   END OF SESSION:  PT End of Session - 10/09/23 0929     Visit Number 16    Number of Visits 22    Date for PT Re-Evaluation 10/10/23    Authorization Type [employer]    Progress Note Due on Visit 18    PT Start Time 0929    PT Stop Time 1000    PT Time Calculation (min) 31 min    Activity Tolerance Patient tolerated treatment well    Behavior During Therapy WFL for tasks assessed/performed                Past Medical History:  Diagnosis Date   ADHD (attention deficit hyperactivity disorder)    ALLERGIC RHINITIS    Allergy    Anemia    ANXIETY    Depression    DIABETES MELLITUS, TYPE II    Fibromyalgia 2018   GERD (gastroesophageal reflux disease)    MIGRAINE, COMMON    Obesity, unspecified    OVARIAN CYST    Past Surgical History:  Procedure Laterality Date   KNEE ARTHROSCOPY Left 08/25/2022   Patient Active Problem List   Diagnosis Date Noted   Tear of right acetabular labrum 07/21/2023   Cellulitis of abdominal wall 12/26/2022   B12 deficiency 05/09/2021   Dyslipidemia 04/14/2019   Enteritis 01/12/2019   Sleep walking disorder 04/06/2017   Iron deficiency anemia 03/27/2017   Fibromyalgia 02/03/2017   GERD (gastroesophageal reflux disease) 06/24/2015   Eczema 06/22/2015   Depression 06/22/2015   Tachycardia  06/16/2014   Food allergy    ADHD (attention deficit hyperactivity disorder) 01/27/2011   Anxiety 02/20/2010   Allergic rhinitis 11/21/2009   OVARIAN CYST 11/21/2009   Diabetes mellitus type 2 with neurological manifestations (HCC) 11/20/2009   Class 2 obesity in adult 11/20/2009   Migraine without aura 11/20/2009    REFERRING PROVIDER:  Huel Cote, MD     REFERRING DIAG: [phone] (ICD-10-CM) - Tear of right acetabular labrum, initial encounter   Post op labral repair  Rationale for Evaluation and Treatment: Rehabilitation  THERAPY DIAG:  Pain in right hip  Difficulty in walking, not elsewhere classified  Muscle weakness (generalized)  ONSET DATE: 07/21/23 Days since surgery: 80    SUBJECTIVE:  SUBJECTIVE STATEMENT: Patient states overall feeling good. L knee is hurting. Hip is feeling good. Sore today due to having to drive a lot yesterday. Ready to d/c to HEP. Ready to get back to exercise and hiking.   PERTINENT HISTORY:  Fibromyalgia, Iron deficiency  PAIN:  Are you having pain? Yes: NPRS scale: Minimal/10 Pain location: Rt hip Pain description: ache Aggravating factors: Activation of hip flexors, sitting too long Relieving factors: stretching and getting weight off of knee  PRECAUTIONS:  None  RED FLAGS: None   WEIGHT BEARING RESTRICTIONS:  Yes touchdown weightbearing for 2 weeks  FALLS:  Has patient fallen in last 6 months? No  OCCUPATION:  Print production planner, has to lift boxes of medications  PLOF:  Independent  PATIENT GOALS:  Walk her dog that is about 100 pounds, hiking   OBJECTIVE:  Note: Objective measures were completed at Evaluation unless otherwise noted.  COGNITIVE STATUS: Within functional limits for tasks  assessed   SENSATION: WFL  EDEMA:  No  POSTURE:  No Significant postural limitations  FOTO:  12/14: 42 08/28/23: 72 % function 10/09/23: 74 % function  GAIT:  12/14 Comments: Arrived with bilateral axillary crutches and hip brace with touchdown weightbearing precautions followed   LOWER EXTREMITY  ROM:  08/28/23 WFL all hip planes  Active ROM Right 08/28/2023 Left 08/28/2023  Hip flexion    Hip extension    Hip abduction    Hip adduction    Hip internal rotation    Hip external rotation    Knee flexion    Knee extension    Ankle dorsiflexion    Ankle plantarflexion    Ankle inversion    Ankle eversion     (Blank rows = not tested)*= pain  LOWER EXTREMITY MMT:  MMT Right 08/28/2023 Left 08/28/2023 Right 10/09/23 Left 10/09/23  Hip flexion 55.4 52.6 60 53.2  Hip extension 60.7 62.2 70 77.4  Hip abduction 56.6 61.7 59.8 72.5  Hip adduction      Hip internal rotation      Hip external rotation      Knee flexion 44.4 55.5 55.4 66.6  Knee extension 70.8 70.6 84.8 105.8  Ankle dorsiflexion      Ankle plantarflexion      Ankle inversion      Ankle eversion       (Blank rows = not tested) *= pain  Reassessment 08/28/23 30 second STS 22 Reps without compensation SLS:  >40 seconds on RLE with good pelvic control  TREATMENT:                                                                                                                              10/09/23 Reassessment Education   Treatment                            2/25: Blank lines following charge title = not provided on this  treatment date.   Manual:  TPDN No  There-ex: Reviewed all HEP There-Act:  Self Care: Importance of awareness, stretching Nuro-Re-ed:  Gait Training:    10/02/23 Single leg hip hinge 2 x 10 Split stance hip hinge with row 5# 2 x 10  Shuttle SL 2 x 10 50#; sidelying 2 x 10 37# Manual: STM to R hip flexors/ adductors  Treatment                            2/11: Blank lines  following charge title = not provided on this treatment date.   Manual:  TPDN No IASTM Rt TFL, VL There-ex: Modified half kneel- knee on table, glut set for hip flexor stretch Resisted walking lateral & post cable.  Bent over row green loop- cues for glut set There-Act:  Self Care:  Nuro-Re-ed:  Gait Training: Weight shift into wall for glut set progressed to gait pattern in full   09/15/23 Manual; STM to hip flexors, adductors Self MET- Lt hip flexors/Rt extensors, add/abd with belt and ball 10 x 5 second holds each SLS with vectors 3 x 5 second holds SLS with head turns  2 x 10  Hip hinge 10# 2 x 10  Staggered stance hip hinge 1 x 10    PATIENT EDUCATION:  Education details: Anatomy of condition, POC, HEP, exercise form/rationale; 08/28/23: HEP, reassessment findings 10/09/23: reassessment findings, HEP, aquatic progressions/regressions, returning to PT if needed Person educated: Patient Education method: Explanation, Demonstration, Tactile cues, Verbal cues, and Handouts Education comprehension: verbalized understanding, returned demonstration, verbal cues required, and tactile cues required  HOME EXERCISE PROGRAM: [email or web address]-prehab program  [facility] postop program: [facility]   ASSESSMENT:  CLINICAL IMPRESSION: Patient has met 4/4 short term goals and 5/5 long term goals with ability to complete HEP and improvement in symptoms, strength, ROM, activity tolerance, gait, balance, and functional mobility. One goal deferred due to ongoing knee pain which has been limiting patient progressions overall. Patient doing well at this point and feels ready to transition to HEP and self management. Patient discharged from PT at this time.      REHAB POTENTIAL: Good  CLINICAL DECISION MAKING: Stable/uncomplicated  EVALUATION COMPLEXITY: Low   GOALS: Goals reviewed with patient? Yes  SHORT TERM GOALS: Target date: 1/7  Pain free flexion to 90 Baseline: Goal status: MET  2.   Demo controlled quad set with hold Baseline:  Goal status: MET  3.  30s sit to stand without pain or compensation Baseline:  22 reps Goal status: MET  4.  SLS 30s without compensation Baseline:  Goal status: MET  LONG TERM GOALS:  Able to demo step up on 6" step with level pelvis and proper form Baseline:  Goal status: INITIAL Week 8 2/4; 10/09/23: MET  2.  Will tolerate at least 3 min on elliptical, demonstrating good tolerance to repetitive weight bearing motion Baseline:  Goal status:   Week 8 2/4 MET - 10/09/23 (previously completed with no issue with R hip  3.  Demonstrate proper form in at least 10 continuous lunges without increased pain Baseline:  Goal status: INITIAL  Week 12 3/4 10/09/23: MET - challenging but able - hip good, knees painful  4.  Demonstrate gentle, double and single foot plyometric motions with good proximal form Baseline:  Goal status: INITIAL Week 12 3/4 - 10/09/23: deferred due to knee symptoms  5.  Patient will demonstrate appropriate proximal control for navigating unstable surfaces  and return to hiking Baseline:  Goal status: MET 10/09/23  6.  Patient will be able to walk her dog without limitation by her hip Baseline:  Goal status: MET 10/09/23    PLAN:  PT FREQUENCY: 1-2 visits per week  PT DURATION: POC date  PLANNED INTERVENTIONS: 97164- PT Re-evaluation, 97110-Therapeutic exercises, 97530- Therapeutic activity, 97112- Neuromuscular re-education, 97535- Self Care, 16109- Manual therapy, [phone]- Gait training, [phone]- Aquatic Therapy, Patient/Family education, Balance training, Stair training, Taping, Dry Needling, Joint mobilization, Spinal mobilization, Scar mobilization, Cryotherapy, and Moist heat.  PLAN FOR NEXT SESSION: Gloriajean Dell Elius Etheredge, PT 10/09/2023, 10:07 AM

## 2023-10-12 IMAGING — DX DG KNEE AP/LAT W/ SUNRISE*L*
3 series · 3 of 3 positions shown · non-contrast
Comparison: None Available.

CLINICAL DATA: Left knee pain. Patient reports worsening pain for 3
months.

EXAM:
LEFT KNEE 3 VIEWS

[knee ap]
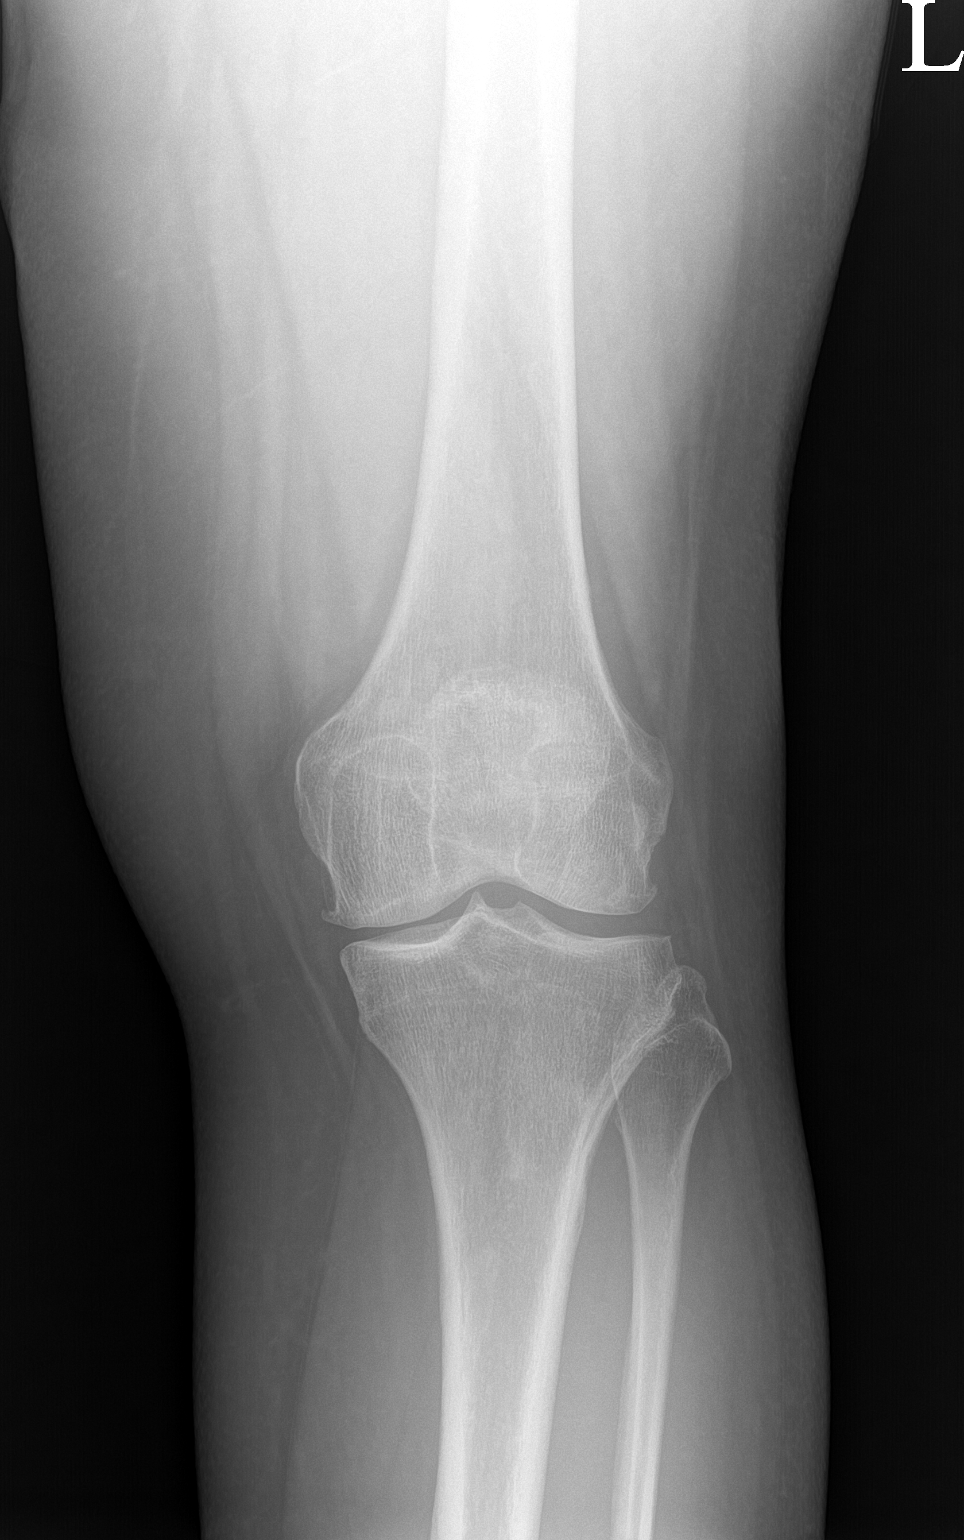

[knee lat]
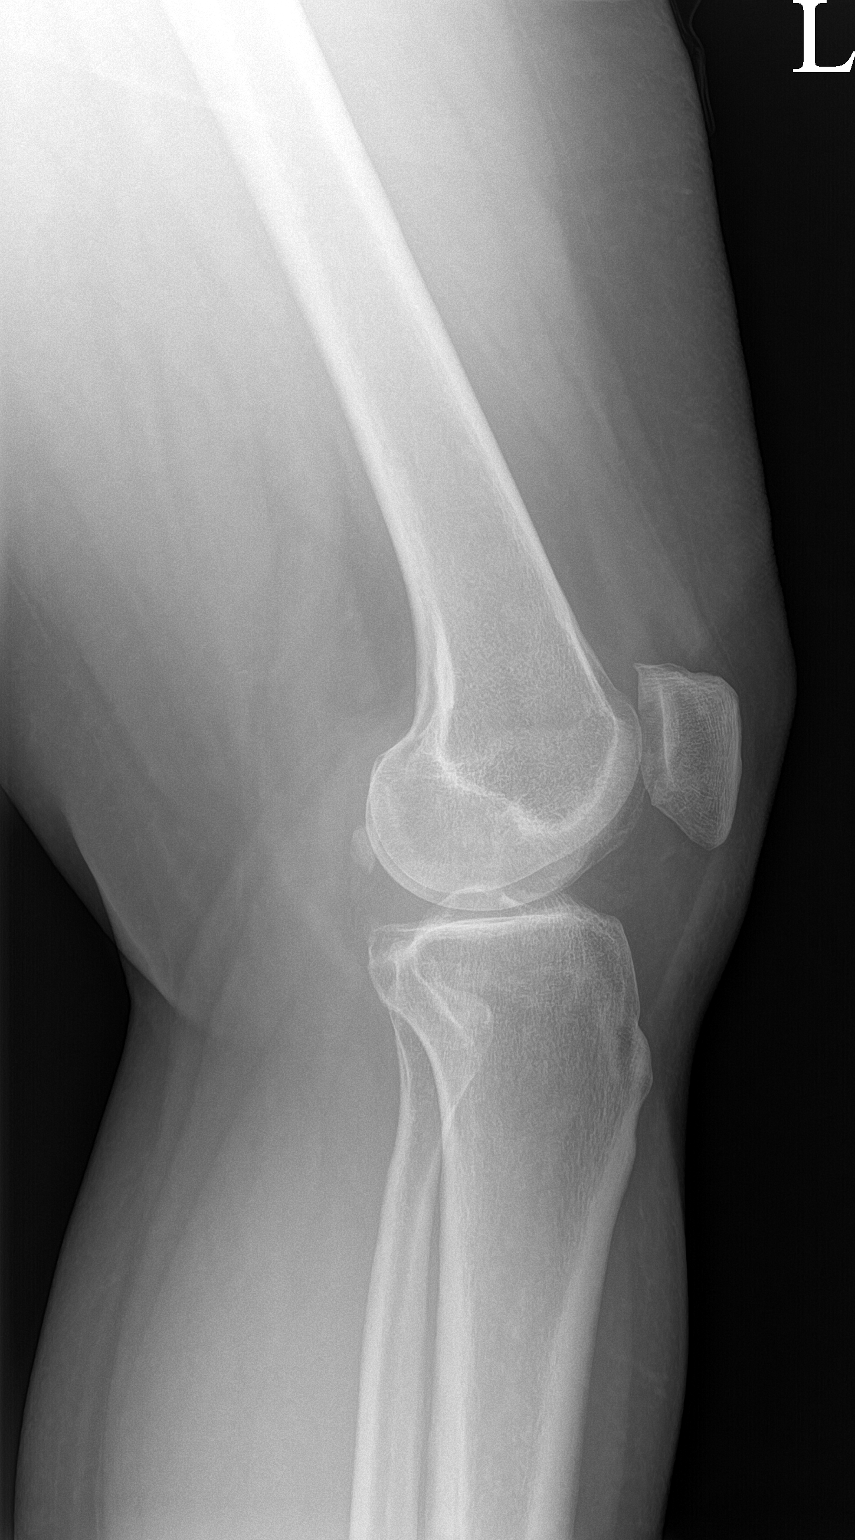

[patella]
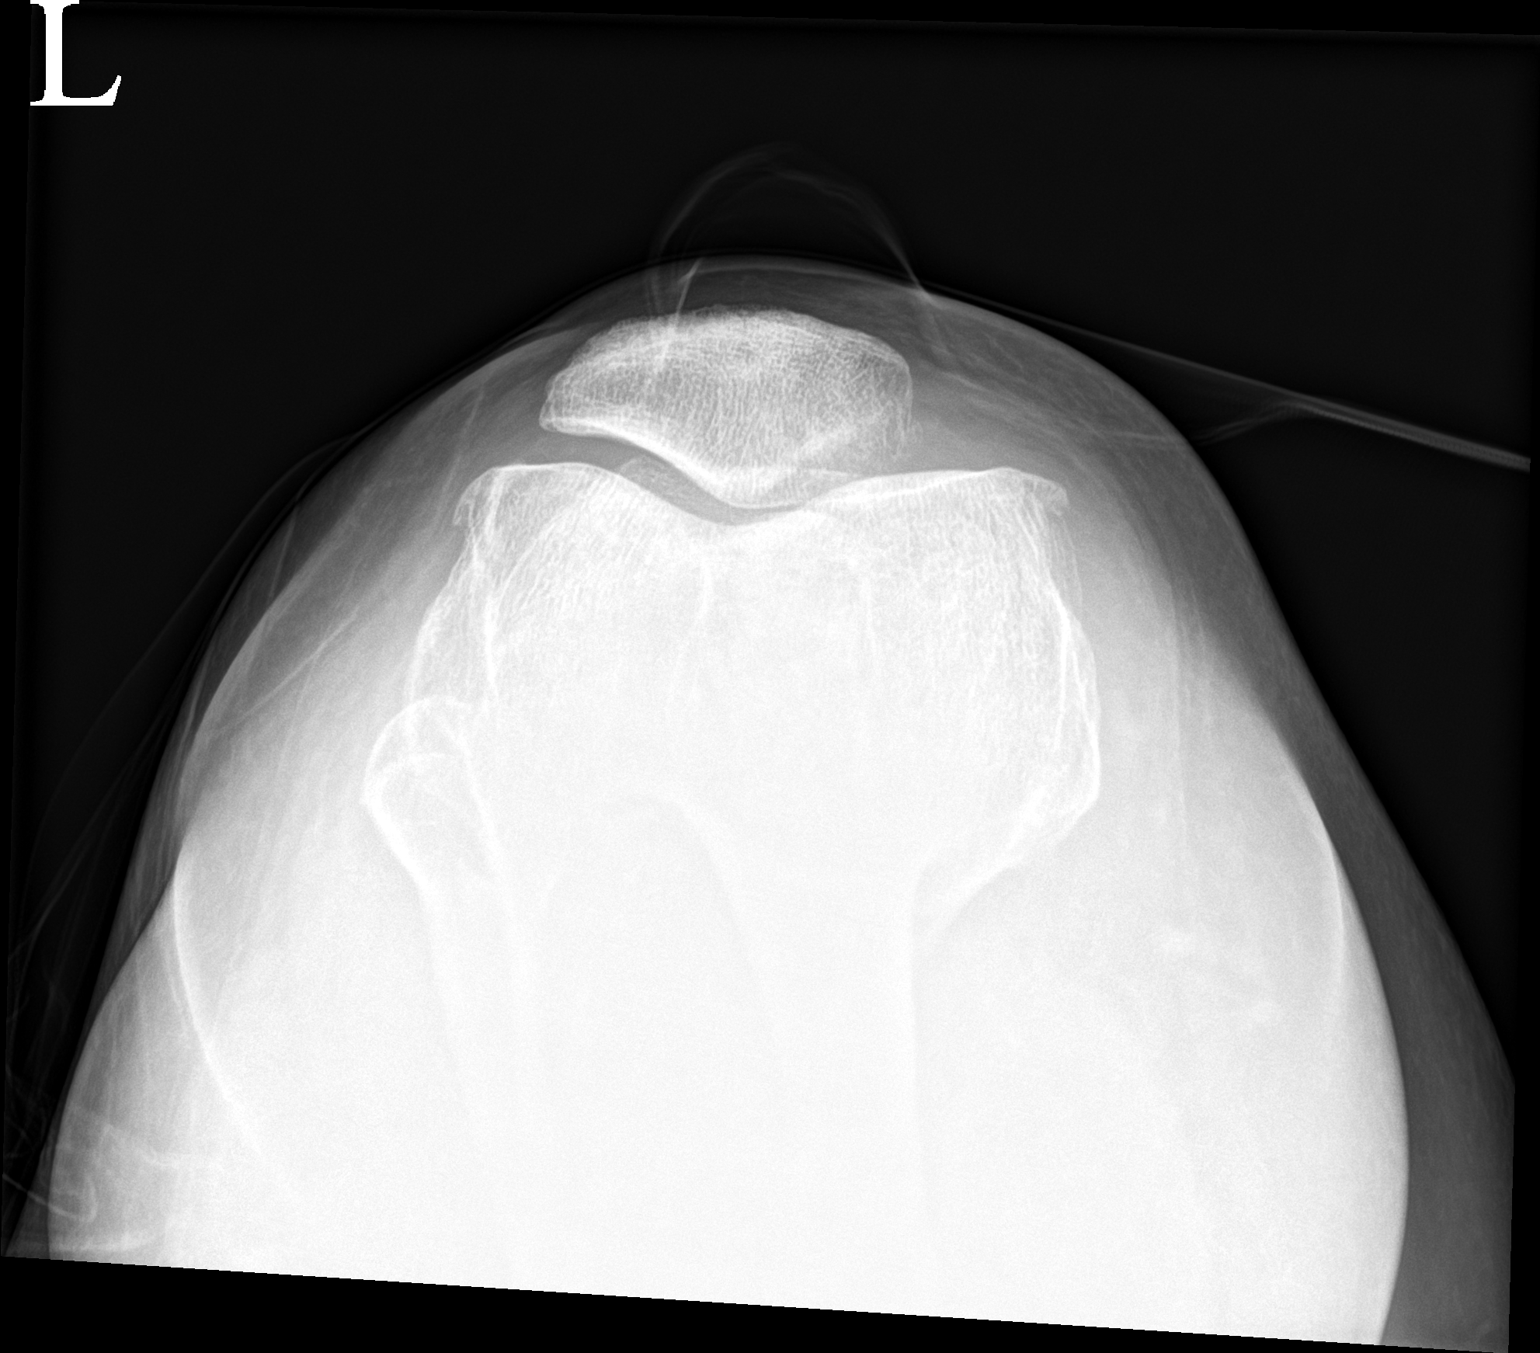

[3 of 3 positions shown; findings below may reference images not displayed]

FINDINGS: Normal alignment. There may be minimal medial tibiofemoral joint
space narrowing. Mild tricompartmental peripheral spurring.
Subchondral cysts in the medial patella. No fracture. No erosion,
periosteal reaction, or focal bone lesion. No significant knee joint
effusion. Unremarkable soft tissues.
IMPRESSION: Mild tricompartmental osteoarthritis.

## 2023-10-13 ENCOUNTER — Encounter: Payer: Self-pay | Admitting: Internal Medicine

## 2023-10-13 DIAGNOSIS — E1149 Type 2 diabetes mellitus with other diabetic neurological complication: Secondary | ICD-10-CM

## 2023-10-14 MED ORDER — SYRINGE/NEEDLE (DISP) 25G X 1" 3 ML MISC: 1 refills | Status: AC

## 2023-11-03 NOTE — Telephone Encounter (Signed)
 Scheduled 11/06/2023.

## 2023-11-03 NOTE — Progress Notes (Unsigned)
 =ubjective:     Patient ID: Lisa Duran, female   DOB: 04/14/1981, 43 y.o.   MRN: [id number]  HPI [name] is a pleasant 43 y.o.  woman returning for f/u for DM2, insulin dependent, uncontrolled, dx 05/2009, w/o complications. Last visit 4 months ago.  Interim history: No increased urination, blurry vision, nausea, chest pain. She lost approximately 70 pounds after starting  Ozempic.  She gained 13 pounds after having had a torn meniscus and knee surgery in 08/2022.  Since last visit, she also had problems with right hip pain due to tear of right acetabular labrum >> she has surgery 07/2023 and then she had knee surgery 08/2023. She is not able to walk consistently - finished PT now. Now gets gel knee injections.  She has fasciculations in the sole of the right foot. She gained 9 pounds since last visit. She got married since last visit.  Reviewed HbA1c levels: Lab Results  Component Value Date   HGBA1C 6.7 (H) 05/22/2023   HGBA1C 6.4 03/02/2023   HGBA1C 6.5 (A) 10/31/2022   HGBA1C 6.9 (A) 06/27/2022   HGBA1C 7.0 (A) 02/20/2022   HGBA1C 6.9 (A) 10/08/2021   HGBA1C 7.6 (A) 07/01/2021   HGBA1C 7.5 (A) 02/21/2021   HGBA1C 7.6 (A) 10/16/2020   HGBA1C 8.6 (A) 06/12/2020   She is on:   - Metformin 1000 mg 2x a day  - Ozempic 0.5 mg weekly - After starting Ozempic, she had constipation, gas, nausea, but these improved.  She continues on Pepcid and Mylanta prn. We stopped glipizide ER and also Januvia. We tried Cycloset 0.8 mg daily >> stopped end of 11/2016 b/c GERD/AP/C We tried Trulicity >> could not tolerate it: N/V/AP. She restarted Januvia 09/2016. She was on Invokana 100 mg in am (started 12/2014) >> 2 yeast inf >> tx with Diflucan; she was exhausted and had nocturia >> stopped 03/12/2015 Tried Victoza 2 mo ago >> AP, severe GERD, inj site rxn Tried Januvia >> tolerated it well, but stopped when Invokana was suggested.  She was also on Actos, but taken off b/c good control. She had  problems tolerating insulins in the past.  She was previously on Lantus and NovoLog.  Basaglar and [place] caused eczema and also GI symptoms.  We then switched to NPH (approximately 50 units a day) and regular insulin but she was able to come off due to good control after starting Ozempic:  She has a [employer].  She checks her sugars 1x a day: - am: 105-145, 157 - 2h after b'fast: n/c - lunch: 114, 134 - 2h after lunch: n/c - dinner: 126 - 2h after dinner: n/c - bedtime: n/c  Prev.  On the CGM, but not covered anymore due to not being on insulin.   Prev.:  Lowest: 97 Highest: 190 She has hypoglycemia awareness in the 80s.  No CKD: Lab Results  Component Value Date   BUN 10 07/16/2023   Lab Results  Component Value Date   CREATININE 0.71 07/16/2023   No MAU: Lab Results  Component Value Date   MICRALBCREAT 0.9 05/22/2023   MICRALBCREAT 0.6 05/20/2022   MICRALBCREAT 0.5 05/10/2021   MICRALBCREAT 28 05/09/2020   MICRALBCREAT 0.8 10/20/2018   + HL: Lab Results  Component Value Date   CHOL 160 05/22/2023   HDL 77.60 05/22/2023   LDLCALC 65 05/22/2023   TRIG 86.0 05/22/2023   CHOLHDL 2 05/22/2023  On Crestor 5 mg 2x a week.  -  Last eye exam 01/2023: reportedly No DR, IOP high - changed opthalmologists, - prev.[facility].  - Resolved numbness and tingling in hand and feet.  She felt much better on Cymbalta and Neurontin, but she is now off both -no return of symptoms.  Last foot exam 10/31/2022.  She had CP >> had a stress test 07/04/2015 >>  Normal.  She is on metoprolol for tachycardia. She has fibromyalgia.   She also has a history of iron deficiency anemia and had iron infusions.   In 2019, she just got out of a 5-year relationship.  She was under a lot of stress.   Review of Systems + see HPI  Past Medical History:  Diagnosis Date   ADHD (attention deficit hyperactivity disorder)    ALLERGIC RHINITIS     Allergy    Anemia    ANXIETY    Depression    DIABETES MELLITUS, TYPE II    Fibromyalgia 2018   GERD (gastroesophageal reflux disease)    MIGRAINE, COMMON    Obesity, unspecified    OVARIAN CYST    Past Surgical History:  Procedure Laterality Date   KNEE ARTHROSCOPY Left 08/25/2022   Social History   Socioeconomic History   Marital status: Married    Spouse name: Not on file   Number of children: Not on file   Years of education: Not on file   Highest education level: Bachelor's degree (e.g., BA, AB, BS)  Occupational History   Not on file  Tobacco Use   Smoking status: Former    Current packs/day: 0.00    Types: Cigarettes    Quit date: 08/20/2002    Years since quitting: 21.2   Smokeless tobacco: Never   Tobacco comments:    single, separated from spouse 02/2010.   Vaping Use   Vaping status: Never Used  Substance and Sexual Activity   Alcohol use: Yes    Comment: couple of drinks a week   Drug use: No   Sexual activity: Not Currently    Birth control/protection: I.U.D.  Other Topics Concern   Not on file  Social History Narrative   Single, separated from spouse 02/2010   Social Drivers of Health   Financial Resource Strain: Low Risk  (11/18/2022)   Overall Financial Resource Strain (CARDIA)    Difficulty of Paying Living Expenses: Not very hard  Food Insecurity: No Food Insecurity (11/18/2022)   Hunger Vital Sign    Worried About Running Out of Food in the Last Year: Never true    Ran Out of Food in the Last Year: Never true  Transportation Needs: No Transportation Needs (11/18/2022)   PRAPARE - Administrator, Civil Service (Medical): No    Lack of Transportation (Non-Medical): No  Physical Activity: Unknown (11/18/2022)   Exercise Vital Sign    Days of Exercise per Week: Patient declined    Minutes of Exercise per Session: Not on file  Stress: Stress Concern Present (11/18/2022)   [employer] of Occupational Health - Occupational Stress  Questionnaire    Feeling of Stress : To some extent  Social Connections: Socially Integrated (11/18/2022)   Social Connection and Isolation Panel [NHANES]    Frequency of Communication with Friends and Family: Twice a week    Frequency of Social Gatherings with Friends and Family: Once a week    Attends Religious Services: More than 4 times per year    Active Member of [employer] or Organizations: Yes  Attends Banker Meetings: More than 4 times per year    Marital Status: Living with partner  Intimate Partner Violence: Not on file   Current Outpatient Medications on File Prior to Visit  Medication Sig Dispense Refill   ALPRAZolam (XANAX) 0.5 MG tablet TAKE 1 TABLET(0.5 MG) BY MOUTH THREE TIMES DAILY AS NEEDED FOR ANXIETY 60 tablet 5   amphetamine-dextroamphetamine (ADDERALL) 20 MG tablet Take 1 tablet (20 mg total) by mouth 2 (two) times daily. 180 tablet 0   aspirin EC 325 MG tablet Take 1 tablet (325 mg total) by mouth daily. 14 tablet 0   Continuous Glucose Sensor (FREESTYLE LIBRE 3 SENSOR) MISC 1 each by Does not apply route every 14 (fourteen) days. 6 each 3   cyanocobalamin (VITAMIN B12) 1000 MCG/ML injection Inject 1 mL (1,000 mcg total) into the muscle every 30 (thirty) days. 10 mL 1   EPINEPHrine 0.3 mg/0.3 mL IJ SOAJ injection Use as needed for hypersensitivity reaction 1 each 0   Fexofenadine HCl (ALLEGRA ALLERGY PO) Take 1 tablet by mouth daily as needed (Allergies).     glucose blood (ONETOUCH VERIO) test strip Use 2x a day 200 each 3   ibuprofen (ADVIL) 800 MG tablet TAKE 1 TABLET( 800 MG TOTAL) BY MOUTH EVERY 8 HOURS FOR 10 DAYS. TAKE WITH FOOD, ALTERNATE WITH ACETAMINOPHEN 30 tablet 0   Insulin Syringe-Needle U-100 (B-D INS SYR ULTRAFINE .5CC/30G) 30G X 1/2" 0.5 ML MISC Use to inject insulin once daily. 100 each 11   levonorgestrel (MIRENA) 20 MCG/24HR IUD 1 Intra Uterine Device (1 each total) by Intrauterine route once. 1 each 0   metFORMIN (GLUCOPHAGE) 1000 MG  tablet TAKE 2 TABLETS BY MOUTH DAILY WITH SUPPER. 180 tablet 3   metoprolol succinate (TOPROL-XL) 25 MG 24 hr tablet TAKE 1 TABLET BY MOUTH DAILY 90 tablet 3   omeprazole (PRILOSEC) 40 MG capsule TAKE 1 CAPSULE BY MOUTH DAILY BEFORE SUPPER (Patient taking differently: Take 40 mg by mouth every other day.) 90 capsule 2   ondansetron (ZOFRAN ODT) 4 MG disintegrating tablet Take 1 tablet (4 mg total) by mouth every 8 (eight) hours as needed for nausea or vomiting. 20 tablet 0   oxyCODONE (ROXICODONE) 5 MG immediate release tablet Take 1 tablet (5 mg total) by mouth every 4 (four) hours as needed for severe pain (pain score 7-10) or breakthrough pain. 10 tablet 0   OZEMPIC, 0.25 OR 0.5 MG/DOSE, 2 MG/3ML SOPN INJECT 0.5MG  UNDER THE SKIN ONCE A WEEK 9 mL 3   promethazine (PHENERGAN) 25 MG tablet Take 1 tablet (25 mg total) by mouth every 6 (six) hours as needed for nausea. 15 tablet 0   rizatriptan (MAXALT) 10 MG tablet Take 1 tablet (10 mg total) by mouth as needed for migraine. May repeat in 2 hours if needed 10 tablet 8   rosuvastatin (CRESTOR) 5 MG tablet TAKE 1 TABLET(5 MG) BY MOUTH 2 TIMES A WEEK 13 tablet 3   SYRINGE-NEEDLE, DISP, 3 ML 25G X 1" 3 ML MISC Use for monthly B12 injections 12 each 1   triamcinolone cream (KENALOG) 0.1 % Apply 1 application topically 2 (two) times daily as needed (for itching). 28.4 g 1   No current facility-administered medications on file prior to visit.   Allergies  Allergen Reactions   Chicken Allergy Anaphylaxis and Shortness Of Breath   Covid-19 Mrna Vacc (Moderna) Anaphylaxis   Other Itching, Swelling and Other (See Comments)    NUTS (of ANY kind): Lips  and mouth swell, but no breathing impairment & migraines and eczema are triggered   Propofol Shortness Of Breath    SOB, chest tightness, wheezing after colonoscopy on 09-18-15  Received propofol for surgery 07/21/23 without complication   Basaglar Stephanie Coup [Insulin Glargine] Palpitations and Hypertension    Adhesive [Tape] Other (See Comments)    Makes the skin VERY RED    Gluten Meal Diarrhea   Peanut-Containing Drug Products Itching, Swelling and Other (See Comments)    Lips and mouth swell, but no breathing impairment & migraines and eczema are triggered    Novolog [Insulin Aspart (Human Analog)] Rash   Evaristo Bury [Insulin Degludec] Rash    eczema   Victoza [Liraglutide] Rash   Family History  Problem Relation Age of Onset   Diabetes Mother    Hyperlipidemia Mother    CAD Mother    CVA Mother    Diabetes Father    Hyperlipidemia Father    Coronary artery disease Father    Hypertension Father    Cancer Father        esoph   Stroke Father    Cancer Maternal Grandfather 60       Prostate and Lung   Diabetes Sister        Gestational   Graves' disease Maternal Aunt    Cancer Maternal Uncle        Brain   Stroke Paternal Grandmother    Heart disease Paternal Grandmother    Stroke Paternal Grandfather    Heart disease Paternal Grandfather    Diabetes Sister        Gestational and Type II   Hashimoto's thyroiditis Cousin    Cancer Maternal Uncle        Brain   Objective:   Physical Exam BP 120/64   Pulse (!) 104   Ht 5\' 4"  (1.626 m)   Wt 187 lb 12.8 oz (85.2 kg)   SpO2 99%   BMI 32.24 kg/m   Wt Readings from Last 15 Encounters:  11/04/23 187 lb 12.8 oz (85.2 kg)  07/21/23 178 lb (80.7 kg)  07/16/23 179 lb 3.2 oz (81.3 kg)  05/22/23 178 lb (80.7 kg)  05/21/23 179 lb (81.2 kg)  05/12/23 179 lb (81.2 kg)  03/04/23 176 lb (79.8 kg)  03/02/23 176 lb 12.8 oz (80.2 kg)  02/11/23 178 lb (80.7 kg)  01/29/23 178 lb (80.7 kg)  12/26/22 181 lb (82.1 kg)  11/20/22 184 lb (83.5 kg)  10/31/22 186 lb 6.4 oz (84.6 kg)  06/27/22 173 lb 6.4 oz (78.7 kg)  05/20/22 178 lb (80.7 kg)   Constitutional: overweight, in NAD Eyes: EOMI, no exophthalmos ENT: no thyromegaly, no cervical lymphadenopathy Cardiovascular: tachycardia, RR, No MRG Respiratory: CTA B Musculoskeletal: no  deformities Skin: no rashes Neurological: no tremor with outstretched hands Diabetic Foot Exam - Simple   Simple Foot Form Diabetic Foot exam was performed with the following findings: Yes 11/04/2023  9:15 AM  Visual Inspection No deformities, no ulcerations, no other skin breakdown bilaterally: Yes Sensation Testing Intact to touch and monofilament testing bilaterally: Yes Pulse Check Posterior Tibialis and Dorsalis pulse intact bilaterally: Yes Comments    Assessment:     1. DM2, insulin-dependent, uncontrolled, without complications - r/o autoimmunity Component     Latest Ref Rng 08/17/2012  Glutamic Acid Decarb Ab     <=1.0 U/mL <1.0  Pancreatic Islet Cell Antibody     < 5 JDF Units <5  C-Peptide     0.80 - 3.90  ng/mL 1.84  Glucose     70 - 99 mg/dL 161 (H)     Component     Latest Ref Rng 10/31/2022  Glucose     65 - 99 mg/dL 84   C-Peptide     0.96 - 3.85 ng/mL 1.47    2. Obesity class 1  3. HL  Plan:     1. Patient with longstanding, previously uncontrolled type 2 diabetes, on insulin in the past, but now off due to good control.  She is on a regimen of metformin and weekly GLP-1 receptor agonist.  At last visit, sugars were fluctuating within the target range with only occasional mild hyperglycemic spikes.  She was able to stop her insulin before last visit without an increase in blood sugars.  At last visit, due to coming off insulin, she was not sure whether her CGM would still be covered by her insurance.  I sent a new prescription for the freestyle libre 3 to [facility] but we discussed that if this is not covered, she will need to check sugars manually.  HbA1c at last visit was lower, at 6.4% (03/02/2023): But she had another HbA1c obtained 5 months ago and this was higher, at 6.7%. -At today's visit, unfortunately, she is off her CGM as this is not covered anymore.  She is checking sugars approximately once a day, rotating check times.  They are higher.  Due to her  orthopedic problems, she also gained 9 pounds since last visit.  We discussed about trying to exercise again consistently, and continuing to check sugars but for now I did not suggest a change in regimen.  I will have her back in 4 months and at that time we may need an increase in dose of Ozempic or adding back insulin if the sugars continue to increase. - I suggested to:  Patient Instructions  Please continue: - Metformin 1000 mg 2x a day with meals - Ozempic 0.5 mg weekly  Please return in 4 months.  - we checked her HbA1c: 6.8% (higher) - advised to check sugars at different times of the day - 4x a day, rotating check times - advised for yearly eye exams >> she is UTD - return to clinic in 4 months  2. Obesity class 1 -Ozempic was initially poorly tolerated but she then started to tolerate it better and now has no consistent side effects from it -She lost approximately 70 pounds after starting Ozempic but then gained weight due to hip and knee pain and not being able to exercise consistently -she gained 9 pounds since last visit.  I am hoping that she can start exercise soon as possible to avoid gaining more of the weight back.  3. HL -Latest lipid panel reviewed from 05/2023: All fractions at goal: Lab Results  Component Value Date   CHOL 160 05/22/2023   HDL 77.60 05/22/2023   LDLCALC 65 05/22/2023   TRIG 86.0 05/22/2023   CHOLHDL 2 05/22/2023  -She continues on Crestor 5 mg twice a week.  She has muscle and joint aches but she does not feel that these are related to the statin, but to her fibromyalgia  Carlus Pavlov, MD PhD [facility] Endocrinology

## 2023-11-04 ENCOUNTER — Ambulatory Visit (INDEPENDENT_AMBULATORY_CARE_PROVIDER_SITE_OTHER): Payer: 59 | Admitting: Internal Medicine

## 2023-11-04 ENCOUNTER — Encounter: Payer: Self-pay | Admitting: Internal Medicine

## 2023-11-04 VITALS — BP 120/64 | HR 104 | Ht 64.0 in | Wt 187.8 lb

## 2023-11-04 DIAGNOSIS — E1149 Type 2 diabetes mellitus with other diabetic neurological complication: Secondary | ICD-10-CM | POA: Diagnosis not present

## 2023-11-04 DIAGNOSIS — E785 Hyperlipidemia, unspecified: Secondary | ICD-10-CM

## 2023-11-04 DIAGNOSIS — E663 Overweight: Secondary | ICD-10-CM | POA: Diagnosis not present

## 2023-11-04 DIAGNOSIS — Z7985 Long-term (current) use of injectable non-insulin antidiabetic drugs: Secondary | ICD-10-CM | POA: Diagnosis not present

## 2023-11-04 DIAGNOSIS — Z7984 Long term (current) use of oral hypoglycemic drugs: Secondary | ICD-10-CM

## 2023-11-04 LAB — POCT GLYCOSYLATED HEMOGLOBIN (HGB A1C): Hemoglobin A1C: 6.8 % — AB (ref 4.0–5.6)

## 2023-11-04 MED ORDER — METFORMIN HCL 1000 MG PO TABS: ORAL_TABLET | ORAL | 3 refills | Status: AC

## 2023-11-04 NOTE — Patient Instructions (Signed)
 Please continue: - Metformin 1000 mg 2x a day with meals - Ozempic 0.5 mg weekly  Please return in 4 months.

## 2023-11-05 NOTE — Progress Notes (Unsigned)
 [facility] [address] [place] [address] Phone: [phone]   Assessment and Plan:     There are no diagnoses linked to this encounter.  ***   Pertinent previous records reviewed include ***    Follow Up: ***     Subjective:    Chief Complaint: Left knee pain    HPI:    05/21/23 Patient states left knee pain and continued right hip pain    09/14/23 Today patient states Left knee is in a lot of pain that started 2 weeks ago. Been going in PT for her hip labrel tear repair surgery and the PT thought it was a good idea to be seen. Patient states it feels like it did when she tore her meniscus. The right knee is starting to hurt as well. Pain is sharp and shooting. Has to use crutches sometimes. Also doing RICE  11/06/2023 Patient states   Relevant Historical Information: DM type ll  Additional pertinent review of systems negative.   Current Outpatient Medications:    ALPRAZolam (XANAX) 0.5 MG tablet, TAKE 1 TABLET(0.5 MG) BY MOUTH THREE TIMES DAILY AS NEEDED FOR ANXIETY, Disp: 60 tablet, Rfl: 5   amphetamine-dextroamphetamine (ADDERALL) 20 MG tablet, Take 1 tablet (20 mg total) by mouth 2 (two) times daily., Disp: 180 tablet, Rfl: 0   Continuous Glucose Sensor (FREESTYLE LIBRE 3 SENSOR) MISC, 1 each by Does not apply route every 14 (fourteen) days., Disp: 6 each, Rfl: 3   cyanocobalamin (VITAMIN B12) 1000 MCG/ML injection, Inject 1 mL (1,000 mcg total) into the muscle every 30 (thirty) days., Disp: 10 mL, Rfl: 1   EPINEPHrine 0.3 mg/0.3 mL IJ SOAJ injection, Use as needed for hypersensitivity reaction, Disp: 1 each, Rfl: 0   Fexofenadine HCl (ALLEGRA ALLERGY PO), Take 1 tablet by mouth daily as needed (Allergies)., Disp: , Rfl:    glucose blood (ONETOUCH VERIO) test strip, Use 2x a day, Disp: 200 each, Rfl: 3   ibuprofen (ADVIL) 800 MG tablet, TAKE 1 TABLET( 800 MG TOTAL) BY MOUTH EVERY 8 HOURS FOR 10 DAYS. TAKE  WITH FOOD, ALTERNATE WITH ACETAMINOPHEN, Disp: 30 tablet, Rfl: 0   levonorgestrel (MIRENA) 20 MCG/24HR IUD, 1 Intra Uterine Device (1 each total) by Intrauterine route once., Disp: 1 each, Rfl: 0   metFORMIN (GLUCOPHAGE) 1000 MG tablet, TAKE 2 TABLETS BY MOUTH DAILY WITH SUPPER., Disp: 180 tablet, Rfl: 3   metoprolol succinate (TOPROL-XL) 25 MG 24 hr tablet, TAKE 1 TABLET BY MOUTH DAILY, Disp: 90 tablet, Rfl: 3   omeprazole (PRILOSEC) 40 MG capsule, TAKE 1 CAPSULE BY MOUTH DAILY BEFORE SUPPER (Patient taking differently: Take 40 mg by mouth every other day.), Disp: 90 capsule, Rfl: 2   ondansetron (ZOFRAN ODT) 4 MG disintegrating tablet, Take 1 tablet (4 mg total) by mouth every 8 (eight) hours as needed for nausea or vomiting., Disp: 20 tablet, Rfl: 0   OZEMPIC, 0.25 OR 0.5 MG/DOSE, 2 MG/3ML SOPN, INJECT 0.5MG  UNDER THE SKIN ONCE A WEEK, Disp: 9 mL, Rfl: 3   promethazine (PHENERGAN) 25 MG tablet, Take 1 tablet (25 mg total) by mouth every 6 (six) hours as needed for nausea., Disp: 15 tablet, Rfl: 0   rizatriptan (MAXALT) 10 MG tablet, Take 1 tablet (10 mg total) by mouth as needed for migraine. May repeat in 2 hours if needed, Disp: 10 tablet, Rfl: 8   rosuvastatin (CRESTOR) 5 MG tablet, TAKE 1 TABLET(5 MG) BY MOUTH 2 TIMES A  WEEK, Disp: 13 tablet, Rfl: 3   SYRINGE-NEEDLE, DISP, 3 ML 25G X 1" 3 ML MISC, Use for monthly B12 injections, Disp: 12 each, Rfl: 1   triamcinolone cream (KENALOG) 0.1 %, Apply 1 application topically 2 (two) times daily as needed (for itching)., Disp: 28.4 g, Rfl: 1   Objective:     There were no vitals filed for this visit.    There is no height or weight on file to calculate BMI.    Physical Exam:    ***   Electronically signed by:  L[facility] 7:30 AM 11/05/23

## 2023-11-06 ENCOUNTER — Ambulatory Visit: Admitting: Sports Medicine

## 2023-11-06 VITALS — BP 124/82 | HR 84 | Ht 64.0 in | Wt 187.0 lb

## 2023-11-06 DIAGNOSIS — G8929 Other chronic pain: Secondary | ICD-10-CM

## 2023-11-06 DIAGNOSIS — M7741 Metatarsalgia, right foot: Secondary | ICD-10-CM | POA: Diagnosis not present

## 2023-11-06 DIAGNOSIS — M25562 Pain in left knee: Secondary | ICD-10-CM | POA: Diagnosis not present

## 2023-11-06 DIAGNOSIS — M1712 Unilateral primary osteoarthritis, left knee: Secondary | ICD-10-CM | POA: Diagnosis not present

## 2023-11-06 DIAGNOSIS — M79671 Pain in right foot: Secondary | ICD-10-CM

## 2023-11-06 MED ORDER — SODIUM HYALURONATE 60 MG/3ML IX PRSY
60.0000 mg | PREFILLED_SYRINGE | Freq: Once | INTRA_ARTICULAR | Status: AC
  Administered 2023-11-06: 60 mg via INTRA_ARTICULAR

## 2023-11-06 NOTE — Patient Instructions (Signed)
 3-4 week follow up  Voltaren gel over areas of pain  Recommend wearing comfortable tennis shoes

## 2023-11-16 NOTE — Progress Notes (Unsigned)
 Aleen Sells [name] [address] [place] [address] Phone: [phone]   Assessment and Plan:     There are no diagnoses linked to this encounter.  ***   Pertinent previous records reviewed include ***    Follow Up: ***     Subjective:   I, Jori Thrall, am serving as a Neurosurgeon for Doctor [employer]  Chief Complaint: Left knee pain    HPI:    05/21/23 Patient states left knee pain and continued right hip pain    09/14/23 Today patient states Left knee is in a lot of pain that started 2 weeks ago. Been going in PT for her hip labrel tear repair surgery and the PT thought it was a good idea to be seen. Patient states it feels like it did when she tore her meniscus. The right knee is starting to hurt as well. Pain is sharp and shooting. Has to use crutches sometimes. Also doing RICE   11/06/2023 Patient states ready for gel injection. Would like to discuss right foot was told to ask from endoc  11/17/2023 Patient states   Relevant Historical Information: DM type ll  Additional pertinent review of systems negative.   Current Outpatient Medications:    ALPRAZolam (XANAX) 0.5 MG tablet, TAKE 1 TABLET(0.5 MG) BY MOUTH THREE TIMES DAILY AS NEEDED FOR ANXIETY, Disp: 60 tablet, Rfl: 5   amphetamine-dextroamphetamine (ADDERALL) 20 MG tablet, Take 1 tablet (20 mg total) by mouth 2 (two) times daily., Disp: 180 tablet, Rfl: 0   Continuous Glucose Sensor (FREESTYLE LIBRE 3 SENSOR) MISC, 1 each by Does not apply route every 14 (fourteen) days., Disp: 6 each, Rfl: 3   cyanocobalamin (VITAMIN B12) 1000 MCG/ML injection, Inject 1 mL (1,000 mcg total) into the muscle every 30 (thirty) days., Disp: 10 mL, Rfl: 1   EPINEPHrine 0.3 mg/0.3 mL IJ SOAJ injection, Use as needed for hypersensitivity reaction, Disp: 1 each, Rfl: 0   Fexofenadine HCl (ALLEGRA ALLERGY PO), Take 1 tablet by mouth daily as needed (Allergies)., Disp: , Rfl:     glucose blood (ONETOUCH VERIO) test strip, Use 2x a day, Disp: 200 each, Rfl: 3   ibuprofen (ADVIL) 800 MG tablet, TAKE 1 TABLET( 800 MG TOTAL) BY MOUTH EVERY 8 HOURS FOR 10 DAYS. TAKE WITH FOOD, ALTERNATE WITH ACETAMINOPHEN, Disp: 30 tablet, Rfl: 0   levonorgestrel (MIRENA) 20 MCG/24HR IUD, 1 Intra Uterine Device (1 each total) by Intrauterine route once., Disp: 1 each, Rfl: 0   metFORMIN (GLUCOPHAGE) 1000 MG tablet, TAKE 2 TABLETS BY MOUTH DAILY WITH SUPPER., Disp: 180 tablet, Rfl: 3   metoprolol succinate (TOPROL-XL) 25 MG 24 hr tablet, TAKE 1 TABLET BY MOUTH DAILY, Disp: 90 tablet, Rfl: 3   omeprazole (PRILOSEC) 40 MG capsule, TAKE 1 CAPSULE BY MOUTH DAILY BEFORE SUPPER (Patient taking differently: Take 40 mg by mouth every other day.), Disp: 90 capsule, Rfl: 2   ondansetron (ZOFRAN ODT) 4 MG disintegrating tablet, Take 1 tablet (4 mg total) by mouth every 8 (eight) hours as needed for nausea or vomiting., Disp: 20 tablet, Rfl: 0   OZEMPIC, 0.25 OR 0.5 MG/DOSE, 2 MG/3ML SOPN, INJECT 0.5MG  UNDER THE SKIN ONCE A WEEK, Disp: 9 mL, Rfl: 3   promethazine (PHENERGAN) 25 MG tablet, Take 1 tablet (25 mg total) by mouth every 6 (six) hours as needed for nausea., Disp: 15 tablet, Rfl: 0   rizatriptan (MAXALT) 10 MG tablet, Take 1 tablet (10 mg total) by  mouth as needed for migraine. May repeat in 2 hours if needed, Disp: 10 tablet, Rfl: 8   rosuvastatin (CRESTOR) 5 MG tablet, TAKE 1 TABLET(5 MG) BY MOUTH 2 TIMES A WEEK, Disp: 13 tablet, Rfl: 3   SYRINGE-NEEDLE, DISP, 3 ML 25G X 1" 3 ML MISC, Use for monthly B12 injections, Disp: 12 each, Rfl: 1   triamcinolone cream (KENALOG) 0.1 %, Apply 1 application topically 2 (two) times daily as needed (for itching)., Disp: 28.4 g, Rfl: 1   Objective:     There were no vitals filed for this visit.    There is no height or weight on file to calculate BMI.    Physical Exam:    ***   Electronically signed by:  Aleen Sells [name] 1:51 PM 11/16/23

## 2023-11-17 ENCOUNTER — Ambulatory Visit (INDEPENDENT_AMBULATORY_CARE_PROVIDER_SITE_OTHER): Admitting: Sports Medicine

## 2023-11-17 VITALS — BP 130/84 | HR 115 | Ht 64.0 in | Wt 187.0 lb

## 2023-11-17 DIAGNOSIS — M25562 Pain in left knee: Secondary | ICD-10-CM

## 2023-11-17 DIAGNOSIS — G8929 Other chronic pain: Secondary | ICD-10-CM

## 2023-11-17 DIAGNOSIS — M1712 Unilateral primary osteoarthritis, left knee: Secondary | ICD-10-CM

## 2023-11-17 MED ORDER — MELOXICAM 15 MG PO TABS: 15.0000 mg | ORAL_TABLET | Freq: Every day | ORAL | 0 refills | Status: DC

## 2023-11-17 NOTE — Patient Instructions (Signed)
-   Start meloxicam 15 mg daily x2 weeks.  If still having pain after 2 weeks, complete 3rd-week of NSAID. May use remaining NSAID as needed once daily for pain control.  Do not to use additional over-the-counter NSAIDs (ibuprofen, naproxen, Advil, Aleve, etc.) while taking prescription NSAIDs.  May use Tylenol [phone] mg 2 to 3 times a day for breakthrough pain. Zilretta approval  We will call you when approved

## 2023-11-19 NOTE — Progress Notes (Unsigned)
 Subjective:    Patient ID: Lisa Duran, female    DOB: 1980-10-23, 43 y.o.   MRN: [id number]     HPI Akemi is here for follow up of her chronic medical problems.  Has not been moving as much due to left knee OA.  Has not been eating as well and exercising less - has gained weight.   Medications and allergies reviewed with patient and updated if appropriate.  Current Outpatient Medications on File Prior to Visit  Medication Sig Dispense Refill   ALPRAZolam (XANAX) 0.5 MG tablet TAKE 1 TABLET(0.5 MG) BY MOUTH THREE TIMES DAILY AS NEEDED FOR ANXIETY 60 tablet 5   amphetamine-dextroamphetamine (ADDERALL) 20 MG tablet Take 1 tablet (20 mg total) by mouth 2 (two) times daily. 180 tablet 0   Continuous Glucose Sensor (FREESTYLE LIBRE 3 SENSOR) MISC 1 each by Does not apply route every 14 (fourteen) days. 6 each 3   cyanocobalamin (VITAMIN B12) 1000 MCG/ML injection Inject 1 mL (1,000 mcg total) into the muscle every 30 (thirty) days. 10 mL 1   EPINEPHrine 0.3 mg/0.3 mL IJ SOAJ injection Use as needed for hypersensitivity reaction 1 each 0   Fexofenadine HCl (ALLEGRA ALLERGY PO) Take 1 tablet by mouth daily as needed (Allergies).     glucose blood (ONETOUCH VERIO) test strip Use 2x a day 200 each 3   ibuprofen (ADVIL) 800 MG tablet TAKE 1 TABLET( 800 MG TOTAL) BY MOUTH EVERY 8 HOURS FOR 10 DAYS. TAKE WITH FOOD, ALTERNATE WITH ACETAMINOPHEN 30 tablet 0   levonorgestrel (MIRENA) 20 MCG/24HR IUD 1 Intra Uterine Device (1 each total) by Intrauterine route once. 1 each 0   meloxicam (MOBIC) 15 MG tablet Take 1 tablet (15 mg total) by mouth daily. 30 tablet 0   metFORMIN (GLUCOPHAGE) 1000 MG tablet TAKE 2 TABLETS BY MOUTH DAILY WITH SUPPER. 180 tablet 3   metoprolol succinate (TOPROL-XL) 25 MG 24 hr tablet TAKE 1 TABLET BY MOUTH DAILY 90 tablet 3   omeprazole (PRILOSEC) 40 MG capsule TAKE 1 CAPSULE BY MOUTH DAILY BEFORE SUPPER (Patient taking differently: Take 40 mg by mouth every other day.)  90 capsule 2   ondansetron (ZOFRAN ODT) 4 MG disintegrating tablet Take 1 tablet (4 mg total) by mouth every 8 (eight) hours as needed for nausea or vomiting. 20 tablet 0   OZEMPIC, 0.25 OR 0.5 MG/DOSE, 2 MG/3ML SOPN INJECT 0.5MG  UNDER THE SKIN ONCE A WEEK 9 mL 3   promethazine (PHENERGAN) 25 MG tablet Take 1 tablet (25 mg total) by mouth every 6 (six) hours as needed for nausea. 15 tablet 0   rizatriptan (MAXALT) 10 MG tablet Take 1 tablet (10 mg total) by mouth as needed for migraine. May repeat in 2 hours if needed 10 tablet 8   rosuvastatin (CRESTOR) 5 MG tablet TAKE 1 TABLET(5 MG) BY MOUTH 2 TIMES A WEEK 13 tablet 3   SYRINGE-NEEDLE, DISP, 3 ML 25G X 1" 3 ML MISC Use for monthly B12 injections 12 each 1   triamcinolone cream (KENALOG) 0.1 % Apply 1 application topically 2 (two) times daily as needed (for itching). 28.4 g 1   No current facility-administered medications on file prior to visit.     Review of Systems  Constitutional:  Negative for fever.  HENT:  Positive for trouble swallowing (sometmies).   Respiratory:  Positive for wheezing (occ - allergy realted). Negative for cough and shortness of breath.   Cardiovascular:  Positive for chest pain (intermittent -  fibro or anxiety) and leg swelling (minimal). Negative for palpitations.  Gastrointestinal:  Negative for abdominal pain.  Neurological:  Positive for dizziness (occ - sometimes if she stands up or if she is just standing there) and headaches (tension HAs and occ migraines). Negative for light-headedness.       Objective:   Vitals:   11/20/23 0842  BP: 128/72  Pulse: 95  Temp: 98.6 F (37 C)  SpO2: 97%   BP Readings from Last 3 Encounters:  11/20/23 128/72  11/17/23 130/84  11/06/23 124/82   Wt Readings from Last 3 Encounters:  11/20/23 186 lb (84.4 kg)  11/17/23 187 lb (84.8 kg)  11/06/23 187 lb (84.8 kg)   Body mass index is 31.93 kg/m.    Physical Exam Constitutional:      General: She is not in  acute distress.    Appearance: Normal appearance.  HENT:     Head: Normocephalic and atraumatic.  Eyes:     Conjunctiva/sclera: Conjunctivae normal.  Cardiovascular:     Rate and Rhythm: Normal rate and regular rhythm.     Heart sounds: Normal heart sounds.  Pulmonary:     Effort: Pulmonary effort is normal. No respiratory distress.     Breath sounds: Normal breath sounds. No wheezing.  Musculoskeletal:     Cervical back: Neck supple.     Right lower leg: No edema.     Left lower leg: No edema.  Lymphadenopathy:     Cervical: No cervical adenopathy.  Skin:    General: Skin is warm and dry.     Findings: No rash.  Neurological:     Mental Status: She is alert. Mental status is at baseline.  Psychiatric:        Mood and Affect: Mood normal.        Behavior: Behavior normal.        Lab Results  Component Value Date   WBC 7.3 07/16/2023   HGB 13.3 07/16/2023   HCT 43.2 07/16/2023   PLT 337 07/16/2023   GLUCOSE 120 (H) 07/16/2023   CHOL 160 05/22/2023   TRIG 86.0 05/22/2023   HDL 77.60 05/22/2023   LDLCALC 65 05/22/2023   ALT 12 07/16/2023   AST 16 07/16/2023   NA 137 07/16/2023   K 4.1 07/16/2023   CL 101 07/16/2023   CREATININE 0.71 07/16/2023   BUN 10 07/16/2023   CO2 25 07/16/2023   TSH 0.64 05/22/2023   INR 1.1 01/11/2019   HGBA1C 6.8 (A) 11/04/2023   MICROALBUR 1.0 05/22/2023     Assessment & Plan:    Prevnar 20 today  See Problem List for Assessment and Plan of chronic medical problems.

## 2023-11-19 NOTE — Patient Instructions (Addendum)
      Blood work was ordered.       Medications changes include :   start vitamin D3 2000 units daily     Return in about 6 months (around 05/21/2024) for Physical Exam.

## 2023-11-20 ENCOUNTER — Ambulatory Visit: Payer: 59 | Admitting: Internal Medicine

## 2023-11-20 ENCOUNTER — Encounter: Payer: Self-pay | Admitting: Internal Medicine

## 2023-11-20 ENCOUNTER — Telehealth: Payer: Self-pay

## 2023-11-20 VITALS — BP 128/72 | HR 95 | Temp 98.6°F | Ht 64.0 in | Wt 186.0 lb

## 2023-11-20 DIAGNOSIS — G43009 Migraine without aura, not intractable, without status migrainosus: Secondary | ICD-10-CM

## 2023-11-20 DIAGNOSIS — Z23 Encounter for immunization: Secondary | ICD-10-CM | POA: Diagnosis not present

## 2023-11-20 DIAGNOSIS — F3289 Other specified depressive episodes: Secondary | ICD-10-CM

## 2023-11-20 DIAGNOSIS — E785 Hyperlipidemia, unspecified: Secondary | ICD-10-CM | POA: Diagnosis not present

## 2023-11-20 DIAGNOSIS — F419 Anxiety disorder, unspecified: Secondary | ICD-10-CM | POA: Diagnosis not present

## 2023-11-20 DIAGNOSIS — E538 Deficiency of other specified B group vitamins: Secondary | ICD-10-CM | POA: Diagnosis not present

## 2023-11-20 DIAGNOSIS — Z1211 Encounter for screening for malignant neoplasm of colon: Secondary | ICD-10-CM

## 2023-11-20 DIAGNOSIS — D508 Other iron deficiency anemias: Secondary | ICD-10-CM

## 2023-11-20 DIAGNOSIS — R000 Tachycardia, unspecified: Secondary | ICD-10-CM

## 2023-11-20 DIAGNOSIS — M1712 Unilateral primary osteoarthritis, left knee: Secondary | ICD-10-CM | POA: Insufficient documentation

## 2023-11-20 DIAGNOSIS — M797 Fibromyalgia: Secondary | ICD-10-CM

## 2023-11-20 DIAGNOSIS — K219 Gastro-esophageal reflux disease without esophagitis: Secondary | ICD-10-CM

## 2023-11-20 DIAGNOSIS — F900 Attention-deficit hyperactivity disorder, predominantly inattentive type: Secondary | ICD-10-CM

## 2023-11-20 LAB — IBC PANEL
Iron: 38 ug/dL — ABNORMAL LOW (ref 42–145)
Saturation Ratios: 8.1 % — ABNORMAL LOW (ref 20.0–50.0)
TIBC: 469 ug/dL — ABNORMAL HIGH (ref 250.0–450.0)
Transferrin: 335 mg/dL (ref 212.0–360.0)

## 2023-11-20 LAB — COMPREHENSIVE METABOLIC PANEL WITH GFR
ALT: 10 U/L (ref 0–35)
AST: 15 U/L (ref 0–37)
Albumin: 4.9 g/dL (ref 3.5–5.2)
Alkaline Phosphatase: 101 U/L (ref 39–117)
BUN: 12 mg/dL (ref 6–23)
CO2: 30 meq/L (ref 19–32)
Calcium: 10.7 mg/dL — ABNORMAL HIGH (ref 8.4–10.5)
Chloride: 98 meq/L (ref 96–112)
Creatinine, Ser: 0.78 mg/dL (ref 0.40–1.20)
GFR: 91.92 mL/min (ref >60.00)
Glucose, Bld: 137 mg/dL — ABNORMAL HIGH (ref 70–99)
Potassium: 4.5 meq/L (ref 3.5–5.1)
Sodium: 136 meq/L (ref 135–145)
Total Bilirubin: 0.3 mg/dL (ref 0.2–1.2)
Total Protein: 7.9 g/dL (ref 6.0–8.3)

## 2023-11-20 LAB — CBC WITH DIFFERENTIAL/PLATELET
Basophils Absolute: 0.1 10*3/uL (ref 0.0–0.1)
Basophils Relative: 0.7 % (ref 0.0–3.0)
Eosinophils Absolute: 0.2 10*3/uL (ref 0.0–0.7)
Eosinophils Relative: 1.9 % (ref 0.0–5.0)
HCT: 43.8 % (ref 36.0–46.0)
Hemoglobin: 14.3 g/dL (ref 12.0–15.0)
Lymphocytes Relative: 20.9 % (ref 12.0–46.0)
Lymphs Abs: 1.9 10*3/uL (ref 0.7–4.0)
MCHC: 32.7 g/dL (ref 30.0–36.0)
MCV: 83.3 fl (ref 78.0–100.0)
Monocytes Absolute: 0.8 10*3/uL (ref 0.1–1.0)
Monocytes Relative: 8.5 % (ref 3.0–12.0)
Neutro Abs: 6.1 10*3/uL (ref 1.4–7.7)
Neutrophils Relative %: 68 % (ref 43.0–77.0)
Platelets: 483 10*3/uL — ABNORMAL HIGH (ref 150.0–400.0)
RBC: 5.26 Mil/uL — ABNORMAL HIGH (ref 3.87–5.11)
RDW: 15.5 % (ref 11.5–15.5)
WBC: 8.9 10*3/uL (ref 4.0–10.5)

## 2023-11-20 LAB — LIPID PANEL
Cholesterol: 171 mg/dL (ref 0–200)
HDL: 77.7 mg/dL (ref >39.00)
LDL Cholesterol: 74 mg/dL (ref 0–99)
NonHDL: 93.79
Total CHOL/HDL Ratio: 2
Triglycerides: 97 mg/dL (ref 0.0–149.0)
VLDL: 19.4 mg/dL (ref 0.0–40.0)

## 2023-11-20 LAB — FERRITIN: Ferritin: 10.5 ng/mL (ref 10.0–291.0)

## 2023-11-20 LAB — VITAMIN B12: Vitamin B-12: 454 pg/mL (ref 211–911)

## 2023-11-20 NOTE — Assessment & Plan Note (Signed)
 Chronic Moderate- severe Following with sports medicine Getting injections- steroid, gel

## 2023-11-20 NOTE — Assessment & Plan Note (Signed)
Chronic Controlled, Stable Continue Adderall 20 mg bid

## 2023-11-20 NOTE — Assessment & Plan Note (Addendum)
 Chronic Only had a couple of little flares Controlled Stressed regular exercise, stress reduction

## 2023-11-20 NOTE — Assessment & Plan Note (Signed)
Chronic Controlled, Stable Continue metoprolol XL 25 mg daily

## 2023-11-20 NOTE — Assessment & Plan Note (Signed)
 Chronic Controlled, Stable Not currently on medication-was on duloxetine at 1 point-overall doing well without medication-continue to monitor

## 2023-11-20 NOTE — Assessment & Plan Note (Signed)
Chronic Doing B12 injections monthly at home Check B12 level 

## 2023-11-20 NOTE — Assessment & Plan Note (Addendum)
 Chronic Fluctuates  Controlled, Stable Continue Xanax 0.5 mg 3 times daily as needed

## 2023-11-20 NOTE — Telephone Encounter (Signed)
**  patient scheduled 12/04/23**  Zilretta authorized for Left knee No PA, Medical notes, or referrals needed Patient is responsible for 20% of J3304 (Zilretta) and [address] Remaining covered at 80% by the payer at the contracted rate Deductible $3300 and has been met OOP max $4950 has met [phone] Once OOP has been met coverage goes to 100%  Case ID [id number] EXP: 05/20/24

## 2023-11-20 NOTE — Assessment & Plan Note (Signed)
Chronic Does not tolerate oral iron-causes constipation Check iron panel, CBC

## 2023-11-20 NOTE — Assessment & Plan Note (Addendum)
 Chronic GERD increased - ? Related to weight gain Continue omeprazole 40 mg - go back to daily

## 2023-11-20 NOTE — Assessment & Plan Note (Signed)
Chronic Regular exercise and healthy diet encouraged Check lipid panel  Continue Crestor 5 mg 2 days a week

## 2023-11-20 NOTE — Assessment & Plan Note (Signed)
 Chronic Controlled Continue Maxalt 10 mg daily as needed or over-the-counter Excedrin Migraine Also on metoprolol which may help with prevention

## 2023-11-21 ENCOUNTER — Encounter: Payer: Self-pay | Admitting: Internal Medicine

## 2023-11-23 NOTE — Telephone Encounter (Signed)
 Noted.

## 2023-11-25 ENCOUNTER — Encounter: Payer: Self-pay | Admitting: Internal Medicine

## 2023-11-25 ENCOUNTER — Other Ambulatory Visit: Payer: Self-pay | Admitting: Internal Medicine

## 2023-11-25 ENCOUNTER — Other Ambulatory Visit: Payer: Self-pay

## 2023-11-25 MED ORDER — OMEPRAZOLE 40 MG PO CPDR: DELAYED_RELEASE_CAPSULE | ORAL | 2 refills | Status: AC

## 2023-11-30 ENCOUNTER — Encounter: Payer: Self-pay | Admitting: Internal Medicine

## 2023-12-01 MED ORDER — ALPRAZOLAM 0.5 MG PO TABS: ORAL_TABLET | ORAL | 5 refills | Status: DC

## 2023-12-04 ENCOUNTER — Ambulatory Visit: Admitting: Sports Medicine

## 2023-12-07 ENCOUNTER — Encounter: Payer: Self-pay | Admitting: Sports Medicine

## 2023-12-09 NOTE — Progress Notes (Addendum)
 Ben Jaimon Bugaj D.[facility] [address] [place] [address] Phone: [phone]   Assessment and Plan:     1. Primary osteoarthritis of left knee 2. Chronic pain of left knee  -Chronic with exacerbation, subsequent visit - Consistent with recurrent flare of osteoarthritis and meniscus tear with moderate to severe degenerative changes as seen on prior MRI - Patient received no relief and increased swelling after HA injection on 11/06/2023, and she received only temporary 4 to 6-week relief after intra-articular CSI performed on 09/14/2023 - Patient elects for Zilretta  injection at today's visit.  Tolerated well per note below - Start meloxicam  15 mg daily and may use remainder of meloxicam  15 mg daily as needed for breakthrough pain - Continue HEP and physical therapy as tolerated - recommend patient be fitted for OA knee brace due to chronic pain, instability, and pseudolaxity on physical exam   Procedure: Knee Joint Injection Side: Left Indication: Flare of osteoarthritis  Risks explained and consent was given verbally. The site was cleaned with alcohol prep. A needle was introduced with an anterio-lateral approach. Injection given using Zilretta  32 mg. This was well tolerated and resulted in symptomatic relief.  Needle was removed, hemostasis achieved, and post injection instructions were explained.   Pt was advised to call or return to clinic if these symptoms worsen or fail to improve as anticipated.   Pertinent previous records reviewed include none  Follow Up: As needed if no improvement or worsening of symptoms.  Could consider repeat Zilretta  if at least 3 months relief.  If no significant relief, could discuss orthopedic surgery referral   Subjective:   I, Leone Ralphs am a scribe for Dr. Cleora Daft.     Chief Complaint: Left knee pain    HPI:    05/21/23 Patient states left knee pain and continued right hip pain     09/14/23 Today patient states Left knee is in a lot of pain that started 2 weeks ago. Been going in PT for her hip labrel tear repair surgery and the PT thought it was a good idea to be seen. Patient states it feels like it did when she tore her meniscus. The right knee is starting to hurt as well. Pain is sharp and shooting. Has to use crutches sometimes. Also doing RICE   11/06/2023 Patient states ready for gel injection. Would like to discuss right foot was told to ask from endoc   11/17/2023 Patient states knee pain got worse after gel. Knee feels loose, swelling, and decreased ROM   12/10/2023 Patient states that the knee is not happy today. The meloxicam  has helped some but the sharp pain is still there and waking her up at night.   Relevant Historical Information: DM type ll Additional pertinent review of systems negative.   Current Outpatient Medications:    ALPRAZolam  (XANAX ) 0.5 MG tablet, TAKE 1 TABLET(0.5 MG) BY MOUTH THREE TIMES DAILY AS NEEDED FOR ANXIETY, Disp: 60 tablet, Rfl: 5   amphetamine -dextroamphetamine  (ADDERALL ) 20 MG tablet, Take 1 tablet (20 mg total) by mouth 2 (two) times daily., Disp: 180 tablet, Rfl: 0   Continuous Glucose Sensor (FREESTYLE LIBRE 3 SENSOR) MISC, 1 each by Does not apply route every 14 (fourteen) days., Disp: 6 each, Rfl: 3   cyanocobalamin  (VITAMIN B12) 1000 MCG/ML injection, INJECT INTRAMUSCULARLY 1 ML  EVERY 30 DAYS (DISCARD 28 DAYS  AFTER FIRST USE), Disp: 3 mL, Rfl: 2   EPINEPHrine  0.3 mg/0.3 mL IJ SOAJ injection,  Use as needed for hypersensitivity reaction, Disp: 1 each, Rfl: 0   Fexofenadine HCl (ALLEGRA ALLERGY PO), Take 1 tablet by mouth daily as needed (Allergies)., Disp: , Rfl:    glucose blood (ONETOUCH VERIO) test strip, Use 2x a day, Disp: 200 each, Rfl: 3   ibuprofen  (ADVIL ) 800 MG tablet, TAKE 1 TABLET( 800 MG TOTAL) BY MOUTH EVERY 8 HOURS FOR 10 DAYS. TAKE WITH FOOD, ALTERNATE WITH ACETAMINOPHEN , Disp: 30 tablet, Rfl: 0    levonorgestrel  (MIRENA ) 20 MCG/24HR IUD, 1 Intra Uterine Device (1 each total) by Intrauterine route once., Disp: 1 each, Rfl: 0   meloxicam  (MOBIC ) 15 MG tablet, Take 1 tablet (15 mg total) by mouth daily., Disp: 30 tablet, Rfl: 0   metFORMIN  (GLUCOPHAGE ) 1000 MG tablet, TAKE 2 TABLETS BY MOUTH DAILY WITH SUPPER., Disp: 180 tablet, Rfl: 3   metoprolol  succinate (TOPROL -XL) 25 MG 24 hr tablet, TAKE 1 TABLET BY MOUTH DAILY, Disp: 90 tablet, Rfl: 3   omeprazole  (PRILOSEC) 40 MG capsule, TAKE 1 CAPSULE BY MOUTH DAILY BEFORE SUPPER, Disp: 90 capsule, Rfl: 2   ondansetron  (ZOFRAN  ODT) 4 MG disintegrating tablet, Take 1 tablet (4 mg total) by mouth every 8 (eight) hours as needed for nausea or vomiting., Disp: 20 tablet, Rfl: 0   OZEMPIC , 0.25 OR 0.5 MG/DOSE, 2 MG/3ML SOPN, INJECT 0.5MG  UNDER THE SKIN ONCE A WEEK, Disp: 9 mL, Rfl: 3   promethazine  (PHENERGAN ) 25 MG tablet, Take 1 tablet (25 mg total) by mouth every 6 (six) hours as needed for nausea., Disp: 15 tablet, Rfl: 0   rizatriptan  (MAXALT ) 10 MG tablet, Take 1 tablet (10 mg total) by mouth as needed for migraine. May repeat in 2 hours if needed, Disp: 10 tablet, Rfl: 8   rosuvastatin  (CRESTOR ) 5 MG tablet, TAKE 1 TABLET(5 MG) BY MOUTH 2 TIMES A WEEK, Disp: 13 tablet, Rfl: 3   SYRINGE-NEEDLE, DISP, 3 ML 25G X 1" 3 ML MISC, Use for monthly B12 injections, Disp: 12 each, Rfl: 1   triamcinolone  cream (KENALOG ) 0.1 %, Apply 1 application topically 2 (two) times daily as needed (for itching)., Disp: 28.4 g, Rfl: 1   Objective:     Vitals:   12/10/23 1117  Weight: 190 lb 3.2 oz (86.3 kg)  Height: 5\' 4"  (1.626 m)      Body mass index is 32.65 kg/m.    Physical Exam:    Gen: Appears well, nad, nontoxic and pleasant Psych: Alert and oriented, appropriate mood and affect Neuro: sensation intact, strength is 5/5 with df/pf/inv/ev, muscle tone wnl Skin: no susupicious lesions or rashes   Left knee: Mild swelling No deformity Positive fluid  wave, joint milking ROM Flex 110, Ext 0 TTP medial joint line NTTP over the quad tendon, medial fem condyle, lat fem condyle, patella, plica, patella tendon, tibial tuberostiy, fibular head, posterior fossa, pes anserine bursa, gerdy's tubercle,   lateral jt line Neg anterior and posterior drawer Neg lachman Neg sag sign Pseudo positive varus stress that was difficult to assess due to patient's discomfort Pseudo positive valgus stress that was difficult to assess due to patient's discomfort Positive McMurray   Gait antalgic, favoring right leg   Electronically signed by:  Marshall Skeeter D.[facility] 11:26 AM 12/10/23

## 2023-12-10 ENCOUNTER — Ambulatory Visit: Admitting: Sports Medicine

## 2023-12-10 VITALS — BP 118/70 | HR 97 | Ht 64.0 in | Wt 190.2 lb

## 2023-12-10 DIAGNOSIS — M1712 Unilateral primary osteoarthritis, left knee: Secondary | ICD-10-CM

## 2023-12-10 DIAGNOSIS — G8929 Other chronic pain: Secondary | ICD-10-CM

## 2023-12-10 DIAGNOSIS — M25562 Pain in left knee: Secondary | ICD-10-CM | POA: Diagnosis not present

## 2023-12-10 MED ORDER — TRIAMCINOLONE ACETONIDE 32 MG IX SRER
32.0000 mg | Freq: Once | INTRA_ARTICULAR | Status: AC
  Administered 2023-12-10: 32 mg via INTRA_ARTICULAR

## 2023-12-10 NOTE — Patient Instructions (Signed)
 Stop daily meloxicam . Continue as needed. Limit use to once a week. - Start Tylenol  500 to 1000 mg tablets 2-3 times a day for day-to-day pain relief Continue Hep as tolerated. Is pain return after 3 months, Call and ask for repeat Zilretta  injection Is pain returns before 3 months return to clinic.

## 2024-01-02 ENCOUNTER — Encounter: Payer: Self-pay | Admitting: Emergency Medicine

## 2024-01-02 ENCOUNTER — Ambulatory Visit
Admission: EM | Admit: 2024-01-02 | Discharge: 2024-01-02 | Disposition: A | Discharge status: Home / Self Care | Attending: Family Medicine | Admitting: Family Medicine

## 2024-01-02 DIAGNOSIS — Z87442 Personal history of urinary calculi: Secondary | ICD-10-CM

## 2024-01-02 DIAGNOSIS — Z3202 Encounter for pregnancy test, result negative: Secondary | ICD-10-CM

## 2024-01-02 DIAGNOSIS — R109 Unspecified abdominal pain: Secondary | ICD-10-CM

## 2024-01-02 LAB — POCT URINALYSIS DIP (MANUAL ENTRY)
Bilirubin, UA: NEGATIVE
Blood, UA: NEGATIVE
Glucose, UA: NEGATIVE mg/dL
Ketones, POC UA: NEGATIVE mg/dL
Leukocytes, UA: NEGATIVE
Nitrite, UA: NEGATIVE
Protein Ur, POC: NEGATIVE mg/dL
Spec Grav, UA: <=1.005 — AB
Urobilinogen, UA: 0.2 U/dL
pH, UA: 5.5

## 2024-01-02 LAB — POCT URINE PREGNANCY: Preg Test, Ur: NEGATIVE

## 2024-01-02 MED ORDER — KETOROLAC TROMETHAMINE 30 MG/ML IJ SOLN
30.0000 mg | Freq: Once | INTRAMUSCULAR | Status: AC
  Administered 2024-01-02: 30 mg via INTRAMUSCULAR

## 2024-01-02 MED ORDER — TAMSULOSIN HCL 0.4 MG PO CAPS: 0.4000 mg | ORAL_CAPSULE | Freq: Every day | ORAL | 0 refills | Status: AC

## 2024-01-02 MED ORDER — HYDROCODONE-ACETAMINOPHEN 5-325 MG PO TABS: 1.0000 | ORAL_TABLET | Freq: Four times a day (QID) | ORAL | 0 refills | Status: DC | PRN

## 2024-01-02 MED ORDER — ONDANSETRON 4 MG PO TBDP: 4.0000 mg | ORAL_TABLET | Freq: Three times a day (TID) | ORAL | 0 refills | Status: AC | PRN

## 2024-01-02 MED ORDER — ONDANSETRON 4 MG PO TBDP
4.0000 mg | ORAL_TABLET | Freq: Once | ORAL | Status: AC
  Administered 2024-01-02: 4 mg via ORAL

## 2024-01-02 NOTE — Discharge Instructions (Addendum)
 You likely have a kidney stone. We gave you a shot of ketorolac  in the clinic which is a strong antiinflammatory medication. You may start taking ibuprofen  as needed for pain starting tomorrow- no ibuprofen  until tomorrow afternoon due to ketorolac . Take tylenol  650mg  every 6 hours as needed for pain. For severe pain, take 1 pill of hydrocodone -acetaminophen  every 6 hours as needed- this medicine can make you drowsy so mostly take at bedtime.  Zofran  4mg  under the tongue every 8 hours as needed for nausea and vomiting.   Take Tamsulosin  (flomax ) 1 tablet daily until you pass the stone. This medicine can make you feel dizzy, please drink plenty of water.  Follow-up with [facility] as needed, go to the ER if symptoms worsen.   If you develop any new or worsening symptoms or if your symptoms do not start to improve, please return here or follow-up with your primary care provider. If your symptoms are severe, please go to the emergency room.

## 2024-01-02 NOTE — ED Provider Notes (Signed)
 [facility]    CSN: [id number] Arrival date & time: 01/02/24  1409      History   Chief Complaint Chief Complaint  Patient presents with  . Nausea  . Flank Pain    HPI Lisa Duran is a 43 y.o. female.   Lisa Duran is a 43 y.o. female presenting for chief complaint of right sided flank pain, nausea without vomiting, achy back pain, and urinary frequency that started 2 days ago. Symptoms started with achy right flank pain, flank pain has worsened significantly since onset in the last 24 hours. She describes flank pain as a "spasm" that intensifies and improves randomly. History of kidney stones, states this feels very similar. Last kidney stone was 2-3 years ago. She does not drink dark sodas and reports minimal caffeine intake. Reports urinary frequency without urinary hesitancy, gross hematuria, dysuria, dizziness, headache, abdominal pain, low back pain, and fever/chills. She has been taking ibuprofen  400mg  and tylenol  extra strength alternating for pain with some relief. Pain and nausea worsened after she went to breakfast this morning with her husband.    Flank Pain   Past Medical History:  Diagnosis Date  . ADHD (attention deficit hyperactivity disorder)   . ALLERGIC RHINITIS   . Allergy   . Anemia   . ANXIETY   . Depression   . DIABETES MELLITUS, TYPE II   . Fibromyalgia 2018  . GERD (gastroesophageal reflux disease)   . MIGRAINE, COMMON   . Obesity, unspecified   . OVARIAN CYST     Patient Active Problem List   Diagnosis Date Noted  . Osteoarthritis of left knee 11/20/2023  . Tear of right acetabular labrum 07/21/2023  . B12 deficiency 05/09/2021  . Dyslipidemia 04/14/2019  . Enteritis 01/12/2019  . Sleep walking disorder 04/06/2017  . Iron  deficiency anemia 03/27/2017  . Fibromyalgia 02/03/2017  . GERD (gastroesophageal reflux disease) 06/24/2015  . Eczema 06/22/2015  . Depression 06/22/2015  . Tachycardia 06/16/2014  . Food allergy   .  ADHD (attention deficit hyperactivity disorder) 01/27/2011  . Anxiety 02/20/2010  . Allergic rhinitis 11/21/2009  . OVARIAN CYST 11/21/2009  . Diabetes mellitus type 2 with neurological manifestations (HCC) 11/20/2009  . Class 2 obesity in adult 11/20/2009  . Migraine without aura 11/20/2009    Past Surgical History:  Procedure Laterality Date  . KNEE ARTHROSCOPY Left 08/25/2022    OB History   No obstetric history on file.      Home Medications    Prior to Admission medications   Medication Sig Start Date End Date Taking? Authorizing Provider  ALPRAZolam  (XANAX ) 0.5 MG tablet TAKE 1 TABLET(0.5 MG) BY MOUTH THREE TIMES DAILY AS NEEDED FOR ANXIETY 12/01/23   Burns, Beckey Bourgeois, MD  amphetamine -dextroamphetamine  (ADDERALL ) 20 MG tablet Take 1 tablet (20 mg total) by mouth 2 (two) times daily. 09/10/23   Colene Dauphin, MD  Continuous Glucose Sensor (FREESTYLE LIBRE 3 SENSOR) MISC 1 each by Does not apply route every 14 (fourteen) days. 03/02/23   Emilie Harden, MD  cyanocobalamin  (VITAMIN B12) 1000 MCG/ML injection INJECT INTRAMUSCULARLY 1 ML  EVERY 30 DAYS (DISCARD 28 DAYS  AFTER FIRST USE) 11/25/23   Colene Dauphin, MD  EPINEPHrine  0.3 mg/0.3 mL IJ SOAJ injection Use as needed for hypersensitivity reaction 09/22/22   Burns, Beckey Bourgeois, MD  Fexofenadine HCl (ALLEGRA ALLERGY PO) Take 1 tablet by mouth daily as needed (Allergies).    [provider]  glucose blood (ONETOUCH VERIO) test strip Use 2x  a day 01/22/18   Emilie Harden, MD  ibuprofen  (ADVIL ) 800 MG tablet TAKE 1 TABLET( 800 MG TOTAL) BY MOUTH EVERY 8 HOURS FOR 10 DAYS. TAKE WITH FOOD, ALTERNATE WITH ACETAMINOPHEN  08/03/23   Wilhelmenia Harada, MD  levonorgestrel  (MIRENA ) 20 MCG/24HR IUD 1 Intra Uterine Device (1 each total) by Intrauterine route once. 10/03/13   Carolene Chute, MD  meloxicam  (MOBIC ) 15 MG tablet Take 1 tablet (15 mg total) by mouth daily. 11/17/23   Ulysees Gander, DO  metFORMIN  (GLUCOPHAGE ) 1000 MG  tablet TAKE 2 TABLETS BY MOUTH DAILY WITH SUPPER. 11/04/23   Emilie Harden, MD  metoprolol  succinate (TOPROL -XL) 25 MG 24 hr tablet TAKE 1 TABLET BY MOUTH DAILY 05/12/23   Burns, Beckey Bourgeois, MD  omeprazole  (PRILOSEC) 40 MG capsule TAKE 1 CAPSULE BY MOUTH DAILY BEFORE SUPPER 11/25/23   Burns, Beckey Bourgeois, MD  ondansetron  (ZOFRAN  ODT) 4 MG disintegrating tablet Take 1 tablet (4 mg total) by mouth every 8 (eight) hours as needed for nausea or vomiting. 06/17/19   Colene Dauphin, MD  OZEMPIC , 0.25 OR 0.5 MG/DOSE, 2 MG/3ML SOPN INJECT 0.5MG  UNDER THE SKIN ONCE A WEEK 06/08/23   Emilie Harden, MD  promethazine  (PHENERGAN ) 25 MG tablet Take 1 tablet (25 mg total) by mouth every 6 (six) hours as needed for nausea. 12/27/20   Colene Dauphin, MD  rizatriptan  (MAXALT ) 10 MG tablet Take 1 tablet (10 mg total) by mouth as needed for migraine. May repeat in 2 hours if needed 05/20/22   Colene Dauphin, MD  rosuvastatin  (CRESTOR ) 5 MG tablet TAKE 1 TABLET(5 MG) BY MOUTH 2 TIMES A WEEK 07/07/23   Burns, Beckey Bourgeois, MD  SYRINGE-NEEDLE, DISP, 3 ML 25G X 1" 3 ML MISC Use for monthly B12 injections 10/14/23   Colene Dauphin, MD  triamcinolone  cream (KENALOG ) 0.1 % Apply 1 application topically 2 (two) times daily as needed (for itching). 03/10/19   Colene Dauphin, MD    Family History Family History  Problem Relation Age of Onset  . Diabetes Mother   . Hyperlipidemia Mother   . CAD Mother   . CVA Mother   . Diabetes Father   . Hyperlipidemia Father   . Coronary artery disease Father   . Hypertension Father   . Cancer Father        esoph  . Stroke Father   . Cancer Maternal Grandfather 83       Prostate and Lung  . Diabetes Sister        Gestational  . Murrell Arrant' disease Maternal Aunt   . Cancer Maternal Uncle        Brain  . Stroke Paternal Grandmother   . Heart disease Paternal Grandmother   . Stroke Paternal Grandfather   . Heart disease Paternal Grandfather   . Diabetes Sister        Gestational and Type II   . Hashimoto's thyroiditis Cousin   . Cancer Maternal Uncle        Brain    Social History Social History   Tobacco Use  . Smoking status: Former    Current packs/day: 0.00    Types: Cigarettes    Quit date: 08/20/2002    Years since quitting: 21.3  . Smokeless tobacco: Never  . Tobacco comments:    single, separated from spouse 02/2010.   Vaping Use  . Vaping status: Never Used  Substance Use Topics  . Alcohol use: Yes    Comment: couple of drinks  a week  . Drug use: No     Allergies   Chicken allergy, Covid-19 mrna vacc (moderna), Other, Propofol , Basaglar  kwikpen [insulin  glargine], Adhesive [tape], Gluten meal, Peanut-containing drug products, Novolog  [insulin  aspart (human analog)], Tresiba  [insulin  degludec], and Victoza  [liraglutide ]   Review of Systems Review of Systems  Genitourinary:  Positive for flank pain.  Per HPI   Physical Exam Triage Vital Signs ED Triage Vitals  Encounter Vitals Group     BP 01/02/24 1413 (!) 138/100     Systolic BP Percentile --      Diastolic BP Percentile --      Pulse Rate 01/02/24 1413 (!) 106     Resp 01/02/24 1413 17     Temp --      Temp Source 01/02/24 1413 Oral     SpO2 --      Weight --      Height --      Head Circumference --      Peak Flow --      Pain Score 01/02/24 1418 9     Pain Loc --      Pain Education --      Exclude from Growth Chart --    No data found.  Updated Vital Signs BP (!) 138/100 (BP Location: Right Arm)   Pulse (!) 106   Resp 17   Visual Acuity Right Eye Distance:   Left Eye Distance:   Bilateral Distance:    Right Eye Near:   Left Eye Near:    Bilateral Near:     Physical Exam   UC Treatments / Results  Labs (all labs ordered are listed, but only abnormal results are displayed) Labs Reviewed  CBC  BASIC METABOLIC PANEL WITH GFR  POCT URINALYSIS DIP (MANUAL ENTRY)  POCT URINE PREGNANCY    EKG   Radiology No results found.  Procedures Procedures (including  critical care time)  Medications Ordered in UC Medications  ondansetron  (ZOFRAN -ODT) disintegrating tablet 4 mg (4 mg Oral Given 01/02/24 1427)  ketorolac  (TORADOL ) 30 MG/ML injection 30 mg (30 mg Intramuscular Given 01/02/24 1427)    Initial Impression / Assessment and Plan / UC Course  I have reviewed the triage vital signs and the nursing notes.  Pertinent labs & imaging results that were available during my care of the patient were reviewed by me and considered in my medical decision making (see chart for details).     *** Final Clinical Impressions(s) / UC Diagnoses   Final diagnoses:  Acute right flank pain   Discharge Instructions   None    ED Prescriptions   None    PDMP not reviewed this encounter.

## 2024-01-02 NOTE — ED Triage Notes (Signed)
 Pt c/o nausea, flank pain since this morning Thinks she is having kidney stone. Her flank pain began 2 days ago but she thought it was just back pain

## 2024-01-03 LAB — BASIC METABOLIC PANEL WITH GFR
BUN/Creatinine Ratio: 15 (ref 9–23)
BUN: 13 mg/dL (ref 6–24)
CO2: 22 mmol/L (ref 20–29)
Calcium: 10.1 mg/dL (ref 8.7–10.2)
Chloride: 99 mmol/L (ref 96–106)
Creatinine, Ser: 0.89 mg/dL (ref 0.57–1.00)
Glucose: 204 mg/dL — ABNORMAL HIGH (ref 70–99)
Potassium: 3.8 mmol/L (ref 3.5–5.2)
Sodium: 139 mmol/L (ref 134–144)
eGFR: 81 mL/min/{1.73_m2} (ref >59)

## 2024-01-03 LAB — CBC
Hematocrit: 47.4 % — ABNORMAL HIGH (ref 34.0–46.6)
Hemoglobin: 14.5 g/dL (ref 11.1–15.9)
MCH: 26.1 pg — ABNORMAL LOW (ref 26.6–33.0)
MCHC: 30.6 g/dL — ABNORMAL LOW (ref 31.5–35.7)
MCV: 85 fL (ref 79–97)
Platelets: 434 10*3/uL (ref 150–450)
RBC: 5.55 x10E6/uL — ABNORMAL HIGH (ref 3.77–5.28)
RDW: 13.8 % (ref 11.7–15.4)
WBC: 9 10*3/uL (ref 3.4–10.8)

## 2024-01-04 ENCOUNTER — Ambulatory Visit (HOSPITAL_COMMUNITY): Payer: Self-pay

## 2024-01-07 ENCOUNTER — Other Ambulatory Visit (INDEPENDENT_AMBULATORY_CARE_PROVIDER_SITE_OTHER)

## 2024-01-07 ENCOUNTER — Other Ambulatory Visit: Payer: Self-pay

## 2024-01-07 ENCOUNTER — Encounter: Payer: Self-pay | Admitting: Internal Medicine

## 2024-01-07 ENCOUNTER — Ambulatory Visit: Payer: Self-pay | Admitting: Internal Medicine

## 2024-01-07 DIAGNOSIS — R300 Dysuria: Secondary | ICD-10-CM | POA: Diagnosis not present

## 2024-01-07 LAB — URINALYSIS
Bilirubin Urine: NEGATIVE
Hgb urine dipstick: NEGATIVE
Ketones, ur: NEGATIVE
Leukocytes,Ua: NEGATIVE
Nitrite: NEGATIVE
Specific Gravity, Urine: <=1.005 — AB (ref 1.000–1.030)
Total Protein, Urine: NEGATIVE
Urine Glucose: NEGATIVE
Urobilinogen, UA: 0.2 (ref 0.0–1.0)
pH: 7 (ref 5.0–8.0)

## 2024-01-10 LAB — CULTURE, URINE COMPREHENSIVE

## 2024-01-11 MED ORDER — FLUCONAZOLE 150 MG PO TABS: 150.0000 mg | ORAL_TABLET | Freq: Once | ORAL | 0 refills | Status: AC

## 2024-01-11 MED ORDER — AMOXICILLIN-POT CLAVULANATE 875-125 MG PO TABS
1.0000 | ORAL_TABLET | Freq: Two times a day (BID) | ORAL | 0 refills | Status: AC
Start: 2024-01-11 — End: 2024-01-16

## 2024-01-11 NOTE — Addendum Note (Signed)
 Addended by: Colene Dauphin on: 01/11/2024 05:03 PM   Modules accepted: Orders

## 2024-02-02 ENCOUNTER — Encounter: Payer: Self-pay | Admitting: Internal Medicine

## 2024-02-02 MED ORDER — AMPHETAMINE-DEXTROAMPHETAMINE 20 MG PO TABS: 20.0000 mg | ORAL_TABLET | Freq: Two times a day (BID) | ORAL | 0 refills | Status: DC

## 2024-02-10 NOTE — Progress Notes (Unsigned)
 Ben Jackson D.[name] [address] [place] [address] Phone: [phone]   Assessment and Plan:     There are no diagnoses linked to this encounter.  ***   Pertinent previous records reviewed include ***    Follow Up: ***     Subjective:   I, Lisa Duran, am serving as a Neurosurgeon for Doctor [employer]  Chief Complaint: Left knee pain    HPI:    05/21/23 Patient states left knee pain and continued right hip pain    09/14/23 Today patient states Left knee is in a lot of pain that started 2 weeks ago. Been going in PT for her hip labrel tear repair surgery and the PT thought it was a good idea to be seen. Patient states it feels like it did when she tore her meniscus. The right knee is starting to hurt as well. Pain is sharp and shooting. Has to use crutches sometimes. Also doing RICE   11/06/2023 Patient states ready for gel injection. Would like to discuss right foot was told to ask from endoc   11/17/2023 Patient states knee pain got worse after gel. Knee feels loose, swelling, and decreased ROM    12/10/2023 Patient states that the knee is not happy today. The meloxicam  has helped some but the sharp pain is still there and waking her up at night.   02/11/2024 Patient states   Relevant Historical Information: DM type ll  Additional pertinent review of systems negative.   Current Outpatient Medications:    ALPRAZolam  (XANAX ) 0.5 MG tablet, TAKE 1 TABLET(0.5 MG) BY MOUTH THREE TIMES DAILY AS NEEDED FOR ANXIETY, Disp: 60 tablet, Rfl: 5   amphetamine -dextroamphetamine  (ADDERALL ) 20 MG tablet, Take 1 tablet (20 mg total) by mouth 2 (two) times daily., Disp: 180 tablet, Rfl: 0   Continuous Glucose Sensor (FREESTYLE LIBRE 3 SENSOR) MISC, 1 each by Does not apply route every 14 (fourteen) days., Disp: 6 each, Rfl: 3   cyanocobalamin  (VITAMIN B12) 1000 MCG/ML injection, INJECT INTRAMUSCULARLY 1 ML  EVERY 30 DAYS (DISCARD 28  DAYS  AFTER FIRST USE), Disp: 3 mL, Rfl: 2   EPINEPHrine  0.3 mg/0.3 mL IJ SOAJ injection, Use as needed for hypersensitivity reaction, Disp: 1 each, Rfl: 0   Fexofenadine HCl (ALLEGRA ALLERGY PO), Take 1 tablet by mouth daily as needed (Allergies)., Disp: , Rfl:    glucose blood (ONETOUCH VERIO) test strip, Use 2x a day, Disp: 200 each, Rfl: 3   HYDROcodone -acetaminophen  (NORCO/VICODIN) 5-325 MG tablet, Take 1 tablet by mouth every 6 (six) hours as needed., Disp: 4 tablet, Rfl: 0   ibuprofen  (ADVIL ) 800 MG tablet, TAKE 1 TABLET( 800 MG TOTAL) BY MOUTH EVERY 8 HOURS FOR 10 DAYS. TAKE WITH FOOD, ALTERNATE WITH ACETAMINOPHEN , Disp: 30 tablet, Rfl: 0   levonorgestrel  (MIRENA ) 20 MCG/24HR IUD, 1 Intra Uterine Device (1 each total) by Intrauterine route once., Disp: 1 each, Rfl: 0   meloxicam  (MOBIC ) 15 MG tablet, Take 1 tablet (15 mg total) by mouth daily., Disp: 30 tablet, Rfl: 0   metFORMIN  (GLUCOPHAGE ) 1000 MG tablet, TAKE 2 TABLETS BY MOUTH DAILY WITH SUPPER., Disp: 180 tablet, Rfl: 3   metoprolol  succinate (TOPROL -XL) 25 MG 24 hr tablet, TAKE 1 TABLET BY MOUTH DAILY, Disp: 90 tablet, Rfl: 3   omeprazole  (PRILOSEC) 40 MG capsule, TAKE 1 CAPSULE BY MOUTH DAILY BEFORE SUPPER, Disp: 90 capsule, Rfl: 2   ondansetron  (ZOFRAN -ODT) 4 MG disintegrating tablet, Take 1 tablet (4  mg total) by mouth every 8 (eight) hours as needed for nausea or vomiting., Disp: 20 tablet, Rfl: 0   OZEMPIC , 0.25 OR 0.5 MG/DOSE, 2 MG/3ML SOPN, INJECT 0.5MG  UNDER THE SKIN ONCE A WEEK, Disp: 9 mL, Rfl: 3   promethazine  (PHENERGAN ) 25 MG tablet, Take 1 tablet (25 mg total) by mouth every 6 (six) hours as needed for nausea., Disp: 15 tablet, Rfl: 0   rizatriptan  (MAXALT ) 10 MG tablet, Take 1 tablet (10 mg total) by mouth as needed for migraine. May repeat in 2 hours if needed, Disp: 10 tablet, Rfl: 8   rosuvastatin  (CRESTOR ) 5 MG tablet, TAKE 1 TABLET(5 MG) BY MOUTH 2 TIMES A WEEK, Disp: 13 tablet, Rfl: 3   SYRINGE-NEEDLE, DISP, 3 ML  25G X 1 3 ML MISC, Use for monthly B12 injections, Disp: 12 each, Rfl: 1   triamcinolone  cream (KENALOG ) 0.1 %, Apply 1 application topically 2 (two) times daily as needed (for itching)., Disp: 28.4 g, Rfl: 1   Objective:     There were no vitals filed for this visit.    There is no height or weight on file to calculate BMI.    Physical Exam:    ***   Electronically signed by:  Odis Mace D[name] 2:40 PM 02/10/24

## 2024-02-11 ENCOUNTER — Ambulatory Visit (INDEPENDENT_AMBULATORY_CARE_PROVIDER_SITE_OTHER)

## 2024-02-11 ENCOUNTER — Encounter: Payer: Self-pay | Admitting: Sports Medicine

## 2024-02-11 ENCOUNTER — Ambulatory Visit: Admitting: Sports Medicine

## 2024-02-11 ENCOUNTER — Ambulatory Visit: Payer: Self-pay | Admitting: Sports Medicine

## 2024-02-11 VITALS — BP 110/60 | HR 104 | Ht 64.0 in | Wt 191.0 lb

## 2024-02-11 DIAGNOSIS — M25562 Pain in left knee: Secondary | ICD-10-CM

## 2024-02-11 DIAGNOSIS — M1712 Unilateral primary osteoarthritis, left knee: Secondary | ICD-10-CM

## 2024-02-11 DIAGNOSIS — M25561 Pain in right knee: Secondary | ICD-10-CM

## 2024-02-11 DIAGNOSIS — G8929 Other chronic pain: Secondary | ICD-10-CM

## 2024-02-11 DIAGNOSIS — M25551 Pain in right hip: Secondary | ICD-10-CM | POA: Diagnosis not present

## 2024-02-11 NOTE — Patient Instructions (Addendum)
 Right knee xray - Use Tylenol  500 to 1000 mg tablets 2-3 times a day for day-to-day pain relief NDAIDS as needed. Make a follow up appointment with Dr. Addie Follow up with us  after seeing Dr. Addie.

## 2024-02-22 ENCOUNTER — Other Ambulatory Visit (INDEPENDENT_AMBULATORY_CARE_PROVIDER_SITE_OTHER)

## 2024-02-22 ENCOUNTER — Ambulatory Visit (INDEPENDENT_AMBULATORY_CARE_PROVIDER_SITE_OTHER): Admitting: Orthopedic Surgery

## 2024-02-22 DIAGNOSIS — M79604 Pain in right leg: Secondary | ICD-10-CM

## 2024-02-22 DIAGNOSIS — M25562 Pain in left knee: Secondary | ICD-10-CM

## 2024-02-23 ENCOUNTER — Encounter: Payer: Self-pay | Admitting: Orthopedic Surgery

## 2024-02-23 NOTE — Progress Notes (Unsigned)
 Office Visit Note   Patient: Lisa Duran           Date of Birth: 1981/03/18           MRN: [id number] Visit Date: 02/22/2024 Requested by: Geofm Glade PARAS, MD [address] [place],  [place] [address] PCP: Geofm Glade PARAS, MD  Subjective: Chief Complaint  Patient presents with   Right Leg - Pain   Left Knee - Pain    HPI: [name] is a 43 y.o. female who presents to the office reporting multiple orthopedic complaints today.  She does report left knee pain.  Last seen in July of last year.  Feels like she potentially reinjured the left knee while she is rehabilitating from her right hip labral repair.  Did have meniscal root repair and was doing very well until her right hip labral repair which happened about 7 months ago.  She has tried Zilretta  gel and cortisone without any help.  She describes significant pain with ambulation as well as pain which wakes her from sleep at night.  She has been using an off loader brace for 1-1/2 months which has helped some.  Does report some right radicular leg pain with nerve pain and radiation into the great toe.  Also describes low back pain and mid back pain along with occasional mild groin pain and thigh pain.  She reports decreased activities because of pain with ambulation..                ROS: All systems reviewed are negative as they relate to the chief complaint within the history of present illness.  Patient denies fevers or chills.  Assessment & Plan: Visit Diagnoses:  1. Pain in right leg   2. Left knee pain, unspecified chronicity     Plan: Impression is progressive arthritis in the medial compartment of the left knee.  Generally having global pain.  Based on her radiographic findings and severe clinical symptoms as well as failure of conservative treatment her best bet would be total knee replacement.  The risk and benefits are discussed with the patient include not limited to infection or vessel damage incomplete relief as well as the  extensive rehabilitation required.  Patient understands risk benefits and wishes to proceed.  All questions answered.  Regarding her right sided symptoms she is having radicular pain radiating into the foot and toe.  She has had physical therapy for this.  She has modified activities and also done home exercise program for her back.  Symptoms ongoing for longer than 2 months.  Plan MRI lumbar spine to evaluate right-sided radiculopathy with possible ESI's to follow.  Follow-Up Instructions: No follow-ups on file.   Orders:  Orders Placed This Encounter  Procedures   XR KNEE 3 VIEW LEFT   XR HIP UNILAT W OR W/O PELVIS 2-3 VIEWS RIGHT   XR Lumbar Spine 2-3 Views   MR Lumbar Spine w/o contrast   No orders of the defined types were placed in this encounter.     Procedures: No procedures performed   Clinical Data: No additional findings.  Objective: Vital Signs: There were no vitals taken for this visit.  Physical Exam:  Constitutional: Patient appears well-developed HEENT:  Head: Normocephalic Eyes:EOM are normal Neck: Normal range of motion Cardiovascular: Normal rate Pulmonary/chest: Effort normal Neurologic: Patient is alert Skin: Skin is warm Psychiatric: Patient has normal mood and affect  Ortho Exam: Ortho exam demonstrates no effusion in the left knee.  Has  both medial and lateral joint line tenderness.  Range of motion is about 3-1 20.  Pedal pulses palpable on the left and right inside.  No nerve root tension signs on the right.  Not too much groin pain with internal and external rotation of the leg.  Has good hip flexion abduction abduction strength.  The definite paresthesias L1 S1 bilaterally.  Some pain with forward and lateral bending.  No masses lymphadenopathy or skin changes noted in the back region.  Specialty Comments:  No specialty comments available.  Imaging: XR Lumbar Spine 2-3 Views Result Date: 02/23/2024 AP lateral radiographs lumbar spine reviewed.   No spondylolisthesis.  Minimal degenerative changes in the facet joints or between the vertebral bodies.  No acute fracture  XR KNEE 3 VIEW LEFT Result Date: 02/23/2024 AP lateral merchant radiographs left knee reviewed.  End-stage bone-on-bone arthritis is present in the medial compartment.  Moderate changes noted in the patellofemoral compartment.  No acute fracture.  Overall alignment slight varus  XR HIP UNILAT W OR W/O PELVIS 2-3 VIEWS RIGHT Result Date: 02/23/2024 AP pelvis lateral radiograph right hip reviewed.  No significant arthritic changes present.  No acute fracture.  Minimal spurring around the acetabulum.    PMFS History: Patient Active Problem List   Diagnosis Date Noted   Osteoarthritis of left knee 11/20/2023   Tear of right acetabular labrum 07/21/2023   B12 deficiency 05/09/2021   Dyslipidemia 04/14/2019   Enteritis 01/12/2019   Sleep walking disorder 04/06/2017   Iron  deficiency anemia 03/27/2017   Fibromyalgia 02/03/2017   GERD (gastroesophageal reflux disease) 06/24/2015   Eczema 06/22/2015   Depression 06/22/2015   Tachycardia 06/16/2014   Food allergy    ADHD (attention deficit hyperactivity disorder) 01/27/2011   Anxiety 02/20/2010   Allergic rhinitis 11/21/2009   OVARIAN CYST 11/21/2009   Diabetes mellitus type 2 with neurological manifestations (HCC) 11/20/2009   Class 2 obesity in adult 11/20/2009   Migraine without aura 11/20/2009   Past Medical History:  Diagnosis Date   ADHD (attention deficit hyperactivity disorder)    ALLERGIC RHINITIS    Allergy    Anemia    ANXIETY    Depression    DIABETES MELLITUS, TYPE II    Fibromyalgia 2018   GERD (gastroesophageal reflux disease)    MIGRAINE, COMMON    Obesity, unspecified    OVARIAN CYST     Family History  Problem Relation Age of Onset   Diabetes Mother    Hyperlipidemia Mother    CAD Mother    CVA Mother    Diabetes Father    Hyperlipidemia Father    Coronary artery disease Father     Hypertension Father    Cancer Father        esoph   Stroke Father    Cancer Maternal Grandfather 25       Prostate and Lung   Diabetes Sister        Gestational   Graves' disease Maternal Aunt    Cancer Maternal Uncle        Brain   Stroke Paternal Grandmother    Heart disease Paternal Grandmother    Stroke Paternal Grandfather    Heart disease Paternal Grandfather    Diabetes Sister        Gestational and Type II   Hashimoto's thyroiditis Cousin    Cancer Maternal Uncle        Brain    Past Surgical History:  Procedure Laterality Date   KNEE ARTHROSCOPY Left  08/25/2022   Social History   Occupational History   Not on file  Tobacco Use   Smoking status: Former    Current packs/day: 0.00    Types: Cigarettes    Quit date: 08/20/2002    Years since quitting: 21.5   Smokeless tobacco: Never   Tobacco comments:    single, separated from spouse 02/2010.   Vaping Use   Vaping status: Never Used  Substance and Sexual Activity   Alcohol use: Yes    Comment: couple of drinks a week   Drug use: No   Sexual activity: Not Currently    Birth control/protection: I.U.D.

## 2024-02-25 ENCOUNTER — Ambulatory Visit
Admission: RE | Admit: 2024-02-25 | Discharge: 2024-02-25 | Disposition: A | Discharge status: Home / Self Care | Source: Ambulatory Visit | Attending: Orthopedic Surgery | Admitting: Orthopedic Surgery

## 2024-02-25 DIAGNOSIS — M79604 Pain in right leg: Secondary | ICD-10-CM

## 2024-02-29 ENCOUNTER — Ambulatory Visit: Payer: Self-pay | Admitting: Orthopedic Surgery

## 2024-02-29 ENCOUNTER — Ambulatory Visit: Admitting: Orthopedic Surgery

## 2024-02-29 NOTE — Progress Notes (Signed)
 Hi Debbie.  Do you have Lisa Duran for knee replacement?  I just called her.  Her scan on her back looks good.

## 2024-03-02 ENCOUNTER — Encounter: Payer: Self-pay | Admitting: Orthopedic Surgery

## 2024-03-10 ENCOUNTER — Encounter: Payer: Self-pay | Admitting: Internal Medicine

## 2024-03-10 ENCOUNTER — Ambulatory Visit: Admitting: Internal Medicine

## 2024-03-10 VITALS — BP 120/80 | HR 92 | Ht 64.0 in | Wt 190.2 lb

## 2024-03-10 DIAGNOSIS — E663 Overweight: Secondary | ICD-10-CM

## 2024-03-10 DIAGNOSIS — E785 Hyperlipidemia, unspecified: Secondary | ICD-10-CM

## 2024-03-10 DIAGNOSIS — Z7984 Long term (current) use of oral hypoglycemic drugs: Secondary | ICD-10-CM

## 2024-03-10 DIAGNOSIS — E1149 Type 2 diabetes mellitus with other diabetic neurological complication: Secondary | ICD-10-CM | POA: Diagnosis not present

## 2024-03-10 LAB — POCT GLYCOSYLATED HEMOGLOBIN (HGB A1C): Hemoglobin A1C: 7.1 % — AB (ref 4.0–5.6)

## 2024-03-10 MED ORDER — OZEMPIC (1 MG/DOSE) 4 MG/3ML ~~LOC~~ SOPN: 1.0000 mg | PEN_INJECTOR | SUBCUTANEOUS | 3 refills | Status: AC

## 2024-03-10 NOTE — Progress Notes (Signed)
 =ubjective:     Patient ID: Lisa Duran, female   DOB: November 09, 1980, 43 y.o.   MRN: [id number]  HPI Ms Lisa Duran is a pleasant 43 y.o.  woman returning for f/u for DM2, non insulin  dependent now, uncontrolled, dx 05/2009, w/o complications. Last visit 4 months ago.  Interim history: No increased urination, blurry vision, nausea, chest pain. She lost approximately 70 pounds after starting  Ozempic .  She gained 13 pounds after having had a torn meniscus and knee surgery in 08/2022.  Afterwards, she developed right hip pain due to tear of right acetabular labrum >> she had surgery 07/2023 and then knee surgery 08/2023. She was not able to walk consistently - was in PT then got gel knee injections but they did not work.  She will need a L knee replacement - scheduled in 3 weeks. She gained 9 pounds before last visit, but weight is stable afterwards.  Reviewed HbA1c levels: Lab Results  Component Value Date   HGBA1C 6.8 (A) 11/04/2023   HGBA1C 6.7 (H) 05/22/2023   HGBA1C 6.4 03/02/2023   HGBA1C 6.5 (A) 10/31/2022   HGBA1C 6.9 (A) 06/27/2022   HGBA1C 7.0 (A) 02/20/2022   HGBA1C 6.9 (A) 10/08/2021   HGBA1C 7.6 (A) 07/01/2021   HGBA1C 7.5 (A) 02/21/2021   HGBA1C 7.6 (A) 10/16/2020   She is on:   - Metformin  1000 mg 2x a day  - Ozempic  0.5 mg weekly - After starting Ozempic , she had constipation, gas, nausea, but these improved.  She continues on Pepcid  and Mylanta prn. We stopped glipizide  ER and also Januvia . We tried Cycloset  0.8 mg daily >> stopped end of 11/2016 b/c GERD/AP/C We tried Trulicity  >> could not tolerate it: N/V/AP. She restarted Januvia  09/2016. She was on Invokana  100 mg in am (started 12/2014) >> 2 yeast inf >> tx with Diflucan ; she was exhausted and had nocturia >> stopped 03/12/2015 Tried Victoza  2 mo ago >> AP, severe GERD, inj site rxn Tried Januvia  >> tolerated it well, but stopped when Invokana  was suggested.  She was also on Actos, but taken off b/c good control. She had  problems tolerating insulins in the past.  She was previously on Lantus  and NovoLog .  Basaglar  and Tresiba  caused eczema and also GI symptoms.  We then switched to NPH (approximately 50 units a day) and regular insulin  but she was able to come off due to good control after starting Ozempic :  She has a One Touch Verio meter >> True Metrix.  She checks her sugars 1x a day: - am: 105-145, 157 >> 114-140 - 2h after b'fast: n/c  - lunch: 114, 134 >> n/c - 2h after lunch: n/c - dinner: 126 >> n/c - 2h after dinner: n/c >> 185-190 - bedtime: n/c  Prev.  On the CGM, but not covered anymore due to not being on insulin .   Prev.:  Lowest: 97 >> 114 Highest: 190 >> 190 She has hypoglycemia awareness in the 80s.  No CKD: Lab Results  Component Value Date   BUN 13 01/02/2024   Lab Results  Component Value Date   CREATININE 0.89 01/02/2024   No MAU: Lab Results  Component Value Date   MICRALBCREAT 28 05/09/2020   MICRALBCREAT 25.1 11/20/2009   + HL: Lab Results  Component Value Date   CHOL 171 11/20/2023   HDL 77.70 11/20/2023   LDLCALC 74 11/20/2023   TRIG 97.0 11/20/2023   CHOLHDL 2 11/20/2023  On Crestor  5 mg 2x a week.  -  Last eye exam 2025: reportedly No DR, IOP high - [facility].  - Resolved numbness and tingling in hand and feet.  She felt much better on Cymbalta  and Neurontin , but she is now off both -no return of symptoms.  Last foot exam 11/04/2023.  She had CP >> had a stress test 07/04/2015 >>  Normal.  She is on metoprolol  for tachycardia. She has fibromyalgia.   She also has a history of iron  deficiency anemia and had iron  infusions.   In 2019, she just got out of a 5-year relationship.  She was under a lot of stress.   Review of Systems + see HPI  Past Medical History:  Diagnosis Date   ADHD (attention deficit hyperactivity disorder)    ALLERGIC RHINITIS    Allergy    Anemia    ANXIETY    Depression    DIABETES MELLITUS, TYPE II    Fibromyalgia  2018   GERD (gastroesophageal reflux disease)    MIGRAINE, COMMON    Obesity, unspecified    OVARIAN CYST    Past Surgical History:  Procedure Laterality Date   KNEE ARTHROSCOPY Left 08/25/2022   Social History   Socioeconomic History   Marital status: Married    Spouse name: Not on file   Number of children: Not on file   Years of education: Not on file   Highest education level: Bachelor's degree (e.g., BA, AB, BS)  Occupational History   Not on file  Tobacco Use   Smoking status: Former    Current packs/day: 0.00    Types: Cigarettes    Quit date: 08/20/2002    Years since quitting: 21.5   Smokeless tobacco: Never   Tobacco comments:    single, separated from spouse 02/2010.   Vaping Use   Vaping status: Never Used  Substance and Sexual Activity   Alcohol use: Yes    Comment: couple of drinks a week   Drug use: No   Sexual activity: Not Currently    Birth control/protection: I.U.D.  Other Topics Concern   Not on file  Social History Narrative   Single, separated from spouse 02/2010   Social Drivers of Health   Financial Resource Strain: Low Risk  (11/17/2023)   Overall Financial Resource Strain (CARDIA)    Difficulty of Paying Living Expenses: Not very hard  Food Insecurity: No Food Insecurity (11/17/2023)   Hunger Vital Sign    Worried About Running Out of Food in the Last Year: Never true    Ran Out of Food in the Last Year: Never true  Transportation Needs: No Transportation Needs (11/17/2023)   PRAPARE - Administrator, Civil Service (Medical): No    Lack of Transportation (Non-Medical): No  Physical Activity: Sufficiently Active (11/17/2023)   Exercise Vital Sign    Days of Exercise per Week: 7 days    Minutes of Exercise per Session: 30 min  Stress: Stress Concern Present (11/17/2023)   [employer] of Occupational Health - Occupational Stress Questionnaire    Feeling of Stress : To some extent  Social Connections: Moderately Integrated  (11/17/2023)   Social Connection and Isolation Panel    Frequency of Communication with Friends and Family: Once a week    Frequency of Social Gatherings with Friends and Family: Once a week    Attends Religious Services: More than 4 times per year    Active Member of [employer] or Organizations: Yes    Attends Banker Meetings: More than  4 times per year    Marital Status: Married  Catering manager Violence: Not on file   Current Outpatient Medications on File Prior to Visit  Medication Sig Dispense Refill   ALPRAZolam  (XANAX ) 0.5 MG tablet TAKE 1 TABLET(0.5 MG) BY MOUTH THREE TIMES DAILY AS NEEDED FOR ANXIETY 60 tablet 5   amphetamine -dextroamphetamine  (ADDERALL ) 20 MG tablet Take 1 tablet (20 mg total) by mouth 2 (two) times daily. 180 tablet 0   Continuous Glucose Sensor (FREESTYLE LIBRE 3 SENSOR) MISC 1 each by Does not apply route every 14 (fourteen) days. 6 each 3   cyanocobalamin  (VITAMIN B12) 1000 MCG/ML injection INJECT INTRAMUSCULARLY 1 ML  EVERY 30 DAYS (DISCARD 28 DAYS  AFTER FIRST USE) 3 mL 2   EPINEPHrine  0.3 mg/0.3 mL IJ SOAJ injection Use as needed for hypersensitivity reaction 1 each 0   Fexofenadine HCl (ALLEGRA ALLERGY PO) Take 1 tablet by mouth daily as needed (Allergies).     glucose blood (ONETOUCH VERIO) test strip Use 2x a day 200 each 3   HYDROcodone -acetaminophen  (NORCO/VICODIN) 5-325 MG tablet Take 1 tablet by mouth every 6 (six) hours as needed. 4 tablet 0   ibuprofen  (ADVIL ) 800 MG tablet TAKE 1 TABLET( 800 MG TOTAL) BY MOUTH EVERY 8 HOURS FOR 10 DAYS. TAKE WITH FOOD, ALTERNATE WITH ACETAMINOPHEN  30 tablet 0   levonorgestrel  (MIRENA ) 20 MCG/24HR IUD 1 Intra Uterine Device (1 each total) by Intrauterine route once. 1 each 0   meloxicam  (MOBIC ) 15 MG tablet Take 1 tablet (15 mg total) by mouth daily. 30 tablet 0   metFORMIN  (GLUCOPHAGE ) 1000 MG tablet TAKE 2 TABLETS BY MOUTH DAILY WITH SUPPER. 180 tablet 3   metoprolol  succinate (TOPROL -XL) 25 MG 24 hr tablet  TAKE 1 TABLET BY MOUTH DAILY 90 tablet 3   omeprazole  (PRILOSEC) 40 MG capsule TAKE 1 CAPSULE BY MOUTH DAILY BEFORE SUPPER 90 capsule 2   ondansetron  (ZOFRAN -ODT) 4 MG disintegrating tablet Take 1 tablet (4 mg total) by mouth every 8 (eight) hours as needed for nausea or vomiting. 20 tablet 0   OZEMPIC , 0.25 OR 0.5 MG/DOSE, 2 MG/3ML SOPN INJECT 0.5MG  UNDER THE SKIN ONCE A WEEK 9 mL 3   promethazine  (PHENERGAN ) 25 MG tablet Take 1 tablet (25 mg total) by mouth every 6 (six) hours as needed for nausea. 15 tablet 0   rizatriptan  (MAXALT ) 10 MG tablet Take 1 tablet (10 mg total) by mouth as needed for migraine. May repeat in 2 hours if needed 10 tablet 8   rosuvastatin  (CRESTOR ) 5 MG tablet TAKE 1 TABLET(5 MG) BY MOUTH 2 TIMES A WEEK 13 tablet 3   SYRINGE-NEEDLE, DISP, 3 ML 25G X 1 3 ML MISC Use for monthly B12 injections 12 each 1   triamcinolone  cream (KENALOG ) 0.1 % Apply 1 application topically 2 (two) times daily as needed (for itching). 28.4 g 1   No current facility-administered medications on file prior to visit.   Allergies  Allergen Reactions   Chicken Allergy Anaphylaxis and Shortness Of Breath   Covid-19 Mrna Vacc (Moderna) Anaphylaxis   Other Itching, Swelling and Other (See Comments)    NUTS (of ANY kind): Lips and mouth swell, but no breathing impairment & migraines and eczema are triggered   Propofol  Shortness Of Breath    SOB, chest tightness, wheezing after colonoscopy on 09-18-15  Received propofol  for surgery 07/21/23 without complication   Basaglar  Kwikpen [Insulin  Glargine] Palpitations and Hypertension   Adhesive [Tape] Other (See Comments)    Makes  the skin VERY RED    Gluten Meal Diarrhea   Peanut-Containing Drug Products Itching, Swelling and Other (See Comments)    Lips and mouth swell, but no breathing impairment & migraines and eczema are triggered    Novolog  [Insulin  Aspart (Human Analog) (Yeast)] Rash   Tresiba  [Insulin  Degludec] Rash    eczema   Victoza   [Liraglutide ] Rash   Family History  Problem Relation Age of Onset   Diabetes Mother    Hyperlipidemia Mother    CAD Mother    CVA Mother    Diabetes Father    Hyperlipidemia Father    Coronary artery disease Father    Hypertension Father    Cancer Father        esoph   Stroke Father    Cancer Maternal Grandfather 81       Prostate and Lung   Diabetes Sister        Gestational   Graves' disease Maternal Aunt    Cancer Maternal Uncle        Brain   Stroke Paternal Grandmother    Heart disease Paternal Grandmother    Stroke Paternal Grandfather    Heart disease Paternal Grandfather    Diabetes Sister        Gestational and Type II   Hashimoto's thyroiditis Cousin    Cancer Maternal Uncle        Brain   Objective:   Physical Exam BP 120/80   Pulse 92   Ht 5' 4 (1.626 m)   Wt 190 lb 3.2 oz (86.3 kg)   SpO2 98%   BMI 32.65 kg/m   Wt Readings from Last 15 Encounters:  03/10/24 190 lb 3.2 oz (86.3 kg)  02/11/24 191 lb (86.6 kg)  12/10/23 190 lb 3.2 oz (86.3 kg)  11/20/23 186 lb (84.4 kg)  11/17/23 187 lb (84.8 kg)  11/06/23 187 lb (84.8 kg)  11/04/23 187 lb 12.8 oz (85.2 kg)  07/21/23 178 lb (80.7 kg)  07/16/23 179 lb 3.2 oz (81.3 kg)  05/22/23 178 lb (80.7 kg)  05/21/23 179 lb (81.2 kg)  05/12/23 179 lb (81.2 kg)  03/04/23 176 lb (79.8 kg)  03/02/23 176 lb 12.8 oz (80.2 kg)  02/11/23 178 lb (80.7 kg)   Constitutional: overweight, in NAD Eyes: EOMI, no exophthalmos ENT: no thyromegaly, no cervical lymphadenopathy Cardiovascular: tachycardia, RR, No MRG Respiratory: CTA B Musculoskeletal: no deformities, L knee in brace Skin: no rashes Neurological: no tremor with outstretched hands  Assessment:     1. DM2, now non insulin -dependent, uncontrolled, without complications - r/o autoimmunity Component     Latest Ref Rng 08/17/2012  Glutamic Acid Decarb Ab     <=1.0 U/mL <1.0  Pancreatic Islet Cell Antibody     < 5 JDF Units <5  C-Peptide     0.80 -  3.90 ng/mL 1.84  Glucose     70 - 99 mg/dL 895 (H)     Component     Latest Ref Rng 10/31/2022  Glucose     65 - 99 mg/dL 84   C-Peptide     9.19 - 3.85 ng/mL 1.47    2. Obesity class 1  3. HL  Plan:     1. Patient with longstanding, previously uncontrolled type 2 diabetes, on insulin  in the past but now off due to improved control.  She is on metformin  and weekly GLP-1 receptor agonist.  HbA1c at last visit was higher, at 6.8%, after a period of decreased mobility  due to orthopedic surgeries.  We did not change her regimen but discussed about starting to exercise consistently after the surgery is healed. - At today's visit, she is in a knee brace and awaiting knee replacement surgery.  She is not very active and sugars appear to be higher both in the morning and after dinner.  We discussed about the option of adding back NPH insulin  or increasing the Ozempic  dose.  She did have some GI side effects when we started Ozempic , but they improved.  She would like to try to increase the dose rather than adding back insulin .  I sent a prescription for the 1 mg of Ozempic  to her pharmacy.  Will continue metformin  for now. - I suggested to:  Patient Instructions  Please continue: - Metformin  1000 mg 2x a day with meals  Please increase: - Ozempic  1 mg weekly  Please return in 4 months.  - we checked her HbA1c: 7.1% (higher) - advised to check sugars at different times of the day - 4x a day, rotating check times - advised for yearly eye exams >> she is UTD - will check an ACR today - return to clinic in 4 months  2. Obesity class 1 -Ozempic  was initially poorly tolerated but she then started to tolerate it better and now has no consistent side effects from it -She lost approximately 70 pounds after starting Ozempic  but then gained gained 9 pounds before last visit due to inactivity related to her surgeries. -she appears to have gained 3 pounds since last visit, but this may be related to  her knee  brace  3. HL - Latest lipid was reviewed and was at goal: Lab Results  Component Value Date   CHOL 171 11/20/2023   HDL 77.70 11/20/2023   LDLCALC 74 11/20/2023   TRIG 97.0 11/20/2023   CHOLHDL 2 11/20/2023  -She continues on Crestor  5 mg twice a week.  She has muscle and joint aches but she does not feel that these are related to the statin, but to her fibromyalgia  Lela Fendt, MD PhD [facility] Endocrinology

## 2024-03-10 NOTE — Patient Instructions (Addendum)
 Please continue: - Metformin  1000 mg 2x a day with meals  Please increase: - Ozempic  1 mg weekly  Please return in 4 months.

## 2024-03-11 ENCOUNTER — Ambulatory Visit: Payer: Self-pay | Admitting: Internal Medicine

## 2024-03-11 LAB — MICROALBUMIN / CREATININE URINE RATIO
Creatinine, Urine: 70 mg/dL (ref 20–275)
Microalb Creat Ratio: 4 mg/g{creat} (ref <30)
Microalb, Ur: 0.3 mg/dL

## 2024-03-16 ENCOUNTER — Ambulatory Visit: Admitting: Orthopedic Surgery

## 2024-03-28 ENCOUNTER — Other Ambulatory Visit: Payer: Self-pay | Admitting: Internal Medicine

## 2024-03-28 ENCOUNTER — Encounter (HOSPITAL_COMMUNITY): Payer: Self-pay

## 2024-03-28 ENCOUNTER — Encounter: Payer: Self-pay | Admitting: Sports Medicine

## 2024-03-28 NOTE — Pre-Procedure Instructions (Signed)
 Surgical Instructions   Your procedure is scheduled on March 31, 2024. Report to [facility] Main Entrance A at 5:30 A.M., then check in with the Admitting office. Any questions or running late day of surgery: call [phone]  Questions prior to your surgery date: call [phone], Monday-Friday, 8am-4pm. If you experience any cold or flu symptoms such as cough, fever, chills, shortness of breath, etc. between now and your scheduled surgery, please notify us  at the above number.     Remember:  Do not eat after midnight the night before your surgery  You may drink clear liquids until 4:30 AM the morning of your surgery.   Clear liquids allowed are: Water , Non-Citrus Juices (without pulp), Carbonated Beverages, Clear Tea (no milk, honey, etc.), Black Coffee Only (NO MILK, CREAM OR POWDERED CREAMER of any kind), and Gatorade.  Patient Instructions  The night before surgery:  No food after midnight. ONLY clear liquids after midnight  The day of surgery (if you have diabetes): Drink ONE (1) 12 oz G2 given to you in your pre admission testing appointment by 4:30 AM the morning of surgery. Drink in one sitting. Do not sip.  This drink was given to you during your hospital  pre-op appointment visit.  Nothing else to drink after completing the  12 oz bottle of G2.         If you have questions, please contact your surgeon's office.   Take these medicines the morning of surgery with A SIP OF WATER : metoprolol  succinate (TOPROL -XL)    May take these medicines IF NEEDED: ALPRAZolam  (XANAX )  EPINEPHrine  Pen fexofenadine (ALLEGRA)  ondansetron  (ZOFRAN -ODT)  promethazine  (PHENERGAN )  rizatriptan  (MAXALT )    One week prior to surgery, STOP taking any Aspirin  (unless otherwise instructed by your surgeon) Aleve, Naproxen, Ibuprofen , Motrin , Advil , Goody's, BC's, all herbal medications, fish oil, and non-prescription vitamins.   WHAT DO I DO ABOUT MY DIABETES MEDICATION?   Do not  take metFORMIN  (GLUCOPHAGE ) the morning of surgery.  STOP taking your Semaglutide  (OZEMPIC ) one week prior to surgery. DO NOT take any doses after August 13th.   HOW TO MANAGE YOUR DIABETES BEFORE AND AFTER SURGERY  Why is it important to control my blood sugar before and after surgery? Improving blood sugar levels before and after surgery helps healing and can limit problems. A way of improving blood sugar control is eating a healthy diet by:  Eating less sugar and carbohydrates  Increasing activity/exercise  Talking with your doctor about reaching your blood sugar goals High blood sugars (greater than 180 mg/dL) can raise your risk of infections and slow your recovery, so you will need to focus on controlling your diabetes during the weeks before surgery. Make sure that the doctor who takes care of your diabetes knows about your planned surgery including the date and location.  How do I manage my blood sugar before surgery? Check your blood sugar at least 4 times a day, starting 2 days before surgery, to make sure that the level is not too high or low.  Check your blood sugar the morning of your surgery when you wake up and every 2 hours until you get to the Short Stay unit.  If your blood sugar is less than 70 mg/dL, you will need to treat for low blood sugar: Do not take insulin . Treat a low blood sugar (less than 70 mg/dL) with  cup of clear juice (cranberry or apple), 4 glucose tablets, OR glucose gel. Recheck blood sugar in 15  minutes after treatment (to make sure it is greater than 70 mg/dL). If your blood sugar is not greater than 70 mg/dL on recheck, call [phone] for further instructions. Report your blood sugar to the short stay nurse when you get to Short Stay.  If you are admitted to the hospital after surgery: Your blood sugar will be checked by the staff and you will probably be given insulin  after surgery (instead of oral diabetes medicines) to make sure you have  good blood sugar levels. The goal for blood sugar control after surgery is 80-180 mg/dL.                      Do NOT Smoke (Tobacco/Vaping) for 24 hours prior to your procedure.  If you use a CPAP at night, you may bring your mask/headgear for your overnight stay.   You will be asked to remove any contacts, glasses, piercing's, hearing aid's, dentures/partials prior to surgery. Please bring cases for these items if needed.    Patients discharged the day of surgery will not be allowed to drive home, and someone needs to stay with them for 24 hours.  SURGICAL WAITING ROOM VISITATION Patients may have no more than 2 support people in the waiting area - these visitors may rotate.   Pre-op nurse will coordinate an appropriate time for 1 ADULT support person, who may not rotate, to accompany patient in pre-op.  Children under the age of 95 must have an adult with them who is not the patient and must remain in the main waiting area with an adult.  If the patient needs to stay at the hospital during part of their recovery, the visitor guidelines for inpatient rooms apply.  Please refer to the [facility] website for the visitor guidelines for any additional information.   If you received a COVID test during your pre-op visit  it is requested that you wear a mask when out in public, stay away from anyone that may not be feeling well and notify your surgeon if you develop symptoms. If you have been in contact with anyone that has tested positive in the last 10 days please notify you surgeon.      Pre-operative 5 CHG Bathing Instructions   You can play a key role in reducing the risk of infection after surgery. Your skin needs to be as free of germs as possible. You can reduce the number of germs on your skin by washing with CHG (chlorhexidine  gluconate) soap before surgery. CHG is an antiseptic soap that kills germs and continues to kill germs even after washing.   DO NOT use if you have an  allergy to chlorhexidine /CHG or antibacterial soaps. If your skin becomes reddened or irritated, stop using the CHG and notify one of our RNs at [phone].   Please shower with the CHG soap starting 4 days before surgery using the following schedule:     Please keep in mind the following:  DO NOT shave, including legs and underarms, starting the day of your first shower.   You may shave your face at any point before/day of surgery.  Place clean sheets on your bed the day you start using CHG soap. Use a clean washcloth (not used since being washed) for each shower. DO NOT sleep with pets once you start using the CHG.   CHG Shower Instructions:  Wash your face and private area with normal soap. If you choose to wash your hair, wash first with  your normal shampoo.  After you use shampoo/soap, rinse your hair and body thoroughly to remove shampoo/soap residue.  Turn the water  OFF and apply about 3 tablespoons (45 ml) of CHG soap to a CLEAN washcloth.  Apply CHG soap ONLY FROM YOUR NECK DOWN TO YOUR TOES (washing for 3-5 minutes)  DO NOT use CHG soap on face, private areas, open wounds, or sores.  Pay special attention to the area where your surgery is being performed.  If you are having back surgery, having someone wash your back for you may be helpful. Wait 2 minutes after CHG soap is applied, then you may rinse off the CHG soap.  Pat dry with a clean towel  Put on clean clothes/pajamas   If you choose to wear lotion, please use ONLY the CHG-compatible lotions that are listed below.  Additional instructions for the day of surgery: DO NOT APPLY any lotions, deodorants, cologne, or perfumes.   Do not bring valuables to the hospital. [facility] is not responsible for any belongings/valuables. Do not wear nail polish, gel polish, artificial nails, or any other type of covering on natural nails (fingers and toes) Do not wear jewelry or makeup Put on clean/comfortable clothes.  Please  brush your teeth.  Ask your nurse before applying any prescription medications to the skin.     CHG Compatible Lotions   Aveeno Moisturizing lotion  Cetaphil Moisturizing Cream  Cetaphil Moisturizing Lotion  Clairol Herbal Essence Moisturizing Lotion, Dry Skin  Clairol Herbal Essence Moisturizing Lotion, Extra Dry Skin  Clairol Herbal Essence Moisturizing Lotion, Normal Skin  Curel Age Defying Therapeutic Moisturizing Lotion with Alpha Hydroxy  Curel Extreme Care Body Lotion  Curel Soothing Hands Moisturizing Hand Lotion  Curel Therapeutic Moisturizing Cream, Fragrance-Free  Curel Therapeutic Moisturizing Lotion, Fragrance-Free  Curel Therapeutic Moisturizing Lotion, Original Formula  Eucerin Daily Replenishing Lotion  Eucerin Dry Skin Therapy Plus Alpha Hydroxy Crme  Eucerin Dry Skin Therapy Plus Alpha Hydroxy Lotion  Eucerin Original Crme  Eucerin Original Lotion  Eucerin Plus Crme Eucerin Plus Lotion  Eucerin TriLipid Replenishing Lotion  Keri Anti-Bacterial Hand Lotion  Keri Deep Conditioning Original Lotion Dry Skin Formula Softly Scented  Keri Deep Conditioning Original Lotion, Fragrance Free Sensitive Skin Formula  Keri Lotion Fast Absorbing Fragrance Free Sensitive Skin Formula  Keri Lotion Fast Absorbing Softly Scented Dry Skin Formula  Keri Original Lotion  Keri Skin Renewal Lotion Keri Silky Smooth Lotion  Keri Silky Smooth Sensitive Skin Lotion  Nivea Body Creamy Conditioning Oil  Nivea Body Extra Enriched Lotion  Nivea Body Original Lotion  Nivea Body Sheer Moisturizing Lotion Nivea Crme  Nivea Skin Firming Lotion  NutraDerm 30 Skin Lotion  NutraDerm Skin Lotion  NutraDerm Therapeutic Skin Cream  NutraDerm Therapeutic Skin Lotion  ProShield Protective Hand Cream  Provon moisturizing lotion  Please read over the following fact sheets that you were given.

## 2024-03-29 ENCOUNTER — Encounter (HOSPITAL_COMMUNITY)
Admission: RE | Admit: 2024-03-29 | Discharge: 2024-03-29 | Disposition: A | Discharge status: Home / Self Care | Source: Ambulatory Visit | Attending: Orthopedic Surgery | Admitting: Orthopedic Surgery

## 2024-03-29 ENCOUNTER — Other Ambulatory Visit: Payer: Self-pay

## 2024-03-29 ENCOUNTER — Encounter (HOSPITAL_COMMUNITY): Payer: Self-pay

## 2024-03-29 VITALS — BP 104/77 | HR 90 | Temp 98.6°F | Resp 18 | Ht 64.0 in | Wt 189.0 lb

## 2024-03-29 DIAGNOSIS — Z7984 Long term (current) use of oral hypoglycemic drugs: Secondary | ICD-10-CM | POA: Insufficient documentation

## 2024-03-29 DIAGNOSIS — M1712 Unilateral primary osteoarthritis, left knee: Secondary | ICD-10-CM | POA: Insufficient documentation

## 2024-03-29 DIAGNOSIS — Z01812 Encounter for preprocedural laboratory examination: Secondary | ICD-10-CM | POA: Insufficient documentation

## 2024-03-29 DIAGNOSIS — Z87891 Personal history of nicotine dependence: Secondary | ICD-10-CM | POA: Diagnosis not present

## 2024-03-29 DIAGNOSIS — Z7985 Long-term (current) use of injectable non-insulin antidiabetic drugs: Secondary | ICD-10-CM | POA: Diagnosis not present

## 2024-03-29 DIAGNOSIS — Z79899 Other long term (current) drug therapy: Secondary | ICD-10-CM | POA: Insufficient documentation

## 2024-03-29 DIAGNOSIS — Z01818 Encounter for other preprocedural examination: Secondary | ICD-10-CM

## 2024-03-29 DIAGNOSIS — M48061 Spinal stenosis, lumbar region without neurogenic claudication: Secondary | ICD-10-CM | POA: Diagnosis not present

## 2024-03-29 DIAGNOSIS — E119 Type 2 diabetes mellitus without complications: Secondary | ICD-10-CM | POA: Diagnosis not present

## 2024-03-29 DIAGNOSIS — M51369 Other intervertebral disc degeneration, lumbar region without mention of lumbar back pain or lower extremity pain: Secondary | ICD-10-CM | POA: Diagnosis not present

## 2024-03-29 DIAGNOSIS — R000 Tachycardia, unspecified: Secondary | ICD-10-CM | POA: Diagnosis not present

## 2024-03-29 HISTORY — DX: Cardiac arrhythmia, unspecified: I49.9

## 2024-03-29 HISTORY — DX: Personal history of urinary calculi: Z87.442

## 2024-03-29 HISTORY — DX: Personal history of other diseases of the digestive system: Z87.19

## 2024-03-29 HISTORY — DX: Unspecified osteoarthritis, unspecified site: M19.90

## 2024-03-29 HISTORY — DX: Other complications of anesthesia, initial encounter: T88.59XA

## 2024-03-29 LAB — CBC
HCT: 42 % (ref 36.0–46.0)
Hemoglobin: 13.1 g/dL (ref 12.0–15.0)
MCH: 25.5 pg — ABNORMAL LOW (ref 26.0–34.0)
MCHC: 31.2 g/dL (ref 30.0–36.0)
MCV: 81.9 fL (ref 80.0–100.0)
Platelets: 348 K/uL (ref 150–400)
RBC: 5.13 MIL/uL — ABNORMAL HIGH (ref 3.87–5.11)
RDW: 15.3 % (ref 11.5–15.5)
WBC: 8.6 K/uL (ref 4.0–10.5)
nRBC: 0 % (ref 0.0–0.2)

## 2024-03-29 LAB — URINALYSIS, W/ REFLEX TO CULTURE (INFECTION SUSPECTED)
Bilirubin Urine: NEGATIVE
Glucose, UA: NEGATIVE mg/dL
Hgb urine dipstick: NEGATIVE
Ketones, ur: NEGATIVE mg/dL
Leukocytes,Ua: NEGATIVE
Nitrite: NEGATIVE
Protein, ur: NEGATIVE mg/dL
Specific Gravity, Urine: 1.016 (ref 1.005–1.030)
pH: 7 (ref 5.0–8.0)

## 2024-03-29 LAB — BASIC METABOLIC PANEL WITH GFR
Anion gap: 11 (ref 5–15)
BUN: 6 mg/dL (ref 6–20)
CO2: 24 mmol/L (ref 22–32)
Calcium: 8.9 mg/dL (ref 8.9–10.3)
Chloride: 100 mmol/L (ref 98–111)
Creatinine, Ser: 0.69 mg/dL (ref 0.44–1.00)
GFR, Estimated: >60 mL/min (ref >60)
Glucose, Bld: 110 mg/dL — ABNORMAL HIGH (ref 70–99)
Potassium: 3.6 mmol/L (ref 3.5–5.1)
Sodium: 135 mmol/L (ref 135–145)

## 2024-03-29 LAB — GLUCOSE, CAPILLARY: Glucose-Capillary: 157 mg/dL — ABNORMAL HIGH (ref 70–99)

## 2024-03-29 LAB — SURGICAL PCR SCREEN
MRSA, PCR: NEGATIVE
Staphylococcus aureus: NEGATIVE

## 2024-03-29 LAB — HEMOGLOBIN A1C
Hgb A1c MFr Bld: 6.7 % — ABNORMAL HIGH (ref 4.8–5.6)
Mean Plasma Glucose: 145.59 mg/dL

## 2024-03-29 NOTE — Progress Notes (Signed)
 PCP - Dr. Glade Hope Cardiologist - Dr. Lonni Cash - last office visit 03/08/2021 Endocrinologist - Dr. Lela Lands - last office visit 03/10/2024  PPM/ICD - Denies Device Orders - n/a Rep Notified - n/a  Chest x-ray - n/a EKG - 07/15/2023 Stress Test - 01/29/2021 ECHO - 01/28/2021 Cardiac Cath - Denies  Sleep Study - Pt has been tested for OSA, but result negative CPAP - n/a  Pt is DM2. She checks her blood sugar 1x/day. Normal fasting glucose 110s. CBG at pre-op appointment 157. A1c result pending (ordered by surgeon)  Last dose of GLP1 agonist- Last dose of Ozempic  August 9th. GLP1 instructions: Pt instructed to continue holding until after surgery  Blood Thinner Instructions: n/a Aspirin  Instructions: n/a  ERAS Protcol - Clear liquids until 0430 morning of surgery PRE-SURGERY Ensure or G2- G2 given to pt with instructions  COVID TEST- n/a   Anesthesia review: Yes. Hx of DM2 and ST. Pt denies any cardiopulmonary issues (all resolved with metoprolol ). Recently had surgery 07/2023 for right labrum tear repair with no issues, although pt notes have nystagmus for a short period after emerging from anesthesia.    Patient denies shortness of breath, fever, cough and chest pain at PAT appointment. Pt denies any respiratory illness/infection in the last two months.   All instructions explained to the patient, with a verbal understanding of the material. Patient agrees to go over the instructions while at home for a better understanding. Patient also instructed to self quarantine after being tested for COVID-19. The opportunity to ask questions was provided.

## 2024-03-30 ENCOUNTER — Other Ambulatory Visit: Payer: Self-pay

## 2024-03-30 DIAGNOSIS — M25551 Pain in right hip: Secondary | ICD-10-CM

## 2024-03-30 MED ORDER — TRANEXAMIC ACID 1000 MG/10ML IV SOLN
2000.0000 mg | INTRAVENOUS | Status: AC
  Filled 2024-03-30 (×2): qty 20

## 2024-03-30 NOTE — Progress Notes (Signed)
 Anesthesia Chart Review:  Case: [id number] Date/Time: 03/31/24 0715   Procedure: ARTHROPLASTY, KNEE, TOTAL (Left: Knee)   Anesthesia type: General   Diagnosis: Primary osteoarthritis of left knee [M17.12]   Pre-op diagnosis: left knee osteoarthritis   [facility]: [facility] ROOM [facility] / [facility]   Surgeons: Addie Cordella Hamilton, MD       DISCUSSION: Patient is a 43 year old female scheduled for the above procedure.   History includes former smoker (quit 08/20/02), DM2, sinus tachycardia (on metoprolol ), GERD, hiatal hernia, fibromyalgia, migraines, ADHD, anemia, right hip labral repair (07/21/23), osteoarthritis. She reported prior negative sleep study.  For anesthesia history, she reported prior history of waking up with propofol  and not remembering for a short time after prior knee surgery and having SOB and chest tightness after EGD in 2017 and short term nystagmus after 2024 hip surgery.      She had cardiology evaluation by Dr. Verlin initially on 12/19/20 for tachycardia, dyspnea with occasional chest pressure. TSH was normal and had not noted much change when Adderall  held for a month.  Some improvement in dyspnea after an iron  infusion. He ordered long term monitor, echo, and exercise myocardial stress test. May 2022 monitor was considered overall normal with SR and very rare PVCs/PACs. 01/28/21 echo showed LVEF 65 to 70%, no regional wall motion abnormalities, normal RV systolic function, mild aortic valve calcification and sclerosis but no evidence of aortic stenosis or aortic regurgitation. 01/29/21 stress test was normal, low risk, EF 72%.  Continue Toprol  recommended with follow-up around 2 years. She says symptoms all resolved after starting metoprolol . She denied SOB and chest pain at PAT RN visit.    A1c 6.7% on 03/29/24. Meds include metformin , Ozempic  (increased to 1 mg weekly at 03/10/24 visit due to A1c 7.1%). Last Ozempic  03/19/24 and will hold until after surgery.    Anesthesia team to  evaluate on the day of surgery.    VS: BP 104/77   Pulse 90   Temp 37 C   Resp 18   Ht 5' 4 (1.626 m)   Wt 85.7 kg   LMP 11/10/2023 (Approximate)   SpO2 99%   BMI 32.44 kg/m   PROVIDERS: Geofm Glade PARAS, MD is PCP  Verlin Bruckner, MD is cardiologist Trixie File, MD is endocrinologist   LABS: Labs reviewed: Acceptable for surgery. (all labs ordered are listed, but only abnormal results are displayed)  Labs Reviewed  GLUCOSE, CAPILLARY - Abnormal; Notable for the following components:      Result Value   Glucose-Capillary 157 (*)    All other components within normal limits  HEMOGLOBIN A1C - Abnormal; Notable for the following components:   Hgb A1c MFr Bld 6.7 (*)    All other components within normal limits  CBC - Abnormal; Notable for the following components:   RBC 5.13 (*)    MCH 25.5 (*)    All other components within normal limits  BASIC METABOLIC PANEL WITH GFR - Abnormal; Notable for the following components:   Glucose, Bld 110 (*)    All other components within normal limits  URINALYSIS, W/ REFLEX TO CULTURE (INFECTION SUSPECTED) - Abnormal; Notable for the following components:   Bacteria, UA RARE (*)    All other components within normal limits  SURGICAL PCR SCREEN     IMAGES: MRI L-spine 02/25/24: IMPRESSION: Mild canal stenosis at L2-L3 secondary to disc bulge. No significant foraminal stenosis.  Xray left knee 02/22/24: AP lateral merchant radiographs left knee reviewed.  End-stage  bone-on-bone arthritis is present in the medial compartment.  Moderate  changes noted in the patellofemoral compartment.  No acute fracture.   Overall alignment slight varus    EKG: 07/16/23: Sinus rhythm at 82 bpm with short PR (PR 104 ms) Low voltage QRS Borderline ECG - Previous tracing on 12/19/20 showed ST at 115 bpm, PR 136 ms.   CV: Exercise Myocardial Perfusion Study 01/29/21: Fair exercise capacity, achieved 7 METS Normal BP response to  exercise Upsloping ST segment depression was noted during stress. No EKG evidence of ischemia The left ventricular ejection fraction is hyperdynamic (>65%). Nuclear stress EF: 72%. The study is normal. This is a low risk study.     Echo 01/28/21: IMPRESSIONS   1. Left ventricular ejection fraction, by estimation, is 65 to 70%. The  left ventricle has normal function. The left ventricle has no regional  wall motion abnormalities. Left ventricular diastolic parameters were  normal. The average left ventricular  global longitudinal strain is -20.9 %. The global longitudinal strain is  normal.   2. Right ventricular systolic function is normal. The right ventricular  size is normal.   3. The mitral valve is normal in structure. No evidence of mitral valve  regurgitation. No evidence of mitral stenosis.   4. The aortic valve is tricuspid. There is mild calcification of the  aortic valve. Aortic valve regurgitation is not visualized. Mild aortic  valve sclerosis is present, with no evidence of aortic valve stenosis.   5. The inferior vena cava is normal in size with greater than 50%  respiratory variability, suggesting right atrial pressure of 3 mmHg.  - Comparison(s): No prior Echocardiogram.  - Conclusion(s)/Recommendation(s): Normal biventricular function without  evidence of hemodynamically significant valvular heart disease.      Long term monitor 12/22/20 - 01/05/21: Sinus rhythm ( Patient had a min HR of 58 bpm, max HR of 165 bpm, and avg HR of 95 bpm) Rare premature atrial contractions Rare premature ventricular contractions.     Past Medical History:  Diagnosis Date   ADHD (attention deficit hyperactivity disorder)    ALLERGIC RHINITIS    Allergy    Anemia    ANXIETY    Arthritis    left knee   Complication of anesthesia    2017 propofol  for EGD chest tightness, SOB. 2024 surgery had nystagmus after surgery   Depression    DIABETES MELLITUS, TYPE II    Dysrhythmia     Sinus Tachycardia - On Metoprolol    Fibromyalgia 2018   GERD (gastroesophageal reflux disease)    History of hiatal hernia    small hernia found on scan   History of kidney stones    MIGRAINE, COMMON    Obesity, unspecified    OVARIAN CYST     Past Surgical History:  Procedure Laterality Date   HIP ARTHROSCOPY W/ LABRAL REPAIR Right 07/2023   KNEE ARTHROSCOPY Left 08/25/2022    MEDICATIONS:  ALPRAZolam  (XANAX ) 0.5 MG tablet   amphetamine -dextroamphetamine  (ADDERALL ) 20 MG tablet   cyanocobalamin  (VITAMIN B12) 1000 MCG/ML injection   EPINEPHrine  0.3 mg/0.3 mL IJ SOAJ injection   fexofenadine (ALLEGRA) 60 MG tablet   glucose blood (ONETOUCH VERIO) test strip   levonorgestrel  (MIRENA ) 20 MCG/24HR IUD   metFORMIN  (GLUCOPHAGE ) 1000 MG tablet   metoprolol  succinate (TOPROL -XL) 25 MG 24 hr tablet   omeprazole  (PRILOSEC) 40 MG capsule   ondansetron  (ZOFRAN -ODT) 4 MG disintegrating tablet   promethazine  (PHENERGAN ) 25 MG tablet   rizatriptan  (MAXALT ) 10 MG  tablet   rosuvastatin  (CRESTOR ) 5 MG tablet   Semaglutide , 1 MG/DOSE, (OZEMPIC , 1 MG/DOSE,) 4 MG/3ML SOPN   SYRINGE-NEEDLE, DISP, 3 ML 25G X 1 3 ML MISC   triamcinolone  cream (KENALOG ) 0.1 %   No current facility-administered medications for this encounter.    tranexamic acid  (CYKLOKAPRON ) 2,000 mg in sodium chloride  0.9 % 50 mL Topical Application    Isaiah Ruder, PA-C Surgical Short Stay/Anesthesiology [facility] Phone [phone] [facility] [place] Phone [phone] 03/30/2024 9:21 AM

## 2024-03-30 NOTE — Anesthesia Preprocedure Evaluation (Signed)
 Anesthesia Evaluation  Patient identified by MRN, date of birth, ID band Patient awake    Reviewed: Allergy & Precautions, H&P , NPO status , Patient's Chart, lab work & pertinent test results, reviewed documented beta blocker date and time   History of Anesthesia Complications (+) history of anesthetic complications (2024 hip scope had nystagmus postop? (no ketamine given))  Airway Mallampati: II  TM Distance: >3 FB Neck ROM: Full    Dental no notable dental hx.    Pulmonary neg pulmonary ROS, former smoker   Pulmonary exam normal breath sounds clear to auscultation       Cardiovascular Normal cardiovascular exam+ dysrhythmias (sinus tach- on metoprolol )  Rhythm:Regular Rate:Normal  cardiology evaluation by Dr. Verlin initially on 12/19/20 for tachycardia, dyspnea with occasional chest pressure. TSH was normal and had not noted much change when Adderall  held for a month.  Some improvement in dyspnea after an iron  infusion. He ordered long term monitor, echo, and exercise myocardial stress test. May 2022 monitor was considered overall normal with SR and very rare PVCs/PACs. 01/28/21 echo showed LVEF 65 to 70%, no regional wall motion abnormalities, normal RV systolic function, mild aortic valve calcification and sclerosis but no evidence of aortic stenosis or aortic regurgitation. 01/29/21 stress test was normal, low risk, EF 72%.  Continue Toprol  recommended with follow-up around 2 years. She says symptoms all resolved after starting metoprolol .   Neuro/Psych  Headaches PSYCHIATRIC DISORDERS Anxiety Depression       GI/Hepatic Neg liver ROS, hiatal hernia,GERD  Controlled,,  Endo/Other  diabetes, Well Controlled, Type 2, Insulin  Dependent  BMI 32 A1c 6.7, FS 145 this AM  Renal/GU negative Renal ROS  negative genitourinary   Musculoskeletal  (+) Arthritis , Osteoarthritis,  Fibromyalgia -  Abdominal   Peds negative pediatric  ROS (+)  Hematology negative hematology ROS (+) Hb 13.1, plt 348   Anesthesia Other Findings Ozempic  LD: 8/9  Reproductive/Obstetrics negative OB ROS                              Anesthesia Physical Anesthesia Plan  ASA: 3  Anesthesia Plan: Spinal, MAC and Regional   Post-op Pain Management: Tylenol  PO (pre-op)*, Toradol  IV (intra-op)* and Precedex   Induction:   PONV Risk Score and Plan: 2 and Propofol  infusion and TIVA  Airway Management Planned: Natural Airway and Nasal Cannula  Additional Equipment: None  Intra-op Plan:   Post-operative Plan:   Informed Consent: I have reviewed the patients History and Physical, chart, labs and discussed the procedure including the risks, benefits and alternatives for the proposed anesthesia with the patient or authorized representative who has indicated his/her understanding and acceptance.       Plan Discussed with: CRNA  Anesthesia Plan Comments: (   she reported prior history of waking up with propofol  and not remembering for a short time after prior knee surgery and having SOB and chest tightness after EGD in 2017 and short term nystagmus after 2024 hip surgery.      )         Anesthesia Quick Evaluation

## 2024-03-30 NOTE — Telephone Encounter (Signed)
 Order has been placed for Home Health to [facility] per patient request.

## 2024-03-31 ENCOUNTER — Other Ambulatory Visit: Payer: Self-pay

## 2024-03-31 ENCOUNTER — Ambulatory Visit (HOSPITAL_BASED_OUTPATIENT_CLINIC_OR_DEPARTMENT_OTHER): Admitting: Certified Registered Nurse Anesthetist

## 2024-03-31 ENCOUNTER — Encounter (HOSPITAL_COMMUNITY)
Admission: RE | Disposition: A | Discharge status: Home / Self Care | Payer: Self-pay | Source: Home / Self Care | Attending: Orthopedic Surgery

## 2024-03-31 ENCOUNTER — Encounter (HOSPITAL_COMMUNITY): Admitting: Vascular Surgery

## 2024-03-31 ENCOUNTER — Encounter (HOSPITAL_COMMUNITY): Payer: Self-pay | Admitting: Orthopedic Surgery

## 2024-03-31 ENCOUNTER — Observation Stay (HOSPITAL_COMMUNITY)
Admission: RE | Admit: 2024-03-31 | Discharge: 2024-04-01 | Disposition: A | Discharge status: Home / Self Care | Attending: Orthopedic Surgery | Admitting: Orthopedic Surgery

## 2024-03-31 DIAGNOSIS — Z7982 Long term (current) use of aspirin: Secondary | ICD-10-CM | POA: Diagnosis not present

## 2024-03-31 DIAGNOSIS — Z79899 Other long term (current) drug therapy: Secondary | ICD-10-CM | POA: Diagnosis not present

## 2024-03-31 DIAGNOSIS — F418 Other specified anxiety disorders: Secondary | ICD-10-CM

## 2024-03-31 DIAGNOSIS — Z87891 Personal history of nicotine dependence: Secondary | ICD-10-CM | POA: Diagnosis not present

## 2024-03-31 DIAGNOSIS — E66812 Obesity, class 2: Secondary | ICD-10-CM | POA: Diagnosis not present

## 2024-03-31 DIAGNOSIS — E119 Type 2 diabetes mellitus without complications: Secondary | ICD-10-CM

## 2024-03-31 DIAGNOSIS — Z7984 Long term (current) use of oral hypoglycemic drugs: Secondary | ICD-10-CM | POA: Diagnosis not present

## 2024-03-31 DIAGNOSIS — Z794 Long term (current) use of insulin: Secondary | ICD-10-CM

## 2024-03-31 DIAGNOSIS — M25562 Pain in left knee: Secondary | ICD-10-CM | POA: Diagnosis present

## 2024-03-31 DIAGNOSIS — M1712 Unilateral primary osteoarthritis, left knee: Secondary | ICD-10-CM

## 2024-03-31 DIAGNOSIS — I499 Cardiac arrhythmia, unspecified: Secondary | ICD-10-CM | POA: Insufficient documentation

## 2024-03-31 DIAGNOSIS — Z96652 Presence of left artificial knee joint: Secondary | ICD-10-CM

## 2024-03-31 DIAGNOSIS — Z01818 Encounter for other preprocedural examination: Principal | ICD-10-CM

## 2024-03-31 HISTORY — PX: TOTAL KNEE ARTHROPLASTY: SHX125

## 2024-03-31 LAB — GLUCOSE, CAPILLARY
Glucose-Capillary: 145 mg/dL — ABNORMAL HIGH (ref 70–99)
Glucose-Capillary: 151 mg/dL — ABNORMAL HIGH (ref 70–99)
Glucose-Capillary: 242 mg/dL — ABNORMAL HIGH (ref 70–99)
Glucose-Capillary: 168 mg/dL — ABNORMAL HIGH (ref 70–99)
Glucose-Capillary: 175 mg/dL — ABNORMAL HIGH (ref 70–99)

## 2024-03-31 LAB — POCT PREGNANCY, URINE: Preg Test, Ur: NEGATIVE

## 2024-03-31 SURGERY — ARTHROPLASTY, KNEE, TOTAL
Anesthesia: Monitor Anesthesia Care | Site: Knee | Laterality: Left

## 2024-03-31 MED ORDER — SODIUM CHLORIDE 0.9% FLUSH
INTRAVENOUS | Status: DC | PRN
  Administered 2024-03-31: 20 mL

## 2024-03-31 MED ORDER — PROPOFOL 10 MG/ML IV BOLUS
INTRAVENOUS | Status: AC
  Filled 2024-03-31: qty 20

## 2024-03-31 MED ORDER — CELECOXIB 100 MG PO CAPS
100.0000 mg | ORAL_CAPSULE | Freq: Two times a day (BID) | ORAL | Status: DC
Start: 2024-03-31 — End: 2024-04-01
  Administered 2024-03-31 – 2024-04-01 (×2): 100 mg via ORAL
  Filled 2024-03-31 (×2): qty 1

## 2024-03-31 MED ORDER — METHOCARBAMOL 1000 MG/10ML IJ SOLN: 500.0000 mg | Freq: Four times a day (QID) | INTRAMUSCULAR | Status: DC | PRN

## 2024-03-31 MED ORDER — IRRISEPT - 450ML BOTTLE WITH 0.05% CHG IN STERILE WATER, USP 99.95% OPTIME
TOPICAL | Status: DC | PRN
  Administered 2024-03-31: 450 mL

## 2024-03-31 MED ORDER — METHOCARBAMOL 500 MG PO TABS
500.0000 mg | ORAL_TABLET | Freq: Four times a day (QID) | ORAL | Status: DC | PRN
  Administered 2024-03-31 – 2024-04-01 (×3): 500 mg via ORAL
  Filled 2024-03-31 (×3): qty 1

## 2024-03-31 MED ORDER — KETOROLAC TROMETHAMINE 30 MG/ML IJ SOLN: 30.0000 mg | Freq: Once | INTRAMUSCULAR | Status: DC | PRN

## 2024-03-31 MED ORDER — HYDROMORPHONE HCL 1 MG/ML IJ SOLN
INTRAMUSCULAR | Status: AC
  Filled 2024-03-31: qty 0.5

## 2024-03-31 MED ORDER — DEXAMETHASONE SODIUM PHOSPHATE 10 MG/ML IJ SOLN
INTRAMUSCULAR | Status: DC | PRN
  Administered 2024-03-31: 5 mg via INTRAVENOUS

## 2024-03-31 MED ORDER — HYDROMORPHONE HCL 1 MG/ML IJ SOLN
INTRAMUSCULAR | Status: DC | PRN
  Administered 2024-03-31: .5 mg via INTRAVENOUS

## 2024-03-31 MED ORDER — POVIDONE-IODINE 10 % EX SWAB
2.0000 | Freq: Once | CUTANEOUS | Status: AC
  Administered 2024-03-31: 2 via TOPICAL

## 2024-03-31 MED ORDER — AMPHETAMINE-DEXTROAMPHETAMINE 10 MG PO TABS: 20.0000 mg | ORAL_TABLET | Freq: Two times a day (BID) | ORAL | Status: DC

## 2024-03-31 MED ORDER — ROPIVACAINE HCL 5 MG/ML IJ SOLN
INTRAMUSCULAR | Status: DC | PRN
  Administered 2024-03-31: 30 mL via PERINEURAL

## 2024-03-31 MED ORDER — MIDAZOLAM HCL 2 MG/2ML IJ SOLN
INTRAMUSCULAR | Status: AC
  Filled 2024-03-31: qty 2

## 2024-03-31 MED ORDER — VANCOMYCIN HCL 1000 MG IV SOLR
INTRAVENOUS | Status: AC
  Filled 2024-03-31: qty 20

## 2024-03-31 MED ORDER — POVIDONE-IODINE 10 % EX SWAB: 2.0000 | Freq: Once | CUTANEOUS | Status: DC

## 2024-03-31 MED ORDER — MORPHINE SULFATE (PF) 4 MG/ML IV SOLN
INTRAVENOUS | Status: DC | PRN
  Administered 2024-03-31: 4 mg via INTRAMUSCULAR

## 2024-03-31 MED ORDER — ACETAMINOPHEN 500 MG PO TABS
1000.0000 mg | ORAL_TABLET | Freq: Four times a day (QID) | ORAL | Status: AC
  Administered 2024-03-31 – 2024-04-01 (×4): 1000 mg via ORAL
  Filled 2024-03-31 (×4): qty 2

## 2024-03-31 MED ORDER — CHLORHEXIDINE GLUCONATE 0.12 % MT SOLN: 15.0000 mL | Freq: Once | OROMUCOSAL | Status: AC

## 2024-03-31 MED ORDER — VANCOMYCIN HCL 1000 MG IV SOLR
INTRAVENOUS | Status: DC | PRN
  Administered 2024-03-31: 1000 mg

## 2024-03-31 MED ORDER — ACETAMINOPHEN 325 MG PO TABS: 325.0000 mg | ORAL_TABLET | Freq: Four times a day (QID) | ORAL | Status: DC | PRN

## 2024-03-31 MED ORDER — DEXAMETHASONE SODIUM PHOSPHATE 10 MG/ML IJ SOLN
INTRAMUSCULAR | Status: DC | PRN
  Administered 2024-03-31: 10 mg

## 2024-03-31 MED ORDER — FENTANYL CITRATE (PF) 250 MCG/5ML IJ SOLN
INTRAMUSCULAR | Status: DC | PRN
  Administered 2024-03-31 (×2): 50 ug via INTRAVENOUS
  Administered 2024-03-31 (×2): 25 ug via INTRAVENOUS

## 2024-03-31 MED ORDER — AMISULPRIDE (ANTIEMETIC) 5 MG/2ML IV SOLN: 10.0000 mg | Freq: Once | INTRAVENOUS | Status: DC | PRN

## 2024-03-31 MED ORDER — TRANEXAMIC ACID 1000 MG/10ML IV SOLN
INTRAVENOUS | Status: DC | PRN
  Administered 2024-03-31: 2000 mg via TOPICAL

## 2024-03-31 MED ORDER — MENTHOL 3 MG MT LOZG: 1.0000 | LOZENGE | OROMUCOSAL | Status: DC | PRN

## 2024-03-31 MED ORDER — BUPIVACAINE HCL (PF) 0.25 % IJ SOLN
INTRAMUSCULAR | Status: AC
Start: 2024-03-31 — End: 2024-03-31
  Filled 2024-03-31: qty 30

## 2024-03-31 MED ORDER — PROPOFOL 500 MG/50ML IV EMUL
INTRAVENOUS | Status: DC | PRN
  Administered 2024-03-31: 20 mg via INTRAVENOUS
  Administered 2024-03-31: 85 ug/kg/min via INTRAVENOUS
  Administered 2024-03-31: 75 ug/kg/min via INTRAVENOUS
  Administered 2024-03-31: 20 mg via INTRAVENOUS

## 2024-03-31 MED ORDER — MEPERIDINE HCL 25 MG/ML IJ SOLN: 6.2500 mg | INTRAMUSCULAR | Status: DC | PRN

## 2024-03-31 MED ORDER — CEFAZOLIN SODIUM-DEXTROSE 2-4 GM/100ML-% IV SOLN
2.0000 g | INTRAVENOUS | Status: AC
  Administered 2024-03-31: 2 g via INTRAVENOUS
  Filled 2024-03-31: qty 100

## 2024-03-31 MED ORDER — PHENYLEPHRINE 80 MCG/ML (10ML) SYRINGE FOR IV PUSH (FOR BLOOD PRESSURE SUPPORT)
PREFILLED_SYRINGE | INTRAVENOUS | Status: DC | PRN
  Administered 2024-03-31: 80 ug via INTRAVENOUS
  Administered 2024-03-31 (×2): 40 ug via INTRAVENOUS

## 2024-03-31 MED ORDER — ONDANSETRON HCL 4 MG/2ML IJ SOLN: 4.0000 mg | Freq: Once | INTRAMUSCULAR | Status: DC | PRN

## 2024-03-31 MED ORDER — DOCUSATE SODIUM 100 MG PO CAPS
100.0000 mg | ORAL_CAPSULE | Freq: Two times a day (BID) | ORAL | Status: DC
  Administered 2024-03-31 – 2024-04-01 (×3): 100 mg via ORAL
  Filled 2024-03-31 (×3): qty 1

## 2024-03-31 MED ORDER — CHLORHEXIDINE GLUCONATE 0.12 % MT SOLN
OROMUCOSAL | Status: AC
  Administered 2024-03-31: 15 mL via OROMUCOSAL
  Filled 2024-03-31: qty 15

## 2024-03-31 MED ORDER — ONDANSETRON HCL 4 MG/2ML IJ SOLN
INTRAMUSCULAR | Status: DC | PRN
  Administered 2024-03-31: 4 mg via INTRAVENOUS

## 2024-03-31 MED ORDER — OXYCODONE HCL 5 MG PO TABS
5.0000 mg | ORAL_TABLET | ORAL | Status: DC | PRN
  Administered 2024-03-31 – 2024-04-01 (×4): 10 mg via ORAL
  Filled 2024-03-31 (×4): qty 2

## 2024-03-31 MED ORDER — HYDROMORPHONE HCL 1 MG/ML IJ SOLN: 0.2500 mg | INTRAMUSCULAR | Status: DC | PRN

## 2024-03-31 MED ORDER — LACTATED RINGERS IV SOLN: INTRAVENOUS | Status: DC

## 2024-03-31 MED ORDER — 0.9 % SODIUM CHLORIDE (POUR BTL) OPTIME
TOPICAL | Status: DC | PRN
  Administered 2024-03-31: 4000 mL

## 2024-03-31 MED ORDER — CEFAZOLIN SODIUM-DEXTROSE 2-4 GM/100ML-% IV SOLN
2.0000 g | Freq: Three times a day (TID) | INTRAVENOUS | Status: AC
Start: 2024-03-31 — End: 2024-04-01
  Administered 2024-03-31 (×2): 2 g via INTRAVENOUS
  Filled 2024-03-31 (×2): qty 100

## 2024-03-31 MED ORDER — CLONIDINE HCL (ANALGESIA) 100 MCG/ML EP SOLN
EPIDURAL | Status: DC | PRN
  Administered 2024-03-31: 1 mL via INTRA_ARTICULAR

## 2024-03-31 MED ORDER — CLONIDINE HCL (ANALGESIA) 100 MCG/ML EP SOLN
EPIDURAL | Status: AC
Start: 2024-03-31 — End: 2024-03-31
  Filled 2024-03-31: qty 10

## 2024-03-31 MED ORDER — MIDAZOLAM HCL 2 MG/2ML IJ SOLN
INTRAMUSCULAR | Status: DC | PRN
  Administered 2024-03-31: 2 mg via INTRAVENOUS

## 2024-03-31 MED ORDER — PANTOPRAZOLE SODIUM 40 MG PO TBEC
80.0000 mg | DELAYED_RELEASE_TABLET | Freq: Every day | ORAL | Status: DC
  Administered 2024-03-31 – 2024-04-01 (×2): 80 mg via ORAL
  Filled 2024-03-31 (×2): qty 2

## 2024-03-31 MED ORDER — KETOROLAC TROMETHAMINE 30 MG/ML IJ SOLN
INTRAMUSCULAR | Status: DC | PRN
Start: 2024-03-31 — End: 2024-03-31
  Administered 2024-03-31: 30 mg via INTRAVENOUS

## 2024-03-31 MED ORDER — METOCLOPRAMIDE HCL 5 MG/ML IJ SOLN: 5.0000 mg | Freq: Three times a day (TID) | INTRAMUSCULAR | Status: DC | PRN

## 2024-03-31 MED ORDER — SODIUM CHLORIDE 0.9 % IV SOLN: INTRAVENOUS | Status: AC

## 2024-03-31 MED ORDER — SODIUM CHLORIDE 0.9 % IR SOLN
Status: DC | PRN
  Administered 2024-03-31: 3000 mL

## 2024-03-31 MED ORDER — INSULIN ASPART 100 UNIT/ML IJ SOLN
0.0000 [IU] | Freq: Three times a day (TID) | INTRAMUSCULAR | Status: DC
  Administered 2024-03-31: 3 [IU] via SUBCUTANEOUS
  Administered 2024-03-31: 5 [IU] via SUBCUTANEOUS

## 2024-03-31 MED ORDER — PHENOL 1.4 % MT LIQD: 1.0000 | OROMUCOSAL | Status: DC | PRN

## 2024-03-31 MED ORDER — METFORMIN HCL 500 MG PO TABS
1000.0000 mg | ORAL_TABLET | Freq: Every day | ORAL | Status: DC
  Administered 2024-03-31: 1000 mg via ORAL
  Filled 2024-03-31: qty 2

## 2024-03-31 MED ORDER — TRANEXAMIC ACID-NACL 1000-0.7 MG/100ML-% IV SOLN
1000.0000 mg | INTRAVENOUS | Status: AC
  Administered 2024-03-31: 1000 mg via INTRAVENOUS
  Filled 2024-03-31: qty 100

## 2024-03-31 MED ORDER — OXYCODONE HCL 5 MG/5ML PO SOLN: 5.0000 mg | Freq: Once | ORAL | Status: DC | PRN

## 2024-03-31 MED ORDER — BUPIVACAINE LIPOSOME 1.3 % IJ SUSP
INTRAMUSCULAR | Status: DC | PRN
  Administered 2024-03-31: 20 mL

## 2024-03-31 MED ORDER — MORPHINE SULFATE (PF) 4 MG/ML IV SOLN
INTRAVENOUS | Status: AC
  Filled 2024-03-31: qty 2

## 2024-03-31 MED ORDER — POVIDONE-IODINE 7.5 % EX SOLN
Freq: Once | CUTANEOUS | Status: DC
  Filled 2024-03-31: qty 118

## 2024-03-31 MED ORDER — PHENYLEPHRINE HCL-NACL 20-0.9 MG/250ML-% IV SOLN
INTRAVENOUS | Status: DC | PRN
  Administered 2024-03-31: 20 ug/min via INTRAVENOUS

## 2024-03-31 MED ORDER — ONDANSETRON HCL 4 MG PO TABS: 4.0000 mg | ORAL_TABLET | Freq: Four times a day (QID) | ORAL | Status: DC | PRN

## 2024-03-31 MED ORDER — PHENYLEPHRINE 80 MCG/ML (10ML) SYRINGE FOR IV PUSH (FOR BLOOD PRESSURE SUPPORT)
PREFILLED_SYRINGE | INTRAVENOUS | Status: AC
  Filled 2024-03-31: qty 10

## 2024-03-31 MED ORDER — OXYCODONE HCL 5 MG PO TABS: 5.0000 mg | ORAL_TABLET | Freq: Once | ORAL | Status: DC | PRN

## 2024-03-31 MED ORDER — ONDANSETRON HCL 4 MG/2ML IJ SOLN
INTRAMUSCULAR | Status: AC
  Filled 2024-03-31: qty 2

## 2024-03-31 MED ORDER — HYDROMORPHONE HCL 1 MG/ML IJ SOLN: 0.5000 mg | INTRAMUSCULAR | Status: DC | PRN

## 2024-03-31 MED ORDER — BUPIVACAINE LIPOSOME 1.3 % IJ SUSP
INTRAMUSCULAR | Status: AC
  Filled 2024-03-31: qty 20

## 2024-03-31 MED ORDER — DEXAMETHASONE SODIUM PHOSPHATE 10 MG/ML IJ SOLN
INTRAMUSCULAR | Status: AC
Start: 2024-03-31 — End: 2024-03-31
  Filled 2024-03-31: qty 1

## 2024-03-31 MED ORDER — ASPIRIN 81 MG PO CHEW
81.0000 mg | CHEWABLE_TABLET | Freq: Two times a day (BID) | ORAL | Status: DC
  Administered 2024-03-31 – 2024-04-01 (×2): 81 mg via ORAL
  Filled 2024-03-31 (×2): qty 1

## 2024-03-31 MED ORDER — METOPROLOL SUCCINATE ER 25 MG PO TB24
25.0000 mg | ORAL_TABLET | Freq: Every day | ORAL | Status: DC
  Administered 2024-04-01: 25 mg via ORAL
  Filled 2024-03-31: qty 1

## 2024-03-31 MED ORDER — FENTANYL CITRATE (PF) 250 MCG/5ML IJ SOLN
INTRAMUSCULAR | Status: AC
  Filled 2024-03-31: qty 5

## 2024-03-31 MED ORDER — ACETAMINOPHEN 500 MG PO TABS
1000.0000 mg | ORAL_TABLET | Freq: Once | ORAL | Status: AC
  Administered 2024-03-31: 1000 mg via ORAL
  Filled 2024-03-31: qty 2

## 2024-03-31 MED ORDER — METOCLOPRAMIDE HCL 5 MG PO TABS: 5.0000 mg | ORAL_TABLET | Freq: Three times a day (TID) | ORAL | Status: DC | PRN

## 2024-03-31 MED ORDER — ORAL CARE MOUTH RINSE: 15.0000 mL | Freq: Once | OROMUCOSAL | Status: AC

## 2024-03-31 SURGICAL SUPPLY — 65 items
BAG COUNTER SPONGE SURGICOUNT (BAG) ×1 IMPLANT
BAG DECANTER FOR FLEXI CONT (MISCELLANEOUS) ×1 IMPLANT
BANDAGE ESMARK 6X9 LF (GAUZE/BANDAGES/DRESSINGS) ×1 IMPLANT
BLADE SAG 18X100X1.27 (BLADE) ×1 IMPLANT
BLADE SAW SAG 90X13X1.27 (BLADE) ×1 IMPLANT
BNDG COHESIVE 6X5 TAN ST LF (GAUZE/BANDAGES/DRESSINGS) ×1 IMPLANT
BNDG ELASTIC 6X15 VLCR STRL LF (GAUZE/BANDAGES/DRESSINGS) ×1 IMPLANT
BOWL SMART MIX CTS (DISPOSABLE) IMPLANT
CNTNR URN SCR LID CUP LEK RST (MISCELLANEOUS) ×1 IMPLANT
COMPONENT FEM PS KNEE NRW 6 LT (Joint) IMPLANT
COMPONENT MED PLY PERSS6-7 12 (Joint) IMPLANT
COMPONENT PATELLA PEG 3 32 (Joint) IMPLANT
COMPONET TIB PS KNEE C 0D LT (Joint) IMPLANT
COVER SURGICAL LIGHT HANDLE (MISCELLANEOUS) ×1 IMPLANT
CUFF TOURN SGL QUICK 42 (TOURNIQUET CUFF) IMPLANT
CUFF TRNQT CYL 34X4.125X (TOURNIQUET CUFF) ×1 IMPLANT
DRAPE INCISE IOBAN 66X45 STRL (DRAPES) IMPLANT
DRAPE SURG ORHT 6 SPLT 77X108 (DRAPES) ×3 IMPLANT
DRAPE U-SHAPE 47X51 STRL (DRAPES) ×1 IMPLANT
DRSG AQUACEL AG ADV 3.5X14 (GAUZE/BANDAGES/DRESSINGS) IMPLANT
DURAPREP 26ML APPLICATOR (WOUND CARE) ×2 IMPLANT
ELECT CAUTERY BLADE 6.4 (BLADE) ×1 IMPLANT
ELECTRODE REM PT RTRN 9FT ADLT (ELECTROSURGICAL) ×1 IMPLANT
GAUZE SPONGE 4X4 12PLY STRL (GAUZE/BANDAGES/DRESSINGS) ×1 IMPLANT
GLOVE BIOGEL PI IND STRL 6.5 (GLOVE) ×1 IMPLANT
GLOVE BIOGEL PI IND STRL 7.0 (GLOVE) ×1 IMPLANT
GLOVE BIOGEL PI IND STRL 8 (GLOVE) ×1 IMPLANT
GLOVE ECLIPSE 7.0 STRL STRAW (GLOVE) ×1 IMPLANT
GLOVE ECLIPSE 8.0 STRL XLNG CF (GLOVE) ×1 IMPLANT
GLOVE SURG ENC MOIS LTX SZ6.5 (GLOVE) ×3 IMPLANT
GOWN STRL REUS W/ TWL LRG LVL3 (GOWN DISPOSABLE) ×3 IMPLANT
HOOD PEEL AWAY T7 (MISCELLANEOUS) ×3 IMPLANT
IMMOBILIZER KNEE 20 THIGH 36 (SOFTGOODS) IMPLANT
IMMOBILIZER KNEE 22 UNIV (SOFTGOODS) IMPLANT
IMMOBILIZER KNEE 24 THIGH 36 (SOFTGOODS) IMPLANT
KIT BASIN OR (CUSTOM PROCEDURE TRAY) ×1 IMPLANT
KIT TURNOVER KIT B (KITS) ×1 IMPLANT
MANIFOLD NEPTUNE II (INSTRUMENTS) ×1 IMPLANT
NDL 22X1.5 STRL (OR ONLY) (MISCELLANEOUS) ×2 IMPLANT
NDL SPNL 18GX3.5 QUINCKE PK (NEEDLE) ×1 IMPLANT
NEEDLE 22X1.5 STRL (OR ONLY) (MISCELLANEOUS) ×2 IMPLANT
NEEDLE SPNL 18GX3.5 QUINCKE PK (NEEDLE) ×1 IMPLANT
NS IRRIG 1000ML POUR BTL (IV SOLUTION) ×2 IMPLANT
PACK TOTAL JOINT (CUSTOM PROCEDURE TRAY) ×1 IMPLANT
PAD ARMBOARD POSITIONER FOAM (MISCELLANEOUS) ×2 IMPLANT
PAD CAST 4YDX4 CTTN HI CHSV (CAST SUPPLIES) ×1 IMPLANT
PADDING CAST COTTON 6X4 STRL (CAST SUPPLIES) ×1 IMPLANT
PIN DRILL HDLS TROCAR 75 4PK (PIN) IMPLANT
SCREW FEMALE HEX FIX 25X2.5 (ORTHOPEDIC DISPOSABLE SUPPLIES) IMPLANT
SET HNDPC FAN SPRY TIP SCT (DISPOSABLE) ×1 IMPLANT
SPIKE FLUID TRANSFER (MISCELLANEOUS) ×1 IMPLANT
STRIP CLOSURE SKIN 1/2X4 (GAUZE/BANDAGES/DRESSINGS) ×2 IMPLANT
SUCTION TUBE FRAZIER 10FR DISP (SUCTIONS) ×1 IMPLANT
SUT MNCRL AB 3-0 PS2 18 (SUTURE) ×1 IMPLANT
SUT VIC AB 0 CT1 27XBRD ANBCTR (SUTURE) ×3 IMPLANT
SUT VIC AB 1 CT1 36 (SUTURE) ×5 IMPLANT
SUT VIC AB 2-0 CT2 27 (SUTURE) ×4 IMPLANT
SUT VICRYL 0 27 CT2 27 ABS (SUTURE) ×1 IMPLANT
SYR 30ML LL (SYRINGE) ×3 IMPLANT
SYR TB 1ML LUER SLIP (SYRINGE) ×1 IMPLANT
TOWEL GREEN STERILE (TOWEL DISPOSABLE) ×2 IMPLANT
TOWEL GREEN STERILE FF (TOWEL DISPOSABLE) ×2 IMPLANT
TRAY CATH INTERMITTENT SS 16FR (CATHETERS) IMPLANT
WATER STERILE IRR 1000ML POUR (IV SOLUTION) IMPLANT
YANKAUER SUCT BULB TIP NO VENT (SUCTIONS) ×1 IMPLANT

## 2024-03-31 NOTE — Progress Notes (Signed)
 Orthopedic Tech Progress Note Patient Details:  Lisa Duran 11/15/1980 [id number]  CPM Left Knee CPM Left Knee: On Left Knee Flexion (Degrees): 40 Left Knee Extension (Degrees): 10  Post Interventions Patient Tolerated: Well Instructions Provided: Care of device, Adjustment of device Ortho Devices Type of Ortho Device: CPM padding, Bone foam zero knee Ortho Device/Splint Location: LLE, bone foam is at bedside for when out of the CPM Ortho Device/Splint Interventions: Ordered, Application, Adjustment   Post Interventions Patient Tolerated: Well Instructions Provided: Care of device, Adjustment of device  Adriane Guglielmo Lisa Duran 03/31/2024, 4:05 PM

## 2024-03-31 NOTE — Anesthesia Procedure Notes (Signed)
 Anesthesia Regional Block: Adductor canal block   Pre-Anesthetic Checklist: , timeout performed,  Correct Patient, Correct Site, Correct Laterality,  Correct Procedure, Correct Position, site marked,  Risks and benefits discussed,  Surgical consent,  Pre-op evaluation,  At surgeon's request and post-op pain management  Laterality: Left  Prep: Maximum Sterile Barrier Precautions used, chloraprep       Needles:  Injection technique: Single-shot  Needle Type: Echogenic Stimulator Needle     Needle Length: 9cm  Needle Gauge: 22     Additional Needles:   Procedures:,,,, ultrasound used (permanent image in chart),,    Narrative:  Start time: 03/31/2024 7:00 AM End time: 03/31/2024 7:05 AM Injection made incrementally with aspirations every 5 mL.  Performed by: Personally  Anesthesiologist: Merla Almarie HERO, DO  Additional Notes: Monitors applied. No increased pain on injection. No increased resistance to injection. Injection made in 5cc increments. Good needle visualization. Patient tolerated procedure well.

## 2024-03-31 NOTE — Op Note (Signed)
 NAMEHYACINTH, MARCELLI MEDICAL RECORD NO: [id number] ACCOUNT NO: [id number] DATE OF BIRTH: Dec 05, 1980 FACILITY: [facility] LOCATION: [facility] PHYSICIAN: Cordella RAMAN. Addie, MD  Operative Report   DATE OF PROCEDURE: 03/31/2024  PREOPERATIVE DIAGNOSIS:  Left knee arthritis.  POSTOPERATIVE DIAGNOSIS:  Left knee arthritis.  PROCEDURE:  Left total knee replacement using Press-Fit Persona cruciate-retaining size left 6 narrow femur with a size C spiked keel press-fit 32-mm 3-peg 9-mm thickness press-fit patella with 12-mm highly cross-linked polyethylene medial congruent  polyethylene liner.  SURGEON ATTENDING:  Cordella RAMAN.  Addie, MD  ASSISTANT:  Herlene Calix, PA  INDICATIONS:  The patient is a 43 year old patient with left knee pain.  Failure of conservative management.  Presents for operative management after explanation of risks and benefits.  DESCRIPTION OF PROCEDURE:  The patient was brought to the operating room where a spinal anesthetic was induced.  Preoperative antibiotics administered.  Timeout was called.  Left leg prescrubbed with alcohol and Betadine  allowed to air dry, prepped with  DuraPrep solution and draped in a sterile manner.  The patient had about 2 degrees of hyperextension and about 125 of flexion.  Mild varus alignment.  After sterile prepping and draping, the operative field was covered with Ioban.  Timeout was called.   The leg was elevated and exsanguinated with Esmarch wrap.  Tourniquet was inflated.  Anterior approach to the knee was made.  Skin and subcutaneous tissue was sharply divided.  IrriSept solution was utilized.  Median parapatellar approach was then made  and marked with a #1 Vicryl suture.  Patella was everted.  IrriSept solution was utilized again here into the joint.  Fat pad partially removed.  Patella everted.  Knee flexed.  ACL released.  Anterior horn lateral meniscus released.  We also released  the lateral patellofemoral ligament as well as soft tissue from the  anterior distal femur.  Medial soft tissue dissection was performed proportional to the patient's mild varus deformity preoperatively.  Next, the Hohmann retractor and collateral  ligament retractor and posterior retractor were placed.  Intramedullary alignment was then used to make a cut of 10 mm off the least affected lateral tibial plateau.  Severe arthritis present in both the patellofemoral compartment and medial  compartments.  Bone quality was excellent.  The cuts were made.  Next, intramedullary alignment was used to cut the femur in 5 degrees of valgus.  10-mm cut was made.  This allowed a 10 and 12 spacer to fit in with about only 1-2 degrees of  hyperextension.  Femur was sized to a size 6.  Anterior, posterior, and chamfer cuts were made and 3 degrees of external rotation, which gave symmetric flexion and extension gaps at 12 mm.  The tibial baseplate was then placed with very good peripheral  rim fit obtained aligned with the medial third of the tibial tubercle.  Next, the trial components were placed after the anterior, posterior, and chamfer cuts were made on the femur.  A 12-mm spacer was placed.  Patella was then cut down from 23 to 13 mm  and a 3-peg patella trial was placed.  At this time, we did trialing with a 10-mm spacer, 11-mm spacer, and a 12-mm spacer.  The patient had good extension.  Slight lift-off which required a little bit of measured release of the PCL.  With this  performed, the patient had excellent stability and no lift-off with the 12-mm spacer.  Full range of motion from 1-2 degrees of hyperextension to full flexion with excellent  patellar tracking using no thumbs technique.  At this time, trial components  were removed after final preparations were made on both the femur and the tibia.  Three liters of irrigating solution was utilized.  Capsule was anesthetized using a combination of Marcaine , Exparel  and saline.  We then let TXA sit in the knee for 3  minutes along  with IrriSept solution.  That was removed and then the components were tapped into position with excellent press-fit obtained.  12-mm spacer was utilized.  Knee was taken through a range of motion and found to have same stability parameters  with good stability to varus and valgus stress at 0, 30, and 90 degrees with excellent tracking of the patella.  Tourniquet was released at this time.  Pouring irrigation utilized x3 liters after controlling bleeding points using electrocautery.  It  should also be noted that we irrigated out the tibial canal using IrriSept prior to implant placement and we also put in some vancomycin  powder into the tibial canal.  Next, the knee arthrotomy was closed over a bolster using #1 Vicryl suture.  Prior to  final arthrotomy closure, we irrigated the knee out again with IrriSept solution, suctioned that out, and then placed vancomycin  powder into the knee joint.  I then closed the arthrotomy with #1 Vicryl suture and injected the knee joint itself with  Marcaine , morphine , and clonidine  for postop pain relief.  Next, the incision was closed using 0 Vicryl suture, 2-0 Vicryl suture, and 3-0 Monocryl with Steri-Strips, Aquacel dressing, Ace wrap, and knee immobilizer placed.  The patient tolerated the  procedure well without immediate complications.  Luke's assistance was required at all times for retraction, opening and closing mobilization of tissue.  His assistance was of medical necessity.   PUS D: 03/31/2024 10:21:41 am T: 03/31/2024 10:55:00 am  JOB: [id number]/ [id number]

## 2024-03-31 NOTE — Evaluation (Signed)
 Physical Therapy Evaluation Patient Details Name: Lisa Duran MRN: [id number] DOB: 10-Jan-1981 Today's Date: 03/31/2024  History of Present Illness  Pt is a 43 y/o female admitted 8/21 for elective L TKA  PMHx:ADHD, DM2, fibromyalgia, arthritis L knee  Clinical Impression  Pt admitted with/for elective THA.  Currently, CGA level for all mobility.  Pt currently limited functionally due to the problems listed below.  (see problems list.)  Pt will benefit from PT to maximize function and safety to be able to get home safely with available assist .         If plan is discharge home, recommend the following: A little help with walking and/or transfers;A little help with bathing/dressing/bathroom;Assistance with cooking/housework;Help with stairs or ramp for entrance   Can travel by private vehicle        Equipment Recommendations Rolling walker (2 wheels) (if does not own one)  Recommendations for Other Services       Functional Status Assessment Patient has had a recent decline in their functional status and demonstrates the ability to make significant improvements in function in a reasonable and predictable amount of time.     Precautions / Restrictions Precautions Precautions: Knee Precaution Booklet Issued: Yes (comment) Recall of Precautions/Restrictions: Intact Required Braces or Orthoses: Knee Immobilizer - Left Knee Immobilizer - Left:  (needed today with epidural) Restrictions LLE Weight Bearing Per Provider Order: Weight bearing as tolerated      Mobility  Bed Mobility Overal bed mobility: Needs Assistance Bed Mobility: Supine to Sit     Supine to sit: Contact guard          Transfers Overall transfer level: Needs assistance   Transfers: Sit to/from Stand Sit to Stand: Contact guard assist, Supervision           General transfer comment: cues for technique then pt appropriate with UE's and technique    Ambulation/Gait Ambulation/Gait assistance: Min  assist, Contact guard assist Gait Distance (Feet): 75 Feet (with KI, side-stepping up/down bed without KI and min) Assistive device: Rolling walker (2 wheels) Gait Pattern/deviations: Step-to pattern   Gait velocity interpretation: <1.31 ft/sec, indicative of household ambulator   General Gait Details: safe with KI and CGA with step to pattern, unsteady knee without KI  Careers information officer     Tilt Bed    Modified Rankin (Stroke Patients Only)       Balance Overall balance assessment: Needs assistance Sitting-balance support: No upper extremity supported, Feet supported Sitting balance-Leahy Scale: Good     Standing balance support: Single extremity supported, Bilateral upper extremity supported, Reliant on assistive device for balance Standing balance-Leahy Scale: Fair                               Pertinent Vitals/Pain Pain Assessment Pain Assessment: Faces Faces Pain Scale: Hurts little more Pain Location: L knee Pain Descriptors / Indicators: Burning, Grimacing, Guarding Pain Intervention(s): Monitored during session, Repositioned    Home Living Family/patient expects to be discharged to:: Private residence Living Arrangements: Spouse/significant other Available Help at Discharge: Family Type of Home: House Home Access: Stairs to enter Entrance Stairs-Rails: Right;Left   Alternate Level Stairs-Number of Steps: flight Home Layout: Two level;Bed/bath upstairs Home Equipment:  (check to see if has RW/3 in 1)      Prior Function Prior Level of Function : Independent/Modified Independent;Driving  Extremity/Trunk Assessment   Upper Extremity Assessment Upper Extremity Assessment: Overall WFL for tasks assessed    Lower Extremity Assessment Lower Extremity Assessment:  (L knee numb from epidural needing KI to ambulate, knee flexion AAROM>=90degrees)    Cervical / Trunk Assessment Cervical /  Trunk Assessment: Normal  Communication   Communication Communication: No apparent difficulties    Cognition Arousal: Alert Behavior During Therapy: WFL for tasks assessed/performed   PT - Cognitive impairments: No apparent impairments                         Following commands: Intact       Cueing Cueing Techniques: Verbal cues, Tactile cues     General Comments      Exercises Total Joint Exercises Ankle Circles/Pumps: 10 reps Quad Sets: 5 reps, Supine Gluteal Sets: 5 reps, Supine Heel Slides: 10 reps, AAROM, Supine Hip ABduction/ADduction: 5 reps, AAROM, Supine, Left Straight Leg Raises: AAROM, Left, 5 reps, Supine Knee Flexion: AAROM, 5 reps, Seated (90 degrees)   Assessment/Plan    PT Assessment Patient needs continued PT services  PT Problem List Decreased strength;Decreased activity tolerance;Decreased mobility;Decreased knowledge of use of DME;Pain       PT Treatment Interventions DME instruction;Gait training;Stair training;Functional mobility training;Therapeutic activities;Patient/family education;Therapeutic exercise    PT Goals (Current goals can be found in the Care Plan section)  Acute Rehab PT Goals Patient Stated Goal: Independent without AD PT Goal Formulation: With patient Time For Goal Achievement: 04/02/24 Potential to Achieve Goals: Good    Frequency 7X/week     Co-evaluation               AM-PAC PT 6 Clicks Mobility  Outcome Measure Help needed turning from your back to your side while in a flat bed without using bedrails?: A Little Help needed moving from lying on your back to sitting on the side of a flat bed without using bedrails?: A Little Help needed moving to and from a bed to a chair (including a wheelchair)?: A Little Help needed standing up from a chair using your arms (e.g., wheelchair or bedside chair)?: A Little Help needed to walk in hospital room?: A Little Help needed climbing 3-5 steps with a railing?  : A Little 6 Click Score: 18    End of Session Equipment Utilized During Treatment: Left knee immobilizer Activity Tolerance: Patient tolerated treatment well Patient left: in bed;with call bell/phone within reach;with family/visitor present Nurse Communication: Mobility status PT Visit Diagnosis: Other abnormalities of gait and mobility (R26.89);Pain Pain - Right/Left: Left Pain - part of body: Knee    Time: 8385-8357 PT Time Calculation (min) (ACUTE ONLY): 28 min   Charges:   PT Evaluation $PT Eval Low Complexity: 1 Low PT Treatments $Gait Training: 8-22 mins PT General Charges $$ ACUTE PT VISIT: 1 Visit         03/31/2024  [place] HERO., PT Acute Rehabilitation Services [phone]  (office)  Vinie GAILS Burma Ketcher 03/31/2024, 5:00 PM

## 2024-03-31 NOTE — Anesthesia Procedure Notes (Signed)
 Spinal  Patient location during procedure: OR Start time: 03/31/2024 7:20 AM End time: 03/31/2024 7:22 AM Reason for block: surgical anesthesia Staffing Performed: anesthesiologist  Anesthesiologist: Merla Almarie HERO, DO Performed by: Merla Almarie HERO, DO Authorized by: Merla Almarie HERO, DO   Preanesthetic Checklist Completed: patient identified, IV checked, risks and benefits discussed, surgical consent, monitors and equipment checked, pre-op evaluation and timeout performed Spinal Block Patient position: sitting Prep: DuraPrep and site prepped and draped Patient monitoring: cardiac monitor, continuous pulse ox and blood pressure Approach: midline Location: L3-4 Injection technique: single-shot Needle Needle type: Pencan  Needle gauge: 24 G Needle length: 9 cm Assessment Sensory level: T6 Events: CSF return Additional Notes Functioning IV was confirmed and monitors were applied. Sterile prep and drape, including hand hygiene and sterile gloves were used. The patient was positioned and the spine was prepped. The skin was anesthetized with lidocaine .  Free flow of clear CSF was obtained prior to injecting local anesthetic into the CSF.  The spinal needle aspirated freely following injection.  The needle was carefully withdrawn.  The patient tolerated the procedure well.

## 2024-03-31 NOTE — H&P (Signed)
 TOTAL KNEE ADMISSION H&P  Patient is being admitted for left total knee arthroplasty.  Subjective:  Chief Complaint:left knee pain.  HPI: Lisa Duran, 43 y.o. female, has a history of pain and functional disability in the left knee due to arthritis and has failed non-surgical conservative treatments for greater than 12 weeks to includeNSAID's and/or analgesics, corticosteriod injections, flexibility and strengthening excercises, and activity modification.  Onset of symptoms was gradual, starting 5 years ago with gradually worsening course since that time. The patient noted prior meniscal root repair in the left knee with aggravation of sxs after labral repair. Now with global knee pain on the left knee(s).  Patient currently rates pain in the left knee(s) at 9 out of 10 with activity. Patient has night pain, worsening of pain with activity and weight bearing, pain that interferes with activities of daily living, pain with passive range of motion, crepitus, and joint swelling.  Patient has evidence of severe medial compartment arthritis on mri and xray by imaging studies. This patient has had no recent infections.. There is no active infection.  Patient Active Problem List   Diagnosis Date Noted   Osteoarthritis of left knee 11/20/2023   Tear of right acetabular labrum 07/21/2023   B12 deficiency 05/09/2021   Dyslipidemia 04/14/2019   Enteritis 01/12/2019   Sleep walking disorder 04/06/2017   Iron  deficiency anemia 03/27/2017   Fibromyalgia 02/03/2017   GERD (gastroesophageal reflux disease) 06/24/2015   Eczema 06/22/2015   Depression 06/22/2015   Tachycardia 06/16/2014   Food allergy    ADHD (attention deficit hyperactivity disorder) 01/27/2011   Anxiety 02/20/2010   Allergic rhinitis 11/21/2009   OVARIAN CYST 11/21/2009   Diabetes mellitus type 2 with neurological manifestations (HCC) 11/20/2009   Class 2 obesity in adult 11/20/2009   Migraine without aura 11/20/2009   Past Medical  History:  Diagnosis Date   ADHD (attention deficit hyperactivity disorder)    ALLERGIC RHINITIS    Allergy    Anemia    ANXIETY    Arthritis    left knee   Complication of anesthesia    2017 propofol  for EGD chest tightness, SOB. 2024 surgery had nystagmus after surgery   Depression    DIABETES MELLITUS, TYPE II    Dysrhythmia    Sinus Tachycardia - On Metoprolol    Fibromyalgia 2018   GERD (gastroesophageal reflux disease)    History of hiatal hernia    small hernia found on scan   History of kidney stones    MIGRAINE, COMMON    Obesity, unspecified    OVARIAN CYST     Past Surgical History:  Procedure Laterality Date   HIP ARTHROSCOPY W/ LABRAL REPAIR Right 07/2023   KNEE ARTHROSCOPY Left 08/25/2022    Current Facility-Administered Medications  Medication Dose Route Frequency Provider Last Rate Last Admin   acetaminophen  (TYLENOL ) tablet 1,000 mg  1,000 mg Oral Once Finucane, Elizabeth M, DO       ceFAZolin  (ANCEF ) IVPB 2g/100 mL premix  2 g Intravenous On Call to OR Magnant, Charles L, PA-C       chlorhexidine  (PERIDEX ) 0.12 % solution 15 mL  15 mL Mouth/Throat Once Finucane, Elizabeth M, DO       Or   Oral care mouth rinse  15 mL Mouth Rinse Once Merla Norris M, DO       chlorhexidine  (PERIDEX ) 0.12 % solution            lactated ringers  infusion   Intravenous Continuous Finucane, Elizabeth M,  DO       povidone-iodine  (BETADINE ) 7.5 % scrub   Topical Once Magnant, Charles L, PA-C       povidone-iodine  10 % swab 2 Application  2 Application Topical Once Magnant, Charles L, PA-C       povidone-iodine  10 % swab 2 Application  2 Application Topical Once Magnant, Charles L, PA-C       tranexamic acid  (CYKLOKAPRON ) 2,000 mg in sodium chloride  0.9 % 50 mL Topical Application  2,000 mg Topical To OR Magnant, Charles L, PA-C       tranexamic acid  (CYKLOKAPRON ) IVPB 1,000 mg  1,000 mg Intravenous To OR Magnant, Charles L, PA-C       Allergies  Allergen Reactions    Chicken Allergy Anaphylaxis and Shortness Of Breath   Covid-19 Mrna Vacc (Moderna) Anaphylaxis   Other Itching, Swelling and Other (See Comments)    NUTS (of ANY kind): Lips and mouth swell, but no breathing impairment & migraines and eczema are triggered   Propofol  Shortness Of Breath    SOB, chest tightness, wheezing after EGD on 2017  Received propofol  for surgery 07/21/23 without complication   Basaglar  Kwikpen [Insulin  Glargine] Palpitations and Hypertension   Adhesive [Tape] Other (See Comments)    Makes the skin VERY RED    Gluten Meal Diarrhea   Peanut-Containing Drug Products Itching, Swelling and Other (See Comments)    Lips and mouth swell, but no breathing impairment & migraines and eczema are triggered    Novolog  [Insulin  Aspart (Human Analog) (Yeast)] Rash   Tresiba  [Insulin  Degludec] Rash    eczema   Victoza  [Liraglutide ] Rash    Social History   Tobacco Use   Smoking status: Former    Current packs/day: 0.00    Types: Cigarettes    Quit date: 08/20/2002    Years since quitting: 21.6   Smokeless tobacco: Never   Tobacco comments:    single, separated from spouse 02/2010.   Substance Use Topics   Alcohol use: Yes    Comment: maybe one drink per month    Family History  Problem Relation Age of Onset   Diabetes Mother    Hyperlipidemia Mother    CAD Mother    CVA Mother    Diabetes Father    Hyperlipidemia Father    Coronary artery disease Father    Hypertension Father    Cancer Father        esoph   Stroke Father    Cancer Maternal Grandfather 66       Prostate and Lung   Diabetes Sister        Gestational   Graves' disease Maternal Aunt    Cancer Maternal Uncle        Brain   Stroke Paternal Grandmother    Heart disease Paternal Grandmother    Stroke Paternal Grandfather    Heart disease Paternal Grandfather    Diabetes Sister        Gestational and Type II   Hashimoto's thyroiditis Cousin    Cancer Maternal Uncle        Brain     Review  of Systems  Musculoskeletal:  Positive for arthralgias.  All other systems reviewed and are negative.   Objective:  Physical Exam Vitals reviewed.  HENT:     Head: Normocephalic.     Nose: Nose normal.     Mouth/Throat:     Mouth: Mucous membranes are moist.  Eyes:     Pupils: Pupils are equal, round,  and reactive to light.  Cardiovascular:     Rate and Rhythm: Normal rate.     Pulses: Normal pulses.  Pulmonary:     Effort: Pulmonary effort is normal.  Abdominal:     General: Abdomen is flat.  Musculoskeletal:     Cervical back: Normal range of motion.  Skin:    General: Skin is warm.     Capillary Refill: Capillary refill takes less than 2 seconds.  Neurological:     General: No focal deficit present.     Mental Status: She is alert.  Psychiatric:        Mood and Affect: Mood normal.     Ortho exam demonstrates no effusion in the left knee.  Has both medial and lateral joint line tenderness.  Range of motion is about 3-1 20.  Pedal pulses palpable on the left and right inside.  No nerve root tension signs on the right.  Not too much groin pain with internal and external rotation of the leg.  Has good hip flexion abduction abduction strength.  The definite paresthesias L1 S1 bilaterally.  Some pain with forward and lateral bending.  No masses lymphadenopathy or skin changes noted in the back region.  Vital signs in last 24 hours: Temp:  [98.8 F (37.1 C)] (P) 98.8 F (37.1 C) (08/21 0605) Pulse Rate:  [87] (P) 87 (08/21 0605) Resp:  [16] (P) 16 (08/21 0605) BP: (P) 111/76 (08/21 0605) SpO2:  [96 %] (P) 96 % (08/21 0605) Weight:  [85.7 kg] (P) 85.7 kg (08/21 0605)  Labs:   Estimated body mass index is 32.44 kg/m (pended) as calculated from the following:   Height as of this encounter: (P) 5' 4 (1.626 m).   Weight as of this encounter: (P) 85.7 kg.   Imaging Review Plain radiographs demonstrate moderate degenerative joint disease of the left knee(s). The overall  alignment isneutral. The bone quality appears to be good for age and reported activity level.      Assessment/Plan:  End stage arthritis, left knee   The patient history, physical examination, clinical judgment of the provider and imaging studies are consistent with end stage degenerative joint disease of the left knee(s) and total knee arthroplasty is deemed medically necessary. The treatment options including medical management, injection therapy arthroscopy and arthroplasty were discussed at length. The risks and benefits of total knee arthroplasty were presented and reviewed. The risks due to aseptic loosening, infection, stiffness, patella tracking problems, thromboembolic complications and other imponderables were discussed. The patient acknowledged the explanation, agreed to proceed with the plan and consent was signed. Patient is being admitted for inpatient treatment for surgery, pain control, PT, OT, prophylactic antibiotics, VTE prophylaxis, progressive ambulation and ADL's and discharge planning. The patient is planning to be discharged home with home health services Mri lsp negative for radicular problem on right     Patient's anticipated LOS is less than 2 midnights, meeting these requirements: - Younger than 67 - Lives within 1 hour of care - Has a competent adult at home to recover with post-op recover - NO history of  - Chronic pain requiring opiods  - Diabetes  - Coronary Artery Disease  - Heart failure  - Heart attack  - Stroke  - DVT/VTE  - Cardiac arrhythmia  - Respiratory Failure/COPD  - Renal failure  - Anemia  - Advanced Liver disease

## 2024-03-31 NOTE — Anesthesia Postprocedure Evaluation (Signed)
 Anesthesia Post Note  Patient: Jemma Rasp  Procedure(s) Performed: ARTHROPLASTY, KNEE, TOTAL (Left: Knee)     Patient location during evaluation: PACU Anesthesia Type: Regional, MAC and Spinal Level of consciousness: awake and alert Pain management: pain level controlled Vital Signs Assessment: post-procedure vital signs reviewed and stable Respiratory status: spontaneous breathing, nonlabored ventilation and respiratory function stable Cardiovascular status: blood pressure returned to baseline and stable Postop Assessment: no apparent nausea or vomiting, no headache, no backache and spinal receding Anesthetic complications: no   No notable events documented.  Last Vitals:  Vitals:   03/31/24 1115 03/31/24 1144  BP: 96/60 115/79  Pulse: 78 81  Resp: 12 20  Temp:  37.1 C  SpO2: 95% 96%    Last Pain:  Vitals:   03/31/24 1150  TempSrc:   PainSc: 0-No pain                 Almarie CHRISTELLA Marchi

## 2024-03-31 NOTE — Brief Op Note (Signed)
   03/31/2024  10:14 AM  PATIENT:  Lisa Duran  43 y.o. female  PRE-OPERATIVE DIAGNOSIS:  left knee osteoarthritis  POST-OPERATIVE DIAGNOSIS:  left knee osteoarthritis  PROCEDURE:  Procedure(s): ARTHROPLASTY, KNEE, TOTAL  SURGEON:  Surgeon(s): Addie, Cordella Hamilton, MD  ASSISTANT: Magnant PA  ANESTHESIA:   spinal  EBL: 75 ml    Total I/O In: 1300 [I.V.:1100; IV Piggyback:200] Out: 1675 [Urine:1600; Blood:75]  BLOOD ADMINISTERED: none  DRAINS: none   LOCAL MEDICATIONS USED: Marcaine  morphine  clonidine  Exparel  vancomycin  powder  SPECIMEN:  No Specimen  COUNTS:  YES  TOURNIQUET:   Total Tourniquet Time Documented: Thigh (Left) - 80 minutes Total: Thigh (Left) - 80 minutes   DICTATION: [id number]  PLAN OF CARE: Admit for overnight observation  PATIENT DISPOSITION:  PACU - hemodynamically stable

## 2024-03-31 NOTE — Transfer of Care (Signed)
 Immediate Anesthesia Transfer of Care Note  Patient: Lisa Duran  Procedure(s) Performed: ARTHROPLASTY, KNEE, TOTAL (Left: Knee)  Patient Location: PACU  Anesthesia Type:Spinal  Level of Consciousness: awake, alert , oriented, and patient cooperative  Airway & Oxygen Therapy: Patient Spontanous Breathing and Patient connected to face mask oxygen  Post-op Assessment: Report given to RN and Post -op Vital signs reviewed and stable  Post vital signs: Reviewed and stable  Last Vitals:  Vitals Value Taken Time  BP 86/65 03/31/24 10:15  Temp    Pulse 93 03/31/24 10:17  Resp 16 03/31/24 10:17  SpO2 91 % 03/31/24 10:17  Vitals shown include unfiled device data.  Last Pain:  Vitals:   03/31/24 0630  TempSrc:   PainSc: 3       Patients Stated Pain Goal: 4 (03/31/24 0630)  Complications: No notable events documented.

## 2024-04-01 ENCOUNTER — Encounter (HOSPITAL_COMMUNITY): Payer: Self-pay | Admitting: Orthopedic Surgery

## 2024-04-01 DIAGNOSIS — M1712 Unilateral primary osteoarthritis, left knee: Secondary | ICD-10-CM | POA: Diagnosis not present

## 2024-04-01 LAB — GLUCOSE, CAPILLARY: Glucose-Capillary: 170 mg/dL — ABNORMAL HIGH (ref 70–99)

## 2024-04-01 MED ORDER — CELECOXIB 100 MG PO CAPS: 100.0000 mg | ORAL_CAPSULE | Freq: Two times a day (BID) | ORAL | 1 refills | Status: DC

## 2024-04-01 MED ORDER — DIPHENHYDRAMINE HCL 25 MG PO CAPS: 25.0000 mg | ORAL_CAPSULE | Freq: Four times a day (QID) | ORAL | Status: DC | PRN

## 2024-04-01 MED ORDER — OXYCODONE HCL 5 MG PO TABS: 5.0000 mg | ORAL_TABLET | ORAL | 0 refills | Status: DC | PRN

## 2024-04-01 MED ORDER — DOCUSATE SODIUM 100 MG PO CAPS: 100.0000 mg | ORAL_CAPSULE | Freq: Two times a day (BID) | ORAL | 0 refills | Status: DC

## 2024-04-01 MED ORDER — METHOCARBAMOL 500 MG PO TABS: 500.0000 mg | ORAL_TABLET | Freq: Three times a day (TID) | ORAL | 1 refills | Status: DC | PRN

## 2024-04-01 MED ORDER — ASPIRIN 81 MG PO CHEW: 81.0000 mg | CHEWABLE_TABLET | Freq: Two times a day (BID) | ORAL | 0 refills | Status: DC

## 2024-04-01 NOTE — Discharge Summary (Signed)
 Physician Discharge Summary      Patient ID: Lisa Duran MRN: [id number] DOB/AGE: 1981/02/07 43 y.o.  Admit date: 03/31/2024 Discharge date: 04/01/2024  Admission Diagnoses:  Principal Problem:   Status post left knee replacement   Discharge Diagnoses:  Same  Surgeries: Procedure(s): ARTHROPLASTY, KNEE, TOTAL on 03/31/2024   Consultants:   Discharged Condition: Stable  Hospital Course: Lisa Duran is an 43 y.o. female who was admitted 03/31/2024 with a chief complaint of left knee pain, and found to have a diagnosis of left knee arthritis.  They were brought to the operating room on 03/31/2024 and underwent the above named procedures.  Pt awoke from anesthesia without complication and was transferred to the floor. On POD1, patient's pain was controlled.  She was able to mobilize well with physical therapy.  She had no red flag signs or symptoms throughout her stay.  Felt comfortable with discharge home on POD 1.  She was discharged and she will f/u with Dr. Addie in clinic in ~2 weeks.   Antibiotics given:  Anti-infectives (From admission, onward)    Start     Dose/Rate Route Frequency Ordered Stop   03/31/24 1530  ceFAZolin  (ANCEF ) IVPB 2g/100 mL premix        2 g 200 mL/hr over 30 Minutes Intravenous Every 8 hours 03/31/24 1149 03/31/24 2128   03/31/24 0818  vancomycin  (VANCOCIN ) powder  Status:  Discontinued          As needed 03/31/24 0818 03/31/24 1011   03/31/24 0600  ceFAZolin  (ANCEF ) IVPB 2g/100 mL premix        2 g 200 mL/hr over 30 Minutes Intravenous On call to O.R. 03/31/24 9446 03/31/24 0759     .  Recent vital signs:  Vitals:   03/31/24 2242 04/01/24 0435  BP: 95/65 92/74  Pulse: 86 88  Resp: 18 18  Temp: 97.8 F (36.6 C) 97.6 F (36.4 C)  SpO2: 96% 98%    Recent laboratory studies:  Results for orders placed or performed during the hospital encounter of 03/31/24  Glucose, capillary   Collection Time: 03/31/24  6:07 AM  Result Value Ref Range    Glucose-Capillary 145 (H) 70 - 99 mg/dL   Comment 1 Notify RN   Pregnancy, urine POC   Collection Time: 03/31/24  6:35 AM  Result Value Ref Range   Preg Test, Ur NEGATIVE NEGATIVE  Glucose, capillary   Collection Time: 03/31/24 10:14 AM  Result Value Ref Range   Glucose-Capillary 151 (H) 70 - 99 mg/dL  Glucose, capillary   Collection Time: 03/31/24  1:16 PM  Result Value Ref Range   Glucose-Capillary 242 (H) 70 - 99 mg/dL  Glucose, capillary   Collection Time: 03/31/24  4:43 PM  Result Value Ref Range   Glucose-Capillary 168 (H) 70 - 99 mg/dL  Glucose, capillary   Collection Time: 03/31/24  9:01 PM  Result Value Ref Range   Glucose-Capillary 175 (H) 70 - 99 mg/dL   Comment 1 Notify RN    Comment 2 Document in Chart   Glucose, capillary   Collection Time: 04/01/24  6:27 AM  Result Value Ref Range   Glucose-Capillary 170 (H) 70 - 99 mg/dL   Comment 1 Notify RN    Comment 2 Document in Chart     Discharge Medications:   Allergies as of 04/01/2024       Reactions   Chicken Allergy Anaphylaxis, Shortness Of Breath   Covid-19 Mrna Vacc (moderna) Anaphylaxis   Other Itching,  Swelling, Other (See Comments)   NUTS (of ANY kind): Lips and mouth swell, but no breathing impairment & migraines and eczema are triggered   Propofol  Shortness Of Breath   SOB, chest tightness, wheezing after EGD on 2017 Received propofol  for surgery 07/21/23 without complication   Basaglar  Kwikpen [insulin  Glargine] Palpitations, Hypertension   Adhesive [tape] Other (See Comments)   Makes the skin VERY RED   Gluten Meal Diarrhea   Peanut-containing Drug Products Itching, Swelling, Other (See Comments)   Lips and mouth swell, but no breathing impairment & migraines and eczema are triggered   Novolog  [insulin  Aspart (human Analog) (yeast)] Rash   Tresiba  [insulin  Degludec] Rash   eczema   Victoza  [liraglutide ] Rash        Medication List     TAKE these medications    ALPRAZolam  0.5 MG  tablet Commonly known as: XANAX  TAKE 1 TABLET(0.5 MG) BY MOUTH THREE TIMES DAILY AS NEEDED FOR ANXIETY   amphetamine -dextroamphetamine  20 MG tablet Commonly known as: ADDERALL  Take 1 tablet (20 mg total) by mouth 2 (two) times daily.   aspirin  81 MG chewable tablet Chew 1 tablet (81 mg total) by mouth 2 (two) times daily.   celecoxib  100 MG capsule Commonly known as: CELEBREX  Take 1 capsule (100 mg total) by mouth 2 (two) times daily.   cyanocobalamin  1000 MCG/ML injection Commonly known as: VITAMIN B12 INJECT INTRAMUSCULARLY 1 ML  EVERY 30 DAYS (DISCARD 28 DAYS  AFTER FIRST USE)   docusate sodium  100 MG capsule Commonly known as: COLACE Take 1 capsule (100 mg total) by mouth 2 (two) times daily.   EPINEPHrine  0.3 mg/0.3 mL Soaj injection Commonly known as: EPI-PEN Use as needed for hypersensitivity reaction   fexofenadine 60 MG tablet Commonly known as: ALLEGRA Take 60 mg by mouth daily as needed (Allergies).   glucose blood test strip Commonly known as: OneTouch Verio Use 2x a day   levonorgestrel  20 MCG/24HR IUD Commonly known as: MIRENA  1 Intra Uterine Device (1 each total) by Intrauterine route once.   metFORMIN  1000 MG tablet Commonly known as: GLUCOPHAGE  TAKE 2 TABLETS BY MOUTH DAILY WITH SUPPER.   methocarbamol  500 MG tablet Commonly known as: ROBAXIN  Take 1 tablet (500 mg total) by mouth every 8 (eight) hours as needed for muscle spasms.   metoprolol  succinate 25 MG 24 hr tablet Commonly known as: TOPROL -XL TAKE 1 TABLET BY MOUTH DAILY   omeprazole  40 MG capsule Commonly known as: PRILOSEC TAKE 1 CAPSULE BY MOUTH DAILY BEFORE SUPPER   ondansetron  4 MG disintegrating tablet Commonly known as: ZOFRAN -ODT Take 1 tablet (4 mg total) by mouth every 8 (eight) hours as needed for nausea or vomiting.   oxyCODONE  5 MG immediate release tablet Commonly known as: Oxy IR/ROXICODONE  Take 1-2 tablets (5-10 mg total) by mouth every 4 (four) hours as needed for  moderate pain (pain score 4-6) (pain score 4-6).   Ozempic  (1 MG/DOSE) 4 MG/3ML Sopn Generic drug: Semaglutide  (1 MG/DOSE) Inject 1 mg into the skin once a week.   promethazine  25 MG tablet Commonly known as: PHENERGAN  Take 1 tablet (25 mg total) by mouth every 6 (six) hours as needed for nausea.   rizatriptan  10 MG tablet Commonly known as: Maxalt  Take 1 tablet (10 mg total) by mouth as needed for migraine. May repeat in 2 hours if needed   rosuvastatin  5 MG tablet Commonly known as: CRESTOR  TAKE 1 TABLET(5 MG) BY MOUTH 2 TIMES A WEEK   SYRINGE-NEEDLE (DISP) 3 ML  25G X 1 3 ML Misc Use for monthly B12 injections   triamcinolone  cream 0.1 % Commonly known as: KENALOG  Apply 1 application topically 2 (two) times daily as needed (for itching).        Diagnostic Studies: No results found.  Disposition: Discharge disposition: 01-Home or Self Care       Discharge Instructions     Call MD / Call 911   Complete by: As directed    If you experience chest pain or shortness of breath, CALL 911 and be transported to the hospital emergency room.  If you develope a fever above 101 F, pus (white drainage) or increased drainage or redness at the wound, or calf pain, call your surgeon's office.   Constipation Prevention   Complete by: As directed    Drink plenty of fluids.  Prune juice may be helpful.  You may use a stool softener, such as Colace (over the counter) 100 mg twice a day.  Use MiraLax (over the counter) for constipation as needed.   Diet - low sodium heart healthy   Complete by: As directed    Discharge instructions   Complete by: As directed    You may shower, dressing is waterproof.  Do not remove the dressing, we will remove it at your first post-op appointment.  Do not take a bath or soak the knee in a tub or pool.  You may weightbear as you can tolerate on the operative leg with a walker.  Continue using the CPM machine 3 times per day for one hour each time,  increasing the degrees of range of motion daily; you can max out the range of motion.  Use the blue cradle boot under your heel to work on getting your leg straight.  Do NOT put a pillow under your knee.  You will follow-up with Dr. Addie in the clinic in 2 weeks at your given appointment date.  Call the office with any urgent concerns at [phone].  He may send us  a message on MyChart with any nonurgent concerns or questions you may have.  INSTRUCTIONS AFTER JOINT REPLACEMENT   Remove items at home which could result in a fall. This includes throw rugs or furniture in walking pathways ICE to the affected joint every three hours while awake for 30 minutes at a time, for at least the first 3-5 days, and then as needed for pain and swelling.  Continue to use ice for pain and swelling. You may notice swelling that will progress down to the foot and ankle.  This is normal after surgery.  Elevate your leg when you are not up walking on it.   Continue to use the breathing machine you got in the hospital (incentive spirometer) which will help keep your temperature down.  It is common for your temperature to cycle up and down following surgery, especially at night when you are not up moving around and exerting yourself.  The breathing machine keeps your lungs expanded and your temperature down.   DIET:  As you were doing prior to hospitalization, we recommend a well-balanced diet.  DRESSING / WOUND CARE / SHOWERING  Keep the surgical dressing until follow up.  The dressing is water  proof, so you can shower without any extra covering.  IF THE DRESSING FALLS OFF or the wound gets wet inside, change the dressing with sterile gauze.  Please use good hand washing techniques before changing the dressing.  Do not use any lotions or creams on the  incision until instructed by your surgeon.    ACTIVITY  Increase activity slowly as tolerated, but follow the weight bearing instructions below.   No driving for 6  weeks or until further direction given by your physician.  You cannot drive while taking narcotics.  No lifting or carrying greater than 10 lbs. until further directed by your surgeon. Avoid periods of inactivity such as sitting longer than an hour when not asleep. This helps prevent blood clots.  You may return to work once you are authorized by your doctor.     WEIGHT BEARING   Weight bearing as tolerated with assist device (walker, cane, etc) as directed, use it as long as suggested by your surgeon or therapist, typically at least 4-6 weeks.   EXERCISES  Results after joint replacement surgery are often greatly improved when you follow the exercise, range of motion and muscle strengthening exercises prescribed by your doctor. Safety measures are also important to protect the joint from further injury. Any time any of these exercises cause you to have increased pain or swelling, decrease what you are doing until you are comfortable again and then slowly increase them. If you have problems or questions, call your caregiver or physical therapist for advice.   Rehabilitation is important following a joint replacement. After just a few days of immobilization, the muscles of the leg can become weakened and shrink (atrophy).  These exercises are designed to build up the tone and strength of the thigh and leg muscles and to improve motion. Often times heat used for twenty to thirty minutes before working out will loosen up your tissues and help with improving the range of motion but do not use heat for the first two weeks following surgery (sometimes heat can increase post-operative swelling).   These exercises can be done on a training (exercise) mat, on the floor, on a table or on a bed. Use whatever works the best and is most comfortable for you.    Use music or television while you are exercising so that the exercises are a pleasant break in your day. This will make your life better with the  exercises acting as a break in your routine that you can look forward to.   Perform all exercises about fifteen times, three times per day or as directed.  You should exercise both the operative leg and the other leg as well.  Exercises include:   Quad Sets - Tighten up the muscle on the front of the thigh (Quad) and hold for 5-10 seconds.   Straight Leg Raises - With your knee straight (if you were given a brace, keep it on), lift the leg to 60 degrees, hold for 3 seconds, and slowly lower the leg.  Perform this exercise against resistance later as your leg gets stronger.  Leg Slides: Lying on your back, slowly slide your foot toward your buttocks, bending your knee up off the floor (only go as far as is comfortable). Then slowly slide your foot back down until your leg is flat on the floor again.  Angel Wings: Lying on your back spread your legs to the side as far apart as you can without causing discomfort.  Hamstring Strength:  Lying on your back, push your heel against the floor with your leg straight by tightening up the muscles of your buttocks.  Repeat, but this time bend your knee to a comfortable angle, and push your heel against the floor.  You may put a pillow under  the heel to make it more comfortable if necessary.   A rehabilitation program following joint replacement surgery can speed recovery and prevent re-injury in the future due to weakened muscles. Contact your doctor or a physical therapist for more information on knee rehabilitation.    CONSTIPATION  Constipation is defined medically as fewer than three stools per week and severe constipation as less than one stool per week.  Even if you have a regular bowel pattern at home, your normal regimen is likely to be disrupted due to multiple reasons following surgery.  Combination of anesthesia, postoperative narcotics, change in appetite and fluid intake all can affect your bowels.   YOU MUST use at least one of the following  options; they are listed in order of increasing strength to get the job done.  They are all available over the counter, and you may need to use some, POSSIBLY even all of these options:    Drink plenty of fluids (prune juice may be helpful) and high fiber foods Colace 100 mg by mouth twice a day  Senokot for constipation as directed and as needed Dulcolax (bisacodyl), take with full glass of water   Miralax (polyethylene glycol) once or twice a day as needed.  If you have tried all these things and are unable to have a bowel movement in the first 3-4 days after surgery call either your surgeon or your primary doctor.    If you experience loose stools or diarrhea, hold the medications until you stool forms back up.  If your symptoms do not get better within 1 week or if they get worse, check with your doctor.  If you experience the worst abdominal pain ever or develop nausea or vomiting, please contact the office immediately for further recommendations for treatment.   ITCHING:  If you experience itching with your medications, try taking only a single pain pill, or even half a pain pill at a time.  You can also use Benadryl  over the counter for itching or also to help with sleep.   TED HOSE STOCKINGS:  Use stockings on both legs until for at least 2 weeks or as directed by physician office. They may be removed at night for sleeping.  MEDICATIONS:  See your medication summary on the After Visit Summary that nursing will review with you.  You may have some home medications which will be placed on hold until you complete the course of blood thinner medication.  It is important for you to complete the blood thinner medication as prescribed.  PRECAUTIONS:  If you experience chest pain or shortness of breath - call 911 immediately for transfer to the hospital emergency department.   If you develop a fever greater that 101 F, purulent drainage from wound, increased redness or drainage from wound, foul  odor from the wound/dressing, or calf pain - CONTACT YOUR SURGEON.                                                   FOLLOW-UP APPOINTMENTS:  If you do not already have a post-op appointment, please call the office for an appointment to be seen by your surgeon.  Guidelines for how soon to be seen are listed in your After Visit Summary, but are typically between 1-4 weeks after surgery.  OTHER INSTRUCTIONS:   Knee Replacement:  Do not place  pillow under knee, focus on keeping the knee straight while resting. CPM instructions: 0-90 degrees, 2 hours in the morning, 2 hours in the afternoon, and 2 hours in the evening. Place foam block, curve side up under heel at all times except when in CPM or when walking.  DO NOT modify, tear, cut, or change the foam block in any way.  POST-OPERATIVE OPIOID TAPER INSTRUCTIONS: It is important to wean off of your opioid medication as soon as possible. If you do not need pain medication after your surgery it is ok to stop day one. Opioids include: Codeine, Hydrocodone (Norco, Vicodin), Oxycodone (Percocet, oxycontin ) and hydromorphone  amongst others.  Long term and even short term use of opiods can cause: Increased pain response Dependence Constipation Depression Respiratory depression And more.  Withdrawal symptoms can include Flu like symptoms Nausea, vomiting And more Techniques to manage these symptoms Hydrate well Eat regular healthy meals Stay active Use relaxation techniques(deep breathing, meditating, yoga) Do Not substitute Alcohol to help with tapering If you have been on opioids for less than two weeks and do not have pain than it is ok to stop all together.  Plan to wean off of opioids This plan should start within one week post op of your joint replacement. Maintain the same interval or time between taking each dose and first decrease the dose.  Cut the total daily intake of opioids by one tablet each day Next start to increase the time  between doses. The last dose that should be eliminated is the evening dose.   MAKE SURE YOU:  Understand these instructions.  Get help right away if you are not doing well or get worse.    Thank you for letting us  be a part of your medical care team.  It is a privilege we respect greatly.  We hope these instructions will help you stay on track for a fast and full recovery!    Dental Antibiotics:  In most cases prophylactic antibiotics for Dental procdeures after total joint surgery are not necessary.  Exceptions are as follows:  1. History of prior total joint infection  2. Severely immunocompromised (Organ Transplant, cancer chemotherapy, Rheumatoid biologic meds such as Humera)  3. Poorly controlled diabetes (A1C &gt; 8.0, blood glucose over 200)  If you have one of these conditions, contact your surgeon for an antibiotic prescription, prior to your dental procedure.   Increase activity slowly as tolerated   Complete by: As directed    Post-operative opioid taper instructions:   Complete by: As directed    POST-OPERATIVE OPIOID TAPER INSTRUCTIONS: It is important to wean off of your opioid medication as soon as possible. If you do not need pain medication after your surgery it is ok to stop day one. Opioids include: Codeine, Hydrocodone (Norco, Vicodin), Oxycodone (Percocet, oxycontin ) and hydromorphone  amongst others.  Long term and even short term use of opiods can cause: Increased pain response Dependence Constipation Depression Respiratory depression And more.  Withdrawal symptoms can include Flu like symptoms Nausea, vomiting And more Techniques to manage these symptoms Hydrate well Eat regular healthy meals Stay active Use relaxation techniques(deep breathing, meditating, yoga) Do Not substitute Alcohol to help with tapering If you have been on opioids for less than two weeks and do not have pain than it is ok to stop all together.  Plan to wean off of  opioids This plan should start within one week post op of your joint replacement. Maintain the same interval or time between taking each dose and first  decrease the dose.  Cut the total daily intake of opioids by one tablet each day Next start to increase the time between doses. The last dose that should be eliminated is the evening dose.             SignedBETHA Herlene Calix 04/01/2024, 8:03 AM

## 2024-04-01 NOTE — Progress Notes (Signed)
  Subjective: Lisa Duran is a 43 y.o. female s/p left TKA.  They are POD 1.  Pt's pain is controlled.  Pt denies any complain of chest pain, shortness of breath, abdominal pain, calf pain.  Patient denies any fevers or chills.  She has been ambulatory around the unit without any lightheadedness or dizziness.  She was able to walk all the way down to the double doors at the end of the unit that is approximately 100 feet down the hall.  Objective: Vital signs in last 24 hours: Temp:  [97.5 F (36.4 C)-98.8 F (37.1 C)] 97.6 F (36.4 C) (08/22 0435) Pulse Rate:  [70-88] 88 (08/22 0435) Resp:  [11-20] 18 (08/22 0435) BP: (86-115)/(59-79) 92/74 (08/22 0435) SpO2:  [93 %-98 %] 98 % (08/22 0435)  Intake/Output from previous day: 08/21 0701 - 08/22 0700 In: 1880 [P.O.:480; I.V.:1100; IV Piggyback:300] Out: 1675 [Urine:1600; Blood:75] Intake/Output this shift: No intake/output data recorded.  Exam:  No gross blood or drainage overlying the dressing 2+ DP pulse Sensation intact distally in the operative foot Able to dorsiflex and plantarflex the operative foot No calf tenderness.  Negative Homans' sign. Able to perform straight leg raise   Labs: Recent Labs    03/29/24 0900  HGB 13.1   Recent Labs    03/29/24 0900  WBC 8.6  RBC 5.13*  HCT 42.0  PLT 348   Recent Labs    03/29/24 0900  NA 135  K 3.6  CL 100  CO2 24  BUN 6  CREATININE 0.69  GLUCOSE 110*  CALCIUM  8.9   No results for input(s): LABPT, INR in the last 72 hours.  Assessment/Plan: Pt is POD 1 s/p TKA.    -Plan to discharge to home likely today or tomorrow pending patient's pain and PT eval  -WBAT with a walker  -Follow-up with Dr. Addie in clinic 2 weeks postoperatively    [facility] 04/01/2024, 7:55 AM

## 2024-04-01 NOTE — Progress Notes (Signed)
 Patient alert and oriented, mae's well, voiding adequate amount of urine, swallowing without difficulty, no c/o pain at time of discharge. Patient discharged home with husband. Script and discharged instructions given to patient. Patient and husband stated understanding of instructions given. Room was checked and accounted for all patient's belongings; discharge instructions concerning her medications, incision care, follow up appointment and when to call the doctor as needed were all discussed with patient by RN and She expressed understanding on the instructions given.

## 2024-04-01 NOTE — Progress Notes (Signed)
 Physical Therapy Treatment Patient Details Name: Lisa Duran MRN: [id number] DOB: 1981/05/19 Today's Date: 04/01/2024   History of Present Illness Pt is a 43 y/o female admitted 8/21 for elective L TKA  PMHx:ADHD, DM2, fibromyalgia, arthritis L knee    PT Comments  Pt made good progress towards her physical therapy goals during her inpatient stay. Pt with improved quad activation today; able to perform SLR with no lag. Reviewed HEP, appropriate DME use, cryotherapy, car transfer technique, and activity recommendations. Pt ambulating household distances with a RW, utilizing a step to pattern. Able to negotiate 3 steps with railing, however, did have some left knee instability with ascension. Discussed staying on main level initially and working with HHPT further on gait training; pt and pt spouse in agreement. No further acute PT needs.    If plan is discharge home, recommend the following: Assistance with cooking/housework;Help with stairs or ramp for entrance;A little help with bathing/dressing/bathroom   Can travel by private vehicle        Equipment Recommendations  None recommended by PT (pt equipped)    Recommendations for Other Services       Precautions / Restrictions Precautions Precautions: Knee Precaution Booklet Issued: Yes (comment) Recall of Precautions/Restrictions: Intact Restrictions Weight Bearing Restrictions Per Provider Order: Yes LLE Weight Bearing Per Provider Order: Weight bearing as tolerated     Mobility  Bed Mobility Overal bed mobility: Modified Independent             General bed mobility comments: Pt hooking RLE underneath L to elevate back into bed    Transfers Overall transfer level: Modified independent Equipment used: Rolling walker (2 wheels)               General transfer comment: Pt self cueing for appropriate technique    Ambulation/Gait Ambulation/Gait assistance: Supervision Gait Distance (Feet): 100 Feet Assistive  device: Rolling walker (2 wheels) Gait Pattern/deviations: Step-to pattern       General Gait Details: Moderate reliance through arms on walker, good proximity and posture throughout. Knee instability when attempting step through pattern, but good control with step to pattern.   Stairs Stairs: Yes Stairs assistance: Contact guard assist, Min assist Stair Management: One rail Left, Sideways Number of Stairs: 3 General stair comments: Knee instability with ascension on 3rd step, verbal cues for sequencing/technique   Wheelchair Mobility     Tilt Bed    Modified Rankin (Stroke Patients Only)       Balance Overall balance assessment: Needs assistance Sitting-balance support: No upper extremity supported, Feet supported Sitting balance-Leahy Scale: Good     Standing balance support: No upper extremity supported Standing balance-Leahy Scale: Fair Standing balance comment: With static standing                            Communication Communication Communication: No apparent difficulties  Cognition Arousal: Alert Behavior During Therapy: WFL for tasks assessed/performed   PT - Cognitive impairments: No apparent impairments                         Following commands: Intact      Cueing Cueing Techniques: Verbal cues  Exercises Total Joint Exercises Quad Sets: 5 reps, Seated Heel Slides: 10 reps, AROM, Supine, Left Hip ABduction/ADduction: Supine, Left, AROM, 10 reps Straight Leg Raises: Left, 5 reps, Supine, AROM Long Arc Quad: AROM, Left, 5 reps, Seated Knee Flexion: AAROM, 5 reps, Seated Goniometric  ROM: 0-90 degrees    General Comments        Pertinent Vitals/Pain Pain Assessment Pain Assessment: Faces Faces Pain Scale: Hurts little more Pain Location: L knee Pain Descriptors / Indicators: Grimacing, Guarding Pain Intervention(s): Limited activity within patient's tolerance, Monitored during session, Premedicated before session     Home Living                          Prior Function            PT Goals (current goals can now be found in the care plan section) Acute Rehab PT Goals Patient Stated Goal: Independent without AD PT Goal Formulation: With patient Time For Goal Achievement: 04/02/24 Potential to Achieve Goals: Good Progress towards PT goals: Progressing toward goals    Frequency    7X/week      PT Plan      Co-evaluation              AM-PAC PT 6 Clicks Mobility   Outcome Measure  Help needed turning from your back to your side while in a flat bed without using bedrails?: None Help needed moving from lying on your back to sitting on the side of a flat bed without using bedrails?: None Help needed moving to and from a bed to a chair (including a wheelchair)?: None Help needed standing up from a chair using your arms (e.g., wheelchair or bedside chair)?: None Help needed to walk in hospital room?: A Little Help needed climbing 3-5 steps with a railing? : A Little 6 Click Score: 22    End of Session Equipment Utilized During Treatment: Gait belt Activity Tolerance: Patient tolerated treatment well Patient left: in bed;with call bell/phone within reach;with family/visitor present Nurse Communication: Mobility status PT Visit Diagnosis: Other abnormalities of gait and mobility (R26.89);Pain Pain - Right/Left: Left Pain - part of body: Knee     Time: 0851-0928 PT Time Calculation (min) (ACUTE ONLY): 37 min  Charges:    $Gait Training: 8-22 mins $Therapeutic Exercise: 8-22 mins PT General Charges $$ ACUTE PT VISIT: 1 Visit                     Aleck Duran, PT, DPT Acute Rehabilitation Services Office [phone]    Lisa Duran 04/01/2024, 10:08 AM

## 2024-04-01 NOTE — Evaluation (Signed)
 Occupational Therapy Evaluation Patient Details Name: Lisa Duran MRN: [id number] DOB: [birth year] Today's Date: 04/01/2024   History of Present Illness   Pt is a 43 y/o female admitted 8/21 for elective L TKA  PMHx:ADHD, DM2, fibromyalgia, arthritis L knee     Clinical Impressions Lisa Duran was evaluated s/p the above admission list. She is indep at baseline. Upon evaluation, pt demonstrated mod I ability to complete mobility and ADLs. Generalized supervision provided throughout for safety. Pt was able to reach bilateral feet in sitting without AE to complete LB ADLs. Pt does not require further acute, or follow up OT services. Recommend discharge back to pt's environment with assist as needed. OT to sign off with appreciation of order, please re-consult if needed.       If plan is discharge home, recommend the following:   A little help with bathing/dressing/bathroom;Assist for transportation;Help with stairs or ramp for entrance;Assistance with cooking/housework     Functional Status Assessment   Patient has had a recent decline in their functional status and demonstrates the ability to make significant improvements in function in a reasonable and predictable amount of time.       Precautions/Restrictions   Precautions Precautions: Knee Precaution Booklet Issued: Yes (comment) Recall of Precautions/Restrictions: Intact Restrictions Weight Bearing Restrictions Per Provider Order: Yes LLE Weight Bearing Per Provider Order: Weight bearing as tolerated     Mobility Bed Mobility                    Transfers Overall transfer level: Modified independent                 General transfer comment: generalized supervision A provided for safety      Balance Overall balance assessment: Needs assistance Sitting-balance support: No upper extremity supported, Feet supported Sitting balance-Leahy Scale: Good     Standing balance support: No upper extremity  supported Standing balance-Leahy Scale: Fair Standing balance comment: With static standing                           ADL either performed or assessed with clinical judgement   ADL Overall ADL's : Needs assistance/impaired Eating/Feeding: Independent   Grooming: Supervision/safety   Upper Body Bathing: Independent   Lower Body Bathing: Supervison/ safety   Upper Body Dressing : Modified independent   Lower Body Dressing: Supervision/safety   Toilet Transfer: Supervision/safety   Toileting- Clothing Manipulation and Hygiene: Modified independent       Functional mobility during ADLs: Supervision/safety;Rolling walker (2 wheels) General ADL Comments: cues for compensatory techniques for LB ADLs, pt is able to reach BLEs without AE     Vision Baseline Vision/History: 1 Wears glasses Vision Assessment?: No apparent visual deficits     Perception Perception: Not tested       Praxis Praxis: Not tested       Pertinent Vitals/Pain Pain Assessment Pain Assessment: Faces Faces Pain Scale: Hurts little more Pain Location: L knee Pain Descriptors / Indicators: Grimacing, Guarding Pain Intervention(s): Limited activity within patient's tolerance, Monitored during session     Extremity/Trunk Assessment Upper Extremity Assessment Upper Extremity Assessment: Overall WFL for tasks assessed   Lower Extremity Assessment Lower Extremity Assessment: Defer to PT evaluation   Cervical / Trunk Assessment Cervical / Trunk Assessment: Normal   Communication Communication Communication: No apparent difficulties   Cognition Arousal: Alert Behavior During Therapy: WFL for tasks assessed/performed Cognition: No apparent impairments  Following commands: Intact       Cueing  General Comments   Cueing Techniques: Verbal cues      Exercises     Shoulder Instructions      Home Living Family/patient expects to be  discharged to:: Private residence Living Arrangements: Spouse/significant other Available Help at Discharge: Family Type of Home: House Home Access: Stairs to enter   Entrance Stairs-Rails: Right;Left Home Layout: Two level;Bed/bath upstairs Alternate Level Stairs-Number of Steps: flight Alternate Level Stairs-Rails: Right;Left           Home Equipment: Shower seat          Prior Functioning/Environment Prior Level of Function : Independent/Modified Independent;Driving             Mobility Comments: indep ADLs Comments: indep    OT Problem List: Decreased strength;Decreased range of motion;Decreased activity tolerance;Impaired balance (sitting and/or standing)        OT Goals(Current goals can be found in the care plan section)   Acute Rehab OT Goals Patient Stated Goal: home OT Goal Formulation: With patient Time For Goal Achievement: 04/15/24 Potential to Achieve Goals: Good   AM-PAC OT 6 Clicks Daily Activity     Outcome Measure Help from another person eating meals?: None Help from another person taking care of personal grooming?: A Little Help from another person toileting, which includes using toliet, bedpan, or urinal?: A Little Help from another person bathing (including washing, rinsing, drying)?: A Little Help from another person to put on and taking off regular upper body clothing?: A Little Help from another person to put on and taking off regular lower body clothing?: A Little 6 Click Score: 19   End of Session Equipment Utilized During Treatment: Rolling walker (2 wheels) CPM Left Knee CPM Left Knee: Off Nurse Communication: Mobility status  Activity Tolerance: Patient tolerated treatment well Patient left: in chair;with call bell/phone within reach  OT Visit Diagnosis: Unsteadiness on feet (R26.81);Other abnormalities of gait and mobility (R26.89);Muscle weakness (generalized) (M62.81);Pain                Time: 1050-1106 OT Time  Calculation (min): 16 min Charges:  OT General Charges $OT Visit: 1 Visit OT Evaluation $OT Eval Low Complexity: 1 Low  Lucie Kendall, OTR/L Acute Rehabilitation Services Office [phone] Secure Chat Communication Preferred   Lucie JONETTA Kendall 04/01/2024, 11:17 AM

## 2024-04-01 NOTE — TOC Transition Note (Signed)
 Transition of Care [facility]) - Discharge Note   Patient Details  Name: Maclaine Ahola MRN: [id number] Date of Birth: [birth year]  Transition of Care [facility]) CM/SW Contact:  Corean JAYSON Canary, RN Phone Number: 04/01/2024, 10:09 AM   Clinical Narrative:     Patient will transition home today. She is set up with [facility] for home health PT.  DME per floor.    Final next level of care: Home w Home Health Services Barriers to Discharge: No Barriers Identified   Patient Goals and CMS Choice            Discharge Placement                       Discharge Plan and Services Additional resources added to the After Visit Summary for                            [facility] Arranged: PT HH Agency: [facility] Date [facility] Agency Contacted: 04/01/24 Time HH Agency Contacted: [id number] Representative spoke with at [facility] Agency: Channing Ee  Social Drivers of Health (SDOH) Interventions SDOH Screenings   Food Insecurity: No Food Insecurity (11/17/2023)  Housing: Low Risk  (11/17/2023)  Transportation Needs: No Transportation Needs (11/17/2023)  Alcohol Screen: Low Risk  (11/17/2023)  Depression (PHQ2-9): Low Risk  (11/20/2023)  Financial Resource Strain: Low Risk  (11/17/2023)  Physical Activity: Sufficiently Active (11/17/2023)  Social Connections: Moderately Integrated (11/17/2023)  Stress: Stress Concern Present (11/17/2023)  Tobacco Use: Medium Risk (03/31/2024)     Readmission Risk Interventions     No data to display

## 2024-04-02 ENCOUNTER — Encounter: Payer: Self-pay | Admitting: Orthopedic Surgery

## 2024-04-04 ENCOUNTER — Other Ambulatory Visit: Payer: Self-pay | Admitting: Surgical

## 2024-04-04 MED ORDER — OXYCODONE HCL 5 MG PO TABS
5.0000 mg | ORAL_TABLET | ORAL | 0 refills | Status: DC | PRN
Start: 2024-04-04 — End: 2024-04-09

## 2024-04-06 ENCOUNTER — Encounter: Payer: Self-pay | Admitting: Orthopedic Surgery

## 2024-04-07 NOTE — Telephone Encounter (Signed)
 Try bottle of mag citrate pls calal thx

## 2024-04-08 ENCOUNTER — Telehealth: Payer: Self-pay | Admitting: Orthopedic Surgery

## 2024-04-08 NOTE — Telephone Encounter (Signed)
 Pt called stating she need refill of oxycodone . She states she will be out before the weekend is out. Please send to [address] Pt phone number is [phone]. Please send if possible ASAP

## 2024-04-09 ENCOUNTER — Other Ambulatory Visit: Payer: Self-pay | Admitting: Orthopedic Surgery

## 2024-04-09 MED ORDER — OXYCODONE HCL 5 MG PO TABS: 5.0000 mg | ORAL_TABLET | ORAL | 0 refills | Status: DC | PRN

## 2024-04-09 NOTE — Telephone Encounter (Signed)
 sent

## 2024-04-13 ENCOUNTER — Telehealth: Payer: Self-pay | Admitting: Orthopedic Surgery

## 2024-04-13 ENCOUNTER — Other Ambulatory Visit: Payer: Self-pay

## 2024-04-13 DIAGNOSIS — Z96659 Presence of unspecified artificial knee joint: Secondary | ICD-10-CM

## 2024-04-13 NOTE — Telephone Encounter (Signed)
 Okay for outpatient PT with no restrictions on range of motion or strengthening.  3 times a week for 4 weeks to focus mostly on range of motion thx

## 2024-04-13 NOTE — Telephone Encounter (Signed)
 Burnard from [facility]. They will DC home PT today. Would like a referral for outpatient PT sent to [facility].

## 2024-04-13 NOTE — Telephone Encounter (Signed)
Outpatient referral placed 

## 2024-04-15 ENCOUNTER — Encounter: Payer: Self-pay | Admitting: Orthopedic Surgery

## 2024-04-15 ENCOUNTER — Other Ambulatory Visit (INDEPENDENT_AMBULATORY_CARE_PROVIDER_SITE_OTHER)

## 2024-04-15 ENCOUNTER — Ambulatory Visit (INDEPENDENT_AMBULATORY_CARE_PROVIDER_SITE_OTHER): Admitting: Orthopedic Surgery

## 2024-04-15 DIAGNOSIS — Z96659 Presence of unspecified artificial knee joint: Secondary | ICD-10-CM

## 2024-04-15 MED ORDER — METHOCARBAMOL 500 MG PO TABS: 500.0000 mg | ORAL_TABLET | Freq: Three times a day (TID) | ORAL | 0 refills | Status: DC | PRN

## 2024-04-15 MED ORDER — HYDROCODONE-ACETAMINOPHEN 5-325 MG PO TABS: 1.0000 | ORAL_TABLET | Freq: Three times a day (TID) | ORAL | 0 refills | Status: DC | PRN

## 2024-04-15 NOTE — Therapy (Signed)
 OUTPATIENT PHYSICAL THERAPY LOWER EXTREMITY EVALUATION   Patient Name: Lisa Duran MRN: [id number] DOB:February 21, 1981, 43 y.o., female Today's Date: 04/18/2024  END OF SESSION:  PT End of Session - 04/18/24 1618     Visit Number 1    Date for PT Re-Evaluation 07/11/24    Authorization Type [employer]    PT Start Time 1618    PT Stop Time 1700    PT Time Calculation (min) 42 min          Past Medical History:  Diagnosis Date   ADHD (attention deficit hyperactivity disorder)    ALLERGIC RHINITIS    Allergy    Anemia    ANXIETY    Arthritis    left knee   Complication of anesthesia    2017 propofol  for EGD chest tightness, SOB. 2024 surgery had nystagmus after surgery   Depression    DIABETES MELLITUS, TYPE II    Dysrhythmia    Sinus Tachycardia - On Metoprolol    Fibromyalgia 2018   GERD (gastroesophageal reflux disease)    History of hiatal hernia    small hernia found on scan   History of kidney stones    MIGRAINE, COMMON    Obesity, unspecified    OVARIAN CYST    Past Surgical History:  Procedure Laterality Date   HIP ARTHROSCOPY W/ LABRAL REPAIR Right 07/2023   KNEE ARTHROSCOPY Left 08/25/2022   TOTAL KNEE ARTHROPLASTY Left 03/31/2024   Procedure: ARTHROPLASTY, KNEE, TOTAL;  Surgeon: Addie Cordella Hamilton, MD;  Location: [facility] OR;  Service: Orthopedics;  Laterality: Left;   Patient Active Problem List   Diagnosis Date Noted   Status post left knee replacement 03/31/2024   Arthritis of left knee 11/20/2023   Tear of right acetabular labrum 07/21/2023   B12 deficiency 05/09/2021   Dyslipidemia 04/14/2019   Enteritis 01/12/2019   Sleep walking disorder 04/06/2017   Iron  deficiency anemia 03/27/2017   Fibromyalgia 02/03/2017   GERD (gastroesophageal reflux disease) 06/24/2015   Eczema 06/22/2015   Depression 06/22/2015   Tachycardia 06/16/2014   Food allergy    ADHD (attention deficit hyperactivity disorder) 01/27/2011   Anxiety 02/20/2010   Allergic  rhinitis 11/21/2009   OVARIAN CYST 11/21/2009   Diabetes mellitus type 2 with neurological manifestations (HCC) 11/20/2009   Class 2 obesity in adult 11/20/2009   Migraine without aura 11/20/2009    PCP: Glade Hope  REFERRING PROVIDER: Cordella Addie  REFERRING DIAG:  [phone] (ICD-10-CM) - History of total knee replacement, unspecified laterality    THERAPY DIAG:  S/P total knee arthroplasty, left  Stiffness of left knee, not elsewhere classified  Acute pain of left knee  Localized edema  Difficulty in walking, not elsewhere classified  Muscle weakness (generalized)  Rationale for Evaluation and Treatment: Rehabilitation  ONSET DATE: 03/31/24  SUBJECTIVE:   SUBJECTIVE STATEMENT: I was here last year for meniscus arthroscopy, then in December I had the hip labral repair. Now I am back to work on the knee again.   PERTINENT HISTORY: R Hip arthroscopy with labral repair 07/2023 L TKA 03/31/24   PAIN:  Are you having pain? Yes: NPRS scale: 4-5/10, worse at end of day Pain location: calf pain, L knee Pain description: achy Aggravating factors: walking, standing  Relieving factors: icing  PRECAUTIONS: Knee  RED FLAGS: None   WEIGHT BEARING RESTRICTIONS: No  FALLS:  Has patient fallen in last 6 months? No  LIVING ENVIRONMENT: Lives with: lives with their spouse Lives in: House/apartment Stairs: Yes: Internal:  14 steps; on right going up and on left going up Has following equipment at home: Single point cane  OCCUPATION: office manager   PLOF: Independent and Independent with gait  PATIENT GOALS: get back to PLOF and long hikes with 100lb dog   NEXT MD VISIT: 05/31/24  OBJECTIVE:  Note: Objective measures were completed at Evaluation unless otherwise noted.  DIAGNOSTIC FINDINGS: AP lateral radiographs left knee reviewed. Total knee prosthesis in good  position alignment with no complicating features    COGNITION: Overall cognitive status: Within  functional limits for tasks assessed     SENSATION: WFL, numb around the scar   EDEMA:  Swelling around L knee but pt reports it is significantly better   MUSCLE LENGTH: Some tightness in HS and calves   POSTURE: No Significant postural limitations  PALPATION: Some TTP and warm, scar has good healing   LOWER EXTREMITY ROM:  Active ROM Right eval Left eval  Hip flexion    Hip extension    Hip abduction    Hip adduction    Hip internal rotation    Hip external rotation    Knee flexion  100  Knee extension  -3  Ankle dorsiflexion    Ankle plantarflexion    Ankle inversion    Ankle eversion     (Blank rows = not tested)  LOWER EXTREMITY MMT:  MMT Right eval Left eval  Hip flexion  4+  Hip extension    Hip abduction    Hip adduction    Hip internal rotation    Hip external rotation    Knee flexion  3  Knee extension  3  Ankle dorsiflexion    Ankle plantarflexion    Ankle inversion    Ankle eversion     (Blank rows = not tested)  FUNCTIONAL TESTS:  5 times sit to stand: 16s Timed up and go (TUG): 13s w/SPC, 14s w/o  GAIT: Distance walked: in clinic distances Assistive device utilized: Single point cane Level of assistance: Modified independence Comments: antalgic gait, using SPC                                                                                                                                TREATMENT DATE: 04/18/24- EVAL, HEP    PATIENT EDUCATION:  Education details: POC, HEP review Person educated: Patient Education method: Explanation Education comprehension: verbalized understanding  HOME EXERCISE PROGRAM: Quad set, heel slides, SLR with ER and IR, LAQ, stair stretch, standing HS curl, heel raises, STS   ASSESSMENT:  CLINICAL IMPRESSION: Patient is a 43 y.o. female who was seen today for physical therapy evaluation and treatment for L TKA on 03/31/24. She is using the CPM 2-3x a day for about an hour. She is not going over 100d  on it. Patient presents with weakness in her quads and hamstrings. She has been compliant with home exercises and is doing a great job icing, elevating, and  keeping herself motivated. Patient will benefit from skilled PT to address her LE weakness, gait impairments, ROM deficits, etc to be able to return to PLOF and be able to hike and walk with her dog again.   OBJECTIVE IMPAIRMENTS: Abnormal gait, decreased balance, difficulty walking, decreased ROM, decreased strength, increased edema, and pain.   ACTIVITY LIMITATIONS: bending, sitting, standing, squatting, stairs, transfers, and locomotion level  PARTICIPATION LIMITATIONS: meal prep, cleaning, driving, shopping, community activity, occupation, and yard work  [facility] POTENTIAL: Excellent  CLINICAL DECISION MAKING: Stable/uncomplicated  EVALUATION COMPLEXITY: Low  GOALS: Goals reviewed with patient? Yes   SHORT TERM GOALS: Target date: 05/30/24  Independent with initial HEP. Baseline:  Goal status: INITIAL  2.  Patient will demonstrate improved functional LE strength by completing 5x STS in <13 seconds w/o compensations Baseline: 16s Goal status: INITIAL   LONG TERM GOALS: Target date: 07/11/24  Independent with advanced/ongoing HEP to improve outcomes and carryover.  Baseline:  Goal status: INITIAL  2.  Patient will demonstrate L knee ROM 0-120 deg to ascend/descend stairs. Baseline: 3-0-100d with pain Goal status: INITIAL  3.  Patient will be able to ambulate 1000' safely with normal gait pattern to access community.  Baseline: antalgic gait, using SPC Goal status: INITIAL  4.  Patient will demonstrate LLE MMT 5/5  Baseline: 3/5 flexion and ext Goal status: INITIAL  5.  Patient will be able to return to doing long walks and short hikes with her dog Baseline:  Goal status: INITIAL  PLAN:  PT FREQUENCY: 2x/week  PT DURATION: 12 weeks  PLANNED INTERVENTIONS: 97110-Therapeutic exercises, 97530- Therapeutic  activity, 97112- Neuromuscular re-education, 97535- Self Care, [id number]- Manual therapy, [phone]- Gait training, [phone]- Vasopneumatic device, Patient/Family education, Balance training, Stair training, Joint mobilization, Cryotherapy, and Moist heat  PLAN FOR NEXT SESSION: LLE strengthening, ROM for L knee, functional movements   Almetta Fam, PT 04/18/2024, 5:05 PM

## 2024-04-15 NOTE — Progress Notes (Signed)
 Post-Op Visit Note   Patient: Lisa Duran           Date of Birth: 1980/08/14           MRN: [id number] Visit Date: 04/15/2024 PCP: Geofm Glade PARAS, MD   Assessment & Plan:  Chief Complaint:  Chief Complaint  Patient presents with   Left Knee - Routine Post Op    Left TKA 03/31/24   Visit Diagnoses:  1. History of total knee replacement, unspecified laterality     Plan: [name] is a patient is now 2 weeks out left total knee replacement done 03/31/2024.  She is doing well.  Ambulating with a walker.  Starts outpatient physical therapy on Monday.  Taking Celebrex  oxycodone  and muscle relaxer.  She is changed over from oxycodone  to Norco today and Robaxin  on exam no calf tenderness negative Homans.  She has range of motion of about 0-100 easy.  Incision intact.  Would like for her to decrease her Tylenol  dosing to at most 2000 mg a day.  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  Scheduled to go back on 05/11/2024.  We can assess whether that needs to be extended at the next office visit.  Follow-Up Instructions: No follow-ups on file.   Orders:  Orders Placed This Encounter  Procedures   XR Knee 1-2 Views Left   No orders of the defined types were placed in this encounter.   Imaging: No results found.  PMFS History: Patient Active Problem List   Diagnosis Date Noted   Status post left knee replacement 03/31/2024   Arthritis of left knee 11/20/2023   Tear of right acetabular labrum 07/21/2023   B12 deficiency 05/09/2021   Dyslipidemia 04/14/2019   Enteritis 01/12/2019   Sleep walking disorder 04/06/2017   Iron  deficiency anemia 03/27/2017   Fibromyalgia 02/03/2017   GERD (gastroesophageal reflux disease) 06/24/2015   Eczema 06/22/2015   Depression 06/22/2015   Tachycardia 06/16/2014   Food allergy    ADHD (attention deficit hyperactivity disorder) 01/27/2011   Anxiety 02/20/2010   Allergic  rhinitis 11/21/2009   OVARIAN CYST 11/21/2009   Diabetes mellitus type 2 with neurological manifestations (HCC) 11/20/2009   Class 2 obesity in adult 11/20/2009   Migraine without aura 11/20/2009   Past Medical History:  Diagnosis Date   ADHD (attention deficit hyperactivity disorder)    ALLERGIC RHINITIS    Allergy    Anemia    ANXIETY    Arthritis    left knee   Complication of anesthesia    2017 propofol  for EGD chest tightness, SOB. 2024 surgery had nystagmus after surgery   Depression    DIABETES MELLITUS, TYPE II    Dysrhythmia    Sinus Tachycardia - On Metoprolol    Fibromyalgia 2018   GERD (gastroesophageal reflux disease)    History of hiatal hernia    small hernia found on scan   History of kidney stones    MIGRAINE, COMMON    Obesity, unspecified    OVARIAN CYST     Family History  Problem Relation Age of Onset   Diabetes Mother    Hyperlipidemia Mother    CAD Mother    CVA Mother    Diabetes Father    Hyperlipidemia Father    Coronary artery disease Father    Hypertension Father    Cancer Father        esoph  Stroke Father    Cancer Maternal Grandfather 24       Prostate and Lung   Diabetes Sister        Gestational   Graves' disease Maternal Aunt    Cancer Maternal Uncle        Brain   Stroke Paternal Grandmother    Heart disease Paternal Grandmother    Stroke Paternal Grandfather    Heart disease Paternal Grandfather    Diabetes Sister        Gestational and Type II   Hashimoto's thyroiditis Cousin    Cancer Maternal Uncle        Brain    Past Surgical History:  Procedure Laterality Date   HIP ARTHROSCOPY W/ LABRAL REPAIR Right 07/2023   KNEE ARTHROSCOPY Left 08/25/2022   TOTAL KNEE ARTHROPLASTY Left 03/31/2024   Procedure: ARTHROPLASTY, KNEE, TOTAL;  Surgeon: Addie Cordella Hamilton, MD;  Location: [facility] OR;  Service: Orthopedics;  Laterality: Left;   Social History   Occupational History   Not on file  Tobacco Use   Smoking status:  Former    Current packs/day: 0.00    Types: Cigarettes    Quit date: 08/20/2002    Years since quitting: 21.6   Smokeless tobacco: Never   Tobacco comments:    single, separated from spouse 02/2010.   Vaping Use   Vaping status: Never Used  Substance and Sexual Activity   Alcohol use: Yes    Comment: maybe one drink per month   Drug use: No   Sexual activity: Not Currently    Birth control/protection: I.U.D.

## 2024-04-17 ENCOUNTER — Encounter: Payer: Self-pay | Admitting: Orthopedic Surgery

## 2024-04-18 ENCOUNTER — Other Ambulatory Visit: Payer: Self-pay | Admitting: Surgical

## 2024-04-18 ENCOUNTER — Ambulatory Visit: Attending: Orthopedic Surgery

## 2024-04-18 DIAGNOSIS — M25562 Pain in left knee: Secondary | ICD-10-CM | POA: Diagnosis present

## 2024-04-18 DIAGNOSIS — M6281 Muscle weakness (generalized): Secondary | ICD-10-CM | POA: Insufficient documentation

## 2024-04-18 DIAGNOSIS — R262 Difficulty in walking, not elsewhere classified: Secondary | ICD-10-CM | POA: Insufficient documentation

## 2024-04-18 DIAGNOSIS — M25662 Stiffness of left knee, not elsewhere classified: Secondary | ICD-10-CM | POA: Diagnosis present

## 2024-04-18 DIAGNOSIS — R600 Localized edema: Secondary | ICD-10-CM | POA: Insufficient documentation

## 2024-04-18 DIAGNOSIS — Z96659 Presence of unspecified artificial knee joint: Secondary | ICD-10-CM | POA: Diagnosis not present

## 2024-04-18 DIAGNOSIS — Z96652 Presence of left artificial knee joint: Secondary | ICD-10-CM | POA: Diagnosis present

## 2024-04-18 MED ORDER — OXYCODONE HCL 5 MG PO TABS: 5.0000 mg | ORAL_TABLET | Freq: Four times a day (QID) | ORAL | 0 refills | Status: DC | PRN

## 2024-04-21 ENCOUNTER — Ambulatory Visit: Admitting: Physical Therapy

## 2024-04-21 ENCOUNTER — Encounter: Payer: Self-pay | Admitting: Internal Medicine

## 2024-04-21 DIAGNOSIS — Z96652 Presence of left artificial knee joint: Secondary | ICD-10-CM | POA: Diagnosis not present

## 2024-04-21 DIAGNOSIS — M25562 Pain in left knee: Secondary | ICD-10-CM

## 2024-04-21 DIAGNOSIS — R262 Difficulty in walking, not elsewhere classified: Secondary | ICD-10-CM

## 2024-04-21 DIAGNOSIS — R600 Localized edema: Secondary | ICD-10-CM

## 2024-04-21 DIAGNOSIS — M6281 Muscle weakness (generalized): Secondary | ICD-10-CM

## 2024-04-21 DIAGNOSIS — M25662 Stiffness of left knee, not elsewhere classified: Secondary | ICD-10-CM

## 2024-04-21 MED ORDER — FLUCONAZOLE 150 MG PO TABS: 150.0000 mg | ORAL_TABLET | Freq: Once | ORAL | 0 refills | Status: AC

## 2024-04-21 NOTE — Therapy (Signed)
 OUTPATIENT PHYSICAL THERAPY LOWER EXTREMITY    Patient Name: Lisa Duran MRN: [id number] DOB:08/03/1981, 43 y.o., female Today's Date: 04/21/2024  END OF SESSION:  PT End of Session - 04/21/24 0843     Visit Number 2    Date for PT Re-Evaluation 07/11/24    Authorization Type [employer]    PT Start Time [phone]    PT Stop Time 0930    PT Time Calculation (min) 47 min          Past Medical History:  Diagnosis Date   ADHD (attention deficit hyperactivity disorder)    ALLERGIC RHINITIS    Allergy    Anemia    ANXIETY    Arthritis    left knee   Complication of anesthesia    2017 propofol  for EGD chest tightness, SOB. 2024 surgery had nystagmus after surgery   Depression    DIABETES MELLITUS, TYPE II    Dysrhythmia    Sinus Tachycardia - On Metoprolol    Fibromyalgia 2018   GERD (gastroesophageal reflux disease)    History of hiatal hernia    small hernia found on scan   History of kidney stones    MIGRAINE, COMMON    Obesity, unspecified    OVARIAN CYST    Past Surgical History:  Procedure Laterality Date   HIP ARTHROSCOPY W/ LABRAL REPAIR Right 07/2023   KNEE ARTHROSCOPY Left 08/25/2022   TOTAL KNEE ARTHROPLASTY Left 03/31/2024   Procedure: ARTHROPLASTY, KNEE, TOTAL;  Surgeon: Addie Cordella Hamilton, MD;  Location: [facility] OR;  Service: Orthopedics;  Laterality: Left;   Patient Active Problem List   Diagnosis Date Noted   Status post left knee replacement 03/31/2024   Arthritis of left knee 11/20/2023   Tear of right acetabular labrum 07/21/2023   B12 deficiency 05/09/2021   Dyslipidemia 04/14/2019   Enteritis 01/12/2019   Sleep walking disorder 04/06/2017   Iron  deficiency anemia 03/27/2017   Fibromyalgia 02/03/2017   GERD (gastroesophageal reflux disease) 06/24/2015   Eczema 06/22/2015   Depression 06/22/2015   Tachycardia 06/16/2014   Food allergy    ADHD (attention deficit hyperactivity disorder) 01/27/2011   Anxiety 02/20/2010   Allergic rhinitis  11/21/2009   OVARIAN CYST 11/21/2009   Diabetes mellitus type 2 with neurological manifestations (HCC) 11/20/2009   Class 2 obesity in adult 11/20/2009   Migraine without aura 11/20/2009    PCP: Glade Hope  REFERRING PROVIDER: Cordella Addie  REFERRING DIAG:  [id number] (ICD-10-CM) - History of total knee replacement, unspecified laterality    THERAPY DIAG:  S/P total knee arthroplasty, left  Stiffness of left knee, not elsewhere classified  Acute pain of left knee  Localized edema  Difficulty in walking, not elsewhere classified  Muscle weakness (generalized)  Rationale for Evaluation and Treatment: Rehabilitation  ONSET DATE: 03/31/24  SUBJECTIVE:   SUBJECTIVE STATEMENT: Rough night but better this morning. Doing HEP PERTINENT HISTORY: R Hip arthroscopy with labral repair 07/2023 L TKA 03/31/24   PAIN:  Are you having pain? Yes: NPRS scale: 4-5/10, worse at end of day Pain location: calf pain, L knee Pain description: achy Aggravating factors: walking, standing  Relieving factors: icing  PRECAUTIONS: Knee  RED FLAGS: None   WEIGHT BEARING RESTRICTIONS: No  FALLS:  Has patient fallen in last 6 months? No  LIVING ENVIRONMENT: Lives with: lives with their spouse Lives in: House/apartment Stairs: Yes: Internal: 14 steps; on right going up and on left going up Has following equipment at home: Single point cane  OCCUPATION:  office manager   PLOF: Independent and Independent with gait  PATIENT GOALS: get back to PLOF and long hikes with 100lb dog   NEXT MD VISIT: 05/31/24  OBJECTIVE:  Note: Objective measures were completed at Evaluation unless otherwise noted.  DIAGNOSTIC FINDINGS: AP lateral radiographs left knee reviewed. Total knee prosthesis in good  position alignment with no complicating features    COGNITION: Overall cognitive status: Within functional limits for tasks assessed     SENSATION: WFL, numb around the scar   EDEMA:   Swelling around L knee but pt reports it is significantly better   MUSCLE LENGTH: Some tightness in HS and calves   POSTURE: No Significant postural limitations  PALPATION: Some TTP and warm, scar has good healing   LOWER EXTREMITY ROM:  Active ROM Right eval Left eval Left 04/21/24  Hip flexion     Hip extension     Hip abduction     Hip adduction     Hip internal rotation     Hip external rotation     Knee flexion  100 106  Knee extension  -3 0  Ankle dorsiflexion     Ankle plantarflexion     Ankle inversion     Ankle eversion      (Blank rows = not tested)  LOWER EXTREMITY MMT:  MMT Right eval Left eval  Hip flexion  4+  Hip extension    Hip abduction    Hip adduction    Hip internal rotation    Hip external rotation    Knee flexion  3  Knee extension  3  Ankle dorsiflexion    Ankle plantarflexion    Ankle inversion    Ankle eversion     (Blank rows = not tested)  FUNCTIONAL TESTS:  5 times sit to stand: 16s Timed up and go (TUG): 13s w/SPC, 14s w/o  GAIT: Distance walked: in clinic distances Assistive device utilized: Single point cane Level of assistance: Modified independence Comments: antalgic gait, using SPC                                                                                                                                TREATMENT DATE:   04/21/24 Nustep L 5 Green tband HS curl 2 sets 10 3# LAQ 2 sets 10 Standing HS curl 3# 2 sets 10 Standing 3# SLR and hip flex 10 x each STM to Left knee , quad and HS PROM to increase flexion. ROM taken after Stretches on steps for home- calf, knee flexion and HS Leg Press 20 # BIl 2 sets 10 then unlocked left only 10 x Leg press calf raises 20# 2 sets 10    04/18/24- EVAL, HEP    PATIENT EDUCATION:  Education details: POC, HEP review Person educated: Patient Education method: Explanation Education comprehension: verbalized understanding  HOME EXERCISE PROGRAM: Quad set,  heel slides, SLR with ER and IR, LAQ, stair stretch,  standing HS curl, heel raises, STS   ASSESSMENT:  CLINICAL IMPRESSION:  pt arrives amb with SPC ( states left knee buckles at times). Progressed strength and ROM ex with cueing . STW and stretching esp for HS tightness. STG 1 met   Patient is a 43 y.o. female who was seen today for physical therapy evaluation and treatment for L TKA on 03/31/24. She is using the CPM 2-3x a day for about an hour. She is not going over 100d on it. Patient presents with weakness in her quads and hamstrings. She has been compliant with home exercises and is doing a great job icing, elevating, and keeping herself motivated. Patient will benefit from skilled PT to address her LE weakness, gait impairments, ROM deficits, etc to be able to return to PLOF and be able to hike and walk with her dog again.   OBJECTIVE IMPAIRMENTS: Abnormal gait, decreased balance, difficulty walking, decreased ROM, decreased strength, increased edema, and pain.   ACTIVITY LIMITATIONS: bending, sitting, standing, squatting, stairs, transfers, and locomotion level  PARTICIPATION LIMITATIONS: meal prep, cleaning, driving, shopping, community activity, occupation, and yard work  [facility] POTENTIAL: Excellent  CLINICAL DECISION MAKING: Stable/uncomplicated  EVALUATION COMPLEXITY: Low  GOALS: Goals reviewed with patient? Yes   SHORT TERM GOALS: Target date: 05/30/24  Independent with initial HEP. Baseline:  Goal status: 04/21/24 MET  2.  Patient will demonstrate improved functional LE strength by completing 5x STS in <13 seconds w/o compensations Baseline: 16s Goal status: INITIAL   LONG TERM GOALS: Target date: 07/11/24  Independent with advanced/ongoing HEP to improve outcomes and carryover.  Baseline:  Goal status: INITIAL  2.  Patient will demonstrate L knee ROM 0-120 deg to ascend/descend stairs. Baseline: 3-0-100d with pain Goal status: INITIAL  3.  Patient will be able  to ambulate 1000' safely with normal gait pattern to access community.  Baseline: antalgic gait, using SPC Goal status: INITIAL  4.  Patient will demonstrate LLE MMT 5/5  Baseline: 3/5 flexion and ext Goal status: INITIAL  5.  Patient will be able to return to doing long walks and short hikes with her dog Baseline:  Goal status: INITIAL  PLAN:  PT FREQUENCY: 2x/week  PT DURATION: 12 weeks  PLANNED INTERVENTIONS: 97110-Therapeutic exercises, 97530- Therapeutic activity, 97112- Neuromuscular re-education, 97535- Self Care, 02859- Manual therapy, [phone]- Gait training, [phone]- Vasopneumatic device, Patient/Family education, Balance training, Stair training, Joint mobilization, Cryotherapy, and Moist heat  PLAN FOR NEXT SESSION: LLE strengthening, ROM for L knee, functional movements   Tyrail Grandfield,ANGIE, PTA 04/21/2024, 8:44 AM

## 2024-04-25 ENCOUNTER — Encounter: Payer: Self-pay | Admitting: Physical Therapy

## 2024-04-25 ENCOUNTER — Ambulatory Visit: Admitting: Physical Therapy

## 2024-04-25 ENCOUNTER — Other Ambulatory Visit: Payer: Self-pay | Admitting: Surgical

## 2024-04-25 DIAGNOSIS — Z96652 Presence of left artificial knee joint: Secondary | ICD-10-CM | POA: Diagnosis not present

## 2024-04-25 DIAGNOSIS — M25562 Pain in left knee: Secondary | ICD-10-CM

## 2024-04-25 DIAGNOSIS — R262 Difficulty in walking, not elsewhere classified: Secondary | ICD-10-CM

## 2024-04-25 DIAGNOSIS — M6281 Muscle weakness (generalized): Secondary | ICD-10-CM

## 2024-04-25 DIAGNOSIS — R600 Localized edema: Secondary | ICD-10-CM

## 2024-04-25 DIAGNOSIS — M25662 Stiffness of left knee, not elsewhere classified: Secondary | ICD-10-CM

## 2024-04-25 MED ORDER — OXYCODONE HCL 5 MG PO TABS: 5.0000 mg | ORAL_TABLET | Freq: Three times a day (TID) | ORAL | 0 refills | Status: DC | PRN

## 2024-04-25 NOTE — Therapy (Signed)
 OUTPATIENT PHYSICAL THERAPY LOWER EXTREMITY    Patient Name: Lisa Duran MRN: [id number] DOB:[birth year], 43 y.o., female Today's Date: 04/25/2024  END OF SESSION:  PT End of Session - 04/25/24 1516     Visit Number 3    Date for PT Re-Evaluation 07/11/24    PT Start Time 1516    PT Stop Time 1600    PT Time Calculation (min) 44 min    Activity Tolerance Patient tolerated treatment well    Behavior During Therapy [facility] for tasks assessed/performed          Past Medical History:  Diagnosis Date   ADHD (attention deficit hyperactivity disorder)    ALLERGIC RHINITIS    Allergy    Anemia    ANXIETY    Arthritis    left knee   Complication of anesthesia    2017 propofol  for EGD chest tightness, SOB. 2024 surgery had nystagmus after surgery   Depression    DIABETES MELLITUS, TYPE II    Dysrhythmia    Sinus Tachycardia - On Metoprolol    Fibromyalgia 2018   GERD (gastroesophageal reflux disease)    History of hiatal hernia    small hernia found on scan   History of kidney stones    MIGRAINE, COMMON    Obesity, unspecified    OVARIAN CYST    Past Surgical History:  Procedure Laterality Date   HIP ARTHROSCOPY W/ LABRAL REPAIR Right 07/2023   KNEE ARTHROSCOPY Left 08/25/2022   TOTAL KNEE ARTHROPLASTY Left 03/31/2024   Procedure: ARTHROPLASTY, KNEE, TOTAL;  Surgeon: Addie Cordella Hamilton, MD;  Location: [facility] OR;  Service: Orthopedics;  Laterality: Left;   Patient Active Problem List   Diagnosis Date Noted   Status post left knee replacement 03/31/2024   Arthritis of left knee 11/20/2023   Tear of right acetabular labrum 07/21/2023   B12 deficiency 05/09/2021   Dyslipidemia 04/14/2019   Enteritis 01/12/2019   Sleep walking disorder 04/06/2017   Iron  deficiency anemia 03/27/2017   Fibromyalgia 02/03/2017   GERD (gastroesophageal reflux disease) 06/24/2015   Eczema 06/22/2015   Depression 06/22/2015   Tachycardia 06/16/2014   Food allergy    ADHD  (attention deficit hyperactivity disorder) 01/27/2011   Anxiety 02/20/2010   Allergic rhinitis 11/21/2009   OVARIAN CYST 11/21/2009   Diabetes mellitus type 2 with neurological manifestations (HCC) 11/20/2009   Class 2 obesity in adult 11/20/2009   Migraine without aura 11/20/2009    PCP: Glade Hope  REFERRING PROVIDER: Cordella Addie  REFERRING DIAG:  [phone] (ICD-10-CM) - History of total knee replacement, unspecified laterality    THERAPY DIAG:  S/P total knee arthroplasty, left  Stiffness of left knee, not elsewhere classified  Acute pain of left knee  Localized edema  Difficulty in walking, not elsewhere classified  Muscle weakness (generalized)  Rationale for Evaluation and Treatment: Rehabilitation  ONSET DATE: 03/31/24  SUBJECTIVE:   SUBJECTIVE STATEMENT: Its going good   PERTINENT HISTORY: R Hip arthroscopy with labral repair 07/2023 L TKA 03/31/24   PAIN:  Are you having pain? Yes: NPRS scale: 3-4/10, worse at end of day Pain location: calf pain, L knee Pain description: achy Aggravating factors: walking, standing  Relieving factors: icing  PRECAUTIONS: Knee  RED FLAGS: None   WEIGHT BEARING RESTRICTIONS: No  FALLS:  Has patient fallen in last 6 months? No  LIVING ENVIRONMENT: Lives with: lives with their spouse Lives in: House/apartment Stairs: Yes: Internal: 14 steps; on right going up and on left going up  Has following equipment at home: Single point cane  OCCUPATION: office manager   PLOF: Independent and Independent with gait  PATIENT GOALS: get back to PLOF and long hikes with 100lb dog   NEXT MD VISIT: 05/31/24  OBJECTIVE:  Note: Objective measures were completed at Evaluation unless otherwise noted.  DIAGNOSTIC FINDINGS: AP lateral radiographs left knee reviewed. Total knee prosthesis in good  position alignment with no complicating features    COGNITION: Overall cognitive status: Within functional limits for tasks  assessed     SENSATION: WFL, numb around the scar   EDEMA:  Swelling around L knee but pt reports it is significantly better   MUSCLE LENGTH: Some tightness in HS and calves   POSTURE: No Significant postural limitations  PALPATION: Some TTP and warm, scar has good healing   LOWER EXTREMITY ROM:  Active ROM Right eval Left eval Left 04/21/24  Hip flexion     Hip extension     Hip abduction     Hip adduction     Hip internal rotation     Hip external rotation     Knee flexion  100 106  Knee extension  -3 0  Ankle dorsiflexion     Ankle plantarflexion     Ankle inversion     Ankle eversion      (Blank rows = not tested)  LOWER EXTREMITY MMT:  MMT Right eval Left eval  Hip flexion  4+  Hip extension    Hip abduction    Hip adduction    Hip internal rotation    Hip external rotation    Knee flexion  3  Knee extension  3  Ankle dorsiflexion    Ankle plantarflexion    Ankle inversion    Ankle eversion     (Blank rows = not tested)  FUNCTIONAL TESTS:  5 times sit to stand: 16s Timed up and go (TUG): 13s w/SPC, 14s w/o  GAIT: Distance walked: in clinic distances Assistive device utilized: Single point cane Level of assistance: Modified independence Comments: antalgic gait, using SPC                                                                                                                                TREATMENT DATE:  04/25/24 NuStep L 5 x 6 min L knee PROM with end range holds  Patellar mobs LAQ LLE 3lb 2x10 LLE HS curls green 2x10 6in step ups x10   Leg press 30lb 2x10    04/21/24 Nustep L 5 Green tband HS curl 2 sets 10 3# LAQ 2 sets 10 Standing HS curl 3# 2 sets 10 Standing 3# SLR and hip flex 10 x each STM to Left knee , quad and HS PROM to increase flexion. ROM taken after Stretches on steps for home- calf, knee flexion and HS Leg Press 20 # BIl 2 sets 10 then unlocked left only 10 x Leg press calf raises  20# 2 sets  10    04/18/24- EVAL, HEP    PATIENT EDUCATION:  Education details: POC, HEP review Person educated: Patient Education method: Explanation Education comprehension: verbalized understanding  HOME EXERCISE PROGRAM: Quad set, heel slides, SLR with ER and IR, LAQ, stair stretch, standing HS curl, heel raises, STS   ASSESSMENT:  CLINICAL IMPRESSION:  Again pt arrives amb with SPC ( states left knee buckles at times). Continued with a progression of strength and ROM ex with cueing . STW and stretching to increase L knee ROM. CGA needed with step ups due to instability and weakness. Good effort during session.   Patient is a 43 y.o. female who was seen today for physical therapy evaluation and treatment for L TKA on 03/31/24. She is using the CPM 2-3x a day for about an hour. She is not going over 100d on it. Patient presents with weakness in her quads and hamstrings. She has been compliant with home exercises and is doing a great job icing, elevating, and keeping herself motivated. Patient will benefit from skilled PT to address her LE weakness, gait impairments, ROM deficits, etc to be able to return to PLOF and be able to hike and walk with her dog again.   OBJECTIVE IMPAIRMENTS: Abnormal gait, decreased balance, difficulty walking, decreased ROM, decreased strength, increased edema, and pain.   ACTIVITY LIMITATIONS: bending, sitting, standing, squatting, stairs, transfers, and locomotion level  PARTICIPATION LIMITATIONS: meal prep, cleaning, driving, shopping, community activity, occupation, and yard work  [facility] POTENTIAL: Excellent  CLINICAL DECISION MAKING: Stable/uncomplicated  EVALUATION COMPLEXITY: Low  GOALS: Goals reviewed with patient? Yes   SHORT TERM GOALS: Target date: 05/30/24  Independent with initial HEP. Baseline:  Goal status: 04/21/24 MET  2.  Patient will demonstrate improved functional LE strength by completing 5x STS in <13 seconds w/o compensations Baseline:  16s Goal status: INITIAL   LONG TERM GOALS: Target date: 07/11/24  Independent with advanced/ongoing HEP to improve outcomes and carryover.  Baseline:  Goal status: INITIAL  2.  Patient will demonstrate L knee ROM 0-120 deg to ascend/descend stairs. Baseline: 3-0-100d with pain Goal status: INITIAL  3.  Patient will be able to ambulate 1000' safely with normal gait pattern to access community.  Baseline: antalgic gait, using SPC Goal status: INITIAL  4.  Patient will demonstrate LLE MMT 5/5  Baseline: 3/5 flexion and ext Goal status: INITIAL  5.  Patient will be able to return to doing long walks and short hikes with her dog Baseline:  Goal status: INITIAL  PLAN:  PT FREQUENCY: 2x/week  PT DURATION: 12 weeks  PLANNED INTERVENTIONS: 97110-Therapeutic exercises, 97530- Therapeutic activity, 97112- Neuromuscular re-education, 97535- Self Care, 02859- Manual therapy, [phone]- Gait training, [phone]- Vasopneumatic device, Patient/Family education, Balance training, Stair training, Joint mobilization, Cryotherapy, and Moist heat  PLAN FOR NEXT SESSION: LLE strengthening, ROM for L knee, functional movements   Tanda KANDICE Sorrow, PTA 04/25/2024, 3:17 PM

## 2024-04-28 ENCOUNTER — Ambulatory Visit: Admitting: Physical Therapy

## 2024-04-28 DIAGNOSIS — M25562 Pain in left knee: Secondary | ICD-10-CM

## 2024-04-28 DIAGNOSIS — Z96652 Presence of left artificial knee joint: Secondary | ICD-10-CM

## 2024-04-28 DIAGNOSIS — R600 Localized edema: Secondary | ICD-10-CM

## 2024-04-28 DIAGNOSIS — M6281 Muscle weakness (generalized): Secondary | ICD-10-CM

## 2024-04-28 DIAGNOSIS — M25662 Stiffness of left knee, not elsewhere classified: Secondary | ICD-10-CM

## 2024-04-28 NOTE — Therapy (Signed)
 OUTPATIENT PHYSICAL THERAPY LOWER EXTREMITY    Patient Name: Lisa Duran MRN: [id number] DOB:[birth year], 43 y.o., female Today's Date: 04/28/2024  END OF SESSION:  PT End of Session - 04/28/24 0925     Visit Number 4    Date for PT Re-Evaluation 07/11/24    Authorization Type [employer]    PT Start Time 0927    PT Stop Time 1015    PT Time Calculation (min) 48 min          Past Medical History:  Diagnosis Date   ADHD (attention deficit hyperactivity disorder)    ALLERGIC RHINITIS    Allergy    Anemia    ANXIETY    Arthritis    left knee   Complication of anesthesia    2017 propofol  for EGD chest tightness, SOB. 2024 surgery had nystagmus after surgery   Depression    DIABETES MELLITUS, TYPE II    Dysrhythmia    Sinus Tachycardia - On Metoprolol    Fibromyalgia 2018   GERD (gastroesophageal reflux disease)    History of hiatal hernia    small hernia found on scan   History of kidney stones    MIGRAINE, COMMON    Obesity, unspecified    OVARIAN CYST    Past Surgical History:  Procedure Laterality Date   HIP ARTHROSCOPY W/ LABRAL REPAIR Right 07/2023   KNEE ARTHROSCOPY Left 08/25/2022   TOTAL KNEE ARTHROPLASTY Left 03/31/2024   Procedure: ARTHROPLASTY, KNEE, TOTAL;  Surgeon: Addie Cordella Hamilton, MD;  Location: [facility] OR;  Service: Orthopedics;  Laterality: Left;   Patient Active Problem List   Diagnosis Date Noted   Status post left knee replacement 03/31/2024   Arthritis of left knee 11/20/2023   Tear of right acetabular labrum 07/21/2023   B12 deficiency 05/09/2021   Dyslipidemia 04/14/2019   Enteritis 01/12/2019   Sleep walking disorder 04/06/2017   Iron  deficiency anemia 03/27/2017   Fibromyalgia 02/03/2017   GERD (gastroesophageal reflux disease) 06/24/2015   Eczema 06/22/2015   Depression 06/22/2015   Tachycardia 06/16/2014   Food allergy    ADHD (attention deficit hyperactivity disorder) 01/27/2011   Anxiety 02/20/2010   Allergic rhinitis  11/21/2009   OVARIAN CYST 11/21/2009   Diabetes mellitus type 2 with neurological manifestations (HCC) 11/20/2009   Class 2 obesity in adult 11/20/2009   Migraine without aura 11/20/2009    PCP: Glade Hope  REFERRING PROVIDER: Cordella Addie  REFERRING DIAG:  [phone] (ICD-10-CM) - History of total knee replacement, unspecified laterality    THERAPY DIAG:  S/P total knee arthroplasty, left  Stiffness of left knee, not elsewhere classified  Acute pain of left knee  Localized edema  Muscle weakness (generalized)  Rationale for Evaluation and Treatment: Rehabilitation  ONSET DATE: 03/31/24  SUBJECTIVE:   SUBJECTIVE STATEMENT: Not sleeping well. Fibro flare too so that is not helping  PERTINENT HISTORY: R Hip arthroscopy with labral repair 07/2023 L TKA 03/31/24   PAIN:  Are you having pain? Yes: NPRS scale: 3-4/10, worse at end of day Pain location: calf pain, L knee Pain description: achy Aggravating factors: walking, standing  Relieving factors: icing  PRECAUTIONS: Knee  RED FLAGS: None   WEIGHT BEARING RESTRICTIONS: No  FALLS:  Has patient fallen in last 6 months? No  LIVING ENVIRONMENT: Lives with: lives with their spouse Lives in: House/apartment Stairs: Yes: Internal: 14 steps; on right going up and on left going up Has following equipment at home: Single point cane  OCCUPATION: Print production planner  PLOF: Independent and Independent with gait  PATIENT GOALS: get back to PLOF and long hikes with 100lb dog   NEXT MD VISIT: 05/31/24  OBJECTIVE:  Note: Objective measures were completed at Evaluation unless otherwise noted.  DIAGNOSTIC FINDINGS: AP lateral radiographs left knee reviewed. Total knee prosthesis in good  position alignment with no complicating features    COGNITION: Overall cognitive status: Within functional limits for tasks assessed     SENSATION: WFL, numb around the scar   EDEMA:  Swelling around L knee but pt reports it is  significantly better   MUSCLE LENGTH: Some tightness in HS and calves   POSTURE: No Significant postural limitations  PALPATION: Some TTP and warm, scar has good healing   LOWER EXTREMITY ROM:  Active ROM Right eval Left eval Left 04/21/24  Hip flexion     Hip extension     Hip abduction     Hip adduction     Hip internal rotation     Hip external rotation     Knee flexion  100 106  Knee extension  -3 0  Ankle dorsiflexion     Ankle plantarflexion     Ankle inversion     Ankle eversion      (Blank rows = not tested)  LOWER EXTREMITY MMT:  MMT Right eval Left eval  Hip flexion  4+  Hip extension    Hip abduction    Hip adduction    Hip internal rotation    Hip external rotation    Knee flexion  3  Knee extension  3  Ankle dorsiflexion    Ankle plantarflexion    Ankle inversion    Ankle eversion     (Blank rows = not tested)  FUNCTIONAL TESTS:  5 times sit to stand: 16s Timed up and go (TUG): 13s w/SPC, 14s w/o  GAIT: Distance walked: in clinic distances Assistive device utilized: Single point cane Level of assistance: Modified independence Comments: antalgic gait, using SPC                                                                                                                                TREATMENT DATE:  04/28/24 Nustep L 5 LE only Resited gait  30# 5 x fwd and backward, 3 x each PROM left knee with end range stretch AROM   0-111 MMT ext 4,flexion 4+ Leg Press 30# 2 sets 10 BIL , left leg unlocked 10 x  Calf raises 20# 2 sets 12 HS curl BIL 20# 2 sets 10, 10 # Left only 10 x Knee ext 5# 10 BIL ( 10# was too heavy), 2nd set up with both lower with Left 3 x then stopped d/t struggling and pain , resume BIL 10 x Step up 5 x - educ to add to HEP     04/25/24 NuStep L 5 x 6 min L knee PROM with end range holds  Patellar mobs  LAQ LLE 3lb 2x10 LLE HS curls green 2x10 6in step ups x10   Leg press 30lb 2x10    04/21/24 Nustep L  5 Green tband HS curl 2 sets 10 3# LAQ 2 sets 10 Standing HS curl 3# 2 sets 10 Standing 3# SLR and hip flex 10 x each STM to Left knee , quad and HS PROM to increase flexion. ROM taken after Stretches on steps for home- calf, knee flexion and HS Leg Press 20 # BIl 2 sets 10 then unlocked left only 10 x Leg press calf raises 20# 2 sets 10    04/18/24- EVAL, HEP    PATIENT EDUCATION:  Education details: POC, HEP review Person educated: Patient Education method: Explanation Education comprehension: verbalized understanding  HOME EXERCISE PROGRAM: Quad set, heel slides, SLR with ER and IR, LAQ, stair stretch, standing HS curl, heel raises, STS   ASSESSMENT:  CLINICAL IMPRESSION:  assessed goals, ROM and MMT. Test strong but weakness with resistance with SL leg press and LAQ. Educ on ice and elevaton esp after extended periods of actvity.  Patient is a 43 y.o. female who was seen today for physical therapy evaluation and treatment for L TKA on 03/31/24. She is using the CPM 2-3x a day for about an hour. She is not going over 100d on it. Patient presents with weakness in her quads and hamstrings. She has been compliant with home exercises and is doing a great job icing, elevating, and keeping herself motivated. Patient will benefit from skilled PT to address her LE weakness, gait impairments, ROM deficits, etc to be able to return to PLOF and be able to hike and walk with her dog again.   OBJECTIVE IMPAIRMENTS: Abnormal gait, decreased balance, difficulty walking, decreased ROM, decreased strength, increased edema, and pain.   ACTIVITY LIMITATIONS: bending, sitting, standing, squatting, stairs, transfers, and locomotion level  PARTICIPATION LIMITATIONS: meal prep, cleaning, driving, shopping, community activity, occupation, and yard work  [facility] POTENTIAL: Excellent  CLINICAL DECISION MAKING: Stable/uncomplicated  EVALUATION COMPLEXITY: Low  GOALS: Goals reviewed with patient?  Yes   SHORT TERM GOALS: Target date: 05/30/24  Independent with initial HEP. Baseline:  Goal status: 04/21/24 MET  2.  Patient will demonstrate improved functional LE strength by completing 5x STS in <13 seconds w/o compensations Baseline: 16s Goal status: 04/28/24 MET   LONG TERM GOALS: Target date: 07/11/24  Independent with advanced/ongoing HEP to improve outcomes and carryover.  Baseline:  Goal status: INITIAL  2.  Patient will demonstrate L knee ROM 0-120 deg to ascend/descend stairs. Baseline: 3-0-100d with pain Goal status: INITIAL  3.  Patient will be able to ambulate 1000' safely with normal gait pattern to access community.  Baseline: antalgic gait, using SPC Goal status: INITIAL  4.  Patient will demonstrate LLE MMT 5/5  Baseline: 3/5 flexion and ext Goal status: INITIAL  5.  Patient will be able to return to doing long walks and short hikes with her dog Baseline:  Goal status: INITIAL  PLAN:  PT FREQUENCY: 2x/week  PT DURATION: 12 weeks  PLANNED INTERVENTIONS: 97110-Therapeutic exercises, 97530- Therapeutic activity, 97112- Neuromuscular re-education, 97535- Self Care, 02859- Manual therapy, [phone]- Gait training, [phone]- Vasopneumatic device, Patient/Family education, Balance training, Stair training, Joint mobilization, Cryotherapy, and Moist heat  PLAN FOR NEXT SESSION: LLE strengthening, ROM for L knee, functional movements   Shondale Quinley,ANGIE, PTA 04/28/2024, 9:25 AM

## 2024-05-02 ENCOUNTER — Other Ambulatory Visit: Payer: Self-pay | Admitting: Internal Medicine

## 2024-05-02 ENCOUNTER — Ambulatory Visit

## 2024-05-02 DIAGNOSIS — Z96652 Presence of left artificial knee joint: Secondary | ICD-10-CM

## 2024-05-02 DIAGNOSIS — R600 Localized edema: Secondary | ICD-10-CM

## 2024-05-02 DIAGNOSIS — M25662 Stiffness of left knee, not elsewhere classified: Secondary | ICD-10-CM

## 2024-05-02 DIAGNOSIS — M25562 Pain in left knee: Secondary | ICD-10-CM

## 2024-05-02 NOTE — Therapy (Unsigned)
 OUTPATIENT PHYSICAL THERAPY LOWER EXTREMITY    Patient Name: Lisa Duran MRN: [id number] DOB:[birth year], 43 y.o., female Today's Date: 05/03/2024  END OF SESSION:  PT End of Session - 05/02/24 1433     Visit Number 5    Date for Recertification  07/11/24    Authorization Type U[employer]    PT Start Time 1430    PT Stop Time 1515    PT Time Calculation (min) 45 min    Activity Tolerance Patient tolerated treatment well    Behavior During Therapy [facility] for tasks assessed/performed           Past Medical History:  Diagnosis Date   ADHD (attention deficit hyperactivity disorder)    ALLERGIC RHINITIS    Allergy    Anemia    ANXIETY    Arthritis    left knee   Complication of anesthesia    2017 propofol  for EGD chest tightness, SOB. 2024 surgery had nystagmus after surgery   Depression    DIABETES MELLITUS, TYPE II    Dysrhythmia    Sinus Tachycardia - On Metoprolol    Fibromyalgia 2018   GERD (gastroesophageal reflux disease)    History of hiatal hernia    small hernia found on scan   History of kidney stones    MIGRAINE, COMMON    Obesity, unspecified    OVARIAN CYST    Past Surgical History:  Procedure Laterality Date   HIP ARTHROSCOPY W/ LABRAL REPAIR Right 07/2023   KNEE ARTHROSCOPY Left 08/25/2022   TOTAL KNEE ARTHROPLASTY Left 03/31/2024   Procedure: ARTHROPLASTY, KNEE, TOTAL;  Surgeon: Addie Cordella Hamilton, MD;  Location: [facility] OR;  Service: Orthopedics;  Laterality: Left;   Patient Active Problem List   Diagnosis Date Noted   Status post left knee replacement 03/31/2024   Arthritis of left knee 11/20/2023   Tear of right acetabular labrum 07/21/2023   B12 deficiency 05/09/2021   Dyslipidemia 04/14/2019   Enteritis 01/12/2019   Sleep walking disorder 04/06/2017   Iron  deficiency anemia 03/27/2017   Fibromyalgia 02/03/2017   GERD (gastroesophageal reflux disease) 06/24/2015   Eczema 06/22/2015   Depression 06/22/2015   Tachycardia 06/16/2014    Food allergy    ADHD (attention deficit hyperactivity disorder) 01/27/2011   Anxiety 02/20/2010   Allergic rhinitis 11/21/2009   OVARIAN CYST 11/21/2009   Diabetes mellitus type 2 with neurological manifestations (HCC) 11/20/2009   Class 2 obesity in adult 11/20/2009   Migraine without aura 11/20/2009    PCP: Glade Hope  REFERRING PROVIDER: Cordella Addie  REFERRING DIAG:  [phone] (ICD-10-CM) - History of total knee replacement, unspecified laterality    THERAPY DIAG:  S/P total knee arthroplasty, left  Stiffness of left knee, not elsewhere classified  Acute pain of left knee  Localized edema  Rationale for Evaluation and Treatment: Rehabilitation  ONSET DATE: 03/31/24  SUBJECTIVE:   SUBJECTIVE STATEMENT: [place] this [place], [place], also walked in the [place] this am. Pain now around 5/10 . To relieve my pain I kind of lunge forward and bend my L knee and it relieves some of the pressure   PERTINENT HISTORY: R Hip arthroscopy with labral repair 07/2023 L TKA 03/31/24   PAIN:  Are you having pain? Yes: NPRS scale: 3-4/10, worse at end of day Pain location: calf pain, L knee Pain description: achy Aggravating factors: walking, standing  Relieving factors: icing  PRECAUTIONS: Knee  RED FLAGS: None   WEIGHT  BEARING RESTRICTIONS: No  FALLS:  Has patient fallen in last 6 months? No  LIVING ENVIRONMENT: Lives with: lives with their spouse Lives in: House/apartment Stairs: Yes: Internal: 14 steps; on right going up and on left going up Has following equipment at home: Single point cane  OCCUPATION: office manager   PLOF: Independent and Independent with gait  PATIENT GOALS: get back to PLOF and long hikes with 100lb dog   NEXT MD VISIT: 05/31/24  OBJECTIVE:  Note: Objective measures were completed at Evaluation unless otherwise noted.  DIAGNOSTIC FINDINGS: AP lateral radiographs left knee reviewed.  Total knee prosthesis in good  position alignment with no complicating features    COGNITION: Overall cognitive status: Within functional limits for tasks assessed     SENSATION: WFL, numb around the scar   EDEMA:  Swelling around L knee but pt reports it is significantly better   MUSCLE LENGTH: Some tightness in HS and calves   POSTURE: No Significant postural limitations  PALPATION: Some TTP and warm, scar has good healing   LOWER EXTREMITY ROM:  Active ROM Right eval Left eval Left 04/21/24 L  05/02/24  Hip flexion      Hip extension      Hip abduction      Hip adduction      Hip internal rotation      Hip external rotation      Knee flexion  100 106 116  Knee extension  -3 0 0  Ankle dorsiflexion      Ankle plantarflexion      Ankle inversion      Ankle eversion       (Blank rows = not tested)  LOWER EXTREMITY MMT:  MMT Right eval Left eval  Hip flexion  4+  Hip extension    Hip abduction    Hip adduction    Hip internal rotation    Hip external rotation    Knee flexion  3  Knee extension  3  Ankle dorsiflexion    Ankle plantarflexion    Ankle inversion    Ankle eversion     (Blank rows = not tested)  FUNCTIONAL TESTS:  5 times sit to stand: 16s Timed up and go (TUG): 13s w/SPC, 14s w/o  GAIT: Distance walked: in clinic distances Assistive device utilized: Single point cane Level of assistance: Modified independence Comments: antalgic gait, using SPC                                                                                                                                TREATMENT DATE:  05/02/24:  Nustep LE's only L5 x 6 min Seated manual assisted stretching L knee flexion Seated SLR, and seated SLR with L LE ER 45 degrees, advanced from supine position for this specific ex. In ll bars, for lateral heel taps R off airex mass practice Standing heel/toe rocks emphasis on position sense, balance when rocking back Hamstring curls  20# 2  x 10 L LE only 10#  2 x 10 B knee ext 5# 2 x 10 B heel lifts on incline board, 2 x 10  Towel slides R for/back, side/side with L knee unlocked, mass practice to engage balance/proprioception L knee Seated manual stretch again L knee flexion x 5   04/28/24 Nustep L 5 LE only Resited gait  30# 5 x fwd and backward, 3 x each PROM left knee with end range stretch AROM   0-111 MMT ext 4,flexion 4+ Leg Press 30# 2 sets 10 BIL , left leg unlocked 10 x  Calf raises 20# 2 sets 12 HS curl BIL 20# 2 sets 10, 10 # Left only 10 x Knee ext 5# 10 BIL ( 10# was too heavy), 2nd set up with both lower with Left 3 x then stopped d/t struggling and pain , resume BIL 10 x Step up 5 x - educ to add to HEP  04/25/24 NuStep L 5 x 6 min L knee PROM with end range holds  Patellar mobs LAQ LLE 3lb 2x10 LLE HS curls green 2x10 6in step ups x10   Leg press 30lb 2x10    04/21/24 Nustep L 5 Green tband HS curl 2 sets 10 3# LAQ 2 sets 10 Standing HS curl 3# 2 sets 10 Standing 3# SLR and hip flex 10 x each STM to Left knee , quad and HS PROM to increase flexion. ROM taken after Stretches on steps for home- calf, knee flexion and HS Leg Press 20 # BIl 2 sets 10 then unlocked left only 10 x Leg press calf raises 20# 2 sets 10    04/18/24- EVAL, HEP    PATIENT EDUCATION:  Education details: POC, HEP review Person educated: Patient Education method: Explanation Education comprehension: verbalized understanding  HOME EXERCISE PROGRAM: Quad set, heel slides, SLR with ER and IR, LAQ, stair stretch, standing HS curl, heel raises, STS   ASSESSMENT:  CLINICAL IMPRESSION:  05/02/24: today we continued to progress with guided strengthening and flexibility training for L knee, she is just at 4 week post op mark.  Added more proprioceptive training as she reports some buckling L knee at time, so she continues to utilize the st cane for safety/stability. She continues to benefit from physical therapy to  address her recovery of function following L TKA. Patient is a 43 y.o. female who was seen today for physical therapy evaluation and treatment for L TKA on 03/31/24. She is using the CPM 2-3x a day for about an hour. She is not going over 100d on it. Patient presents with weakness in her quads and hamstrings. She has been compliant with home exercises and is doing a great job icing, elevating, and keeping herself motivated. Patient will benefit from skilled PT to address her LE weakness, gait impairments, ROM deficits, etc to be able to return to PLOF and be able to hike and walk with her dog again.   OBJECTIVE IMPAIRMENTS: Abnormal gait, decreased balance, difficulty walking, decreased ROM, decreased strength, increased edema, and pain.   ACTIVITY LIMITATIONS: bending, sitting, standing, squatting, stairs, transfers, and locomotion level  PARTICIPATION LIMITATIONS: meal prep, cleaning, driving, shopping, community activity, occupation, and yard work  [facility] POTENTIAL: Excellent  CLINICAL DECISION MAKING: Stable/uncomplicated  EVALUATION COMPLEXITY: Low  GOALS: Goals reviewed with patient? Yes   SHORT TERM GOALS: Target date: 05/30/24  Independent with initial HEP. Baseline:  Goal status: 04/21/24 MET  2.  Patient will demonstrate improved  functional LE strength by completing 5x STS in <13 seconds w/o compensations Baseline: 16s Goal status: 04/28/24 MET   LONG TERM GOALS: Target date: 07/11/24  Independent with advanced/ongoing HEP to improve outcomes and carryover.  Baseline:  Goal status: INITIAL  2.  Patient will demonstrate L knee ROM 0-120 deg to ascend/descend stairs. Baseline: 3-0-100d with pain Goal status: INITIAL  3.  Patient will be able to ambulate 1000' safely with normal gait pattern to access community.  Baseline: antalgic gait, using SPC Goal status: INITIAL  4.  Patient will demonstrate LLE MMT 5/5  Baseline: 3/5 flexion and ext Goal status: INITIAL  5.   Patient will be able to return to doing long walks and short hikes with her dog Baseline:  Goal status: INITIAL  PLAN:  PT FREQUENCY: 2x/week  PT DURATION: 12 weeks  PLANNED INTERVENTIONS: 97110-Therapeutic exercises, 97530- Therapeutic activity, 97112- Neuromuscular re-education, 97535- Self Care, 02859- Manual therapy, [phone]- Gait training, [phone]- Vasopneumatic device, Patient/Family education, Balance training, Stair training, Joint mobilization, Cryotherapy, and Moist heat  PLAN FOR NEXT SESSION: LLE strengthening, ROM for L knee, functional movements   Leverne Amrhein L Aminah Zabawa, PT, DPT, OCS 05/03/2024, 2:40 PM

## 2024-05-03 ENCOUNTER — Other Ambulatory Visit: Payer: Self-pay

## 2024-05-05 ENCOUNTER — Ambulatory Visit: Admitting: Physical Therapy

## 2024-05-09 ENCOUNTER — Encounter: Payer: Self-pay | Admitting: Orthopedic Surgery

## 2024-05-09 ENCOUNTER — Ambulatory Visit: Admitting: Physical Therapy

## 2024-05-09 ENCOUNTER — Encounter: Payer: Self-pay | Admitting: Physical Therapy

## 2024-05-09 ENCOUNTER — Ambulatory Visit: Admitting: Orthopedic Surgery

## 2024-05-09 DIAGNOSIS — Z96652 Presence of left artificial knee joint: Secondary | ICD-10-CM | POA: Diagnosis not present

## 2024-05-09 DIAGNOSIS — Z96659 Presence of unspecified artificial knee joint: Secondary | ICD-10-CM

## 2024-05-09 DIAGNOSIS — M25662 Stiffness of left knee, not elsewhere classified: Secondary | ICD-10-CM

## 2024-05-09 DIAGNOSIS — M6281 Muscle weakness (generalized): Secondary | ICD-10-CM

## 2024-05-09 DIAGNOSIS — M25562 Pain in left knee: Secondary | ICD-10-CM

## 2024-05-09 DIAGNOSIS — R600 Localized edema: Secondary | ICD-10-CM

## 2024-05-09 NOTE — Therapy (Signed)
 OUTPATIENT PHYSICAL THERAPY LOWER EXTREMITY    Patient Name: Lisa Duran MRN: [id number] DOB:1980/11/04, 43 y.o., female Today's Date: 05/09/2024  END OF SESSION:  PT End of Session - 05/09/24 0929     Visit Number 6    Date for Recertification  07/11/24    PT Start Time 0930    PT Stop Time 1015    PT Time Calculation (min) 45 min    Activity Tolerance Patient tolerated treatment well    Behavior During Therapy [facility] for tasks assessed/performed           Past Medical History:  Diagnosis Date   ADHD (attention deficit hyperactivity disorder)    ALLERGIC RHINITIS    Allergy    Anemia    ANXIETY    Arthritis    left knee   Complication of anesthesia    2017 propofol  for EGD chest tightness, SOB. 2024 surgery had nystagmus after surgery   Depression    DIABETES MELLITUS, TYPE II    Dysrhythmia    Sinus Tachycardia - On Metoprolol    Fibromyalgia 2018   GERD (gastroesophageal reflux disease)    History of hiatal hernia    small hernia found on scan   History of kidney stones    MIGRAINE, COMMON    Obesity, unspecified    OVARIAN CYST    Past Surgical History:  Procedure Laterality Date   HIP ARTHROSCOPY W/ LABRAL REPAIR Right 07/2023   KNEE ARTHROSCOPY Left 08/25/2022   TOTAL KNEE ARTHROPLASTY Left 03/31/2024   Procedure: ARTHROPLASTY, KNEE, TOTAL;  Surgeon: Addie Cordella Hamilton, MD;  Location: [facility] OR;  Service: Orthopedics;  Laterality: Left;   Patient Active Problem List   Diagnosis Date Noted   Status post left knee replacement 03/31/2024   Arthritis of left knee 11/20/2023   Tear of right acetabular labrum 07/21/2023   B12 deficiency 05/09/2021   Dyslipidemia 04/14/2019   Enteritis 01/12/2019   Sleep walking disorder 04/06/2017   Iron  deficiency anemia 03/27/2017   Fibromyalgia 02/03/2017   GERD (gastroesophageal reflux disease) 06/24/2015   Eczema 06/22/2015   Depression 06/22/2015   Tachycardia 06/16/2014   Food allergy    ADHD  (attention deficit hyperactivity disorder) 01/27/2011   Anxiety 02/20/2010   Allergic rhinitis 11/21/2009   OVARIAN CYST 11/21/2009   Diabetes mellitus type 2 with neurological manifestations (HCC) 11/20/2009   Class 2 obesity in adult 11/20/2009   Migraine without aura 11/20/2009    PCP: Glade Hope  REFERRING PROVIDER: Cordella Addie  REFERRING DIAG:  [id number] (ICD-10-CM) - History of total knee replacement, unspecified laterality    THERAPY DIAG:  S/P total knee arthroplasty, left  Stiffness of left knee, not elsewhere classified  Acute pain of left knee  Localized edema  Muscle weakness (generalized)  Rationale for Evaluation and Treatment: Rehabilitation  ONSET DATE: 03/31/24  SUBJECTIVE:   SUBJECTIVE STATEMENT: Tired knee is hurting, been in the hospital caring for her husband since Wednesday.    PERTINENT HISTORY: R Hip arthroscopy with labral repair 07/2023 L TKA 03/31/24   PAIN:  Are you having pain? Yes: NPRS scale: 6/10, worse at end of day Pain location: calf pain, L knee Pain description: achy Aggravating factors: walking, standing  Relieving factors: icing  PRECAUTIONS: Knee  RED FLAGS: None   WEIGHT BEARING RESTRICTIONS: No  FALLS:  Has patient fallen in last 6 months? No  LIVING ENVIRONMENT: Lives with: lives with their spouse Lives in: House/apartment Stairs: Yes: Internal: 14 steps; on right going  up and on left going up Has following equipment at home: Single point cane  OCCUPATION: office manager   PLOF: Independent and Independent with gait  PATIENT GOALS: get back to PLOF and long hikes with 100lb dog   NEXT MD VISIT: 05/31/24  OBJECTIVE:  Note: Objective measures were completed at Evaluation unless otherwise noted.  DIAGNOSTIC FINDINGS: AP lateral radiographs left knee reviewed. Total knee prosthesis in good  position alignment with no complicating features    COGNITION: Overall cognitive status: Within functional  limits for tasks assessed     SENSATION: WFL, numb around the scar   EDEMA:  Swelling around L knee but pt reports it is significantly better   MUSCLE LENGTH: Some tightness in HS and calves   POSTURE: No Significant postural limitations  PALPATION: Some TTP and warm, scar has good healing   LOWER EXTREMITY ROM:  Active ROM Right eval Left eval Left 04/21/24 L  05/02/24 Left  05/09/24  Hip flexion       Hip extension       Hip abduction       Hip adduction       Hip internal rotation       Hip external rotation       Knee flexion  100 106 116 112  Knee extension  -3 0 0 0  Ankle dorsiflexion       Ankle plantarflexion       Ankle inversion       Ankle eversion        (Blank rows = not tested)  LOWER EXTREMITY MMT:  MMT Right eval Left eval  Hip flexion  4+  Hip extension    Hip abduction    Hip adduction    Hip internal rotation    Hip external rotation    Knee flexion  3  Knee extension  3  Ankle dorsiflexion    Ankle plantarflexion    Ankle inversion    Ankle eversion     (Blank rows = not tested)  FUNCTIONAL TESTS:  5 times sit to stand: 16s Timed up and go (TUG): 13s w/SPC, 14s w/o  GAIT: Distance walked: in clinic distances Assistive device utilized: Single point cane Level of assistance: Modified independence Comments: antalgic gait, using SPC                                                                                                                                TREATMENT DATE:  05/09/24 Nustep LE's only L5 x 6 min L knee PROM with end range holds  Patellar mobs LAQ LLE 3lb 3x10 LLE HS curls green 2x10   05/02/24:  Nustep LE's only L5 x 6 min Seated manual assisted stretching L knee flexion Seated SLR, and seated SLR with L LE ER 45 degrees, advanced from supine position for this specific ex. In ll bars, for lateral heel taps R off airex mass practice Standing heel/toe rocks  emphasis on position sense, balance when rocking  back Hamstring curls 20# 2 x 10 L LE only 10#  2 x 10 B knee ext 5# 2 x 10 B heel lifts on incline board, 2 x 10  Towel slides R for/back, side/side with L knee unlocked, mass practice to engage balance/proprioception L knee Seated manual stretch again L knee flexion x 5   04/28/24 Nustep L 5 LE only Resited gait  30# 5 x fwd and backward, 3 x each PROM left knee with end range stretch AROM   0-111 MMT ext 4,flexion 4+ Leg Press 30# 2 sets 10 BIL , left leg unlocked 10 x  Calf raises 20# 2 sets 12 HS curl BIL 20# 2 sets 10, 10 # Left only 10 x Knee ext 5# 10 BIL ( 10# was too heavy), 2nd set up with both lower with Left 3 x then stopped d/t struggling and pain , resume BIL 10 x Step up 5 x - educ to add to HEP  04/25/24 NuStep L 5 x 6 min L knee PROM with end range holds  Patellar mobs LAQ LLE 3lb 2x10 LLE HS curls green 2x10 6in step ups x10   Leg press 30lb 2x10    04/21/24 Nustep L 5 Green tband HS curl 2 sets 10 3# LAQ 2 sets 10 Standing HS curl 3# 2 sets 10 Standing 3# SLR and hip flex 10 x each STM to Left knee , quad and HS PROM to increase flexion. ROM taken after Stretches on steps for home- calf, knee flexion and HS Leg Press 20 # BIl 2 sets 10 then unlocked left only 10 x Leg press calf raises 20# 2 sets 10    04/18/24- EVAL, HEP    PATIENT EDUCATION:  Education details: POC, HEP review Person educated: Patient Education method: Explanation Education comprehension: verbalized understanding  HOME EXERCISE PROGRAM: Quad set, heel slides, SLR with ER and IR, LAQ, stair stretch, standing HS curl, heel raises, STS   ASSESSMENT:  CLINICAL IMPRESSION:  Pt enters with repots of fatigue. Her husband was in the hospital for 5 days and she has been caring for him. Shorten session per pr request and a lessened intensity. ROM remains well overall. Some pain reported with both passive and active interventions.  Cues for LE placement needed with sit to  stands. HS curls today with HS curls. Pt did become emotional during session today   Patient is a 43 y.o. female who was seen today for physical therapy evaluation and treatment for L TKA on 03/31/24. She is using the CPM 2-3x a day for about an hour. She is not going over 100d on it. Patient presents with weakness in her quads and hamstrings. She has been compliant with home exercises and is doing a great job icing, elevating, and keeping herself motivated. Patient will benefit from skilled PT to address her LE weakness, gait impairments, ROM deficits, etc to be able to return to PLOF and be able to hike and walk with her dog again.   OBJECTIVE IMPAIRMENTS: Abnormal gait, decreased balance, difficulty walking, decreased ROM, decreased strength, increased edema, and pain.   ACTIVITY LIMITATIONS: bending, sitting, standing, squatting, stairs, transfers, and locomotion level  PARTICIPATION LIMITATIONS: meal prep, cleaning, driving, shopping, community activity, occupation, and yard work  [facility] POTENTIAL: Excellent  CLINICAL DECISION MAKING: Stable/uncomplicated  EVALUATION COMPLEXITY: Low  GOALS: Goals reviewed with patient? Yes   SHORT TERM GOALS: Target date: 05/30/24  Independent with  initial HEP. Baseline:  Goal status: 04/21/24 MET  2.  Patient will demonstrate improved functional LE strength by completing 5x STS in <13 seconds w/o compensations Baseline: 16s Goal status: 04/28/24 MET   LONG TERM GOALS: Target date: 07/11/24  Independent with advanced/ongoing HEP to improve outcomes and carryover.  Baseline:  Goal status: INITIAL  2.  Patient will demonstrate L knee ROM 0-120 deg to ascend/descend stairs. Baseline: 3-0-100d with pain Goal status: INITIAL  3.  Patient will be able to ambulate 1000' safely with normal gait pattern to access community.  Baseline: antalgic gait, using SPC Goal status: INITIAL  4.  Patient will demonstrate LLE MMT 5/5  Baseline: 3/5 flexion  and ext Goal status: INITIAL  5.  Patient will be able to return to doing long walks and short hikes with her dog Baseline:  Goal status: INITIAL  PLAN:  PT FREQUENCY: 2x/week  PT DURATION: 12 weeks  PLANNED INTERVENTIONS: 97110-Therapeutic exercises, 97530- Therapeutic activity, 97112- Neuromuscular re-education, 97535- Self Care, 02859- Manual therapy, [id number]- Gait training, [id number]- Vasopneumatic device, Patient/Family education, Balance training, Stair training, Joint mobilization, Cryotherapy, and Moist heat  PLAN FOR NEXT SESSION: LLE strengthening, ROM for L knee, functional movements   Tanda KANDICE Sorrow, PTA, DPT, OCS 05/09/2024, 9:31 AM

## 2024-05-09 NOTE — Progress Notes (Signed)
 Post-Op Visit Note   Patient: Lisa Duran           Date of Birth: April 09, 1981           MRN: [id number] Visit Date: 05/09/2024 PCP: Geofm Glade PARAS, MD   Assessment & Plan:  Chief Complaint:  Chief Complaint  Patient presents with   Left Knee - Routine Post Op      Left TKA 03/31/24     Visit Diagnoses: No diagnosis found.  Plan: Lisa Duran is a patient is now 6 weeks out left total knee replacement.  On exam range of motion 0-1 12.  Husband had perfect colon and had surgery.  Taking Tylenol  and ibuprofen  for symptoms.  And physical therapy.  Plan at this time is to continue with physical therapy at [facility].  She is doing well with her rehab.  Okay to return to work in 2 weeks but no lifting more than 20 pounds for the first 6 weeks after she is back.  6-week return for final clinical recheck and release  Follow-Up Instructions: Return in about 6 weeks (around 06/20/2024).   Orders:  No orders of the defined types were placed in this encounter.  No orders of the defined types were placed in this encounter.   Imaging: No results found.  PMFS History: Patient Active Problem List   Diagnosis Date Noted   Status post left knee replacement 03/31/2024   Arthritis of left knee 11/20/2023   Tear of right acetabular labrum 07/21/2023   B12 deficiency 05/09/2021   Dyslipidemia 04/14/2019   Enteritis 01/12/2019   Sleep walking disorder 04/06/2017   Iron  deficiency anemia 03/27/2017   Fibromyalgia 02/03/2017   GERD (gastroesophageal reflux disease) 06/24/2015   Eczema 06/22/2015   Depression 06/22/2015   Tachycardia 06/16/2014   Food allergy    ADHD (attention deficit hyperactivity disorder) 01/27/2011   Anxiety 02/20/2010   Allergic rhinitis 11/21/2009   OVARIAN CYST 11/21/2009   Diabetes mellitus type 2 with neurological manifestations (HCC) 11/20/2009   Class 2 obesity in adult 11/20/2009   Migraine without aura 11/20/2009   Past Medical History:   Diagnosis Date   ADHD (attention deficit hyperactivity disorder)    ALLERGIC RHINITIS    Allergy    Anemia    ANXIETY    Arthritis    left knee   Complication of anesthesia    2017 propofol  for EGD chest tightness, SOB. 2024 surgery had nystagmus after surgery   Depression    DIABETES MELLITUS, TYPE II    Dysrhythmia    Sinus Tachycardia - On Metoprolol    Fibromyalgia 2018   GERD (gastroesophageal reflux disease)    History of hiatal hernia    small hernia found on scan   History of kidney stones    MIGRAINE, COMMON    Obesity, unspecified    OVARIAN CYST     Family History  Problem Relation Age of Onset   Diabetes Mother    Hyperlipidemia Mother    CAD Mother    CVA Mother    Diabetes Father    Hyperlipidemia Father    Coronary artery disease Father    Hypertension Father    Cancer Father        esoph   Stroke Father    Cancer Maternal Grandfather 77       Prostate and Lung   Diabetes Sister        Gestational   Graves' disease Maternal Aunt    Cancer  Maternal Uncle        Brain   Stroke Paternal Grandmother    Heart disease Paternal Grandmother    Stroke Paternal Grandfather    Heart disease Paternal Grandfather    Diabetes Sister        Gestational and Type II   Hashimoto's thyroiditis Cousin    Cancer Maternal Uncle        Brain    Past Surgical History:  Procedure Laterality Date   HIP ARTHROSCOPY W/ LABRAL REPAIR Right 07/2023   KNEE ARTHROSCOPY Left 08/25/2022   TOTAL KNEE ARTHROPLASTY Left 03/31/2024   Procedure: ARTHROPLASTY, KNEE, TOTAL;  Surgeon: Addie Cordella Hamilton, MD;  Location: [facility] OR;  Service: Orthopedics;  Laterality: Left;   Social History   Occupational History   Not on file  Tobacco Use   Smoking status: Former    Current packs/day: 0.00    Types: Cigarettes    Quit date: 08/20/2002    Years since quitting: 21.7   Smokeless tobacco: Never   Tobacco comments:    single, separated from spouse 02/2010.   Vaping Use   Vaping  status: Never Used  Substance and Sexual Activity   Alcohol use: Yes    Comment: maybe one drink per month   Drug use: No   Sexual activity: Not Currently    Birth control/protection: I.U.D.

## 2024-05-12 ENCOUNTER — Encounter: Payer: Self-pay | Admitting: Physical Therapy

## 2024-05-12 ENCOUNTER — Ambulatory Visit: Attending: Orthopedic Surgery | Admitting: Physical Therapy

## 2024-05-12 DIAGNOSIS — Z96652 Presence of left artificial knee joint: Secondary | ICD-10-CM | POA: Diagnosis present

## 2024-05-12 DIAGNOSIS — R600 Localized edema: Secondary | ICD-10-CM | POA: Insufficient documentation

## 2024-05-12 DIAGNOSIS — M25562 Pain in left knee: Secondary | ICD-10-CM | POA: Diagnosis present

## 2024-05-12 DIAGNOSIS — R262 Difficulty in walking, not elsewhere classified: Secondary | ICD-10-CM | POA: Insufficient documentation

## 2024-05-12 DIAGNOSIS — M25662 Stiffness of left knee, not elsewhere classified: Secondary | ICD-10-CM | POA: Insufficient documentation

## 2024-05-12 DIAGNOSIS — M6281 Muscle weakness (generalized): Secondary | ICD-10-CM | POA: Diagnosis present

## 2024-05-12 NOTE — Therapy (Signed)
 OUTPATIENT PHYSICAL THERAPY LOWER EXTREMITY    Patient Name: Lisa Duran MRN: [id number] DOB:[birth year], 43 y.o., female Today's Date: 05/12/2024  END OF SESSION:  PT End of Session - 05/12/24 0932     Visit Number 7    Date for Recertification  07/11/24    PT Start Time 0932    PT Stop Time 1015    PT Time Calculation (min) 43 min    Activity Tolerance Patient tolerated treatment well    Behavior During Therapy [facility] for tasks assessed/performed           Past Medical History:  Diagnosis Date   ADHD (attention deficit hyperactivity disorder)    ALLERGIC RHINITIS    Allergy    Anemia    ANXIETY    Arthritis    left knee   Complication of anesthesia    2017 propofol  for EGD chest tightness, SOB. 2024 surgery had nystagmus after surgery   Depression    DIABETES MELLITUS, TYPE II    Dysrhythmia    Sinus Tachycardia - On Metoprolol    Fibromyalgia 2018   GERD (gastroesophageal reflux disease)    History of hiatal hernia    small hernia found on scan   History of kidney stones    MIGRAINE, COMMON    Obesity, unspecified    OVARIAN CYST    Past Surgical History:  Procedure Laterality Date   HIP ARTHROSCOPY W/ LABRAL REPAIR Right 07/2023   KNEE ARTHROSCOPY Left 08/25/2022   TOTAL KNEE ARTHROPLASTY Left 03/31/2024   Procedure: ARTHROPLASTY, KNEE, TOTAL;  Surgeon: Addie Cordella Hamilton, MD;  Location: [facility] OR;  Service: Orthopedics;  Laterality: Left;   Patient Active Problem List   Diagnosis Date Noted   Status post left knee replacement 03/31/2024   Arthritis of left knee 11/20/2023   Tear of right acetabular labrum 07/21/2023   B12 deficiency 05/09/2021   Dyslipidemia 04/14/2019   Enteritis 01/12/2019   Sleep walking disorder 04/06/2017   Iron  deficiency anemia 03/27/2017   Fibromyalgia 02/03/2017   GERD (gastroesophageal reflux disease) 06/24/2015   Eczema 06/22/2015   Depression 06/22/2015   Tachycardia 06/16/2014   Food allergy    ADHD  (attention deficit hyperactivity disorder) 01/27/2011   Anxiety 02/20/2010   Allergic rhinitis 11/21/2009   OVARIAN CYST 11/21/2009   Diabetes mellitus type 2 with neurological manifestations (HCC) 11/20/2009   Class 2 obesity in adult 11/20/2009   Migraine without aura 11/20/2009    PCP: Glade Hope  REFERRING PROVIDER: Cordella Addie  REFERRING DIAG:  [phone] (ICD-10-CM) - History of total knee replacement, unspecified laterality    THERAPY DIAG:  S/P total knee arthroplasty, left  Stiffness of left knee, not elsewhere classified  Acute pain of left knee  Localized edema  Muscle weakness (generalized)  Rationale for Evaluation and Treatment: Rehabilitation  ONSET DATE: 03/31/24  SUBJECTIVE:   SUBJECTIVE STATEMENT: Feeling a lot better today. Pt enters ambulating without AD   PERTINENT HISTORY: R Hip arthroscopy with labral repair 07/2023 L TKA 03/31/24   PAIN:  Are you having pain? Yes: NPRS scale: 3/10 Pain location: calf pain, L knee Pain description: achy Aggravating factors: walking, standing  Relieving factors: icing  PRECAUTIONS: Knee  RED FLAGS: None   WEIGHT BEARING RESTRICTIONS: No  FALLS:  Has patient fallen in last 6 months? No  LIVING ENVIRONMENT: Lives with: lives with their spouse Lives in: House/apartment Stairs: Yes: Internal: 14 steps; on right going up and on left going up Has following equipment at  home: Single point cane  OCCUPATION: office manager   PLOF: Independent and Independent with gait  PATIENT GOALS: get back to PLOF and long hikes with 100lb dog   NEXT MD VISIT: 05/31/24  OBJECTIVE:  Note: Objective measures were completed at Evaluation unless otherwise noted.  DIAGNOSTIC FINDINGS: AP lateral radiographs left knee reviewed. Total knee prosthesis in good  position alignment with no complicating features    COGNITION: Overall cognitive status: Within functional limits for tasks assessed     SENSATION: WFL,  numb around the scar   EDEMA:  Swelling around L knee but pt reports it is significantly better   MUSCLE LENGTH: Some tightness in HS and calves   POSTURE: No Significant postural limitations  PALPATION: Some TTP and warm, scar has good healing   LOWER EXTREMITY ROM:  Active ROM Right eval Left eval Left 04/21/24 L  05/02/24 Left  05/09/24  Hip flexion       Hip extension       Hip abduction       Hip adduction       Hip internal rotation       Hip external rotation       Knee flexion  100 106 116 112  Knee extension  -3 0 0 0  Ankle dorsiflexion       Ankle plantarflexion       Ankle inversion       Ankle eversion        (Blank rows = not tested)  LOWER EXTREMITY MMT:  MMT Right eval Left eval  Hip flexion  4+  Hip extension    Hip abduction    Hip adduction    Hip internal rotation    Hip external rotation    Knee flexion  3  Knee extension  3  Ankle dorsiflexion    Ankle plantarflexion    Ankle inversion    Ankle eversion     (Blank rows = not tested)  FUNCTIONAL TESTS:  5 times sit to stand: 16s Timed up and go (TUG): 13s w/SPC, 14s w/o  GAIT: Distance walked: in clinic distances Assistive device utilized: Single point cane Level of assistance: Modified independence Comments: antalgic gait, using SPC                                                                                                                                TREATMENT DATE:  05/12/24 Nustep L5 x 6 min 4in step ups x10 each 8in step ups x10 each  30lb resisted gait 4 way x 3 each L knee PROM with end range holds  Patellar mobs Sit to stand holding yellow ball 2x10  LAQ LLE 3lb 2x10   05/09/24 Nustep LE's only L5 x 6 min L knee PROM with end range holds  Patellar mobs LAQ LLE 3lb 3x10 LLE HS curls green 2x10   05/02/24:  Nustep LE's only L5 x 6 min Seated manual  assisted stretching L knee flexion Seated SLR, and seated SLR with L LE ER 45 degrees, advanced from  supine position for this specific ex. In ll bars, for lateral heel taps R off airex mass practice Standing heel/toe rocks emphasis on position sense, balance when rocking back Hamstring curls 20# 2 x 10 L LE only 10#  2 x 10 B knee ext 5# 2 x 10 B heel lifts on incline board, 2 x 10  Towel slides R for/back, side/side with L knee unlocked, mass practice to engage balance/proprioception L knee Seated manual stretch again L knee flexion x 5   04/28/24 Nustep L 5 LE only Resited gait  30# 5 x fwd and backward, 3 x each PROM left knee with end range stretch AROM   0-111 MMT ext 4,flexion 4+ Leg Press 30# 2 sets 10 BIL , left leg unlocked 10 x  Calf raises 20# 2 sets 12 HS curl BIL 20# 2 sets 10, 10 # Left only 10 x Knee ext 5# 10 BIL ( 10# was too heavy), 2nd set up with both lower with Left 3 x then stopped d/t struggling and pain , resume BIL 10 x Step up 5 x - educ to add to HEP  04/25/24 NuStep L 5 x 6 min L knee PROM with end range holds  Patellar mobs LAQ LLE 3lb 2x10 LLE HS curls green 2x10 6in step ups x10   Leg press 30lb 2x10    04/21/24 Nustep L 5 Green tband HS curl 2 sets 10 3# LAQ 2 sets 10 Standing HS curl 3# 2 sets 10 Standing 3# SLR and hip flex 10 x each STM to Left knee , quad and HS PROM to increase flexion. ROM taken after Stretches on steps for home- calf, knee flexion and HS Leg Press 20 # BIl 2 sets 10 then unlocked left only 10 x Leg press calf raises 20# 2 sets 10    04/18/24- EVAL, HEP    PATIENT EDUCATION:  Education details: POC, HEP review Person educated: Patient Education method: Explanation Education comprehension: verbalized understanding  HOME EXERCISE PROGRAM: Quad set, heel slides, SLR with ER and IR, LAQ, stair stretch, standing HS curl, heel raises, STS   ASSESSMENT:  CLINICAL IMPRESSION:  Pt enters doing well. She is progressing overall. She reports difficulty with stairs at home.  Session focused on functional  interventions. Some hesitation with step ups, but able to complete. Some pain with end range passive flexion. All interventions completed with good effort.   Patient is a 43 y.o. female who was seen today for physical therapy evaluation and treatment for L TKA on 03/31/24. She is using the CPM 2-3x a day for about an hour. She is not going over 100d on it. Patient presents with weakness in her quads and hamstrings. She has been compliant with home exercises and is doing a great job icing, elevating, and keeping herself motivated. Patient will benefit from skilled PT to address her LE weakness, gait impairments, ROM deficits, etc to be able to return to PLOF and be able to hike and walk with her dog again.   OBJECTIVE IMPAIRMENTS: Abnormal gait, decreased balance, difficulty walking, decreased ROM, decreased strength, increased edema, and pain.   ACTIVITY LIMITATIONS: bending, sitting, standing, squatting, stairs, transfers, and locomotion level  PARTICIPATION LIMITATIONS: meal prep, cleaning, driving, shopping, community activity, occupation, and yard work  [facility] POTENTIAL: Excellent  CLINICAL DECISION MAKING: Stable/uncomplicated  EVALUATION COMPLEXITY: Low  GOALS: Goals  reviewed with patient? Yes   SHORT TERM GOALS: Target date: 05/30/24  Independent with initial HEP. Baseline:  Goal status: 04/21/24 MET  2.  Patient will demonstrate improved functional LE strength by completing 5x STS in <13 seconds w/o compensations Baseline: 16s Goal status: 04/28/24 MET   LONG TERM GOALS: Target date: 07/11/24  Independent with advanced/ongoing HEP to improve outcomes and carryover.  Baseline:  Goal status: INITIAL  2.  Patient will demonstrate L knee ROM 0-120 deg to ascend/descend stairs. Baseline: 3-0-100d with pain Goal status: INITIAL  3.  Patient will be able to ambulate 1000' safely with normal gait pattern to access community.  Baseline: antalgic gait, using SPC Goal status:  INITIAL  4.  Patient will demonstrate LLE MMT 5/5  Baseline: 3/5 flexion and ext Goal status: INITIAL  5.  Patient will be able to return to doing long walks and short hikes with her dog Baseline:  Goal status: progressing a little 05/12/24  PLAN:  PT FREQUENCY: 2x/week  PT DURATION: 12 weeks  PLANNED INTERVENTIONS: 97110-Therapeutic exercises, 97530- Therapeutic activity, 97112- Neuromuscular re-education, 97535- Self Care, [id number]- Manual therapy, [phone]- Gait training, [phone]- Vasopneumatic device, Patient/Family education, Balance training, Stair training, Joint mobilization, Cryotherapy, and Moist heat  PLAN FOR NEXT SESSION: LLE strengthening, ROM for L knee, functional movements   Tanda KANDICE Sorrow, PTA, DPT, OCS 05/12/2024, 9:32 AM

## 2024-05-17 ENCOUNTER — Ambulatory Visit: Admitting: Physical Therapy

## 2024-05-24 ENCOUNTER — Ambulatory Visit: Admitting: Physical Therapy

## 2024-05-24 ENCOUNTER — Encounter: Payer: Self-pay | Admitting: Physical Therapy

## 2024-05-24 DIAGNOSIS — Z96652 Presence of left artificial knee joint: Secondary | ICD-10-CM | POA: Diagnosis not present

## 2024-05-24 DIAGNOSIS — R262 Difficulty in walking, not elsewhere classified: Secondary | ICD-10-CM

## 2024-05-24 DIAGNOSIS — M6281 Muscle weakness (generalized): Secondary | ICD-10-CM

## 2024-05-24 DIAGNOSIS — R600 Localized edema: Secondary | ICD-10-CM

## 2024-05-24 DIAGNOSIS — M25662 Stiffness of left knee, not elsewhere classified: Secondary | ICD-10-CM

## 2024-05-24 DIAGNOSIS — M25562 Pain in left knee: Secondary | ICD-10-CM

## 2024-05-24 NOTE — Therapy (Signed)
 OUTPATIENT PHYSICAL THERAPY LOWER EXTREMITY    Patient Name: Lisa Duran MRN: [id number] DOB:03-Jan-1981, 43 y.o., female Today's Date: 05/24/2024  END OF SESSION:  PT End of Session - 05/24/24 0931     Visit Number 8    Date for Recertification  07/11/24    PT Start Time 0930    PT Stop Time 1015    PT Time Calculation (min) 45 min    Activity Tolerance Patient tolerated treatment well    Behavior During Therapy [facility] for tasks assessed/performed           Past Medical History:  Diagnosis Date   ADHD (attention deficit hyperactivity disorder)    ALLERGIC RHINITIS    Allergy    Anemia    ANXIETY    Arthritis    left knee   Complication of anesthesia    2017 propofol  for EGD chest tightness, SOB. 2024 surgery had nystagmus after surgery   Depression    DIABETES MELLITUS, TYPE II    Dysrhythmia    Sinus Tachycardia - On Metoprolol    Fibromyalgia 2018   GERD (gastroesophageal reflux disease)    History of hiatal hernia    small hernia found on scan   History of kidney stones    MIGRAINE, COMMON    Obesity, unspecified    OVARIAN CYST    Past Surgical History:  Procedure Laterality Date   HIP ARTHROSCOPY W/ LABRAL REPAIR Right 07/2023   KNEE ARTHROSCOPY Left 08/25/2022   TOTAL KNEE ARTHROPLASTY Left 03/31/2024   Procedure: ARTHROPLASTY, KNEE, TOTAL;  Surgeon: Addie Cordella Hamilton, MD;  Location: [facility] OR;  Service: Orthopedics;  Laterality: Left;   Patient Active Problem List   Diagnosis Date Noted   Status post left knee replacement 03/31/2024   Arthritis of left knee 11/20/2023   Tear of right acetabular labrum 07/21/2023   B12 deficiency 05/09/2021   Dyslipidemia 04/14/2019   Enteritis 01/12/2019   Sleep walking disorder 04/06/2017   Iron  deficiency anemia 03/27/2017   Fibromyalgia 02/03/2017   GERD (gastroesophageal reflux disease) 06/24/2015   Eczema 06/22/2015   Depression 06/22/2015   Tachycardia 06/16/2014   Food allergy    ADHD  (attention deficit hyperactivity disorder) 01/27/2011   Anxiety 02/20/2010   Allergic rhinitis 11/21/2009   OVARIAN CYST 11/21/2009   Diabetes mellitus type 2 with neurological manifestations (HCC) 11/20/2009   Class 2 obesity in adult 11/20/2009   Migraine without aura 11/20/2009    PCP: Glade Hope  REFERRING PROVIDER: Cordella Addie  REFERRING DIAG:  [phone] (ICD-10-CM) - History of total knee replacement, unspecified laterality    THERAPY DIAG:  S/P total knee arthroplasty, left  Stiffness of left knee, not elsewhere classified  Acute pain of left knee  Localized edema  Difficulty in walking, not elsewhere classified  Muscle weakness (generalized)  Rationale for Evaluation and Treatment: Rehabilitation  ONSET DATE: 03/31/24  SUBJECTIVE:   SUBJECTIVE STATEMENT: Some swelling in the L knee also some L hip pain   PERTINENT HISTORY: R Hip arthroscopy with labral repair 07/2023 L TKA 03/31/24   PAIN:  Are you having pain? Yes: NPRS scale: 4/10 Pain location: L hip Pain description: achy Aggravating factors: walking, standing  Relieving factors: icing  PRECAUTIONS: Knee  RED FLAGS: None   WEIGHT BEARING RESTRICTIONS: No  FALLS:  Has patient fallen in last 6 months? No  LIVING ENVIRONMENT: Lives with: lives with their spouse Lives in: House/apartment Stairs: Yes: Internal: 14 steps; on right going up and on left  going up Has following equipment at home: Single point cane  OCCUPATION: office manager   PLOF: Independent and Independent with gait  PATIENT GOALS: get back to PLOF and long hikes with 100lb dog   NEXT MD VISIT: 05/31/24  OBJECTIVE:  Note: Objective measures were completed at Evaluation unless otherwise noted.  DIAGNOSTIC FINDINGS: AP lateral radiographs left knee reviewed. Total knee prosthesis in good  position alignment with no complicating features    COGNITION: Overall cognitive status: Within functional limits for tasks  assessed     SENSATION: WFL, numb around the scar   EDEMA:  Swelling around L knee but pt reports it is significantly better   MUSCLE LENGTH: Some tightness in HS and calves   POSTURE: No Significant postural limitations  PALPATION: Some TTP and warm, scar has good healing   LOWER EXTREMITY ROM:  Active ROM Right eval Left eval Left 04/21/24 L  05/02/24 Left  05/09/24 Left 05/24/24  Hip flexion        Hip extension        Hip abduction        Hip adduction        Hip internal rotation        Hip external rotation        Knee flexion  100 106 116 112 116  Knee extension  -3 0 0 0 0  Ankle dorsiflexion        Ankle plantarflexion        Ankle inversion        Ankle eversion         (Blank rows = not tested)  LOWER EXTREMITY MMT:  MMT Right eval Left eval  Hip flexion  4+  Hip extension    Hip abduction    Hip adduction    Hip internal rotation    Hip external rotation    Knee flexion  3  Knee extension  3  Ankle dorsiflexion    Ankle plantarflexion    Ankle inversion    Ankle eversion     (Blank rows = not tested)  FUNCTIONAL TESTS:  5 times sit to stand: 16s Timed up and go (TUG): 13s w/SPC, 14s w/o  GAIT: Distance walked: in clinic distances Assistive device utilized: Single point cane Level of assistance: Modified independence Comments: antalgic gait, using SPC                                                                                                                                TREATMENT DATE:  05/24/24 NuStep L 5 x 5 min Bike L2 x 3 min Leg press 20lb 2x10, Heel raises 10lb 2x10  L knee pain during second set of heel raised  L knee PROM with end range holds  Patellar mobs LAQ LLE 3lb 2x10 Sit to stand yellow x10  05/12/24 Nustep L5 x 6 min 4in step ups x10 each 8in step ups x10 each  30lb  resisted gait 4 way x 3 each L knee PROM with end range holds  Patellar mobs Sit to stand holding yellow ball 2x10  LAQ LLE 3lb 2x10    05/09/24 Nustep LE's only L5 x 6 min L knee PROM with end range holds  Patellar mobs LAQ LLE 3lb 3x10 LLE HS curls green 2x10   05/02/24:  Nustep LE's only L5 x 6 min Seated manual assisted stretching L knee flexion Seated SLR, and seated SLR with L LE ER 45 degrees, advanced from supine position for this specific ex. In ll bars, for lateral heel taps R off airex mass practice Standing heel/toe rocks emphasis on position sense, balance when rocking back Hamstring curls 20# 2 x 10 L LE only 10#  2 x 10 B knee ext 5# 2 x 10 B heel lifts on incline board, 2 x 10  Towel slides R for/back, side/side with L knee unlocked, mass practice to engage balance/proprioception L knee Seated manual stretch again L knee flexion x 5   04/28/24 Nustep L 5 LE only Resited gait  30# 5 x fwd and backward, 3 x each PROM left knee with end range stretch AROM   0-111 MMT ext 4,flexion 4+ Leg Press 30# 2 sets 10 BIL , left leg unlocked 10 x  Calf raises 20# 2 sets 12 HS curl BIL 20# 2 sets 10, 10 # Left only 10 x Knee ext 5# 10 BIL ( 10# was too heavy), 2nd set up with both lower with Left 3 x then stopped d/t struggling and pain , resume BIL 10 x Step up 5 x - educ to add to HEP  04/25/24 NuStep L 5 x 6 min L knee PROM with end range holds  Patellar mobs LAQ LLE 3lb 2x10 LLE HS curls green 2x10 6in step ups x10   Leg press 30lb 2x10    04/21/24 Nustep L 5 Green tband HS curl 2 sets 10 3# LAQ 2 sets 10 Standing HS curl 3# 2 sets 10 Standing 3# SLR and hip flex 10 x each STM to Left knee , quad and HS PROM to increase flexion. ROM taken after Stretches on steps for home- calf, knee flexion and HS Leg Press 20 # BIl 2 sets 10 then unlocked left only 10 x Leg press calf raises 20# 2 sets 10    04/18/24- EVAL, HEP    PATIENT EDUCATION:  Education details: POC, HEP review Person educated: Patient Education method: Explanation Education comprehension: verbalized  understanding  HOME EXERCISE PROGRAM: Quad set, heel slides, SLR with ER and IR, LAQ, stair stretch, standing HS curl, heel raises, STS   ASSESSMENT:  CLINICAL IMPRESSION:  Pt enters reporting increase L knee and hip pain. ROM remains well.  Session focused on intervention trying to limit pain. Increase pain with heel raises and sit to stands.  Ballotable patella remains due to swelling.  All interventions completed with good effort.   Patient is a 43 y.o. female who was seen today for physical therapy evaluation and treatment for L TKA on 03/31/24. She is using the CPM 2-3x a day for about an hour. She is not going over 100d on it. Patient presents with weakness in her quads and hamstrings. She has been compliant with home exercises and is doing a great job icing, elevating, and keeping herself motivated. Patient will benefit from skilled PT to address her LE weakness, gait impairments, ROM deficits, etc to be able to return to PLOF  and be able to hike and walk with her dog again.   OBJECTIVE IMPAIRMENTS: Abnormal gait, decreased balance, difficulty walking, decreased ROM, decreased strength, increased edema, and pain.   ACTIVITY LIMITATIONS: bending, sitting, standing, squatting, stairs, transfers, and locomotion level  PARTICIPATION LIMITATIONS: meal prep, cleaning, driving, shopping, community activity, occupation, and yard work  [facility] POTENTIAL: Excellent  CLINICAL DECISION MAKING: Stable/uncomplicated  EVALUATION COMPLEXITY: Low  GOALS: Goals reviewed with patient? Yes   SHORT TERM GOALS: Target date: 05/30/24  Independent with initial HEP. Baseline:  Goal status: 04/21/24 MET  2.  Patient will demonstrate improved functional LE strength by completing 5x STS in <13 seconds w/o compensations Baseline: 16s Goal status: 04/28/24 MET   LONG TERM GOALS: Target date: 07/11/24  Independent with advanced/ongoing HEP to improve outcomes and carryover.  Baseline:  Goal status:  INITIAL  2.  Patient will demonstrate L knee ROM 0-120 deg to ascend/descend stairs. Baseline: 3-0-100d with pain Goal status: INITIAL  3.  Patient will be able to ambulate 1000' safely with normal gait pattern to access community.  Baseline: antalgic gait, using SPC Goal status: INITIAL  4.  Patient will demonstrate LLE MMT 5/5  Baseline: 3/5 flexion and ext Goal status: INITIAL  5.  Patient will be able to return to doing long walks and short hikes with her dog Baseline:  Goal status: progressing a little 05/12/24  PLAN:  PT FREQUENCY: 2x/week  PT DURATION: 12 weeks  PLANNED INTERVENTIONS: 97110-Therapeutic exercises, 97530- Therapeutic activity, 97112- Neuromuscular re-education, 97535- Self Care, 02859- Manual therapy, [phone]- Gait training, [phone]- Vasopneumatic device, Patient/Family education, Balance training, Stair training, Joint mobilization, Cryotherapy, and Moist heat  PLAN FOR NEXT SESSION: LLE strengthening, ROM for L knee, functional movements   Tanda KANDICE Sorrow, PTA, DPT, OCS 05/24/2024, 9:32 AM

## 2024-05-26 ENCOUNTER — Ambulatory Visit: Admitting: Physical Therapy

## 2024-05-29 ENCOUNTER — Encounter: Payer: Self-pay | Admitting: Internal Medicine

## 2024-05-29 NOTE — Progress Notes (Unsigned)
 Subjective:    Patient ID: Lisa Duran, female    DOB: 1981/02/20, 43 y.o.   MRN: [id number]      HPI Lisa Duran is here for a Physical exam and her chronic medical problems.   S/p L TKR - 2 months ago --knee joint still swells.  Pain improving daily.  ROM is good.  Doing PT. taking Tylenol  and some ibuprofen -typically at night.  Has had some increase stress.  This and the knee are likely affecting her sleep and has caused some fatigue and fibromyalgia flares.    Ozempic  increased to 1 mg -- improved sugars after that.  Helps with appetite.    Medications and allergies reviewed with patient and updated if appropriate.  Current Outpatient Medications on File Prior to Visit  Medication Sig Dispense Refill   ALPRAZolam  (XANAX ) 0.5 MG tablet TAKE 1 TABLET(0.5 MG) BY MOUTH THREE TIMES DAILY AS NEEDED FOR ANXIETY 60 tablet 5   amphetamine -dextroamphetamine  (ADDERALL ) 20 MG tablet Take 1 tablet (20 mg total) by mouth 2 (two) times daily. 180 tablet 0   cyanocobalamin  (VITAMIN B12) 1000 MCG/ML injection INJECT INTRAMUSCULARLY 1 ML  EVERY 30 DAYS (DISCARD 28 DAYS  AFTER FIRST USE) 3 mL 2   EPINEPHrine  0.3 mg/0.3 mL IJ SOAJ injection Use as needed for hypersensitivity reaction 1 each 0   fexofenadine (ALLEGRA) 60 MG tablet Take 60 mg by mouth daily as needed (Allergies).     glucose blood (ONETOUCH VERIO) test strip Use 2x a day 200 each 3   levonorgestrel  (MIRENA ) 20 MCG/24HR IUD 1 Intra Uterine Device (1 each total) by Intrauterine route once. 1 each 0   metFORMIN  (GLUCOPHAGE ) 1000 MG tablet TAKE 2 TABLETS BY MOUTH DAILY WITH SUPPER. 180 tablet 3   metoprolol  succinate (TOPROL -XL) 25 MG 24 hr tablet TAKE 1 TABLET BY MOUTH DAILY 90 tablet 3   omeprazole  (PRILOSEC) 40 MG capsule TAKE 1 CAPSULE BY MOUTH DAILY BEFORE SUPPER 90 capsule 2   ondansetron  (ZOFRAN -ODT) 4 MG disintegrating tablet Take 1 tablet (4 mg total) by mouth every 8 (eight) hours as needed for nausea or vomiting.  20 tablet 0   promethazine  (PHENERGAN ) 25 MG tablet Take 1 tablet (25 mg total) by mouth every 6 (six) hours as needed for nausea. 15 tablet 0   rizatriptan  (MAXALT ) 10 MG tablet Take 1 tablet (10 mg total) by mouth as needed for migraine. May repeat in 2 hours if needed 10 tablet 8   rosuvastatin  (CRESTOR ) 5 MG tablet TAKE 1 TABLET(5 MG) BY MOUTH 2 TIMES A WEEK 13 tablet 3   Semaglutide , 1 MG/DOSE, (OZEMPIC , 1 MG/DOSE,) 4 MG/3ML SOPN Inject 1 mg into the skin once a week. 9 mL 3   SYRINGE-NEEDLE, DISP, 3 ML 25G X 1 3 ML MISC Use for monthly B12 injections 12 each 1   triamcinolone  cream (KENALOG ) 0.1 % Apply 1 application topically 2 (two) times daily as needed (for itching). 28.4 g 1   No current facility-administered medications on file prior to visit.    Review of Systems  Constitutional:  Negative for fever.  HENT:  Positive for trouble swallowing (occ food getting stuck at times).   Eyes:  Negative for visual disturbance.  Respiratory:  Positive for shortness of breath (oc c- fatigue, stress related). Negative for cough and wheezing.   Cardiovascular:  Positive for leg swelling (minimal). Negative for chest pain (fibromyalgia pain) and palpitations.  Gastrointestinal:  Negative for abdominal pain, blood in stool, constipation and  diarrhea.       No gerd  Genitourinary:  Negative for dysuria.  Musculoskeletal:  Positive for arthralgias (fibro related joint pain - intermittent) and back pain (lower back).  Skin:  Negative for rash (a couple of episodes of hives - ? stress or allergy).  Neurological:  Positive for light-headedness (if stands too quick or if standing too long - a couple of times a week) and headaches.  Psychiatric/Behavioral:  Positive for dysphoric mood. The patient is nervous/anxious.        Objective:   Vitals:   05/31/24 0919  BP: 116/74  Pulse: 76  Temp: 98.2 F (36.8 C)  SpO2: 98%   Filed Weights   05/31/24 0919  Weight: 179 lb (81.2 kg)   Body mass  index is 30.73 kg/m.  BP Readings from Last 3 Encounters:  05/31/24 116/74  04/01/24 103/72  03/29/24 104/77    Wt Readings from Last 3 Encounters:  05/31/24 179 lb (81.2 kg)  03/31/24 189 lb (85.7 kg)  03/29/24 189 lb (85.7 kg)       Physical Exam Constitutional: She appears well-developed and well-nourished. No distress.  HENT:  Head: Normocephalic and atraumatic.  Right Ear: External ear normal. Normal ear canal and TM Left Ear: External ear normal.  Normal ear canal and TM Mouth/Throat: Oropharynx is clear and moist.  Eyes: Conjunctivae normal.  Neck: Neck supple. No tracheal deviation present. No thyromegaly present.  No carotid bruit  Cardiovascular: Normal rate, regular rhythm and normal heart sounds.   No murmur heard.  No edema. Pulmonary/Chest: Effort normal and breath sounds normal. No respiratory distress. She has no wheezes. She has no rales.  Breast: deferred   Abdominal: Soft. She exhibits no distension. There is no tenderness.  Lymphadenopathy: She has no cervical adenopathy.  Skin: Skin is warm and dry. She is not diaphoretic.  Psychiatric: She has a normal mood and affect. Her behavior is normal.     Lab Results  Component Value Date   WBC 8.6 03/29/2024   HGB 13.1 03/29/2024   HCT 42.0 03/29/2024   PLT 348 03/29/2024   GLUCOSE 110 (H) 03/29/2024   CHOL 171 11/20/2023   TRIG 97.0 11/20/2023   HDL 77.70 11/20/2023   LDLCALC 74 11/20/2023   ALT 10 11/20/2023   AST 15 11/20/2023   NA 135 03/29/2024   K 3.6 03/29/2024   CL 100 03/29/2024   CREATININE 0.69 03/29/2024   BUN 6 03/29/2024   CO2 24 03/29/2024   TSH 0.64 05/22/2023   INR 1.1 01/11/2019   HGBA1C 6.7 (H) 03/29/2024   MICROALBUR 0.3 03/10/2024         Assessment & Plan:   Physical exam: Screening blood work  ordered Exercise  doing PT - recovering from TKR Weight  obese - working on weight loss Substance abuse  none   Reviewed recommended immunizations.  Flu immunization  administered today.     Health Maintenance  Topic Date Due   Cervical Cancer Screening (HPV/Pap Cotest)  10/12/2015   Mammogram  05/03/2020   OPHTHALMOLOGY EXAM  07/27/2021   Colonoscopy  Never done   COVID-19 Vaccine (2 - Moderna risk series) 06/16/2024 (Originally 12/02/2019)   HPV VACCINES (1 - Risk 3-dose SCDM series) 05/31/2025 (Originally 10/06/2005)   HEMOGLOBIN A1C  09/29/2024   FOOT EXAM  11/03/2024   Diabetic kidney evaluation - Urine ACR  03/10/2025   Diabetic kidney evaluation - eGFR measurement  03/29/2025   DTaP/Tdap/Td (4 - Td or  Tdap) 02/24/2028   Pneumococcal Vaccine  Completed   Influenza Vaccine  Completed   HIV Screening  Completed   Meningococcal B Vaccine  Aged Out   Hepatitis B Vaccines 19-59 Average Risk  Discontinued   Hepatitis C Screening  Discontinued     Not up-to-date with mammogram or Pap smear-Will schedule.  Colonoscopy scheduled.     See Problem List for Assessment and Plan of chronic medical problems.

## 2024-05-29 NOTE — Patient Instructions (Addendum)
 Flu immunization administered today.      Medications changes include :   flexeril 5 mg at bedtime as needed and duloxetine  20 mg daily     Return in about 6 months (around 11/29/2024) for follow up.   Health Maintenance, Female Adopting a healthy lifestyle and getting preventive care are important in promoting health and wellness. Ask your health care provider about: The right schedule for you to have regular tests and exams. Things you can do on your own to prevent diseases and keep yourself healthy. What should I know about diet, weight, and exercise? Eat a healthy diet  Eat a diet that includes plenty of vegetables, fruits, low-fat dairy products, and lean protein. Do not eat a lot of foods that are high in solid fats, added sugars, or sodium. Maintain a healthy weight Body mass index (BMI) is used to identify weight problems. It estimates body fat based on height and weight. Your health care provider can help determine your BMI and help you achieve or maintain a healthy weight. Get regular exercise Get regular exercise. This is one of the most important things you can do for your health. Most adults should: Exercise for at least 150 minutes each week. The exercise should increase your heart rate and make you sweat (moderate-intensity exercise). Do strengthening exercises at least twice a week. This is in addition to the moderate-intensity exercise. Spend less time sitting. Even light physical activity can be beneficial. Watch cholesterol and blood lipids Have your blood tested for lipids and cholesterol at 45 years of age, then have this test every 5 years. Have your cholesterol levels checked more often if: Your lipid or cholesterol levels are high. You are older than 45 years of age. You are at high risk for heart disease. What should I know about cancer screening? Depending on your health history and family history, you may need to have cancer screening at various  ages. This may include screening for: Breast cancer. Cervical cancer. Colorectal cancer. Skin cancer. Lung cancer. What should I know about heart disease, diabetes, and high blood pressure? Blood pressure and heart disease High blood pressure causes heart disease and increases the risk of stroke. This is more likely to develop in people who have high blood pressure readings or are overweight. Have your blood pressure checked: Every 3-5 years if you are 38-61 years of age. Every year if you are 9 years old or older. Diabetes Have regular diabetes screenings. This checks your fasting blood sugar level. Have the screening done: Once every three years after age 104 if you are at a normal weight and have a low risk for diabetes. More often and at a younger age if you are overweight or have a high risk for diabetes. What should I know about preventing infection? Hepatitis B If you have a higher risk for hepatitis B, you should be screened for this virus. Talk with your health care provider to find out if you are at risk for hepatitis B infection. Hepatitis C Testing is recommended for: Everyone born from [birth year] through 1967. Anyone with known risk factors for hepatitis C. Sexually transmitted infections (STIs) Get screened for STIs, including gonorrhea and chlamydia, if: You are sexually active and are younger than 45 years of age. You are older than 45 years of age and your health care provider tells you that you are at risk for this type of infection. Your sexual activity has changed since you were last screened, and you  are at increased risk for chlamydia or gonorrhea. Ask your health care provider if you are at risk. Ask your health care provider about whether you are at high risk for HIV. Your health care provider may recommend a prescription medicine to help prevent HIV infection. If you choose to take medicine to prevent HIV, you should first get tested for HIV. You should then be tested  every 3 months for as long as you are taking the medicine. Pregnancy If you are about to stop having your period (premenopausal) and you may become pregnant, seek counseling before you get pregnant. Take 400 to 800 micrograms (mcg) of folic acid  every day if you become pregnant. Ask for birth control (contraception) if you want to prevent pregnancy. Osteoporosis and menopause Osteoporosis is a disease in which the bones lose minerals and strength with aging. This can result in bone fractures. If you are 36 years old or older, or if you are at risk for osteoporosis and fractures, ask your health care provider if you should: Be screened for bone loss. Take a calcium  or vitamin D supplement to lower your risk of fractures. Be given hormone replacement therapy (HRT) to treat symptoms of menopause. Follow these instructions at home: Alcohol use Do not drink alcohol if: Your health care provider tells you not to drink. You are pregnant, may be pregnant, or are planning to become pregnant. If you drink alcohol: Limit how much you have to: 0-1 drink a day. Know how much alcohol is in your drink. In the [place] one drink equals one 12 oz bottle of beer (355 mL), one 5 oz glass of wine (148 mL), or one 1 oz glass of hard liquor (44 mL). Lifestyle Do not use any products that contain nicotine or tobacco. These products include cigarettes, chewing tobacco, and vaping devices, such as e-cigarettes. If you need help quitting, ask your health care provider. Do not use street drugs. Do not share needles. Ask your health care provider for help if you need support or information about quitting drugs. General instructions Schedule regular health, dental, and eye exams. Stay current with your vaccines. Tell your health care provider if: You often feel depressed. You have ever been abused or do not feel safe at home. Summary Adopting a healthy lifestyle and getting preventive care are important in promoting  health and wellness. Follow your health care provider's instructions about healthy diet, exercising, and getting tested or screened for diseases. Follow your health care provider's instructions on monitoring your cholesterol and blood pressure. This information is not intended to replace advice given to you by your health care provider. Make sure you discuss any questions you have with your health care provider. Document Revised: 12/17/2020 Document Reviewed: 12/17/2020 Elsevier Patient Education  2024 [employer].

## 2024-05-30 ENCOUNTER — Ambulatory Visit: Admitting: Physical Therapy

## 2024-05-31 ENCOUNTER — Ambulatory Visit (INDEPENDENT_AMBULATORY_CARE_PROVIDER_SITE_OTHER): Admitting: Internal Medicine

## 2024-05-31 VITALS — BP 116/74 | HR 76 | Temp 98.2°F | Ht 64.0 in | Wt 179.0 lb

## 2024-05-31 DIAGNOSIS — Z0000 Encounter for general adult medical examination without abnormal findings: Secondary | ICD-10-CM

## 2024-05-31 DIAGNOSIS — F330 Major depressive disorder, recurrent, mild: Secondary | ICD-10-CM | POA: Diagnosis not present

## 2024-05-31 DIAGNOSIS — E119 Type 2 diabetes mellitus without complications: Secondary | ICD-10-CM | POA: Insufficient documentation

## 2024-05-31 DIAGNOSIS — E66811 Obesity, class 1: Secondary | ICD-10-CM

## 2024-05-31 DIAGNOSIS — Z7984 Long term (current) use of oral hypoglycemic drugs: Secondary | ICD-10-CM

## 2024-05-31 DIAGNOSIS — E66812 Obesity, class 2: Secondary | ICD-10-CM

## 2024-05-31 DIAGNOSIS — M797 Fibromyalgia: Secondary | ICD-10-CM

## 2024-05-31 DIAGNOSIS — Z7985 Long-term (current) use of injectable non-insulin antidiabetic drugs: Secondary | ICD-10-CM

## 2024-05-31 DIAGNOSIS — F419 Anxiety disorder, unspecified: Secondary | ICD-10-CM | POA: Diagnosis not present

## 2024-05-31 DIAGNOSIS — D508 Other iron deficiency anemias: Secondary | ICD-10-CM

## 2024-05-31 DIAGNOSIS — F900 Attention-deficit hyperactivity disorder, predominantly inattentive type: Secondary | ICD-10-CM | POA: Diagnosis not present

## 2024-05-31 DIAGNOSIS — G43009 Migraine without aura, not intractable, without status migrainosus: Secondary | ICD-10-CM

## 2024-05-31 DIAGNOSIS — K219 Gastro-esophageal reflux disease without esophagitis: Secondary | ICD-10-CM

## 2024-05-31 DIAGNOSIS — R000 Tachycardia, unspecified: Secondary | ICD-10-CM

## 2024-05-31 DIAGNOSIS — Z6830 Body mass index (BMI) 30.0-30.9, adult: Secondary | ICD-10-CM

## 2024-05-31 DIAGNOSIS — Z23 Encounter for immunization: Secondary | ICD-10-CM

## 2024-05-31 DIAGNOSIS — E1169 Type 2 diabetes mellitus with other specified complication: Secondary | ICD-10-CM

## 2024-05-31 DIAGNOSIS — E538 Deficiency of other specified B group vitamins: Secondary | ICD-10-CM

## 2024-05-31 DIAGNOSIS — E785 Hyperlipidemia, unspecified: Secondary | ICD-10-CM

## 2024-05-31 MED ORDER — CYCLOBENZAPRINE HCL 5 MG PO TABS: 5.0000 mg | ORAL_TABLET | Freq: Every evening | ORAL | 3 refills | Status: AC | PRN

## 2024-05-31 MED ORDER — DULOXETINE HCL 20 MG PO CPEP: 20.0000 mg | ORAL_CAPSULE | Freq: Every day | ORAL | 1 refills | Status: AC

## 2024-05-31 NOTE — Assessment & Plan Note (Signed)
Chronic Regular exercise and healthy diet encouraged Check lipid panel  Continue Crestor 5 mg 2 days a week

## 2024-05-31 NOTE — Assessment & Plan Note (Signed)
 Chronic Controlled Continue Maxalt 10 mg daily as needed or over-the-counter Excedrin Migraine Also on metoprolol which may help with prevention

## 2024-05-31 NOTE — Assessment & Plan Note (Signed)
 Chronic Does not tolerate oral iron -causes constipation Has not had anemia for a while, but iron  levels tend to be slightly low

## 2024-05-31 NOTE — Assessment & Plan Note (Addendum)
 Chronic Continue B12 injections monthly at home

## 2024-05-31 NOTE — Assessment & Plan Note (Signed)
Chronic Controlled, Stable Continue Adderall 20 mg bid

## 2024-05-31 NOTE — Assessment & Plan Note (Addendum)
 Chronic Associated with hyperlipidemia  Lab Results  Component Value Date   HGBA1C 6.7 (H) 03/29/2024   Management per Dr. Trixie Sugars controlled Continue Ozempic  1 mg weekly, metformin  2000 mg with supper Stressed regular exercise when able-currently doing physical therapy, diabetic diet

## 2024-05-31 NOTE — Assessment & Plan Note (Addendum)
 Chronic Get reverse hiccup in morning and randomly during day Does not use sprays, drinks 2 cans of gingerale and some diet soda GERD controlled Try eliminating carbonation Continue omeprazole  40 mg daily

## 2024-05-31 NOTE — Assessment & Plan Note (Addendum)
 Chronic Increased stress -had knee replacement 2 months ago, husband had perforated bowel and ended up with a colostomy Continue Xanax  0.5 mg 3 times daily as needed Will restart duloxetine  at 20 mg daily-she may need this for couple months to get through all the increase stress and this may also help her fibromyalgia

## 2024-05-31 NOTE — Assessment & Plan Note (Signed)
 Recurrent, mild depression symptoms for 1-2 months Associated with some anxiety, no atypical or psychotic behavior Restart duloxetine  20 mg daily-can titrate dose if necessary  of a medication, referral, diet, monitoring and/or ordering a diagnostic exam.

## 2024-05-31 NOTE — Assessment & Plan Note (Addendum)
 Chronic Has been experiencing more frequent flares recently likely related to poor sleep and increased stress Has only been resting more as treatment of the flares Start Flexeril 5 mg nightly as needed for flares of fibromyalgia-May be able to take for couple of days which may make a significant difference Restart duloxetine  20 mg daily for depression/anxiety and also to help with fibromyalgia

## 2024-05-31 NOTE — Assessment & Plan Note (Addendum)
 Chronic BMI 30.73-improved Losing weight with Ozempic  and lifestyle Encouraged healthy lifestyle-regular exercise once able-currently doing physical therapy, healthy diet-diabetic diet, decrease portions

## 2024-05-31 NOTE — Assessment & Plan Note (Signed)
Chronic Controlled, Stable Continue metoprolol XL 25 mg daily

## 2024-06-02 ENCOUNTER — Encounter: Payer: Self-pay | Admitting: Physical Therapy

## 2024-06-02 ENCOUNTER — Ambulatory Visit: Admitting: Physical Therapy

## 2024-06-02 DIAGNOSIS — R600 Localized edema: Secondary | ICD-10-CM

## 2024-06-02 DIAGNOSIS — M25562 Pain in left knee: Secondary | ICD-10-CM

## 2024-06-02 DIAGNOSIS — Z96652 Presence of left artificial knee joint: Secondary | ICD-10-CM

## 2024-06-02 DIAGNOSIS — M6281 Muscle weakness (generalized): Secondary | ICD-10-CM

## 2024-06-02 DIAGNOSIS — R262 Difficulty in walking, not elsewhere classified: Secondary | ICD-10-CM

## 2024-06-02 DIAGNOSIS — M25662 Stiffness of left knee, not elsewhere classified: Secondary | ICD-10-CM

## 2024-06-02 NOTE — Therapy (Signed)
 OUTPATIENT PHYSICAL THERAPY LOWER EXTREMITY    Patient Name: Lisa Duran MRN: [id number] DOB:[birth year], 43 y.o., female Today's Date: 06/02/2024  END OF SESSION:  PT End of Session - 06/02/24 1600     Visit Number 9    Date for Recertification  07/11/24    PT Start Time 1600    PT Stop Time 1645    PT Time Calculation (min) 45 min    Activity Tolerance Patient tolerated treatment well    Behavior During Therapy [facility] for tasks assessed/performed           Past Medical History:  Diagnosis Date   ADHD (attention deficit hyperactivity disorder)    ALLERGIC RHINITIS    Allergy    Anemia    ANXIETY    Arthritis    left knee   Complication of anesthesia    2017 propofol  for EGD chest tightness, SOB. 2024 surgery had nystagmus after surgery   Depression    DIABETES MELLITUS, TYPE II    Dysrhythmia    Sinus Tachycardia - On Metoprolol    Fibromyalgia 2018   GERD (gastroesophageal reflux disease)    History of hiatal hernia    small hernia found on scan   History of kidney stones    MIGRAINE, COMMON    Obesity, unspecified    OVARIAN CYST    Past Surgical History:  Procedure Laterality Date   HIP ARTHROSCOPY W/ LABRAL REPAIR Right 07/2023   KNEE ARTHROSCOPY Left 08/25/2022   TOTAL KNEE ARTHROPLASTY Left 03/31/2024   Procedure: ARTHROPLASTY, KNEE, TOTAL;  Surgeon: Addie Cordella Hamilton, MD;  Location: [facility] OR;  Service: Orthopedics;  Laterality: Left;   Patient Active Problem List   Diagnosis Date Noted   Diabetes mellitus treated with injections of non-insulin  medication (HCC) 05/31/2024   Diabetes mellitus treated with oral medication (HCC) 05/31/2024   Status post left knee replacement 03/31/2024   Tear of right acetabular labrum 07/21/2023   B12 deficiency 05/09/2021   Hyperlipidemia associated with type 2 diabetes mellitus (HCC) 04/14/2019   Enteritis 01/12/2019   Type 2 diabetes mellitus with hyperlipidemia (HCC) 01/11/2019   Sleep walking disorder  04/06/2017   Iron  deficiency anemia 03/27/2017   Fibromyalgia 02/03/2017   GERD (gastroesophageal reflux disease) 06/24/2015   Eczema 06/22/2015   Depression 06/22/2015   Tachycardia 06/16/2014   Food allergy    ADHD (attention deficit hyperactivity disorder) 01/27/2011   Anxiety 02/20/2010   Allergic rhinitis 11/21/2009   OVARIAN CYST 11/21/2009   Diabetes mellitus type 2 with neurological manifestations (HCC) 11/20/2009   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 11/20/2009   Migraine without aura 11/20/2009    PCP: Glade Hope  REFERRING PROVIDER: Cordella Addie  REFERRING DIAG:  [phone] (ICD-10-CM) - History of total knee replacement, unspecified laterality    THERAPY DIAG:  S/P total knee arthroplasty, left  Stiffness of left knee, not elsewhere classified  Acute pain of left knee  Localized edema  Difficulty in walking, not elsewhere classified  Muscle weakness (generalized)  Rationale for Evaluation and Treatment: Rehabilitation  ONSET DATE: 03/31/24  SUBJECTIVE:   SUBJECTIVE STATEMENT: I am feeling pretty good Some tightness in the L quad   PERTINENT HISTORY: R Hip arthroscopy with labral repair 07/2023 L TKA 03/31/24   PAIN:  Are you having pain? Yes: NPRS scale: 2/10 Pain location: L hip Pain description: achy Aggravating factors: walking, standing  Relieving factors: icing  PRECAUTIONS: Knee  RED FLAGS:  None   WEIGHT BEARING RESTRICTIONS: No  FALLS:  Has patient fallen in last 6 months? No  LIVING ENVIRONMENT: Lives with: lives with their spouse Lives in: House/apartment Stairs: Yes: Internal: 14 steps; on right going up and on left going up Has following equipment at home: Single point cane  OCCUPATION: office manager   PLOF: Independent and Independent with gait  PATIENT GOALS: get back to PLOF and long hikes with 100lb dog   NEXT MD VISIT: 05/31/24  OBJECTIVE:  Note: Objective  measures were completed at Evaluation unless otherwise noted.  DIAGNOSTIC FINDINGS: AP lateral radiographs left knee reviewed. Total knee prosthesis in good  position alignment with no complicating features    COGNITION: Overall cognitive status: Within functional limits for tasks assessed     SENSATION: WFL, numb around the scar   EDEMA:  Swelling around L knee but pt reports it is significantly better   MUSCLE LENGTH: Some tightness in HS and calves   POSTURE: No Significant postural limitations  PALPATION: Some TTP and warm, scar has good healing   LOWER EXTREMITY ROM:  Active ROM Right eval Left eval Left 04/21/24 L  05/02/24 Left  05/09/24 Left 05/24/24 Left 06/02/24  Hip flexion         Hip extension         Hip abduction         Hip adduction         Hip internal rotation         Hip external rotation         Knee flexion  100 106 116 112 116 120  Knee extension  -3 0 0 0 0 0  Ankle dorsiflexion         Ankle plantarflexion         Ankle inversion         Ankle eversion          (Blank rows = not tested)  LOWER EXTREMITY MMT:  MMT Left eval Left 06/02/24  Hip flexion 4+ 4  Hip extension    Hip abduction    Hip adduction    Hip internal rotation    Hip external rotation    Knee flexion 3 4  Knee extension 3 4+  Ankle dorsiflexion    Ankle plantarflexion    Ankle inversion    Ankle eversion     (Blank rows = not tested)  FUNCTIONAL TESTS:  5 times sit to stand: 16s Timed up and go (TUG): 13s w/SPC, 14s w/o  GAIT: Distance walked: in clinic distances Assistive device utilized: Single point cane Level of assistance: Modified independence Comments: antalgic gait, using SPC                                                                                                                                TREATMENT DATE:  06/02/24 NuStep L5 x 4 min Bike L2 x 4 min L knee  PROM with end range holds  Patellar mobs 30lb resisted gait 4 ways x 4  each Forward & lateral step ups 4in box on airex x10 each Sit to stands LE on Airex x10 HS curls 25lb 2x10 Leg Ext 10lb 2x10, LLE 5lb x3   05/24/24 NuStep L 5 x 5 min Bike L2 x 3 min Leg press 20lb 2x10, Heel raises 10lb 2x10  L knee pain during second set of heel raised  L knee PROM with end range holds  Patellar mobs LAQ LLE 3lb 2x10 Sit to stand yellow x10  05/12/24 Nustep L5 x 6 min 4in step ups x10 each 8in step ups x10 each  30lb resisted gait 4 way x 3 each L knee PROM with end range holds  Patellar mobs Sit to stand holding yellow ball 2x10  LAQ LLE 3lb 2x10   PATIENT EDUCATION:  Education details: POC, HEP review Person educated: Patient Education method: Explanation Education comprehension: verbalized understanding  HOME EXERCISE PROGRAM: Quad set, heel slides, SLR with ER and IR, LAQ, stair stretch, standing HS curl, heel raises, STS   ASSESSMENT:  CLINICAL IMPRESSION:  Pt enters doing well today. She has progressed meeting her L knee AROM goal. She has aslo increase her L quad and hamstring strength. Cue for eccentric control needed with resisted gait. Ballotable patella remains due to swelling.  Some instability with step ups using airex.  All interventions completed with good effort.   Patient is a 43 y.o. female who was seen today for physical therapy evaluation and treatment for L TKA on 03/31/24. She is using the CPM 2-3x a day for about an hour. She is not going over 100d on it. Patient presents with weakness in her quads and hamstrings. She has been compliant with home exercises and is doing a great job icing, elevating, and keeping herself motivated. Patient will benefit from skilled PT to address her LE weakness, gait impairments, ROM deficits, etc to be able to return to PLOF and be able to hike and walk with her dog again.   OBJECTIVE IMPAIRMENTS: Abnormal gait, decreased balance, difficulty walking, decreased ROM, decreased strength, increased edema,  and pain.   ACTIVITY LIMITATIONS: bending, sitting, standing, squatting, stairs, transfers, and locomotion level  PARTICIPATION LIMITATIONS: meal prep, cleaning, driving, shopping, community activity, occupation, and yard work  [facility] POTENTIAL: Excellent  CLINICAL DECISION MAKING: Stable/uncomplicated  EVALUATION COMPLEXITY: Low  GOALS: Goals reviewed with patient? Yes   SHORT TERM GOALS: Target date: 05/30/24  Independent with initial HEP. Baseline:  Goal status: 04/21/24 MET  2.  Patient will demonstrate improved functional LE strength by completing 5x STS in <13 seconds w/o compensations Baseline: 16s Goal status: 04/28/24 MET   LONG TERM GOALS: Target date: 07/11/24  Independent with advanced/ongoing HEP to improve outcomes and carryover.  Baseline:  Goal status: INITIAL  2.  Patient will demonstrate L knee ROM 0-120 deg to ascend/descend stairs. Baseline: 3-0-100d with pain Goal status: Met 06/02/24  3.  Patient will be able to ambulate 1000' safely with normal gait pattern to access community.  Baseline: antalgic gait, using SPC Goal status: INITIAL  4.  Patient will demonstrate LLE MMT 5/5  Baseline: 3/5 flexion and ext Goal status: Ongoing 06/02/24  5.  Patient will be able to return to doing long walks and short hikes with her dog Baseline:  Goal status: progressing a little 05/12/24, Ongoing 06/02/24  PLAN:  PT FREQUENCY: 2x/week  PT DURATION: 12 weeks  PLANNED INTERVENTIONS: 97110-Therapeutic exercises,  02469- Therapeutic activity, [id number]- Neuromuscular re-education, [phone]- Self Care, 02859- Manual therapy, [phone]- Gait training, [phone]- Vasopneumatic device, Patient/Family education, Balance training, Stair training, Joint mobilization, Cryotherapy, and Moist heat  PLAN FOR NEXT SESSION: LLE strengthening, ROM for L knee, functional movements   Tanda KANDICE Sorrow, PTA, 06/02/2024, 4:01 PM

## 2024-06-06 ENCOUNTER — Encounter: Payer: Self-pay | Admitting: Physical Therapy

## 2024-06-06 ENCOUNTER — Ambulatory Visit: Admitting: Physical Therapy

## 2024-06-06 DIAGNOSIS — M25562 Pain in left knee: Secondary | ICD-10-CM

## 2024-06-06 DIAGNOSIS — R262 Difficulty in walking, not elsewhere classified: Secondary | ICD-10-CM

## 2024-06-06 DIAGNOSIS — M25662 Stiffness of left knee, not elsewhere classified: Secondary | ICD-10-CM

## 2024-06-06 DIAGNOSIS — R600 Localized edema: Secondary | ICD-10-CM

## 2024-06-06 DIAGNOSIS — M6281 Muscle weakness (generalized): Secondary | ICD-10-CM

## 2024-06-06 DIAGNOSIS — Z96652 Presence of left artificial knee joint: Secondary | ICD-10-CM | POA: Diagnosis not present

## 2024-06-06 NOTE — Therapy (Signed)
 OUTPATIENT PHYSICAL THERAPY LOWER EXTREMITY    Patient Name: Lisa Duran MRN: [id number] DOB:Dec 22, 1980, 43 y.o., female Today's Date: 06/06/2024  END OF SESSION:  PT End of Session - 06/06/24 1559     Visit Number 10    Date for Recertification  07/11/24    PT Start Time 1559    PT Stop Time 1644    PT Time Calculation (min) 45 min    Activity Tolerance Patient tolerated treatment well    Behavior During Therapy [facility] for tasks assessed/performed           Past Medical History:  Diagnosis Date   ADHD (attention deficit hyperactivity disorder)    ALLERGIC RHINITIS    Allergy    Anemia    ANXIETY    Arthritis    left knee   Complication of anesthesia    2017 propofol  for EGD chest tightness, SOB. 2024 surgery had nystagmus after surgery   Depression    DIABETES MELLITUS, TYPE II    Dysrhythmia    Sinus Tachycardia - On Metoprolol    Fibromyalgia 2018   GERD (gastroesophageal reflux disease)    History of hiatal hernia    small hernia found on scan   History of kidney stones    MIGRAINE, COMMON    Obesity, unspecified    OVARIAN CYST    Past Surgical History:  Procedure Laterality Date   HIP ARTHROSCOPY W/ LABRAL REPAIR Right 07/2023   KNEE ARTHROSCOPY Left 08/25/2022   TOTAL KNEE ARTHROPLASTY Left 03/31/2024   Procedure: ARTHROPLASTY, KNEE, TOTAL;  Surgeon: Addie Cordella Hamilton, MD;  Location: [facility] OR;  Service: Orthopedics;  Laterality: Left;   Patient Active Problem List   Diagnosis Date Noted   Diabetes mellitus treated with injections of non-insulin  medication (HCC) 05/31/2024   Diabetes mellitus treated with oral medication (HCC) 05/31/2024   Status post left knee replacement 03/31/2024   Tear of right acetabular labrum 07/21/2023   B12 deficiency 05/09/2021   Hyperlipidemia associated with type 2 diabetes mellitus (HCC) 04/14/2019   Enteritis 01/12/2019   Type 2 diabetes mellitus with hyperlipidemia (HCC) 01/11/2019   Sleep walking  disorder 04/06/2017   Iron  deficiency anemia 03/27/2017   Fibromyalgia 02/03/2017   GERD (gastroesophageal reflux disease) 06/24/2015   Eczema 06/22/2015   Depression 06/22/2015   Tachycardia 06/16/2014   Food allergy    ADHD (attention deficit hyperactivity disorder) 01/27/2011   Anxiety 02/20/2010   Allergic rhinitis 11/21/2009   OVARIAN CYST 11/21/2009   Diabetes mellitus type 2 with neurological manifestations (HCC) 11/20/2009   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 11/20/2009   Migraine without aura 11/20/2009    PCP: Glade Hope  REFERRING PROVIDER: Cordella Addie  REFERRING DIAG:  [phone] (ICD-10-CM) - History of total knee replacement, unspecified laterality    THERAPY DIAG:  Stiffness of left knee, not elsewhere classified  S/P total knee arthroplasty, left  Acute pain of left knee  Localized edema  Difficulty in walking, not elsewhere classified  Muscle weakness (generalized)  Rationale for Evaluation and Treatment: Rehabilitation  ONSET DATE: 03/31/24  SUBJECTIVE:   SUBJECTIVE STATEMENT: Feeling pretty good   PERTINENT HISTORY: R Hip arthroscopy with labral repair 07/2023 L TKA 03/31/24   PAIN:  Are you having pain? Yes: NPRS scale: 3/10 Pain location: L hip Pain description: sore Aggravating factors: walking, standing  Relieving factors: icing  PRECAUTIONS: Knee  RED FLAGS: None   WEIGHT BEARING RESTRICTIONS: No  FALLS:  Has patient fallen in last 6 months? No  LIVING ENVIRONMENT: Lives with: lives with their spouse Lives in: House/apartment Stairs: Yes: Internal: 14 steps; on right going up and on left going up Has following equipment at home: Single point cane  OCCUPATION: office manager   PLOF: Independent and Independent with gait  PATIENT GOALS: get back to PLOF and long hikes with 100lb dog   NEXT MD VISIT: 05/31/24  OBJECTIVE:  Note: Objective measures were completed at  Evaluation unless otherwise noted.  DIAGNOSTIC FINDINGS: AP lateral radiographs left knee reviewed. Total knee prosthesis in good  position alignment with no complicating features    COGNITION: Overall cognitive status: Within functional limits for tasks assessed     SENSATION: WFL, numb around the scar   EDEMA:  Swelling around L knee but pt reports it is significantly better   MUSCLE LENGTH: Some tightness in HS and calves   POSTURE: No Significant postural limitations  PALPATION: Some TTP and warm, scar has good healing   LOWER EXTREMITY ROM:  Active ROM Right eval Left eval Left 04/21/24 L  05/02/24 Left  05/09/24 Left 05/24/24 Left 06/02/24  Hip flexion         Hip extension         Hip abduction         Hip adduction         Hip internal rotation         Hip external rotation         Knee flexion  100 106 116 112 116 120  Knee extension  -3 0 0 0 0 0  Ankle dorsiflexion         Ankle plantarflexion         Ankle inversion         Ankle eversion          (Blank rows = not tested)  LOWER EXTREMITY MMT:  MMT Left eval Left 06/02/24  Hip flexion 4+ 4  Hip extension    Hip abduction    Hip adduction    Hip internal rotation    Hip external rotation    Knee flexion 3 4  Knee extension 3 4+  Ankle dorsiflexion    Ankle plantarflexion    Ankle inversion    Ankle eversion     (Blank rows = not tested)  FUNCTIONAL TESTS:  5 times sit to stand: 16s Timed up and go (TUG): 13s w/SPC, 14s w/o  GAIT: Distance walked: in clinic distances Assistive device utilized: Single point cane Level of assistance: Modified independence Comments: antalgic gait, using SPC                                                                                                                                TREATMENT DATE 06/06/24 NuStep L3 x 4 min Bike L3  x 4 min  Leg press 40lb 3x10 4in lateral step ups x10  each HS curls 35lb x10, 25lb x15, LLE 15lb x10 L knee PROM with  end range holds  Patellar mobs Sit to stands LE on Airex 2x10  06/02/24 NuStep L5 x 4 min Bike L2 x 4 min L knee PROM with end range holds  Patellar mobs 30lb resisted gait 4 ways x 4 each Forward & lateral step ups 4in box on airex x10 each Sit to stands LE on Airex x10 HS curls 25lb 2x10 Leg Ext 10lb 2x10, LLE 5lb x3   05/24/24 NuStep L 5 x 5 min Bike L2 x 3 min Leg press 20lb 2x10, Heel raises 10lb 2x10  L knee pain during second set of heel raised  L knee PROM with end range holds  Patellar mobs LAQ LLE 3lb 2x10 Sit to stand yellow x10  05/12/24 Nustep L5 x 6 min 4in step ups x10 each 8in step ups x10 each  30lb resisted gait 4 way x 3 each L knee PROM with end range holds  Patellar mobs Sit to stand holding yellow ball 2x10  LAQ LLE 3lb 2x10   PATIENT EDUCATION:  Education details: POC, HEP review Person educated: Patient Education method: Explanation Education comprehension: verbalized understanding  HOME EXERCISE PROGRAM: Quad set, heel slides, SLR with ER and IR, LAQ, stair stretch, standing HS curl, heel raises, STS   ASSESSMENT:  CLINICAL IMPRESSION:  Pt enters doing well. Continues to progress with functional strength. Increase reps and or resistance tolerated during session.  Ballotable patella remains due to swelling.  Some end range pain with passive flexion.  All interventions completed with good effort.   Patient is a 43 y.o. female who was seen today for physical therapy evaluation and treatment for L TKA on 03/31/24. She is using the CPM 2-3x a day for about an hour. She is not going over 100d on it. Patient presents with weakness in her quads and hamstrings. She has been compliant with home exercises and is doing a great job icing, elevating, and keeping herself motivated. Patient will benefit from skilled PT to address her LE weakness, gait impairments, ROM deficits, etc to be able to return to PLOF and be able to hike and walk with her dog again.    OBJECTIVE IMPAIRMENTS: Abnormal gait, decreased balance, difficulty walking, decreased ROM, decreased strength, increased edema, and pain.   ACTIVITY LIMITATIONS: bending, sitting, standing, squatting, stairs, transfers, and locomotion level  PARTICIPATION LIMITATIONS: meal prep, cleaning, driving, shopping, community activity, occupation, and yard work  [facility] POTENTIAL: Excellent  CLINICAL DECISION MAKING: Stable/uncomplicated  EVALUATION COMPLEXITY: Low  GOALS: Goals reviewed with patient? Yes   SHORT TERM GOALS: Target date: 05/30/24  Independent with initial HEP. Baseline:  Goal status: 04/21/24 MET  2.  Patient will demonstrate improved functional LE strength by completing 5x STS in <13 seconds w/o compensations Baseline: 16s Goal status: 04/28/24 MET   LONG TERM GOALS: Target date: 07/11/24  Independent with advanced/ongoing HEP to improve outcomes and carryover.  Baseline:  Goal status: INITIAL  2.  Patient will demonstrate L knee ROM 0-120 deg to ascend/descend stairs. Baseline: 3-0-100d with pain Goal status: Met 06/02/24  3.  Patient will be able to ambulate 1000' safely with normal gait pattern to access community.  Baseline: antalgic gait, using SPC Goal status: INITIAL  4.  Patient will demonstrate LLE MMT 5/5  Baseline: 3/5 flexion and ext Goal status: Ongoing 06/02/24  5.  Patient will be able to return to doing long walks and short hikes with  her dog Baseline:  Goal status: progressing a little 05/12/24, Ongoing 06/02/24  PLAN:  PT FREQUENCY: 2x/week  PT DURATION: 12 weeks  PLANNED INTERVENTIONS: 97110-Therapeutic exercises, 97530- Therapeutic activity, 97112- Neuromuscular re-education, 97535- Self Care, 02859- Manual therapy, [phone]- Gait training, [phone]- Vasopneumatic device, Patient/Family education, Balance training, Stair training, Joint mobilization, Cryotherapy, and Moist heat  PLAN FOR NEXT SESSION: LLE strengthening, ROM for L knee,  functional movements   Tanda KANDICE Sorrow, PTA, 06/06/2024, 3:59 PM

## 2024-06-09 ENCOUNTER — Encounter: Payer: Self-pay | Admitting: Physical Therapy

## 2024-06-09 ENCOUNTER — Ambulatory Visit: Admitting: Physical Therapy

## 2024-06-09 DIAGNOSIS — R600 Localized edema: Secondary | ICD-10-CM

## 2024-06-09 DIAGNOSIS — M6281 Muscle weakness (generalized): Secondary | ICD-10-CM

## 2024-06-09 DIAGNOSIS — M25662 Stiffness of left knee, not elsewhere classified: Secondary | ICD-10-CM

## 2024-06-09 DIAGNOSIS — Z96652 Presence of left artificial knee joint: Secondary | ICD-10-CM | POA: Diagnosis not present

## 2024-06-09 DIAGNOSIS — M25562 Pain in left knee: Secondary | ICD-10-CM

## 2024-06-09 DIAGNOSIS — R262 Difficulty in walking, not elsewhere classified: Secondary | ICD-10-CM

## 2024-06-09 NOTE — Therapy (Signed)
 OUTPATIENT PHYSICAL THERAPY LOWER EXTREMITY    Patient Name: Lisa Duran MRN: [id number] DOB:Mar 22, 1981, 43 y.o., female Today's Date: 06/09/2024  END OF SESSION:  PT End of Session - 06/09/24 1601     Visit Number 11    Date for Recertification  07/11/24    PT Start Time 1600    PT Stop Time 1645    PT Time Calculation (min) 45 min    Activity Tolerance Patient tolerated treatment well    Behavior During Therapy [facility] for tasks assessed/performed           Past Medical History:  Diagnosis Date   ADHD (attention deficit hyperactivity disorder)    ALLERGIC RHINITIS    Allergy    Anemia    ANXIETY    Arthritis    left knee   Complication of anesthesia    2017 propofol  for EGD chest tightness, SOB. 2024 surgery had nystagmus after surgery   Depression    DIABETES MELLITUS, TYPE II    Dysrhythmia    Sinus Tachycardia - On Metoprolol    Fibromyalgia 2018   GERD (gastroesophageal reflux disease)    History of hiatal hernia    small hernia found on scan   History of kidney stones    MIGRAINE, COMMON    Obesity, unspecified    OVARIAN CYST    Past Surgical History:  Procedure Laterality Date   HIP ARTHROSCOPY W/ LABRAL REPAIR Right 07/2023   KNEE ARTHROSCOPY Left 08/25/2022   TOTAL KNEE ARTHROPLASTY Left 03/31/2024   Procedure: ARTHROPLASTY, KNEE, TOTAL;  Surgeon: Addie Cordella Hamilton, MD;  Location: [facility] OR;  Service: Orthopedics;  Laterality: Left;   Patient Active Problem List   Diagnosis Date Noted   Diabetes mellitus treated with injections of non-insulin  medication (HCC) 05/31/2024   Diabetes mellitus treated with oral medication (HCC) 05/31/2024   Status post left knee replacement 03/31/2024   Tear of right acetabular labrum 07/21/2023   B12 deficiency 05/09/2021   Hyperlipidemia associated with type 2 diabetes mellitus (HCC) 04/14/2019   Enteritis 01/12/2019   Type 2 diabetes mellitus with hyperlipidemia (HCC) 01/11/2019   Sleep walking  disorder 04/06/2017   Iron  deficiency anemia 03/27/2017   Fibromyalgia 02/03/2017   GERD (gastroesophageal reflux disease) 06/24/2015   Eczema 06/22/2015   Depression 06/22/2015   Tachycardia 06/16/2014   Food allergy    ADHD (attention deficit hyperactivity disorder) 01/27/2011   Anxiety 02/20/2010   Allergic rhinitis 11/21/2009   OVARIAN CYST 11/21/2009   Diabetes mellitus type 2 with neurological manifestations (HCC) 11/20/2009   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 11/20/2009   Migraine without aura 11/20/2009    PCP: Glade Hope  REFERRING PROVIDER: Cordella Addie  REFERRING DIAG:  [phone] (ICD-10-CM) - History of total knee replacement, unspecified laterality    THERAPY DIAG:  Stiffness of left knee, not elsewhere classified  S/P total knee arthroplasty, left  Acute pain of left knee  Localized edema  Difficulty in walking, not elsewhere classified  Muscle weakness (generalized)  Rationale for Evaluation and Treatment: Rehabilitation  ONSET DATE: 03/31/24  SUBJECTIVE:   SUBJECTIVE STATEMENT: Feeling very good physically, some sharp medial L knee pain, feeling stronger.    PERTINENT HISTORY: R Hip arthroscopy with labral repair 07/2023 L TKA 03/31/24   PAIN:  Are you having pain? Yes: NPRS scale: 1/10 Pain location: L knee Pain description: sore Aggravating factors: walking, standing  Relieving factors: icing  PRECAUTIONS: Knee  RED FLAGS: None   WEIGHT BEARING RESTRICTIONS: No  FALLS:  Has patient fallen in last 6 months? No  LIVING ENVIRONMENT: Lives with: lives with their spouse Lives in: House/apartment Stairs: Yes: Internal: 14 steps; on right going up and on left going up Has following equipment at home: Single point cane  OCCUPATION: office manager   PLOF: Independent and Independent with gait  PATIENT GOALS: get back to PLOF and long hikes with 100lb dog   NEXT MD VISIT:  05/31/24  OBJECTIVE:  Note: Objective measures were completed at Evaluation unless otherwise noted.  DIAGNOSTIC FINDINGS: AP lateral radiographs left knee reviewed. Total knee prosthesis in good  position alignment with no complicating features    COGNITION: Overall cognitive status: Within functional limits for tasks assessed     SENSATION: WFL, numb around the scar   EDEMA:  Swelling around L knee but pt reports it is significantly better   MUSCLE LENGTH: Some tightness in HS and calves   POSTURE: No Significant postural limitations  PALPATION: Some TTP and warm, scar has good healing   LOWER EXTREMITY ROM:  Active ROM Right eval Left eval Left 04/21/24 L  05/02/24 Left  05/09/24 Left 05/24/24 Left 06/02/24 Left  06/09/24  Hip flexion          Hip extension          Hip abduction          Hip adduction          Hip internal rotation          Hip external rotation          Knee flexion  100 106 116 112 116 120 120  Knee extension  -3 0 0 0 0 0 0  Ankle dorsiflexion          Ankle plantarflexion          Ankle inversion          Ankle eversion           (Blank rows = not tested)  LOWER EXTREMITY MMT:  MMT Left eval Left 06/02/24  Hip flexion 4+ 4  Hip extension    Hip abduction    Hip adduction    Hip internal rotation    Hip external rotation    Knee flexion 3 4  Knee extension 3 4+  Ankle dorsiflexion    Ankle plantarflexion    Ankle inversion    Ankle eversion     (Blank rows = not tested)  FUNCTIONAL TESTS:  5 times sit to stand: 16s Timed up and go (TUG): 13s w/SPC, 14s w/o  GAIT: Distance walked: in clinic distances Assistive device utilized: Single point cane Level of assistance: Modified independence Comments: antalgic gait, using SPC  TREATMENT DATE 06/09/24 NuStep L 5 x 6 min Gait two laps around the back  building, some L knee eccentric weakness Stairs no rails at pattern, 4 and 6 inch L knee PROM with end range holds  Patellar mobs HS curls 25lb 2x10 Leg Ext 5lb 2x10 6in lateral step ups  8in step ups x5 each  06/06/24 NuStep L3 x 4 min Bike L3  x 4 min  Leg press 40lb 3x10 4in lateral step ups x10 each HS curls 35lb x10, 25lb x15, LLE 15lb x10 L knee PROM with end range holds  Patellar mobs Sit to stands LE on Airex 2x10  06/02/24 NuStep L5 x 4 min Bike L2 x 4 min L knee PROM with end range holds  Patellar mobs 30lb resisted gait 4 ways x 4 each Forward & lateral step ups 4in box on airex x10 each Sit to stands LE on Airex x10 HS curls 25lb 2x10 Leg Ext 10lb 2x10, LLE 5lb x3   05/24/24 NuStep L 5 x 5 min Bike L2 x 3 min Leg press 20lb 2x10, Heel raises 10lb 2x10  L knee pain during second set of heel raised  L knee PROM with end range holds  Patellar mobs LAQ LLE 3lb 2x10 Sit to stand yellow x10  05/12/24 Nustep L5 x 6 min 4in step ups x10 each 8in step ups x10 each  30lb resisted gait 4 way x 3 each L knee PROM with end range holds  Patellar mobs Sit to stand holding yellow ball 2x10  LAQ LLE 3lb 2x10   PATIENT EDUCATION:  Education details: POC, HEP review Person educated: Patient Education method: Explanation Education comprehension: verbalized understanding  HOME EXERCISE PROGRAM: Quad set, heel slides, SLR with ER and IR, LAQ, stair stretch, standing HS curl, heel raises, STS   ASSESSMENT:  CLINICAL IMPRESSION:  Pt enters doing well feeling like she has turned a corner. Progressed to outdoor ambulation up and down slope, pt doe have a slight limp at times. Continues to progress with functional strength. Some weakness noted with 8 in step ups.  Ballotable patella remains due to swelling.  All interventions completed with good effort.   Patient is a 43 y.o. female who was seen today for physical therapy evaluation and treatment for L TKA on  03/31/24. She is using the CPM 2-3x a day for about an hour. She is not going over 100d on it. Patient presents with weakness in her quads and hamstrings. She has been compliant with home exercises and is doing a great job icing, elevating, and keeping herself motivated. Patient will benefit from skilled PT to address her LE weakness, gait impairments, ROM deficits, etc to be able to return to PLOF and be able to hike and walk with her dog again.   OBJECTIVE IMPAIRMENTS: Abnormal gait, decreased balance, difficulty walking, decreased ROM, decreased strength, increased edema, and pain.   ACTIVITY LIMITATIONS: bending, sitting, standing, squatting, stairs, transfers, and locomotion level  PARTICIPATION LIMITATIONS: meal prep, cleaning, driving, shopping, community activity, occupation, and yard work  [facility] POTENTIAL: Excellent  CLINICAL DECISION MAKING: Stable/uncomplicated  EVALUATION COMPLEXITY: Low  GOALS: Goals reviewed with patient? Yes   SHORT TERM GOALS: Target date: 05/30/24  Independent with initial HEP. Baseline:  Goal status: 04/21/24 MET  2.  Patient will demonstrate improved functional LE strength by completing 5x STS in <13 seconds w/o compensations Baseline: 16s Goal status: 04/28/24 MET   LONG TERM GOALS: Target date: 07/11/24  Independent with advanced/ongoing HEP  to improve outcomes and carryover.  Baseline:  Goal status: Walks the dog daily  2.  Patient will demonstrate L knee ROM 0-120 deg to ascend/descend stairs. Baseline: 3-0-100d with pain Goal status: Met 06/02/24  3.  Patient will be able to ambulate 1000' safely with normal gait pattern to access community.  Baseline: antalgic gait, using SPC Goal status: Met 06/09/24  4.  Patient will demonstrate LLE MMT 5/5  Baseline: 3/5 flexion and ext Goal status: Ongoing 06/02/24  5.  Patient will be able to return to doing long walks and short hikes with her dog Baseline:  Goal status: progressing a little  05/12/24, Ongoing 06/02/24, Partly Met 06/09/24  PLAN:  PT FREQUENCY: 2x/week  PT DURATION: 12 weeks  PLANNED INTERVENTIONS: 97110-Therapeutic exercises, 97530- Therapeutic activity, 97112- Neuromuscular re-education, 97535- Self Care, 02859- Manual therapy, [id number]- Gait training, [id number]- Vasopneumatic device, Patient/Family education, Balance training, Stair training, Joint mobilization, Cryotherapy, and Moist heat  PLAN FOR NEXT SESSION: LLE strengthening, ROM for L knee, functional movements   Tanda KANDICE Sorrow, PTA, 06/09/2024, 4:01 PM

## 2024-06-13 ENCOUNTER — Encounter: Payer: Self-pay | Admitting: Radiology

## 2024-06-20 ENCOUNTER — Ambulatory Visit (INDEPENDENT_AMBULATORY_CARE_PROVIDER_SITE_OTHER): Admitting: Orthopedic Surgery

## 2024-06-20 ENCOUNTER — Encounter: Payer: Self-pay | Admitting: Orthopedic Surgery

## 2024-06-20 DIAGNOSIS — Z96659 Presence of unspecified artificial knee joint: Secondary | ICD-10-CM

## 2024-06-20 MED ORDER — AMOXICILLIN 500 MG PO TABS
ORAL_TABLET | ORAL | 0 refills | Status: AC
Start: 2024-06-20 — End: ?

## 2024-06-20 NOTE — Progress Notes (Unsigned)
 Post-Op Visit Note   Patient: Lisa Duran           Date of Birth: July 30, 1981           MRN: [id number] Visit Date: 06/20/2024 PCP: Geofm Glade PARAS, MD   Assessment & Plan:  Chief Complaint:  Chief Complaint  Patient presents with   Left Knee - Routine Post Op    03/31/24 left TKA   Visit Diagnoses:  1. History of total knee replacement, unspecified laterality     Plan: Kasiyah is now about 3 months out left total knee replacement.  She is doing well.  Has had a lot of improvement over the past 2 weeks.  On examination she has range of motion of 0-1 20.  Patella tracks well.  No effusion.  Good stability to varus and valgus stress at 0 and 30 degrees.  Plan at this time is continue with home exercise program for range of motion.  Would like for her to really focus on quad strengthening which would make the knee feel more normal.  Takes occasional Tylenol  and ibuprofen .  She has done well with her knee replacement through a very good rehabilitation effort on her part and the therapist.  We will see her back as needed.  Follow-Up Instructions: No follow-ups on file.   Orders:  No orders of the defined types were placed in this encounter.  Meds ordered this encounter  Medications   amoxicillin  (AMOXIL ) 500 MG tablet    Sig: Take 2g (#4 500mg  tablets) one hour prior to dental procedure.    Dispense:  12 tablet    Refill:  0    Imaging: No results found.  PMFS History: Patient Active Problem List   Diagnosis Date Noted   Diabetes mellitus treated with injections of non-insulin  medication (HCC) 05/31/2024   Diabetes mellitus treated with oral medication (HCC) 05/31/2024   Status post left knee replacement 03/31/2024   Tear of right acetabular labrum 07/21/2023   B12 deficiency 05/09/2021   Hyperlipidemia associated with type 2 diabetes mellitus (HCC) 04/14/2019   Enteritis 01/12/2019   Type 2 diabetes mellitus with hyperlipidemia (HCC) 01/11/2019   Sleep  walking disorder 04/06/2017   Iron  deficiency anemia 03/27/2017   Fibromyalgia 02/03/2017   GERD (gastroesophageal reflux disease) 06/24/2015   Eczema 06/22/2015   Depression 06/22/2015   Tachycardia 06/16/2014   Food allergy    ADHD (attention deficit hyperactivity disorder) 01/27/2011   Anxiety 02/20/2010   Allergic rhinitis 11/21/2009   OVARIAN CYST 11/21/2009   Diabetes mellitus type 2 with neurological manifestations (HCC) 11/20/2009   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 11/20/2009   Migraine without aura 11/20/2009   Past Medical History:  Diagnosis Date   ADHD (attention deficit hyperactivity disorder)    ALLERGIC RHINITIS    Allergy    Anemia    ANXIETY    Arthritis    left knee   Complication of anesthesia    2017 propofol  for EGD chest tightness, SOB. 2024 surgery had nystagmus after surgery   Depression    DIABETES MELLITUS, TYPE II    Dysrhythmia    Sinus Tachycardia - On Metoprolol    Fibromyalgia 2018   GERD (gastroesophageal reflux disease)    History of hiatal hernia    small hernia found on scan   History of kidney stones    MIGRAINE, COMMON    Obesity, unspecified    OVARIAN CYST  Family History  Problem Relation Age of Onset   Diabetes Mother    Hyperlipidemia Mother    CAD Mother    CVA Mother    Diabetes Father    Hyperlipidemia Father    Coronary artery disease Father    Hypertension Father    Cancer Father        esoph   Stroke Father    Cancer Maternal Grandfather 25       Prostate and Lung   Diabetes Sister        Gestational   Graves' disease Maternal Aunt    Cancer Maternal Uncle        Brain   Stroke Paternal Grandmother    Heart disease Paternal Grandmother    Stroke Paternal Grandfather    Heart disease Paternal Grandfather    Diabetes Sister        Gestational and Type II   Hashimoto's thyroiditis Cousin    Cancer Maternal Uncle        Brain    Past Surgical History:   Procedure Laterality Date   HIP ARTHROSCOPY W/ LABRAL REPAIR Right 07/2023   KNEE ARTHROSCOPY Left 08/25/2022   TOTAL KNEE ARTHROPLASTY Left 03/31/2024   Procedure: ARTHROPLASTY, KNEE, TOTAL;  Surgeon: Addie Cordella Hamilton, MD;  Location: [facility] OR;  Service: Orthopedics;  Laterality: Left;   Social History   Occupational History   Not on file  Tobacco Use   Smoking status: Former    Current packs/day: 0.00    Types: Cigarettes    Quit date: 08/20/2002    Years since quitting: 21.8   Smokeless tobacco: Never   Tobacco comments:    single, separated from spouse 02/2010.   Vaping Use   Vaping status: Never Used  Substance and Sexual Activity   Alcohol use: Yes    Comment: maybe one drink per month   Drug use: No   Sexual activity: Not Currently    Birth control/protection: I.U.D.

## 2024-06-21 ENCOUNTER — Other Ambulatory Visit: Payer: Self-pay | Admitting: Surgical

## 2024-06-21 MED ORDER — FLUCONAZOLE 100 MG PO TABS: 100.0000 mg | ORAL_TABLET | Freq: Every day | ORAL | 0 refills | Status: AC

## 2024-06-30 ENCOUNTER — Ambulatory Visit: Admitting: Physical Therapy

## 2024-07-01 NOTE — Progress Notes (Unsigned)
 Lisa Duran D.CLEMENTEEN [name] [address] [place] [address] Phone: [phone]   Assessment and Plan:     1. Right hip pain (Primary) 2. Chronic right-sided low back pain with right-sided sciatica -Chronic with exacerbation, subsequent visit - Continued pain in right hip, low back, with numbness and tingling in thoracolumbar junction and right foot.  Currently unclear etiology of symptoms.  Physical exam and HPI could be consistent with intra-articular hip pathology such as small labral tear as was seen on 05/14/2023 right hip MRI and corrected with right hip arthroscopy on 07/21/2023, or could be consistent with lumbar radiculopathy, however patient had no significant findings with only mild disc bulging at L2-L3 based on lumbar MRI 02/25/2024.  Patient could also be experiencing musculoskeletal dysfunction resulting from recovery from total left knee replacement 03/31/2024 - Recommend starting conservative therapy with physical therapy and HEP targeting hip and low back -Continue additional physical therapy for left knee status post left knee replacement - Start meloxicam  15 mg daily x2 weeks.  If still having pain after 2 weeks, complete 3rd-week of NSAID. May use remaining NSAID as needed once daily for pain control.  Do not to use additional over-the-counter NSAIDs (ibuprofen , naproxen, Advil , Aleve, etc.) while taking prescription NSAIDs.  May use Tylenol  [phone] mg 2 to 3 times a day for breakthrough pain.  15 additional minutes spent for educating Therapeutic Home Exercise Program.  This included exercises focusing on stretching, strengthening, with focus on eccentric aspects.   Long term goals include an improvement in range of motion, strength, endurance as well as avoiding reinjury. Patient's frequency would include in 1-2 times a day, 3-5 times a week for a duration of 6-12 weeks. Proper technique shown and discussed handout in great detail with  ATC.  All questions were discussed and answered.    Pertinent previous records reviewed include orthopedic surgery procedure note 03/31/2024, prior lumbar MRI and right hip MRI   Follow Up: 6 weeks for reevaluation.  If no improvement or worsening of symptoms, could consider OMT discussion versus advanced imaging with repeat right hip MRI versus lumbar MRI.  Could consider injections such as intra-articular right hip injection versus epidural CSI   Subjective:   I, [name], am serving as a neurosurgeon for Doctor [employer]  Chief Complaint: Left knee pain    HPI:    05/21/23 Patient states left knee pain and continued right hip pain    09/14/23 Today patient states Left knee is in a lot of pain that started 2 weeks ago. Been going in PT for her hip labrel tear repair surgery and the PT thought it was a good idea to be seen. Patient states it feels like it did when she tore her meniscus. The right knee is starting to hurt as well. Pain is sharp and shooting. Has to use crutches sometimes. Also doing RICE   11/06/2023 Patient states ready for gel injection. Would like to discuss right foot was told to ask from endoc   11/17/2023 Patient states knee pain got worse after gel. Knee feels loose, swelling, and decreased ROM    12/10/2023 Patient states that the knee is not happy today. The meloxicam  has helped some but the sharp pain is still there and waking her up at night.    02/11/2024 Patient states knees are not happy today. The brace is helping but it has progressed. Pain in hip is radiating. It is ping-ponging around. It feels like when  she had the labral tear.    07/05/2024 Patient states right hip pain that radiates up the back and down the leg. Has been doing increased activity. Thoracic back pain with numbness when she does activity. Hip pain when driving. Low back is tight and painful. Right foot tingling is every night now.   Relevant Historical Information: DM type ll     Additional pertinent review of systems negative.   Current Outpatient Medications:    meloxicam  (MOBIC ) 15 MG tablet, Take 1 tablet daily for 2 weeks.  If still in pain after 2 weeks, take 1 tablet daily for an additional 1 week., Disp: 30 tablet, Rfl: 0   ALPRAZolam  (XANAX ) 0.5 MG tablet, TAKE 1 TABLET(0.5 MG) BY MOUTH THREE TIMES DAILY AS NEEDED FOR ANXIETY, Disp: 60 tablet, Rfl: 5   amoxicillin  (AMOXIL ) 500 MG tablet, Take 2g (#4 500mg  tablets) one hour prior to dental procedure., Disp: 12 tablet, Rfl: 0   amphetamine -dextroamphetamine  (ADDERALL ) 20 MG tablet, Take 1 tablet (20 mg total) by mouth 2 (two) times daily., Disp: 180 tablet, Rfl: 0   cyanocobalamin  (VITAMIN B12) 1000 MCG/ML injection, INJECT INTRAMUSCULARLY 1 ML  EVERY 30 DAYS (DISCARD 28 DAYS  AFTER FIRST USE), Disp: 3 mL, Rfl: 2   cyclobenzaprine  (FLEXERIL ) 5 MG tablet, Take 1 tablet (5 mg total) by mouth at bedtime as needed for muscle spasms., Disp: 30 tablet, Rfl: 3   DULoxetine  (CYMBALTA ) 20 MG capsule, Take 1 capsule (20 mg total) by mouth daily., Disp: 90 capsule, Rfl: 1   EPINEPHrine  0.3 mg/0.3 mL IJ SOAJ injection, Use as needed for hypersensitivity reaction, Disp: 1 each, Rfl: 0   fexofenadine (ALLEGRA) 60 MG tablet, Take 60 mg by mouth daily as needed (Allergies)., Disp: , Rfl:    fluconazole  (DIFLUCAN ) 100 MG tablet, Take 1 tablet (100 mg total) by mouth daily. Take one tablet with antibiotics prior to dental procedure, Disp: 3 tablet, Rfl: 0   glucose blood (ONETOUCH VERIO) test strip, Use 2x a day, Disp: 200 each, Rfl: 3   levonorgestrel  (MIRENA ) 20 MCG/24HR IUD, 1 Intra Uterine Device (1 each total) by Intrauterine route once., Disp: 1 each, Rfl: 0   metFORMIN  (GLUCOPHAGE ) 1000 MG tablet, TAKE 2 TABLETS BY MOUTH DAILY WITH SUPPER., Disp: 180 tablet, Rfl: 3   metoprolol  succinate (TOPROL -XL) 25 MG 24 hr tablet, TAKE 1 TABLET BY MOUTH DAILY, Disp: 90 tablet, Rfl: 3   omeprazole  (PRILOSEC) 40 MG capsule, TAKE 1  CAPSULE BY MOUTH DAILY BEFORE SUPPER, Disp: 90 capsule, Rfl: 2   ondansetron  (ZOFRAN -ODT) 4 MG disintegrating tablet, Take 1 tablet (4 mg total) by mouth every 8 (eight) hours as needed for nausea or vomiting., Disp: 20 tablet, Rfl: 0   promethazine  (PHENERGAN ) 25 MG tablet, Take 1 tablet (25 mg total) by mouth every 6 (six) hours as needed for nausea., Disp: 15 tablet, Rfl: 0   rizatriptan  (MAXALT ) 10 MG tablet, Take 1 tablet (10 mg total) by mouth as needed for migraine. May repeat in 2 hours if needed, Disp: 10 tablet, Rfl: 8   rosuvastatin  (CRESTOR ) 5 MG tablet, TAKE 1 TABLET(5 MG) BY MOUTH 2 TIMES A WEEK, Disp: 13 tablet, Rfl: 3   Semaglutide , 1 MG/DOSE, (OZEMPIC , 1 MG/DOSE,) 4 MG/3ML SOPN, Inject 1 mg into the skin once a week., Disp: 9 mL, Rfl: 3   SYRINGE-NEEDLE, DISP, 3 ML 25G X 1 3 ML MISC, Use for monthly B12 injections, Disp: 12 each, Rfl: 1   triamcinolone  cream (KENALOG ) 0.1 %,  Apply 1 application topically 2 (two) times daily as needed (for itching)., Disp: 28.4 g, Rfl: 1   Objective:     Vitals:   07/05/24 1001  BP: 118/76  Weight: 177 lb (80.3 kg)  Height: 5' 4 (1.626 m)      Body mass index is 30.38 kg/m.    Physical Exam:    Gen: Appears well, nad, nontoxic and pleasant Psych: Alert and oriented, appropriate mood and affect Neuro: Decreased sensation over right lateral thigh compared to left.  Otherwise sensation intact, strength is 5/5 in upper and lower extremities, muscle tone wnl Skin: no susupicious lesions or rashes  Back - Normal skin, Spine with normal alignment and no deformity.   No tenderness to vertebral process palpation.   Bilateral thoracic and lumbar paraspinous muscles are mildly tender and without spasm NTTP gluteal musculature Straight leg raise negative Trendelenberg negative  Gait normal   Right hip: No deformity, swelling or wasting ROM Flexion 90, ext 30, IR 35, ER 45 NTTP over the hip flexors, greater trochanter, gluteal musculature,  si joint, lumbar spine Negative log roll with FROM Negative FABER Positive FADIR   Electronically signed by:  Odis Mace D.[name] 10:25 AM 07/05/24

## 2024-07-05 ENCOUNTER — Ambulatory Visit: Admitting: Sports Medicine

## 2024-07-05 VITALS — BP 118/76 | Ht 64.0 in | Wt 177.0 lb

## 2024-07-05 DIAGNOSIS — M5441 Lumbago with sciatica, right side: Secondary | ICD-10-CM

## 2024-07-05 DIAGNOSIS — M25551 Pain in right hip: Secondary | ICD-10-CM

## 2024-07-05 DIAGNOSIS — G8929 Other chronic pain: Secondary | ICD-10-CM | POA: Diagnosis not present

## 2024-07-05 MED ORDER — MELOXICAM 15 MG PO TABS: ORAL_TABLET | ORAL | 0 refills | Status: AC

## 2024-07-05 NOTE — Patient Instructions (Addendum)
 Back and hip HEP   Pt referral   - Start meloxicam  15 mg daily x2 weeks.  If still having pain after 2 weeks, complete 3rd-week of NSAID. May use remaining NSAID as needed once daily for pain control.  Do not to use additional over-the-counter NSAIDs (ibuprofen , naproxen, Advil , Aleve, etc.) while taking prescription NSAIDs.  May use Tylenol  [phone] mg 2 to 3 times a day for breakthrough pain.  6 week follow up

## 2024-07-06 ENCOUNTER — Ambulatory Visit: Admitting: Physical Therapy

## 2024-07-11 ENCOUNTER — Ambulatory Visit: Admitting: Physical Therapy

## 2024-07-12 ENCOUNTER — Other Ambulatory Visit: Payer: Self-pay | Admitting: Internal Medicine

## 2024-07-12 ENCOUNTER — Ambulatory Visit: Admitting: Internal Medicine

## 2024-07-13 ENCOUNTER — Encounter: Payer: Self-pay | Admitting: Internal Medicine

## 2024-07-13 ENCOUNTER — Ambulatory Visit: Admitting: Physical Therapy

## 2024-07-14 MED ORDER — AMPHETAMINE-DEXTROAMPHETAMINE 20 MG PO TABS: 20.0000 mg | ORAL_TABLET | Freq: Two times a day (BID) | ORAL | 0 refills | Status: AC

## 2024-07-15 ENCOUNTER — Other Ambulatory Visit: Payer: Self-pay

## 2024-07-15 ENCOUNTER — Ambulatory Visit: Admitting: Physical Therapy

## 2024-07-15 ENCOUNTER — Encounter: Payer: Self-pay | Admitting: Physical Therapy

## 2024-07-15 DIAGNOSIS — M6281 Muscle weakness (generalized): Secondary | ICD-10-CM | POA: Diagnosis not present

## 2024-07-15 DIAGNOSIS — M5459 Other low back pain: Secondary | ICD-10-CM

## 2024-07-15 DIAGNOSIS — M25551 Pain in right hip: Secondary | ICD-10-CM

## 2024-07-15 NOTE — Patient Instructions (Signed)
 Access Code: [email or web address] URL: [email or web address] Date: 07/15/2024 Prepared by: Elaine Daring  Exercises - Supine March with Posterior Pelvic Tilt  - 1 x daily - 2 sets - 10 reps - Supine Sciatic Nerve Glide  - 1 x daily - 2 sets - 10 reps - Supine Bridge with Spinal Articulation  - 1 x daily - 3 sets - 5 reps - 5 seconds hold - Cat Cow  - 1 x daily - 3 sets - 5 reps

## 2024-07-15 NOTE — Therapy (Signed)
 OUTPATIENT PHYSICAL THERAPY EVALUATION   Patient Name: Lisa Duran MRN: [id number] DOB:[birth year], 43 y.o., female Today's Date: 07/15/2024   END OF SESSION:  PT End of Session - 07/15/24 0937     Visit Number 1    Number of Visits 17    Date for Recertification  09/09/24    Authorization Type [employer]    PT Start Time 0930    PT Stop Time 1025    PT Time Calculation (min) 55 min    Activity Tolerance Patient tolerated treatment well    Behavior During Therapy [facility] for tasks assessed/performed          Past Medical History:  Diagnosis Date   ADHD (attention deficit hyperactivity disorder)    ALLERGIC RHINITIS    Allergy    Anemia    ANXIETY    Arthritis    left knee   Complication of anesthesia    2017 propofol  for EGD chest tightness, SOB. 2024 surgery had nystagmus after surgery   Depression    DIABETES MELLITUS, TYPE II    Dysrhythmia    Sinus Tachycardia - On Metoprolol    Fibromyalgia 2018   GERD (gastroesophageal reflux disease)    History of hiatal hernia    small hernia found on scan   History of kidney stones    MIGRAINE, COMMON    Obesity, unspecified    OVARIAN CYST    Past Surgical History:  Procedure Laterality Date   HIP ARTHROSCOPY W/ LABRAL REPAIR Right 07/2023   KNEE ARTHROSCOPY Left 08/25/2022   TOTAL KNEE ARTHROPLASTY Left 03/31/2024   Procedure: ARTHROPLASTY, KNEE, TOTAL;  Surgeon: Addie Cordella Hamilton, MD;  Location: [facility] OR;  Service: Orthopedics;  Laterality: Left;   Patient Active Problem List   Diagnosis Date Noted   Diabetes mellitus treated with injections of non-insulin  medication (HCC) 05/31/2024   Diabetes mellitus treated with oral medication (HCC) 05/31/2024   Status post left knee replacement 03/31/2024   Tear of right acetabular labrum 07/21/2023   B12 deficiency 05/09/2021   Hyperlipidemia associated with type 2 diabetes mellitus (HCC) 04/14/2019   Enteritis 01/12/2019   Type 2 diabetes mellitus with  hyperlipidemia (HCC) 01/11/2019   Sleep walking disorder 04/06/2017   Iron  deficiency anemia 03/27/2017   Fibromyalgia 02/03/2017   GERD (gastroesophageal reflux disease) 06/24/2015   Eczema 06/22/2015   Depression 06/22/2015   Tachycardia 06/16/2014   Food allergy    ADHD (attention deficit hyperactivity disorder) 01/27/2011   Anxiety 02/20/2010   Allergic rhinitis 11/21/2009   OVARIAN CYST 11/21/2009   Diabetes mellitus type 2 with neurological manifestations (HCC) 11/20/2009   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 11/20/2009   Migraine without aura 11/20/2009    PCP: Geofm Glade PARAS, MD  REFERRING PROVIDER: Leonce Katz, DO  REFERRING DIAG: Right hip pain; Chronic right-sided low back pain with right-sided sciatica  Rationale for Evaluation and Treatment: Rehabilitation  THERAPY DIAG:  Pain in right hip  Other low back pain  Muscle weakness (generalized)  ONSET DATE: Chronic   SUBJECTIVE:       SUBJECTIVE STATEMENT: Patient reports right labrum repair last December, and has still been experiencing right hip and lower back pain that seems to extend to her right big toe and foot, muscle spasms, and extending up to her right shoulder blade, a numb and tingling feeling. The right big toe and foot spasms occur daily and mostly at night. The right hip and  back pain feels different than the labral pain she was having before. She was doing a lot of lifting last month so that may have aggravated the back more. The left knee is doing well, she did start trying to do kneeling at home. Currently she has difficulty and pain with sitting and driving, walking extended distances can aggravate the hip, pretty much anything more than lower level activity can aggravate right hip and back.  PERTINENT HISTORY:  Right hip arthroscopy with labral repair - 07/21/2023 Left TKA - 03/31/2024  PAIN:  Are you having pain? Yes:  NPRS scale: 2-3/10 at  rest, 5-6/10 at worst Pain location: Right hip and back region Pain description: Right hip - ache, lower back - tight, burn, numb, tingling Aggravating factors: Sitting/driving, walking, bending, lying on right side Relieving factors: Medication  PRECAUTIONS: None  RED FLAGS: None   WEIGHT BEARING RESTRICTIONS: No  FALLS:  Has patient fallen in last 6 months? No  OCCUPATION: Sitting and standing, more sitting currently  PLOF: Independent  PATIENT GOALS: Pain relief, improve sitting comfort, get back to hiking   OBJECTIVE:  Note: Objective measures were completed at Evaluation unless otherwise noted. PATIENT SURVEYS:  PSFS: 3.67 Driving: 5 Exercises / strength training: 2 Hiking: 4  COGNITION: Overall cognitive status: Within functional limits for tasks assessed     SENSATION: WFL  MUSCLE LENGTH: Right hamstring limited with pinching noted anterior hip/groin region, with addition of ankle DF reports pain down back of right leg to foot  POSTURE:   Slight rounded shoulder posture  PALPATION: Exquisite tenderness noted right glute med and max region, tenderness over greater trochanter and TFL region, tenderness right lumbar paraspinal and QL region, tenderness right lower periscapular region  LUMBAR ROM:   AROM eval  Flexion WFL  Extension 50% Bilateral lower back pain  Right lateral flexion WFL Right lower back pain  Left lateral flexion WFL Right hip pain  Right rotation 50% Right subscapular pain  Left rotation 50%  Bilateral thoracic tightness   (Blank rows = not tested)  LOWER EXTREMITY ROM:    Right hip flexion and IR limited with report of pain/pinching anterior hip and groin region  LOWER EXTREMITY MMT:    MMT Right eval Left eval  Hip flexion 4- 4  Hip extension 4- 4  Hip abduction 3+ 4-  Hip adduction    Hip internal rotation    Hip external rotation    Knee flexion 5 5  Knee extension 5 5  Ankle dorsiflexion    Ankle plantarflexion     Ankle inversion    Ankle eversion     (Blank rows = not tested)  FUNCTIONAL TESTS:  Not formally assessed at eval  GAIT: Assistive device utilized: None Level of assistance: Complete Independence Comments: Antalgic on right   TREATMENT  [facility] Adult PT Treatment:                                                DATE: 07/15/2024 Hooklying pelvic tilt with alternating march x 10 Hooklying sciatic nerve floss using a strap x 10 right Bridge articulating 5 x 5 sec Cat cow focus on pelvic tilt x 5  Spent time discussing possible etiologies of her symptoms including right greater trochanteric pain, right hip intraarticular pathology, lumbar radiculopathy, spinal mobility and postural deficits. Discussed goals of therapy and  various treatment option that can include TPDN.  PATIENT EDUCATION:  Education details: Exam findings, POC, HEP Person educated: Patient Education method: Explanation, Demonstration, Tactile cues, Verbal cues, and Handouts Education comprehension: verbalized understanding, returned demonstration, verbal cues required, tactile cues required, and needs further education  HOME EXERCISE PROGRAM: Access Code: [email or web address]   ASSESSMENT: CLINICAL IMPRESSION: Patient is a 42 y.o. female who was seen today for physical therapy evaluation and treatment for chronic right hip and lower/mid back pain, as well as right foot/great toe spasms. Her symptoms seem multifactorial, but primarily related to right glute med and hip intraarticular pain. She does have significant strength deficits of the right hip and also with limited hip motion due to pinching pain at the anterior hip and groin region. She does exhibit limitations in her spinal motion with tenderness of the right sided trunk musculature, and right sciatic neurodynamic deficits. She reports pain is impacting her functional ability that limit tasks such as sitting, walking, hiking, bending.  OBJECTIVE IMPAIRMENTS: Abnormal gait,  decreased activity tolerance, decreased ROM, decreased strength, impaired flexibility, postural dysfunction, and pain.   ACTIVITY LIMITATIONS: lifting, sitting, standing, squatting, sleeping, stairs, locomotion level, and caring for others  PARTICIPATION LIMITATIONS: meal prep, cleaning, laundry, driving, shopping, community activity, and occupation  PERSONAL FACTORS: Fitness, Past/current experiences, and Time since onset of injury/illness/exacerbation are also affecting patient's functional outcome.   REHAB POTENTIAL: Good  CLINICAL DECISION MAKING: Stable/uncomplicated  EVALUATION COMPLEXITY: Low   GOALS: Goals reviewed with patient? Yes  SHORT TERM GOALS: Target date: 08/12/2024  Patient will be I with initial HEP in order to progress with therapy. Baseline: HEP provided at eval Goal status: INITIAL  2.  Patient will report right hip/lower back pain with activity </= 3/10 in order to reduce functional limitations Baseline: 5-6/10 Goal status: INITIAL  LONG TERM GOALS: Target date: 09/09/2024  Patient will be I with final HEP to maintain progress from PT. Baseline: HEP provided at eval Goal status: INITIAL  2.  Patient will report PSFS >/= 7 in order to indicate improvement in their functional ability. Baseline: 3.67 Goal status: INITIAL  3.  Patient will demonstrate hip strength >/= 4/5 MMT in order to improve her activity tolerance and reduce pain with walking/hiking Baseline: see limitations above Goal status: INITIAL  4.  Patient will demonstrate hip PROM and lumbar AROM without increase in pain to improve her functional mobility and reduce pain with activity and sitting Baseline: see limitations above Goal status: INITIAL   PLAN: PT FREQUENCY: 2x/week  PT DURATION: 8 weeks  PLANNED INTERVENTIONS: 97164- PT Re-evaluation, 97750- Physical Performance Testing, 97110-Therapeutic exercises, 97530- Therapeutic activity, 97112- Neuromuscular re-education, 97535- Self  Care, 02859- Manual therapy, 97116- Gait training, [phone] (1-2 muscles), 20561 (3+ muscles)- Dry Needling, Patient/Family education, Balance training, Stair training, Taping, Joint mobilization, Joint manipulation, Spinal manipulation, Spinal mobilization, Cryotherapy, and Moist heat.  PLAN FOR NEXT SESSION: Review HEP and progress PRN, manual/TPDN for right gluteal and lumbar region, mobs/LAD for right hip and improve lumbar and thoracic mobility, progress core stabilization and hip strengthening as tolerated   Elaine Daring, PT, DPT, LAT, ATC 07/15/24  11:28 AM Phone: [phone] Fax: [phone]

## 2024-07-19 ENCOUNTER — Encounter: Payer: Self-pay | Admitting: Physical Therapy

## 2024-07-19 ENCOUNTER — Ambulatory Visit: Admitting: Physical Therapy

## 2024-07-19 ENCOUNTER — Other Ambulatory Visit: Payer: Self-pay

## 2024-07-19 DIAGNOSIS — M5459 Other low back pain: Secondary | ICD-10-CM

## 2024-07-19 DIAGNOSIS — M6281 Muscle weakness (generalized): Secondary | ICD-10-CM

## 2024-07-19 DIAGNOSIS — M25551 Pain in right hip: Secondary | ICD-10-CM

## 2024-07-19 NOTE — Therapy (Signed)
 OUTPATIENT PHYSICAL THERAPY TREATMENT   Patient Name: Lisa Duran MRN: [id number] DOB:1981/06/17, 43 y.o., female Today's Date: 07/19/2024   END OF SESSION:  PT End of Session - 07/19/24 1020     Visit Number 2    Number of Visits 17    Date for Recertification  09/09/24    Authorization Type U[employer]    PT Start Time 1016    PT Stop Time 1100    PT Time Calculation (min) 44 min    Activity Tolerance Patient tolerated treatment well    Behavior During Therapy WFL for tasks assessed/performed           Past Medical History:  Diagnosis Date   ADHD (attention deficit hyperactivity disorder)    ALLERGIC RHINITIS    Allergy    Anemia    ANXIETY    Arthritis    left knee   Complication of anesthesia    2017 propofol  for EGD chest tightness, SOB. 2024 surgery had nystagmus after surgery   Depression    DIABETES MELLITUS, TYPE II    Dysrhythmia    Sinus Tachycardia - On Metoprolol    Fibromyalgia 2018   GERD (gastroesophageal reflux disease)    History of hiatal hernia    small hernia found on scan   History of kidney stones    MIGRAINE, COMMON    Obesity, unspecified    OVARIAN CYST    Past Surgical History:  Procedure Laterality Date   HIP ARTHROSCOPY W/ LABRAL REPAIR Right 07/2023   KNEE ARTHROSCOPY Left 08/25/2022   TOTAL KNEE ARTHROPLASTY Left 03/31/2024   Procedure: ARTHROPLASTY, KNEE, TOTAL;  Surgeon: Addie Cordella Hamilton, MD;  Location: [facility] OR;  Service: Orthopedics;  Laterality: Left;   Patient Active Problem List   Diagnosis Date Noted   Diabetes mellitus treated with injections of non-insulin  medication (HCC) 05/31/2024   Diabetes mellitus treated with oral medication (HCC) 05/31/2024   Status post left knee replacement 03/31/2024   Tear of right acetabular labrum 07/21/2023   B12 deficiency 05/09/2021   Hyperlipidemia associated with type 2 diabetes mellitus (HCC) 04/14/2019   Enteritis 01/12/2019   Type 2 diabetes mellitus with  hyperlipidemia (HCC) 01/11/2019   Sleep walking disorder 04/06/2017   Iron  deficiency anemia 03/27/2017   Fibromyalgia 02/03/2017   GERD (gastroesophageal reflux disease) 06/24/2015   Eczema 06/22/2015   Depression 06/22/2015   Tachycardia 06/16/2014   Food allergy    ADHD (attention deficit hyperactivity disorder) 01/27/2011   Anxiety 02/20/2010   Allergic rhinitis 11/21/2009   OVARIAN CYST 11/21/2009   Diabetes mellitus type 2 with neurological manifestations (HCC) 11/20/2009   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 She states she did a little more walking this weekend which flared the right hip and left knee up. Just driving here today flared up her right upper back area.   Eval: Patient reports right labrum repair last December, and has still been experiencing right hip and lower back pain  that seems to extend to her right big toe and foot, muscle spasms, and extending up to her right shoulder blade, a numb and tingling feeling. The right big toe and foot spasms occur daily and mostly at night. The right hip and back pain feels different than the labral pain she was having before. She was doing a lot of lifting last month so that may have aggravated the back more. The left knee is doing well, she did start trying to do kneeling at home. Currently she has difficulty and pain with sitting and driving, walking extended distances can aggravate the hip, pretty much anything more than  lower level activity can aggravate right hip and back.  PERTINENT HISTORY:  Right hip arthroscopy with labral repair - 07/21/2023 Left TKA - 03/31/2024  PAIN:  Are you having pain? Yes:  NPRS scale: 2-3/10 at rest, 5-6/10 at worst Pain location: Right hip and back region Pain description: Right hip - ache, lower back - tight, burn, numb, tingling Aggravating factors: Sitting/driving, walking, bending, lying on right side Relieving factors: Medication  PRECAUTIONS: None  PATIENT GOALS: Pain relief, improve sitting comfort, get back to hiking   OBJECTIVE:  Note: Objective measures were completed at Evaluation unless otherwise noted. PATIENT SURVEYS:  PSFS: 3.67 Driving: 5 Exercises / strength training: 2 Hiking: 4  MUSCLE LENGTH: Right hamstring limited with pinching noted anterior hip/groin region, with addition of ankle DF reports pain down back of right leg to foot  POSTURE:   Slight rounded shoulder posture  PALPATION: Exquisite tenderness noted right glute med and max region, tenderness over greater trochanter and TFL region, tenderness right lumbar paraspinal and QL region, tenderness right lower periscapular region  LUMBAR ROM:   AROM eval  Flexion WFL  Extension 50% Bilateral lower back pain  Right lateral flexion WFL Right lower back pain  Left lateral flexion WFL Right hip pain  Right rotation 50% Right subscapular pain  Left rotation 50%  Bilateral thoracic tightness   (Blank rows = not tested)  LOWER EXTREMITY ROM:    Right hip flexion and IR limited with report of pain/pinching anterior hip and groin region  LOWER EXTREMITY MMT:    MMT Right eval Left eval  Hip flexion 4- 4  Hip extension 4- 4  Hip abduction 3+ 4-  Hip adduction    Hip internal rotation    Hip external rotation    Knee flexion 5 5  Knee extension 5 5  Ankle dorsiflexion    Ankle plantarflexion    Ankle inversion    Ankle eversion     (Blank rows = not  tested)  FUNCTIONAL TESTS:  Not formally assessed at eval  GAIT: Assistive device utilized: None Level of assistance: Complete Independence Comments: Antalgic on right   TREATMENT  [facility] Adult PT Treatment:                                                DATE: 07/18/2024 Recumbent bike L4 x 5 to improve endurance and workload capacity Supine LAD using belt for right LE Hooklying right lateral hip joint mobs using belt Hooklying right posterior hip mobs with hip at 90 deg flexion Hooklying PPT adductor ball squeeze 10 x 5 sec  Hooklying pelvic tilt with alternating march x 10 Hooklying PPT hip abduction isometric with black 10 x 5 sec Cat cow  focus on pelvic tilt x 5 Quadruped PPT rock back x 5 Prone I 10 x 5 sec  PATIENT EDUCATION:  Education details: HEP update Person educated: Patient Education method: Explanation, Demonstration, Tactile cues, Verbal cues, Handout Education comprehension: verbalized understanding, returned demonstration, verbal cues required, tactile cues required, and needs further education  HOME EXERCISE PROGRAM: Access Code: [email or web address]   ASSESSMENT: CLINICAL IMPRESSION: Patient tolerated therapy well with no adverse effects. Therapy focused on improving right hip mobility and progressing core/gluteal activation and scapular control with good tolerance. She did report feeling a good stretch with the right hip mobs and was able to progress with hip mobility in quadruped position. Progressed with hip abductor isometrics and incorporated scapular exercise, and she did exhibit shaking of the right hand when performing the prone scapular exercise. Updated her HEP to include hip abductor isometric. Patient would benefit from continued skilled PT to progress mobility and strength in order to reduce pain and maximize functional ability.   Eval: Patient is a 43 y.o. female who was seen today for physical therapy evaluation and treatment for chronic right hip and  lower/mid back pain, as well as right foot/great toe spasms. Her symptoms seem multifactorial, but primarily related to right glute med and hip intraarticular pain. She does have significant strength deficits of the right hip and also with limited hip motion due to pinching pain at the anterior hip and groin region. She does exhibit limitations in her spinal motion with tenderness of the right sided trunk musculature, and right sciatic neurodynamic deficits. She reports pain is impacting her functional ability that limit tasks such as sitting, walking, hiking, bending.  OBJECTIVE IMPAIRMENTS: Abnormal gait, decreased activity tolerance, decreased ROM, decreased strength, impaired flexibility, postural dysfunction, and pain.   ACTIVITY LIMITATIONS: lifting, sitting, standing, squatting, sleeping, stairs, locomotion level, and caring for others  PARTICIPATION LIMITATIONS: meal prep, cleaning, laundry, driving, shopping, community activity, and occupation  PERSONAL FACTORS: Fitness, Past/current experiences, and Time since onset of injury/illness/exacerbation are also affecting patient's functional outcome.    GOALS: Goals reviewed with patient? Yes  SHORT TERM GOALS: Target date: 08/12/2024  Patient will be I with initial HEP in order to progress with therapy. Baseline: HEP provided at eval Goal status: INITIAL  2.  Patient will report right hip/lower back pain with activity </= 3/10 in order to reduce functional limitations Baseline: 5-6/10 Goal status: INITIAL  LONG TERM GOALS: Target date: 09/09/2024  Patient will be I with final HEP to maintain progress from PT. Baseline: HEP provided at eval Goal status: INITIAL  2.  Patient will report PSFS >/= 7 in order to indicate improvement in their functional ability. Baseline: 3.67 Goal status: INITIAL  3.  Patient will demonstrate hip strength >/= 4/5 MMT in order to improve her activity tolerance and reduce pain with  walking/hiking Baseline: see limitations above Goal status: INITIAL  4.  Patient will demonstrate hip PROM and lumbar AROM without increase in pain to improve her functional mobility and reduce pain with activity and sitting Baseline: see limitations above Goal status: INITIAL   PLAN: PT FREQUENCY: 2x/week  PT DURATION: 8 weeks  PLANNED INTERVENTIONS: 97164- PT Re-evaluation, 97750- Physical Performance Testing, 97110-Therapeutic exercises, 97530- Therapeutic activity, 97112- Neuromuscular re-education, 97535- Self Care, 02859- Manual therapy, 97116- Gait training, [phone] (1-2 muscles), 20561 (3+ muscles)- Dry Needling, Patient/Family education, Balance training, Stair training, Taping, Joint mobilization, Joint manipulation, Spinal manipulation, Spinal mobilization, Cryotherapy, and Moist heat.  PLAN FOR NEXT SESSION: Review HEP and progress  PRN, manual/TPDN for right gluteal and lumbar region, mobs/LAD for right hip and improve lumbar and thoracic mobility, progress core stabilization and hip strengthening as tolerated   Elaine Daring, PT, DPT, LAT, ATC 07/19/24  11:23 AM Phone: [phone] Fax: [phone]

## 2024-07-19 NOTE — Patient Instructions (Signed)
 Access Code: [email or web address] URL: [email or web address] Date: 07/19/2024 Prepared by: Elaine Daring  Exercises - Supine March with Posterior Pelvic Tilt  - 1 x daily - 2 sets - 10 reps - Supine Sciatic Nerve Glide  - 1 x daily - 2 sets - 10 reps - Supine Bridge with Spinal Articulation  - 1 x daily - 3 sets - 5 reps - 5 seconds hold - Cat Cow  - 1 x daily - 3 sets - 5 reps - Hooklying Clamshell with Resistance  - 1 x daily - 2 sets - 10 reps - 5 seconds hold

## 2024-07-21 ENCOUNTER — Other Ambulatory Visit: Payer: Self-pay

## 2024-07-21 ENCOUNTER — Encounter: Payer: Self-pay | Admitting: Physical Therapy

## 2024-07-21 ENCOUNTER — Ambulatory Visit: Admitting: Physical Therapy

## 2024-07-21 DIAGNOSIS — M5459 Other low back pain: Secondary | ICD-10-CM | POA: Diagnosis not present

## 2024-07-21 DIAGNOSIS — M6281 Muscle weakness (generalized): Secondary | ICD-10-CM | POA: Diagnosis not present

## 2024-07-21 DIAGNOSIS — M25551 Pain in right hip: Secondary | ICD-10-CM

## 2024-07-21 NOTE — Therapy (Signed)
 OUTPATIENT PHYSICAL THERAPY TREATMENT   Patient Name: Lisa Duran MRN: [id number] DOB:08-Sep-1980, 43 y.o., female Today's Date: 07/21/2024   END OF SESSION:  PT End of Session - 07/21/24 1003     Visit Number 3    Number of Visits 17    Date for Recertification  09/09/24    Authorization Type [employer]    PT Start Time [phone]    PT Stop Time 1012    PT Time Calculation (min) 41 min    Activity Tolerance Patient tolerated treatment well    Behavior During Therapy [facility] for tasks assessed/performed            Past Medical History:  Diagnosis Date   ADHD (attention deficit hyperactivity disorder)    ALLERGIC RHINITIS    Allergy    Anemia    ANXIETY    Arthritis    left knee   Complication of anesthesia    2017 propofol  for EGD chest tightness, SOB. 2024 surgery had nystagmus after surgery   Depression    DIABETES MELLITUS, TYPE II    Dysrhythmia    Sinus Tachycardia - On Metoprolol    Fibromyalgia 2018   GERD (gastroesophageal reflux disease)    History of hiatal hernia    small hernia found on scan   History of kidney stones    MIGRAINE, COMMON    Obesity, unspecified    OVARIAN CYST    Past Surgical History:  Procedure Laterality Date   HIP ARTHROSCOPY W/ LABRAL REPAIR Right 07/2023   KNEE ARTHROSCOPY Left 08/25/2022   TOTAL KNEE ARTHROPLASTY Left 03/31/2024   Procedure: ARTHROPLASTY, KNEE, TOTAL;  Surgeon: Addie Cordella Hamilton, MD;  Location: [facility] OR;  Service: Orthopedics;  Laterality: Left;   Patient Active Problem List   Diagnosis Date Noted   Diabetes mellitus treated with injections of non-insulin  medication (HCC) 05/31/2024   Diabetes mellitus treated with oral medication (HCC) 05/31/2024   Status post left knee replacement 03/31/2024   Tear of right acetabular labrum 07/21/2023   B12 deficiency 05/09/2021   Hyperlipidemia associated with type 2 diabetes mellitus (HCC) 04/14/2019   Enteritis 01/12/2019   Type 2 diabetes mellitus with  hyperlipidemia (HCC) 01/11/2019   Sleep walking disorder 04/06/2017   Iron  deficiency anemia 03/27/2017   Fibromyalgia 02/03/2017   GERD (gastroesophageal reflux disease) 06/24/2015   Eczema 06/22/2015   Depression 06/22/2015   Tachycardia 06/16/2014   Food allergy    ADHD (attention deficit hyperactivity disorder) 01/27/2011   Anxiety 02/20/2010   Allergic rhinitis 11/21/2009   OVARIAN CYST 11/21/2009   Diabetes mellitus type 2 with neurological manifestations (HCC) 11/20/2009   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 She states this morning trying to do her hair this morning and her neck/shoulder felt really weak and tight.   Eval: Patient reports right labrum repair last December, and has still been experiencing right hip and lower back pain that seems to extend to her right big toe and foot, muscle  spasms, and extending up to her right shoulder blade, a numb and tingling feeling. The right big toe and foot spasms occur daily and mostly at night. The right hip and back pain feels different than the labral pain she was having before. She was doing a lot of lifting last month so that may have aggravated the back more. The left knee is doing well, she did start trying to do kneeling at home. Currently she has difficulty and pain with sitting and driving, walking extended distances can aggravate the hip, pretty much anything more than lower level activity can aggravate right hip and  back.  PERTINENT HISTORY:  Right hip arthroscopy with labral repair - 07/21/2023 Left TKA - 03/31/2024  PAIN:  Are you having pain? Yes:  NPRS scale: 2-3/10 at rest, 5-6/10 at worst Pain location: Right hip and back region Pain description: Right hip - ache, lower back - tight, burn, numb, tingling Aggravating factors: Sitting/driving, walking, bending, lying on right side Relieving factors: Medication  PRECAUTIONS: None  PATIENT GOALS: Pain relief, improve sitting comfort, get back to hiking   OBJECTIVE:  Note: Objective measures were completed at Evaluation unless otherwise noted. PATIENT SURVEYS:  PSFS: 3.67 Driving: 5 Exercises / strength training: 2 Hiking: 4  MUSCLE LENGTH: Right hamstring limited with pinching noted anterior hip/groin region, with addition of ankle DF reports pain down back of right leg to foot  POSTURE:   Slight rounded shoulder posture  PALPATION: Exquisite tenderness noted right glute med and max region, tenderness over greater trochanter and TFL region, tenderness right lumbar paraspinal and QL region, tenderness right lower periscapular region  LUMBAR ROM:   AROM eval  Flexion WFL  Extension 50% Bilateral lower back pain  Right lateral flexion WFL Right lower back pain  Left lateral flexion WFL Right hip pain  Right rotation 50% Right subscapular pain  Left rotation 50%  Bilateral thoracic tightness   (Blank rows = not tested)  LOWER EXTREMITY ROM:    Right hip flexion and IR limited with report of pain/pinching anterior hip and groin region  LOWER EXTREMITY MMT:    MMT Right eval Left eval  Hip flexion 4- 4  Hip extension 4- 4  Hip abduction 3+ 4-  Hip adduction    Hip internal rotation    Hip external rotation    Knee flexion 5 5  Knee extension 5 5  Ankle dorsiflexion    Ankle plantarflexion    Ankle inversion    Ankle eversion     (Blank rows = not tested)  FUNCTIONAL TESTS:  Not formally assessed at  eval  GAIT: Assistive device utilized: None Level of assistance: Complete Independence Comments: Antalgic on right   TREATMENT  [facility] Adult PT Treatment:                                                DATE: 07/21/2024 Suboccipital release with gentle manual traction Supine cervical retraction with small towel roll 10 x 5 sec Seated cervical retraction with fingertip assist at chin 10 x 5 sec Seated thoracic extension over back of chair 5 x 5 sec Sidelying thoracic rotation x 10 each Hooklying PPT hip abduction isometric with black 10 x 5 sec Bridge with black at knees 2 x 5 x 5 sec Seated double ER and scap retraction with yellow 2 x 10  PATIENT EDUCATION:  Education details: HEP update Person educated: Patient Education method: Explanation, Demonstration, Tactile cues, Verbal cues, Handout Education comprehension: verbalized understanding, returned demonstration, verbal cues required, tactile cues required, and needs further education  HOME EXERCISE PROGRAM: Access Code: [email or web address]   ASSESSMENT: CLINICAL IMPRESSION: Patient tolerated therapy well with no adverse effects. Therapy incorporated more cervical treatment this visit as this does seem to be related to the right thoracic/scapular pain she experiences. She tolerated addition of DNF training well and was able to perform seated periscapular exercise without having the shaking movement of the right hand. She was able to progress with her glute strengthening without report of increased pain. Updated her HEP to progress glute strengthening and cervical and periscapular exercises. Patient would benefit from continued skilled PT to progress mobility and strength in order to reduce pain and maximize functional ability.   Eval: Patient is a 43 y.o. female who was seen today for physical therapy evaluation and treatment for chronic right hip and lower/mid back pain, as well as right foot/great toe spasms. Her symptoms seem  multifactorial, but primarily related to right glute med and hip intraarticular pain. She does have significant strength deficits of the right hip and also with limited hip motion due to pinching pain at the anterior hip and groin region. She does exhibit limitations in her spinal motion with tenderness of the right sided trunk musculature, and right sciatic neurodynamic deficits. She reports pain is impacting her functional ability that limit tasks such as sitting, walking, hiking, bending.  OBJECTIVE IMPAIRMENTS: Abnormal gait, decreased activity tolerance, decreased ROM, decreased strength, impaired flexibility, postural dysfunction, and pain.   ACTIVITY LIMITATIONS: lifting, sitting, standing, squatting, sleeping, stairs, locomotion level, and caring for others  PARTICIPATION LIMITATIONS: meal prep, cleaning, laundry, driving, shopping, community activity, and occupation  PERSONAL FACTORS: Fitness, Past/current experiences, and Time since onset of injury/illness/exacerbation are also affecting patient's functional outcome.    GOALS: Goals reviewed with patient? Yes  SHORT TERM GOALS: Target date: 08/12/2024  Patient will be I with initial HEP in order to progress with therapy. Baseline: HEP provided at eval Goal status: INITIAL  2.  Patient will report right hip/lower back pain with activity </= 3/10 in order to reduce functional limitations Baseline: 5-6/10 Goal status: INITIAL  LONG TERM GOALS: Target date: 09/09/2024  Patient will be I with final HEP to maintain progress from PT. Baseline: HEP provided at eval Goal status: INITIAL  2.  Patient will report PSFS >/= 7 in order to indicate improvement in their functional ability. Baseline: 3.67 Goal status: INITIAL  3.  Patient will demonstrate hip strength >/= 4/5 MMT in order to improve her activity tolerance and reduce pain with walking/hiking Baseline: see limitations above Goal status: INITIAL  4.  Patient will demonstrate  hip PROM and lumbar AROM without increase in pain to improve her functional mobility and reduce pain with activity and sitting Baseline: see limitations above Goal status: INITIAL   PLAN: PT FREQUENCY: 2x/week  PT DURATION: 8 weeks  PLANNED INTERVENTIONS: 97164- PT Re-evaluation, 97750- Physical Performance Testing, 97110-Therapeutic exercises, 97530- Therapeutic activity, 97112- Neuromuscular re-education, 97535- Self Care, [id number]- Manual therapy, 97116- Gait training, [phone] (1-2 muscles), [id number] (3+ muscles)- Dry Needling, Patient/Family education, Balance training, Stair training, Taping, Joint mobilization, Joint manipulation, Spinal manipulation, Spinal mobilization, Cryotherapy, and Moist heat.  PLAN FOR NEXT SESSION: Review HEP and progress PRN, manual/TPDN for right gluteal and lumbar region, mobs/LAD for right hip and improve lumbar and thoracic mobility, progress core  stabilization and hip strengthening as tolerated   Elaine Daring, PT, DPT, LAT, ATC 07/21/2024  10:18 AM Phone: [phone] Fax: [phone]

## 2024-07-21 NOTE — Patient Instructions (Signed)
 Access Code: [email or web address] URL: [email or web address] Date: 07/21/2024 Prepared by: Elaine Daring  Exercises - Supine March with Posterior Pelvic Tilt  - 1 x daily - 2 sets - 10 reps - Supine Sciatic Nerve Glide  - 1 x daily - 2 sets - 10 reps - Bridge with Resistance  - 1 x daily - 3 sets - 5 reps - 5 seconds hold - Cat Cow  - 1 x daily - 3 sets - 5 reps - Seated Passive Cervical Retraction  - 1 x daily - 2 sets - 10 reps - 5 seconds hold - Shoulder External Rotation and Scapular Retraction with Resistance  - 1 x daily - 2 sets - 10 reps

## 2024-07-26 ENCOUNTER — Encounter: Admitting: Physical Therapy

## 2024-07-28 ENCOUNTER — Ambulatory Visit: Admitting: Internal Medicine

## 2024-07-28 ENCOUNTER — Encounter: Admitting: Physical Therapy

## 2024-07-28 NOTE — Progress Notes (Deleted)
 =ubjective:     Patient ID: Lisa Duran, female   DOB: 1980-11-27, 43 y.o.   MRN: [id number]  HPI Lisa Duran is a pleasant 43 y.o.  woman returning for f/u for DM2, non insulin  dependent now, uncontrolled, dx 05/2009, w/o complications. Last visit 4.5 months ago.  Interim history: No increased urination, blurry vision, nausea, chest pain. She lost approximately 70 pounds soon after starting  Ozempic . However, she gained some of the weight back after having had a torn meniscus and knee surgery in 08/2022.  Afterwards, she developed right hip pain due to tear of right acetabular labrum >> she had surgery 07/2023 and then knee surgery 08/2023. She was not able to walk consistently - was in PT then got gel knee injections but they did not work.  Since last visit, she had a left TKR 03/31/2024.    Reviewed HbA1c levels: Lab Results  Component Value Date   HGBA1C 6.7 (H) 03/29/2024   HGBA1C 7.1 (A) 03/10/2024   HGBA1C 6.8 (A) 11/04/2023   HGBA1C 6.7 (H) 05/22/2023   HGBA1C 6.4 03/02/2023   HGBA1C 6.5 (A) 10/31/2022   HGBA1C 6.9 (A) 06/27/2022   HGBA1C 7.0 (A) 02/20/2022   HGBA1C 6.9 (A) 10/08/2021   HGBA1C 7.6 (A) 07/01/2021   She is on:   - Metformin  1000 mg 2x a day  - Ozempic  0.5 mg weekly - After starting Ozempic , she had constipation, gas, nausea, but these improved.  She continues on Pepcid  and Mylanta prn. We stopped glipizide  ER and also Januvia . We tried Cycloset  0.8 mg daily >> stopped end of 11/2016 b/c GERD/AP/C We tried Trulicity  >> could not tolerate it: N/V/AP. She restarted Januvia  09/2016. She was on Invokana  100 mg in am (started 12/2014) >> 2 yeast inf >> tx with Diflucan ; she was exhausted and had nocturia >> stopped 03/12/2015 Tried Victoza  2 mo ago >> AP, severe GERD, inj site rxn Tried Januvia  >> tolerated it well, but stopped when Invokana  was suggested.  She was also on Actos, but taken off b/c good control. She had problems tolerating insulins in the past.  She was  previously on Lantus  and NovoLog .  Basaglar  and Tresiba  caused eczema and also GI symptoms.  We then switched to NPH (approximately 50 units a day) and regular insulin  but she was able to come off due to good control after starting Ozempic :  She has a One Touch Verio meter >> True Metrix.  She checks her sugars 1x a day: - am: 105-145, 157 >> 114-140 - 2h after b'fast: n/c  - lunch: 114, 134 >> n/c - 2h after lunch: n/c - dinner: 126 >> n/c - 2h after dinner: n/c >> 185-190 - bedtime: n/c  Prev.  On the CGM, but not covered anymore due to not being on insulin .   Prev.:  Lowest: 97 >> 114 Highest: 190 >> 190 She has hypoglycemia awareness in the 80s.  No CKD: Lab Results  Component Value Date   BUN 6 03/29/2024   Lab Results  Component Value Date   CREATININE 0.69 03/29/2024   No MAU: Lab Results  Component Value Date   MICRALBCREAT 4 03/10/2024   MICRALBCREAT 28 05/09/2020   MICRALBCREAT 25.1 11/20/2009   + HL: Lab Results  Component Value Date   CHOL 171 11/20/2023   HDL 77.70 11/20/2023   LDLCALC 74 11/20/2023   TRIG 97.0 11/20/2023   CHOLHDL 2 11/20/2023  On Crestor  5 mg 2x a week.  - Last eye exam 2025:  reportedly No DR, IOP high - [facility].  - Resolved numbness and tingling in hand and feet.  She felt much better on Cymbalta  and Neurontin , but she is now off both -no return of symptoms.  Last foot exam 11/04/2023.  She had CP >> had a stress test 07/04/2015 >>  Normal.  She is on metoprolol  for tachycardia. She has fibromyalgia.   She also has a history of iron  deficiency anemia and had iron  infusions.   In 2019, she just got out of a 5-year relationship.  She was under a lot of stress.   Review of Systems + see HPI  Past Medical History:  Diagnosis Date   ADHD (attention deficit hyperactivity disorder)    ALLERGIC RHINITIS    Allergy    Anemia    ANXIETY    Arthritis    left knee   Complication of anesthesia    2017 propofol  for EGD  chest tightness, SOB. 2024 surgery had nystagmus after surgery   Depression    DIABETES MELLITUS, TYPE II    Dysrhythmia    Sinus Tachycardia - On Metoprolol    Fibromyalgia 2018   GERD (gastroesophageal reflux disease)    History of hiatal hernia    small hernia found on scan   History of kidney stones    MIGRAINE, COMMON    Obesity, unspecified    OVARIAN CYST    Past Surgical History:  Procedure Laterality Date   HIP ARTHROSCOPY W/ LABRAL REPAIR Right 07/2023   KNEE ARTHROSCOPY Left 08/25/2022   TOTAL KNEE ARTHROPLASTY Left 03/31/2024   Procedure: ARTHROPLASTY, KNEE, TOTAL;  Surgeon: Lisa Cordella Hamilton, MD;  Location: [facility] OR;  Service: Orthopedics;  Laterality: Left;   Social History   Socioeconomic History   Marital status: Married    Spouse name: Not on file   Number of children: Not on file   Years of education: Not on file   Highest education level: Bachelor's degree (e.g., BA, AB, BS)  Occupational History   Not on file  Tobacco Use   Smoking status: Former    Current packs/day: 0.00    Types: Cigarettes    Quit date: 08/20/2002    Years since quitting: 21.9   Smokeless tobacco: Never   Tobacco comments:    single, separated from spouse 02/2010.   Vaping Use   Vaping status: Never Used  Substance and Sexual Activity   Alcohol use: Yes    Comment: maybe one drink per month   Drug use: No   Sexual activity: Not Currently    Birth control/protection: I.U.D.  Other Topics Concern   Not on file  Social History Narrative   Single, separated from spouse 02/2010   Social Drivers of Health   Tobacco Use: Medium Risk (07/21/2024)   Patient History    Smoking Tobacco Use: Former    Smokeless Tobacco Use: Never    Passive Exposure: Not on file  Financial Resource Strain: Low Risk (05/30/2024)   Overall Financial Resource Strain (CARDIA)    Difficulty of Paying Living Expenses: Not very hard  Food Insecurity: No Food Insecurity (05/30/2024)   Epic    Worried  About Programme Researcher, Broadcasting/film/video in the Last Year: Never true    Ran Out of Food in the Last Year: Never true  Transportation Needs: No Transportation Needs (05/30/2024)   Epic    Lack of Transportation (Medical): No    Lack of Transportation (Non-Medical): No  Physical Activity: Unknown (05/30/2024)   Exercise  Vital Sign    Days of Exercise per Week: Patient declined    Minutes of Exercise per Session: Not on file  Stress: Stress Concern Present (05/30/2024)   [employer] of Occupational Health - Occupational Stress Questionnaire    Feeling of Stress: Rather much  Social Connections: Socially Integrated (05/30/2024)   Social Connection and Isolation Panel    Frequency of Communication with Friends and Family: Twice a week    Frequency of Social Gatherings with Friends and Family: Once a week    Attends Religious Services: More than 4 times per year    Active Member of [employer] or Organizations: Yes    Attends Banker Meetings: More than 4 times per year    Marital Status: Married  Catering Manager Violence: Not on file  Depression (PHQ2-9): Low Risk (11/20/2023)   Depression (PHQ2-9)    PHQ-2 Score: 0  Alcohol Screen: Low Risk (05/30/2024)   Alcohol Screen    Last Alcohol Screening Score (AUDIT): 1  Housing: Low Risk (05/30/2024)   Epic    Unable to Pay for Housing in the Last Year: No    Number of Times Moved in the Last Year: 0    Homeless in the Last Year: No  Utilities: Not on file  Health Literacy: Not on file   Current Outpatient Medications on File Prior to Visit  Medication Sig Dispense Refill   ALPRAZolam  (XANAX ) 0.5 MG tablet TAKE 1 TABLET(0.5 MG) BY MOUTH THREE TIMES DAILY AS NEEDED FOR ANXIETY 60 tablet 5   amoxicillin  (AMOXIL ) 500 MG tablet Take 2g (#4 500mg  tablets) one hour prior to dental procedure. 12 tablet 0   amphetamine -dextroamphetamine  (ADDERALL) 20 MG tablet Take 1 tablet (20 mg total) by mouth 2 (two) times daily. 180 tablet 0    cyanocobalamin  (VITAMIN B12) 1000 MCG/ML injection INJECT INTRAMUSCULARLY 1 ML  EVERY 30 DAYS (DISCARD 28 DAYS  AFTER FIRST USE) 3 mL 2   cyclobenzaprine  (FLEXERIL ) 5 MG tablet Take 1 tablet (5 mg total) by mouth at bedtime as needed for muscle spasms. 30 tablet 3   DULoxetine  (CYMBALTA ) 20 MG capsule Take 1 capsule (20 mg total) by mouth daily. 90 capsule 1   EPINEPHrine  0.3 mg/0.3 mL IJ SOAJ injection Use as needed for hypersensitivity reaction 1 each 0   fexofenadine (ALLEGRA) 60 MG tablet Take 60 mg by mouth daily as needed (Allergies).     glucose blood (ONETOUCH VERIO) test strip Use 2x a day 200 each 3   levonorgestrel  (MIRENA ) 20 MCG/24HR IUD 1 Intra Uterine Device (1 each total) by Intrauterine route once. 1 each 0   meloxicam  (MOBIC ) 15 MG tablet Take 1 tablet daily for 2 weeks.  If still in pain after 2 weeks, take 1 tablet daily for an additional 1 week. 30 tablet 0   metFORMIN  (GLUCOPHAGE ) 1000 MG tablet TAKE 2 TABLETS BY MOUTH DAILY WITH SUPPER. 180 tablet 3   metoprolol  succinate (TOPROL -XL) 25 MG 24 hr tablet TAKE 1 TABLET BY MOUTH DAILY 90 tablet 3   omeprazole  (PRILOSEC) 40 MG capsule TAKE 1 CAPSULE BY MOUTH DAILY BEFORE SUPPER 90 capsule 2   ondansetron  (ZOFRAN -ODT) 4 MG disintegrating tablet Take 1 tablet (4 mg total) by mouth every 8 (eight) hours as needed for nausea or vomiting. 20 tablet 0   promethazine  (PHENERGAN ) 25 MG tablet Take 1 tablet (25 mg total) by mouth every 6 (six) hours as needed for nausea. 15 tablet 0   rizatriptan  (MAXALT ) 10 MG tablet  TAKE 1 TABLET BY MOUTH AS NEEDED FOR MIGRAINE. MAY REPEAT IN 2 HOURS IF NEEDED 10 tablet 8   rosuvastatin  (CRESTOR ) 5 MG tablet TAKE 1 TABLET(5 MG) BY MOUTH 2 TIMES A WEEK 13 tablet 3   Semaglutide , 1 MG/DOSE, (OZEMPIC , 1 MG/DOSE,) 4 MG/3ML SOPN Inject 1 mg into the skin once a week. 9 mL 3   SYRINGE-NEEDLE, DISP, 3 ML 25G X 1 3 ML MISC Use for monthly B12 injections 12 each 1   triamcinolone  cream (KENALOG ) 0.1 % Apply 1  application topically 2 (two) times daily as needed (for itching). 28.4 g 1   No current facility-administered medications on file prior to visit.   Allergies  Allergen Reactions   Chicken Allergy Anaphylaxis and Shortness Of Breath   Covid-19 Mrna Vacc (Moderna) Anaphylaxis   Other Itching, Swelling and Other (See Comments)    NUTS (of ANY kind): Lips and mouth swell, but no breathing impairment & migraines and eczema are triggered   Propofol  Shortness Of Breath    SOB, chest tightness, wheezing after EGD on 2017  Received propofol  for surgery 07/21/23 without complication   Basaglar  Kwikpen [Insulin  Glargine] Palpitations and Hypertension   Adhesive [Tape] Other (See Comments)    Makes the skin VERY RED    Gluten Meal Diarrhea   Peanut-Containing Drug Products Itching, Swelling and Other (See Comments)    Lips and mouth swell, but no breathing impairment & migraines and eczema are triggered    Novolog  [Insulin  Aspart (Human Analog) (Yeast)] Rash   Tresiba  [Insulin  Degludec] Rash    eczema   Victoza  [Liraglutide ] Rash   Family History  Problem Relation Age of Onset   Diabetes Mother    Hyperlipidemia Mother    CAD Mother    CVA Mother    Diabetes Father    Hyperlipidemia Father    Coronary artery disease Father    Hypertension Father    Cancer Father        esoph   Stroke Father    Cancer Maternal Grandfather 50       Prostate and Lung   Diabetes Sister        Gestational   Graves' disease Maternal Aunt    Cancer Maternal Uncle        Brain   Stroke Paternal Grandmother    Heart disease Paternal Grandmother    Stroke Paternal Grandfather    Heart disease Paternal Grandfather    Diabetes Sister        Gestational and Type II   Hashimoto's thyroiditis Cousin    Cancer Maternal Uncle        Brain   Objective:   Physical Exam There were no vitals taken for this visit.  Wt Readings from Last 15 Encounters:  07/05/24 177 lb (80.3 kg)  05/31/24 179 lb (81.2 kg)   03/31/24 189 lb (85.7 kg)  03/29/24 189 lb (85.7 kg)  03/10/24 190 lb 3.2 oz (86.3 kg)  02/11/24 191 lb (86.6 kg)  12/10/23 190 lb 3.2 oz (86.3 kg)  11/20/23 186 lb (84.4 kg)  11/17/23 187 lb (84.8 kg)  11/06/23 187 lb (84.8 kg)  11/04/23 187 lb 12.8 oz (85.2 kg)  07/21/23 178 lb (80.7 kg)  07/16/23 179 lb 3.2 oz (81.3 kg)  05/22/23 178 lb (80.7 kg)  05/21/23 179 lb (81.2 kg)   Constitutional: overweight, in NAD Eyes: EOMI, no exophthalmos ENT: no thyromegaly, no cervical lymphadenopathy Cardiovascular: tachycardia, RR, No MRG Respiratory: CTA B Musculoskeletal: no deformities  Skin: no rashes Neurological: no tremor with outstretched hands  Assessment:     1. DM2, now non insulin -dependent, uncontrolled, without complications  Component     Latest Ref Rng 08/17/2012  Glutamic Acid Decarb Ab     <=1.0 U/mL <1.0  Pancreatic Islet Cell Antibody     < 5 JDF Units <5  C-Peptide     0.80 - 3.90 ng/mL 1.84  Glucose     70 - 99 mg/dL 895 (H)     Component     Latest Ref Rng 10/31/2022  Glucose     65 - 99 mg/dL 84   C-Peptide     9.19 - 3.85 ng/mL 1.47    2. Obesity class 1  3. HL  Plan:     1. Patient with longstanding, previously uncontrolled type 2 diabetes, on insulin  in the past but now off due to improved control.  She is on metformin  and weekly GLP-1 receptor agonist, dose increased at last visit.  HbA1c at last visit was higher, at 7.1%, likely due to not being very active due to knee pain and also steroid injections.  She had a left knee replacement 4 months ago.  Before this, she had another HbA1c which was better, at 6.7%  - I suggested to:  Patient Instructions  Please continue: - Metformin  1000 mg 2x a day with meals - Ozempic  1 mg weekly  Please return in 4-6 months.  - we checked her HbA1c: 7%  - advised to check sugars at different times of the day - 1x a day, rotating check times - advised for yearly eye exams >> she is UTD - return to clinic in  4-6 months  2. Obesity class 1 -Ozempic  was initially poorly tolerated but she then started to tolerate it better and now has no consistent side effects from it.  At last visit, we discussed about increasing the dose. - She lost approximately 70 pounds after starting Ozempic  but then gained gained 9 pounds afterwards due to inactivity related to her surgeries. - she appears to have gained 3 pounds before last visit, but this may be related to her knee  brace - However, since last visit, she lost 13 pounds  3. HL - Latest lipid panel was at goal: Lab Results  Component Value Date   CHOL 171 11/20/2023   HDL 77.70 11/20/2023   LDLCALC 74 11/20/2023   TRIG 97.0 11/20/2023   CHOLHDL 2 11/20/2023  - She continues on Crestor  5 mg twice a week.  She has muscle and joint aches but she does not feel that these are related to the statin, but to her fibromyalgia  Lela Fendt, MD PhD [facility] Endocr[facility]

## 2024-08-01 ENCOUNTER — Ambulatory Visit (INDEPENDENT_AMBULATORY_CARE_PROVIDER_SITE_OTHER): Admitting: Physical Therapy

## 2024-08-01 ENCOUNTER — Other Ambulatory Visit: Payer: Self-pay

## 2024-08-01 ENCOUNTER — Encounter: Payer: Self-pay | Admitting: Physical Therapy

## 2024-08-01 DIAGNOSIS — M25551 Pain in right hip: Secondary | ICD-10-CM

## 2024-08-01 DIAGNOSIS — M5459 Other low back pain: Secondary | ICD-10-CM | POA: Diagnosis not present

## 2024-08-01 DIAGNOSIS — M6281 Muscle weakness (generalized): Secondary | ICD-10-CM | POA: Diagnosis not present

## 2024-08-01 NOTE — Therapy (Signed)
 " OUTPATIENT PHYSICAL THERAPY TREATMENT   Patient Name: Lisa Duran MRN: [id number] DOB:Sep 13, 1980, 43 y.o., female Today's Date: 08/01/2024   END OF SESSION:  PT End of Session - 08/01/24 0945     Visit Number 4    Number of Visits 17    Date for Recertification  09/09/24    Authorization Type UHC    PT Start Time [phone]    PT Stop Time 1012    PT Time Calculation (min) 41 min    Activity Tolerance Patient tolerated treatment well    Behavior During Therapy [facility] for tasks assessed/performed             Past Medical History:  Diagnosis Date   ADHD (attention deficit hyperactivity disorder)    ALLERGIC RHINITIS    Allergy    Anemia    ANXIETY    Arthritis    left knee   Complication of anesthesia    2017 propofol  for EGD chest tightness, SOB. 2024 surgery had nystagmus after surgery   Depression    DIABETES MELLITUS, TYPE II    Dysrhythmia    Sinus Tachycardia - On Metoprolol    Fibromyalgia 2018   GERD (gastroesophageal reflux disease)    History of hiatal hernia    small hernia found on scan   History of kidney stones    MIGRAINE, COMMON    Obesity, unspecified    OVARIAN CYST    Past Surgical History:  Procedure Laterality Date   HIP ARTHROSCOPY W/ LABRAL REPAIR Right 07/2023   KNEE ARTHROSCOPY Left 08/25/2022   TOTAL KNEE ARTHROPLASTY Left 03/31/2024   Procedure: ARTHROPLASTY, KNEE, TOTAL;  Surgeon: Addie Cordella Hamilton, MD;  Location: [facility] OR;  Service: Orthopedics;  Laterality: Left;   Patient Active Problem List   Diagnosis Date Noted   Diabetes mellitus treated with injections of non-insulin  medication (HCC) 05/31/2024   Diabetes mellitus treated with oral medication (HCC) 05/31/2024   Status post left knee replacement 03/31/2024   Tear of right acetabular labrum 07/21/2023   B12 deficiency 05/09/2021   Hyperlipidemia associated with type 2 diabetes mellitus (HCC) 04/14/2019   Enteritis 01/12/2019   Type 2 diabetes mellitus with  hyperlipidemia (HCC) 01/11/2019   Sleep walking disorder 04/06/2017   Iron  deficiency anemia 03/27/2017   Fibromyalgia 02/03/2017   GERD (gastroesophageal reflux disease) 06/24/2015   Eczema 06/22/2015   Depression 06/22/2015   Tachycardia 06/16/2014   Food allergy    ADHD (attention deficit hyperactivity disorder) 01/27/2011   Anxiety 02/20/2010   Allergic rhinitis 11/21/2009   OVARIAN CYST 11/21/2009   Diabetes mellitus type 2 with neurological manifestations (HCC) 11/20/2009   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 The back also seems to he spasming more when she is sitting. She also reports both hips are giving her a little more trouble today. She states the exercises don't specifically aggravate her  symptoms. She denies any specific activities that seem to aggravate her pain.   Eval: Patient reports right labrum repair last December, and has still been experiencing right hip and lower back pain that seems to extend to her right big toe and foot, muscle spasms, and extending up to her right shoulder blade, a numb and tingling feeling. The right big toe and foot spasms occur daily and mostly at night. The right hip and back pain feels different than the labral pain she was having before. She was doing a lot of lifting last month so that may have aggravated the back more. The left knee is doing well,  she did start trying to do kneeling at home. Currently she has difficulty and pain with sitting and driving, walking extended distances can aggravate the hip, pretty much anything more than lower level activity can aggravate right hip and back.  PERTINENT HISTORY:  Right hip arthroscopy with labral repair - 07/21/2023 Left TKA - 03/31/2024  PAIN:  Are you having pain? Yes:  NPRS scale: 2-3/10 at rest, 5-6/10 at worst Pain location: Right hip and back region Pain description: Right hip - ache, lower back - tight, burn, numb, tingling Aggravating factors: Sitting/driving, walking, bending, lying on right side Relieving factors: Medication  PRECAUTIONS: None  PATIENT GOALS: Pain relief, improve sitting comfort, get back to hiking   OBJECTIVE:  Note: Objective measures were completed at Evaluation unless otherwise noted. PATIENT SURVEYS:  PSFS: 3.67 Driving: 5 Exercises / strength training: 2 Hiking: 4  MUSCLE LENGTH: Right hamstring limited with pinching noted anterior hip/groin region, with addition of ankle DF reports pain down back of right leg to foot  POSTURE:   Slight rounded shoulder posture  PALPATION: Exquisite tenderness noted right glute med and max region, tenderness over greater trochanter and TFL region, tenderness right lumbar paraspinal and QL region, tenderness right lower periscapular region  LUMBAR ROM:   AROM eval  Flexion WFL  Extension 50% Bilateral lower back pain  Right lateral flexion WFL Right lower back pain  Left lateral flexion WFL Right hip pain  Right rotation 50% Right subscapular pain  Left rotation 50%  Bilateral thoracic tightness   (Blank rows = not tested)  LOWER EXTREMITY ROM:    Right hip flexion and IR limited with report of pain/pinching anterior hip and groin region  LOWER EXTREMITY MMT:    MMT Right eval Left eval  Hip flexion 4- 4  Hip extension 4- 4  Hip abduction 3+ 4-  Hip adduction    Hip internal rotation     Hip external rotation    Knee flexion 5 5  Knee extension 5 5  Ankle dorsiflexion    Ankle plantarflexion    Ankle inversion    Ankle eversion     (Blank rows = not tested)  FUNCTIONAL TESTS:  Not formally assessed at eval  GAIT: Assistive device utilized: None Level of assistance: Complete Independence Comments: Antalgic on right   TREATMENT  [facility] Adult PT Treatment:                                                DATE: 08/01/2024 Hooklying sciatic nerve floss 2 x 10 LTR x 8 each Sidelying thoracic rotation x 10 each Cat cow x 5 Quadruped PPT with rock back x 5 Quadruped thoracic rotation with hand behind head  x 5 each Hooklying PPT x 5, add march x 5 each SLR with focus on PPT 2 x 5 each Hooklying PPT hip abduction isometric with black 2 x 5 x 5 sec Seated figure-4 position ankle inversion with yellow 2 x 10 right Seated double ER and scap retraction with yellow 2 x 10  PATIENT EDUCATION:  Education details: HEP update Person educated: Patient Education method: Explanation, Demonstration, Tactile cues, Verbal cues, Handout Education comprehension: verbalized understanding, returned demonstration, verbal cues required, tactile cues required, and needs further education  HOME EXERCISE PROGRAM: Access Code: [email or web address]   ASSESSMENT: CLINICAL IMPRESSION: Patient tolerated therapy well with no adverse effects. She arrives reporting increased right foot/lower leg and back symptoms that could be due to recent illness. Therapy focused on improving spinal mobility and core, hip, and periscapular strengthening with good tolerance. Also incorporated right ankle strengthening for the posterior tib as she described her symptoms being along the medial and deep aspect of the shin and ankle. She did report more lower back achiness more with lumbar extension movements. She does seem to tolerate hip exercises better when in a posterior tilted position at the pelvis. Updated her HEP to  progress spinal mobility and ankle strengthening exercises. Patient would benefit from continued skilled PT to progress mobility and strength in order to reduce pain and maximize functional ability.   Eval: Patient is a 43 y.o. female who was seen today for physical therapy evaluation and treatment for chronic right hip and lower/mid back pain, as well as right foot/great toe spasms. Her symptoms seem multifactorial, but primarily related to right glute med and hip intraarticular pain. She does have significant strength deficits of the right hip and also with limited hip motion due to pinching pain at the anterior hip and groin region. She does exhibit limitations in her spinal motion with tenderness of the right sided trunk musculature, and right sciatic neurodynamic deficits. She reports pain is impacting her functional ability that limit tasks such as sitting, walking, hiking, bending.  OBJECTIVE IMPAIRMENTS: Abnormal gait, decreased activity tolerance, decreased ROM, decreased strength, impaired flexibility, postural dysfunction, and pain.   ACTIVITY LIMITATIONS: lifting, sitting, standing, squatting, sleeping, stairs, locomotion level, and caring for others  PARTICIPATION LIMITATIONS: meal prep, cleaning, laundry, driving, shopping, community activity, and occupation  PERSONAL FACTORS: Fitness, Past/current experiences, and Time since onset of injury/illness/exacerbation are also affecting patient's functional outcome.    GOALS: Goals reviewed with patient? Yes  SHORT TERM GOALS: Target date: 08/12/2024  Patient will be I with initial HEP in order to progress with therapy. Baseline: HEP provided at eval Goal status: INITIAL  2.  Patient will report right hip/lower back pain with activity </= 3/10 in order to reduce functional limitations Baseline: 5-6/10 Goal status: INITIAL  LONG TERM GOALS: Target date: 09/09/2024  Patient will be I with final HEP to maintain progress from  PT. Baseline: HEP provided at eval Goal status: INITIAL  2.  Patient will report PSFS >/= 7 in order to indicate improvement in their functional ability. Baseline: 3.67 Goal status: INITIAL  3.  Patient will demonstrate hip strength >/= 4/5 MMT in order to improve her activity tolerance and reduce pain with walking/hiking Baseline: see limitations above Goal status: INITIAL  4.  Patient will demonstrate hip PROM and lumbar AROM without increase in pain to improve her functional mobility and reduce pain with activity and sitting Baseline: see limitations above Goal status: INITIAL   PLAN: PT FREQUENCY: 2x/week  PT DURATION: 8  weeks  PLANNED INTERVENTIONS: 97164- PT Re-evaluation, 97750- Physical Performance Testing, 97110-Therapeutic exercises, 97530- Therapeutic activity, 97112- Neuromuscular re-education, 97535- Self Care, 02859- Manual therapy, [phone]- Gait training, [phone] (1-2 muscles), [phone] (3+ muscles)- Dry Needling, Patient/Family education, Balance training, Stair training, Taping, Joint mobilization, Joint manipulation, Spinal manipulation, Spinal mobilization, Cryotherapy, and Moist heat.  PLAN FOR NEXT SESSION: Review HEP and progress PRN, manual/TPDN for right gluteal and lumbar region, mobs/LAD for right hip and improve lumbar and thoracic mobility, progress core stabilization and hip strengthening as tolerated   Elaine Daring, PT, DPT, LAT, ATC 08/01/2024  11:06 AM Phone: [phone] Fax: [phone]   "

## 2024-08-01 NOTE — Patient Instructions (Signed)
 Access Code: [email or web address] URL: [email or web address] Date: 08/01/2024 Prepared by: Elaine Daring  Exercises - Supine Sciatic Nerve Glide  - 1 x daily - 2 sets - 10 reps - Supine March with Posterior Pelvic Tilt  - 1 x daily - 2 sets - 10 reps - Bridge with Resistance  - 1 x daily - 3 sets - 5 reps - 5 seconds hold - Hooklying Clamshell with Resistance  - 1 x daily - 3 sets - 5 reps - 5 seconds hold - Sidelying Thoracic Lumbar Rotation  - 1 x daily - 10 reps - Cat Cow  - 1 x daily - 3 sets - 5 reps - Seated Passive Cervical Retraction  - 1 x daily - 2 sets - 10 reps - 5 seconds hold - Shoulder External Rotation and Scapular Retraction with Resistance  - 1 x daily - 2 sets - 10 reps - Seated Figure 4 Ankle Inversion with Resistance  - 1 x daily - 2 sets - 10 reps

## 2024-08-08 ENCOUNTER — Ambulatory Visit (INDEPENDENT_AMBULATORY_CARE_PROVIDER_SITE_OTHER): Admitting: Physical Therapy

## 2024-08-08 ENCOUNTER — Encounter: Payer: Self-pay | Admitting: Physical Therapy

## 2024-08-08 ENCOUNTER — Other Ambulatory Visit: Payer: Self-pay

## 2024-08-08 DIAGNOSIS — M6281 Muscle weakness (generalized): Secondary | ICD-10-CM | POA: Diagnosis not present

## 2024-08-08 DIAGNOSIS — M25551 Pain in right hip: Secondary | ICD-10-CM | POA: Diagnosis not present

## 2024-08-08 DIAGNOSIS — M5459 Other low back pain: Secondary | ICD-10-CM | POA: Diagnosis not present

## 2024-08-08 NOTE — Patient Instructions (Signed)
 Access Code: [email or web address] URL: [email or web address] Date: 08/08/2024 Prepared by: Elaine Daring  Exercises - Supine Sciatic Nerve Glide  - 1 x daily - 2 sets - 10 reps - Supine March with Posterior Pelvic Tilt  - 1 x daily - 2 sets - 10 reps - Bridge with Resistance  - 1 x daily - 3 sets - 5 reps - 5 seconds hold - Hooklying Clamshell with Resistance  - 1 x daily - 3 sets - 5 reps - 5 seconds hold - Clam  - 1 x daily - 2 sets - 10 reps - Sidelying Thoracic Lumbar Rotation  - 1 x daily - 10 reps - Cat Cow  - 1 x daily - 3 sets - 5 reps - Seated Passive Cervical Retraction  - 1 x daily - 2 sets - 10 reps - 5 seconds hold - Shoulder External Rotation and Scapular Retraction with Resistance  - 1 x daily - 2 sets - 10 reps - Seated Figure 4 Ankle Inversion with Resistance  - 1 x daily - 2 sets - 10 reps

## 2024-08-08 NOTE — Therapy (Signed)
 " OUTPATIENT PHYSICAL THERAPY TREATMENT   Patient Name: Lisa Duran MRN: [id number] DOB:Jun 01, 1981, 43 y.o., female Today's Date: 08/08/2024   END OF SESSION:  PT End of Session - 08/08/24 0904     Visit Number 5    Number of Visits 17    Date for Recertification  09/09/24    Authorization Type UHC    PT Start Time [id number]    PT Stop Time 0931    PT Time Calculation (min) 38 min    Activity Tolerance Patient tolerated treatment well    Behavior During Therapy [facility] for tasks assessed/performed              Past Medical History:  Diagnosis Date   ADHD (attention deficit hyperactivity disorder)    ALLERGIC RHINITIS    Allergy    Anemia    ANXIETY    Arthritis    left knee   Complication of anesthesia    2017 propofol  for EGD chest tightness, SOB. 2024 surgery had nystagmus after surgery   Depression    DIABETES MELLITUS, TYPE II    Dysrhythmia    Sinus Tachycardia - On Metoprolol    Fibromyalgia 2018   GERD (gastroesophageal reflux disease)    History of hiatal hernia    small hernia found on scan   History of kidney stones    MIGRAINE, COMMON    Obesity, unspecified    OVARIAN CYST    Past Surgical History:  Procedure Laterality Date   HIP ARTHROSCOPY W/ LABRAL REPAIR Right 07/2023   KNEE ARTHROSCOPY Left 08/25/2022   TOTAL KNEE ARTHROPLASTY Left 03/31/2024   Procedure: ARTHROPLASTY, KNEE, TOTAL;  Surgeon: Addie Cordella Hamilton, MD;  Location: [facility] OR;  Service: Orthopedics;  Laterality: Left;   Patient Active Problem List   Diagnosis Date Noted   Diabetes mellitus treated with injections of non-insulin  medication (HCC) 05/31/2024   Diabetes mellitus treated with oral medication (HCC) 05/31/2024   Status post left knee replacement 03/31/2024   Tear of right acetabular labrum 07/21/2023   B12 deficiency 05/09/2021   Hyperlipidemia associated with type 2 diabetes mellitus (HCC) 04/14/2019   Enteritis 01/12/2019   Type 2 diabetes mellitus with  hyperlipidemia (HCC) 01/11/2019   Sleep walking disorder 04/06/2017   Iron  deficiency anemia 03/27/2017   Fibromyalgia 02/03/2017   GERD (gastroesophageal reflux disease) 06/24/2015   Eczema 06/22/2015   Depression 06/22/2015   Tachycardia 06/16/2014   Food allergy    ADHD (attention deficit hyperactivity disorder) 01/27/2011   Anxiety 02/20/2010   Allergic rhinitis 11/21/2009   OVARIAN CYST 11/21/2009   Diabetes mellitus type 2 with neurological manifestations (HCC) 11/20/2009   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 She feels the tremors of her foot mainly when moving the foot inward. She did have drive about 4 hours and her back did not bother her as bad, it was still uncomfortable but not as bad.  Eval: Patient reports right labrum repair last December, and has  still been experiencing right hip and lower back pain that seems to extend to her right big toe and foot, muscle spasms, and extending up to her right shoulder blade, a numb and tingling feeling. The right big toe and foot spasms occur daily and mostly at night. The right hip and back pain feels different than the labral pain she was having before. She was doing a lot of lifting last month so that may have aggravated the back more. The left knee is doing well, she did start trying to do kneeling at home. Currently she has difficulty and pain with sitting and driving, walking extended distances can aggravate the hip,  pretty much anything more than lower level activity can aggravate right hip and back.  PERTINENT HISTORY:  Right hip arthroscopy with labral repair - 07/21/2023 Left TKA - 03/31/2024  PAIN:  Are you having pain? Yes:  NPRS scale: 2/10 at rest, 5-6/10 at worst Pain location: Right hip and back region Pain description: Right hip - ache, lower back - tight, burn, numb, tingling Aggravating factors: Sitting/driving, walking, bending, lying on right side Relieving factors: Medication  PRECAUTIONS: None  PATIENT GOALS: Pain relief, improve sitting comfort, get back to hiking   OBJECTIVE:  Note: Objective measures were completed at Evaluation unless otherwise noted. PATIENT SURVEYS:  PSFS: 3.67 Driving: 5 Exercises / strength training: 2 Hiking: 4  MUSCLE LENGTH: Right hamstring limited with pinching noted anterior hip/groin region, with addition of ankle DF reports pain down back of right leg to foot  POSTURE:   Slight rounded shoulder posture  PALPATION: Exquisite tenderness noted right glute med and max region, tenderness over greater trochanter and TFL region, tenderness right lumbar paraspinal and QL region, tenderness right lower periscapular region  LUMBAR ROM:   AROM eval  Flexion WFL  Extension 50% Bilateral lower back pain  Right lateral flexion WFL Right lower back pain  Left lateral flexion WFL Right hip pain  Right rotation 50% Right subscapular pain  Left rotation 50%  Bilateral thoracic tightness   (Blank rows = not tested)  LOWER EXTREMITY ROM:    Right hip flexion and IR limited with report of pain/pinching anterior hip and groin region  LOWER EXTREMITY MMT:    MMT Right eval Left eval Rt / Lt 08/08/2024  Hip flexion 4- 4   Hip extension 4- 4   Hip abduction 3+ 4-   Hip adduction     Hip internal rotation     Hip external rotation     Knee flexion 5 5   Knee extension 5 5   Ankle dorsiflexion   5 / 5  Ankle plantarflexion      Ankle  inversion   4 / 5  Ankle eversion   5 / 5   (Blank rows = not tested)  FUNCTIONAL TESTS:  Not formally assessed at eval  GAIT: Assistive device utilized: None Level of assistance: Complete Independence Comments: Antalgic on right   TREATMENT  [facility] Adult PT Treatment:                                                DATE: 08/08/2024 Hooklying PPT hip abduction isometric with black 10 x 5 sec Bridge with black at knees 2 x 5 x 5 sec SLR with focus on PPT x 10 each Side clamshell 2 x 10 right  Cat cow x 5 Quadruped alternating leg extension x 5 each Quadruped thoracic rotation with hand behind head x 5 each Seated double ER and scap retraction with yellow x 10 Row with blue 2 x 10 Longsitting ankle inversion with yellow x 10  PATIENT EDUCATION:  Education details: HEP update Person educated: Patient Education method: Explanation, Demonstration, Tactile cues, Verbal cues, Handout Education comprehension: verbalized understanding, returned demonstration, verbal cues required, tactile cues required, and needs further education  HOME EXERCISE PROGRAM: Access Code: [email or web address]   ASSESSMENT: CLINICAL IMPRESSION: Patient tolerated therapy well with no adverse effects. Therapy continues to focus on progressing his hip strengthening and lumbopelvic and postural control with good tolerance. She was able to progress with her glute med strengthening this visit. She does continue to report a tremor of the right foot/ankle primarily when actively moving her right ankle into inversion. She denies any pain with this movement. Updated HEP to progress hip strengthening for home. Patient would benefit from continued skilled PT to progress mobility and strength in order to reduce pain and maximize functional ability.   Eval: Patient is a 43 y.o. female who was seen today for physical therapy evaluation and treatment for chronic right hip and lower/mid back pain, as well as right foot/great toe spasms.  Her symptoms seem multifactorial, but primarily related to right glute med and hip intraarticular pain. She does have significant strength deficits of the right hip and also with limited hip motion due to pinching pain at the anterior hip and groin region. She does exhibit limitations in her spinal motion with tenderness of the right sided trunk musculature, and right sciatic neurodynamic deficits. She reports pain is impacting her functional ability that limit tasks such as sitting, walking, hiking, bending.  OBJECTIVE IMPAIRMENTS: Abnormal gait, decreased activity tolerance, decreased ROM, decreased strength, impaired flexibility, postural dysfunction, and pain.   ACTIVITY LIMITATIONS: lifting, sitting, standing, squatting, sleeping, stairs, locomotion level, and caring for others  PARTICIPATION LIMITATIONS: meal prep, cleaning, laundry, driving, shopping, community activity, and occupation  PERSONAL FACTORS: Fitness, Past/current experiences, and Time since onset of injury/illness/exacerbation are also affecting patient's functional outcome.    GOALS: Goals reviewed with patient? Yes  SHORT TERM GOALS: Target date: 08/12/2024  Patient will be I with initial HEP in order to progress with therapy. Baseline: HEP provided at eval Goal status: INITIAL  2.  Patient will report right hip/lower back pain with activity </= 3/10 in order to reduce functional limitations Baseline: 5-6/10 Goal status: INITIAL  LONG TERM GOALS: Target date: 09/09/2024  Patient will be I with final HEP to maintain progress from PT. Baseline: HEP provided at eval Goal status: INITIAL  2.  Patient will report PSFS >/= 7 in order to indicate improvement in their functional ability. Baseline: 3.67 Goal status: INITIAL  3.  Patient will demonstrate hip strength >/= 4/5 MMT in order to improve her activity tolerance and reduce pain with walking/hiking Baseline: see limitations above Goal status: INITIAL  4.   Patient will demonstrate hip PROM and lumbar AROM without increase in pain to improve her functional mobility and reduce pain with activity and sitting Baseline: see limitations above Goal status: INITIAL   PLAN: PT FREQUENCY: 2x/week  PT DURATION: 8 weeks  PLANNED INTERVENTIONS: 97164- PT Re-evaluation, 97750- Physical Performance Testing, 97110-Therapeutic exercises, 97530- Therapeutic activity, 97112- Neuromuscular re-education, 97535- Self Care, 02859- Manual therapy, [id number]- Gait training, [phone] (1-2 muscles), 20561 (3+ muscles)- Dry Needling, Patient/Family education, Balance training, Stair training, Taping, Joint mobilization, Joint  manipulation, Spinal manipulation, Spinal mobilization, Cryotherapy, and Moist heat.  PLAN FOR NEXT SESSION: Review HEP and progress PRN, manual/TPDN for right gluteal and lumbar region, mobs/LAD for right hip and improve lumbar and thoracic mobility, progress core stabilization and hip strengthening as tolerated   Elaine Daring, PT, DPT, LAT, ATC 08/08/2024  9:42 AM Phone: [phone] Fax: [phone]   "

## 2024-08-10 ENCOUNTER — Ambulatory Visit (INDEPENDENT_AMBULATORY_CARE_PROVIDER_SITE_OTHER): Admitting: Physical Therapy

## 2024-08-10 ENCOUNTER — Encounter: Payer: Self-pay | Admitting: Physical Therapy

## 2024-08-10 ENCOUNTER — Other Ambulatory Visit: Payer: Self-pay

## 2024-08-10 DIAGNOSIS — M5459 Other low back pain: Secondary | ICD-10-CM | POA: Diagnosis not present

## 2024-08-10 DIAGNOSIS — M6281 Muscle weakness (generalized): Secondary | ICD-10-CM

## 2024-08-10 DIAGNOSIS — M25551 Pain in right hip: Secondary | ICD-10-CM

## 2024-08-10 NOTE — Therapy (Signed)
 " OUTPATIENT PHYSICAL THERAPY TREATMENT   Patient Name: Lisa Duran MRN: [id number] DOB:[birth year], 43 y.o., female Today's Date: 08/10/2024   END OF SESSION:  PT End of Session - 08/10/24 0909     Visit Number 6    Number of Visits 17    Date for Recertification  09/09/24    Authorization Type [employer]    PT Start Time 0900    PT Stop Time 0938    PT Time Calculation (min) 38 min    Activity Tolerance Patient tolerated treatment well    Behavior During Therapy [facility] for tasks assessed/performed               Past Medical History:  Diagnosis Date   ADHD (attention deficit hyperactivity disorder)    ALLERGIC RHINITIS    Allergy    Anemia    ANXIETY    Arthritis    left knee   Complication of anesthesia    2017 propofol  for EGD chest tightness, SOB. 2024 surgery had nystagmus after surgery   Depression    DIABETES MELLITUS, TYPE II    Dysrhythmia    Sinus Tachycardia - On Metoprolol    Fibromyalgia 2018   GERD (gastroesophageal reflux disease)    History of hiatal hernia    small hernia found on scan   History of kidney stones    MIGRAINE, COMMON    Obesity, unspecified    OVARIAN CYST    Past Surgical History:  Procedure Laterality Date   HIP ARTHROSCOPY W/ LABRAL REPAIR Right 07/2023   KNEE ARTHROSCOPY Left 08/25/2022   TOTAL KNEE ARTHROPLASTY Left 03/31/2024   Procedure: ARTHROPLASTY, KNEE, TOTAL;  Surgeon: Addie Cordella Hamilton, MD;  Location: [facility] OR;  Service: Orthopedics;  Laterality: Left;   Patient Active Problem List   Diagnosis Date Noted   Diabetes mellitus treated with injections of non-insulin  medication (HCC) 05/31/2024   Diabetes mellitus treated with oral medication (HCC) 05/31/2024   Status post left knee replacement 03/31/2024   Tear of right acetabular labrum 07/21/2023   B12 deficiency 05/09/2021   Hyperlipidemia associated with type 2 diabetes mellitus (HCC) 04/14/2019   Enteritis 01/12/2019   Type 2 diabetes mellitus with  hyperlipidemia (HCC) 01/11/2019   Sleep walking disorder 04/06/2017   Iron  deficiency anemia 03/27/2017   Fibromyalgia 02/03/2017   GERD (gastroesophageal reflux disease) 06/24/2015   Eczema 06/22/2015   Depression 06/22/2015   Tachycardia 06/16/2014   Food allergy    ADHD (attention deficit hyperactivity disorder) 01/27/2011   Anxiety 02/20/2010   Allergic rhinitis 11/21/2009   OVARIAN CYST 11/21/2009   Diabetes mellitus type 2 with neurological manifestations (HCC) 11/20/2009   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  Eval: Patient reports right labrum repair last December, and has still been experiencing right hip and lower back pain that seems  to extend to her right big toe and foot, muscle spasms, and extending up to her right shoulder blade, a numb and tingling feeling. The right big toe and foot spasms occur daily and mostly at night. The right hip and back pain feels different than the labral pain she was having before. She was doing a lot of lifting last month so that may have aggravated the back more. The left knee is doing well, she did start trying to do kneeling at home. Currently she has difficulty and pain with sitting and driving, walking extended distances can aggravate the hip, pretty much anything more than lower level activity can  aggravate right hip and back.  PERTINENT HISTORY:  Right hip arthroscopy with labral repair - 07/21/2023 Left TKA - 03/31/2024  PAIN:  Are you having pain? Yes:  NPRS scale: 2/10 at rest, 5-6/10 at worst Pain location: Right hip and back region Pain description: Right hip - ache, lower back - tight, burn, numb, tingling Aggravating factors: Sitting/driving, walking, bending, lying on right side Relieving factors: Medication  PRECAUTIONS: None  PATIENT GOALS: Pain relief, improve sitting comfort, get back to hiking   OBJECTIVE:  Note: Objective measures were completed at Evaluation unless otherwise noted. PATIENT SURVEYS:  PSFS: 3.67 Driving: 5 Exercises / strength training: 2 Hiking: 4  MUSCLE LENGTH: Right hamstring limited with pinching noted anterior hip/groin region, with addition of ankle DF reports pain down back of right leg to foot  POSTURE:   Slight rounded shoulder posture  PALPATION: Exquisite tenderness noted right glute med and max region, tenderness over greater trochanter and TFL region, tenderness right lumbar paraspinal and QL region, tenderness right lower periscapular region  LUMBAR ROM:   AROM eval 08/10/2024  Flexion WFL   Extension 50% Bilateral lower back pain 50% Bilateral lower back pain  Right lateral flexion WFL Right lower back pain   Left lateral flexion WFL Right hip pain   Right rotation 50% Right subscapular pain   Left rotation 50%  Bilateral thoracic tightness    (Blank rows = not tested)  LOWER EXTREMITY ROM:    Right hip flexion and IR limited with report of pain/pinching anterior hip and groin region  LOWER EXTREMITY MMT:    MMT Right eval Left eval Rt / Lt 08/08/2024  Hip flexion 4- 4   Hip extension 4- 4   Hip abduction 3+ 4-   Hip adduction     Hip internal rotation     Hip external rotation     Knee flexion 5 5   Knee extension 5 5   Ankle dorsiflexion   5 / 5  Ankle plantarflexion      Ankle inversion   4  / 5  Ankle eversion   5 / 5   (Blank rows = not tested)  FUNCTIONAL TESTS:  Not formally assessed at eval  GAIT: Assistive device utilized: None Level of assistance: Complete Independence Comments: Antalgic on right   TREATMENT  [facility] Adult PT Treatment:                                                DATE: 08/10/2024 Hooklying PPT 10 x 5 sec Bridge 10 x 5 sec Side clamshell 2 x 10 right Prone lower lumbar PA mobilizations Prone on elbows hold Cat cow 2 x 5 Standing lumbar extension SNAG 3  x 5 LTR x 5 each  PATIENT EDUCATION:  Education details: HEP Person educated: Patient Education method: Explanation, Demonstration, Tactile cues, Verbal cues Education comprehension: verbalized understanding, returned demonstration, verbal cues required, tactile cues required, and needs further education  HOME EXERCISE PROGRAM: Access Code: [email or web address]   ASSESSMENT: CLINICAL IMPRESSION: Patient tolerated therapy well with no adverse effects. Therapy focused on continued core and hip strengthening, and included manual therapy for the lumbar spine to improve lumbar extension tolerance and progressing lumbar extension mobility using SNAG active movements. She did report reproduction of right leg tingling while performing the lumbar extension movements. It does seem like her foot symptoms are related to the lumbar spine. No changes made to her HEP this visit. Patient would benefit from continued skilled PT to progress mobility and strength in order to reduce pain and maximize functional ability.   Eval: Patient is a 43 y.o. female who was seen today for physical therapy evaluation and treatment for chronic right hip and lower/mid back pain, as well as right foot/great toe spasms. Her symptoms seem multifactorial, but primarily related to right glute med and hip intraarticular pain. She does have significant strength deficits of the right hip and also with limited hip motion due to pinching pain at the  anterior hip and groin region. She does exhibit limitations in her spinal motion with tenderness of the right sided trunk musculature, and right sciatic neurodynamic deficits. She reports pain is impacting her functional ability that limit tasks such as sitting, walking, hiking, bending.  OBJECTIVE IMPAIRMENTS: Abnormal gait, decreased activity tolerance, decreased ROM, decreased strength, impaired flexibility, postural dysfunction, and pain.   ACTIVITY LIMITATIONS: lifting, sitting, standing, squatting, sleeping, stairs, locomotion level, and caring for others  PARTICIPATION LIMITATIONS: meal prep, cleaning, laundry, driving, shopping, community activity, and occupation  PERSONAL FACTORS: Fitness, Past/current experiences, and Time since onset of injury/illness/exacerbation are also affecting patient's functional outcome.    GOALS: Goals reviewed with patient? Yes  SHORT TERM GOALS: Target date: 08/12/2024  Patient will be I with initial HEP in order to progress with therapy. Baseline: HEP provided at eval Goal status: INITIAL  2.  Patient will report right hip/lower back pain with activity </= 3/10 in order to reduce functional limitations Baseline: 5-6/10 Goal status: INITIAL  LONG TERM GOALS: Target date: 09/09/2024  Patient will be I with final HEP to maintain progress from PT. Baseline: HEP provided at eval Goal status: INITIAL  2.  Patient will report PSFS >/= 7 in order to indicate improvement in their functional ability. Baseline: 3.67 Goal status: INITIAL  3.  Patient will demonstrate hip strength >/= 4/5 MMT in order to improve her activity tolerance and reduce pain with walking/hiking Baseline: see limitations above Goal status: INITIAL  4.  Patient will demonstrate hip PROM and lumbar AROM without increase in pain to improve her functional mobility and reduce pain with activity and sitting Baseline: see limitations above Goal status: INITIAL   PLAN: PT FREQUENCY:  2x/week  PT DURATION: 8 weeks  PLANNED INTERVENTIONS: 97164- PT Re-evaluation, 97750- Physical Performance Testing, 97110-Therapeutic exercises, 97530- Therapeutic activity, 97112- Neuromuscular re-education, 97535- Self Care, 02859- Manual therapy, 97116- Gait training, [id number] (1-2 muscles), 20561 (3+ muscles)- Dry Needling, Patient/Family education, Balance training, Stair training, Taping, Joint mobilization, Joint manipulation, Spinal manipulation, Spinal mobilization, Cryotherapy, and Moist heat.  PLAN FOR NEXT SESSION: Review HEP and progress PRN, manual/TPDN for right gluteal and lumbar region, mobs/LAD for right hip and improve lumbar and thoracic mobility, progress core stabilization  and hip strengthening as tolerated   Elaine Daring, PT, DPT, LAT, ATC 08/10/2024  9:57 AM Phone: [phone] Fax: [phone]   "

## 2024-08-15 NOTE — Progress Notes (Signed)
 "               [facility] [address] [place] [address] Phone: [phone]   Assessment and Plan:     1. Chronic right-sided low back pain with bilateral sciatica (Primary) 2. Numbness and tingling of both legs 3. Right hip pain 4. Right foot pain -Chronic with exacerbation, subsequent visit - Continued low back pain, right hip pain, right foot pain, with numbness and tingling in right lower extremity distal to knee, and new numbness and tingling in left foot.  Most consistent with lumbar radiculopathy. - Lumbar MRI 02/25/2024 only showed mild disc bulging at L2-L3 which does not perfectly fit patient's paresthesia pattern.  After discussion, patient elected to try epidural CSI to right sided L2-L3 - Offered gabapentin , however patient has had negative experience with gabapentin  in the past, so we did not start medication -Of note patient had small labral tear seen on MRI 05/14/2023 and corrected with right hip arthroscopy on 07/21/2023 -Meloxicam  course only provided mild relief.  Use meloxicam  15 mg daily as needed for breakthrough pain.  Recommend limiting chronic NSAIDs to 1-2 doses per week to prevent long-term side effects. Use Tylenol  500 to 1000 mg tablets 2-3 times a day as needed for day-to-day pain relief.     Pertinent previous records reviewed include prior lumbar and right hip MRI   Follow Up: 2 weeks after epidural to review benefit.  Could consider additional epidural if necessary.  Could discuss repeat lumbar MRI with symptoms changing   Subjective:   I, [name], am serving as a neurosurgeon for [employer]   Chief Complaint: Left knee pain    HPI:    05/21/23 Patient states left knee pain and continued right hip pain    09/14/23 Today patient states Left knee is in a lot of pain that started 2 weeks ago. Been going in PT for her hip labrel tear repair surgery and the PT thought it was a good idea to be  seen. Patient states it feels like it did when she tore her meniscus. The right knee is starting to hurt as well. Pain is sharp and shooting. Has to use crutches sometimes. Also doing RICE   11/06/2023 Patient states ready for gel injection. Would like to discuss right foot was told to ask from endoc   11/17/2023 Patient states knee pain got worse after gel. Knee feels loose, swelling, and decreased ROM    12/10/2023 Patient states that the knee is not happy today. The meloxicam  has helped some but the sharp pain is still there and waking her up at night.    02/11/2024 Patient states knees are not happy today. The brace is helping but it has progressed. Pain in hip is radiating. It is ping-ponging around. It feels like when she had the labral tear.    07/05/2024 Patient states right hip pain that radiates up the back and down the leg. Has been doing increased activity. Thoracic back pain with numbness when she does activity. Hip pain when driving. Low back is tight and painful. Right foot tingling is every night now.   08/16/2024 Patient states pain in low back. Foot tingling and spasm still present and getting worse and radiating up to her knee . Tingling and spasm is now in the left foot as well. Low back is not as flared but pain is still present    Relevant Historical Information: DM type ll  Additional pertinent review of systems negative.  Current Medications[1]   Objective:     Vitals:   08/16/24 0829  BP: 130/82  Pulse: (!) 114  SpO2: 93%  Weight: 179 lb (81.2 kg)  Height: 5' 4 (1.626 m)      Body mass index is 30.73 kg/m.    Physical Exam:    Gen: Appears well, nad, nontoxic and pleasant Psych: Alert and oriented, appropriate mood and affect Neuro: Decreased sensation over right lateral thigh compared to left.  Numb/tingling sensation over bilateral feet.  Strength is 5/5 in upper and lower extremities, muscle tone wnl Skin: no susupicious lesions or rashes   Back -  Normal skin, Spine with normal alignment and no deformity.   No tenderness to vertebral process palpation.   Bilateral thoracic and lumbar paraspinous muscles are mildly tender and without spasm NTTP gluteal musculature Straight leg raise negative Trendelenberg negative  Gait normal   Right hip: No deformity, swelling or wasting ROM Flexion 90, ext 30, IR 35, ER 45 NTTP over the hip flexors, greater trochanter, gluteal musculature, si joint, lumbar spine Negative log roll with FROM Negative FABER Positive FADIR     Electronically signed by:  [facility] 9:36 AM 08/16/2024     [1]  Current Outpatient Medications:    ALPRAZolam  (XANAX ) 0.5 MG tablet, TAKE 1 TABLET(0.5 MG) BY MOUTH THREE TIMES DAILY AS NEEDED FOR ANXIETY, Disp: 60 tablet, Rfl: 5   amoxicillin  (AMOXIL ) 500 MG tablet, Take 2g (#4 500mg  tablets) one hour prior to dental procedure., Disp: 12 tablet, Rfl: 0   amphetamine -dextroamphetamine  (ADDERALL) 20 MG tablet, Take 1 tablet (20 mg total) by mouth 2 (two) times daily., Disp: 180 tablet, Rfl: 0   cyanocobalamin  (VITAMIN B12) 1000 MCG/ML injection, INJECT INTRAMUSCULARLY 1 ML  EVERY 30 DAYS (DISCARD 28 DAYS  AFTER FIRST USE), Disp: 3 mL, Rfl: 2   cyclobenzaprine  (FLEXERIL ) 5 MG tablet, Take 1 tablet (5 mg total) by mouth at bedtime as needed for muscle spasms., Disp: 30 tablet, Rfl: 3   DULoxetine  (CYMBALTA ) 20 MG capsule, Take 1 capsule (20 mg total) by mouth daily., Disp: 90 capsule, Rfl: 1   EPINEPHrine  0.3 mg/0.3 mL IJ SOAJ injection, Use as needed for hypersensitivity reaction, Disp: 1 each, Rfl: 0   fexofenadine (ALLEGRA) 60 MG tablet, Take 60 mg by mouth daily as needed (Allergies)., Disp: , Rfl:    glucose blood (ONETOUCH VERIO) test strip, Use 2x a day, Disp: 200 each, Rfl: 3   levonorgestrel  (MIRENA ) 20 MCG/24HR IUD, 1 Intra Uterine Device (1 each total) by Intrauterine route once., Disp: 1 each, Rfl: 0   meloxicam  (MOBIC ) 15 MG  tablet, Take 1 tablet daily for 2 weeks.  If still in pain after 2 weeks, take 1 tablet daily for an additional 1 week., Disp: 30 tablet, Rfl: 0   metFORMIN  (GLUCOPHAGE ) 1000 MG tablet, TAKE 2 TABLETS BY MOUTH DAILY WITH SUPPER., Disp: 180 tablet, Rfl: 3   metoprolol  succinate (TOPROL -XL) 25 MG 24 hr tablet, TAKE 1 TABLET BY MOUTH DAILY, Disp: 90 tablet, Rfl: 3   omeprazole  (PRILOSEC) 40 MG capsule, TAKE 1 CAPSULE BY MOUTH DAILY BEFORE SUPPER, Disp: 90 capsule, Rfl: 2   ondansetron  (ZOFRAN -ODT) 4 MG disintegrating tablet, Take 1 tablet (4 mg total) by mouth every 8 (eight) hours as needed for nausea or vomiting., Disp: 20 tablet, Rfl: 0   promethazine  (PHENERGAN ) 25 MG tablet, Take 1 tablet (25 mg total) by mouth every  6 (six) hours as needed for nausea., Disp: 15 tablet, Rfl: 0   rizatriptan  (MAXALT ) 10 MG tablet, TAKE 1 TABLET BY MOUTH AS NEEDED FOR MIGRAINE. MAY REPEAT IN 2 HOURS IF NEEDED, Disp: 10 tablet, Rfl: 8   rosuvastatin  (CRESTOR ) 5 MG tablet, TAKE 1 TABLET(5 MG) BY MOUTH 2 TIMES A WEEK, Disp: 13 tablet, Rfl: 3   Semaglutide , 1 MG/DOSE, (OZEMPIC , 1 MG/DOSE,) 4 MG/3ML SOPN, Inject 1 mg into the skin once a week., Disp: 9 mL, Rfl: 3   SYRINGE-NEEDLE, DISP, 3 ML 25G X 1 3 ML MISC, Use for monthly B12 injections, Disp: 12 each, Rfl: 1   triamcinolone  cream (KENALOG ) 0.1 %, Apply 1 application topically 2 (two) times daily as needed (for itching)., Disp: 28.4 g, Rfl: 1  "

## 2024-08-16 ENCOUNTER — Ambulatory Visit (INDEPENDENT_AMBULATORY_CARE_PROVIDER_SITE_OTHER): Admitting: Sports Medicine

## 2024-08-16 ENCOUNTER — Encounter: Payer: Self-pay | Admitting: Physical Therapy

## 2024-08-16 ENCOUNTER — Ambulatory Visit (INDEPENDENT_AMBULATORY_CARE_PROVIDER_SITE_OTHER): Admitting: Physical Therapy

## 2024-08-16 ENCOUNTER — Other Ambulatory Visit: Payer: Self-pay

## 2024-08-16 VITALS — BP 130/82 | HR 114 | Ht 64.0 in | Wt 179.0 lb

## 2024-08-16 DIAGNOSIS — R202 Paresthesia of skin: Secondary | ICD-10-CM

## 2024-08-16 DIAGNOSIS — M25551 Pain in right hip: Secondary | ICD-10-CM | POA: Diagnosis not present

## 2024-08-16 DIAGNOSIS — M5442 Lumbago with sciatica, left side: Secondary | ICD-10-CM

## 2024-08-16 DIAGNOSIS — M5441 Lumbago with sciatica, right side: Secondary | ICD-10-CM | POA: Diagnosis not present

## 2024-08-16 DIAGNOSIS — M5459 Other low back pain: Secondary | ICD-10-CM | POA: Diagnosis not present

## 2024-08-16 DIAGNOSIS — M79671 Pain in right foot: Secondary | ICD-10-CM | POA: Diagnosis not present

## 2024-08-16 DIAGNOSIS — G8929 Other chronic pain: Secondary | ICD-10-CM

## 2024-08-16 DIAGNOSIS — M6281 Muscle weakness (generalized): Secondary | ICD-10-CM | POA: Diagnosis not present

## 2024-08-16 DIAGNOSIS — R200 Anesthesia of skin: Secondary | ICD-10-CM | POA: Diagnosis not present

## 2024-08-16 NOTE — Patient Instructions (Signed)
 Access Code: [email or web address] URL: [email or web address] Date: 08/16/2024 Prepared by: Elaine Daring  Exercises - Supine Sciatic Nerve Glide  - 1 x daily - 2 sets - 10 reps - Supine March with Posterior Pelvic Tilt  - 1 x daily - 2 sets - 10 reps - Bridge with Resistance  - 1 x daily - 3 sets - 5 reps - 5 seconds hold - Clam with Resistance  - 1 x daily - 3 sets - 8 reps - Sidelying Thoracic Lumbar Rotation  - 1 x daily - 10 reps - Cat Cow  - 1 x daily - 2 sets - 5 reps - Seated Passive Cervical Retraction  - 1 x daily - 2 sets - 10 reps - 5 seconds hold - Shoulder External Rotation and Scapular Retraction with Resistance  - 1 x daily - 2 sets - 10 reps - Shoulder extension with resistance - Neutral  - 1 x daily - 3 sets - 5 reps

## 2024-08-16 NOTE — Therapy (Signed)
 " OUTPATIENT PHYSICAL THERAPY TREATMENT   Patient Name: Lisa Duran MRN: [id number] DOB:[birth year], 44 y.o., female Today's Date: 08/16/2024   END OF SESSION:  PT End of Session - 08/16/24 0900     Visit Number 7    Number of Visits 17    Date for Recertification  09/09/24    Authorization Type [employer]    PT Start Time [phone]    PT Stop Time 0930    PT Time Calculation (min) 38 min    Activity Tolerance Patient tolerated treatment well    Behavior During Therapy [facility] for tasks assessed/performed                Past Medical History:  Diagnosis Date   ADHD (attention deficit hyperactivity disorder)    ALLERGIC RHINITIS    Allergy    Anemia    ANXIETY    Arthritis    left knee   Complication of anesthesia    2017 propofol  for EGD chest tightness, SOB. 2024 surgery had nystagmus after surgery   Depression    DIABETES MELLITUS, TYPE II    Dysrhythmia    Sinus Tachycardia - On Metoprolol    Fibromyalgia 2018   GERD (gastroesophageal reflux disease)    History of hiatal hernia    small hernia found on scan   History of kidney stones    MIGRAINE, COMMON    Obesity, unspecified    OVARIAN CYST    Past Surgical History:  Procedure Laterality Date   HIP ARTHROSCOPY W/ LABRAL REPAIR Right 07/2023   KNEE ARTHROSCOPY Left 08/25/2022   TOTAL KNEE ARTHROPLASTY Left 03/31/2024   Procedure: ARTHROPLASTY, KNEE, TOTAL;  Surgeon: Addie Cordella Hamilton, MD;  Location: [facility] OR;  Service: Orthopedics;  Laterality: Left;   Patient Active Problem List   Diagnosis Date Noted   Diabetes mellitus treated with injections of non-insulin  medication (HCC) 05/31/2024   Diabetes mellitus treated with oral medication (HCC) 05/31/2024   Status post left knee replacement 03/31/2024   Tear of right acetabular labrum 07/21/2023   B12 deficiency 05/09/2021   Hyperlipidemia associated with type 2 diabetes mellitus (HCC) 04/14/2019   Enteritis 01/12/2019   Type 2 diabetes mellitus with  hyperlipidemia (HCC) 01/11/2019   Sleep walking disorder 04/06/2017   Iron  deficiency anemia 03/27/2017   Fibromyalgia 02/03/2017   GERD (gastroesophageal reflux disease) 06/24/2015   Eczema 06/22/2015   Depression 06/22/2015   Tachycardia 06/16/2014   Food allergy    ADHD (attention deficit hyperactivity disorder) 01/27/2011   Anxiety 02/20/2010   Allergic rhinitis 11/21/2009   OVARIAN CYST 11/21/2009   Diabetes mellitus type 2 with neurological manifestations (HCC) 11/20/2009   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 The right hip is a little bit improved and she is doing the exercises, but doesn't feel like she is getting stronger. She reports the right lower back is feeling tight, and the upper back around the  right shoulder blade is numb and tingly that comes and goes. She did just see the doctor and is going schedule an epidural.  Eval: Patient reports right labrum repair last December, and has still been experiencing right hip and lower back pain that seems to extend to her right big toe and foot, muscle spasms, and extending up to her right shoulder blade, a numb and tingling feeling. The right big toe and foot spasms occur daily and mostly at night. The right hip and back pain feels different than the labral pain she was having before. She was doing a lot of lifting last month so that may have aggravated the back more. The left  knee is doing well, she did start trying to do kneeling at home. Currently she has difficulty and pain with sitting and driving, walking extended distances can aggravate the hip, pretty much anything more than lower level activity can aggravate right hip and back.  PERTINENT HISTORY:  Right hip arthroscopy with labral repair - 07/21/2023 Left TKA - 03/31/2024  PAIN:  Are you having pain? Yes:  NPRS scale: 2/10 at rest, 5-6/10 at worst Pain location: Right hip and back region Pain description: Right hip - ache, lower back - tight, burn, numb, tingling Aggravating factors: Sitting/driving, walking, bending, lying on right side Relieving factors: Medication  PRECAUTIONS: None  PATIENT GOALS: Pain relief, improve sitting comfort, get back to hiking   OBJECTIVE:  Note: Objective measures were completed at Evaluation unless otherwise noted. PATIENT SURVEYS:  PSFS: 3.67 Driving: 5 Exercises / strength training: 2 Hiking: 4  MUSCLE LENGTH: Right hamstring limited with pinching noted anterior hip/groin region, with addition of ankle DF reports pain down back of right leg to foot  POSTURE:   Slight rounded shoulder posture  PALPATION: Exquisite tenderness noted right glute med and max region, tenderness over greater trochanter and TFL region, tenderness right lumbar paraspinal and QL region, tenderness right lower periscapular region  LUMBAR ROM:   AROM eval 08/10/2024  Flexion WFL   Extension 50% Bilateral lower back pain 50% Bilateral lower back pain  Right lateral flexion WFL Right lower back pain   Left lateral flexion WFL Right hip pain   Right rotation 50% Right subscapular pain   Left rotation 50%  Bilateral thoracic tightness    (Blank rows = not tested)  LOWER EXTREMITY ROM:    Right hip flexion and IR limited with report of pain/pinching anterior hip and groin region  LOWER EXTREMITY MMT:    MMT Right eval Left eval Rt / Lt 08/08/2024  Hip flexion 4- 4    Hip extension 4- 4   Hip abduction 3+ 4-   Hip adduction     Hip internal rotation     Hip external rotation     Knee flexion 5 5   Knee extension 5 5   Ankle dorsiflexion   5 / 5  Ankle plantarflexion      Ankle inversion   4 / 5  Ankle eversion   5 / 5   (Blank rows = not tested)  FUNCTIONAL TESTS:  Not formally assessed at eval  GAIT: Assistive device utilized: None Level of assistance: Complete Independence Comments: Antalgic on right   TREATMENT  [facility] Adult PT Treatment:  DATE: 08/16/2024 Hooklying PPT 5 x 5 sec Bridge with blue at knees 2 x 10 x 5 sec Side clamshell x 10, with yellow 2 x 8 right Cat cow x 5 Quadruped alternating leg extension maintaining PPT x 10 Quadruped on forearms thoracic rotation with hand behind head 2 x 5 each Standing shoulder extension with scap retraction/depression and PPT using yellow 3 x 5  PATIENT EDUCATION:  Education details: HEP update Person educated: Patient Education method: Explanation, Demonstration, Tactile cues, Verbal cues, Handout Education comprehension: verbalized understanding, returned demonstration, verbal cues required, tactile cues required, and needs further education  HOME EXERCISE PROGRAM: Access Code: [email or web address]   ASSESSMENT: CLINICAL IMPRESSION: Patient tolerated therapy well with no adverse effects. Therapy focused on progressing core stabilization and hip strengthening, and spinal mobility and postural control with good tolerance. She was able to progress with banded hip strengthening this visit and more quadruped hip and core engagement. She does report greater difficulty with right scapular control and with thoracic mobility. Updated her HEP to include progression of hip strengthening and postural/core control. Patient would benefit from continued skilled PT to progress mobility and strength in order to reduce pain and maximize functional ability.   Eval:  Patient is a 44 y.o. female who was seen today for physical therapy evaluation and treatment for chronic right hip and lower/mid back pain, as well as right foot/great toe spasms. Her symptoms seem multifactorial, but primarily related to right glute med and hip intraarticular pain. She does have significant strength deficits of the right hip and also with limited hip motion due to pinching pain at the anterior hip and groin region. She does exhibit limitations in her spinal motion with tenderness of the right sided trunk musculature, and right sciatic neurodynamic deficits. She reports pain is impacting her functional ability that limit tasks such as sitting, walking, hiking, bending.  OBJECTIVE IMPAIRMENTS: Abnormal gait, decreased activity tolerance, decreased ROM, decreased strength, impaired flexibility, postural dysfunction, and pain.   ACTIVITY LIMITATIONS: lifting, sitting, standing, squatting, sleeping, stairs, locomotion level, and caring for others  PARTICIPATION LIMITATIONS: meal prep, cleaning, laundry, driving, shopping, community activity, and occupation  PERSONAL FACTORS: Fitness, Past/current experiences, and Time since onset of injury/illness/exacerbation are also affecting patient's functional outcome.    GOALS: Goals reviewed with patient? Yes  SHORT TERM GOALS: Target date: 08/12/2024  Patient will be I with initial HEP in order to progress with therapy. Baseline: HEP provided at eval 08/16/2024: independent Goal status: MET  2.  Patient will report right hip/lower back pain with activity </= 3/10 in order to reduce functional limitations Baseline: 5-6/10 08/16/2024: 5-6/10 Goal status: ONGOING  LONG TERM GOALS: Target date: 09/09/2024  Patient will be I with final HEP to maintain progress from PT. Baseline: HEP provided at eval Goal status: INITIAL  2.  Patient will report PSFS >/= 7 in order to indicate improvement in their functional ability. Baseline: 3.67 Goal  status: INITIAL  3.  Patient will demonstrate hip strength >/= 4/5 MMT in order to improve her activity tolerance and reduce pain with walking/hiking Baseline: see limitations above Goal status: INITIAL  4.  Patient will demonstrate hip PROM and lumbar AROM without increase in pain to improve her functional mobility and reduce pain with activity and sitting Baseline: see limitations above Goal status: INITIAL   PLAN: PT FREQUENCY: 2x/week  PT DURATION: 8 weeks  PLANNED INTERVENTIONS: 97164- PT Re-evaluation, 97750- Physical Performance Testing, 97110-Therapeutic exercises, 97530- Therapeutic activity, [id number]- Neuromuscular re-education, 97535- Self Care,  02859- Manual therapy, [id number]- Gait training, 79439 (1-2 muscles), 20561 (3+ muscles)- Dry Needling, Patient/Family education, Balance training, Stair training, Taping, Joint mobilization, Joint manipulation, Spinal manipulation, Spinal mobilization, Cryotherapy, and Moist heat.  PLAN FOR NEXT SESSION: Review HEP and progress PRN, manual/TPDN for right gluteal and lumbar region, mobs/LAD for right hip and improve lumbar and thoracic mobility, progress core stabilization and hip strengthening as tolerated   Elaine Daring, PT, DPT, LAT, ATC 08/16/2024  9:42 AM Phone: [phone] Fax: [phone]   "

## 2024-08-16 NOTE — Patient Instructions (Signed)
 Continue PT   Epidural right L2-3  2 week follow up

## 2024-08-18 ENCOUNTER — Encounter: Admitting: Physical Therapy

## 2024-08-19 ENCOUNTER — Encounter: Payer: Self-pay | Admitting: Sports Medicine

## 2024-08-22 NOTE — Discharge Instructions (Signed)

## 2024-08-23 ENCOUNTER — Inpatient Hospital Stay: Admission: RE | Admit: 2024-08-23 | Discharge: 2024-08-23 | Attending: Sports Medicine

## 2024-08-23 DIAGNOSIS — M79671 Pain in right foot: Secondary | ICD-10-CM

## 2024-08-23 DIAGNOSIS — G8929 Other chronic pain: Secondary | ICD-10-CM

## 2024-08-23 DIAGNOSIS — M25551 Pain in right hip: Secondary | ICD-10-CM

## 2024-08-23 MED ORDER — IOPAMIDOL (ISOVUE-M 200) INJECTION 41%
1.0000 mL | Freq: Once | INTRAMUSCULAR | Status: AC
  Administered 2024-08-23: 1 mL via EPIDURAL

## 2024-08-23 MED ORDER — METHYLPREDNISOLONE ACETATE 40 MG/ML INJ SUSP (RADIOLOG
80.0000 mg | Freq: Once | INTRAMUSCULAR | Status: AC
  Administered 2024-08-23: 80 mg via EPIDURAL

## 2024-08-30 ENCOUNTER — Encounter: Payer: Self-pay | Admitting: Internal Medicine

## 2024-08-30 ENCOUNTER — Ambulatory Visit: Admitting: Sports Medicine

## 2024-08-30 ENCOUNTER — Ambulatory Visit: Admitting: Internal Medicine

## 2024-08-30 VITALS — BP 120/68 | HR 123 | Ht 64.0 in | Wt 173.6 lb

## 2024-08-30 DIAGNOSIS — Z7985 Long-term (current) use of injectable non-insulin antidiabetic drugs: Secondary | ICD-10-CM

## 2024-08-30 DIAGNOSIS — E663 Overweight: Secondary | ICD-10-CM

## 2024-08-30 DIAGNOSIS — E66811 Obesity, class 1: Secondary | ICD-10-CM | POA: Diagnosis not present

## 2024-08-30 DIAGNOSIS — Z7984 Long term (current) use of oral hypoglycemic drugs: Secondary | ICD-10-CM

## 2024-08-30 DIAGNOSIS — E1149 Type 2 diabetes mellitus with other diabetic neurological complication: Secondary | ICD-10-CM

## 2024-08-30 DIAGNOSIS — Z6829 Body mass index (BMI) 29.0-29.9, adult: Secondary | ICD-10-CM | POA: Diagnosis not present

## 2024-08-30 DIAGNOSIS — E785 Hyperlipidemia, unspecified: Secondary | ICD-10-CM

## 2024-08-30 LAB — POCT GLYCOSYLATED HEMOGLOBIN (HGB A1C): Hemoglobin A1C: 6.9 % — AB (ref 4.0–5.6)

## 2024-08-30 MED ORDER — ACCU-CHEK SOFTCLIX LANCETS MISC: 3 refills | Status: AC

## 2024-08-30 MED ORDER — GLUCOSE BLOOD VI STRP: ORAL_STRIP | 3 refills | Status: AC

## 2024-08-30 MED ORDER — ACCU-CHEK GUIDE W/DEVICE KIT: PACK | 0 refills | Status: AC

## 2024-08-30 NOTE — Progress Notes (Unsigned)
 "               Odis Mace D.CLEMENTEEN [name] [address] [place] [address] Phone: [phone]   Assessment and Plan:     1. Chronic right-sided low back pain with bilateral sciatica (Primary) 2. Numbness and tingling of both legs 3. Right hip pain 4. Right foot pain 5. Chronic pain syndrome 6. Other fatigue 7.  Fibromyalgia -Chronic with exacerbation, subsequent visit - Continued low back pain, right hip pain, right foot pain, numbness and tingling in right lower extremity, intermittent left lower extremity numbness and tingling.  Currently unclear etiology of ongoing symptoms. - Patient had mild improvement in range of motion of low back after epidural CSI at right sided L2-L3 on 08/23/2024, however only experience 10% improvement in pain.  This likely indicates that lumbar radiculopathy is not patient's primary pain generator - Lumbar MRI 02/25/2024 only showed mild disc bulging at L2-L3 which does not perfectly fit patient's paresthesia pattern  - Patient prefers to not use gabapentin  due to increased risk of dementia -Of note patient had small labral tear seen on MRI 05/14/2023 and corrected with right hip arthroscopy on 07/21/2023 -Meloxicam  course only provided mild relief.  Use meloxicam  15 mg daily as needed for breakthrough pain.  Recommend limiting chronic NSAIDs to 1-2 doses per week to prevent long-term side effects. Use Tylenol  500 to 1000 mg tablets 2-3 times a day as needed for day-to-day pain relief.    -Recommend further evaluation for alternative causes of patient's ongoing pain.  Will obtain lab work at today's visit to rule out autoimmune causes.  Will order EMG to look for neurologic cause. - Patient recently restarted Cymbalta  30 mg.  Could consider increasing Cymbalta  to better control chronic pain  Pertinent previous records reviewed include epidural CSI note   Follow Up: 1 week after EMG to review results and review lab work.  If no  significant findings, differential of fibromyalgia, complex regional pain syndrome would be most likely.  Could discuss ongoing physical therapy, increasing Cymbalta , pain management as treatment options   Subjective:   I, Christiano Blandon, am serving as a neurosurgeon for Doctor [employer]   Chief Complaint: Left knee pain    HPI:    05/21/23 Patient states left knee pain and continued right hip pain    09/14/23 Today patient states Left knee is in a lot of pain that started 2 weeks ago. Been going in PT for her hip labrel tear repair surgery and the PT thought it was a good idea to be seen. Patient states it feels like it did when she tore her meniscus. The right knee is starting to hurt as well. Pain is sharp and shooting. Has to use crutches sometimes. Also doing RICE   11/06/2023 Patient states ready for gel injection. Would like to discuss right foot was told to ask from endoc   11/17/2023 Patient states knee pain got worse after gel. Knee feels loose, swelling, and decreased ROM    12/10/2023 Patient states that the knee is not happy today. The meloxicam  has helped some but the sharp pain is still there and waking her up at night.    02/11/2024 Patient states knees are not happy today. The brace is helping but it has progressed. Pain in hip is radiating. It is ping-ponging around. It feels like when she had the labral tear.    07/05/2024 Patient states right hip pain that radiates up the back  and down the leg. Has been doing increased activity. Thoracic back pain with numbness when she does activity. Hip pain when driving. Low back is tight and painful. Right foot tingling is every night now.    08/16/2024 Patient states pain in low back. Foot tingling and spasm still present and getting worse and radiating up to her knee . Tingling and spasm is now in the left foot as well. Low back is not as flared but pain is still present   08/31/2024 Patient states back and hip are sore, she has  more ROM and is able to lay on her side longer. Knows it can take up to 2 weeks. Muscles spasms are still the same.thoracic back is still flared   Relevant Historical Information: DM type ll  Additional pertinent review of systems negative.  Current Medications[1]   Objective:     Vitals:   08/31/24 1351  BP: 120/68  Pulse: (!) 117  SpO2: 98%  Weight: 173 lb (78.5 kg)  Height: 5' 4 (1.626 m)      Body mass index is 29.7 kg/m.    Physical Exam:    Gen: Appears well, nad, nontoxic and pleasant Psych: Alert and oriented, appropriate mood and affect Neuro: Decreased sensation over right lateral thigh compared to left.  Numb/tingling sensation over bilateral feet.  Strength is 5/5 in upper and lower extremities, muscle tone wnl Skin: no susupicious lesions or rashes   Back - Normal skin, Spine with normal alignment and no deformity.   No tenderness to vertebral process palpation.   Bilateral thoracic and lumbar paraspinous muscles are mildly tender and without spasm NTTP gluteal musculature Straight leg raise negative Trendelenberg negative  Gait normal   Right hip: No deformity, swelling or wasting ROM Flexion 90, ext 30, IR 35, ER 45 NTTP over the hip flexors, greater trochanter, gluteal musculature, si joint, lumbar spine Negative log roll with FROM Negative FABER Positive FADIR    Electronically signed by:  Odis Mace D.[name] 2:52 PM 08/31/24     [1]  Current Outpatient Medications:    ALPRAZolam  (XANAX ) 0.5 MG tablet, TAKE 1 TABLET(0.5 MG) BY MOUTH THREE TIMES DAILY AS NEEDED FOR ANXIETY, Disp: 60 tablet, Rfl: 5   amoxicillin  (AMOXIL ) 500 MG tablet, Take 2g (#4 500mg  tablets) one hour prior to dental procedure., Disp: 12 tablet, Rfl: 0   amphetamine -dextroamphetamine  (ADDERALL) 20 MG tablet, Take 1 tablet (20 mg total) by mouth 2 (two) times daily., Disp: 180 tablet, Rfl: 0   cyanocobalamin  (VITAMIN B12) 1000 MCG/ML injection,  INJECT INTRAMUSCULARLY 1 ML  EVERY 30 DAYS (DISCARD 28 DAYS  AFTER FIRST USE), Disp: 3 mL, Rfl: 2   cyclobenzaprine  (FLEXERIL ) 5 MG tablet, Take 1 tablet (5 mg total) by mouth at bedtime as needed for muscle spasms., Disp: 30 tablet, Rfl: 3   DULoxetine  (CYMBALTA ) 20 MG capsule, Take 1 capsule (20 mg total) by mouth daily., Disp: 90 capsule, Rfl: 1   EPINEPHrine  0.3 mg/0.3 mL IJ SOAJ injection, Use as needed for hypersensitivity reaction, Disp: 1 each, Rfl: 0   fexofenadine (ALLEGRA) 60 MG tablet, Take 60 mg by mouth daily as needed (Allergies)., Disp: , Rfl:    levonorgestrel  (MIRENA ) 20 MCG/24HR IUD, 1 Intra Uterine Device (1 each total) by Intrauterine route once., Disp: 1 each, Rfl: 0   meloxicam  (MOBIC ) 15 MG tablet, Take 1 tablet daily for 2 weeks.  If still in pain after 2 weeks, take 1 tablet daily for an additional  1 week., Disp: 30 tablet, Rfl: 0   metFORMIN  (GLUCOPHAGE ) 1000 MG tablet, TAKE 2 TABLETS BY MOUTH DAILY WITH SUPPER., Disp: 180 tablet, Rfl: 3   metoprolol  succinate (TOPROL -XL) 25 MG 24 hr tablet, TAKE 1 TABLET BY MOUTH DAILY, Disp: 90 tablet, Rfl: 3   omeprazole  (PRILOSEC) 40 MG capsule, TAKE 1 CAPSULE BY MOUTH DAILY BEFORE SUPPER, Disp: 90 capsule, Rfl: 2   ondansetron  (ZOFRAN -ODT) 4 MG disintegrating tablet, Take 1 tablet (4 mg total) by mouth every 8 (eight) hours as needed for nausea or vomiting., Disp: 20 tablet, Rfl: 0   promethazine  (PHENERGAN ) 25 MG tablet, Take 1 tablet (25 mg total) by mouth every 6 (six) hours as needed for nausea., Disp: 15 tablet, Rfl: 0   rizatriptan  (MAXALT ) 10 MG tablet, TAKE 1 TABLET BY MOUTH AS NEEDED FOR MIGRAINE. MAY REPEAT IN 2 HOURS IF NEEDED, Disp: 10 tablet, Rfl: 8   rosuvastatin  (CRESTOR ) 5 MG tablet, TAKE 1 TABLET(5 MG) BY MOUTH 2 TIMES A WEEK, Disp: 13 tablet, Rfl: 3   Semaglutide , 1 MG/DOSE, (OZEMPIC , 1 MG/DOSE,) 4 MG/3ML SOPN, Inject 1 mg into the skin once a week., Disp: 9 mL, Rfl: 3   SYRINGE-NEEDLE, DISP, 3 ML 25G X 1 3 ML MISC,  Use for monthly B12 injections, Disp: 12 each, Rfl: 1   triamcinolone  cream (KENALOG ) 0.1 %, Apply 1 application topically 2 (two) times daily as needed (for itching)., Disp: 28.4 g, Rfl: 1   Accu-Chek Softclix Lancets lancets, Use as instructed 1x a day, Disp: 100 each, Rfl: 3   Blood Glucose Monitoring Suppl (ACCU-CHEK GUIDE) w/Device KIT, Use as advised, Disp: 1 kit, Rfl: 0   glucose blood test strip, Use as instructed 1x a day, Disp: 100 each, Rfl: 3  "

## 2024-08-30 NOTE — Progress Notes (Signed)
 =ubjective:     Patient ID: Lisa Duran, female   DOB: 09/10/1980, 44 y.o.   MRN: [id number]  HPI Lisa Duran is a pleasant 44 y.o.  woman returning for f/u for DM2, non insulin  dependent now, uncontrolled, dx 05/2009, w/o complications. Last visit 6 months ago.  Interim history: No increased urination, blurry vision, nausea, chest pain. She lost approximately 70 pounds after starting  Ozempic .  She gained 13 pounds after having had a torn meniscus and knee surgery in 08/2022.  Afterwards, she developed right hip pain due to tear of right acetabular labrum >> she had surgery 07/2023 and then knee surgery 08/2023. She was not able to walk consistently - was in PT then got gel knee injections but they did not work.  Since last visit she had a left knee replacement 03/31/2024. She is still in PT. She had a spinal steroid inj for sciatica due to a bulged disk. Seeing Sports Medicine.  She also has significant stress: She lost her job and her husband is sick and out of work.  Her mother has Alzheimer's and she is in a SNF.  She also mentions that she may be without insurance for a period of time.  Reviewed HbA1c levels: Lab Results  Component Value Date   HGBA1C 6.7 (H) 03/29/2024   HGBA1C 7.1 (A) 03/10/2024   HGBA1C 6.8 (A) 11/04/2023   HGBA1C 6.7 (H) 05/22/2023   HGBA1C 6.4 03/02/2023   HGBA1C 6.5 (A) 10/31/2022   HGBA1C 6.9 (A) 06/27/2022   HGBA1C 7.0 (A) 02/20/2022   HGBA1C 6.9 (A) 10/08/2021   HGBA1C 7.6 (A) 07/01/2021   She is on:   - Metformin  1000 mg 2x a day  - Ozempic  0.5 mg weekly - After starting Ozempic , she had constipation, gas, nausea, but these improved.  She continues on Pepcid  and Mylanta prn >> 1 mg weekly We stopped glipizide  ER and also Januvia . We tried Cycloset  0.8 mg daily >> stopped end of 11/2016 b/c GERD/AP/C We tried Trulicity  >> could not tolerate it: N/V/AP. She restarted Januvia  09/2016. She was on Invokana  100 mg in am (started 12/2014) >> 2 yeast inf >> tx with  Diflucan ; she was exhausted and had nocturia >> stopped 03/12/2015 Tried Victoza  2 mo ago >> AP, severe GERD, inj site rxn Tried Januvia  >> tolerated it well, but stopped when Invokana  was suggested.  She was also on Actos, but taken off b/c good control. She had problems tolerating insulins in the past.  She was previously on Lantus  and NovoLog .  Basaglar  and Tresiba  caused eczema and also GI symptoms.  We then switched to NPH (approximately 50 units a day) and regular insulin  but she was able to come off due to good control after starting Ozempic :  She has a One Touch Verio meter >> True Metrix.  She was check her sugars 0-1x a day: - am: 105-145, 157 >> 114-140 >> 110-140 (usu. 120) - 2h after b'fast: n/c  - lunch: 114, 134 >> n/c - 2h after lunch: n/c - dinner: 126 >> n/c - 2h after dinner: n/c >> 185-190 >> 170-180 - bedtime: n/c  Prev.  On the CGM, but not covered anymore due to not being on insulin .   Prev.:  Lowest: 97 >> 114 Highest: 190 >> 190 She has hypoglycemia awareness in the 80s.  No CKD: Lab Results  Component Value Date   BUN 6 03/29/2024   Lab Results  Component Value Date   CREATININE 0.69 03/29/2024  No MAU: Lab Results  Component Value Date   MICRALBCREAT 4 03/10/2024   MICRALBCREAT 28 05/09/2020   MICRALBCREAT 25.1 11/20/2009   + HL: Lab Results  Component Value Date   CHOL 171 11/20/2023   HDL 77.70 11/20/2023   LDLCALC 74 11/20/2023   TRIG 97.0 11/20/2023   CHOLHDL 2 11/20/2023  On Crestor  5 mg 2x a week.  - Last eye exam 2025: reportedly No DR, IOP high - [facility].  - Resolved numbness and tingling in hand and feet.  She felt much better on Cymbalta  and Neurontin , but she is now off both -no return of symptoms.  Last foot exam 11/04/2023.  She had CP >> had a stress test 07/04/2015 >>  Normal.  She is on metoprolol  for tachycardia. She has fibromyalgia.   She also has a history of iron  deficiency anemia and had iron  infusions.      Review of Systems + see HPI  Past Medical History:  Diagnosis Date   ADHD (attention deficit hyperactivity disorder)    ALLERGIC RHINITIS    Allergy    Anemia    ANXIETY    Arthritis    left knee   Complication of anesthesia    2017 propofol  for EGD chest tightness, SOB. 2024 surgery had nystagmus after surgery   Depression    DIABETES MELLITUS, TYPE II    Dysrhythmia    Sinus Tachycardia - On Metoprolol    Fibromyalgia 2018   GERD (gastroesophageal reflux disease)    History of hiatal hernia    small hernia found on scan   History of kidney stones    MIGRAINE, COMMON    Obesity, unspecified    OVARIAN CYST    Past Surgical History:  Procedure Laterality Date   HIP ARTHROSCOPY W/ LABRAL REPAIR Right 07/2023   KNEE ARTHROSCOPY Left 08/25/2022   TOTAL KNEE ARTHROPLASTY Left 03/31/2024   Procedure: ARTHROPLASTY, KNEE, TOTAL;  Surgeon: Addie Cordella Hamilton, MD;  Location: [facility] OR;  Service: Orthopedics;  Laterality: Left;   Social History   Socioeconomic History   Marital status: Married    Spouse name: Not on file   Number of children: Not on file   Years of education: Not on file   Highest education level: Bachelor's degree (e.g., BA, AB, BS)  Occupational History   Not on file  Tobacco Use   Smoking status: Former    Current packs/day: 0.00    Types: Cigarettes    Quit date: 08/20/2002    Years since quitting: 22.0   Smokeless tobacco: Never   Tobacco comments:    single, separated from spouse 02/2010.   Vaping Use   Vaping status: Never Used  Substance and Sexual Activity   Alcohol use: Yes    Comment: maybe one drink per month   Drug use: No   Sexual activity: Not Currently    Birth control/protection: I.U.D.  Other Topics Concern   Not on file  Social History Narrative   Single, separated from spouse 02/2010   Social Drivers of Health   Tobacco Use: Medium Risk (08/16/2024)   Patient History    Smoking Tobacco Use: Former    Smokeless Tobacco Use:  Never    Passive Exposure: Not on file  Financial Resource Strain: Low Risk (05/30/2024)   Overall Financial Resource Strain (CARDIA)    Difficulty of Paying Living Expenses: Not very hard  Food Insecurity: No Food Insecurity (05/30/2024)   Epic    Worried About Running Out of  Food in the Last Year: Never true    Ran Out of Food in the Last Year: Never true  Transportation Needs: No Transportation Needs (05/30/2024)   Epic    Lack of Transportation (Medical): No    Lack of Transportation (Non-Medical): No  Physical Activity: Unknown (05/30/2024)   Exercise Vital Sign    Days of Exercise per Week: Patient declined    Minutes of Exercise per Session: Not on file  Stress: Stress Concern Present (05/30/2024)   [employer] of Occupational Health - Occupational Stress Questionnaire    Feeling of Stress: Rather much  Social Connections: Socially Integrated (05/30/2024)   Social Connection and Isolation Panel    Frequency of Communication with Friends and Family: Twice a week    Frequency of Social Gatherings with Friends and Family: Once a week    Attends Religious Services: More than 4 times per year    Active Member of [employer] or Organizations: Yes    Attends Banker Meetings: More than 4 times per year    Marital Status: Married  Catering Manager Violence: Not on file  Depression (PHQ2-9): Low Risk (11/20/2023)   Depression (PHQ2-9)    PHQ-2 Score: 0  Alcohol Screen: Low Risk (05/30/2024)   Alcohol Screen    Last Alcohol Screening Score (AUDIT): 1  Housing: Low Risk (05/30/2024)   Epic    Unable to Pay for Housing in the Last Year: No    Number of Times Moved in the Last Year: 0    Homeless in the Last Year: No  Utilities: Not on file  Health Literacy: Not on file   Current Outpatient Medications on File Prior to Visit  Medication Sig Dispense Refill   ALPRAZolam  (XANAX ) 0.5 MG tablet TAKE 1 TABLET(0.5 MG) BY MOUTH THREE TIMES DAILY AS NEEDED FOR ANXIETY 60  tablet 5   amoxicillin  (AMOXIL ) 500 MG tablet Take 2g (#4 500mg  tablets) one hour prior to dental procedure. 12 tablet 0   amphetamine -dextroamphetamine  (ADDERALL) 20 MG tablet Take 1 tablet (20 mg total) by mouth 2 (two) times daily. 180 tablet 0   cyanocobalamin  (VITAMIN B12) 1000 MCG/ML injection INJECT INTRAMUSCULARLY 1 ML  EVERY 30 DAYS (DISCARD 28 DAYS  AFTER FIRST USE) 3 mL 2   cyclobenzaprine  (FLEXERIL ) 5 MG tablet Take 1 tablet (5 mg total) by mouth at bedtime as needed for muscle spasms. 30 tablet 3   DULoxetine  (CYMBALTA ) 20 MG capsule Take 1 capsule (20 mg total) by mouth daily. 90 capsule 1   EPINEPHrine  0.3 mg/0.3 mL IJ SOAJ injection Use as needed for hypersensitivity reaction 1 each 0   fexofenadine (ALLEGRA) 60 MG tablet Take 60 mg by mouth daily as needed (Allergies).     glucose blood (ONETOUCH VERIO) test strip Use 2x a day 200 each 3   levonorgestrel  (MIRENA ) 20 MCG/24HR IUD 1 Intra Uterine Device (1 each total) by Intrauterine route once. 1 each 0   meloxicam  (MOBIC ) 15 MG tablet Take 1 tablet daily for 2 weeks.  If still in pain after 2 weeks, take 1 tablet daily for an additional 1 week. 30 tablet 0   metFORMIN  (GLUCOPHAGE ) 1000 MG tablet TAKE 2 TABLETS BY MOUTH DAILY WITH SUPPER. 180 tablet 3   metoprolol  succinate (TOPROL -XL) 25 MG 24 hr tablet TAKE 1 TABLET BY MOUTH DAILY 90 tablet 3   omeprazole  (PRILOSEC) 40 MG capsule TAKE 1 CAPSULE BY MOUTH DAILY BEFORE SUPPER 90 capsule 2   ondansetron  (ZOFRAN -ODT) 4 MG disintegrating tablet  Take 1 tablet (4 mg total) by mouth every 8 (eight) hours as needed for nausea or vomiting. 20 tablet 0   promethazine  (PHENERGAN ) 25 MG tablet Take 1 tablet (25 mg total) by mouth every 6 (six) hours as needed for nausea. 15 tablet 0   rizatriptan  (MAXALT ) 10 MG tablet TAKE 1 TABLET BY MOUTH AS NEEDED FOR MIGRAINE. MAY REPEAT IN 2 HOURS IF NEEDED 10 tablet 8   rosuvastatin  (CRESTOR ) 5 MG tablet TAKE 1 TABLET(5 MG) BY MOUTH 2 TIMES A WEEK 13  tablet 3   Semaglutide , 1 MG/DOSE, (OZEMPIC , 1 MG/DOSE,) 4 MG/3ML SOPN Inject 1 mg into the skin once a week. 9 mL 3   SYRINGE-NEEDLE, DISP, 3 ML 25G X 1 3 ML MISC Use for monthly B12 injections 12 each 1   triamcinolone  cream (KENALOG ) 0.1 % Apply 1 application topically 2 (two) times daily as needed (for itching). 28.4 g 1   No current facility-administered medications on file prior to visit.   Allergies  Allergen Reactions   Chicken Allergy Anaphylaxis and Shortness Of Breath   Covid-19 Mrna Vacc (Moderna) Anaphylaxis   Other Itching, Swelling and Other (See Comments)    NUTS (of ANY kind): Lips and mouth swell, but no breathing impairment & migraines and eczema are triggered   Propofol  Shortness Of Breath    SOB, chest tightness, wheezing after EGD on 2017  Received propofol  for surgery 07/21/23 without complication   Basaglar  Kwikpen [Insulin  Glargine] Palpitations and Hypertension   Adhesive [Tape] Other (See Comments)    Makes the skin VERY RED    Gluten Meal Diarrhea   Peanut-Containing Drug Products Itching, Swelling and Other (See Comments)    Lips and mouth swell, but no breathing impairment & migraines and eczema are triggered    Novolog  [Insulin  Aspart (Human Analog) (Yeast)] Rash   Tresiba  [Insulin  Degludec] Rash    eczema   Victoza  [Liraglutide ] Rash   Family History  Problem Relation Age of Onset   Diabetes Mother    Hyperlipidemia Mother    CAD Mother    CVA Mother    Diabetes Father    Hyperlipidemia Father    Coronary artery disease Father    Hypertension Father    Cancer Father        esoph   Stroke Father    Cancer Maternal Grandfather 69       Prostate and Lung   Diabetes Sister        Gestational   Graves' disease Maternal Aunt    Cancer Maternal Uncle        Brain   Stroke Paternal Grandmother    Heart disease Paternal Grandmother    Stroke Paternal Grandfather    Heart disease Paternal Grandfather    Diabetes Sister        Gestational  and Type II   Hashimoto's thyroiditis Cousin    Cancer Maternal Uncle        Brain   Objective:   Physical Exam BP 120/68   Pulse (!) 123   Ht 5' 4 (1.626 m)   Wt 173 lb 9.6 oz (78.7 kg)   SpO2 98%   BMI 29.80 kg/m   Wt Readings from Last 15 Encounters:  08/30/24 173 lb 9.6 oz (78.7 kg)  08/16/24 179 lb (81.2 kg)  07/05/24 177 lb (80.3 kg)  05/31/24 179 lb (81.2 kg)  03/31/24 189 lb (85.7 kg)  03/29/24 189 lb (85.7 kg)  03/10/24 190 lb 3.2 oz (86.3 kg)  02/11/24 191 lb (86.6 kg)  12/10/23 190 lb 3.2 oz (86.3 kg)  11/20/23 186 lb (84.4 kg)  11/17/23 187 lb (84.8 kg)  11/06/23 187 lb (84.8 kg)  11/04/23 187 lb 12.8 oz (85.2 kg)  07/21/23 178 lb (80.7 kg)  07/16/23 179 lb 3.2 oz (81.3 kg)   Constitutional: overweight, in NAD Eyes: EOMI, no exophthalmos ENT: no thyromegaly, no cervical lymphadenopathy Cardiovascular: tachycardia, RR, No MRG Respiratory: CTA B Musculoskeletal: no deformities Skin: no rashes Neurological: no tremor with outstretched hands Diabetic Foot Exam - Simple   Simple Foot Form Diabetic Foot exam was performed with the following findings: Yes 08/30/2024  9:33 AM  Visual Inspection No deformities, no ulcerations, no other skin breakdown bilaterally: Yes Sensation Testing Intact to touch and monofilament testing bilaterally: Yes Pulse Check Posterior Tibialis and Dorsalis pulse intact bilaterally: Yes Comments    Assessment:     1. DM2, now non insulin -dependent, uncontrolled, without complications - r/o autoimmunity Component     Latest Ref Rng 08/17/2012  Glutamic Acid Decarb Ab     <=1.0 U/mL <1.0  Pancreatic Islet Cell Antibody     < 5 JDF Units <5  C-Peptide     0.80 - 3.90 ng/mL 1.84  Glucose     70 - 99 mg/dL 895 (H)     Component     Latest Ref Rng 10/31/2022  Glucose     65 - 99 mg/dL 84   C-Peptide     9.19 - 3.85 ng/mL 1.47    2. Obesity class 1  3. HL  Plan:     1. Patient with longstanding, previously  uncontrolled type 2 diabetes, on insulin  in the past but now off due to improved control.  At last visit, she was not very active due to knee pain and sugars are higher.  HbA1c was 7.1%.  We increased the dose of her Ozempic .  Since last visit, she had left knee surgery 5 months ago and she recovered well. - She had another HbA1c obtained 5 months ago and this was lower, at 6.7%. - At today's visit, she is not checking sugars consistently, but they are stable from last visit, whenever checked, with maybe slight improvement after dinner.  Since she is in danger of losing her insurance, we discussed about how to extend her Ozempic  supply more.  I advised her that she may need to reduce the dose from 1 mg weekly to 0.5 mg weekly for a period of time.  We can continue metformin  for now. - I suggested to:  Patient Instructions  Please continue: - Metformin  1000 mg 2x a day with meals  Use: - Ozempic  0.5-1 mg weekly  On the Ozempic  1 mg pen: - 18 clicks  - 0.25 mg - 36 clicks - 0.5 mg - 54 clicks - 0.75 mg - 72 clicks - 1 mg  Please return in 4 months.  - we checked her HbA1c: 6.9% (higher) - advised to check sugars at different times of the day - 1x a day, rotating check times - advised for yearly eye exams >> she is UTD - return to clinic in 4 months  2. Obesity class 1 -Ozempic  was initially poorly tolerated but she then started to tolerate it better and now has no consistent side effects from it -She lost approximately 70 pounds after starting Ozempic  but then gained gained 9 pounds due to inactivity related to her surgeries. - At last visit, her weight was approximately stable -  She lost 17 pounds since last visit  3. HL - Latest lipid panel was at goal: Lab Results  Component Value Date   CHOL 171 11/20/2023   HDL 77.70 11/20/2023   LDLCALC 74 11/20/2023   TRIG 97.0 11/20/2023   CHOLHDL 2 11/20/2023  - She continues on Crestor  5 mg twice a week.  She has some muscle and joint  aches but she attributes this to her fibromyalgia.  Lela Fendt, MD PhD [facility] Endocrinology

## 2024-08-30 NOTE — Patient Instructions (Addendum)
 Please continue: - Metformin  1000 mg 2x a day with meals  Use: - Ozempic  0.5-1 mg weekly  On the Ozempic  1 mg pen: - 18 clicks  - 0.25 mg - 36 clicks - 0.5 mg - 54 clicks - 0.75 mg - 72 clicks - 1 mg  Please return in 4 months.

## 2024-08-31 ENCOUNTER — Ambulatory Visit: Admitting: Sports Medicine

## 2024-08-31 VITALS — BP 120/68 | HR 117 | Ht 64.0 in | Wt 173.0 lb

## 2024-08-31 DIAGNOSIS — M797 Fibromyalgia: Secondary | ICD-10-CM

## 2024-08-31 DIAGNOSIS — M79671 Pain in right foot: Secondary | ICD-10-CM

## 2024-08-31 DIAGNOSIS — M5441 Lumbago with sciatica, right side: Secondary | ICD-10-CM

## 2024-08-31 DIAGNOSIS — R200 Anesthesia of skin: Secondary | ICD-10-CM

## 2024-08-31 DIAGNOSIS — R202 Paresthesia of skin: Secondary | ICD-10-CM

## 2024-08-31 DIAGNOSIS — G894 Chronic pain syndrome: Secondary | ICD-10-CM

## 2024-08-31 DIAGNOSIS — R5383 Other fatigue: Secondary | ICD-10-CM

## 2024-08-31 DIAGNOSIS — G8929 Other chronic pain: Secondary | ICD-10-CM | POA: Diagnosis not present

## 2024-08-31 DIAGNOSIS — M5442 Lumbago with sciatica, left side: Secondary | ICD-10-CM | POA: Diagnosis not present

## 2024-08-31 DIAGNOSIS — M25551 Pain in right hip: Secondary | ICD-10-CM | POA: Diagnosis not present

## 2024-08-31 LAB — SEDIMENTATION RATE: Sed Rate: 50 mm/h — ABNORMAL HIGH (ref 0–20)

## 2024-08-31 LAB — URIC ACID: Uric Acid, Serum: 4.7 mg/dL (ref 2.4–7.0)

## 2024-08-31 LAB — FERRITIN: Ferritin: 12 ng/mL (ref 10.0–291.0)

## 2024-08-31 LAB — VITAMIN D 25 HYDROXY (VIT D DEFICIENCY, FRACTURES): VITD: 23.59 ng/mL — ABNORMAL LOW (ref 30.00–100.00)

## 2024-08-31 LAB — C-REACTIVE PROTEIN: CRP: <0.5 mg/dL — ABNORMAL LOW (ref 1.0–20.0)

## 2024-08-31 LAB — TSH: TSH: 1.86 u[IU]/mL (ref 0.35–5.50)

## 2024-08-31 NOTE — Patient Instructions (Addendum)
 Nerve conduction study   Labs on the way out  Call and follow up  1 week after nerve conduction study to discuss results

## 2024-09-01 LAB — CYCLIC CITRUL PEPTIDE ANTIBODY, IGG: Cyclic Citrullin Peptide Ab: <16 U

## 2024-09-01 LAB — RHEUMATOID FACTOR: Rheumatoid fact SerPl-aCnc: <10 [IU]/mL

## 2024-09-02 ENCOUNTER — Ambulatory Visit: Payer: Self-pay | Admitting: Sports Medicine

## 2024-09-02 LAB — ANA+ENA+DNA/DS+SCL 70+SJOSSA/B
ANA Titer 1: NEGATIVE
ENA RNP Ab: 0.5 AI (ref 0.0–0.9)
ENA SM Ab Ser-aCnc: <0.2 AI (ref 0.0–0.9)
ENA SSA (RO) Ab: 0.2 AI (ref 0.0–0.9)
ENA SSB (LA) Ab: 0.5 AI (ref 0.0–0.9)
Scleroderma (Scl-70) (ENA) Antibody, IgG: <0.2 AI (ref 0.0–0.9)
dsDNA Ab: 2 [IU]/mL (ref 0–9)

## 2024-11-29 ENCOUNTER — Ambulatory Visit: Admitting: Internal Medicine

## 2024-12-28 ENCOUNTER — Ambulatory Visit: Admitting: Internal Medicine
